# Patient Record
Sex: Female | Born: 1935
Health system: Southern US, Community
[De-identification: ages and names within clinical notes are randomized; demographics above are authoritative.]

## PROBLEM LIST (undated history)

## (undated) DIAGNOSIS — T8859XA Other complications of anesthesia, initial encounter: Secondary | ICD-10-CM

## (undated) DIAGNOSIS — M5136 Other intervertebral disc degeneration, lumbar region: Secondary | ICD-10-CM

## (undated) DIAGNOSIS — I4892 Unspecified atrial flutter: Secondary | ICD-10-CM

## (undated) DIAGNOSIS — K5790 Diverticulosis of intestine, part unspecified, without perforation or abscess without bleeding: Secondary | ICD-10-CM

## (undated) DIAGNOSIS — I10 Essential (primary) hypertension: Secondary | ICD-10-CM

## (undated) DIAGNOSIS — M545 Low back pain, unspecified: Secondary | ICD-10-CM

## (undated) DIAGNOSIS — M51369 Other intervertebral disc degeneration, lumbar region without mention of lumbar back pain or lower extremity pain: Secondary | ICD-10-CM

## (undated) DIAGNOSIS — M1710 Unilateral primary osteoarthritis, unspecified knee: Secondary | ICD-10-CM

## (undated) DIAGNOSIS — I350 Nonrheumatic aortic (valve) stenosis: Secondary | ICD-10-CM

## (undated) DIAGNOSIS — J42 Unspecified chronic bronchitis: Secondary | ICD-10-CM

## (undated) DIAGNOSIS — R609 Edema, unspecified: Secondary | ICD-10-CM

## (undated) DIAGNOSIS — I5032 Chronic diastolic (congestive) heart failure: Secondary | ICD-10-CM

## (undated) DIAGNOSIS — M1990 Unspecified osteoarthritis, unspecified site: Secondary | ICD-10-CM

## (undated) DIAGNOSIS — G4700 Insomnia, unspecified: Secondary | ICD-10-CM

## (undated) DIAGNOSIS — M179 Osteoarthritis of knee, unspecified: Secondary | ICD-10-CM

## (undated) DIAGNOSIS — R410 Disorientation, unspecified: Secondary | ICD-10-CM

## (undated) DIAGNOSIS — I499 Cardiac arrhythmia, unspecified: Secondary | ICD-10-CM

## (undated) DIAGNOSIS — R600 Localized edema: Secondary | ICD-10-CM

## (undated) DIAGNOSIS — R42 Dizziness and giddiness: Secondary | ICD-10-CM

## (undated) DIAGNOSIS — S3210XA Unspecified fracture of sacrum, initial encounter for closed fracture: Secondary | ICD-10-CM

## (undated) DIAGNOSIS — I493 Ventricular premature depolarization: Secondary | ICD-10-CM

## (undated) DIAGNOSIS — E7800 Pure hypercholesterolemia, unspecified: Secondary | ICD-10-CM

## (undated) DIAGNOSIS — K219 Gastro-esophageal reflux disease without esophagitis: Secondary | ICD-10-CM

## (undated) DIAGNOSIS — S7291XA Unspecified fracture of right femur, initial encounter for closed fracture: Secondary | ICD-10-CM

## (undated) DIAGNOSIS — K449 Diaphragmatic hernia without obstruction or gangrene: Secondary | ICD-10-CM

## (undated) DIAGNOSIS — T4145XA Adverse effect of unspecified anesthetic, initial encounter: Secondary | ICD-10-CM

## (undated) DIAGNOSIS — G8929 Other chronic pain: Secondary | ICD-10-CM

## (undated) HISTORY — PX: BREAST BIOPSY: SHX20

## (undated) HISTORY — DX: Other intervertebral disc degeneration, lumbar region: M51.36

## (undated) HISTORY — DX: Chronic diastolic (congestive) heart failure: I50.32

## (undated) HISTORY — PX: COLONOSCOPY: SHX174

## (undated) HISTORY — DX: Diverticulosis of intestine, part unspecified, without perforation or abscess without bleeding: K57.90

## (undated) HISTORY — DX: Localized edema: R60.0

## (undated) HISTORY — DX: Disorientation, unspecified: R41.0

## (undated) HISTORY — DX: Edema, unspecified: R60.9

## (undated) HISTORY — PX: CATARACT EXTRACTION W/ INTRAOCULAR LENS  IMPLANT, BILATERAL: SHX1307

## (undated) HISTORY — DX: Essential (primary) hypertension: I10

## (undated) HISTORY — PX: TOTAL KNEE ARTHROPLASTY: SHX125

## (undated) HISTORY — PX: UMBILICAL HERNIA REPAIR: SHX196

## (undated) HISTORY — DX: Dizziness and giddiness: R42

## (undated) HISTORY — DX: Unspecified atrial flutter: I48.92

## (undated) HISTORY — DX: Unilateral primary osteoarthritis, unspecified knee: M17.10

## (undated) HISTORY — DX: Other intervertebral disc degeneration, lumbar region without mention of lumbar back pain or lower extremity pain: M51.369

## (undated) HISTORY — DX: Nonrheumatic aortic (valve) stenosis: I35.0

## (undated) HISTORY — PX: EXCISIONAL HEMORRHOIDECTOMY: SHX1541

## (undated) HISTORY — DX: Ventricular premature depolarization: I49.3

## (undated) HISTORY — PX: COLECTOMY: SHX59

## (undated) HISTORY — DX: Diaphragmatic hernia without obstruction or gangrene: K44.9

## (undated) HISTORY — DX: Osteoarthritis of knee, unspecified: M17.9

## (undated) HISTORY — DX: Pure hypercholesterolemia, unspecified: E78.00

## (undated) HISTORY — PX: INGUINAL HERNIA REPAIR: SUR1180

## (undated) HISTORY — DX: Insomnia, unspecified: G47.00

---
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
## 1997-10-23 ENCOUNTER — Other Ambulatory Visit: Admission: RE | Admit: 1997-10-23 | Discharge: 1997-10-23 | Payer: Self-pay | Admitting: *Deleted

## 1997-11-12 ENCOUNTER — Ambulatory Visit (HOSPITAL_COMMUNITY): Admission: RE | Admit: 1997-11-12 | Discharge: 1997-11-12 | Payer: Self-pay | Admitting: *Deleted

## 1998-05-05 ENCOUNTER — Other Ambulatory Visit: Admission: RE | Admit: 1998-05-05 | Discharge: 1998-05-05 | Payer: Self-pay | Admitting: *Deleted

## 1998-12-03 ENCOUNTER — Other Ambulatory Visit: Admission: RE | Admit: 1998-12-03 | Discharge: 1998-12-03 | Payer: Self-pay | Admitting: *Deleted

## 1998-12-03 ENCOUNTER — Encounter (INDEPENDENT_AMBULATORY_CARE_PROVIDER_SITE_OTHER): Payer: Self-pay

## 1999-01-31 ENCOUNTER — Encounter: Payer: Self-pay | Admitting: Emergency Medicine

## 1999-01-31 ENCOUNTER — Inpatient Hospital Stay (HOSPITAL_COMMUNITY): Admission: EM | Admit: 1999-01-31 | Discharge: 1999-02-26 | Payer: Self-pay | Admitting: Emergency Medicine

## 1999-02-01 ENCOUNTER — Encounter: Payer: Self-pay | Admitting: Emergency Medicine

## 1999-02-02 ENCOUNTER — Encounter: Payer: Self-pay | Admitting: General Surgery

## 1999-02-06 ENCOUNTER — Encounter: Payer: Self-pay | Admitting: General Surgery

## 1999-02-11 ENCOUNTER — Encounter: Payer: Self-pay | Admitting: General Surgery

## 1999-02-14 ENCOUNTER — Encounter: Payer: Self-pay | Admitting: General Surgery

## 1999-02-16 ENCOUNTER — Encounter: Payer: Self-pay | Admitting: Urology

## 1999-05-12 ENCOUNTER — Other Ambulatory Visit: Admission: RE | Admit: 1999-05-12 | Discharge: 1999-05-12 | Payer: Self-pay | Admitting: *Deleted

## 1999-06-15 ENCOUNTER — Ambulatory Visit (HOSPITAL_COMMUNITY): Admission: RE | Admit: 1999-06-15 | Discharge: 1999-06-15 | Payer: Self-pay | Admitting: Gastroenterology

## 1999-09-24 ENCOUNTER — Encounter: Admission: RE | Admit: 1999-09-24 | Discharge: 1999-09-24 | Payer: Self-pay | Admitting: *Deleted

## 1999-09-24 ENCOUNTER — Encounter: Payer: Self-pay | Admitting: *Deleted

## 1999-09-29 ENCOUNTER — Encounter: Admission: RE | Admit: 1999-09-29 | Discharge: 1999-09-29 | Payer: Self-pay | Admitting: *Deleted

## 1999-09-29 ENCOUNTER — Encounter: Payer: Self-pay | Admitting: *Deleted

## 1999-11-02 ENCOUNTER — Ambulatory Visit (HOSPITAL_COMMUNITY): Admission: RE | Admit: 1999-11-02 | Discharge: 1999-11-02 | Payer: Self-pay | Admitting: General Surgery

## 1999-11-02 ENCOUNTER — Encounter (INDEPENDENT_AMBULATORY_CARE_PROVIDER_SITE_OTHER): Payer: Self-pay | Admitting: Specialist

## 2000-05-16 ENCOUNTER — Other Ambulatory Visit: Admission: RE | Admit: 2000-05-16 | Discharge: 2000-05-16 | Payer: Self-pay | Admitting: *Deleted

## 2000-10-18 ENCOUNTER — Encounter: Admission: RE | Admit: 2000-10-18 | Discharge: 2000-10-18 | Payer: Self-pay | Admitting: *Deleted

## 2000-10-18 ENCOUNTER — Encounter: Payer: Self-pay | Admitting: *Deleted

## 2000-10-20 ENCOUNTER — Encounter: Payer: Self-pay | Admitting: *Deleted

## 2000-10-20 ENCOUNTER — Encounter: Admission: RE | Admit: 2000-10-20 | Discharge: 2000-10-20 | Payer: Self-pay | Admitting: *Deleted

## 2001-02-17 ENCOUNTER — Ambulatory Visit (HOSPITAL_COMMUNITY): Admission: RE | Admit: 2001-02-17 | Discharge: 2001-02-17 | Payer: Self-pay | Admitting: Cardiology

## 2001-02-17 HISTORY — PX: CARDIAC CATHETERIZATION: SHX172

## 2001-05-25 ENCOUNTER — Other Ambulatory Visit: Admission: RE | Admit: 2001-05-25 | Discharge: 2001-05-25 | Payer: Self-pay | Admitting: *Deleted

## 2001-11-21 ENCOUNTER — Encounter: Admission: RE | Admit: 2001-11-21 | Discharge: 2001-11-21 | Payer: Self-pay | Admitting: *Deleted

## 2001-11-21 ENCOUNTER — Encounter: Payer: Self-pay | Admitting: *Deleted

## 2002-05-31 ENCOUNTER — Other Ambulatory Visit: Admission: RE | Admit: 2002-05-31 | Discharge: 2002-05-31 | Payer: Self-pay | Admitting: Obstetrics and Gynecology

## 2002-12-25 ENCOUNTER — Encounter: Admission: RE | Admit: 2002-12-25 | Discharge: 2002-12-25 | Payer: Self-pay | Admitting: *Deleted

## 2002-12-25 ENCOUNTER — Encounter: Payer: Self-pay | Admitting: *Deleted

## 2003-05-31 ENCOUNTER — Inpatient Hospital Stay (HOSPITAL_COMMUNITY): Admission: EM | Admit: 2003-05-31 | Discharge: 2003-06-04 | Payer: Self-pay | Admitting: Emergency Medicine

## 2003-06-03 ENCOUNTER — Encounter: Payer: Self-pay | Admitting: Cardiology

## 2003-06-13 ENCOUNTER — Other Ambulatory Visit: Admission: RE | Admit: 2003-06-13 | Discharge: 2003-06-13 | Payer: Self-pay | Admitting: *Deleted

## 2004-02-10 ENCOUNTER — Encounter: Admission: RE | Admit: 2004-02-10 | Discharge: 2004-02-10 | Payer: Self-pay | Admitting: Cardiology

## 2005-03-24 ENCOUNTER — Encounter: Admission: RE | Admit: 2005-03-24 | Discharge: 2005-03-24 | Payer: Self-pay | Admitting: Cardiology

## 2005-04-14 ENCOUNTER — Ambulatory Visit: Payer: Self-pay | Admitting: Physical Medicine & Rehabilitation

## 2005-04-14 ENCOUNTER — Inpatient Hospital Stay (HOSPITAL_COMMUNITY): Admission: RE | Admit: 2005-04-14 | Discharge: 2005-04-20 | Payer: Self-pay | Admitting: Orthopedic Surgery

## 2005-04-26 ENCOUNTER — Encounter: Admission: RE | Admit: 2005-04-26 | Discharge: 2005-04-26 | Payer: Self-pay | Admitting: Orthopedic Surgery

## 2005-06-23 ENCOUNTER — Other Ambulatory Visit: Admission: RE | Admit: 2005-06-23 | Discharge: 2005-06-23 | Payer: Self-pay | Admitting: Obstetrics and Gynecology

## 2005-07-21 ENCOUNTER — Encounter: Payer: Self-pay | Admitting: *Deleted

## 2005-07-21 ENCOUNTER — Encounter: Payer: Self-pay | Admitting: General Surgery

## 2005-08-02 ENCOUNTER — Encounter: Admission: RE | Admit: 2005-08-02 | Discharge: 2005-08-02 | Payer: Self-pay | Admitting: Obstetrics and Gynecology

## 2006-04-12 ENCOUNTER — Encounter: Admission: RE | Admit: 2006-04-12 | Discharge: 2006-04-12 | Payer: Self-pay | Admitting: Cardiology

## 2006-09-01 LAB — HM COLONOSCOPY

## 2007-05-01 ENCOUNTER — Encounter: Admission: RE | Admit: 2007-05-01 | Discharge: 2007-05-01 | Payer: Self-pay | Admitting: Cardiology

## 2007-07-21 ENCOUNTER — Other Ambulatory Visit: Admission: RE | Admit: 2007-07-21 | Discharge: 2007-07-21 | Payer: Self-pay | Admitting: Obstetrics and Gynecology

## 2008-02-01 ENCOUNTER — Encounter: Admission: RE | Admit: 2008-02-01 | Discharge: 2008-02-01 | Payer: Self-pay | Admitting: Orthopaedic Surgery

## 2008-05-16 ENCOUNTER — Encounter: Admission: RE | Admit: 2008-05-16 | Discharge: 2008-05-16 | Payer: Self-pay | Admitting: Obstetrics and Gynecology

## 2008-10-01 ENCOUNTER — Encounter: Admission: RE | Admit: 2008-10-01 | Discharge: 2008-10-01 | Payer: Self-pay | Admitting: Otolaryngology

## 2008-12-31 ENCOUNTER — Encounter: Admission: RE | Admit: 2008-12-31 | Discharge: 2008-12-31 | Payer: Self-pay | Admitting: Cardiology

## 2009-04-08 ENCOUNTER — Encounter: Admission: RE | Admit: 2009-04-08 | Discharge: 2009-04-08 | Payer: Self-pay | Admitting: Cardiology

## 2009-06-11 ENCOUNTER — Encounter: Admission: RE | Admit: 2009-06-11 | Discharge: 2009-06-11 | Payer: Self-pay | Admitting: Cardiology

## 2009-06-17 ENCOUNTER — Encounter: Admission: RE | Admit: 2009-06-17 | Discharge: 2009-06-17 | Payer: Self-pay | Admitting: Cardiology

## 2009-10-22 ENCOUNTER — Ambulatory Visit: Payer: Self-pay | Admitting: Cardiology

## 2009-10-27 ENCOUNTER — Ambulatory Visit: Payer: Self-pay | Admitting: Cardiology

## 2009-11-27 ENCOUNTER — Ambulatory Visit: Payer: Self-pay | Admitting: Cardiology

## 2010-02-02 ENCOUNTER — Ambulatory Visit: Payer: Self-pay | Admitting: Cardiology

## 2010-04-11 ENCOUNTER — Encounter: Payer: Self-pay | Admitting: Orthopedic Surgery

## 2010-05-05 ENCOUNTER — Other Ambulatory Visit: Payer: Self-pay | Admitting: Obstetrics and Gynecology

## 2010-05-05 DIAGNOSIS — Z780 Asymptomatic menopausal state: Secondary | ICD-10-CM

## 2010-05-05 DIAGNOSIS — M898X9 Other specified disorders of bone, unspecified site: Secondary | ICD-10-CM

## 2010-05-06 ENCOUNTER — Ambulatory Visit (INDEPENDENT_AMBULATORY_CARE_PROVIDER_SITE_OTHER): Payer: Medicare Other | Admitting: Cardiology

## 2010-05-06 DIAGNOSIS — E7800 Pure hypercholesterolemia, unspecified: Secondary | ICD-10-CM

## 2010-05-06 DIAGNOSIS — I2510 Atherosclerotic heart disease of native coronary artery without angina pectoris: Secondary | ICD-10-CM

## 2010-05-06 DIAGNOSIS — I119 Hypertensive heart disease without heart failure: Secondary | ICD-10-CM

## 2010-05-06 DIAGNOSIS — Z79899 Other long term (current) drug therapy: Secondary | ICD-10-CM

## 2010-05-12 ENCOUNTER — Ambulatory Visit
Admission: RE | Admit: 2010-05-12 | Discharge: 2010-05-12 | Disposition: A | Discharge status: Home / Self Care | Payer: PRIVATE HEALTH INSURANCE | Source: Ambulatory Visit | Attending: Obstetrics and Gynecology | Admitting: Obstetrics and Gynecology

## 2010-05-12 DIAGNOSIS — Z780 Asymptomatic menopausal state: Secondary | ICD-10-CM

## 2010-05-12 DIAGNOSIS — M898X9 Other specified disorders of bone, unspecified site: Secondary | ICD-10-CM

## 2010-06-11 ENCOUNTER — Other Ambulatory Visit: Payer: Self-pay | Admitting: *Deleted

## 2010-06-11 DIAGNOSIS — F419 Anxiety disorder, unspecified: Secondary | ICD-10-CM

## 2010-06-11 MED ORDER — ALPRAZOLAM 0.25 MG PO TABS: 0.2500 mg | ORAL_TABLET | Freq: Every evening | ORAL | Status: AC | PRN

## 2010-06-11 NOTE — Telephone Encounter (Signed)
Refilled meds per fax request.  

## 2010-07-07 ENCOUNTER — Other Ambulatory Visit: Payer: Self-pay | Admitting: Cardiology

## 2010-07-07 DIAGNOSIS — Z1231 Encounter for screening mammogram for malignant neoplasm of breast: Secondary | ICD-10-CM

## 2010-07-20 ENCOUNTER — Telehealth: Payer: Self-pay | Admitting: Cardiology

## 2010-07-20 ENCOUNTER — Ambulatory Visit
Admission: RE | Admit: 2010-07-20 | Discharge: 2010-07-20 | Disposition: A | Discharge status: Home / Self Care | Payer: Medicare Other | Source: Ambulatory Visit | Attending: Cardiology | Admitting: Cardiology

## 2010-07-20 DIAGNOSIS — Z1231 Encounter for screening mammogram for malignant neoplasm of breast: Secondary | ICD-10-CM

## 2010-07-20 DIAGNOSIS — E7800 Pure hypercholesterolemia, unspecified: Secondary | ICD-10-CM

## 2010-07-20 LAB — HM MAMMOGRAPHY: HM Mammogram: NORMAL

## 2010-07-20 MED ORDER — ATORVASTATIN CALCIUM 20 MG PO TABS: 20.0000 mg | ORAL_TABLET | Freq: Every day | ORAL | Status: DC

## 2010-07-20 NOTE — Telephone Encounter (Signed)
Printed per patient request. 

## 2010-07-20 NOTE — Telephone Encounter (Signed)
Patient would like to come by and pick up a written Rx for her Lipitor 20 mg.  She is thinking about changing pharmacies

## 2010-08-07 NOTE — Discharge Summary (Signed)
Glenda [name], Glenda [name] NO.:  [id number]   MEDICAL RECORD NO.:  [id number]          PATIENT TYPE:  INP   LOCATION:  1615                         FACILITY:  [facility]   PHYSICIAN:  Ollen Gross, M.D.    DATE OF BIRTH:  05/28/1924   DATE OF ADMISSION:  04/14/2005  DATE OF DISCHARGE:  04/20/2005                                 DISCHARGE SUMMARY   ADMISSION DIAGNOSES:  1.  Osteoarthritis right knee.  2.  Hypertension.  3.  Transient ischemic attack.  4.  Hiatal hernia.  5.  Diverticulosis.  6.  History of premature ventricular contractions.  7.  History of bronchitis.   DISCHARGE DIAGNOSES:  1.  Osteoarthritis right knee status post right total knee arthroplasty.  2.  Postoperative blood loss anemia.  3.  Postop hyponatremia, improved.  4.  Postop hypokalemia, improved.  5.  Hypertension.  6.  Transient ischemic attack.  7.  Hiatal hernia.  8.  Diverticulosis.  9.  History of premature ventricular contractions.  10. History of bronchitis.   PROCEDURE:  April 14, 2005 right total knee, surgeon Dr. Lequita Halt,  assistant Avel Peace, PA-C. Anesthesia general with postop Marcaine pain  pump.   CONSULTATIONS:  Rehab, cardiology social visit per Dr. Patty Sermons.   BRIEF HISTORY:  Glenda [name] is a 77 year old female whose gone on to develop  significant medical compartment arthritis. Dr. Charlann Boxer has performed 2  arthroscopies in the past but persistent pain. She has also undergone  multiple injections including viscosupplementation. Unfortunately she  developed increasing pain and now presents for total knee.   LABORATORY DATA:  Preop CBC, hemoglobin 13.5, hematocrit of 39.0, white cell  count 4.8. Postop hemoglobin 9.7 and drifted down to 8.9, came back up to  9.2, drifted back down to 8.7 and was last noted at 8.8 and 25.3  respectively, hemoglobin and hematocrit. Preop PT and PTT on admission 13.6  and 30 respectively with an INR of 1.0. Serial pro times followed.  Last  noted PT/INR 26.0 and 2.4. Chem panel on admission showed somewhat of a low  sodium of 130, remaining chem panel within normal limits. Serial BMETs are  followed. Sodium dropped further dropped down to 129 and then down to 120,  came back up to 129, stabilized at 128. Potassium dropped from 5.0 down to  3.2, back up to 4.5. Glucose went up from 88 to 165, back down to 100.  Potassium dropped from 9.1 to 7.6 back up to 8.3. Urinalysis preop negative,  blood group type O negative. Two view chest on April 08, 2005, no active  disease, mild prominence of heart size. This may be somewhat accentuated by  underlying  pectus deformity of the lower chest wall.   HOSPITAL COURSE:  The patient was admitted to [facility], taken to  the OR,  underwent the above stated procedure, tolerated it well, later  transferred to the recovery room and then to the orthopedic floor for  continued postoperative care. She was seen socially by Dr. Patty Sermons  postoperatively. The patient was doing well but was having some pain. Had a  little bit of sore throat from her endotracheal tube. She had slight  decrease in her sodium down to 129, changed the fluids and decreased the  rate, thought it would be some dilutional. She was on Maxzide, given IV  Lasix due to positive volume overload. Hemoglobin was down to 9.7. Started  getting up with physical therapy. By day 2, she was actually doing better  sodium although had dropped further down to 121, hemoglobin down to 8.9,  dressing was changed. Fluids were discontinued. She was on K-Dur for the low  potassium which was down to 3.2. Started getting up with physical therapy,  just pivot and transferring. By day 3, she was starting to turn the corner  and improving. She had been seen by rehab services and felt that she would  possibly need inpatient SACU stay versus home health. It would depend upon  how well she progressed. Labs are followed, the hemoglobin  was rechecked and  she had stabilized at 8.7. Sodium had come back up however to 129 and also  her potassium improved with potassium supplements. She started doing a  little better with physical therapy. She was up ambulating approximately 50  feet. Dressing changed daily. Incision was healing well. She continued to  receive daily therapy. By April 19, 2005 which is postop day 5, she was  actually ambulating approximately 100 feet with a rolling walker. It was  felt that she did not require inpatient rehab [facility]U  at this point since she  had progressed well with her physical therapy. Discharge planning started  making arrangements for home care. She was seen on the following rounds on  April 20, 2005 doing well. Arrangements were made for home. She was  discharged at that time.   PLAN:  1.  The patient was discharged home on April 20, 2005.  2.  Discharge diagnoses please see above.  3.  Discharge meds:  Trinsicon, Coumadin, Vicodin, Robaxin.  4.  Diet - diverticular diet.   FOLLOW UP:  Two weeks from date of surgery, call the office for an  appointment at [phone].   DISPOSITION:  Home.   ACTIVITY:  She is weightbearing as tolerated to the right lower extremity.  Home health PT, home health nursing. Total knee protocol.   CONDITION ON DISCHARGE:  Improved.      Alexzandrew L. Julien Girt, P.A.      Ollen Gross, M.D.  Electronically Signed    ALP/MEDQ  D:  05/24/2005  T:  05/25/2005  Job:  [id number]   cc:   Rehab Services   Cassell Clement, M.D.  Fax: [phone]

## 2010-08-07 NOTE — Op Note (Signed)
NAME:  Glenda [name], Glenda [name]                        ACCOUNT NO.:  [id number]   MEDICAL RECORD NO.:  [id number]                   PATIENT TYPE:  INP   LOCATION:  3027                                 FACILITY:  [facility]   PHYSICIAN:  Gustavus Messing. Orlin Hilding, M.D.          DATE OF BIRTH:  Mar 19, 1925   DATE OF PROCEDURE:  05/31/2003  DATE OF DISCHARGE:                                 OPERATIVE REPORT   INDICATION:  Rule out subarachnoid hemorrhage.   The patient is a 77 year old who came in with the worst headache of her life  and some diplopia.  CT was negative.  Lumbar puncture is being done to rule  out subarachnoid hemorrhage.   After consent obtained following description of the event including risks of  headache and benefits of potential diagnosis, the patient was sterilely  prepped and draped, positioned in the usual fashion in the left lateral  decubitus position, locally anesthetized with 1% Xylocaine.  The L3-L4 area  was entered with minimal difficulty with clear colorless fluid under an  opening pressure of 17.  Removed was approximately 6 cc and were sent to the  laboratory for cell count and differential in tube 2 and 4, protein,  glucose, and VDRL in tube 1, Gram stain, cryptococcal antigen, bacterial  culture in tube 3.  The patient tolerated the procedure well.  She is to lie  flat for half an hour and then may be sat up again.                                               Catherine A. Orlin Hilding, M.D.    CAW/MEDQ  D:  05/31/2003  T:  06/02/2003  Job:  [id number]

## 2010-08-07 NOTE — Discharge Summary (Signed)
NAME:  Glenda [name], Glenda [name]                        ACCOUNT NO.:  [id number]   MEDICAL RECORD NO.:  [id number]                   PATIENT TYPE:  INP   LOCATION:  3027                                 FACILITY:  [facility]   PHYSICIAN:  Naveen Lorusso doctor                      DATE OF BIRTH:  [birth year]   DATE OF ADMISSION:  05/31/2003  DATE OF DISCHARGE:  06/04/2003                                 DISCHARGE SUMMARY   DISCHARGE DIAGNOSES:  1. Transient ischemic attack.  2. Hypertension.  3. Diastolic dysfunction.  4. Premature ventricular contractions.  5. Diverticulitis.  6. Status post colon resection, in 2001, for partial blockage secondary to     diverticulitis.  7. Status post cesarean section.   DISCHARGE MEDICATIONS:  1. Aspirin 325 mg daily.  2. Toprol 50 mg at bedtime.  3. Lisinopril 10 mg daily.   STUDIES PERFORMED:  1. CT on admission shows mild diffuse parenchymal atrophy without bleed or     other acute intracranial process.  2. MRI of the brain is negative for acute stroke but some ischemic changes     of the pons, particularly of the left pons.  3. MRA of the brain unremarkable.  4. A 2-D echocardiogram shows ejection fraction of 55-65%, with no wall     abnormalities, and no left ventricular wall abnormalities, no embolic     source, per Dr. Patty Sermons.  There was some evidence of some diastolic     dysfunction secondary to prior hypertension.  5. Carotid Doppler shows no ICA stenosis bilaterally, vertebral artery flow     antegrade.  6. Transcranial Doppler performed, results pending.  7. EKG:  Not performed during this hospitalization, but telemetry showing     SVT and ventricular trigemini at some points.   LABORATORY STUDIES:  VDRL nonreactive.  CSF with no growth.  ANA negative.  Sodium 135, potassium 4.3, chloride 102, carbon dioxide 27, glucose 102, BUN  28, creatinine 0.8, calcium 8.7.  Coagulation studies normal.  CBC normal  with hemoglobin slightly up at 15.2.   Liver function tests are normal.  Homocystine normal.  Lipid profile:  Cholesterol 180, triglycerides 58, HDL  59, LDL 109.  Urine drug screen was positive for opiates, otherwise  negative.  CSF:  Protein 33, glucose 53, fluid was clear and colorless, no  growth, white blood cells present predominantly, mononuclear.  Cryptococcal  antigen was negative.   HISTORY OF PRESENT ILLNESS:  Glenda [name] is a 77 year old right handed white  female, has a history of blurred vision and headache x3 days.  Headache was  worst in her life, and she had periorbital pain.  She had diplopia on the  left and was seen by Dr. Dagoberto Ligas who felt she had an acute __________  and sent her to the emergency room for evaluation.  The patient was admitted  for stroke workup by neurology.  She was not a TPA candidate secondary to  time and symptoms.   HOSPITAL COURSE:  It was felt the patient probably had a subacute left  pontine infarction, but DWI remained negative and symptoms cleared, leaving  her with a diagnosis of transient ischemic attack.  A full stroke workup was  done for prevention with the risk of stroke high within the first two weeks  after a TIA.  Dr. Patty Sermons was asked to consult for episodes of irregular  heart beat and PVCs.  She does have a history of nonsustained ventricular  tachycardia.  Dr. Patty Sermons looked at echo, and felt she may be had some  diastolic dysfunction, and recommended Lisinopril added to her normal dose  of Toprol.  He will follow her up as an outpatient as will Dr. Pearlean Brownie.  No  other stroke risk factors identified.  The patient will be placed on aspirin  for secondary prevention.   CONDITION ON DISCHARGE:  The patient is alert and oriented x3.  Speech  clear.  No aphasia.  She is neurologically without deficits.   DISCHARGE PLAN:  1. Discharge home with husband.  2. Aspirin 325 mg for secondary stroke prevention.  3. Lisinopril added to Toprol per Dr. Patty Sermons.  4.  Followup with Dr. Patty Sermons in one month.  Dr. Patty Sermons to followup B-     MET and mag drawn day of discharge.  5. Followup with Dr. Pearlean Brownie in two months, call for an appointment.      Annie Main, N.P.                         Danicia Terhaar doctor    SB/MEDQ  D:  06/04/2003  T:  06/05/2003  Job:  [id number]   cc:   Pramod P. Pearlean Brownie, MD  Fax: [phone]   Cassell Clement, M.D.  [address]  [place]  [place] [address]  Fax: [phone]   Richarda Overlie, M.D.  [address] [place]  [place] [address]  Fax: [phone]   Harlin Heys, Dr.  Ginette Otto, [place]

## 2010-08-07 NOTE — Op Note (Signed)
NAMEMEGHNA, HAGMANN NO.:  [id number]   MEDICAL RECORD NO.:  [id number]          PATIENT TYPE:  INP   LOCATION:  0001                         FACILITY:  [facility]   PHYSICIAN:  Ollen Gross, M.D.    DATE OF BIRTH:  [birth year]   DATE OF PROCEDURE:  DATE OF DISCHARGE:                                 OPERATIVE REPORT   Audio too short to transcribe (less than 5 seconds)      Ollen Gross, M.D.     FA/MEDQ  D:  04/14/2005  T:  04/14/2005  Job:  [id number]

## 2010-08-07 NOTE — H&P (Signed)
NAMEJANICE, [name] NO.:  [id number]   MEDICAL RECORD NO.:  [id number]           PATIENT TYPE:   LOCATION:                                 FACILITY:   PHYSICIAN:  Ollen Gross, M.D.         DATE OF BIRTH:   DATE OF ADMISSION:  04/14/2005  DATE OF DISCHARGE:                                HISTORY & PHYSICAL   DATE OF OFFICE VISIT/HISTORY AND PHYSICAL:  April 01, 2005.   CHIEF COMPLAINT:  Right knee pain.   HISTORY OF PRESENT ILLNESS:  The patient is a 77 year old female.  She has  been seen by Dr. Lequita Halt for ongoing right knee pain.  She has known  significant arthritis.  She has been treated conservatively in the past,  most recent with Hyalgan  injections.  She has had some surgery in the past  in the form of arthroscopy with chondroplasty and synovectomy.  But has been  treated conservatively recently.  Unfortunately despite measures, the  patient has not improved with treatment.  It is felt she has reached a point  where she could benefit from surgical intervention.  Risks and benefits have  been discussed.  The patient is subsequent admitted to the hospital.   ALLERGIES:  1.  SULFA causes itching and burning.  2.  TALWIN causes hallucinations.  3.  TYLOX causes hallucinations.  Please note the patient is able to take Vicodin.   CURRENT MEDICATIONS:  1.  MiraLax daily.  2.  Calcium daily.  3.  Fosamax weekly.  4.  Ecotrin 325 mg daily.  5.  Triamterene/hydrochlorothiazide 37.5/25, half tablet daily.  6.  Lisinopril 20 mg half tablet daily.  7.  Toprol XL 50 mg daily.   PAST MEDICAL HISTORY:  1.  History of TIA.  2.  Hypertension.  3.  Hiatal hernia.  4.  Diverticulosis.  5.  History of PVCs.  6.  History of bronchitis.   PAST SURGICAL HISTORY:  1.  Colectomy secondary to blockage.  2.  Hernia repair.  3.  Two knee surgeries.   SOCIAL HISTORY:  Married, housewife, nonsmoker, no alcohol, two children.  Husband will be assisting with  care after surgery.   FAMILY HISTORY:  Significant for heart disease and hypertension.   REVIEW OF SYSTEMS:  GENERAL:  No fever, chills, night sweats.  NEURO:  History of TIA.  No seizures, syncope.  RESPIRATORY:  No shortness of  breath, productive cough, or hemoptysis.  CARDIOVASCULAR:  No chest pain,  angina, orthopnea.  She does have a history of PVCs.  GI:  Some  constipation.  No diarrhea.  No nausea, vomiting.  No blood or mucus in the  stool.  GU:  No dysuria, hematuria, discharge.  MUSCULOSKELETAL:  Right  knee.   PHYSICAL EXAMINATION:  VITAL SIGNS:  Pulse 56, respirations 12, .  GENERAL:  A 77 year old, white female, well nourished, well developed, no  acute distress.  She is, at the time of exam, very pleasant.  She is alert,  oriented, cooperative.  HEENT:  Normocephalic, atraumatic.  Pupils round and reactive.  Oropharynx  clear.  EOMs intact.  NECK:  Supple.  No bruits.  CHEST:  Clear anterior posterior chest walls.  No rhonchi, rales, or  wheezing.  HEART:  Regular rhythm.  A 2/6 ejection murmur, best heard at the pulmonic  and Erb point.  ABDOMEN:  Soft, nontender.  Bowel sounds present.  RECTAL/BREASTS/GENITALIA:  Not done.  Not pertinent to present illness.  EXTREMITIES:  Right knee range of motion at 5 to 120 degrees, varus  deformity, moderate crepitus noted.   IMPRESSION:  1.  Osteoarthritis, right knee.  2.  Hypertension.  3.  Transient ischemic attack.  4.  Hiatal hernia.  5.  Diverticulosis.  6.  History of premature ventricular contractions.  7.  History of bronchitis.   PLAN:  The patient will be admitted to [facility] to undergo a  right total knee arthroplasty.  Surgery will be performed by Dr. Ollen Gross.  Her cardiologist is Dr. Patty Sermons who will be notified of the room  number on admission and be consulted if need for medical assistance with the  patient throughout the hospital course.      Alexzandrew L. Julien Girt,  P.A.      Ollen Gross, M.D.  Electronically Signed    ALP/MEDQ  D:  04/13/2005  T:  04/14/2005  Job:  [id number]   cc:   Cassell Clement, M.D.  Fax: [phone]   Ollen Gross, M.D.  Fax: [phone]

## 2010-08-07 NOTE — Consult Note (Signed)
NAME:  Glenda [name], Glenda [name]                        ACCOUNT NO.:  [id number]   MEDICAL RECORD NO.:  [id number]                   PATIENT TYPE:  INP   LOCATION:  3027                                 FACILITY:  [facility]   PHYSICIAN:  Vesta Mixer, M.D.              DATE OF BIRTH:  [birth year]   DATE OF CONSULTATION:  06/02/2003  DATE OF DISCHARGE:                                   CONSULTATION   REFERRING PHYSICIAN:  Dr. Gustavus Messing. [place].   REASON FOR CONSULTATION:  Ms. Fitzhenry is a 77 year old female with a history  of palpitations and premature ventricular contractions.  She was admitted to  the hospital with a small pontine stroke.  We are asked to evaluate her for  some episodes of nonsustained ventricular tachycardia.   The patient has a long history of premature ventricular contractions.  She  has been treated with Toprol for many years with fairly good success.  She  has been on a higher dose of Toprol in the past, but this caused her to be  bradycardic and have lots of fatigue.  She has been on the current dose for  several years.  She has frequent episodes of PVCs and occasionally has very  bad episodes which leave her to be fatigued and worn out in the morning.  Last night she had one of these episodes and on the monitor was noted to  have a brief episode of nonsustained ventricular tachycardia.   CURRENT MEDICATIONS:  1. Aspirin 325 mg a day.  2. Toprol-XL 50 mg a day.  3. Restoril once a day.   ALLERGIES:  None.   PAST MEDICAL HISTORY:  1. Premature ventricular contractions.  2. Normal coronary arteries by heart catheterization several years ago.  3. Normal left ventricular systolic function.   SOCIAL HISTORY:  The patient is a nonsmoker.   FAMILY HISTORY:  Her family history is noncontributory.   REVIEW OF SYSTEMS:  Her review of systems was reviewed and is essentially  negative.   PHYSICAL EXAMINATION:  GENERAL:  On exam, she is an elderly female in no  acute  distress.  She has no chest pain and no dyspnea.  VITAL SIGNS:  Her temperature is 97.7, heart rate is 56, blood pressure is  165/75.  HEENT/NECK:  Her HEENT exam reveals 2+ carotids.  She has no bruits.  There  is no JVD and no thyromegaly.  LUNGS:  Lungs are clear to auscultation.  HEART:  Regular rate, S1 and S2.  She has a 2/6 systolic ejection murmur at  the left sternal border.  ABDOMEN:  Her abdominal exam reveals the bowel sounds to be nontender.  EXTREMITIES:  She has no clubbing, cyanosis or edema.  NEUROLOGIC:  Exam was nonfocal.   LABORATORY AND ACCESSORY CLINICAL DATA:  His potassium is 3.6.   Her telemetry strips revealed frequent premature ventricular contractions  last night.  She had a  brief run of nonsustained ventricular tachycardia.   ASSESSMENT AND PLAN:  Nonsustained ventricular tachycardia.  The patient was  basically asymptomatic.  She did feel the palpitations but she denies any  chest pain, shortness of breath, syncope or presyncope.  She has normal  coronary arteries and has a normal left ventricle by heart catheterization.  She does have a systolic murmur and probably has some degree of mitral valve  prolapse and mitral regurgitation.  We will continue with our current dose  of Toprol, since she is already at an optimal level of beta blockade.  I  think that if we try to increase her dose that she will have more  bradycardia.  We will also replace her potassium, which should help.  Dr.  Cassell Clement will follow after today.                                               Vesta Mixer, M.D.    PJN/MEDQ  D:  06/02/2003  T:  06/04/2003  Job:  [id number]   cc:   Cassell Clement, M.D.  [address], [address]  [place]  [place] [address]  Fax: [phone]   Gustavus Messing. Orlin Hilding, M.D.  [address]  [address]  [place]  [place] [address]  Fax: [phone]

## 2010-08-07 NOTE — Op Note (Signed)
[name], [name] NO.:  [id number]   MEDICAL RECORD NO.:  [id number]          PATIENT TYPE:  INP   LOCATION:  0001                         FACILITY:  [facility]   PHYSICIAN:  Ollen Gross, M.D.    DATE OF BIRTH:  1925-01-23   DATE OF PROCEDURE:  04/14/2005  DATE OF DISCHARGE:                                 OPERATIVE REPORT   PREOPERATIVE DIAGNOSIS:  Osteoarthritis right knee.   POSTOPERATIVE DIAGNOSIS:  Osteoarthritis right knee.   PROCEDURE:  Right total knee arthroplasty.   SURGEON:  Ollen Gross, M.D.   ASSISTANT:  Avel Peace, PA-C.   ANESTHESIA:  General with postop Marcaine pain pump.   ESTIMATED BLOOD LOSS:  Minimal.   DRAINS:  Hemovac x1.   TOURNIQUET TIME:  39 minutes at 300 mmHg.   COMPLICATIONS:  None.   CONDITION:  Stable to recovery.   CLINICAL NOTE:  Glenda [name] is a 77 year old female who has gone on to  develop significant medial compartment arthritis. Dr. Charlann Boxer had performed two  arthroscopies and she had developed persistent pain. She also has had  multiple injections including Viscosupplementation. Unfortunately she has  gone on to develop progressively worsening pain and presents now for total  knee arthroplasty.   PROCEDURE IN DETAIL:  After successful administration of general anesthetic,  a tourniquet was placed high on the right thigh and right lower extremity is  prepped and draped in the usual sterile fashion. The extremity is wrapped in  Esmarch, knee flexed and tourniquet inflated to 300 mmHg. A midline incision  is made with a 10 blade through the subcutaneous tissue to the level of the  extensor mechanism. A fresh blade is used to make a medial parapatellar  arthrotomy and the soft tissue over the proximal medial tibia is  subperiosteally elevated to the joint line with a knife and into the  semimembranosus bursa with a Cobb elevator. The soft tissue over the  proximal lateral tibia is also elevated with attention  being paid to  avoiding the patellar tendon on the tibial tubercle. The patella is everted,  knee flexed 90 degrees and ACL and PCL removed. A drill was used to create a  starting hole in the distal femur and the canal is irrigated. A 5 degree  right valgus alignment guide is placed and referencing off the posterior  condyles rotation is marked and the block pinned to remove 10 mm off the  distal femur. Distal femoral resection is made with an oscillating saw. The  sizing block is placed and the size 2.5 is the most appropriate. Rotation is  marked off the epicondylar axis and then the size 2.5 cutting block is  placed. The anterior, posterior and chamfer cuts are made.   The tibia is subluxed forward and the menisci are removed. The  extramedullary tibial alignment guide is placed referencing proximally at  the medial aspect of the tibial tubercle and distally along the second  metatarsal axis and tibial crest. The block is pinned to remove 10 mm off  the non deficient lateral side. Tibial resection is made with an oscillating  saw and then a size 2.5 is the most appropriate tibial component. The  proximal tibia is then prepared with the modular drill and keel punch for  the size 2.5. Femoral preparation is completed with the intercondylar cut.   A size 2.5 mobile bearing tibial trial with a 2.5 posterior stabilized  femoral trial  and a10 mm posterior stabilized rotating platform insert  trial are placed. With the 10, full extension is achieved with excellent  varus and valgus balance throughout full range of motion. The patella is  then everted and thickness measured to the 21 mm. Freehand resection is  taken to 12 mm, a 35 template is placed, lug holes are drilled, trial  patella is placed and it tracks normally. Osteophytes are removed off the  posterior femur with the trial in place. All trials are removed and the cut  bone surfaces prepared with pulsatile lavage. The cement is  mixed and once  ready for implantation the size 2.5 mobile bearing tibial tray, size 2.5  posterior stabilized femur and 35 patella are cemented into place and the  patella is held with the clamp. A trial 10 mm insert is placed, knee held in  full extension and all extruded cement removed. Once the cement is fully  hardened then the permanent 10 mm posterior stabilized rotating platform  insert is placed into the tibial tray. The wound is copiously irrigated with  saline solution and the extensor mechanism closed over a Hemovac drain with  interrupted #1 PDS. Flexion against gravity is 135 degrees. The tourniquets  is released with a total time of 39 minutes. Subcu is closed with  interrupted 2-0 Vicryl, subcuticular with running 4-0 Monocryl. The hemovac  is hooked to suction. The catheter for the Marcaine pain pump is placed and  the pump is initiated. A bulky sterile dressing is applied and she is placed  into a knee immobilizer, awakened and transported to recovery in stable  condition.      Ollen Gross, M.D.  Electronically Signed     FA/MEDQ  D:  04/14/2005  T:  04/14/2005  Job:  [id number]

## 2010-08-07 NOTE — Op Note (Signed)
[facility]  Patient:    Glenda [name], Glenda [name]                     MRN: [id number] Proc. Date: 11/02/99 Adm. Date:  [id number] Attending:  Carson Myrtle                           Operative Report  PREOPERATIVE DIAGNOSIS:  Left inguinal hernia.  POSTOPERATIVE DIAGNOSIS:  Left direct and indirect inguinal hernia.  OPERATION PERFORMED:  Repair of hernia with mesh.  SURGEON:  Kendrick Ranch, M.D.  ANESTHESIA:  General.  HISTORY OF PRESENT ILLNESS:  Ms. Kozakiewicz is an otherwise healthy 77 year old Caucasian female who I have seen and treated in the past who presents with an increasingly enlarging uncomfortable left inguinal hernia and she wishes now to have a repair done.  DESCRIPTION OF PROCEDURE:  The patient was brought to the operating room, placed supine, general endotracheal anesthesia administered. The groin was shaved, prepped and draped in the usual fashion. The left groin was approached through a standard horizontal incision over the palpable defect after injection of 0.25% Marcaine with epinephrine through all levels of the dissection. The subcutaneous tissue dissected, bleeding points carefully cauterized, external oblique identified, dissected free, opened in line with the fibers to the external ring. A moderate indirect hernia sac accompanied the round ligament down the canal. The ilioinguinal nerve accompanied that. This was bluntly dissected off the hernia and kept lateral. The hernia sac was dissected from the inguinal contents up to the neck at the internal ring. It was ligated with the 2-0 Prolene, excess sac cut away and submitted for pathology. The round ligament was divided at the internal ring as well and suture ligated with a 2-0 Prolene. This allowed me to close the internal ring completely with a figure-of-eight suture of 2-0 Prolene. The floor of the canal was quite relaxed and redundant. A piece of mesh was cut in fashion to fit  that and sutured in placed between the ilioinguinal ligament laterally and the transversalis fascia medially.  This completed the repair. The external oblique was closed with a running 2-0 Vicryl, deep subcu 2-0 Vicryl and skin with 3-0 monocryl. The counts were correct. Steri-Strips and a dry sterile dressing applied. The patient tolerated the procedure well and was extubated and taken to the recovery room in good condition.  Written and verbal instructions were given to her and her family including Darvocet-N 100 mg #24 and she will be seen and followed as an outpatient. DD:  11/02/99 TD:  11/02/99 Job: [id number]

## 2010-08-07 NOTE — H&P (Signed)
NAME:  Glenda [name], Glenda [name]                        ACCOUNT NO.:  [id number]   MEDICAL RECORD NO.:  [id number]                   PATIENT TYPE:  EMS   LOCATION:  MAJO                                 FACILITY:  [facility]   PHYSICIAN:  Gustavus Messing. Orlin Hilding, M.D.          DATE OF BIRTH:  May 25, 1924   DATE OF ADMISSION:  05/31/2003  DATE OF DISCHARGE:                                HISTORY & PHYSICAL   CHIEF COMPLAINT:  Severe headache, left sixth nerve palsy.   HISTORY OF PRESENT ILLNESS:  Glenda [name] is a 77 year old fairly healthy,  white woman generally not having any difficulties.  She has had some recent  UTIs and URIs, recently been on antibiotics off and on.  The day before  yesterday, she began having severe headache which she would consider the  worst in her life, nonspecific blurring of vision, left periorbital pain.  This morning she developed diplopia to the left.  She saw Dr. Mckinley Jewel this morning who felt that she had a left sixth nerve palsy and  sent her to the emergency room.   REVIEW OF SYSTEMS:  Negative for weakness or numbness.  She did have some  nausea without vomiting.  No stiff neck, no speech, language, or swallowing  problems.   PAST MEDICAL HISTORY:  1. PVCs, irregular heart beat for which she takes Toprol.  2. She has had diverticulitis.  3. Required a colon resection for some kind of obstruction related to the     diverticulitis.  4. She has had hernia repair.  5. C-section.   MEDICATIONS:  1. Toprol 50 mg one a day.  2. Fosamax 70 mg weekly.  3. MiraLax p.r.n.  4. Aspirin 81 mg a day.  5. She also takes calcium.  6. She was on a Z-Pak but has completed that.   ALLERGIES:  1. SULFA.  2. TALWIN.  3. TYLOX.   SOCIAL HISTORY:  She is married.  No significant alcohol use.  She is a  housewife.   FAMILY HISTORY:  Negative for stroke.   PHYSICAL EXAMINATION:  VITAL SIGNS:  Temperature is 98.6, pulse 75,  respirations 15.  Initial blood pressure  is 195/95, has not been repeated  yet.  HEENT:  Head is normocephalic, atraumatic.  NECK:  Supple without rigidity.  HEART:  Regular rate and rhythm.  LUNGS:  Clear to auscultation.'  ABDOMEN:  Soft.  EXTREMITIES:  Without edema.  NEUROLOGIC:  Mental status:  She is awake, alert, normal language, and  orientation.  Cranial nerves:  Pupils are equal and reactive.  Visual fields  are full.  Extraocular movements are intact objectively to me.  She does  develop double vision when she looks to her left.  Facial sensation is  normal.  Facial motor activity is intact.  Hearing is intact.  Palate is  symmetric and tongue is midline.  Motor:  She has no drift or satellite.  Normal bulk,  tone, and strength, 5/5 strength throughout.  Reflexes are 2+  and symmetric.  She has an upgoing toe on the right, downgoing toe on the  left.  Coordination:  Finger-to-nose and heel-to-shin are normal.  I did not  ambulate her, since her exam is intact.   CT scan of the brain was unremarkable.   Lumbar puncture was normal.  Opening pressure of 17.  Fluid is clear and  colorless.  The lab work has not returned on that.   MRI scan of the brain showed faintly positive diffusion weighted image in  the left mid pons which also showed up on the flare and the T2.  She also  had minimal small vessel changes.  MR angiogram looked essentially normal  without evidence of aneurism.   Labs are fairly unremarkable.  Potassium 3.6, sodium 135, chloride 98, CO2  28, BUN 11, creatinine 0.8, glucose 104.   IMPRESSION:  Subacute left pons infarction, weakly positive on diffusion  weighted image T2 and flare.  __________  would for left sixth nerve palsy,  right upgoing toe, though otherwise she is intact.   PLAN:  Discussed it with Dr. Pearlean Brownie.  We will admit her for completion of the  stroke workup, increase her aspirin to 325 mg daily.                                                Catherine A. Orlin Hilding, M.D.     CAW/MEDQ  D:  05/31/2003  T:  06/02/2003  Job:  [id number]   cc:   Richarda Overlie, M.D.  [address] [place]  [place] [address]  Fax: [phone]   Cassell Clement, M.D.  [address], [address]  [place]  [place] [address]  Fax: [phone]

## 2010-08-07 NOTE — H&P (Signed)
[place]  Patient:    Glenda [name], Glenda [name] Visit Number: [id number] MRN: [id number]          Service Type: Attending:  Darden Palmer., M.D. Dictated by:   Darden Palmer., M.D. Adm. Date:  02/17/01   CC:         Maisie Fus A. Patty Sermons, M.D.                         History and Physical  REASON FOR ADMISSION:  For catheterization.  HISTORY:  The patient is a 77 year old female with a long history of PVCs that have been present for multiple years.  She has been treated with beta blockers as well as Lanoxin and has been demonstrated to have PACs also in the past. She has tended to be anxious and has had a positive family history of heart disease with chest pain.  She had a stress Cardiolite on October 04, 2000 showing normal wall motion, normal ejection fraction of 68%, and no ischemia.  She has noted an increasing frequency of palpitations and skipping to the point that she has been extremely bothered by them and cannot sleep at night, and has had to use Ambien.  In association with this she has had a constant-type chest pain described as a heaviness or tightness that is better in the morning but will get worse as the day goes on and is described as being constant.  It is not definitely worse with exertion.  It is does not sound anginal in nature, but she has been extremely bothered with the chest pressure and tightness, and her symptoms have escalated to the point where she is extremely incapacitated by them.  Dr. Patty Sermons had asked [name] to do a catheterization to exclude significant coronary disease in view of her incapacitation with her symptoms and determine if coronary disease is responsible for her chest pain syndrome and PVCs.  Her symptoms also are described as being associated with a "ping" sensation in the chest and history of premature heart beats.  Her last echo was in 1999 showing mild mitral regurgitation with a good LV function.   She was recently diagnosed with hypertension.  PAST MEDICAL HISTORY:  Remarkable for mild hypertension.  There is no history of diabetes, no ulcer disease.  PREVIOUS SURGERY:  Cesarean section, hemorrhoidectomy, umbilical hernia, left breast biopsy, colon surgery for diverticulitis by Dr. Earlene Plater a year or so ago, right and left inguinal hernia repairs.  ALLERGIES:  SULFA, TALWIN, HALCION, and CORGARD.  CURRENT MEDICATIONS: 1. Diovan 80 mg daily. 2. Inderal LA 80 mg daily. 3. Aspirin 81 mg daily. 4. Lanoxin 0.25 daily. 5. Fosamax 70 mg weekly. 6. Ambien q.h.s. p.r.n.  FAMILY HISTORY:  Father and mother died accidentally at age 70 and 15.  She has two sisters - one died of cancer of the stomach, one sister has had coronary disease with a stent placed.  She has a son and a daughter by previous marriage who are healthy.  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  She is a nonsmoker and does not use alcohol to excess. Attends [employer].  She and her husband have been married 39 years.  REVIEW OF SYSTEMS:  She has had difficulty with seborrhea and occasional moles removed from her skin.  She has had no abnormal weight loss.  She sleeps poorly at night and tends to be  quite anxious.  She has difficulty with constipation.  She sees Dr. Carman Ching.  She has a history of severe diverticulitis in the past and was recently diagnosed with hypertension.  She has no claudication and has no urinary symptoms.  She does have some shoulder symptoms and sees Dr. Darrelyn Hillock for this.  Remainder of the review of systems is unremarkable except as noted above.  PHYSICAL EXAMINATION:  GENERAL:  She is a pleasant woman appearing stated age.  She appears anxious.  VITAL SIGNS:  Her weight is 124.5.  Her blood pressure is 150/80 sitting, 11914 standing.  Pulse 68 and irregular.  SKIN:  Warm and dry.  HEENT:  EOMI, PERRLA.  C&S clear.   Fundi unremarkable.  Pharynx negative.  NECK:  Supple without masses.  There is no JVD, thyromegaly, or bruits.  LUNGS:  Clear to A&P.  CARDIAC:  Showed normal S1 and S2.  There is a faint 1/6 systolic murmur. Rhythm is slightly irregular.  ABDOMEN:  Soft, nontender.  There is no mass, no organomegaly.  PULSES:  Femoral pulses are 2+.  Posterior tibials are 2+.  Dorsalis pedis is absent on the right and 2+ on the left.  LABORATORY DATA:  A 12-lead electrocardiogram shows nonspecific ST and T wave abnormality.  Chest x-ray earlier this year showed cardiomegaly.  IMPRESSION: 1. Chest discomfort with atypical features.  Normal Cardiolite scan this    summer.  Pain is ongoing.  Rule out coronary disease. 2. History of premature ventricular contractions and premature atrial    contractions. 3. History of diverticulitis. 4. Anxiety.  RECOMMENDATIONS:  Brought in at this time for same-day cardiac catheterization, possible angioplasty and stent.  The procedure discussed with the patient and her husband including risks of MI, death, or CVA and she is agreeable and willing to proceed. Dictated by:   Darden Palmer., M.D. Attending:  Darden Palmer., M.D. DD:  02/15/01 TD:  02/15/01 Job: [id number]

## 2010-08-07 NOTE — Cardiovascular Report (Signed)
[place]  Patient:    Glenda [name], DITTMAN Visit Number: [id number] MRN: [id number]          Service Type: Attending:  Darden Palmer., M.D. Dictated by:   Darden Palmer., M.D. Proc. Date: 02/17/01   CC:         Maisie Fus A. Patty Sermons, M.D.   Cardiac Catheterization  HISTORY:  A 77 year old female with a history of chest discomfort and increasing PVCs.  COMMENTS ABOUT PROCEDURE:  The patient tolerated the procedure well without complications.  Following the procedure she had good hemostasis and pedal pulses were present.  Please see the attached catheterization log for the remainder of the details.  PROCEDURE:  Left Heart Catheterization/Coronary Arteriography.             Left Ventriculogram.             Distal Aortogram.             Right Heart Catheterization.  HEMODYNAMIC REPORT:             AO:  Post-contrast 169/60.             LV:  Post-contrast 169/0-15.  ANGIOGRAPHIC RESULTS:  Coronary arteries arise and distribute normally.  Calcification is seen at the ostium of the right coronary artery, as well as the proximal left anterior descending.  The left main coronary artery appears normal.  The left anterior descending coronary artery has significant calcification present proximally, and has luminal irregularity at this site.  Following the diagonal branches and septal perforator is an area where there appears to be some kinking in the vessel.  This produces an area of moderate systolic bridging.  There was no significant atherosclerotic disease at this site.  The left circumflex coronary artery is a codominant vessel, and contains no significant disease.  The right coronary artery is calcified at its ostium and has mild 30% proximal stenosis.  The distal vessel is only codominant.  LEFT VENTRICULOGRAPHY:  Performed in the 30-degree RAO projection.  The aortic valve is normal.  The mitral valve appears normal.   There was mild mitral regurgitation noted.  The left ventricle was normal in size.  The estimated ejection fraction was 60%.  Wall motion was normal.  IMPRESSION: 1. Mild to moderate coronary atherosclerotic heart disease, predominately    involving the LAD with calcification proximally and luminal irregularities,    and mild ostial right coronary artery disease. 2. Significant systolic bridging seen in the mid portion of the left anterior    descending coronary. 3. Normal left ventricular function.  RECOMMENDATIONS:  Intensify beta blocker treatment for PVCs and systolic bridging.  Since the patient did not have ischemia on Cardiolite testing, continued medically therapy is recommended. Dictated by:   Darden Palmer., M.D. Attending:  Darden Palmer., M.D. DD:  02/17/01 TD:  02/17/01 Job: [id number]

## 2010-09-07 ENCOUNTER — Encounter: Payer: Self-pay | Admitting: Cardiology

## 2010-09-14 ENCOUNTER — Other Ambulatory Visit (INDEPENDENT_AMBULATORY_CARE_PROVIDER_SITE_OTHER): Payer: Medicare Other | Admitting: *Deleted

## 2010-09-14 DIAGNOSIS — E785 Hyperlipidemia, unspecified: Secondary | ICD-10-CM

## 2010-09-14 LAB — BASIC METABOLIC PANEL
BUN: 31 mg/dL — ABNORMAL HIGH (ref 6–23)
CO2: 27 mEq/L (ref 19–32)
Calcium: 9.1 mg/dL (ref 8.4–10.5)
Chloride: 100 mEq/L (ref 96–112)
Creatinine, Ser: 1 mg/dL (ref 0.4–1.2)
GFR: 59.6 mL/min — ABNORMAL LOW (ref >60.00)
Glucose, Bld: 83 mg/dL (ref 70–99)
Potassium: 4.2 mEq/L (ref 3.5–5.1)
Sodium: 134 mEq/L — ABNORMAL LOW (ref 135–145)

## 2010-09-14 LAB — HEPATIC FUNCTION PANEL
ALT: 14 U/L (ref 0–35)
AST: 22 U/L (ref 0–37)
Albumin: 4.1 g/dL (ref 3.5–5.2)
Alkaline Phosphatase: 77 U/L (ref 39–117)
Bilirubin, Direct: 0.2 mg/dL (ref 0.0–0.3)
Total Bilirubin: 0.7 mg/dL (ref 0.3–1.2)
Total Protein: 6.8 g/dL (ref 6.0–8.3)

## 2010-09-14 LAB — LIPID PANEL
Cholesterol: 146 mg/dL (ref 0–200)
HDL: 71.8 mg/dL (ref >39.00)
LDL Cholesterol: 65 mg/dL (ref 0–99)
Total CHOL/HDL Ratio: 2
Triglycerides: 46 mg/dL (ref 0.0–149.0)
VLDL: 9.2 mg/dL (ref 0.0–40.0)

## 2010-09-15 ENCOUNTER — Ambulatory Visit (INDEPENDENT_AMBULATORY_CARE_PROVIDER_SITE_OTHER): Payer: Medicare Other | Admitting: Cardiology

## 2010-09-15 ENCOUNTER — Encounter: Payer: Self-pay | Admitting: Cardiology

## 2010-09-15 VITALS — BP 130/60 | HR 64 | Ht 62.0 in | Wt 127.0 lb

## 2010-09-15 DIAGNOSIS — R002 Palpitations: Secondary | ICD-10-CM

## 2010-09-15 DIAGNOSIS — G4700 Insomnia, unspecified: Secondary | ICD-10-CM

## 2010-09-15 DIAGNOSIS — I119 Hypertensive heart disease without heart failure: Secondary | ICD-10-CM

## 2010-09-15 DIAGNOSIS — E7800 Pure hypercholesterolemia, unspecified: Secondary | ICD-10-CM

## 2010-09-15 MED ORDER — TEMAZEPAM 15 MG PO CAPS: 15.0000 mg | ORAL_CAPSULE | Freq: Every evening | ORAL | Status: DC | PRN

## 2010-09-15 NOTE — Assessment & Plan Note (Signed)
The patient has a history of hypercholesterolemia.  She is on Lipitor 20 mg daily with good effect.  She's not having any myalgias from Lipitor.  Her blood work from yesterday was reviewed and is excellent.

## 2010-09-15 NOTE — Assessment & Plan Note (Signed)
The patient is a widow.  She's had a previous problem with insomnia.  She previously had been using Xanax which no longer seems to help keep her sleep at bedtime.  She has also tried Ambien in the past but that seems to have lost its effectiveness and she is somewhat hesitant to continue to use it because of reading about adverse side effects.  She is requesting something else we will try temazepam 15 mg h.s. P.r.n.

## 2010-09-15 NOTE — Progress Notes (Signed)
Glenda [name] Date of Birth:  04-02-1924 [facility] / [facility] [address]   [address] [place], [place]  [address] [phone]           Fax   [phone]  History of Present Illness: This pleasant 77 year old woman is seen for a scheduled followup office visit.  He has a past history of essential hypertension and a history of hypercholesterolemia.  She has a past history of palpitations.  She does not have any significance underlying structural heart disease.  She had a normal stress test using thallium on 11/09/07 which showed no evidence of ischemia and showed good LV systolic function with an ejection fraction of 60% and an echocardiogram on 08/28/08 which demonstrated normal left trigger systolic and diastolic function, I atrial enlargement but normal sinus rhythm, mild to moderate aortic stenosis with mild to moderate aortic insufficiency and moderate mitral regurgitation and mild pulmonary hypertension.  The patient does try to walk for exercise.  Recently she lifted a suitcase wrong and injured her right shoulder.  This occurred in April and she still having pain with it and she may have had a rotator cuff problem.  She has difficulty reaching around to her back with her right arm.  Current Outpatient Prescriptions  Medication Sig Dispense Refill  . ALPRAZolam (XANAX) 0.25 MG tablet Take 0.25 mg by mouth at bedtime as needed.        Marland Kitchen amLODipine (NORVASC) 2.5 MG tablet Take 2.5 mg by mouth every other day.        . Ascorbic Acid (VITAMIN C PO) Take by mouth daily.        Marland Kitchen aspirin 81 MG tablet Take 81 mg by mouth daily.        Marland Kitchen atorvastatin (LIPITOR) 20 MG tablet Take 1 tablet (20 mg total) by mouth daily.  90 tablet  3  . Calcium Carbonate (CALCIUM 500 PO) Take 1,000 mg by mouth daily.        . Cholecalciferol (VITAMIN D) 1000 UNITS capsule Take 1,000 Units by mouth daily.        Marland Kitchen losartan (COZAAR) 100 MG tablet Take 100 mg by mouth daily.        Marland Kitchen MAGNESIUM PO  Take by mouth daily.        . metoprolol succinate (TOPROL-XL) 25 MG 24 hr tablet Take 25 mg by mouth daily.        . polyethylene glycol (MIRALAX / GLYCOLAX) packet Take 17 g by mouth as needed. Every 3 days      . triamterene-hydrochlorothiazide (DYAZIDE) 37.5-25 MG per capsule Take 1 capsule by mouth every morning. 1/2 DAILY       . temazepam (RESTORIL) 15 MG capsule Take 1 capsule (15 mg total) by mouth at bedtime as needed for sleep.  30 capsule  0    Allergies  Allergen Reactions  . Halcion (Triazolam)   . Sulfa Drugs Cross Reactors   . Talwin     Patient Active Problem List  Diagnoses  . Insomnia  . Benign hypertensive heart disease without heart failure  . Hypercholesterolemia  . Palpitations    History  Smoking status  . Never Smoker   Smokeless tobacco  . Not on file    History  Alcohol Use No    Family History  Problem Relation Age of Onset  . Heart disease Mother   . Hypertension Father   . Heart disease Sister     Review of Systems: Constitutional:  no fever chills diaphoresis or fatigue or change in weight.  Head and neck: no hearing loss, no epistaxis, no photophobia or visual disturbance. Respiratory: No cough, shortness of breath or wheezing. Cardiovascular: No chest pain peripheral edema, palpitations. Gastrointestinal: No abdominal distention, no abdominal pain, no change in bowel habits hematochezia or melena. Genitourinary: No dysuria, no frequency, no urgency, no nocturia. Musculoskeletal:No arthralgias, no back pain, no gait disturbance or myalgias.Right shoulder pain for the past 2 months after lifting a heavy Suitcase. Neurological: No dizziness, no headaches, no numbness, no seizures, no syncope, no weakness, no tremors. Hematologic: No lymphadenopathy, no easy bruising. Psychiatric: No confusion, no hallucinations, no sleep disturbance.    Physical Exam: Filed Vitals:   09/15/10 1358  BP: 130/60  Pulse: 64  The general appearance  reveals a well-developed well-nourished elderly woman in no acute distress.The head and neck exam reveals pupils equal and reactive.  Extraocular movements are full.  There is no scleral icterus.  The mouth and pharynx are normal.  The neck is supple.  The carotids reveal no bruits.  The jugular venous pressure is normal.  The  thyroid is not enlarged.  There is no lymphadenopathy.  The chest is clear to percussion and auscultation.  There are no rales or rhonchi.  Expansion of the chest is symmetrical.  The precordium is quiet.  The first heart sound is normal.  The second heart sound is physiologically split.  There is no murmur gallop rub or click.  There is no abnormal lift or heave.  The abdomen is soft and nontender.  The bowel sounds are normal.  The liver and spleen are not enlarged.  There are no abdominal masses.  There are no abdominal bruits.  Extremities reveal good pedal pulses.  There is no phlebitis or edema.  There is no cyanosis or clubbing.  Strength is normal and symmetrical in all extremities.  There is no lateralizing weakness.  There are no sensory deficits.  The skin is warm and dry.  There is no rash.   Assessment/plan The patient is to continue same medication.  4 insomnia we will try temazepam.  If her shoulder fails to improve she will call her orthopedist.  She rechecked here in 4 months for followup office visit and fasting lab work

## 2010-09-15 NOTE — Assessment & Plan Note (Signed)
The patient has a past history of essential hypertension.  She has not been experiencing any dizzy spells or syncope per his not having any chest pain or increased shortness of breath.  She has occasional palpitations.

## 2010-10-06 ENCOUNTER — Other Ambulatory Visit: Payer: Self-pay | Admitting: *Deleted

## 2010-10-06 MED ORDER — LOSARTAN POTASSIUM 100 MG PO TABS: 100.0000 mg | ORAL_TABLET | Freq: Every day | ORAL | Status: DC

## 2010-10-09 ENCOUNTER — Other Ambulatory Visit: Payer: Self-pay | Admitting: Orthopaedic Surgery

## 2010-10-09 DIAGNOSIS — M25511 Pain in right shoulder: Secondary | ICD-10-CM

## 2010-10-13 ENCOUNTER — Other Ambulatory Visit: Payer: Self-pay | Admitting: Cardiology

## 2010-10-13 DIAGNOSIS — G4700 Insomnia, unspecified: Secondary | ICD-10-CM

## 2010-10-14 ENCOUNTER — Other Ambulatory Visit: Payer: Self-pay | Admitting: Cardiology

## 2010-10-14 NOTE — Telephone Encounter (Signed)
Called needing a refill of Temazepam (Restoril), filled at [facility] on [address] [phone]. She would like you to please call back as soon as possible. I could not find her chart.

## 2010-10-14 NOTE — Telephone Encounter (Signed)
Called and left on machine at [facility] again and advised patient

## 2010-10-14 NOTE — Telephone Encounter (Signed)
escribe request  

## 2010-10-17 ENCOUNTER — Other Ambulatory Visit: Payer: Self-pay | Admitting: Cardiology

## 2010-10-19 ENCOUNTER — Ambulatory Visit
Admission: RE | Admit: 2010-10-19 | Discharge: 2010-10-19 | Disposition: A | Discharge status: Home / Self Care | Payer: Medicare Other | Source: Ambulatory Visit | Attending: Orthopaedic Surgery | Admitting: Orthopaedic Surgery

## 2010-10-19 DIAGNOSIS — M25511 Pain in right shoulder: Secondary | ICD-10-CM

## 2010-10-20 ENCOUNTER — Other Ambulatory Visit: Payer: Self-pay | Admitting: *Deleted

## 2010-10-20 MED ORDER — TRIAMTERENE-HCTZ 37.5-25 MG PO CAPS: 1.0000 | ORAL_CAPSULE | ORAL | Status: DC

## 2010-10-20 NOTE — Telephone Encounter (Signed)
Med refill

## 2010-11-02 ENCOUNTER — Ambulatory Visit: Payer: Medicare Other | Attending: Orthopaedic Surgery | Admitting: Physical Therapy

## 2010-11-02 DIAGNOSIS — M25519 Pain in unspecified shoulder: Secondary | ICD-10-CM | POA: Insufficient documentation

## 2010-11-02 DIAGNOSIS — IMO0001 Reserved for inherently not codable concepts without codable children: Secondary | ICD-10-CM | POA: Insufficient documentation

## 2010-11-02 DIAGNOSIS — Z96659 Presence of unspecified artificial knee joint: Secondary | ICD-10-CM | POA: Insufficient documentation

## 2010-11-02 DIAGNOSIS — M25619 Stiffness of unspecified shoulder, not elsewhere classified: Secondary | ICD-10-CM | POA: Insufficient documentation

## 2010-11-04 ENCOUNTER — Ambulatory Visit: Payer: Medicare Other

## 2010-11-09 ENCOUNTER — Ambulatory Visit: Payer: Medicare Other

## 2010-11-11 ENCOUNTER — Ambulatory Visit: Payer: Medicare Other | Admitting: Physical Therapy

## 2010-11-16 ENCOUNTER — Ambulatory Visit: Payer: Medicare Other | Admitting: Physical Therapy

## 2010-11-19 ENCOUNTER — Ambulatory Visit: Payer: Medicare Other | Admitting: Physical Therapy

## 2010-11-24 ENCOUNTER — Telehealth: Payer: Self-pay | Admitting: Cardiology

## 2010-11-24 ENCOUNTER — Other Ambulatory Visit: Payer: Self-pay | Admitting: *Deleted

## 2010-11-24 MED ORDER — AMLODIPINE BESYLATE 2.5 MG PO TABS: ORAL_TABLET | ORAL | Status: DC

## 2010-11-24 NOTE — Telephone Encounter (Signed)
Actually went to [address] urgent care today, started on antibiotic.  Also MD increased her amlodipine to 1/2 tablet daily (from 1/2 tablet every other day).  Will monitor blood pressure daily and call back if up or down.

## 2010-11-24 NOTE — Telephone Encounter (Signed)
Called because she is feeling ill. She has had a constant cough, been feeling nauseated for the past four days and has no appitie. Please call back.

## 2010-11-24 NOTE — Telephone Encounter (Signed)
Patient stated started with sore throat about 1 &1/2 weeks ago.  No appetite for 4 days and now frequency with urinating.  Advised  Dr. Patty Sermons out of the office this week and to go to urgent care.  Verbalized understanding and agreed to go.

## 2010-11-30 ENCOUNTER — Telehealth: Payer: Self-pay | Admitting: Cardiology

## 2010-11-30 NOTE — Telephone Encounter (Signed)
Pt states went to Urgent Care last Tuesday for coughing, nausea, and possible UTI.  Pt states would like to speak to nurse for medical advice and possibly getting in with Dr. Patty Sermons sooner.  Her current appt is for 01/26/11.  Please call pt back.

## 2010-11-30 NOTE — Telephone Encounter (Signed)
Scheduled appointment with Lori NP 

## 2010-12-01 ENCOUNTER — Other Ambulatory Visit: Payer: Self-pay | Admitting: Nurse Practitioner

## 2010-12-01 ENCOUNTER — Encounter: Payer: Self-pay | Admitting: Nurse Practitioner

## 2010-12-01 ENCOUNTER — Other Ambulatory Visit: Payer: Medicare Other

## 2010-12-01 ENCOUNTER — Ambulatory Visit (INDEPENDENT_AMBULATORY_CARE_PROVIDER_SITE_OTHER): Payer: Medicare Other | Admitting: Nurse Practitioner

## 2010-12-01 VITALS — BP 110/60 | HR 56 | Ht 62.0 in | Wt 125.2 lb

## 2010-12-01 DIAGNOSIS — N390 Urinary tract infection, site not specified: Secondary | ICD-10-CM

## 2010-12-01 DIAGNOSIS — I119 Hypertensive heart disease without heart failure: Secondary | ICD-10-CM

## 2010-12-01 DIAGNOSIS — J40 Bronchitis, not specified as acute or chronic: Secondary | ICD-10-CM

## 2010-12-01 DIAGNOSIS — R42 Dizziness and giddiness: Secondary | ICD-10-CM | POA: Insufficient documentation

## 2010-12-01 MED ORDER — MECLIZINE HCL 25 MG PO TABS: 25.0000 mg | ORAL_TABLET | Freq: Three times a day (TID) | ORAL | Status: AC | PRN

## 2010-12-01 MED ORDER — AZITHROMYCIN 250 MG PO TABS: ORAL_TABLET | ORAL | Status: AC

## 2010-12-01 NOTE — Assessment & Plan Note (Signed)
Will let her try some Meclizine prn.

## 2010-12-01 NOTE — Assessment & Plan Note (Signed)
She does not seem to be tolerating the Avelox. I have switched her over to Zpak as directed. She may try the cough med that was already prescribed.

## 2010-12-01 NOTE — Assessment & Plan Note (Signed)
Will repeat the urine. Hopefully the 5 days of Avelox therapy took care of this.

## 2010-12-01 NOTE — Patient Instructions (Addendum)
We need to get you a different antibiotic I am going to put you on a Zpak to take instead of the Avelox You may try the cough syrup for your cough You may try Antivert for your dizziness. Take it up to 3 times as needed. We are going to recheck your urine to see if there is still any infection

## 2010-12-01 NOTE — Progress Notes (Signed)
Glenda [name] Date of Birth: 28-Dec-1924   History of Present Illness: Glenda [name] is seen today for a work in visit. She is seen for Glenda [name]. She has come down with a cold, sore throat, cough and urinary frequency about 2 weeks ago. She has felt nauseous and dizzy. She says everything is spinning when she tries to lay down. She went to the Urgent Care earlier this month. She had a negative CXR and negative CBC. She was treated with Avelox and Zofran. She thought this made her feel worse. She was given some cough med but she was scared to take. She did have a UTI. She has taken 5 days total of the Avelox, none since Saturday.   Current Outpatient Prescriptions on File Prior to Visit  Medication Sig Dispense Refill  . Ascorbic Acid (VITAMIN C PO) Take by mouth daily.        Marland Kitchen aspirin 81 MG tablet Take 81 mg by mouth daily.        Marland Kitchen atorvastatin (LIPITOR) 20 MG tablet Take 1 tablet (20 mg total) by mouth daily.  90 tablet  3  . Calcium Carbonate (CALCIUM 500 PO) Take 1,000 mg by mouth daily.        Marland Kitchen CALCIUM-MAGNESIUM-ZINC PO Take 1,000 mg by mouth daily.        . Cholecalciferol (VITAMIN D) 1000 UNITS capsule Take 1,000 Units by mouth daily.        Marland Kitchen losartan (COZAAR) 100 MG tablet Take 1 tablet (100 mg total) by mouth daily.  90 tablet  3  . metoprolol succinate (TOPROL-XL) 25 MG 24 hr tablet Take 25 mg by mouth daily.        . polyethylene glycol (MIRALAX / GLYCOLAX) packet Take 17 g by mouth as needed. Every 3 days      . temazepam (RESTORIL) 15 MG capsule TAKE ONE CAPSULE BY MOUTH AT BEDTIME AS NEEDED FOR SLEEP  30 capsule  3  . DISCONTD: amLODipine (NORVASC) 2.5 MG tablet 1/2 every day or as directed  30 tablet  11  . DISCONTD: MAXZIDE-25 37.5-25 MG per tablet TAKE 1 TABLET ONCE DAILY.  30 each  11  . DISCONTD: triamterene-hydrochlorothiazide (DYAZIDE) 37.5-25 MG per capsule Take 1 capsule by mouth every morning. 1/2 DAILY  30 capsule  11    Allergies  Allergen Reactions  .  Halcion (Triazolam)   . Sulfa Drugs Cross Reactors   . Talwin     Past Medical History  Diagnosis Date  . Hypertension   . Heart palpitations   . Peripheral edema   . SOB (shortness of breath) on exertion   . PVCs (premature ventricular contractions)   . TIA (transient ischemic attack)   . Hiatal hernia   . Diverticulosis   . OA (osteoarthritis) of knee     Past Surgical History  Procedure Date  . Cardiac catheterization 02/17/2001    MILD REGURGITATION. EF 60%  . Colonoscopy   . Cesarean section   . Hemorroidectomy   . Breast biopsy     LEFT BREAST    History  Smoking status  . Never Smoker   Smokeless tobacco  . Not on file    History  Alcohol Use No    Family History  Problem Relation Age of Onset  . Heart disease Mother   . Hypertension Father   . Heart disease Sister     Review of Systems: The review of systems is positive for coughing. She  still has some urinary frequency. No burning or stinging. No sputum production. No fever.  All other systems were reviewed and are negative.  Physical Exam: BP 110/60  Pulse 56  Ht 5\' 2"  (1.575 m)  Wt 125 lb 3.2 oz (56.79 kg)  BMI 22.90 kg/m2 Patient is very pleasant and in no acute distress. She does have coughing fits.  Skin is warm and dry. Color is normal.  HEENT is unremarkable. Normocephalic/atraumatic. PERRL. Sclera are nonicteric. Neck is supple. No masses. No JVD. Lungs are clear. Cardiac exam shows a regular rate and rhythm. Abdomen is soft. Extremities are without edema. Gait and ROM are intact. No gross neurologic deficits noted.   LABORATORY DATA:   Assessment / Plan:

## 2010-12-01 NOTE — Assessment & Plan Note (Signed)
Blood pressure looks ok.

## 2010-12-02 ENCOUNTER — Ambulatory Visit: Payer: Medicare Other | Attending: Orthopaedic Surgery | Admitting: Physical Therapy

## 2010-12-02 ENCOUNTER — Telehealth: Payer: Self-pay | Admitting: *Deleted

## 2010-12-02 ENCOUNTER — Other Ambulatory Visit (INDEPENDENT_AMBULATORY_CARE_PROVIDER_SITE_OTHER): Payer: Medicare Other

## 2010-12-02 DIAGNOSIS — M25619 Stiffness of unspecified shoulder, not elsewhere classified: Secondary | ICD-10-CM | POA: Insufficient documentation

## 2010-12-02 DIAGNOSIS — Z96659 Presence of unspecified artificial knee joint: Secondary | ICD-10-CM | POA: Insufficient documentation

## 2010-12-02 DIAGNOSIS — IMO0001 Reserved for inherently not codable concepts without codable children: Secondary | ICD-10-CM | POA: Insufficient documentation

## 2010-12-02 DIAGNOSIS — M25519 Pain in unspecified shoulder: Secondary | ICD-10-CM | POA: Insufficient documentation

## 2010-12-02 DIAGNOSIS — N390 Urinary tract infection, site not specified: Secondary | ICD-10-CM

## 2010-12-02 LAB — URINALYSIS
Bilirubin Urine: NEGATIVE
Bilirubin Urine: NEGATIVE
Hgb urine dipstick: NEGATIVE
Hgb urine dipstick: NEGATIVE
Ketones, ur: NEGATIVE
Ketones, ur: NEGATIVE
Leukocytes, UA: NEGATIVE
Leukocytes, UA: NEGATIVE
Nitrite: NEGATIVE
Nitrite: NEGATIVE
Specific Gravity, Urine: 1.015 (ref 1.000–1.030)
Specific Gravity, Urine: 1.015 (ref 1.000–1.030)
Total Protein, Urine: NEGATIVE
Total Protein, Urine: NEGATIVE
Urine Glucose: NEGATIVE
Urine Glucose: NEGATIVE
Urobilinogen, UA: 0.2 (ref 0.0–1.0)
Urobilinogen, UA: 0.2 (ref 0.0–1.0)
pH: 6.5 (ref 5.0–8.0)
pH: 7 (ref 5.0–8.0)

## 2010-12-02 NOTE — Telephone Encounter (Signed)
Advised of labs 

## 2010-12-02 NOTE — Telephone Encounter (Signed)
Message copied by Burnell Blanks on Wed Dec 02, 2010 12:28 PM ------      Message from: Rosalio Macadamia      Created: Wed Dec 02, 2010 10:16 AM       Ok to report. This looks ok. Does not need more antibiotics for her UTI.

## 2010-12-02 NOTE — Progress Notes (Signed)
Advised of u/a

## 2010-12-03 LAB — URINE CULTURE
Colony Count: NO GROWTH
Organism ID, Bacteria: NO GROWTH

## 2010-12-03 NOTE — Progress Notes (Signed)
Already reported

## 2010-12-04 ENCOUNTER — Telehealth: Payer: Self-pay | Admitting: *Deleted

## 2010-12-04 NOTE — Telephone Encounter (Signed)
Message copied by Eugenia Pancoast on Fri Dec 04, 2010  9:49 AM ------      Message from: Rosalio Macadamia      Created: Fri Dec 04, 2010  7:54 AM       Ok to report. Labs are satisfactory.

## 2010-12-04 NOTE — Telephone Encounter (Signed)
Advised urinalysis ok

## 2010-12-14 ENCOUNTER — Ambulatory Visit: Payer: Medicare Other | Admitting: Physical Therapy

## 2010-12-16 ENCOUNTER — Ambulatory Visit: Payer: Medicare Other | Admitting: Physical Therapy

## 2011-01-21 ENCOUNTER — Emergency Department (HOSPITAL_COMMUNITY)
Admission: EM | Admit: 2011-01-21 | Discharge: 2011-01-21 | Disposition: A | Discharge status: Home / Self Care | Payer: Medicare Other | Attending: Emergency Medicine | Admitting: Emergency Medicine

## 2011-01-21 ENCOUNTER — Telehealth: Payer: Self-pay | Admitting: Cardiology

## 2011-01-21 DIAGNOSIS — I10 Essential (primary) hypertension: Secondary | ICD-10-CM | POA: Insufficient documentation

## 2011-01-21 DIAGNOSIS — R072 Precordial pain: Secondary | ICD-10-CM | POA: Insufficient documentation

## 2011-01-21 DIAGNOSIS — I498 Other specified cardiac arrhythmias: Secondary | ICD-10-CM | POA: Insufficient documentation

## 2011-01-21 DIAGNOSIS — I4949 Other premature depolarization: Secondary | ICD-10-CM

## 2011-01-21 LAB — POCT I-STAT, CHEM 8
BUN: 30 mg/dL — ABNORMAL HIGH (ref 6–23)
Calcium, Ion: 1.18 mmol/L (ref 1.12–1.32)
Chloride: 102 mEq/L (ref 96–112)
Creatinine, Ser: 1 mg/dL (ref 0.50–1.10)
Glucose, Bld: 87 mg/dL (ref 70–99)
HCT: 39 % (ref 36.0–46.0)
Hemoglobin: 13.3 g/dL (ref 12.0–15.0)
Potassium: 4.2 mEq/L (ref 3.5–5.1)
Sodium: 135 mEq/L (ref 135–145)
TCO2: 26 mmol/L (ref 0–100)

## 2011-01-21 LAB — POCT I-STAT TROPONIN I: Troponin i, poc: 0.01 ng/mL (ref 0.00–0.08)

## 2011-01-21 LAB — HM DIABETES EYE EXAM

## 2011-01-21 LAB — TSH: TSH: 1.127 u[IU]/mL (ref 0.350–4.500)

## 2011-01-21 LAB — MAGNESIUM: Magnesium: 2 mg/dL (ref 1.5–2.5)

## 2011-01-21 LAB — HM DIABETES FOOT EXAM

## 2011-01-21 NOTE — Telephone Encounter (Signed)
Pt calls having substernal non radiating chest discomfort which started yesterday. She states she talked with Dr. Gala Romney last pm (after hours call).  She was able to sleep 2-3 hours last night and awoke this morning. "it feels like a throbbing deep inside". Her neighbor is aware and bp was 176/69 52.  She does have some lightheadedness, no dizziness or diaphoresis.  Pt states she has had "twinges" in the past but not like this.I spoke with Lawson Fiscal NP about pt.  Have recommended pt to call 911. Pt says her neighbor may drive her instead as she does not feel this chest pressure is intensifying.  I have also notified Trish. Mylo Red RN

## 2011-01-21 NOTE — Telephone Encounter (Signed)
Agree with advice given

## 2011-01-21 NOTE — Telephone Encounter (Signed)
New message:  Chest discomfort stated yesterday.  Called Dr. Gala Romney last night and was told to call back today if no better.  Call sent to triage.

## 2011-01-25 ENCOUNTER — Ambulatory Visit (INDEPENDENT_AMBULATORY_CARE_PROVIDER_SITE_OTHER): Payer: Medicare Other | Admitting: *Deleted

## 2011-01-25 DIAGNOSIS — E7800 Pure hypercholesterolemia, unspecified: Secondary | ICD-10-CM

## 2011-01-25 DIAGNOSIS — I119 Hypertensive heart disease without heart failure: Secondary | ICD-10-CM

## 2011-01-25 LAB — LIPID PANEL
Cholesterol: 162 mg/dL (ref 0–200)
HDL: 81.4 mg/dL (ref >39.00)
LDL Cholesterol: 68 mg/dL (ref 0–99)
Total CHOL/HDL Ratio: 2
Triglycerides: 64 mg/dL (ref 0.0–149.0)
VLDL: 12.8 mg/dL (ref 0.0–40.0)

## 2011-01-25 LAB — BASIC METABOLIC PANEL
BUN: 28 mg/dL — ABNORMAL HIGH (ref 6–23)
CO2: 26 mEq/L (ref 19–32)
Calcium: 9 mg/dL (ref 8.4–10.5)
Chloride: 101 mEq/L (ref 96–112)
Creatinine, Ser: 0.9 mg/dL (ref 0.4–1.2)
GFR: 62.58 mL/min (ref >60.00)
Glucose, Bld: 93 mg/dL (ref 70–99)
Potassium: 3.8 mEq/L (ref 3.5–5.1)
Sodium: 137 mEq/L (ref 135–145)

## 2011-01-25 LAB — HEPATIC FUNCTION PANEL
ALT: 16 U/L (ref 0–35)
AST: 27 U/L (ref 0–37)
Albumin: 4 g/dL (ref 3.5–5.2)
Alkaline Phosphatase: 74 U/L (ref 39–117)
Bilirubin, Direct: 0.1 mg/dL (ref 0.0–0.3)
Total Bilirubin: 0.8 mg/dL (ref 0.3–1.2)
Total Protein: 7.2 g/dL (ref 6.0–8.3)

## 2011-01-26 ENCOUNTER — Ambulatory Visit: Payer: Medicare Other | Admitting: Cardiology

## 2011-01-26 ENCOUNTER — Encounter: Payer: Medicare Other | Admitting: Physician Assistant

## 2011-01-26 ENCOUNTER — Ambulatory Visit (INDEPENDENT_AMBULATORY_CARE_PROVIDER_SITE_OTHER): Payer: Medicare Other | Admitting: Cardiology

## 2011-01-26 ENCOUNTER — Telehealth: Payer: Self-pay | Admitting: Cardiology

## 2011-01-26 DIAGNOSIS — G4700 Insomnia, unspecified: Secondary | ICD-10-CM

## 2011-01-26 DIAGNOSIS — R399 Unspecified symptoms and signs involving the genitourinary system: Secondary | ICD-10-CM

## 2011-01-26 DIAGNOSIS — N390 Urinary tract infection, site not specified: Secondary | ICD-10-CM

## 2011-01-26 DIAGNOSIS — R002 Palpitations: Secondary | ICD-10-CM

## 2011-01-26 MED ORDER — AMOXICILLIN 500 MG PO CAPS: 500.0000 mg | ORAL_CAPSULE | Freq: Three times a day (TID) | ORAL | Status: DC

## 2011-01-26 MED ORDER — TEMAZEPAM 15 MG PO CAPS: 15.0000 mg | ORAL_CAPSULE | Freq: Every evening | ORAL | Status: DC | PRN

## 2011-01-26 NOTE — Progress Notes (Signed)
Exercise Treadmill Test  Pre-Exercise Testing Evaluation Rhythm: Bradycardia Rate:56   PR:  19 QRS:  .08  QT:  .42 QTc: .43     Test  Exercise Tolerance Test Ordering MD: Cassell Clement M.D  Interpreting MD:  Cassell Clement M.D  Unique Test No: 1  Treadmill:  1  Indication for ETT: Palps  Contraindication to ETT: No   Stress Modality: exercise - treadmill  Cardiac Imaging Performed: non   Protocol: Naughton - maximal  Max BP: 147/77  Max MPHR (bpm):  136 85% MPR (bpm):  115  MPHR obtained (bpm):  98 % MPHR obtained:    Reached 85% MPHR (min:sec):   Total Exercise Time (min-sec):  4:12  Workload in METS:  2.5 Borg Scale: 15  Reason ETT Terminated:  fatigue    ST Segment Analysis At Rest: normal ST segments - no evidence of significant ST depression With Exercise: no evidence of significant ST depression  Other Information Arrhythmia:  Yes Angina during ETT:  absent (0) Quality of ETT:  non-diagnostic  ETT Interpretation:  normal - no evidence of ischemia by ST analysis  Comments: Patient did not develop any EKG evidence of ischemia.  Occasional unifocal PVCs during exercise and recovery.  No VT  Recommendations: Continue same meds. Toprol 50 mg  I/2 daily Amlodipine 2.5 1/2 daily Discontinue maxzide  [employer]

## 2011-01-26 NOTE — Telephone Encounter (Signed)
Advised patient could call something in but when culture and sensitivity back may have to change Rx.

## 2011-01-26 NOTE — Telephone Encounter (Signed)
Pt is calling about uti meds to be called to [facility] at [address]

## 2011-01-26 NOTE — Telephone Encounter (Signed)
Agree with plan 

## 2011-01-27 LAB — URINALYSIS W MICROSCOPIC + REFLEX CULTURE
Bilirubin Urine: NEGATIVE
Casts: NONE SEEN
Crystals: NONE SEEN
Glucose, UA: NEGATIVE mg/dL
Hgb urine dipstick: NEGATIVE
Ketones, ur: NEGATIVE mg/dL
Nitrite: NEGATIVE
Protein, ur: NEGATIVE mg/dL
Specific Gravity, Urine: 1.013 (ref 1.005–1.030)
Squamous Epithelial / LPF: NONE SEEN
Urobilinogen, UA: 0.2 mg/dL (ref 0.0–1.0)
pH: 6 (ref 5.0–8.0)

## 2011-01-28 LAB — URINE CULTURE: Colony Count: >=100000

## 2011-02-01 ENCOUNTER — Telehealth: Payer: Self-pay | Admitting: Cardiology

## 2011-02-01 DIAGNOSIS — N390 Urinary tract infection, site not specified: Secondary | ICD-10-CM

## 2011-02-01 MED ORDER — CIPROFLOXACIN HCL 500 MG PO TABS: 500.0000 mg | ORAL_TABLET | Freq: Two times a day (BID) | ORAL | Status: DC

## 2011-02-01 NOTE — Telephone Encounter (Signed)
We need to find out what the sensitivities of the urine culture were.

## 2011-02-01 NOTE — Telephone Encounter (Signed)
Sensitivity resistant to the Amoxil, will Rx Cipro 500 mg twice daily for 5 days per  Dr. Patty Sermons

## 2011-02-01 NOTE — Telephone Encounter (Signed)
Culture not back, please advise

## 2011-02-01 NOTE — Telephone Encounter (Signed)
Pt calling stating that is finished taking amoxicillin and is still c/o urinary tract infection. Please return pt call to discus further.

## 2011-02-02 ENCOUNTER — Telehealth: Payer: Self-pay | Admitting: *Deleted

## 2011-02-02 NOTE — Telephone Encounter (Signed)
Per faxed results of sensitivity, resistant to Amoxil and Rx'd Cipro.  Sent in yesterday and patient advised

## 2011-02-02 NOTE — Telephone Encounter (Signed)
Message copied by Burnell Blanks on Tue Feb 02, 2011 10:11 AM ------      Message from: Cassell Clement      Created: Sat Jan 30, 2011 10:34 AM       Awaiting sensitivities to the E. Coli

## 2011-02-02 NOTE — Telephone Encounter (Signed)
Agree 

## 2011-02-10 ENCOUNTER — Telehealth: Payer: Self-pay | Admitting: *Deleted

## 2011-02-10 DIAGNOSIS — N390 Urinary tract infection, site not specified: Secondary | ICD-10-CM

## 2011-02-10 MED ORDER — CIPROFLOXACIN HCL 500 MG PO TABS: 500.0000 mg | ORAL_TABLET | Freq: Two times a day (BID) | ORAL | Status: DC

## 2011-02-10 NOTE — Telephone Encounter (Signed)
Advised patient

## 2011-02-10 NOTE — Telephone Encounter (Signed)
Call her in another 5 days of Cipro 500 mg twice a day starting now.  Take the last dose of Cipro on Sunday night and then when we see her on Tuesday we will get another urinalysis and culture.

## 2011-02-10 NOTE — Telephone Encounter (Signed)
Message copied by Burnell Blanks on Wed Feb 10, 2011  1:05 PM ------      Message from: Cassell Clement      Created: Mon Feb 08, 2011 10:54 AM       Her organism is sensitive to Cipro.  Hopefully she is feeling better.

## 2011-02-10 NOTE — Telephone Encounter (Signed)
Advised of results.  Still having frequency, check urine at ov next Tuesday or check before then?

## 2011-02-16 ENCOUNTER — Other Ambulatory Visit: Payer: Medicare Other | Admitting: *Deleted

## 2011-02-16 ENCOUNTER — Ambulatory Visit (INDEPENDENT_AMBULATORY_CARE_PROVIDER_SITE_OTHER): Payer: Medicare Other | Admitting: Cardiology

## 2011-02-16 DIAGNOSIS — N390 Urinary tract infection, site not specified: Secondary | ICD-10-CM

## 2011-02-16 DIAGNOSIS — I350 Nonrheumatic aortic (valve) stenosis: Secondary | ICD-10-CM

## 2011-02-16 DIAGNOSIS — I493 Ventricular premature depolarization: Secondary | ICD-10-CM | POA: Insufficient documentation

## 2011-02-16 DIAGNOSIS — E7800 Pure hypercholesterolemia, unspecified: Secondary | ICD-10-CM

## 2011-02-16 DIAGNOSIS — I119 Hypertensive heart disease without heart failure: Secondary | ICD-10-CM

## 2011-02-16 DIAGNOSIS — I359 Nonrheumatic aortic valve disorder, unspecified: Secondary | ICD-10-CM

## 2011-02-16 DIAGNOSIS — I4949 Other premature depolarization: Secondary | ICD-10-CM

## 2011-02-16 NOTE — Patient Instructions (Signed)
Will get urinalysis today and call you with the results Your physician has requested that you have an echocardiogram. Echocardiography is a painless test that uses sound waves to create images of your heart. It provides your doctor with information about the size and shape of your heart and how well your heart's chambers and valves are working. This procedure takes approximately one hour. There are no restrictions for this procedure. Your physician recommends that you continue on your current medications as directed. Please refer to the Current Medication list given to you today. Your physician recommends that you schedule a follow-up appointment in: 4 months with fasting labs (lp/bmet/hfp)

## 2011-02-16 NOTE — Assessment & Plan Note (Signed)
The patient has a history of essential hypertension and is presently on losartan, metoprolol, and amlodipine.  She is not on a diuretic.  She has had problem with electrolyte disturbance in the past

## 2011-02-16 NOTE — Progress Notes (Signed)
Glenda [name] Date of Birth:  Jun 03, 1924 [facility] / [facility] [address]   [address] [place], [place]  [address] [phone]           Fax   [phone]  History of Present Illness: This pleasant 77 year old woman is seen for a scheduled followup office visit.  She has a past history of high blood pressure.  She's also had a history of recent urinary tract infection.  She's had problems in the past with frequent PVCs and palpitations.  She also has a history of aortic valve disease with an echocardiogram several years ago showing normal systolic function with biatrial enlargement and mild to moderate aortic stenosis and mild to moderate aortic insufficiency and moderate mitral regurgitation and the pulmonary artery pressure of 40.  The patient has not been experiencing any chest pain and had a recent treadmill stress test which did not show any ischemia.  Her PVCs at rest did not worsen with exercise and were felt to be benign.  Intertrigo she reports that her PVCs have decreased in frequency.  Current Outpatient Prescriptions  Medication Sig Dispense Refill  . amLODipine (NORVASC) 2.5 MG tablet 1.25 mg every other day.        . Ascorbic Acid (VITAMIN C PO) Take by mouth daily.        Marland Kitchen aspirin 81 MG tablet Take 81 mg by mouth daily.        Marland Kitchen atorvastatin (LIPITOR) 20 MG tablet Take 1 tablet (20 mg total) by mouth daily.  90 tablet  3  . Cholecalciferol (VITAMIN D) 1000 UNITS capsule Take 1,000 Units by mouth daily.        Marland Kitchen losartan (COZAAR) 100 MG tablet Take 1 tablet (100 mg total) by mouth daily.  90 tablet  3  . metoprolol succinate (TOPROL-XL) 25 MG 24 hr tablet Take 25 mg by mouth daily.        . Multiple Vitamins-Minerals (CENTRUM SILVER PO) Take by mouth.        . polyethylene glycol (MIRALAX / GLYCOLAX) packet Take 17 g by mouth as needed. Every 3 days      . temazepam (RESTORIL) 15 MG capsule Take 1 capsule (15 mg total) by mouth at bedtime as needed for  sleep.  30 capsule  5    Allergies  Allergen Reactions  . Avelox (Moxifloxacin Hcl In Nacl) Other (See Comments)    dizziness  . Halcion (Triazolam)   . Sulfa Drugs Cross Reactors   . Talwin     Patient Active Problem List  Diagnoses  . Insomnia  . Benign hypertensive heart disease without heart failure  . Hypercholesterolemia  . Palpitations  . Bronchitis  . UTI (lower urinary tract infection)  . Dizziness  . PVC (premature ventricular contraction)    History  Smoking status  . Never Smoker   Smokeless tobacco  . Not on file    History  Alcohol Use No    Family History  Problem Relation Age of Onset  . Heart disease Mother   . Hypertension Father   . Heart disease Sister     Review of Systems: Constitutional: no fever chills diaphoresis or fatigue or change in weight.  Head and neck: no hearing loss, no epistaxis, no photophobia or visual disturbance. Respiratory: No cough, shortness of breath or wheezing. Cardiovascular: No chest pain peripheral edema, palpitations. Gastrointestinal: No abdominal distention, no abdominal pain, no change in bowel habits hematochezia or melena. Genitourinary: No dysuria,  no frequency, no urgency, no nocturia. Musculoskeletal:No arthralgias, no back pain, no gait disturbance or myalgias. Neurological: No dizziness, no headaches, no numbness, no seizures, no syncope, no weakness, no tremors. Hematologic: No lymphadenopathy, no easy bruising. Psychiatric: No confusion, no hallucinations, no sleep disturbance.    Physical Exam: Filed Vitals:   02/16/11 1051  BP: 144/69  Pulse: 52   general appearance reveals a well-developed well-nourished woman in no distress.Pupils equal and reactive.   Extraocular Movements are full.  There is no scleral icterus.  The mouth and pharynx are normal.  The neck is supple.  The carotids reveal no bruits.  The jugular venous pressure is normal.  The thyroid is not enlarged.  There is no  lymphadenopathy.  The chest is clear to percussion and auscultation. There are no rales or rhonchi. Expansion of the chest is symmetrical.  Heart reveals a soft systolic ejection murmur at the base.  No definite aortic insufficiency murmur heard no gallop or rub.The abdomen is soft and nontender. Bowel sounds are normal. The liver and spleen are not enlarged. There Are no abdominal masses. There are no bruits.  The pedal pulses are good.  There is no phlebitis or edema.  There is no cyanosis or clubbing. Strength is normal and symmetrical in all extremities.  There is no lateralizing weakness.  There are no sensory deficits.  The skin is warm and dry.  There is no rash.    Assessment / Plan: Continue present medication.  Return sooner for a update of her 2-D echo to look at her aortic valve stenosis and insufficiency.  We are also getting a followup urinalysis and culture today recheck in 4 months for a followup office visit and fasting lab work.  We also gave her a list of primary care providers.

## 2011-02-16 NOTE — Assessment & Plan Note (Signed)
The patient's symptoms of dysuria have improved following antibiotic therapy for recent urinary tract infection but she is still having residual symptoms.  We are rechecking a urinalysis with reflex culture and sensitivity today.  She is trying to drink plenty of water and fluids to help her kidneys and bladder.

## 2011-02-16 NOTE — Assessment & Plan Note (Signed)
The patient has a history of hypercholesterolemia and is on low-dose Lipitor 20 mg daily.  She's not having any myalgias from Lipitor

## 2011-02-17 ENCOUNTER — Ambulatory Visit (HOSPITAL_COMMUNITY): Payer: Medicare Other | Attending: Cardiology | Admitting: Radiology

## 2011-02-17 DIAGNOSIS — I10 Essential (primary) hypertension: Secondary | ICD-10-CM | POA: Insufficient documentation

## 2011-02-17 DIAGNOSIS — I350 Nonrheumatic aortic (valve) stenosis: Secondary | ICD-10-CM

## 2011-02-17 DIAGNOSIS — R002 Palpitations: Secondary | ICD-10-CM | POA: Insufficient documentation

## 2011-02-17 DIAGNOSIS — I319 Disease of pericardium, unspecified: Secondary | ICD-10-CM | POA: Insufficient documentation

## 2011-02-17 DIAGNOSIS — I079 Rheumatic tricuspid valve disease, unspecified: Secondary | ICD-10-CM | POA: Insufficient documentation

## 2011-02-17 DIAGNOSIS — E785 Hyperlipidemia, unspecified: Secondary | ICD-10-CM | POA: Insufficient documentation

## 2011-02-17 DIAGNOSIS — I359 Nonrheumatic aortic valve disorder, unspecified: Secondary | ICD-10-CM

## 2011-02-17 DIAGNOSIS — I379 Nonrheumatic pulmonary valve disorder, unspecified: Secondary | ICD-10-CM | POA: Insufficient documentation

## 2011-02-17 DIAGNOSIS — I080 Rheumatic disorders of both mitral and aortic valves: Secondary | ICD-10-CM | POA: Insufficient documentation

## 2011-02-17 LAB — URINALYSIS W MICROSCOPIC + REFLEX CULTURE
Bacteria, UA: NONE SEEN
Bilirubin Urine: NEGATIVE
Casts: NONE SEEN
Crystals: NONE SEEN
Glucose, UA: NEGATIVE mg/dL
Hgb urine dipstick: NEGATIVE
Ketones, ur: NEGATIVE mg/dL
Leukocytes, UA: NEGATIVE
Nitrite: NEGATIVE
Protein, ur: NEGATIVE mg/dL
Specific Gravity, Urine: 1.009 (ref 1.005–1.030)
Squamous Epithelial / LPF: NONE SEEN
Urobilinogen, UA: 0.2 mg/dL (ref 0.0–1.0)
pH: 7 (ref 5.0–8.0)

## 2011-03-25 ENCOUNTER — Ambulatory Visit (INDEPENDENT_AMBULATORY_CARE_PROVIDER_SITE_OTHER): Payer: Medicare Other | Admitting: Internal Medicine

## 2011-03-25 ENCOUNTER — Encounter: Payer: Self-pay | Admitting: Internal Medicine

## 2011-03-25 VITALS — BP 122/72 | HR 49 | Temp 97.9°F | Ht 62.0 in | Wt 127.2 lb

## 2011-03-25 DIAGNOSIS — E7800 Pure hypercholesterolemia, unspecified: Secondary | ICD-10-CM

## 2011-03-25 DIAGNOSIS — I10 Essential (primary) hypertension: Secondary | ICD-10-CM

## 2011-03-25 DIAGNOSIS — Z23 Encounter for immunization: Secondary | ICD-10-CM

## 2011-03-25 DIAGNOSIS — Z0000 Encounter for general adult medical examination without abnormal findings: Secondary | ICD-10-CM

## 2011-03-25 DIAGNOSIS — G4700 Insomnia, unspecified: Secondary | ICD-10-CM

## 2011-03-25 MED ORDER — TRAZODONE HCL 50 MG PO TABS: 25.0000 mg | ORAL_TABLET | Freq: Every evening | ORAL | Status: DC | PRN

## 2011-03-25 NOTE — Assessment & Plan Note (Signed)
On statin - follows labs with cards for same The current medical regimen is effective;  continue present plan and medications.

## 2011-03-25 NOTE — Patient Instructions (Signed)
It was good to see you today. We have reviewed your prior records including labs and tests today We will send for release of records regarding your colonoscopy from Dr. Randa Evens Pneumonia shot done today try trazodone in place of Restoril for sleep as needed - Your prescription(s) have been submitted to your pharmacy. Please take as directed and contact our office if you believe you are having problem(s) with the medication(s). Other medications reviewed, no additional changes today -call when refills are needed Continue to work with Dr. Patty Sermons on your blood pressure, cholesterol and PVC symptoms Please schedule followup every 6-12 months for review, call sooner if problems.

## 2011-03-25 NOTE — Progress Notes (Signed)
Subjective:    Patient ID: Glenda [name], female    DOB: 03-Jun-1924, 78 y.o.   MRN: [id number]  HPI New pt to me and our practice, here to establish care - Previously followed with Dr Jenness Corner (cards) for all medical needs  Here for medicare wellness visit  Diet: heart healthy  Physical activity: active, independent  Depression/mood screen: negative Hearing: intact to whispered voice Visual acuity: grossly normal, performs annual eye exam - yaetts ADLs: capable Fall risk: none Home safety: good Cognitive evaluation: intact to orientation, naming, recall and repetition EOL planning: adv directives, full code/ I agree  I have personally reviewed and have noted 1. The patient's medical and social history 2. Their use of alcohol, tobacco or illicit drugs 3. Their current medications and supplements 4. The patient's functional ability including ADL's, fall risks, home safety risks and hearing or visual impairment. 5. Diet and physical activities 6. Evidence for depression or mood disorders   Also reviewed chronic medical issues today: hypertension - the patient reports compliance with medication(s) as prescribed. Denies adverse side effects.  Dyslipidemia - on statin, follows with cards for same - the patient reports compliance with medication(s) as prescribed. Denies adverse side effects.  PVCs - occassional palpitation symptoms related to same - the patient reports compliance with medication(s) as prescribed. Denies adverse side effects.  Chronic insomnia - prior trials varios BZs ineffective - trouble staying asleep as well as falling asleep - prefers 6h/night - usual between 4.5-5/night - denies depression but notes spouse of 47y passed 2011 and sleeping more challenging since that time - ?other med options  Chronic LBP - related to advanced DDD - hx ESI x 3 in 2009 with [facility] ([place]) - little relief with 2nd or 3rd injection - no radiation of pain - no flares  of pain in last 18 months - uses tylenol prn "rough AMs"  Past Medical History  Diagnosis Date  . Hypertension   . Peripheral edema   . PVCs (premature ventricular contractions)   . TIA (transient ischemic attack)   . Hiatal hernia   . Diverticulosis   . OA (osteoarthritis) of knee   . Hypercholesterolemia   . Insomnia   . DDD (degenerative disc disease), lumbar     severe facet dz and adv DDD MRI L spine 2009   Family History  Problem Relation Age of Onset  . Heart disease Mother   . Hypertension Father   . Heart disease Sister    History  Substance Use Topics  . Smoking status: Never Smoker   . Smokeless tobacco: Not on file  . Alcohol Use: No    Review of Systems Constitutional: Negative for fever or weight change.  Respiratory: Negative for cough and shortness of breath.   Cardiovascular: Negative for chest pain or palpitations.  Gastrointestinal: Negative for abdominal pain, no bowel changes.  Musculoskeletal: Negative for gait problem or joint swelling.  Skin: Negative for rash.  Neurological: Negative for dizziness or headache.  No other specific complaints in a complete review of systems (except as listed in HPI above).     Objective:   Physical Exam BP 122/72  Pulse 49  Temp(Src) 97.9 F (36.6 C) (Oral)  Ht 5\' 2"  (1.575 m)  Wt 127 lb 3.2 oz (57.698 kg)  BMI 23.27 kg/m2  SpO2 98% Wt Readings from Last 3 Encounters:  03/25/11 127 lb 3.2 oz (57.698 kg)  02/16/11 124 lb 6.4 oz (56.427 kg)  12/01/10 125 lb 3.2 oz (  56.79 kg)   Gen: petite older woman - spry and pleasant - NAD Lungs: CTA B - no W/C CV: RRR, no murmur appreciated - no BLE edema Neuro: AAOx4, CN2-12 symmetrically intact - normal cognition, recall, speech, balance and coordination Psyc: normal mood and affect - good insight and normal behavior  Lab Results  Component Value Date   HGB 13.3 01/21/2011   HCT 39.0 01/21/2011   GLUCOSE 93 01/25/2011   CHOL 162 01/25/2011   TRIG 64.0 01/25/2011     HDL 81.40 01/25/2011   LDLCALC 68 01/25/2011   ALT 16 01/25/2011   AST 27 01/25/2011   NA 137 01/25/2011   K 3.8 01/25/2011   CL 101 01/25/2011   CREATININE 0.9 01/25/2011   BUN 28* 01/25/2011   CO2 26 01/25/2011   TSH 1.127 01/21/2011   EKG done 01/21/11 by cards - reviewed in EMR - nsr    Assessment & Plan:   AWV - v70.0 -Today patient counseled on age appropriate routine health concerns for screening and prevention, each reviewed and up to date or declined. Immunizations reviewed and up to date or declined. Labs/ECG reviewed. Risk factors for depression reviewed and negative. Hearing function and visual acuity are intact. ADLs screened and addressed as needed. Functional ability and level of safety reviewed and appropriate. Education, counseling and referrals performed based on assessed risks today. Patient provided with a copy of personalized plan for preventive services.  Also See problem list. Medications and labs reviewed today.

## 2011-03-25 NOTE — Assessment & Plan Note (Signed)
BP Readings from Last 3 Encounters:  03/25/11 122/72  02/16/11 144/69  12/01/10 110/60   The current medical regimen is effective;  continue present plan and medications.

## 2011-03-25 NOTE — Assessment & Plan Note (Signed)
Chronic issue - previous trials of Xanax, Ambien and now Restoril ineffective Will change to low-dose trazodone - new prescription done

## 2011-03-26 ENCOUNTER — Encounter: Payer: Self-pay | Admitting: Internal Medicine

## 2011-04-13 ENCOUNTER — Other Ambulatory Visit: Payer: Self-pay | Admitting: *Deleted

## 2011-04-13 NOTE — Telephone Encounter (Signed)
Pt call and states she can't take trazodone that md rx. Med give her headache afterwards, and at night she is hyper & not able to fall asleep. Requesting md to change med to something else. Did inform pt md is out of office today will be tomorrow with md response....04/13/11@4 :39pm/LMB

## 2011-04-14 MED ORDER — AMITRIPTYLINE HCL 10 MG PO TABS: 10.0000 mg | ORAL_TABLET | Freq: Every day | ORAL | Status: DC

## 2011-04-14 NOTE — Telephone Encounter (Signed)
Pt advised of MD's recommendation, RX/pharmacy.

## 2011-04-14 NOTE — Telephone Encounter (Signed)
Stop traz - start amitriptyline - erx done

## 2011-05-05 ENCOUNTER — Encounter: Payer: Self-pay | Admitting: Nurse Practitioner

## 2011-05-05 ENCOUNTER — Ambulatory Visit (INDEPENDENT_AMBULATORY_CARE_PROVIDER_SITE_OTHER): Payer: Medicare Other | Admitting: Nurse Practitioner

## 2011-05-05 VITALS — BP 158/78 | HR 50 | Ht 62.0 in | Wt 128.0 lb

## 2011-05-05 DIAGNOSIS — I4949 Other premature depolarization: Secondary | ICD-10-CM

## 2011-05-05 DIAGNOSIS — I493 Ventricular premature depolarization: Secondary | ICD-10-CM

## 2011-05-05 DIAGNOSIS — I10 Essential (primary) hypertension: Secondary | ICD-10-CM

## 2011-05-05 MED ORDER — AMLODIPINE BESYLATE 2.5 MG PO TABS: 2.5000 mg | ORAL_TABLET | Freq: Two times a day (BID) | ORAL | Status: DC

## 2011-05-05 NOTE — Patient Instructions (Signed)
Let's increase the Norvasc to 2.5 mg two times a day  Check your blood pressure at home.  Minimize your salt  I will see you next Tuesday.  Call the [facility] office at [phone] if you have any questions, problems or concerns.

## 2011-05-05 NOTE — Assessment & Plan Note (Signed)
Blood pressure is up some. She is on very low dose Norvasc. I have increased her to 2.5 mg BID for the next week. I will see her back on Tuesday to recheck her. She will continue to minimize her salt. She is also going to bring her cuff in to check. Patient is agreeable to this plan and will call if any problems develop in the interim.

## 2011-05-05 NOTE — Progress Notes (Signed)
[name] Date of Birth: [birth year] Medical Record #[id number]  History of Present Illness: [name] is seen today for a work in visit. She is seen for Dr. Patty Sermons. There are concerns about her blood pressure with plans for upcoming surgery. She has a blocked tear duct. She has known HTN, AS, PVC's and hyperlipidemia. She has had trouble with diuretics in the past. Her last echo in December of 2012 showed normal LV systolic function with mild AS.   She comes in today. She feels ok. Has had some headaches. Not really checking her blood pressure at home. She is planning on having her eye surgery next Wednesday. When she went for her preop, her blood pressure was in the 190's systolic. She was asked to follow up here. She tries to watch her salt. Does have some palpitations. Has chronic issues with sleep. She is now seeing Dr. Toney Sang for her primary care. They have tried her on some other type of sleep med which gave her a headache. She has been given some Elavil which she does not feel is helping. Still using some Restoril that Dr. Patty Sermons gave her. This does cause some nightmares but she is willing to tolerate. She will be calling her PCP to discuss this issue further.   Current Outpatient Prescriptions on File Prior to Visit  Medication Sig Dispense Refill  . Ascorbic Acid (VITAMIN C PO) Take by mouth daily.        Marland Kitchen aspirin 81 MG tablet Take 81 mg by mouth daily.        Marland Kitchen atorvastatin (LIPITOR) 20 MG tablet Take 1 tablet (20 mg total) by mouth daily.  90 tablet  3  . Cholecalciferol (VITAMIN D) 1000 UNITS capsule Take 1,000 Units by mouth daily.        Marland Kitchen losartan (COZAAR) 100 MG tablet Take 1 tablet (100 mg total) by mouth daily.  90 tablet  3  . Multiple Vitamins-Minerals (CENTRUM SILVER PO) Take by mouth.        . DISCONTD: amLODipine (NORVASC) 2.5 MG tablet Take 1.25 mg by mouth daily.         Allergies  Allergen Reactions  . Avelox (Moxifloxacin Hcl In Nacl) Other (See  Comments)    dizziness  . Halcion (Triazolam)   . Sulfa Drugs Cross Reactors   . Talwin     Past Medical History  Diagnosis Date  . Hypertension   . Peripheral edema   . PVCs (premature ventricular contractions)   . TIA (transient ischemic attack)   . Hiatal hernia   . Diverticulosis   . OA (osteoarthritis) of knee   . Hypercholesterolemia   . Insomnia   . DDD (degenerative disc disease), lumbar     severe facet dz and adv DDD MRI L spine 2009  . Aortic stenosis     Echo 02/2011 showing mild AS with normal LV systolic function    Past Surgical History  Procedure Date  . Cardiac catheterization 02/17/2001    MILD REGURGITATION. EF 60%  . Colonoscopy   . Cesarean section   . Hemorroidectomy   . Breast biopsy     LEFT BREAST    History  Smoking status  . Never Smoker   Smokeless tobacco  . Not on file    History  Alcohol Use No    Family History  Problem Relation Age of Onset  . Heart disease Mother   . Hypertension Father   . Heart disease Sister  Review of Systems: The review of systems is per the HPI.  No chest pain. Tries to stay active. Has some arthritis in her back. All other systems were reviewed and are negative.  Physical Exam: BP 158/78  Pulse 50  Ht 5\' 2"  (1.575 m)  Wt 128 lb (58.06 kg)  BMI 23.41 kg/m2 Patient is very pleasant and in no acute distress. Skin is warm and dry. Color is normal.  HEENT is unremarkable. Normocephalic/atraumatic. PERRL. Sclera are nonicteric. Neck is supple. No masses. No JVD. Lungs are clear. Cardiac exam shows a regular rate and rhythm. Abdomen is soft. Extremities are without edema. Gait and ROM are intact. No gross neurologic deficits noted.  LABORATORY DATA:   Assessment / Plan:

## 2011-05-05 NOTE — Assessment & Plan Note (Signed)
This is a chronic issue. She remains on beta blocker. She is bradycardic on EKG today. Rate is 50. May need to readjust her metoprolol after we get her blood pressure controlled. She is not having dizziness or syncope.

## 2011-05-10 ENCOUNTER — Telehealth: Payer: Self-pay | Admitting: Cardiology

## 2011-05-10 NOTE — Telephone Encounter (Signed)
Pt has appt tomorrow and wants to talk to nurse re her BP, PLS CALL

## 2011-05-10 NOTE — Telephone Encounter (Signed)
Surgery has been postponed, advised to go back on ASA until 7 days prior to scheduled date.  Keep appointment with Glenda Fiscal NP tomorrow and bring blood pressure machine with her.  States blood pressures are improving

## 2011-05-11 ENCOUNTER — Ambulatory Visit (INDEPENDENT_AMBULATORY_CARE_PROVIDER_SITE_OTHER): Payer: Medicare Other | Admitting: Nurse Practitioner

## 2011-05-11 ENCOUNTER — Encounter: Payer: Self-pay | Admitting: Nurse Practitioner

## 2011-05-11 VITALS — BP 140/70 | HR 58 | Ht 62.0 in | Wt 128.0 lb

## 2011-05-11 DIAGNOSIS — I10 Essential (primary) hypertension: Secondary | ICD-10-CM

## 2011-05-11 NOTE — Patient Instructions (Addendum)
Stay on your current medicines.  Continue to monitor your blood pressure at home.   We will see you back in March.   Call the [facility] office at [phone] if you have any questions, problems or concerns.

## 2011-05-11 NOTE — Assessment & Plan Note (Signed)
Blood pressure has improved. We will leave her on her current regimen. She will see Dr. Patty Sermons at her regular time in March.

## 2011-05-11 NOTE — Progress Notes (Signed)
Glenda [name] Date of Birth: 1925-02-03 Medical Record #[id number]  History of Present Illness: Glenda [name] is seen back today for a follow up visit. She was here last week with elevated blood pressure. She was to have some eye surgery tomorrow and there was concern that her blood pressure was too high. We increased her Norvasc.  She comes back today. She is doing well. Has no complaint. Her eye surgery was postponed for 2 weeks due to the doctor's emergency in his family. Her blood pressure has trended down nicely at home. She feels ok on her medicines.   Current Outpatient Prescriptions on File Prior to Visit  Medication Sig Dispense Refill  . amLODipine (NORVASC) 2.5 MG tablet Take 1 tablet (2.5 mg total) by mouth 2 (two) times daily.  60 tablet  3  . Ascorbic Acid (VITAMIN C PO) Take by mouth daily.        Marland Kitchen aspirin 81 MG tablet Take 81 mg by mouth daily.        Marland Kitchen atorvastatin (LIPITOR) 20 MG tablet Take 1 tablet (20 mg total) by mouth daily.  90 tablet  3  . Cholecalciferol (VITAMIN D) 1000 UNITS capsule Take 1,000 Units by mouth daily.        Marland Kitchen losartan (COZAAR) 100 MG tablet Take 1 tablet (100 mg total) by mouth daily.  90 tablet  3  . metoprolol succinate (TOPROL-XL) 50 MG 24 hr tablet Take 25 mg by mouth daily. Take with or immediately following a meal.      . Multiple Vitamins-Minerals (CENTRUM SILVER PO) Take by mouth.          Allergies  Allergen Reactions  . Avelox (Moxifloxacin Hcl In Nacl) Other (See Comments)    dizziness  . Halcion (Triazolam)   . Sulfa Drugs Cross Reactors   . Talwin     Past Medical History  Diagnosis Date  . Hypertension   . Peripheral edema   . PVCs (premature ventricular contractions)   . TIA (transient ischemic attack)   . Hiatal hernia   . Diverticulosis   . OA (osteoarthritis) of knee   . Hypercholesterolemia   . Insomnia   . DDD (degenerative disc disease), lumbar     severe facet dz and adv DDD MRI L spine 2009  . Aortic stenosis      Echo 02/2011 showing mild AS with normal LV systolic function    Past Surgical History  Procedure Date  . Cardiac catheterization 02/17/2001    MILD REGURGITATION. EF 60%  . Colonoscopy   . Cesarean section   . Hemorroidectomy   . Breast biopsy     LEFT BREAST    History  Smoking status  . Never Smoker   Smokeless tobacco  . Not on file    History  Alcohol Use No    Family History  Problem Relation Age of Onset  . Heart disease Mother   . Hypertension Father   . Heart disease Sister     Review of Systems: The review of systems is per the HPI. No cardiac complaints.  All other systems were reviewed and are negative.  Physical Exam: BP 140/70  Pulse 58  Ht 5\' 2"  (1.575 m)  Wt 128 lb (58.06 kg)  BMI 23.41 kg/m2 Blood pressure by me in the left arm is 150/70, right arm 140/60 and her machine reads 135/70. Patient is very pleasant and in no acute distress. She looks younger than her stated age. Skin is warm  and dry. Color is normal.  HEENT is unremarkable. Normocephalic/atraumatic. PERRL. Sclera are nonicteric. Neck is supple. No masses. No JVD. Lungs are clear. Cardiac exam shows a regular rate and rhythm. She has an outflow murmur. Abdomen is soft. Extremities are without edema. Gait and ROM are intact. No gross neurologic deficits noted.  LABORATORY DATA:   Assessment / Plan:

## 2011-06-02 ENCOUNTER — Emergency Department (HOSPITAL_COMMUNITY)
Admission: EM | Admit: 2011-06-02 | Discharge: 2011-06-02 | Disposition: A | Discharge status: Home / Self Care | Payer: Medicare Other | Attending: Otolaryngology | Admitting: Otolaryngology

## 2011-06-02 ENCOUNTER — Encounter (HOSPITAL_COMMUNITY): Payer: Self-pay | Admitting: Emergency Medicine

## 2011-06-02 DIAGNOSIS — M1710 Unilateral primary osteoarthritis, unspecified knee: Secondary | ICD-10-CM | POA: Insufficient documentation

## 2011-06-02 DIAGNOSIS — R040 Epistaxis: Secondary | ICD-10-CM | POA: Insufficient documentation

## 2011-06-02 DIAGNOSIS — Z7982 Long term (current) use of aspirin: Secondary | ICD-10-CM | POA: Insufficient documentation

## 2011-06-02 DIAGNOSIS — Z8673 Personal history of transient ischemic attack (TIA), and cerebral infarction without residual deficits: Secondary | ICD-10-CM | POA: Insufficient documentation

## 2011-06-02 DIAGNOSIS — E7800 Pure hypercholesterolemia, unspecified: Secondary | ICD-10-CM | POA: Insufficient documentation

## 2011-06-02 DIAGNOSIS — I10 Essential (primary) hypertension: Secondary | ICD-10-CM | POA: Insufficient documentation

## 2011-06-02 LAB — COMPREHENSIVE METABOLIC PANEL
ALT: 13 U/L (ref 0–35)
AST: 27 U/L (ref 0–37)
Albumin: 3.9 g/dL (ref 3.5–5.2)
Alkaline Phosphatase: 82 U/L (ref 39–117)
BUN: 30 mg/dL — ABNORMAL HIGH (ref 6–23)
CO2: 27 mEq/L (ref 19–32)
Calcium: 9.2 mg/dL (ref 8.4–10.5)
Chloride: 98 mEq/L (ref 96–112)
Creatinine, Ser: 0.75 mg/dL (ref 0.50–1.10)
GFR calc Af Amer: 88 mL/min — ABNORMAL LOW (ref >90)
GFR calc non Af Amer: 76 mL/min — ABNORMAL LOW (ref >90)
Glucose, Bld: 116 mg/dL — ABNORMAL HIGH (ref 70–99)
Potassium: 3.9 mEq/L (ref 3.5–5.1)
Sodium: 134 mEq/L — ABNORMAL LOW (ref 135–145)
Total Bilirubin: 0.4 mg/dL (ref 0.3–1.2)
Total Protein: 7.2 g/dL (ref 6.0–8.3)

## 2011-06-02 LAB — PROTIME-INR
INR: 1.02 (ref 0.00–1.49)
Prothrombin Time: 13.6 seconds (ref 11.6–15.2)

## 2011-06-02 LAB — DIFFERENTIAL
Basophils Absolute: 0 10*3/uL (ref 0.0–0.1)
Basophils Relative: 1 % (ref 0–1)
Eosinophils Absolute: 0.2 10*3/uL (ref 0.0–0.7)
Eosinophils Relative: 4 % (ref 0–5)
Lymphocytes Relative: 23 % (ref 12–46)
Lymphs Abs: 1.2 10*3/uL (ref 0.7–4.0)
Monocytes Absolute: 0.4 10*3/uL (ref 0.1–1.0)
Monocytes Relative: 9 % (ref 3–12)
Neutro Abs: 3.3 10*3/uL (ref 1.7–7.7)
Neutrophils Relative %: 64 % (ref 43–77)

## 2011-06-02 LAB — CBC
HCT: 35.9 % — ABNORMAL LOW (ref 36.0–46.0)
Hemoglobin: 12.2 g/dL (ref 12.0–15.0)
MCH: 29.6 pg (ref 26.0–34.0)
MCHC: 34 g/dL (ref 30.0–36.0)
MCV: 87.1 fL (ref 78.0–100.0)
Platelets: 177 10*3/uL (ref 150–400)
RBC: 4.12 MIL/uL (ref 3.87–5.11)
RDW: 13.4 % (ref 11.5–15.5)
WBC: 5.2 10*3/uL (ref 4.0–10.5)

## 2011-06-02 MED ORDER — ONDANSETRON HCL 4 MG/2ML IJ SOLN
INTRAMUSCULAR | Status: AC
  Administered 2011-06-02: 4 mg via INTRAVENOUS
  Filled 2011-06-02: qty 2

## 2011-06-02 MED ORDER — CEPHALEXIN 500 MG PO CAPS: 500.0000 mg | ORAL_CAPSULE | Freq: Four times a day (QID) | ORAL | Status: DC

## 2011-06-02 MED ORDER — TETANUS-DIPHTH-ACELL PERTUSSIS 5-2.5-18.5 LF-MCG/0.5 IM SUSP
0.5000 mL | Freq: Once | INTRAMUSCULAR | Status: AC
  Administered 2011-06-02: 0.5 mL via INTRAMUSCULAR
  Filled 2011-06-02: qty 0.5

## 2011-06-02 MED ORDER — SILVER NITRATE-POT NITRATE 75-25 % EX MISC
CUTANEOUS | Status: AC
  Administered 2011-06-02: 10:00:00
  Filled 2011-06-02: qty 1

## 2011-06-02 MED ORDER — OXYMETAZOLINE HCL 0.05 % NA SOLN
NASAL | Status: AC
  Administered 2011-06-02: 10:00:00
  Filled 2011-06-02: qty 15

## 2011-06-02 MED ORDER — SODIUM CHLORIDE 0.9 % IV SOLN
3.0000 g | Freq: Once | INTRAVENOUS | Status: AC
  Administered 2011-06-02: 3 g via INTRAVENOUS
  Filled 2011-06-02: qty 3

## 2011-06-02 MED ORDER — ONDANSETRON HCL 4 MG/2ML IJ SOLN
4.0000 mg | Freq: Once | INTRAMUSCULAR | Status: AC
  Administered 2011-06-02: 4 mg via INTRAVENOUS

## 2011-06-02 MED ORDER — CEPHALEXIN 250 MG PO CAPS: 250.0000 mg | ORAL_CAPSULE | Freq: Four times a day (QID) | ORAL | Status: AC

## 2011-06-02 NOTE — Discharge Instructions (Signed)

## 2011-06-02 NOTE — Consult Note (Signed)
Glenda [name], Warf [id number] 04-Mar-1925  Reason for Consult:  Epistaxis Requesting Physician:  [name], MD   HPI:  78 year old white female awakened with profuse left-sided epistaxis earlier this morning. It apparently manifested both anterior and posterior. She takes one baby aspirin daily. No other blood thinners. No history of bleeding disorders. No prior nose bleeds ever. She had some sort of dacryocystorhinostomy exactly one week ago with Dr. Carilyn Goodpasture at [facility]. She has some sort of Silastic catheter through the upper and lower lacrimal puncta. To her knowledge this is down in the nose. She feels like somebody remove something from her nose which might have been part of packing, although she is unclear on whether any packing was actually used. She saw Dr. Carilyn Goodpasture yesterday with a good status check. Emergency room physicians placed a balloon pack with unsuccessful control. ENT was called for assistance.  she has a history of hypertension but is reportedly well-controlled.   ROS:  Negative except as in HPI.  Past Medical History  Diagnosis Date  . Hypertension   . Peripheral edema   . PVCs (premature ventricular contractions)   . TIA (transient ischemic attack)   . Hiatal hernia   . Diverticulosis   . OA (osteoarthritis) of knee   . Hypercholesterolemia   . Insomnia   . DDD (degenerative disc disease), lumbar     severe facet dz and adv DDD MRI L spine 2009  . Aortic stenosis     Echo 02/2011 showing mild AS with normal LV systolic function    Allergies  Allergen Reactions  . Avelox (Moxifloxacin Hcl In Nacl) Other (See Comments)    dizziness  . Halcion (Triazolam)   . Sulfa Drugs Cross Reactors   . Talwin     Scheduled Medications:    . ampicillin-sulbactam (UNASYN) IV  3 g Intravenous Once  . ondansetron  4 mg Intravenous Once  . oxymetazoline      . silver nitrate applicators      . silver nitrate applicators      . TDaP  0.5 mL Intramuscular Once     PRN Meds:   Infusions:     Physical Exam:  Filed Vitals:   06/02/11 0538  BP: 136/70  Pulse: 65  Temp: 97.5 F (36.4 C)  Resp: 20    She is thin and pale. She is anxious. Mental status is appropriate. She has a pack in her left nose and some blood soiling about her midface.  She is spitting out some blood from her pharynx. She is breathing through the mouth.  She has some evolving ecchymosis of the left malar eminence consistent with surgery last week. There is a delicate Silastic catheter between the upper and lower lacrimal puncta on the left. I did not examine her ears. He intranasally, she has a mild-moderate rightward septal deviation with a maxillary crest spur. Oral cavity and pharynx show teeth in fair repair. There is some old blood in the pharynx but no current active bleeding. Neck unremarkable.  With informed consent, using Afrin +1% Xylocaine mixture on cotton pledgets in both sides of the nose for topical anesthesia, the nose was cleared of moderate clots on the left and a few small clots on the right. After clearing the clots, additional numbing was attempted more posteriorly. The Silastic catheter was visible in the left nose. There were some clots up in the anterior left middle meatus, probably at the surgical site. I did use a 0 nasal endoscope to inspect  and further clear this area. There was continued bloody oozing. I was able to pack between the middle turbinate and the septum and up into the middle meatus with fibrillar Surgicel. Hemostasis was achieved. She was observed 30 minutes with no additional bleeding.  IMPRESSION:  Postsurgical left epistaxis, probably from a dacryocystorhinostomy.  Aspirin anticoagulation.  PLAN:   The bleeding was controlled as above. I would have her keep her head elevated for several days to reduce the throbbing. Keflex 250 mg 4 times a day to prevent infection with a foreign body in the nose. I encouraged her to take her Vicodin for  the pain from the packing but also from the postsurgical pain. She may add or substitute ibuprofen as needed. I talked with her about nasal hygiene measures. Recheck my office 2 weeks, sooner as needed. I will contact Dr. Carilyn Goodpasture with a progress report. She and her son understand and agree.    [name] 06/02/2011, 8:55 AM

## 2011-06-02 NOTE — ED Notes (Signed)
Bleeding controlled in the lt nare with a rocket insertion small amt noted in rt nare

## 2011-06-02 NOTE — ED Notes (Signed)
Dr Lazarus Salines  At the bedside for ENT procedure

## 2011-06-02 NOTE — ED Notes (Signed)
Bed:WA07<BR> Expected date:<BR> Expected time:<BR> Means of arrival:<BR> Comments:<BR>

## 2011-06-02 NOTE — ED Provider Notes (Addendum)
History     CSN: [id number]  Arrival date & time 06/02/11  4098   First MD Initiated Contact with Patient 06/02/11 [phone]      Chief Complaint  Patient presents with  . Epistaxis    (Consider location/radiation/quality/duration/timing/severity/associated sxs/prior treatment) Patient is a 78 y.o. female presenting with nosebleeds. The history is provided by the patient. No language interpreter was used.  Epistaxis  This is a new problem. The current episode started 3 to 5 hours ago. The problem occurs constantly. The problem has not changed since onset.The problem is associated with aspirin. The bleeding has been from the left nare. She has tried nothing for the symptoms. The treatment provided no relief. Her past medical history does not include frequent nosebleeds.    Past Medical History  Diagnosis Date  . Hypertension   . Peripheral edema   . PVCs (premature ventricular contractions)   . TIA (transient ischemic attack)   . Hiatal hernia   . Diverticulosis   . OA (osteoarthritis) of knee   . Hypercholesterolemia   . Insomnia   . DDD (degenerative disc disease), lumbar     severe facet dz and adv DDD MRI L spine 2009  . Aortic stenosis     Echo 02/2011 showing mild AS with normal LV systolic function    Past Surgical History  Procedure Date  . Cardiac catheterization 02/17/2001    MILD REGURGITATION. EF 60%  . Colonoscopy   . Cesarean section   . Hemorroidectomy   . Breast biopsy     LEFT BREAST    Family History  Problem Relation Age of Onset  . Heart disease Mother   . Hypertension Father   . Heart disease Sister     History  Substance Use Topics  . Smoking status: Never Smoker   . Smokeless tobacco: Not on file  . Alcohol Use: No    OB History    Grav Para Term Preterm Abortions TAB SAB Ect Mult Living                  Review of Systems  Constitutional: Negative.   HENT: Positive for nosebleeds.   Eyes: Negative.   Respiratory: Negative.     Cardiovascular: Negative.   Gastrointestinal: Negative.   Genitourinary: Negative.   Musculoskeletal: Negative.   Skin: Negative.   Neurological: Negative.   Hematological: Negative.   Psychiatric/Behavioral: Negative.     Allergies  Avelox; Halcion; Sulfa drugs cross reactors; and Talwin  Home Medications   Current Outpatient Rx  Name Route Sig Dispense Refill  . AMLODIPINE BESYLATE 2.5 MG PO TABS Oral Take 1 tablet (2.5 mg total) by mouth 2 (two) times daily. 60 tablet 3  . ASPIRIN 81 MG PO TABS Oral Take 81 mg by mouth daily.      . ATORVASTATIN CALCIUM 20 MG PO TABS Oral Take 1 tablet (20 mg total) by mouth daily. 90 tablet 3  . VITAMIN D 1000 UNITS PO CAPS Oral Take 1,000 Units by mouth daily.      Marland Kitchen LOSARTAN POTASSIUM 100 MG PO TABS Oral Take 1 tablet (100 mg total) by mouth daily. 90 tablet 3  . METOPROLOL SUCCINATE ER 50 MG PO TB24 Oral Take 25 mg by mouth daily. Take with or immediately following a meal.    . ADULT MULTIVITAMIN W/MINERALS CH Oral Take 1 tablet by mouth daily.    Marland Kitchen TEMAZEPAM 15 MG PO CAPS Oral Take 15 mg by mouth at bedtime as needed.  Insomnia    . VITAMIN C 500 MG PO TABS Oral Take 500 mg by mouth daily.      BP 136/70  Pulse 65  Temp(Src) 97.5 F (36.4 C) (Oral)  Resp 20  SpO2 100%  Physical Exam  Constitutional: She is oriented to person, place, and time. She appears well-developed and well-nourished.  HENT:  Head: Normocephalic.  Nose: Epistaxis is observed.  Mouth/Throat: No oropharyngeal exudate.       Bleeding left nare with blood down the back of the throat  Eyes: Conjunctivae are normal. Pupils are equal, round, and reactive to light.  Neck: Normal range of motion. Neck supple.  Cardiovascular: Normal rate and regular rhythm.   Pulmonary/Chest: Effort normal and breath sounds normal.  Abdominal: Soft. Bowel sounds are normal. There is no tenderness. There is no rebound and no guarding.  Musculoskeletal: Normal range of motion.   Neurological: She is alert and oriented to person, place, and time.  Skin: Skin is warm and dry.  Psychiatric: She has a normal mood and affect.    ED Course  Procedures (including critical care time)  Labs Reviewed  CBC - Abnormal; Notable for the following:    HCT 35.9 (*)    All other components within normal limits  COMPREHENSIVE METABOLIC PANEL - Abnormal; Notable for the following:    Sodium 134 (*)    Glucose, Bld 116 (*)    BUN 30 (*)    GFR calc non Af Amer 76 (*)    GFR calc Af Amer 88 (*)    All other components within normal limits  DIFFERENTIAL  PROTIME-INR   No results found.   No diagnosis found.    MDM  705 am case d/w Dr. Lazarus Salines who will see the patient, saw patient and stopped bleeding follow up in 2 weeks.  Keflex, see Dr. Raye Sorrow note        Decklan Mau K Keanthony Poole-Rasch, MD 06/02/11 4098  Jasmine Awe, MD 06/02/11 [phone]

## 2011-06-02 NOTE — ED Notes (Signed)
Per EMS: Pt comes from home with nose bleed. Pt states this is the 3rd episode in past hour. Pt recently had "duct surgery" last Wednesday. Pt states she has had nose bleeds since sx but normally they stop. Pt was given Afrin in rout and ice was applied to forehead. Neither were effective.

## 2011-06-07 ENCOUNTER — Telehealth: Payer: Self-pay | Admitting: Cardiology

## 2011-06-07 ENCOUNTER — Ambulatory Visit (INDEPENDENT_AMBULATORY_CARE_PROVIDER_SITE_OTHER): Payer: Medicare Other | Admitting: Nurse Practitioner

## 2011-06-07 DIAGNOSIS — I4949 Other premature depolarization: Secondary | ICD-10-CM

## 2011-06-07 DIAGNOSIS — I493 Ventricular premature depolarization: Secondary | ICD-10-CM

## 2011-06-07 NOTE — Progress Notes (Signed)
06/07/11--pt here for nurse visit for EKG--recording reviewed with lori gerhardt np--pt advised to increase fluids, stay away from codeine for awhile, and if you have palpitations again, she may take an extra 1/2 metoprolol tab--pt and her daughter in law  Agree--nt

## 2011-06-07 NOTE — Telephone Encounter (Signed)
Patient requesting to be seen today, she can be reached at [phone].  She is experiencing weakness, palpitations, PVC's, Chest heaviness, SOB .  She would like to be seen today

## 2011-06-07 NOTE — Telephone Encounter (Signed)
06/07/11--Pt calling stating having weakness and PVC's since surgery on tear duct 2 adys ago--pt bleed after surgery and went to ENT to have nose packed --PVC's have continued and she states she lost a lot of blood and this is why she is weak--aqdvised pt to come in for EKG, and we'll go from there--pt agrees--nt

## 2011-06-08 ENCOUNTER — Telehealth: Payer: Self-pay | Admitting: *Deleted

## 2011-06-09 ENCOUNTER — Ambulatory Visit (INDEPENDENT_AMBULATORY_CARE_PROVIDER_SITE_OTHER): Payer: Medicare Other | Admitting: Emergency Medicine

## 2011-06-09 VITALS — BP 127/67 | HR 62 | Temp 97.6°F | Resp 16 | Ht 62.0 in | Wt 126.0 lb

## 2011-06-09 DIAGNOSIS — I4949 Other premature depolarization: Secondary | ICD-10-CM

## 2011-06-09 DIAGNOSIS — R5383 Other fatigue: Secondary | ICD-10-CM

## 2011-06-09 DIAGNOSIS — I493 Ventricular premature depolarization: Secondary | ICD-10-CM

## 2011-06-09 DIAGNOSIS — R5381 Other malaise: Secondary | ICD-10-CM

## 2011-06-09 DIAGNOSIS — R55 Syncope and collapse: Secondary | ICD-10-CM

## 2011-06-09 DIAGNOSIS — D649 Anemia, unspecified: Secondary | ICD-10-CM

## 2011-06-09 LAB — POCT CBC
Granulocyte percent: 63.2 %G (ref 37–80)
HCT, POC: 31 % — AB (ref 37.7–47.9)
Hemoglobin: 10.1 g/dL — AB (ref 12.2–16.2)
Lymph, poc: 1.7 (ref 0.6–3.4)
MCH, POC: 28.9 pg (ref 27–31.2)
MCHC: 32.6 g/dL (ref 31.8–35.4)
MCV: 88.7 fL (ref 80–97)
MID (cbc): 0.6 (ref 0–0.9)
MPV: 7.3 fL (ref 0–99.8)
POC Granulocyte: 3.9 (ref 2–6.9)
POC LYMPH PERCENT: 27 %L (ref 10–50)
POC MID %: 9.8 %M (ref 0–12)
Platelet Count, POC: 275 10*3/uL (ref 142–424)
RBC: 3.49 M/uL — AB (ref 4.04–5.48)
RDW, POC: 15.2 %
WBC: 6.2 10*3/uL (ref 4.6–10.2)

## 2011-06-09 LAB — IFOBT (OCCULT BLOOD): IFOBT: POSITIVE

## 2011-06-09 MED ORDER — FERROUS SULFATE 325 (65 FE) MG PO TABS: 325.0000 mg | ORAL_TABLET | Freq: Every day | ORAL | Status: DC

## 2011-06-09 NOTE — Telephone Encounter (Signed)
Spoke with patient and she states her stools are black and she has been feeling weak since last week. States he weakness is just getting worse.  Advised she needed to go to urgent care or emergency room and not wait until office visit tomorrow.  Verbalized understanding

## 2011-06-09 NOTE — Progress Notes (Signed)
  Subjective:    Patient ID: Glenda [name], female    DOB: 30-Sep-1924, 78 y.o.   MRN: [id number]  HPI patient enters with rectal bleeding and fatigue. She had a procedure done about 10 days ago she had insertion of a tube in the nasolacrimal duct. This was done by Dr. Ophelia Charter the physician at [facility]. Following this she had significant problems with nasal hemorrhage. She presented to the emergency room it was so long and required emergency ENT assistance with placement of a posterior pack. This was performed by Dr. Trixie Rude. She had an EKG done 48 hours ago and was told at that time and it was clear with no PVCs. Today because she is concerned about black bowel movements. She is unable to sit up without feeling faint. She is extremely weak and fatigued. She denies any associated chest pain or shortness of breath but is unable to walk without assistance    Review of Systems review of systems is as relates to this illness.     Objective:   Physical Exam  Constitutional:       Physical exam reveals a pale female with a bruise beneath the left eye  Eyes: EOM are normal. Pupils are equal, round, and reactive to light.  Neck: No JVD present. No tracheal deviation present. No thyromegaly present.  Cardiovascular: Normal rate, regular rhythm and normal heart sounds.   Pulmonary/Chest: No respiratory distress. She has no wheezes. She has no rales. She exhibits no tenderness.  Abdominal: She exhibits no distension. There is no tenderness. There is no rebound and no guarding.  Genitourinary:       Rectal exam reveals blackish brown stool     EKG shows T-wave inversion in aVL and V2 but no ST changes. There were no PVCs seen.       Assessment & Plan:  Hemoglobin has fallen from 12.2-10.1 since her visit to the ER a week ago. Her Hemoccult is positive. I suspect this is more likely secondary to her nosebleed then to a GI bleed. Her abdominal exam is completely unremarkable. She is to return to  clinic on Monday for repeat hemoglobin. We'll start iron tablets one a day until she is seen. I did not give a higher dose because she is bothered with chronic constipation.

## 2011-06-09 NOTE — Patient Instructions (Signed)

## 2011-06-09 NOTE — Telephone Encounter (Signed)
New Msg: Pt calling wanting to speak with nurse/MD regarding pt having black stool. Please return pt call to discuss further.

## 2011-06-10 ENCOUNTER — Ambulatory Visit: Payer: Medicare Other | Admitting: Cardiology

## 2011-06-10 ENCOUNTER — Ambulatory Visit (INDEPENDENT_AMBULATORY_CARE_PROVIDER_SITE_OTHER): Payer: Medicare Other | Admitting: Cardiology

## 2011-06-10 ENCOUNTER — Encounter: Payer: Self-pay | Admitting: Cardiology

## 2011-06-10 ENCOUNTER — Other Ambulatory Visit (INDEPENDENT_AMBULATORY_CARE_PROVIDER_SITE_OTHER): Payer: Medicare Other

## 2011-06-10 VITALS — BP 116/68 | HR 66 | Ht 62.0 in | Wt 123.0 lb

## 2011-06-10 DIAGNOSIS — D649 Anemia, unspecified: Secondary | ICD-10-CM

## 2011-06-10 DIAGNOSIS — I119 Hypertensive heart disease without heart failure: Secondary | ICD-10-CM

## 2011-06-10 DIAGNOSIS — G4700 Insomnia, unspecified: Secondary | ICD-10-CM

## 2011-06-10 DIAGNOSIS — R040 Epistaxis: Secondary | ICD-10-CM

## 2011-06-10 LAB — HEPATIC FUNCTION PANEL
ALT: 18 U/L (ref 0–35)
AST: 24 U/L (ref 0–37)
Albumin: 4.1 g/dL (ref 3.5–5.2)
Alkaline Phosphatase: 72 U/L (ref 39–117)
Bilirubin, Direct: 0 mg/dL (ref 0.0–0.3)
Total Bilirubin: 0.5 mg/dL (ref 0.3–1.2)
Total Protein: 7 g/dL (ref 6.0–8.3)

## 2011-06-10 LAB — COMPREHENSIVE METABOLIC PANEL
ALT: 15 U/L (ref 0–35)
AST: 22 U/L (ref 0–37)
Albumin: 4 g/dL (ref 3.5–5.2)
Alkaline Phosphatase: 84 U/L (ref 39–117)
BUN: 32 mg/dL — ABNORMAL HIGH (ref 6–23)
CO2: 26 mEq/L (ref 19–32)
Calcium: 9 mg/dL (ref 8.4–10.5)
Chloride: 102 mEq/L (ref 96–112)
Creat: 0.91 mg/dL (ref 0.50–1.10)
Glucose, Bld: 130 mg/dL — ABNORMAL HIGH (ref 70–99)
Potassium: 4.9 mEq/L (ref 3.5–5.3)
Sodium: 137 mEq/L (ref 135–145)
Total Bilirubin: 0.4 mg/dL (ref 0.3–1.2)
Total Protein: 6.3 g/dL (ref 6.0–8.3)

## 2011-06-10 LAB — BASIC METABOLIC PANEL
BUN: 23 mg/dL (ref 6–23)
CO2: 27 mEq/L (ref 19–32)
Calcium: 9.1 mg/dL (ref 8.4–10.5)
Chloride: 96 mEq/L (ref 96–112)
Creatinine, Ser: 0.9 mg/dL (ref 0.4–1.2)
GFR: 66.73 mL/min (ref >60.00)
Glucose, Bld: 97 mg/dL (ref 70–99)
Potassium: 4.3 mEq/L (ref 3.5–5.1)
Sodium: 133 mEq/L — ABNORMAL LOW (ref 135–145)

## 2011-06-10 LAB — LIPID PANEL
Cholesterol: 144 mg/dL (ref 0–200)
HDL: 79.1 mg/dL (ref >39.00)
LDL Cholesterol: 48 mg/dL (ref 0–99)
Total CHOL/HDL Ratio: 2
Triglycerides: 83 mg/dL (ref 0.0–149.0)
VLDL: 16.6 mg/dL (ref 0.0–40.0)

## 2011-06-10 MED ORDER — METOPROLOL SUCCINATE ER 25 MG PO TB24: 25.0000 mg | ORAL_TABLET | Freq: Every day | ORAL | Status: DC

## 2011-06-10 MED ORDER — TEMAZEPAM 15 MG PO CAPS: 15.0000 mg | ORAL_CAPSULE | Freq: Every evening | ORAL | Status: DC | PRN

## 2011-06-10 MED ORDER — OMEPRAZOLE 40 MG PO CPDR: 40.0000 mg | DELAYED_RELEASE_CAPSULE | Freq: Every day | ORAL | Status: DC

## 2011-06-10 NOTE — Assessment & Plan Note (Signed)
She has had no further epistaxis.  She still has the nasal packing in place.  I recommended that she speak with her physicians at [facility] about the appropriate timing of removing the packing and also removing the thin plastic drain which has been present since surgery.

## 2011-06-10 NOTE — Assessment & Plan Note (Signed)
Her hemoglobin was 12.2 in the emergency room and yesterday at Dr. Ellis Parents office it was 10.1.  Her stools are dark.  She is concerned that she might have an ulcer.  It is also quite likely that she is still seeing dark stools as a result of the large amount of blood that she may have swallowed during her epistaxis episode last week.  We will check another CBC today to assure stability.  She started on ferrous sulfate last evening.

## 2011-06-10 NOTE — Progress Notes (Signed)
Glenda [name] Date of Birth:  02-06-1925 [facility] [address] [place], [place]  [address] [phone]         Fax   [phone]  History of Present Illness: This pleasant 78 year old woman is seen for a scheduled followup office visit.  She has not been doing well recently.  She recently had surgery on her left eye tear duct done at [facility].  A week ago she had sudden severe epistaxis and had to call 911 and be taken to [facility].  She lost a considerable amount of blood.  An ENT physician had to be called into the hospital emergency room to pack her nose.  She still has the packing in place as well as still having a plastic drain tube in her left tear duct draining into her nose.  She has felt weak and tired because of her anemia.  Her stools have been dark.  Current Outpatient Prescriptions  Medication Sig Dispense Refill  . amLODipine (NORVASC) 2.5 MG tablet Take 1 tablet (2.5 mg total) by mouth 2 (two) times daily.  60 tablet  3  . atorvastatin (LIPITOR) 20 MG tablet Take 1 tablet (20 mg total) by mouth daily.  90 tablet  3  . cephALEXin (KEFLEX) 250 MG capsule Take 1 capsule (250 mg total) by mouth 4 (four) times daily.  28 capsule  0  . Cholecalciferol (VITAMIN D) 1000 UNITS capsule Take 1,000 Units by mouth daily.        . ferrous sulfate 325 (65 FE) MG tablet Take 1 tablet (325 mg total) by mouth daily with breakfast.  30 tablet  11  . losartan (COZAAR) 100 MG tablet Take 1 tablet (100 mg total) by mouth daily.  90 tablet  3  . metoprolol succinate (TOPROL-XL) 50 MG 24 hr tablet Take 25 mg by mouth daily. Take with or immediately following a meal.      . Multiple Vitamin (MULITIVITAMIN WITH MINERALS) TABS Take 1 tablet by mouth daily.      . temazepam (RESTORIL) 15 MG capsule Take 15 mg by mouth at bedtime as needed. Insomnia      . vitamin C (ASCORBIC ACID) 500 MG tablet Take 500 mg by mouth daily.      Marland Kitchen omeprazole (PRILOSEC) 40 MG  capsule Take 1 capsule (40 mg total) by mouth daily.  30 capsule  11    Allergies  Allergen Reactions  . Avelox (Moxifloxacin Hcl In Nacl) Other (See Comments)    dizziness  . Halcion (Triazolam)   . Sulfa Drugs Cross Reactors   . Talwin     Patient Active Problem List  Diagnoses  . Insomnia  . Benign hypertensive heart disease without heart failure  . Hypercholesterolemia  . Dizziness  . PVC (premature ventricular contraction)  . Hypertension  . Anemia  . Epistaxis    History  Smoking status  . Never Smoker   Smokeless tobacco  . Not on file    History  Alcohol Use No    Family History  Problem Relation Age of Onset  . Heart disease Mother   . Hypertension Father   . Heart disease Sister     Review of Systems: Constitutional: no fever chills diaphoresis or fatigue or change in weight.  Head and neck: no hearing loss, no epistaxis, no photophobia or visual disturbance. Respiratory: No cough, shortness of breath or wheezing. Cardiovascular: No chest pain peripheral edema, palpitations. Gastrointestinal: No abdominal distention, no abdominal  pain, no change in bowel habits hematochezia or melena. Genitourinary: No dysuria, no frequency, no urgency, no nocturia. Musculoskeletal:No arthralgias, no back pain, no gait disturbance or myalgias. Neurological: No dizziness, no headaches, no numbness, no seizures, no syncope, no weakness, no tremors. Hematologic: No lymphadenopathy, no easy bruising. Psychiatric: No confusion, no hallucinations, no sleep disturbance.    Physical Exam: Filed Vitals:   06/10/11 1021  BP: 116/68  Pulse: 66   the general appearance reveals an elderly frail somewhat pale woman in no acute distress.Pupils equal and reactive.   Extraocular Movements are full.  There is no scleral icterus.  The mouth and pharynx are normal.  The neck is supple.  The carotids reveal no bruits.  The jugular venous pressure is normal.  The thyroid is not  enlarged.  There is no lymphadenopathy.  The chest is clear to percussion and auscultation. There are no rales or rhonchi. Expansion of the chest is symmetrical.  The precordium is quiet.  The first heart sound is normal.  The second heart sound is physiologically split.  There is no murmur gallop rub or click.  There is no abnormal lift or heave.  The abdomen is soft and nontender. Bowel sounds are normal. The liver and spleen are not enlarged. There Are no abdominal masses. There are no bruits.  The pedal pulses are good.  There is no phlebitis or edema.  There is no cyanosis or clubbing. Strength is normal and symmetrical in all extremities.  There is no lateralizing weakness.  There are no sensory deficits.  The skin is warm and dry.  There is no rash.    Assessment / Plan: We will check a CBC today.  We will place her empirically on omeprazole 40 mg daily.  If her hemoglobin continues to drop with no further epistaxis to explain it then she will need referral to GI.  She will have an appointment with Dr. Cleta Alberts next week and get a followup CBC then.  We will plan to see her in 2 months for followup cardiology visit with EKG and CBC

## 2011-06-10 NOTE — Assessment & Plan Note (Signed)
Blood pressure has been remaining stable since her last visit.

## 2011-06-10 NOTE — Patient Instructions (Addendum)
Will obtain labs today (CBC)   Start Prilosec (omeprazole) and restart your iron  Your physician recommends that you schedule a follow-up appointment in: 2 month ov/ekg/cbc

## 2011-06-11 ENCOUNTER — Telehealth: Payer: Self-pay | Admitting: *Deleted

## 2011-06-11 NOTE — Telephone Encounter (Signed)
Advised patient of labs 

## 2011-06-11 NOTE — Progress Notes (Signed)
Quick Note:  Please report to patient. The recent labs are stable. Continue same medication and careful diet. Kidney and liver function okay. Serum sodium low so increase dietary salt slightly. CBC not done so she should have that checked again when she sees Dr. Cleta Alberts Monday. ______

## 2011-06-11 NOTE — Telephone Encounter (Signed)
Message copied by Burnell Blanks on Fri Jun 11, 2011  5:35 PM ------      Message from: Cassell Clement      Created: Fri Jun 11, 2011  5:24 AM       Please report to patient.  The recent labs are stable. Continue same medication and careful diet. Kidney and liver function okay.  Serum sodium low so increase dietary salt slightly. CBC not done so she should have that checked again when she sees Dr. Cleta Alberts Monday.

## 2011-06-11 NOTE — Telephone Encounter (Signed)
Call Monday and check on patient

## 2011-06-11 NOTE — Telephone Encounter (Signed)
Agree 

## 2011-06-14 ENCOUNTER — Ambulatory Visit: Payer: Medicare Other | Admitting: Cardiology

## 2011-06-14 ENCOUNTER — Telehealth: Payer: Self-pay

## 2011-06-14 ENCOUNTER — Ambulatory Visit (INDEPENDENT_AMBULATORY_CARE_PROVIDER_SITE_OTHER): Payer: Medicare Other | Admitting: Emergency Medicine

## 2011-06-14 VITALS — BP 153/67 | HR 52 | Temp 97.8°F | Resp 16 | Ht 62.0 in | Wt 124.0 lb

## 2011-06-14 DIAGNOSIS — R040 Epistaxis: Secondary | ICD-10-CM

## 2011-06-14 DIAGNOSIS — D649 Anemia, unspecified: Secondary | ICD-10-CM

## 2011-06-14 DIAGNOSIS — I493 Ventricular premature depolarization: Secondary | ICD-10-CM

## 2011-06-14 LAB — POCT CBC
Granulocyte percent: 64.8 %G (ref 37–80)
HCT, POC: 28.4 % — AB (ref 37.7–47.9)
Hemoglobin: 9.5 g/dL — AB (ref 12.2–16.2)
Lymph, poc: 1.4 (ref 0.6–3.4)
MCH, POC: 29.5 pg (ref 27–31.2)
MCHC: 33.5 g/dL (ref 31.8–35.4)
MCV: 88.2 fL (ref 80–97)
MID (cbc): 0.6 (ref 0–0.9)
MPV: 6.7 fL (ref 0–99.8)
POC Granulocyte: 3.8 (ref 2–6.9)
POC LYMPH PERCENT: 24.5 %L (ref 10–50)
POC MID %: 10.7 %M (ref 0–12)
Platelet Count, POC: 235 10*3/uL (ref 142–424)
RBC: 3.22 M/uL — AB (ref 4.04–5.48)
RDW, POC: 15 %
WBC: 5.8 10*3/uL (ref 4.6–10.2)

## 2011-06-14 LAB — IFOBT (OCCULT BLOOD): IFOBT: NEGATIVE

## 2011-06-14 NOTE — Progress Notes (Signed)
  Subjective:    Patient ID: Glenda [name], female    DOB: Sep 20, 1924, 78 y.o.   MRN: [id number]  HPI since seen last week after having a nose bleed which resulted in a drop in her hemoglobin to 10. She was seen primarily for weakness fatigue and no energy. She enters today feeling much better.  she was started on iron supplementation. She did have a positive stool when checked in the office last week however was just recovering from recurrent nosebleeds.    Review of Systems no chest pain no belly pain overall is feeling much improved stronger     Objective:   Physical Exam  Constitutional: She appears well-developed and well-nourished.  HENT:  Head: Normocephalic.  Right Ear: External ear normal.  Left Ear: External ear normal.  Neck: No JVD present. No tracheal deviation present. No thyromegaly present.  Cardiovascular: Normal rate.        Does have in his occasional skipped beat on exam.  Pulmonary/Chest: No respiratory distress. She has no wheezes. She has no rales. She exhibits no tenderness.  Abdominal: Soft. There is no tenderness. There is no rebound and no guarding.  Lymphadenopathy:    She has no cervical adenopathy.          Assessment & Plan:    here for recheck anemia. She had a severe nosebleed which required packing in the emergency room. She had a significant blood loss with this bleed. When seen in the emergency room her hemoglobin was 12 when I saw her last week her hemoglobin was down to 10 with positive stool. We'll check a CBC today to be sure she is stabilized.

## 2011-06-17 NOTE — Telephone Encounter (Signed)
Patient seen by Dr Cleta Alberts on Monday

## 2011-06-18 ENCOUNTER — Ambulatory Visit (INDEPENDENT_AMBULATORY_CARE_PROVIDER_SITE_OTHER): Payer: Medicare Other | Admitting: Emergency Medicine

## 2011-06-18 VITALS — BP 113/56 | HR 59 | Temp 98.4°F | Resp 16 | Ht 61.0 in | Wt 124.6 lb

## 2011-06-18 DIAGNOSIS — D649 Anemia, unspecified: Secondary | ICD-10-CM

## 2011-06-18 LAB — POCT CBC
Granulocyte percent: 63.4 %G (ref 37–80)
HCT, POC: 31.2 % — AB (ref 37.7–47.9)
Hemoglobin: 10 g/dL — AB (ref 12.2–16.2)
Lymph, poc: 1.3 (ref 0.6–3.4)
MCH, POC: 28.6 pg (ref 27–31.2)
MCHC: 32.1 g/dL (ref 31.8–35.4)
MCV: 89 fL (ref 80–97)
MID (cbc): 0.5 (ref 0–0.9)
MPV: 7 fL (ref 0–99.8)
POC Granulocyte: 3 (ref 2–6.9)
POC LYMPH PERCENT: 27 %L (ref 10–50)
POC MID %: 9.6 %M (ref 0–12)
Platelet Count, POC: 219 10*3/uL (ref 142–424)
RBC: 3.5 M/uL — AB (ref 4.04–5.48)
RDW, POC: 14.7 %
WBC: 4.7 10*3/uL (ref 4.6–10.2)

## 2011-06-18 NOTE — Patient Instructions (Signed)
Will return to clinic 6 weeks  hemoglobin recheck

## 2011-06-18 NOTE — Progress Notes (Signed)
  Subjective:    Patient ID: Glenda [name], female    DOB: 10/02/1924, 78 y.o.   MRN: [id number]  HPI patient enters for recheck of her hemoglobin. She had a bad posterior nosebleed which required packing in the emergency room. She dropped her hemoglobin from about 12-9.5. She is in today for recheck. Since her last visit here she has been to the    Review of Systems     Objective:   Physical Exam examination the nose reveals no active bleeding. Hemoglobin is up to 9.5        Assessment & Plan:  Acute anemia secondary to nasal bleeding. This has resolved with no recurrence since packing was removed. Her hemoglobin is up to 9.5

## 2011-06-30 ENCOUNTER — Telehealth: Payer: Self-pay | Admitting: Cardiology

## 2011-06-30 NOTE — Telephone Encounter (Signed)
Patient states she has had swelling in her ankles and feet over the last couple of weeks.  Is worse when she is on them all day.  Has been having shortness of breath however her HGB 1 week ago was 10.0.  Is being followed by Dr Cleta Alberts for her anemia which is a recent problem.  Her sodium level at last check on 3/21 was 133.  The chart had her taking Amlodipine 2.5 mg 2 a day, patient has only been taking 1 a day.  Doesn't check her blood pressure very often but check a couple days ago and it was around 136/70-80.  Will forward to [facility] NP for review

## 2011-06-30 NOTE — Telephone Encounter (Signed)
Does she have an anemia evaluation underway? This is probably aggravating the issue.  Would stay on just the 2.5 of Norvasc a day. Support stockings. Salt restriction. ? On any diuretic.

## 2011-06-30 NOTE — Telephone Encounter (Signed)
blood pressure has been lower since talking earlier, 110's/60's.  Not taking any diuretics.  Is to follow up again with Dr Cleta Alberts around May 10 for anemia (he thought secondary to sever nose bleed she had and had to go to ED)

## 2011-06-30 NOTE — Telephone Encounter (Signed)
Any reason she shouldn't on flight to [place] next week?

## 2011-06-30 NOTE — Telephone Encounter (Signed)
Pt having feet swelling and she wants to talk you about it because it may be some of the medication she is on

## 2011-07-01 NOTE — Telephone Encounter (Signed)
Left message

## 2011-07-01 NOTE — Telephone Encounter (Signed)
Would have her use support stockings. Needs to get up and walk around.

## 2011-07-04 NOTE — Telephone Encounter (Signed)
Okay to fly but needs to wear support stockings.

## 2011-07-05 ENCOUNTER — Ambulatory Visit (INDEPENDENT_AMBULATORY_CARE_PROVIDER_SITE_OTHER): Payer: Medicare Other | Admitting: Internal Medicine

## 2011-07-05 ENCOUNTER — Telehealth: Payer: Self-pay | Admitting: *Deleted

## 2011-07-05 ENCOUNTER — Encounter: Payer: Self-pay | Admitting: Internal Medicine

## 2011-07-05 ENCOUNTER — Other Ambulatory Visit (INDEPENDENT_AMBULATORY_CARE_PROVIDER_SITE_OTHER): Payer: Medicare Other

## 2011-07-05 VITALS — BP 162/70 | HR 50 | Temp 97.0°F | Ht 62.0 in | Wt 126.8 lb

## 2011-07-05 DIAGNOSIS — R609 Edema, unspecified: Secondary | ICD-10-CM

## 2011-07-05 DIAGNOSIS — D649 Anemia, unspecified: Secondary | ICD-10-CM

## 2011-07-05 DIAGNOSIS — R300 Dysuria: Secondary | ICD-10-CM

## 2011-07-05 DIAGNOSIS — I10 Essential (primary) hypertension: Secondary | ICD-10-CM

## 2011-07-05 LAB — CBC WITH DIFFERENTIAL/PLATELET
Basophils Absolute: 0 10*3/uL (ref 0.0–0.1)
Basophils Relative: 0.9 % (ref 0.0–3.0)
Eosinophils Absolute: 0.2 10*3/uL (ref 0.0–0.7)
Eosinophils Relative: 4.7 % (ref 0.0–5.0)
HCT: 34.5 % — ABNORMAL LOW (ref 36.0–46.0)
Hemoglobin: 11.6 g/dL — ABNORMAL LOW (ref 12.0–15.0)
Lymphocytes Relative: 19.2 % (ref 12.0–46.0)
Lymphs Abs: 0.9 10*3/uL (ref 0.7–4.0)
MCHC: 33.5 g/dL (ref 30.0–36.0)
MCV: 89.4 fl (ref 78.0–100.0)
Monocytes Absolute: 0.6 10*3/uL (ref 0.1–1.0)
Monocytes Relative: 12.3 % — ABNORMAL HIGH (ref 3.0–12.0)
Neutro Abs: 3.1 10*3/uL (ref 1.4–7.7)
Neutrophils Relative %: 62.9 % (ref 43.0–77.0)
Platelets: 216 10*3/uL (ref 150.0–400.0)
RBC: 3.86 Mil/uL — ABNORMAL LOW (ref 3.87–5.11)
RDW: 14.6 % (ref 11.5–14.6)
WBC: 4.9 10*3/uL (ref 4.5–10.5)

## 2011-07-05 LAB — BASIC METABOLIC PANEL
BUN: 26 mg/dL — ABNORMAL HIGH (ref 6–23)
CO2: 28 mEq/L (ref 19–32)
Calcium: 9.2 mg/dL (ref 8.4–10.5)
Chloride: 102 mEq/L (ref 96–112)
Creatinine, Ser: 0.9 mg/dL (ref 0.4–1.2)
GFR: 64.13 mL/min (ref >60.00)
Glucose, Bld: 84 mg/dL (ref 70–99)
Potassium: 4.4 mEq/L (ref 3.5–5.1)
Sodium: 138 mEq/L (ref 135–145)

## 2011-07-05 LAB — URINALYSIS
Bilirubin Urine: NEGATIVE
Hgb urine dipstick: NEGATIVE
Ketones, ur: NEGATIVE
Leukocytes, UA: NEGATIVE
Nitrite: NEGATIVE
Specific Gravity, Urine: 1.01 (ref 1.000–1.030)
Total Protein, Urine: NEGATIVE
Urine Glucose: NEGATIVE
Urobilinogen, UA: 0.2 (ref 0.0–1.0)
pH: 6 (ref 5.0–8.0)

## 2011-07-05 MED ORDER — AMOXICILLIN 500 MG PO CAPS: 500.0000 mg | ORAL_CAPSULE | Freq: Three times a day (TID) | ORAL | Status: AC

## 2011-07-05 MED ORDER — AMLODIPINE BESYLATE 2.5 MG PO TABS: 2.5000 mg | ORAL_TABLET | Freq: Two times a day (BID) | ORAL | Status: DC

## 2011-07-05 MED ORDER — HYDROCHLOROTHIAZIDE 25 MG PO TABS: 12.5000 mg | ORAL_TABLET | Freq: Every day | ORAL | Status: DC

## 2011-07-05 NOTE — Assessment & Plan Note (Signed)
ABL related to severe epistaxis episode of March 2013 On iron supplements now No recurrence reported Recheck CBC

## 2011-07-05 NOTE — Patient Instructions (Signed)
It was good to see you today. We have reviewed your interval records including labs and tests today Test(s) ordered today. Your results will be called to you after review (48-72hours after test completion). If any changes need to be made, you will be notified at that time. Use hydrochlorothiazide tablet as needed for swelling and continue amlodipine twice daily for blood pressure  Amoxicillin antibiotics for UTI symptoms as discussed  Your prescription(s) have been submitted to your pharmacy. Please take as directed and contact our office if you believe you are having problem(s) with the medication(s). Other medications reviewed, no additional changes today - Continue to work with Dr. Patty Sermons on your blood pressure and PVC symptoms Please keep scheduled followup for review, call sooner if problems.

## 2011-07-05 NOTE — Telephone Encounter (Signed)
Left msg on vm wanting results back from lab test this am. Will be leaving going out of town this week... 07/05/11@3 :52pm/LMB

## 2011-07-05 NOTE — Assessment & Plan Note (Signed)
BP Readings from Last 3 Encounters:  07/05/11 162/70  06/18/11 113/56  06/14/11 153/67   Elevated readings reviewed. In setting of 3/13 epistaxis, amlodipine was increased.  Recommend patient continue same at this time though edema maybe related side effect to dosing

## 2011-07-05 NOTE — Telephone Encounter (Signed)
All labs ok - anemia (related to prior nose bleed) has improved, urine appears clear ok to stop iron and omeprazole at this time - no other changes recommended

## 2011-07-05 NOTE — Progress Notes (Signed)
Subjective:    Patient ID: Glenda [name], female    DOB: [birth year], 78 y.o.   MRN: [id number]  HPI  Complains of edema in feet and ankles Onset 1-2 weeks ago Symptoms gradually progressive Denies association with diet change, shortness of breath or chest pain - no new medications Intermittent history of same in past  Also requests recheck of blood count for anemia Acute blood loss anemia last month March 2013 related to severe epistaxis. Was seen in the emergency room and by ENT specialist as well as urgent care 3 times since the initial event  Also reviewed chronic medical issues today: hypertension - the patient reports compliance with medication(s) as prescribed. Denies adverse side effects.  Dyslipidemia - on statin, follows with cards for same - the patient reports compliance with medication(s) as prescribed. Denies adverse side effects.  PVCs - occassional palpitation symptoms related to same - the patient reports compliance with medication(s) as prescribed. Denies adverse side effects.  Chronic insomnia - prior trials varios BZs ineffective - trouble staying asleep as well as falling asleep - prefers 6h/night - usual between 4.5-5/night - denies depression but notes spouse of 47y passed 2011 and sleeping more challenging since that time - ?other med options  Chronic LBP - related to advanced DDD - hx ESI x 3 in 2009 with [facility] ([place]) - little relief with 2nd or 3rd injection - no radiation of pain - no flares of pain in last 18 months - uses tylenol prn "rough AMs"  Past Medical History  Diagnosis Date  . Hypertension   . Peripheral edema   . PVCs (premature ventricular contractions)   . TIA (transient ischemic attack)   . Hiatal hernia   . Diverticulosis   . OA (osteoarthritis) of knee   . Hypercholesterolemia   . Insomnia   . DDD (degenerative disc disease), lumbar     severe facet dz and adv DDD MRI L spine 2009  . Aortic stenosis     Echo 02/2011  showing mild AS with normal LV systolic function    Review of Systems  Genitourinary: Positive for dysuria.   Constitutional: Negative for fever.  Respiratory: Negative for cough and shortness of breath.   Cardiovascular: Negative for chest pain or palpitations.  Gastrointestinal: Negative for abdominal pain, no bowel changes.      Objective:   Physical Exam  BP 162/70  Pulse 50  Temp(Src) 97 F (36.1 C) (Oral)  Ht 5\' 2"  (1.575 m)  Wt 126 lb 12.8 oz (57.516 kg)  BMI 23.19 kg/m2  SpO2 94% Wt Readings from Last 3 Encounters:  07/05/11 126 lb 12.8 oz (57.516 kg)  06/18/11 124 lb 9.6 oz (56.518 kg)  06/14/11 124 lb (56.246 kg)   Gen: petite older woman - spry and pleasant - NAD Lungs: CTA B - no W/C CV: RRR, no murmur appreciated - trace BLE edema, L>R Neuro: AAOx4, CN2-12 symmetrically intact - normal cognition, recall, speech, balance and coordination Psyc: normal mood and affect - good insight and normal behavior  Lab Results  Component Value Date   WBC 4.7 06/18/2011   HGB 10.0* 06/18/2011   HCT 31.2* 06/18/2011   PLT 177 06/02/2011   GLUCOSE 97 06/10/2011   CHOL 144 06/10/2011   TRIG 83.0 06/10/2011   HDL 79.10 06/10/2011   LDLCALC 48 06/10/2011   ALT 18 06/10/2011   AST 24 06/10/2011   NA 133* 06/10/2011   K 4.3 06/10/2011   CL 96 06/10/2011  CREATININE 0.9 06/10/2011   BUN 23 06/10/2011   CO2 27 06/10/2011   TSH 1.127 01/21/2011   INR 1.02 06/02/2011       Assessment & Plan:   Edema - very mild, may be related to increase amlodipine dose -  Check labs and urine now  Also See problem list. Medications and labs reviewed today.

## 2011-07-06 NOTE — Telephone Encounter (Signed)
Notified pt with md response & recommendations... 07/06/1327:23am/LMB

## 2011-07-06 NOTE — Telephone Encounter (Signed)
Advised patient

## 2011-07-19 ENCOUNTER — Telehealth: Payer: Self-pay | Admitting: *Deleted

## 2011-07-19 NOTE — Telephone Encounter (Signed)
Error--nt 

## 2011-08-19 ENCOUNTER — Encounter: Payer: Self-pay | Admitting: Cardiology

## 2011-08-19 ENCOUNTER — Other Ambulatory Visit: Payer: Medicare Other

## 2011-08-19 ENCOUNTER — Ambulatory Visit (INDEPENDENT_AMBULATORY_CARE_PROVIDER_SITE_OTHER): Payer: Medicare Other | Admitting: Cardiology

## 2011-08-19 VITALS — BP 126/66 | HR 72 | Resp 18 | Ht 62.0 in | Wt 127.0 lb

## 2011-08-19 DIAGNOSIS — E7800 Pure hypercholesterolemia, unspecified: Secondary | ICD-10-CM

## 2011-08-19 DIAGNOSIS — I4949 Other premature depolarization: Secondary | ICD-10-CM

## 2011-08-19 DIAGNOSIS — I119 Hypertensive heart disease without heart failure: Secondary | ICD-10-CM

## 2011-08-19 DIAGNOSIS — I493 Ventricular premature depolarization: Secondary | ICD-10-CM

## 2011-08-19 NOTE — Assessment & Plan Note (Signed)
We did not notice any PVCs on her EKG today.  She states that for the last 3 days she has had no awareness of her PVCs although rare to that they were severe and bothersome.

## 2011-08-19 NOTE — Assessment & Plan Note (Signed)
Blood pressure was remaining stable on current therapy.  She is not having any symptoms of CHF

## 2011-08-19 NOTE — Patient Instructions (Signed)
Your physician recommends that you continue on your current medications as directed. Please refer to the Current Medication list given to you today.  Your physician recommends that you schedule a follow-up appointment in: 4 months with fasting labs (lp/bmet/hfp/cbc)  

## 2011-08-19 NOTE — Progress Notes (Signed)
Glenda [name] Date of Birth:  01-13-1925 [facility] [address] [place], [place]  [address] [phone]         Fax   [phone]  History of Present Illness: This pleasant 78 year old woman is seen for a scheduled followup office visit.  She has a history of atypical chest pain and a history of palpitations.  She had a exercise treadmill test on 01/26/11 which showed no evidence of ischemia.  She did have occasional unifocal PVCs during exercise and recovery but no ventricular tachycardia.  He exercised for 4 minutes the test was stopped because of fatigue.  Recently she has been feeling better.  Her palpitations have improved.  He has a history of valvular heart disease with an echocardiogram on 02/19/11 any evidence of mild aortic insufficiency and mild aortic stenosis and normal LV systolic function.  She is not having any symptoms of CHF.  Current Outpatient Prescriptions  Medication Sig Dispense Refill  . alum & mag hydroxide-simeth (MAALOX/MYLANTA) 200-200-20 MG/5ML suspension Take 5 mLs by mouth as needed.      Marland Kitchen amLODipine (NORVASC) 2.5 MG tablet Take 1 tablet (2.5 mg total) by mouth 2 (two) times daily.  60 tablet  3  . Cholecalciferol (VITAMIN D) 1000 UNITS capsule Take 1,000 Units by mouth daily.        . hydrochlorothiazide (HYDRODIURIL) 25 MG tablet Take 0.5 tablets (12.5 mg total) by mouth daily. As needed for swelling  30 tablet  0  . losartan (COZAAR) 100 MG tablet Take 1 tablet (100 mg total) by mouth daily.  90 tablet  3  . metoprolol succinate (TOPROL-XL) 25 MG 24 hr tablet Take 1 tablet (25 mg total) by mouth daily. Take with or immediately following a meal.  30 tablet  11  . Multiple Vitamin (MULITIVITAMIN WITH MINERALS) TABS Take 1 tablet by mouth daily.      . temazepam (RESTORIL) 15 MG capsule Take 1 capsule (15 mg total) by mouth at bedtime as needed. Insomnia  30 capsule  5  . vitamin C (ASCORBIC ACID) 500 MG tablet Take 500 mg by mouth  daily.      Marland Kitchen atorvastatin (LIPITOR) 20 MG tablet Take 1 tablet (20 mg total) by mouth daily.  90 tablet  3    Allergies  Allergen Reactions  . Avelox (Moxifloxacin Hcl In Nacl) Other (See Comments)    dizziness  . Halcion (Triazolam)   . Pentazocine Lactate   . Sulfa Drugs Cross Reactors     Patient Active Problem List  Diagnoses  . Insomnia  . Benign hypertensive heart disease without heart failure  . Hypercholesterolemia  . Dizziness  . PVC (premature ventricular contraction)  . Hypertension  . Anemia  . Epistaxis    History  Smoking status  . Never Smoker   Smokeless tobacco  . Not on file    History  Alcohol Use No    Family History  Problem Relation Age of Onset  . Heart disease Mother   . Hypertension Father   . Heart disease Sister     Review of Systems: Constitutional: no fever chills diaphoresis or fatigue or change in weight.  Head and neck: no hearing loss, no epistaxis, no photophobia or visual disturbance. Respiratory: No cough, shortness of breath or wheezing. Cardiovascular: No chest pain peripheral edema, palpitations. Gastrointestinal: No abdominal distention, no abdominal pain, no change in bowel habits hematochezia or melena. Genitourinary: No dysuria, no frequency, no urgency, no nocturia. Musculoskeletal:No arthralgias,  no back pain, no gait disturbance or myalgias. Neurological: No dizziness, no headaches, no numbness, no seizures, no syncope, no weakness, no tremors. Hematologic: No lymphadenopathy, no easy bruising. Psychiatric: No confusion, no hallucinations, no sleep disturbance.    Physical Exam: Filed Vitals:   08/19/11 1002  BP: 126/66  Pulse: 72  Resp: 18   the general appearance reveals an elderly frail woman in no acute distress.Pupils equal and reactive.   Extraocular Movements are full.  There is no scleral icterus.  The mouth and pharynx are normal.  The neck is supple.  The carotids reveal no bruits.  The jugular  venous pressure is normal.  The thyroid is not enlarged.  There is no lymphadenopathy.  The chest is clear to percussion and auscultation. There are no rales or rhonchi. Expansion of the chest is symmetrical.  The heart reveals a grade 2/6 systolic ejection murmur of aortic stenosis and a grade 2/6 decrescendo diastolic murmur of aortic insufficiency along the left sternal edge. The abdomen is soft and nontender. Bowel sounds are normal. The liver and spleen are not enlarged. There Are no abdominal masses. There are no bruits.  The pedal pulses are good.  There is no phlebitis or edema.  There is no cyanosis or clubbing. Strength is normal and symmetrical in all extremities.  There is no lateralizing weakness.  There are no sensory deficits.  EKG today shows sinus bradycardia and is within normal limits   Assessment / Plan: Continue same medication and be rechecked in 4 months.  Get fasting lipid panel hepatic function panel basal metabolic panel and CBC

## 2011-08-19 NOTE — Assessment & Plan Note (Signed)
The patient has a history of hypercholesterolemia.  She previously had been on atorvastatin at the present time is controlling her nostril with dietary means.

## 2011-08-20 ENCOUNTER — Other Ambulatory Visit: Payer: Self-pay | Admitting: Cardiology

## 2011-08-20 ENCOUNTER — Other Ambulatory Visit: Payer: Self-pay | Admitting: *Deleted

## 2011-08-20 DIAGNOSIS — Z1231 Encounter for screening mammogram for malignant neoplasm of breast: Secondary | ICD-10-CM

## 2011-08-20 MED ORDER — ATORVASTATIN CALCIUM 20 MG PO TABS: 20.0000 mg | ORAL_TABLET | Freq: Every day | ORAL | Status: DC

## 2011-08-20 NOTE — Telephone Encounter (Signed)
Fax Received. Refill Completed. Lajuana Patchell Chowoe (R.M.A)   

## 2011-08-24 NOTE — Progress Notes (Signed)
Addended by: Reine Just on: 08/24/2011 08:05 PM   Modules accepted: Orders

## 2011-09-07 ENCOUNTER — Ambulatory Visit
Admission: RE | Admit: 2011-09-07 | Discharge: 2011-09-07 | Disposition: A | Discharge status: Home / Self Care | Payer: Medicare Other | Source: Ambulatory Visit | Attending: Cardiology | Admitting: Cardiology

## 2011-09-07 DIAGNOSIS — Z1231 Encounter for screening mammogram for malignant neoplasm of breast: Secondary | ICD-10-CM

## 2011-09-09 ENCOUNTER — Other Ambulatory Visit: Payer: Self-pay | Admitting: Cardiology

## 2011-09-09 DIAGNOSIS — R928 Other abnormal and inconclusive findings on diagnostic imaging of breast: Secondary | ICD-10-CM

## 2011-09-15 ENCOUNTER — Encounter: Payer: Self-pay | Admitting: Emergency Medicine

## 2011-09-16 ENCOUNTER — Ambulatory Visit
Admission: RE | Admit: 2011-09-16 | Discharge: 2011-09-16 | Disposition: A | Discharge status: Home / Self Care | Payer: Medicare Other | Source: Ambulatory Visit | Attending: Cardiology | Admitting: Cardiology

## 2011-09-16 DIAGNOSIS — R928 Other abnormal and inconclusive findings on diagnostic imaging of breast: Secondary | ICD-10-CM

## 2011-09-20 ENCOUNTER — Other Ambulatory Visit: Payer: Medicare Other

## 2011-09-27 ENCOUNTER — Other Ambulatory Visit: Payer: Self-pay | Admitting: *Deleted

## 2011-09-27 MED ORDER — LOSARTAN POTASSIUM 100 MG PO TABS: 100.0000 mg | ORAL_TABLET | Freq: Every day | ORAL | Status: DC

## 2011-09-27 NOTE — Telephone Encounter (Signed)
Refilled losartan 

## 2011-10-07 ENCOUNTER — Other Ambulatory Visit: Payer: Self-pay | Admitting: *Deleted

## 2011-10-07 MED ORDER — LOSARTAN POTASSIUM 100 MG PO TABS: 100.0000 mg | ORAL_TABLET | Freq: Every day | ORAL | Status: DC

## 2011-10-07 NOTE — Telephone Encounter (Signed)
Refilled losartan 

## 2011-12-07 ENCOUNTER — Other Ambulatory Visit: Payer: Self-pay | Admitting: Physical Medicine and Rehabilitation

## 2011-12-07 DIAGNOSIS — M48061 Spinal stenosis, lumbar region without neurogenic claudication: Secondary | ICD-10-CM

## 2011-12-07 DIAGNOSIS — M79605 Pain in left leg: Secondary | ICD-10-CM

## 2011-12-10 ENCOUNTER — Other Ambulatory Visit (INDEPENDENT_AMBULATORY_CARE_PROVIDER_SITE_OTHER): Payer: Medicare Other

## 2011-12-10 DIAGNOSIS — I493 Ventricular premature depolarization: Secondary | ICD-10-CM

## 2011-12-10 DIAGNOSIS — I119 Hypertensive heart disease without heart failure: Secondary | ICD-10-CM

## 2011-12-10 DIAGNOSIS — I4949 Other premature depolarization: Secondary | ICD-10-CM

## 2011-12-10 LAB — CBC WITH DIFFERENTIAL/PLATELET
Basophils Absolute: 0 10*3/uL (ref 0.0–0.1)
Basophils Relative: 0.9 % (ref 0.0–3.0)
Eosinophils Absolute: 0.2 10*3/uL (ref 0.0–0.7)
Eosinophils Relative: 6.1 % — ABNORMAL HIGH (ref 0.0–5.0)
HCT: 38.4 % (ref 36.0–46.0)
Hemoglobin: 12.6 g/dL (ref 12.0–15.0)
Lymphocytes Relative: 24.2 % (ref 12.0–46.0)
Lymphs Abs: 0.9 10*3/uL (ref 0.7–4.0)
MCHC: 32.9 g/dL (ref 30.0–36.0)
MCV: 90.2 fl (ref 78.0–100.0)
Monocytes Absolute: 0.5 10*3/uL (ref 0.1–1.0)
Monocytes Relative: 11.9 % (ref 3.0–12.0)
Neutro Abs: 2.2 10*3/uL (ref 1.4–7.7)
Neutrophils Relative %: 56.9 % (ref 43.0–77.0)
Platelets: 166 10*3/uL (ref 150.0–400.0)
RBC: 4.25 Mil/uL (ref 3.87–5.11)
RDW: 16 % — ABNORMAL HIGH (ref 11.5–14.6)
WBC: 3.9 10*3/uL — ABNORMAL LOW (ref 4.5–10.5)

## 2011-12-10 LAB — LIPID PANEL
Cholesterol: 167 mg/dL (ref 0–200)
HDL: 80.5 mg/dL (ref >39.00)
LDL Cholesterol: 77 mg/dL (ref 0–99)
Total CHOL/HDL Ratio: 2
Triglycerides: 50 mg/dL (ref 0.0–149.0)
VLDL: 10 mg/dL (ref 0.0–40.0)

## 2011-12-10 LAB — HEPATIC FUNCTION PANEL
ALT: 13 U/L (ref 0–35)
AST: 22 U/L (ref 0–37)
Albumin: 3.9 g/dL (ref 3.5–5.2)
Alkaline Phosphatase: 77 U/L (ref 39–117)
Bilirubin, Direct: 0.2 mg/dL (ref 0.0–0.3)
Total Bilirubin: 0.7 mg/dL (ref 0.3–1.2)
Total Protein: 6.9 g/dL (ref 6.0–8.3)

## 2011-12-10 LAB — BASIC METABOLIC PANEL
BUN: 28 mg/dL — ABNORMAL HIGH (ref 6–23)
CO2: 27 mEq/L (ref 19–32)
Calcium: 9.4 mg/dL (ref 8.4–10.5)
Chloride: 102 mEq/L (ref 96–112)
Creatinine, Ser: 0.8 mg/dL (ref 0.4–1.2)
GFR: 71.42 mL/min (ref >60.00)
Glucose, Bld: 89 mg/dL (ref 70–99)
Potassium: 3.9 mEq/L (ref 3.5–5.1)
Sodium: 136 mEq/L (ref 135–145)

## 2011-12-12 NOTE — Progress Notes (Signed)
Quick Note:  Please make copy of labs for patient visit. ______ 

## 2011-12-13 ENCOUNTER — Other Ambulatory Visit: Payer: Medicare Other

## 2011-12-15 ENCOUNTER — Ambulatory Visit (INDEPENDENT_AMBULATORY_CARE_PROVIDER_SITE_OTHER): Payer: Medicare Other | Admitting: Cardiology

## 2011-12-15 ENCOUNTER — Encounter: Payer: Self-pay | Admitting: Cardiology

## 2011-12-15 VITALS — BP 163/54 | HR 55 | Ht 62.0 in | Wt 126.4 lb

## 2011-12-15 DIAGNOSIS — M549 Dorsalgia, unspecified: Secondary | ICD-10-CM

## 2011-12-15 DIAGNOSIS — G4700 Insomnia, unspecified: Secondary | ICD-10-CM

## 2011-12-15 DIAGNOSIS — E7800 Pure hypercholesterolemia, unspecified: Secondary | ICD-10-CM

## 2011-12-15 DIAGNOSIS — I119 Hypertensive heart disease without heart failure: Secondary | ICD-10-CM

## 2011-12-15 DIAGNOSIS — M48061 Spinal stenosis, lumbar region without neurogenic claudication: Secondary | ICD-10-CM

## 2011-12-15 MED ORDER — HYDROCODONE-ACETAMINOPHEN 5-500 MG PO TABS: 1.0000 | ORAL_TABLET | Freq: Every evening | ORAL | Status: DC | PRN

## 2011-12-15 MED ORDER — TEMAZEPAM 15 MG PO CAPS: 15.0000 mg | ORAL_CAPSULE | Freq: Every evening | ORAL | Status: DC | PRN

## 2011-12-15 NOTE — Assessment & Plan Note (Signed)
The patient has been having a lot of back discomfort and it bothers her at night and prevents her from getting a good night sleep.  He has been using Tylenol for pain relief.  We will give her a trial of generic Vicodin 5/500 to take one at bedtime.

## 2011-12-15 NOTE — Assessment & Plan Note (Signed)
The patient has a history of hypercholesterolemia and is on Lipitor.  We reviewed her recent labs which are satisfactory

## 2011-12-15 NOTE — Patient Instructions (Addendum)
Rx for hydrocodone at bedtime as needed for back pain.  Do not take the Temazepam when you first start the hydrocodone, it may make you drowsy and you may not need Temazepam  Your physician wants you to follow-up in: 4 months with fasting labs (lp/bmet/hfp)  You will receive a reminder letter in the mail two months in advance. If you don't receive a letter, please call our office to schedule the follow-up appointment.

## 2011-12-15 NOTE — Progress Notes (Signed)
Glenda [name] Date of Birth:  [birth year] [facility] [address] [place], [place]  [address] [phone]         Fax   [phone]  History of Present Illness: This pleasant 78 year old woman is seen for a scheduled followup office visit. She has a history of atypical chest pain and a history of palpitations. She had a exercise treadmill test on 01/26/11 which showed no evidence of ischemia. She did have occasional unifocal PVCs during exercise and recovery but no ventricular tachycardia. He exercised for 4 minutes the test was stopped because of fatigue. Recently she has been feeling better. Her palpitations have improved. He has a history of valvular heart disease with an echocardiogram on 02/19/11 any evidence of mild aortic insufficiency and mild aortic stenosis and normal LV systolic function. She is not having any symptoms of CHF.  Recently she has been having more problems from an orthopedic standpoint.  She has had chronic low back pain.  Dr. Norlene Campbell is her orthopedist.  She has also been seeing a physiatrist Dr. Alvester Morin.  She has had several injections for spinal stenosis without much improvement.  Recently she has also developed a painful right heel spur which has interfered with her walking.   Current Outpatient Prescriptions  Medication Sig Dispense Refill  . alum & mag hydroxide-simeth (MAALOX/MYLANTA) 200-200-20 MG/5ML suspension Take 5 mLs by mouth as needed.      Marland Kitchen amLODipine (NORVASC) 2.5 MG tablet Take 1 tablet (2.5 mg total) by mouth 2 (two) times daily.  60 tablet  3  . atorvastatin (LIPITOR) 20 MG tablet Take 1 tablet (20 mg total) by mouth daily.  90 tablet  3  . Cholecalciferol (VITAMIN D) 1000 UNITS capsule Take 1,000 Units by mouth daily.        . hydrochlorothiazide (HYDRODIURIL) 25 MG tablet Take 0.5 tablets (12.5 mg total) by mouth daily. As needed for swelling  30 tablet  0  . losartan (COZAAR) 100 MG tablet Take 1 tablet (100 mg  total) by mouth daily.  90 tablet  3  . metoprolol succinate (TOPROL-XL) 25 MG 24 hr tablet Take 1 tablet (25 mg total) by mouth daily. Take with or immediately following a meal.  30 tablet  11  . Multiple Vitamin (MULITIVITAMIN WITH MINERALS) TABS Take 1 tablet by mouth daily.      . temazepam (RESTORIL) 15 MG capsule Take 1 capsule (15 mg total) by mouth at bedtime as needed. Insomnia  30 capsule  5  . vitamin C (ASCORBIC ACID) 500 MG tablet Take 500 mg by mouth daily.      Marland Kitchen DISCONTD: atorvastatin (LIPITOR) 20 MG tablet Take 1 tablet (20 mg total) by mouth daily.  90 tablet  3    Allergies  Allergen Reactions  . Amitriptyline     hyper  . Avelox (Moxifloxacin Hcl In Nacl) Other (See Comments)    dizziness  . Halcion (Triazolam)   . Pentazocine Lactate   . Sulfa Drugs Cross Reactors   . Trazodone And Nefazodone     Not effective    Patient Active Problem List  Diagnosis  . Insomnia  . Benign hypertensive heart disease without heart failure  . Hypercholesterolemia  . Dizziness  . PVC (premature ventricular contraction)  . Hypertension  . Anemia  . Epistaxis    History  Smoking status  . Never Smoker   Smokeless tobacco  . Not on file    History  Alcohol Use No  Family History  Problem Relation Age of Onset  . Heart disease Mother   . Hypertension Father   . Heart disease Sister     Review of Systems: Constitutional: no fever chills diaphoresis or fatigue or change in weight.  Head and neck: no hearing loss, no epistaxis, no photophobia or visual disturbance. Respiratory: No cough, shortness of breath or wheezing. Cardiovascular: No chest pain peripheral edema, palpitations. Gastrointestinal: No abdominal distention, no abdominal pain, no change in bowel habits hematochezia or melena. Genitourinary: No dysuria, no frequency, no urgency, no nocturia. Musculoskeletal:No arthralgias, no back pain, no gait disturbance or myalgias. Neurological: No dizziness,  no headaches, no numbness, no seizures, no syncope, no weakness, no tremors. Hematologic: No lymphadenopathy, no easy bruising. Psychiatric: No confusion, no hallucinations, no sleep disturbance.    Physical Exam: Filed Vitals:   12/15/11 1606  BP: 163/54  Pulse: 55   the general appearance reveals a well-groomed elderly woman in no distress.The head and neck exam reveals pupils equal and reactive.  Extraocular movements are full.  There is no scleral icterus.  The mouth and pharynx are normal.  The neck is supple.  The carotids reveal no bruits.  The jugular venous pressure is normal.  The  thyroid is not enlarged.  There is no lymphadenopathy.  The chest is clear to percussion and auscultation.  There are no rales or rhonchi.  Expansion of the chest is symmetrical.  The precordium is quiet.  The first heart sound is normal.  The second heart sound is physiologically split.  There is no murmur gallop rub or click.  There is no abnormal lift or heave.  The abdomen is soft and nontender.  The bowel sounds are normal.  The liver and spleen are not enlarged.  There are no abdominal masses.  There are no abdominal bruits.  Extremities reveal good pedal pulses.  There is no phlebitis or edema.  She does have a tender bump on the lower area of the right Achilles tendon insertion.  This may be tendinitis. There is no cyanosis or clubbing.  Strength is normal and symmetrical in all extremities.  There is no lateralizing weakness.  There are no sensory deficits.  The skin is warm and dry.  There is no rash.     Assessment / Plan: The patient is to consult with her orthopedist about her right heel pain predicament.  She may need to see a podiatrist or a orthopedic foot specialist.  For her insomnia and low back pain worse at night we will give her a trial of Vicodin 5/500 one at bedtime.  She has MiraLax on hand which she can use if she develops constipation.  She continue other medicines the same and be  rechecked in 4 months for followup office visit and lab work

## 2011-12-15 NOTE — Assessment & Plan Note (Signed)
Her blood pressure initially today on arrival was elevated.  I rechecked it later during the exam it was down to 140/68.  She has not been sleeping well and is in significant pain from her back and this is affecting her blood pressure

## 2011-12-16 ENCOUNTER — Ambulatory Visit
Admission: RE | Admit: 2011-12-16 | Discharge: 2011-12-16 | Disposition: A | Discharge status: Home / Self Care | Payer: Medicare Other | Source: Ambulatory Visit | Attending: Physical Medicine and Rehabilitation | Admitting: Physical Medicine and Rehabilitation

## 2011-12-16 DIAGNOSIS — M48061 Spinal stenosis, lumbar region without neurogenic claudication: Secondary | ICD-10-CM

## 2011-12-16 DIAGNOSIS — M79605 Pain in left leg: Secondary | ICD-10-CM

## 2011-12-27 ENCOUNTER — Other Ambulatory Visit: Payer: Self-pay | Admitting: Cardiology

## 2011-12-27 MED ORDER — AMLODIPINE BESYLATE 2.5 MG PO TABS: 2.5000 mg | ORAL_TABLET | Freq: Two times a day (BID) | ORAL | Status: DC

## 2011-12-27 NOTE — Telephone Encounter (Signed)
New problem:  Need clarify  of dosage / direction of medication

## 2011-12-28 ENCOUNTER — Telehealth: Payer: Self-pay | Admitting: Cardiology

## 2011-12-28 NOTE — Telephone Encounter (Signed)
Patient stated she has only been taking Amlodipine 2.5 mg daily and was at last ov, will continue current regimen. Changed on med list and called pharmacy with change ([facility])

## 2011-12-28 NOTE — Telephone Encounter (Signed)
Pt said she called yesterday re question in dosage, was told she would get a call back and hasn't heard anything, I done see message at all, we called in and med list shows amlodipine is to be taken twice a day and she was only talking one a day, pls call back

## 2011-12-28 NOTE — Telephone Encounter (Signed)
Left message to call back  

## 2012-01-04 ENCOUNTER — Ambulatory Visit (INDEPENDENT_AMBULATORY_CARE_PROVIDER_SITE_OTHER): Payer: Medicare Other | Admitting: Family Medicine

## 2012-01-04 VITALS — BP 138/64 | HR 63 | Temp 97.5°F | Resp 18 | Ht 61.0 in | Wt 123.4 lb

## 2012-01-04 DIAGNOSIS — L0100 Impetigo, unspecified: Secondary | ICD-10-CM

## 2012-01-04 MED ORDER — CEPHALEXIN 500 MG PO CAPS: 500.0000 mg | ORAL_CAPSULE | Freq: Three times a day (TID) | ORAL | Status: DC

## 2012-01-04 NOTE — Progress Notes (Signed)
78 yo woman with 4 days of left cheek vesicles just lateral to mouth.  The area is irritated and she has been using camphophenique.  O:  Cluster of 4 vesicles overlying erythema  A:  Impetigo  P: 1. Impetigo    Keflex 500 tid

## 2012-01-04 NOTE — Patient Instructions (Addendum)
Impetigo  Impetigo is an infection of the skin, most common in babies and children.   CAUSES   It is caused by staphylococcal or streptococcal germs (bacteria). Impetigo can start after any damage to the skin. The damage to the skin may be from things like:    Chickenpox.   Scrapes.   Scratches.   Insect bites (common when children scratch the bite).   Cuts.   Nail biting or chewing.  Impetigo is contagious. It can be spread from one person to another. Avoid close skin contact, or sharing towels or clothing.  SYMPTOMS   Impetigo usually starts out as small blisters or pustules. Then they turn into tiny yellow-crusted sores (lesions).   There may also be:   Large blisters.   Itching or pain.   Pus.   Swollen lymph glands.  With scratching, irritation, or non-treatment, these small areas may get larger. Scratching can cause the germs to get under the fingernails; then scratching another part of the skin can cause the infection to be spread there.  DIAGNOSIS   Diagnosis of impetigo is usually made by a physical exam. A skin culture (test to grow bacteria) may be done to prove the diagnosis or to help decide the best treatment.   TREATMENT   Mild impetigo can be treated with prescription antibiotic cream. Oral antibiotic medicine may be used in more severe cases. Medicines for itching may be used.  HOME CARE INSTRUCTIONS    To avoid spreading impetigo to other body areas:   Keep fingernails short and clean.   Avoid scratching.   Cover infected areas if necessary to keep from scratching.   Gently wash the infected areas with antibiotic soap and water.   Soak crusted areas in warm soapy water using antibiotic soap.   Gently rub the areas to remove crusts. Do not scrub.   Wash hands often to avoid spread this infection.   Keep children with impetigo home from school or daycare until they have used an antibiotic cream for 48 hours (2 days) or oral antibiotic medicine for 24 hours (1 day), and their skin  shows significant improvement.   Children may attend school or daycare if they only have a few sores and if the sores can be covered by a bandage or clothing.  SEEK MEDICAL CARE IF:    More blisters or sores show up despite treatment.   Other family members get sores.   Rash is not improving after 48 hours (2 days) of treatment.  SEEK IMMEDIATE MEDICAL CARE IF:    You see spreading redness or swelling of the skin around the sores.   You see red streaks coming from the sores.   Your child develops a fever of 100.4 F (37.2 C) or higher.   Your child develops a sore throat.   Your child is acting ill (lethargic, sick to their stomach).  Document Released: 03/05/2000 Document Revised: 05/31/2011 Document Reviewed: 01/03/2008  [employer] Patient Information 2013 [employer].

## 2012-01-04 NOTE — Progress Notes (Signed)
   Signed, Elvina Sidle, MD 01/04/2012 12:04 PM

## 2012-02-29 ENCOUNTER — Telehealth: Payer: Self-pay | Admitting: Cardiology

## 2012-02-29 ENCOUNTER — Ambulatory Visit (INDEPENDENT_AMBULATORY_CARE_PROVIDER_SITE_OTHER): Payer: Medicare Other | Admitting: Cardiology

## 2012-02-29 VITALS — BP 120/64 | HR 60 | Ht 62.0 in | Wt 126.1 lb

## 2012-02-29 DIAGNOSIS — I358 Other nonrheumatic aortic valve disorders: Secondary | ICD-10-CM

## 2012-02-29 DIAGNOSIS — R42 Dizziness and giddiness: Secondary | ICD-10-CM

## 2012-02-29 DIAGNOSIS — G4700 Insomnia, unspecified: Secondary | ICD-10-CM

## 2012-02-29 DIAGNOSIS — R0602 Shortness of breath: Secondary | ICD-10-CM

## 2012-02-29 DIAGNOSIS — I4949 Other premature depolarization: Secondary | ICD-10-CM

## 2012-02-29 DIAGNOSIS — I493 Ventricular premature depolarization: Secondary | ICD-10-CM

## 2012-02-29 DIAGNOSIS — I119 Hypertensive heart disease without heart failure: Secondary | ICD-10-CM

## 2012-02-29 DIAGNOSIS — I359 Nonrheumatic aortic valve disorder, unspecified: Secondary | ICD-10-CM

## 2012-02-29 MED ORDER — HYDROCHLOROTHIAZIDE 25 MG PO TABS: 25.0000 mg | ORAL_TABLET | ORAL | Status: DC

## 2012-02-29 NOTE — Assessment & Plan Note (Signed)
Patient reports that she has been sleeping better on her new sleeping pill.

## 2012-02-29 NOTE — Assessment & Plan Note (Signed)
Her blood pressure has been stable on current therapy.  However she has been having increasing pedal edema for the past 3-4 weeks.  She was on amlodipine 2.5 mg twice a day

## 2012-02-29 NOTE — Assessment & Plan Note (Signed)
No recent palpitations or PVCs.

## 2012-02-29 NOTE — Assessment & Plan Note (Signed)
The patient has not been expressing any recent dizziness.

## 2012-02-29 NOTE — Telephone Encounter (Signed)
New Problem:    Patient called in wanting to speak with you because she is having issues with swelling in her feet and ankles for the past three weeks.  Was wondering if she needed to stop by to be seen by Dr. Patty Sermons today.  Please call back.

## 2012-02-29 NOTE — Patient Instructions (Addendum)
04/03/12 at 10:00 appointment with  Dr. Patty Sermons  Start Hydrochlorothiazide 25 mg 1/2 tablet every other day  Will have you go to [facility] from [facility] for chest Xray  Call back if symptoms no better or worse

## 2012-02-29 NOTE — Telephone Encounter (Signed)
Scheduled appointment for today, advised patient

## 2012-02-29 NOTE — Progress Notes (Signed)
Glenda [name] Date of Birth:  December 17, 1924 [facility] [address] [place], [place]  [address] [phone]         Fax   [phone]  History of Present Illness: This pleasant 78 year old woman is seen for a work in office visit.  She has had worsening peripheral edema for the past 3 or 4 weeks. She has a history of atypical chest pain and a history of palpitations. She had a exercise treadmill test on 01/26/11 which showed no evidence of ischemia. She did have occasional unifocal PVCs during exercise and recovery but no ventricular tachycardia. He exercised for 4 minutes the test was stopped because of fatigue. Recently she has been feeling better. Her palpitations have improved. He has a history of valvular heart disease with an echocardiogram on 02/19/11 any evidence of mild aortic insufficiency and mild aortic stenosis and normal LV systolic function. She is not having any symptoms of CHF. Recently she has been having more problems from an orthopedic standpoint. She has had chronic low back pain. Dr. Norlene Campbell is her orthopedist. She has also been seeing a physiatrist Dr. Alvester Morin. She has had several injections for spinal stenosis without much improvement. Recently she has also developed a painful right heel spur which has interfered with her walking.  Her back is still giving her a lot of trouble and she has an appointment to see Dr. Danielle Dess in February.  She states that she hopes that she does not need to have any surgery.   Current Outpatient Prescriptions  Medication Sig Dispense Refill  . amLODipine (NORVASC) 2.5 MG tablet Take 2.5 mg by mouth 2 (two) times daily.       Marland Kitchen aspirin 81 MG tablet Take 81 mg by mouth daily.      Marland Kitchen atorvastatin (LIPITOR) 20 MG tablet Take 1 tablet (20 mg total) by mouth daily.  90 tablet  3  . Cholecalciferol (VITAMIN D) 1000 UNITS capsule Take 1,000 Units by mouth daily.        Marland Kitchen HYDROcodone-acetaminophen (VICODIN) 5-500 MG per  tablet Take 1 tablet by mouth at bedtime as needed for pain.  30 tablet  3  . losartan (COZAAR) 100 MG tablet Take 1 tablet (100 mg total) by mouth daily.  90 tablet  3  . metoprolol succinate (TOPROL-XL) 25 MG 24 hr tablet Take 1 tablet (25 mg total) by mouth daily. Take with or immediately following a meal.  30 tablet  11  . Multiple Vitamin (MULITIVITAMIN WITH MINERALS) TABS Take 1 tablet by mouth daily.      . polyethylene glycol (MIRALAX / GLYCOLAX) packet Take 17 g by mouth as needed.      . temazepam (RESTORIL) 15 MG capsule Take 1 capsule (15 mg total) by mouth at bedtime as needed. Insomnia  30 capsule  5  . vitamin C (ASCORBIC ACID) 500 MG tablet Take 500 mg by mouth daily.      Marland Kitchen alum & mag hydroxide-simeth (MAALOX/MYLANTA) 200-200-20 MG/5ML suspension Take 5 mLs by mouth as needed.      . hydrochlorothiazide (HYDRODIURIL) 25 MG tablet Take 1 tablet (25 mg total) by mouth as directed. 1/2 tablet every other day  15 tablet  3  . [DISCONTINUED] hydrochlorothiazide (HYDRODIURIL) 25 MG tablet Take 0.5 tablets (12.5 mg total) by mouth daily. As needed for swelling  30 tablet  0  . [DISCONTINUED] hydrochlorothiazide (HYDRODIURIL) 25 MG tablet Take 25 mg by mouth as directed. 1/2 tablet every other day  Allergies  Allergen Reactions  . Amitriptyline     hyper  . Avelox (Moxifloxacin Hcl In Nacl) Other (See Comments)    dizziness  . Halcion (Triazolam)   . Pentazocine Lactate   . Sulfa Drugs Cross Reactors   . Trazodone And Nefazodone     Not effective    Patient Active Problem List  Diagnosis  . Insomnia  . Benign hypertensive heart disease without heart failure  . Hypercholesterolemia  . Dizziness  . PVC (premature ventricular contraction)  . Hypertension  . Anemia  . Epistaxis    History  Smoking status  . Never Smoker   Smokeless tobacco  . Not on file    History  Alcohol Use No    Family History  Problem Relation Age of Onset  . Heart disease Mother     . Hypertension Father   . Heart disease Sister     Review of Systems: Constitutional: no fever chills diaphoresis or fatigue or change in weight.  Head and neck: no hearing loss, no epistaxis, no photophobia or visual disturbance. Respiratory: No cough, shortness of breath or wheezing. Cardiovascular: No chest pain peripheral edema, palpitations. Gastrointestinal: No abdominal distention, no abdominal pain, no change in bowel habits hematochezia or melena. Genitourinary: No dysuria, no frequency, no urgency, no nocturia. Musculoskeletal:No arthralgias, no back pain, no gait disturbance or myalgias. Neurological: No dizziness, no headaches, no numbness, no seizures, no syncope, no weakness, no tremors. Hematologic: No lymphadenopathy, no easy bruising. Psychiatric: No confusion, no hallucinations, no sleep disturbance.    Physical Exam: Filed Vitals:   02/29/12 1419  BP: 120/64  Pulse: 60   the general appearance reveals a well-developed well-nourished elderly woman in no distress.Pupils equal and reactive.   Extraocular Movements are full.  There is no scleral icterus.  The mouth and pharynx are normal.  The neck is supple.  The carotids reveal no bruits.  The jugular venous pressure is normal.  The thyroid is not enlarged.  There is no lymphadenopathy.The chest is clear to percussion and auscultation. There are no rales or rhonchi. Expansion of the chest is symmetrical.  The heart reveals a grade 2/6 systolic ejection murmur and a grade 1/6 decrescendo murmur of aortic insufficiency at the left sternal edge.The abdomen is soft and nontender. Bowel sounds are normal. The liver and spleen are not enlarged. There Are no abdominal masses. There are no bruits.  Extremities show 2+ bilateral ankle edema. Strength is normal and symmetrical in all extremities.  There is no lateralizing weakness.  There are no sensory deficits. The skin is warm and dry.  There is no rash.     Assessment /  Plan: Are going to get a chest x-ray to look for any increase in heart size.  If there is increased heart size compared to 2011 we will consider updating her echocardiogram.  She will continue amlodipine 2.5 mg twice a day and start hydrochlorothiazide 25 mg one half tablet every other day.  Recheck in January at her regular visit at which time we will get lab work

## 2012-03-01 ENCOUNTER — Ambulatory Visit (INDEPENDENT_AMBULATORY_CARE_PROVIDER_SITE_OTHER)
Admission: RE | Admit: 2012-03-01 | Discharge: 2012-03-01 | Disposition: A | Discharge status: Home / Self Care | Payer: Medicare Other | Source: Ambulatory Visit | Attending: Cardiology | Admitting: Cardiology

## 2012-03-01 DIAGNOSIS — R0602 Shortness of breath: Secondary | ICD-10-CM

## 2012-03-06 ENCOUNTER — Telehealth: Payer: Self-pay | Admitting: *Deleted

## 2012-03-06 NOTE — Telephone Encounter (Signed)
Message copied by Burnell Blanks on Mon Mar 06, 2012  5:40 PM ------      Message from: Cassell Clement      Created: Wed Mar 01, 2012  7:17 PM       The chest x-ray shows mild cardiomegaly not significantly changed from 2011.  Her lungs do not show any congestive heart failure or pneumonia or other abnormality within for some hyperinflation compatible with COPD.  Continue present meds.

## 2012-03-06 NOTE — Telephone Encounter (Signed)
Patient increase her HCTZ to 1/2 everyday since swelling not much better, couldn't wear her shoes. Will forward to  Dr. Patty Sermons for review

## 2012-03-06 NOTE — Telephone Encounter (Signed)
Okay to take the HCTZ one half tablet every day and see if edema improves

## 2012-03-07 NOTE — Telephone Encounter (Signed)
Advised patient

## 2012-03-09 ENCOUNTER — Telehealth: Payer: Self-pay | Admitting: *Deleted

## 2012-03-09 NOTE — Telephone Encounter (Signed)
Advised patient

## 2012-03-09 NOTE — Telephone Encounter (Signed)
Increase hydrochlorothiazide to a full tablet daily and observe response

## 2012-03-09 NOTE — Telephone Encounter (Signed)
Taking 1/2 HCTZ and swelling no better. Swelling does go down at night but as soon as up walking around swelling back.  Has not noticed much of an increase in urinary output.  Will forward to  Dr. Patty Sermons for review

## 2012-03-30 ENCOUNTER — Other Ambulatory Visit (INDEPENDENT_AMBULATORY_CARE_PROVIDER_SITE_OTHER): Payer: Medicare Other

## 2012-03-30 DIAGNOSIS — I119 Hypertensive heart disease without heart failure: Secondary | ICD-10-CM

## 2012-03-30 LAB — BASIC METABOLIC PANEL
BUN: 32 mg/dL — ABNORMAL HIGH (ref 6–23)
CO2: 29 mEq/L (ref 19–32)
Calcium: 9.1 mg/dL (ref 8.4–10.5)
Chloride: 100 mEq/L (ref 96–112)
Creatinine, Ser: 0.9 mg/dL (ref 0.4–1.2)
GFR: 62.4 mL/min (ref >60.00)
Glucose, Bld: 86 mg/dL (ref 70–99)
Potassium: 4 mEq/L (ref 3.5–5.1)
Sodium: 136 mEq/L (ref 135–145)

## 2012-03-30 LAB — HEPATIC FUNCTION PANEL
ALT: 14 U/L (ref 0–35)
AST: 24 U/L (ref 0–37)
Albumin: 3.8 g/dL (ref 3.5–5.2)
Alkaline Phosphatase: 69 U/L (ref 39–117)
Bilirubin, Direct: 0.1 mg/dL (ref 0.0–0.3)
Total Bilirubin: 1 mg/dL (ref 0.3–1.2)
Total Protein: 7.1 g/dL (ref 6.0–8.3)

## 2012-03-30 LAB — LIPID PANEL
Cholesterol: 148 mg/dL (ref 0–200)
HDL: 71.8 mg/dL (ref >39.00)
LDL Cholesterol: 65 mg/dL (ref 0–99)
Total CHOL/HDL Ratio: 2
Triglycerides: 57 mg/dL (ref 0.0–149.0)
VLDL: 11.4 mg/dL (ref 0.0–40.0)

## 2012-03-30 NOTE — Progress Notes (Signed)
Quick Note:  Please make copy of labs for patient visit. ______ 

## 2012-04-03 ENCOUNTER — Ambulatory Visit (INDEPENDENT_AMBULATORY_CARE_PROVIDER_SITE_OTHER): Payer: Medicare Other | Admitting: Cardiology

## 2012-04-03 ENCOUNTER — Encounter: Payer: Self-pay | Admitting: Cardiology

## 2012-04-03 VITALS — BP 128/62 | HR 50 | Resp 18 | Ht 61.0 in | Wt 125.0 lb

## 2012-04-03 DIAGNOSIS — I493 Ventricular premature depolarization: Secondary | ICD-10-CM

## 2012-04-03 DIAGNOSIS — Z79899 Other long term (current) drug therapy: Secondary | ICD-10-CM

## 2012-04-03 DIAGNOSIS — M1990 Unspecified osteoarthritis, unspecified site: Secondary | ICD-10-CM | POA: Insufficient documentation

## 2012-04-03 DIAGNOSIS — I4949 Other premature depolarization: Secondary | ICD-10-CM

## 2012-04-03 DIAGNOSIS — I119 Hypertensive heart disease without heart failure: Secondary | ICD-10-CM

## 2012-04-03 MED ORDER — FUROSEMIDE 20 MG PO TABS: 20.0000 mg | ORAL_TABLET | Freq: Every day | ORAL | Status: DC

## 2012-04-03 NOTE — Progress Notes (Signed)
Glenda [name] Date of Birth:  04/22/1924 [facility] [address] [place], [place]  [address] [phone]         Fax   [phone]  History of Present Illness: This pleasant 79 year old woman is seen for a followup office visit.  She was last seen in December 2013 at which time  she had had worsening peripheral edema for the past 3 or 4 weeks. She has a history of atypical chest pain and a history of palpitations. She had a exercise treadmill test on 01/26/11 which showed no evidence of ischemia. She did have occasional unifocal PVCs during exercise and recovery but no ventricular tachycardia. He exercised for 4 minutes the test was stopped because of fatigue. Recently she has been feeling better. Her palpitations have improved. He has a history of valvular heart disease with an echocardiogram on 02/19/11 any evidence of mild aortic insufficiency and mild aortic stenosis and normal LV systolic function. She is not having any symptoms of CHF. Recently she has been having more problems from an orthopedic standpoint. She has had chronic low back pain. Dr. Norlene Campbell is her orthopedist. She has also been seeing a physiatrist Dr. Alvester Morin. She has had several injections for spinal stenosis without much improvement. Recently she has also developed a painful right heel spur which has interfered with her walking. Her back is still giving her a lot of trouble and she has an appointment to see Dr. Danielle Dess in February. She states that she hopes that she does not need to have any surgery.   Current Outpatient Prescriptions  Medication Sig Dispense Refill  . alum & mag hydroxide-simeth (MAALOX/MYLANTA) 200-200-20 MG/5ML suspension Take 5 mLs by mouth as needed.      Marland Kitchen amLODipine (NORVASC) 2.5 MG tablet Take 2.5 mg by mouth 2 (two) times daily.       Marland Kitchen aspirin 81 MG tablet Take 81 mg by mouth daily.      Marland Kitchen atorvastatin (LIPITOR) 20 MG tablet Take 1 tablet (20 mg total) by mouth  daily.  90 tablet  3  . Cholecalciferol (VITAMIN D) 1000 UNITS capsule Take 1,000 Units by mouth daily.        . hydrochlorothiazide (HYDRODIURIL) 25 MG tablet Take 1 tablet (25 mg total) by mouth as directed. 1/2 tablet every other day  15 tablet  3  . losartan (COZAAR) 100 MG tablet Take 1 tablet (100 mg total) by mouth daily.  90 tablet  3  . metoprolol succinate (TOPROL-XL) 25 MG 24 hr tablet Take 1 tablet (25 mg total) by mouth daily. Take with or immediately following a meal.  30 tablet  11  . Multiple Vitamin (MULITIVITAMIN WITH MINERALS) TABS Take 1 tablet by mouth daily.      . polyethylene glycol (MIRALAX / GLYCOLAX) packet Take 17 g by mouth as needed.      . temazepam (RESTORIL) 15 MG capsule Take 1 capsule (15 mg total) by mouth at bedtime as needed. Insomnia  30 capsule  5  . vitamin C (ASCORBIC ACID) 500 MG tablet Take 500 mg by mouth daily.      . furosemide (LASIX) 20 MG tablet Take 1 tablet (20 mg total) by mouth daily.  30 tablet  5    Allergies  Allergen Reactions  . Amitriptyline     hyper  . Avelox (Moxifloxacin Hcl In Nacl) Other (See Comments)    dizziness  . Halcion (Triazolam)   . Pentazocine Lactate   .  Sulfa Drugs Cross Reactors   . Trazodone And Nefazodone     Not effective    Patient Active Problem List  Diagnosis  . Insomnia  . Benign hypertensive heart disease without heart failure  . Hypercholesterolemia  . Dizziness  . PVC (premature ventricular contraction)  . Hypertension  . Anemia  . Epistaxis  . Heart murmur, aortic    History  Smoking status  . Never Smoker   Smokeless tobacco  . Not on file    History  Alcohol Use No    Family History  Problem Relation Age of Onset  . Heart disease Mother   . Hypertension Father   . Heart disease Sister     Review of Systems: Constitutional: no fever chills diaphoresis or fatigue or change in weight.  Head and neck: no hearing loss, no epistaxis, no photophobia or visual  disturbance. Respiratory: No cough, shortness of breath or wheezing. Cardiovascular: No chest pain peripheral edema, palpitations. Gastrointestinal: No abdominal distention, no abdominal pain, no change in bowel habits hematochezia or melena. Genitourinary: No dysuria, no frequency, no urgency, no nocturia. Musculoskeletal:No arthralgias, no back pain, no gait disturbance or myalgias. Neurological: No dizziness, no headaches, no numbness, no seizures, no syncope, no weakness, no tremors. Hematologic: No lymphadenopathy, no easy bruising. Psychiatric: No confusion, no hallucinations, no sleep disturbance.    Physical Exam: Filed Vitals:   04/03/12 1014  BP: 128/62  Pulse: 50  Resp: 18   the general appearance reveals a thin elderly woman in no acute distress.The head and neck exam reveals pupils equal and reactive.  Extraocular movements are full.  There is no scleral icterus.  The mouth and pharynx are normal.  The neck is supple.  The carotids reveal no bruits.  The jugular venous pressure is normal.  The  thyroid is not enlarged.  There is no lymphadenopathy.  The chest is clear to percussion and auscultation.  There are no rales or rhonchi.  Expansion of the chest is symmetrical.  The precordium is quiet.  The first heart sound is normal.  The second heart sound is physiologically split.  There is no murmur gallop rub or click.  There is no abnormal lift or heave.  The abdomen is soft and nontender.  The bowel sounds are normal.  The liver and spleen are not enlarged.  There are no abdominal masses.  There are no abdominal bruits.  Extremities reveal good pedal pulses.  There is no phlebitis or edema.  There is no cyanosis or clubbing.  Strength is normal and symmetrical in all extremities.  There is no lateralizing weakness.  There are no sensory deficits.  The skin is warm and dry.  There is no rash.     Assessment / Plan: Continue same medication except stop hydrochlorothiazide and  start furosemide 20 mg daily.  She will return for blood work in 2 weeks.  Recheck in 3 months for followup office visit and basal metabolic

## 2012-04-03 NOTE — Assessment & Plan Note (Signed)
The patient states that her PVCs have been a little worse over the past week.  She is not having any dizziness or syncope

## 2012-04-03 NOTE — Assessment & Plan Note (Signed)
The patient continues to have a lot of back pain.  She has an appointment to see Dr. Danielle Dess early February.

## 2012-04-03 NOTE — Patient Instructions (Signed)
STOP HYDROCHLORATHIAZIDE AND START LASIX (FUROSEMIDE) 20 MG DAILY, RX SENT TO [facility]  FOLLOW UP LABS IN 2 WEEKS (BMET)  Your physician recommends that you schedule a follow-up appointment in: 3 MONTHS OV/BMET

## 2012-04-03 NOTE — Assessment & Plan Note (Signed)
She is now taking hydrochlorothiazide 25 mg every day.  Her edema has improved but it is still moderate by the end of the day.  She would like to try something a little stronger and we will stop hydrochlorothiazide and start furosemide 20 mg one daily.  Since it is stronger we will have her return in 2 weeks for a followup basal metabolic panel.  The patient has not been having any orthopnea or paroxysmal nocturnal dyspnea.

## 2012-04-17 ENCOUNTER — Other Ambulatory Visit (INDEPENDENT_AMBULATORY_CARE_PROVIDER_SITE_OTHER): Payer: Medicare Other

## 2012-04-17 DIAGNOSIS — Z79899 Other long term (current) drug therapy: Secondary | ICD-10-CM

## 2012-04-17 LAB — BASIC METABOLIC PANEL
BUN: 29 mg/dL — ABNORMAL HIGH (ref 6–23)
CO2: 30 mEq/L (ref 19–32)
Calcium: 9.3 mg/dL (ref 8.4–10.5)
Chloride: 100 mEq/L (ref 96–112)
Creatinine, Ser: 0.8 mg/dL (ref 0.4–1.2)
GFR: 71.36 mL/min (ref >60.00)
Glucose, Bld: 86 mg/dL (ref 70–99)
Potassium: 3.8 mEq/L (ref 3.5–5.1)
Sodium: 137 mEq/L (ref 135–145)

## 2012-04-26 ENCOUNTER — Telehealth: Payer: Self-pay | Admitting: *Deleted

## 2012-04-26 NOTE — Telephone Encounter (Signed)
Patient states swelling in legs is much better with the Lasix, still has a little. Patient wanting to know if she should continue Lasix daily, just use as needed, or what? Will forward to  Dr. Patty Sermons for review

## 2012-04-26 NOTE — Telephone Encounter (Signed)
Message copied by Burnell Blanks on Wed Apr 26, 2012  5:22 PM ------      Message from: Cassell Clement      Created: Mon Apr 17, 2012  8:38 PM       Please report. Kidney function remains stable after switching from HCTZ to lasix. CSD. K+ is 3.8 so eat plenty of high k+  foods

## 2012-04-26 NOTE — Telephone Encounter (Signed)
Advised patient

## 2012-04-26 NOTE — Telephone Encounter (Signed)
Would keep taking it daily as she has been doing.

## 2012-06-01 ENCOUNTER — Telehealth: Payer: Self-pay | Admitting: Cardiology

## 2012-06-01 DIAGNOSIS — N390 Urinary tract infection, site not specified: Secondary | ICD-10-CM

## 2012-06-01 MED ORDER — CIPROFLOXACIN HCL 500 MG PO TABS: 500.0000 mg | ORAL_TABLET | Freq: Two times a day (BID) | ORAL | Status: DC

## 2012-06-01 NOTE — Telephone Encounter (Signed)
Patient states she feels like getting another UTI. Denies fever just frequency.  Allergic to sulfa and Avelox. Reviewed chart and Cipro given in 2012 for UTI. Will forward to  Dr. Patty Sermons for review

## 2012-06-01 NOTE — Telephone Encounter (Signed)
New problem    C/O UTI . No PCP was contacted.    Patient is asking for same Rx that was previous called in .

## 2012-06-01 NOTE — Telephone Encounter (Signed)
Advised pateint

## 2012-06-01 NOTE — Telephone Encounter (Signed)
We can call in Cipro 500 mg twice a day #10

## 2012-07-06 ENCOUNTER — Other Ambulatory Visit (INDEPENDENT_AMBULATORY_CARE_PROVIDER_SITE_OTHER): Payer: Medicare Other

## 2012-07-06 ENCOUNTER — Other Ambulatory Visit: Payer: Self-pay | Admitting: Cardiology

## 2012-07-06 DIAGNOSIS — Z79899 Other long term (current) drug therapy: Secondary | ICD-10-CM

## 2012-07-06 DIAGNOSIS — G4700 Insomnia, unspecified: Secondary | ICD-10-CM

## 2012-07-06 DIAGNOSIS — I119 Hypertensive heart disease without heart failure: Secondary | ICD-10-CM

## 2012-07-06 LAB — BASIC METABOLIC PANEL
BUN: 27 mg/dL — ABNORMAL HIGH (ref 6–23)
CO2: 27 mEq/L (ref 19–32)
Calcium: 9.2 mg/dL (ref 8.4–10.5)
Chloride: 100 mEq/L (ref 96–112)
Creatinine, Ser: 0.9 mg/dL (ref 0.4–1.2)
GFR: 60.07 mL/min (ref >60.00)
Glucose, Bld: 87 mg/dL (ref 70–99)
Potassium: 4.3 mEq/L (ref 3.5–5.1)
Sodium: 137 mEq/L (ref 135–145)

## 2012-07-06 MED ORDER — TEMAZEPAM 15 MG PO CAPS: 15.0000 mg | ORAL_CAPSULE | Freq: Every evening | ORAL | Status: DC | PRN

## 2012-07-06 NOTE — Telephone Encounter (Signed)
Follow Up    Pt calling in following up on authorization of RESTORIL medication. Would like to speak to nurse.

## 2012-07-06 NOTE — Progress Notes (Signed)
Quick Note:  Please make copy of labs for patient visit. ______ 

## 2012-07-10 ENCOUNTER — Encounter: Payer: Self-pay | Admitting: *Deleted

## 2012-07-10 ENCOUNTER — Other Ambulatory Visit (INDEPENDENT_AMBULATORY_CARE_PROVIDER_SITE_OTHER): Payer: Medicare Other

## 2012-07-10 DIAGNOSIS — I119 Hypertensive heart disease without heart failure: Secondary | ICD-10-CM

## 2012-07-10 LAB — LIPID PANEL
Cholesterol: 169 mg/dL (ref 0–200)
HDL: 83.1 mg/dL (ref >39.00)
LDL Cholesterol: 72 mg/dL (ref 0–99)
Total CHOL/HDL Ratio: 2
Triglycerides: 68 mg/dL (ref 0.0–149.0)
VLDL: 13.6 mg/dL (ref 0.0–40.0)

## 2012-07-10 NOTE — Progress Notes (Signed)
Quick Note:  Please make copy of labs for patient visit. ______ 

## 2012-07-11 ENCOUNTER — Ambulatory Visit (INDEPENDENT_AMBULATORY_CARE_PROVIDER_SITE_OTHER): Payer: Medicare Other | Admitting: Cardiology

## 2012-07-11 ENCOUNTER — Encounter: Payer: Self-pay | Admitting: Cardiology

## 2012-07-11 VITALS — BP 134/68 | HR 51 | Ht 62.0 in | Wt 126.4 lb

## 2012-07-11 DIAGNOSIS — I359 Nonrheumatic aortic valve disorder, unspecified: Secondary | ICD-10-CM

## 2012-07-11 DIAGNOSIS — I5032 Chronic diastolic (congestive) heart failure: Secondary | ICD-10-CM | POA: Insufficient documentation

## 2012-07-11 DIAGNOSIS — I10 Essential (primary) hypertension: Secondary | ICD-10-CM

## 2012-07-11 DIAGNOSIS — R609 Edema, unspecified: Secondary | ICD-10-CM

## 2012-07-11 DIAGNOSIS — I119 Hypertensive heart disease without heart failure: Secondary | ICD-10-CM

## 2012-07-11 DIAGNOSIS — I493 Ventricular premature depolarization: Secondary | ICD-10-CM

## 2012-07-11 DIAGNOSIS — E7800 Pure hypercholesterolemia, unspecified: Secondary | ICD-10-CM

## 2012-07-11 DIAGNOSIS — I4949 Other premature depolarization: Secondary | ICD-10-CM

## 2012-07-11 DIAGNOSIS — I350 Nonrheumatic aortic (valve) stenosis: Secondary | ICD-10-CM

## 2012-07-11 MED ORDER — METOPROLOL SUCCINATE ER 25 MG PO TB24: 25.0000 mg | ORAL_TABLET | Freq: Every day | ORAL | Status: DC

## 2012-07-11 NOTE — Patient Instructions (Addendum)
Your physician recommends that you continue on your current medications as directed. Please refer to the Current Medication list given to you today.  Your physician recommends that you schedule a follow-up appointment in: 3 month ov/ekg/bmet 

## 2012-07-11 NOTE — Assessment & Plan Note (Signed)
Her PVCs have improved and she is less aware of palpitations recently

## 2012-07-11 NOTE — Assessment & Plan Note (Signed)
Her blood pressure has improved on small dose of amlodipine 2.5 mg twice a day.  She is having a small amount of pedal edema but not significant.  Her echocardiogram has shown grade 2 diastolic dysfunction but normal systolic function with an ejection fraction of 55-60%.  She has only occasional dyspnea and takes an occasional Lasix but not on a regular basis.

## 2012-07-11 NOTE — Progress Notes (Signed)
Glenda [name] Date of Birth:  June 26, 1924 [facility] [address] [place], [place]  [address] [phone]         Fax   [phone]  History of Present Illness: This pleasant 79 year old woman is seen for a followup office visit. She was last seen in January 2014 at which time she had had worsening peripheral edema for the past 3 or 4 weeks. She has a history of atypical chest pain and a history of palpitations. She had a exercise treadmill test on 01/26/11 which showed no evidence of ischemia. She did have occasional unifocal PVCs during exercise and recovery but no ventricular tachycardia. He exercised for 4 minutes the test was stopped because of fatigue. Recently she has been feeling better. Her palpitations have improved. He has a history of valvular heart disease with an echocardiogram on 02/19/11 any evidence of mild aortic insufficiency and mild aortic stenosis and normal LV systolic function. She is not having any symptoms of CHF. Recently she has been having more problems from an orthopedic standpoint. She has had chronic low back pain. Dr. Norlene Campbell is her orthopedist. She has also been seeing a physiatrist Dr. Alvester Morin. She has had several injections for spinal stenosis without much improvement. Recently she has also developed a painful right heel spur which has interfered with her walking. Her back is still giving her a lot of trouble she recently saw Dr. Danielle Dess who also gave her a shot which helped for a short time. She states that she hopes that she does not need to have any surgery.   Current Outpatient Prescriptions  Medication Sig Dispense Refill  . alum & mag hydroxide-simeth (MAALOX/MYLANTA) 200-200-20 MG/5ML suspension Take 5 mLs by mouth as needed.      Marland Kitchen amLODipine (NORVASC) 2.5 MG tablet Take 2.5 mg by mouth 2 (two) times daily.       Marland Kitchen aspirin 81 MG tablet Take 81 mg by mouth daily.      Marland Kitchen atorvastatin (LIPITOR) 20 MG tablet Take 1 tablet (20  mg total) by mouth daily.  90 tablet  3  . Cholecalciferol (VITAMIN D) 1000 UNITS capsule Take 1,000 Units by mouth daily.        Marland Kitchen losartan (COZAAR) 100 MG tablet Take 1 tablet (100 mg total) by mouth daily.  90 tablet  3  . metoprolol succinate (TOPROL-XL) 25 MG 24 hr tablet Take 1 tablet (25 mg total) by mouth daily. Take with or immediately following a meal.  90 tablet  3  . Multiple Vitamin (MULITIVITAMIN WITH MINERALS) TABS Take 1 tablet by mouth daily.      . polyethylene glycol (MIRALAX / GLYCOLAX) packet Take 17 g by mouth as needed.      . temazepam (RESTORIL) 15 MG capsule Take 1 capsule (15 mg total) by mouth at bedtime as needed. Insomnia  30 capsule  5  . vitamin C (ASCORBIC ACID) 500 MG tablet Take 500 mg by mouth daily.       No current facility-administered medications for this visit.    Allergies  Allergen Reactions  . Amitriptyline     hyper  . Avelox (Moxifloxacin Hcl In Nacl) Other (See Comments)    dizziness  . Halcion (Triazolam)   . Pentazocine Lactate   . Sulfa Drugs Cross Reactors   . Trazodone And Nefazodone     Not effective    Patient Active Problem List  Diagnosis  . Insomnia  . Benign hypertensive heart disease without  heart failure  . Hypercholesterolemia  . Dizziness  . PVC (premature ventricular contraction)  . Hypertension  . Anemia  . Epistaxis  . Heart murmur, aortic  . Osteoarthritis  . Peripheral edema  . Aortic stenosis, mild  . Chronic diastolic HF (heart failure)    History  Smoking status  . Never Smoker   Smokeless tobacco  . Not on file    History  Alcohol Use No    Family History  Problem Relation Age of Onset  . Heart disease Mother   . Hypertension Father   . Heart disease Sister     Review of Systems: Constitutional: no fever chills diaphoresis or fatigue or change in weight.  Head and neck: no hearing loss, no epistaxis, no photophobia or visual disturbance. Respiratory: No cough, shortness of breath or  wheezing. Cardiovascular: No chest pain peripheral edema, palpitations. Gastrointestinal: No abdominal distention, no abdominal pain, no change in bowel habits hematochezia or melena. Genitourinary: No dysuria, no frequency, no urgency, no nocturia. Musculoskeletal:No arthralgias, no back pain, no gait disturbance or myalgias. Neurological: No dizziness, no headaches, no numbness, no seizures, no syncope, no weakness, no tremors. Hematologic: No lymphadenopathy, no easy bruising. Psychiatric: No confusion, no hallucinations, no sleep disturbance.    Physical Exam: Filed Vitals:   07/11/12 1018  BP: 134/68  Pulse: 51   the general appearance reveals a thin petite elderly woman in no distress.The head and neck exam reveals pupils equal and reactive.  Extraocular movements are full.  There is no scleral icterus.  The mouth and pharynx are normal.  The neck is supple.  The carotids reveal no bruits.  The jugular venous pressure is normal.  The  thyroid is not enlarged.  There is no lymphadenopathy.  The chest is clear to percussion and auscultation.  There are no rales or rhonchi.  Expansion of the chest is symmetrical.  The precordium is quiet.  The first heart sound is normal.  The second heart sound is physiologically split.  There is no murmur gallop rub or click.  There is no abnormal lift or heave.  The abdomen is soft and nontender.  The bowel sounds are normal.  The liver and spleen are not enlarged.  There are no abdominal masses.  There are no abdominal bruits.  Extremities reveal good pedal pulses.  There is no phlebitis or edema.  There is no cyanosis or clubbing.  Strength is normal and symmetrical in all extremities.  There is no lateralizing weakness.  There are no sensory deficits.  The skin is warm and dry.  There is no rash.     Assessment / Plan:  Continue same medication.  Recheck in 3 months for office visit EKG and basal metabolic panel.  She will followup with Dr. Danielle Dess  regarding her ongoing low back pain.

## 2012-07-11 NOTE — Assessment & Plan Note (Signed)
Patient has hypercholesterolemia.  She is on low-dose Lipitor 20 mg daily.  Recent labs are acceptable and she has a very high HDL which is reassuring

## 2012-08-02 ENCOUNTER — Telehealth: Payer: Self-pay | Admitting: Cardiology

## 2012-08-02 MED ORDER — FUROSEMIDE 20 MG PO TABS: 20.0000 mg | ORAL_TABLET | Freq: Every day | ORAL | Status: DC | PRN

## 2012-08-02 NOTE — Telephone Encounter (Signed)
The amlodipine is contributing to her swelling.  Stop the amlodipine. Also the meloxicam may be causing fluid retention and she should use as little of that as she can get by with.

## 2012-08-02 NOTE — Telephone Encounter (Signed)
Discussed swelling further with  Dr. Patty Sermons and will Rx Lasix 20 mg daily as needed for swelling. Advised patient.

## 2012-08-02 NOTE — Telephone Encounter (Signed)
Swelling in feet and ankles worse since last visit, getting to the point can hardly put on shoes. No change in shortness of breath. Also taking Meloxicam for sciatica. Will forward to  Dr. Patty Sermons for review

## 2012-08-02 NOTE — Telephone Encounter (Signed)
New Prob     Pt has some questions regarding AMLODIPINE. Experiencing a lot swelling in ankles and feet. Please call.

## 2012-08-21 ENCOUNTER — Other Ambulatory Visit: Payer: Self-pay | Admitting: *Deleted

## 2012-08-21 MED ORDER — ATORVASTATIN CALCIUM 20 MG PO TABS: 20.0000 mg | ORAL_TABLET | Freq: Every day | ORAL | Status: DC

## 2012-08-25 ENCOUNTER — Other Ambulatory Visit: Payer: Self-pay | Admitting: Neurological Surgery

## 2012-08-25 DIAGNOSIS — M5416 Radiculopathy, lumbar region: Secondary | ICD-10-CM

## 2012-08-31 ENCOUNTER — Emergency Department (HOSPITAL_COMMUNITY): Payer: Medicare Other

## 2012-08-31 ENCOUNTER — Emergency Department (HOSPITAL_COMMUNITY)
Admission: EM | Admit: 2012-08-31 | Discharge: 2012-08-31 | Disposition: A | Discharge status: Home / Self Care | Payer: Medicare Other | Attending: Emergency Medicine | Admitting: Emergency Medicine

## 2012-08-31 ENCOUNTER — Encounter (HOSPITAL_COMMUNITY): Payer: Self-pay | Admitting: *Deleted

## 2012-08-31 DIAGNOSIS — R269 Unspecified abnormalities of gait and mobility: Secondary | ICD-10-CM | POA: Insufficient documentation

## 2012-08-31 DIAGNOSIS — Y998 Other external cause status: Secondary | ICD-10-CM | POA: Insufficient documentation

## 2012-08-31 DIAGNOSIS — Z79899 Other long term (current) drug therapy: Secondary | ICD-10-CM | POA: Insufficient documentation

## 2012-08-31 DIAGNOSIS — Z7982 Long term (current) use of aspirin: Secondary | ICD-10-CM | POA: Insufficient documentation

## 2012-08-31 DIAGNOSIS — I10 Essential (primary) hypertension: Secondary | ICD-10-CM | POA: Insufficient documentation

## 2012-08-31 DIAGNOSIS — X58XXXA Exposure to other specified factors, initial encounter: Secondary | ICD-10-CM | POA: Insufficient documentation

## 2012-08-31 DIAGNOSIS — Z96659 Presence of unspecified artificial knee joint: Secondary | ICD-10-CM | POA: Insufficient documentation

## 2012-08-31 DIAGNOSIS — M8448XA Pathological fracture, other site, initial encounter for fracture: Secondary | ICD-10-CM

## 2012-08-31 DIAGNOSIS — Z8719 Personal history of other diseases of the digestive system: Secondary | ICD-10-CM | POA: Insufficient documentation

## 2012-08-31 DIAGNOSIS — Y929 Unspecified place or not applicable: Secondary | ICD-10-CM | POA: Insufficient documentation

## 2012-08-31 DIAGNOSIS — M545 Low back pain, unspecified: Secondary | ICD-10-CM | POA: Insufficient documentation

## 2012-08-31 DIAGNOSIS — IMO0002 Reserved for concepts with insufficient information to code with codable children: Secondary | ICD-10-CM | POA: Insufficient documentation

## 2012-08-31 DIAGNOSIS — M51379 Other intervertebral disc degeneration, lumbosacral region without mention of lumbar back pain or lower extremity pain: Secondary | ICD-10-CM | POA: Insufficient documentation

## 2012-08-31 DIAGNOSIS — Y93F2 Activity, caregiving, lifting: Secondary | ICD-10-CM | POA: Insufficient documentation

## 2012-08-31 DIAGNOSIS — M1710 Unilateral primary osteoarthritis, unspecified knee: Secondary | ICD-10-CM | POA: Insufficient documentation

## 2012-08-31 DIAGNOSIS — E785 Hyperlipidemia, unspecified: Secondary | ICD-10-CM | POA: Insufficient documentation

## 2012-08-31 DIAGNOSIS — M5137 Other intervertebral disc degeneration, lumbosacral region: Secondary | ICD-10-CM | POA: Insufficient documentation

## 2012-08-31 DIAGNOSIS — Z8673 Personal history of transient ischemic attack (TIA), and cerebral infarction without residual deficits: Secondary | ICD-10-CM | POA: Insufficient documentation

## 2012-08-31 DIAGNOSIS — G8929 Other chronic pain: Secondary | ICD-10-CM | POA: Insufficient documentation

## 2012-08-31 DIAGNOSIS — Z8679 Personal history of other diseases of the circulatory system: Secondary | ICD-10-CM | POA: Insufficient documentation

## 2012-08-31 DIAGNOSIS — S3210XA Unspecified fracture of sacrum, initial encounter for closed fracture: Secondary | ICD-10-CM | POA: Insufficient documentation

## 2012-08-31 MED ORDER — OXYCODONE-ACETAMINOPHEN 5-325 MG PO TABS
1.0000 | ORAL_TABLET | Freq: Once | ORAL | Status: AC
  Administered 2012-08-31: 1 via ORAL
  Filled 2012-08-31: qty 1

## 2012-08-31 MED ORDER — OXYCODONE-ACETAMINOPHEN 5-325 MG PO TABS: 1.0000 | ORAL_TABLET | Freq: Three times a day (TID) | ORAL | Status: DC | PRN

## 2012-08-31 NOTE — ED Notes (Signed)
Pt returned from mri blankets given

## 2012-08-31 NOTE — ED Provider Notes (Signed)
History    This chart was scribed for non-physician practitioner Dahlia Client Chistopher Mangino working with Osvaldo Human, MD by Quintella Reichert, ED Scribe. This patient was seen in room TR11C/TR11C and the patient's care was started at 5:40 PM .   CSN: [id number]  Arrival date & time 08/31/12  1701      Chief Complaint  Patient presents with  . Back Pain     The history is provided by the patient. No language interpreter was used.    HPI Comments: Glenda [name] is a 79 y.o. female who presents to the Emergency Department complaining of recurrent lower back pain that began 2 years ago but has became much more severe and spread into the right hip in the past 2 weeks.  Pt notes that back pain initially presented 2 years ago when she was caring for her husband and she lifted him and fell.  She subsequently began to experience intermittent episodes of moderate lower back pain.  6 months ago she reports she tripped on a phone cord and fell again.  She notes that since her initial fall she has been diagnosed with a bulging disc and lumbar stenosis, and has been receiving cortisone shots.  Pt states that her present pain is similar to prior episodes of back pain, but in past episodes it was less severe and was not also in her right hip.  She notes that pain is greatly exacerbated by standing and walking and is usually relieved by lying flat on her back.  She attempted to treat pain with Tramadol, with no relief.  She states that for the past 2 days she has been unable to ambulate without assistance due to pain, which is not typical for pt.  She denies loss of motor control.  She has also been shuffling for 2-3 weeks, but more so in the past week.  Pt's daughter notes that pt has h/o 2 right knee replacements and has had some limp since that surgery, but she denies h/o shuffling as much as presently.  Pt denies recent falls that may have brought on sudden worsening of symptoms.  Her last x-ray was several  months ago.  She is scheduled for an MRI 3 days from now.  She denies urinary symptoms, bowel symptoms, or numbness or tingling to groin or legs.  Pt has h/o degenerative disc disease, HTN, PVCs and peripheral edema.  She denies h/o DM.    Past Medical History  Diagnosis Date  . Hypertension   . Peripheral edema   . PVCs (premature ventricular contractions)   . TIA (transient ischemic attack)   . Hiatal hernia   . Diverticulosis   . OA (osteoarthritis) of knee   . Hypercholesterolemia   . Insomnia   . DDD (degenerative disc disease), lumbar     severe facet dz and adv DDD MRI L spine 2009  . Aortic stenosis     Echo 02/2011 showing mild AS with normal LV systolic function    Past Surgical History  Procedure Laterality Date  . Cardiac catheterization  02/17/2001    MILD REGURGITATION. EF 60%  . Colonoscopy    . Cesarean section    . Hemorroidectomy    . Breast biopsy      LEFT BREAST    Family History  Problem Relation Age of Onset  . Heart disease Mother   . Hypertension Father   . Heart disease Sister     History  Substance Use Topics  . Smoking  status: Never Smoker   . Smokeless tobacco: Not on file  . Alcohol Use: No    OB History   Grav Para Term Preterm Abortions TAB SAB Ect Mult Living                  Review of Systems  Constitutional: Negative for diaphoresis, appetite change, fatigue and unexpected weight change.  HENT: Negative for mouth sores and neck stiffness.   Eyes: Negative for visual disturbance.  Respiratory: Negative for cough, chest tightness, shortness of breath and wheezing.   Gastrointestinal: Negative for nausea, vomiting, diarrhea and constipation.  Endocrine: Negative for polydipsia, polyphagia and polyuria.  Genitourinary: Negative for dysuria, urgency, frequency and hematuria.  Musculoskeletal: Positive for back pain.  Skin: Negative for rash.  Allergic/Immunologic: Negative for immunocompromised state.  Neurological: Negative  for syncope, weakness, light-headedness and numbness.  Hematological: Does not bruise/bleed easily.  Psychiatric/Behavioral: Negative for sleep disturbance. The patient is not nervous/anxious.     Allergies  Amitriptyline; Avelox; Halcion; Pentazocine lactate; Sulfa drugs cross reactors; and Trazodone and nefazodone  Home Medications   Current Outpatient Rx  Name  Route  Sig  Dispense  Refill  . alum & mag hydroxide-simeth (MAALOX/MYLANTA) 200-200-20 MG/5ML suspension   Oral   Take 5 mLs by mouth as needed. For indigestion         . aspirin 81 MG tablet   Oral   Take 81 mg by mouth daily.         Marland Kitchen atorvastatin (LIPITOR) 20 MG tablet   Oral   Take 1 tablet (20 mg total) by mouth daily.   90 tablet   3   . Cholecalciferol (VITAMIN D) 1000 UNITS capsule   Oral   Take 1,000 Units by mouth daily.           . furosemide (LASIX) 20 MG tablet   Oral   Take 1 tablet (20 mg total) by mouth daily as needed.   30 tablet   1   . losartan (COZAAR) 100 MG tablet   Oral   Take 1 tablet (100 mg total) by mouth daily.   90 tablet   3   . meloxicam (MOBIC) 7.5 MG tablet   Oral   Take 7.5 mg by mouth daily.         . metoprolol succinate (TOPROL-XL) 25 MG 24 hr tablet   Oral   Take 1 tablet (25 mg total) by mouth daily. Take with or immediately following a meal.   90 tablet   3   . Multiple Vitamin (MULITIVITAMIN WITH MINERALS) TABS   Oral   Take 1 tablet by mouth daily.         . polyethylene glycol (MIRALAX / GLYCOLAX) packet   Oral   Take 17 g by mouth as needed. For constipation         . temazepam (RESTORIL) 15 MG capsule   Oral   Take 1 capsule (15 mg total) by mouth at bedtime as needed. Insomnia   30 capsule   5   . traMADol (ULTRAM) 50 MG tablet   Oral   Take 50 mg by mouth every 6 (six) hours as needed for pain.         . vitamin B-12 (CYANOCOBALAMIN) 500 MCG tablet   Oral   Take 500 mcg by mouth daily.         . vitamin C (ASCORBIC  ACID) 500 MG tablet   Oral   Take 500  mg by mouth daily.         Marland Kitchen oxyCODONE-acetaminophen (PERCOCET/ROXICET) 5-325 MG per tablet   Oral   Take 1 tablet by mouth every 8 (eight) hours as needed for pain.   6 tablet   0     BP 171/93  Pulse 57  Resp 20  Ht 5\' 2"  (1.575 m)  Wt 124 lb (56.246 kg)  BMI 22.67 kg/m2  SpO2 100%  Physical Exam  Nursing note and vitals reviewed. Constitutional: She is oriented to person, place, and time. She appears well-developed and well-nourished. No distress.  HENT:  Head: Normocephalic and atraumatic.  Mouth/Throat: Oropharynx is clear and moist. No oropharyngeal exudate.  Eyes: Conjunctivae are normal.  Neck: Normal range of motion. Neck supple.  Full ROM without pain  Cardiovascular: Normal rate, regular rhythm and intact distal pulses.   Murmur heard. Cap refill <3 Intact distal pulses  Pulmonary/Chest: Effort normal and breath sounds normal. No respiratory distress. She has no wheezes.  Abdominal: Soft. She exhibits no distension. There is no tenderness.  Musculoskeletal:  Full range of motion of the T-spine and L-spine No tenderness to palpation of the spinous processes of the T-spine or L-spine Mild tenderness to palpation of the paraspinous muscles of the L-spine. No midline tenderness. Pain to palpation of right SI joint.  Lymphadenopathy:    She has no cervical adenopathy.  Neurological: She is alert and oriented to person, place, and time. She has normal strength and normal reflexes. GCS eye subscore is 4. GCS verbal subscore is 5. GCS motor subscore is 6.  Reflex Scores:      Patellar reflexes are 2+ on the right side and 2+ on the left side.      Achilles reflexes are 2+ on the right side and 2+ on the left side. 5/5 strength in left lower extremity Positive straight leg raise on the right Speech is clear and goal oriented, follows commands Normal strength in upper and lower extremities bilaterally including dorsiflexion  and plantar flexion, strong and equal grip strength Sensation normal to light and sharp touch Moves extremities without ataxia, coordination intact Shuffling gait Normal balance  Skin: Skin is warm and dry. No rash noted. She is not diaphoretic. No erythema.    ED Course  Procedures (including critical care time)  DIAGNOSTIC STUDIES: Oxygen Saturation is 100% on room air, normal by my interpretation.    COORDINATION OF CARE: 5:58 PM-Discussed treatment plan which includes spinal x-ray to rule out compression fracture with pt at bedside and pt agreed to plan. Pt declines pain medication when offered.     Labs Reviewed - No data to display Mr Lumbar Spine Wo Contrast  08/31/2012   *RADIOLOGY REPORT*  Clinical Data: Worsening back pain with right hip pain  MRI LUMBAR SPINE WITHOUT CONTRAST  Technique:  Multiplanar and multiecho pulse sequences of the lumbar spine were obtained without intravenous contrast.  Comparison: Lumbar MRI 12/16/2011  Findings: Negative for lumbar spine fracture.  There is edema in the right sacrum medial to the SI joint.  This is most likely an insufficiency fracture on the right.  No fracture of the left sacrum.  Conus medullaris is normal and terminates at L1.  L1-2:  Negative  L2-3:  Disc degeneration and spondylosis.  Facet hypertrophy and mild spinal stenosis  L3-4:  Disc degeneration with disc bulging and spondylosis. Bilateral facet hypertrophy.  Mild spinal stenosis  L4-5:  Grade 1 anterior slip.  There is advanced disc and facet  degeneration.  Mild spinal stenosis and mild foraminal narrowing bilaterally.  L5-S1:  Disc degeneration and spondylosis.  Bilateral facet hypertrophy.  Marked foraminal encroachment on the right with impingement of the right L5 nerve root.  There is also moderate left foraminal encroachment.  IMPRESSION: Asymmetric edema on the right sacrum most consistent with unilateral sacral insufficiency fracture.  Lumbar degenerative changes as  described above.  There is impingement of the L5 nerve root in the foramen bilaterally, right greater than left.   Original Report Authenticated By: Janeece Riggers, M.D.     1. Back pain, lumbosacral   2. Sacral insufficiency fracture, initial encounter       MDM  [name] presents with worsening back pain and gait disturbance. Pt sees Dr Danielle Dess with neurosurgery and has an MRI scheduled for Sunday afternoon.  Will order pain control and MRI of the L spine.  Dr. Ignacia Palma was consulted, evaluated this patient with me and agrees with the plan.    Pain controlled with percocet; no mental status changes.    MRI with asymmetric edema on the right sacrum most consistent with unilateral sacral insufficiency fracture and Lumbar degenerative changes with an impingement of the L5 nerve root in the foramen bilaterally, right greater than left.  I personally reviewed the imaging tests through PACS system.  I reviewed available ER/hospitalization records through the EMR.  We'll discharge home with pain control. Discussed at length my concern that Percocet may cause her to be unsteady on her feet and will increase her fall risk. Patient's daughter-in-law states she will go home with them they will be with her through the night. Patient is to call Dr. Danielle Dess tomorrow to discuss the results of the MRI and further treatment.  I have also discussed reasons to return immediately to the ER.  Patient expresses understanding and agrees with plan.  I personally performed the services described in this documentation, which was scribed in my presence. The recorded information has been reviewed and is accurate.       Dahlia Client Oliver Heitzenrater, PA-C 08/31/12 2137

## 2012-08-31 NOTE — ED Notes (Signed)
Pt in c/o chronic back pain, states she is being seen by her PCP for this back pain and is being managed with pain medication and steroid shots, states over the last few days her pain has increased and is causing her to have difficulty walking due to the pain, her PCP is out of town and when she called the MD office she was told the only option she had was to come here to be seen. Pt has scheduled MRI on Sunday afternoon in reference to this pain.

## 2012-08-31 NOTE — ED Notes (Signed)
Pt taken to mri

## 2012-08-31 NOTE — ED Notes (Signed)
Patient transported to X-ray 

## 2012-09-01 NOTE — ED Provider Notes (Signed)
Medical screening examination/treatment/procedure(s) were conducted as a shared visit with non-physician practitioner(s) and myself.  I personally evaluated the patient during the encounter Elderly lady with severe low back pain, has had epidural injections but pain persists.  Was sheduled to have MRI of lumbar spine next week, but worsening pain brought her in today.  Advised obtaining MRI today.  Carleene Cooper III, MD 09/01/12 [phone]

## 2012-09-03 ENCOUNTER — Other Ambulatory Visit: Payer: Medicare Other

## 2012-09-06 ENCOUNTER — Other Ambulatory Visit: Payer: Self-pay | Admitting: Neurological Surgery

## 2012-09-08 NOTE — Progress Notes (Addendum)
Message left on pt voicemail [phone] which included pre-op instructions and to Stop taking Aspirin and herbal medications. Do not take any NSAIDs ie: Ibuprofen, Advil, Naproxen or any medication containing Aspirin (Mobic).

## 2012-09-10 MED ORDER — CEFAZOLIN SODIUM-DEXTROSE 2-3 GM-% IV SOLR
2.0000 g | INTRAVENOUS | Status: DC
  Filled 2012-09-10: qty 50

## 2012-09-11 ENCOUNTER — Encounter (HOSPITAL_COMMUNITY): Payer: Self-pay | Admitting: Certified Registered"

## 2012-09-11 ENCOUNTER — Encounter (HOSPITAL_COMMUNITY)
Admission: RE | Disposition: A | Discharge status: Skilled Nursing Facility | Payer: Self-pay | Source: Ambulatory Visit | Attending: Neurological Surgery

## 2012-09-11 ENCOUNTER — Ambulatory Visit (HOSPITAL_COMMUNITY): Payer: Medicare Other | Admitting: Certified Registered"

## 2012-09-11 ENCOUNTER — Inpatient Hospital Stay (HOSPITAL_COMMUNITY)
Admission: RE | Admit: 2012-09-11 | Discharge: 2012-09-15 | DRG: 516 | Disposition: A | Discharge status: Skilled Nursing Facility | Payer: Medicare Other | Source: Ambulatory Visit | Attending: Neurological Surgery | Admitting: Neurological Surgery

## 2012-09-11 ENCOUNTER — Ambulatory Visit (HOSPITAL_COMMUNITY): Payer: Medicare Other

## 2012-09-11 ENCOUNTER — Encounter (HOSPITAL_COMMUNITY): Payer: Self-pay | Admitting: *Deleted

## 2012-09-11 DIAGNOSIS — Z7982 Long term (current) use of aspirin: Secondary | ICD-10-CM

## 2012-09-11 DIAGNOSIS — M5137 Other intervertebral disc degeneration, lumbosacral region: Secondary | ICD-10-CM | POA: Diagnosis present

## 2012-09-11 DIAGNOSIS — R5381 Other malaise: Secondary | ICD-10-CM | POA: Diagnosis present

## 2012-09-11 DIAGNOSIS — M1710 Unilateral primary osteoarthritis, unspecified knee: Secondary | ICD-10-CM | POA: Diagnosis present

## 2012-09-11 DIAGNOSIS — E7800 Pure hypercholesterolemia, unspecified: Secondary | ICD-10-CM | POA: Diagnosis present

## 2012-09-11 DIAGNOSIS — I350 Nonrheumatic aortic (valve) stenosis: Secondary | ICD-10-CM

## 2012-09-11 DIAGNOSIS — I10 Essential (primary) hypertension: Secondary | ICD-10-CM | POA: Diagnosis present

## 2012-09-11 DIAGNOSIS — Z79899 Other long term (current) drug therapy: Secondary | ICD-10-CM

## 2012-09-11 DIAGNOSIS — Z8673 Personal history of transient ischemic attack (TIA), and cerebral infarction without residual deficits: Secondary | ICD-10-CM

## 2012-09-11 DIAGNOSIS — M8448XA Pathological fracture, other site, initial encounter for fracture: Principal | ICD-10-CM | POA: Diagnosis present

## 2012-09-11 DIAGNOSIS — M51379 Other intervertebral disc degeneration, lumbosacral region without mention of lumbar back pain or lower extremity pain: Secondary | ICD-10-CM | POA: Diagnosis present

## 2012-09-11 DIAGNOSIS — M810 Age-related osteoporosis without current pathological fracture: Secondary | ICD-10-CM | POA: Diagnosis present

## 2012-09-11 DIAGNOSIS — I359 Nonrheumatic aortic valve disorder, unspecified: Secondary | ICD-10-CM | POA: Diagnosis present

## 2012-09-11 DIAGNOSIS — I5032 Chronic diastolic (congestive) heart failure: Secondary | ICD-10-CM | POA: Diagnosis present

## 2012-09-11 DIAGNOSIS — G4700 Insomnia, unspecified: Secondary | ICD-10-CM | POA: Diagnosis present

## 2012-09-11 HISTORY — PX: KYPHOPLASTY: SHX5884

## 2012-09-11 LAB — CBC
HCT: 39.8 % (ref 36.0–46.0)
HCT: 38.4 % (ref 36.0–46.0)
Hemoglobin: 13.8 g/dL (ref 12.0–15.0)
Hemoglobin: 13.6 g/dL (ref 12.0–15.0)
MCH: 30.1 pg (ref 26.0–34.0)
MCH: 30.4 pg (ref 26.0–34.0)
MCHC: 34.7 g/dL (ref 30.0–36.0)
MCHC: 35.4 g/dL (ref 30.0–36.0)
MCV: 86.9 fL (ref 78.0–100.0)
MCV: 85.9 fL (ref 78.0–100.0)
Platelets: 231 10*3/uL (ref 150–400)
Platelets: 211 10*3/uL (ref 150–400)
RBC: 4.58 MIL/uL (ref 3.87–5.11)
RBC: 4.47 MIL/uL (ref 3.87–5.11)
RDW: 12.7 % (ref 11.5–15.5)
RDW: 12.7 % (ref 11.5–15.5)
WBC: 5.8 10*3/uL (ref 4.0–10.5)
WBC: 9.3 10*3/uL (ref 4.0–10.5)

## 2012-09-11 LAB — BASIC METABOLIC PANEL
BUN: 24 mg/dL — ABNORMAL HIGH (ref 6–23)
CO2: 27 mEq/L (ref 19–32)
Calcium: 9 mg/dL (ref 8.4–10.5)
Chloride: 98 mEq/L (ref 96–112)
Creatinine, Ser: 1.02 mg/dL (ref 0.50–1.10)
GFR calc Af Amer: 56 mL/min — ABNORMAL LOW (ref >90)
GFR calc non Af Amer: 49 mL/min — ABNORMAL LOW (ref >90)
Glucose, Bld: 104 mg/dL — ABNORMAL HIGH (ref 70–99)
Potassium: 4.8 mEq/L (ref 3.5–5.1)
Sodium: 134 mEq/L — ABNORMAL LOW (ref 135–145)

## 2012-09-11 LAB — SURGICAL PCR SCREEN
MRSA, PCR: NEGATIVE
Staphylococcus aureus: NEGATIVE

## 2012-09-11 LAB — CREATININE, SERUM
Creatinine, Ser: 0.87 mg/dL (ref 0.50–1.10)
GFR calc Af Amer: 68 mL/min — ABNORMAL LOW (ref >90)
GFR calc non Af Amer: 59 mL/min — ABNORMAL LOW (ref >90)

## 2012-09-11 SURGERY — KYPHOPLASTY
Anesthesia: General | Site: Back | Laterality: Right | Wound class: Clean

## 2012-09-11 MED ORDER — ASPIRIN 81 MG PO CHEW
81.0000 mg | CHEWABLE_TABLET | Freq: Every day | ORAL | Status: DC
  Administered 2012-09-12 – 2012-09-15 (×4): 81 mg via ORAL
  Filled 2012-09-11 (×4): qty 1

## 2012-09-11 MED ORDER — FUROSEMIDE 20 MG PO TABS
20.0000 mg | ORAL_TABLET | Freq: Every day | ORAL | Status: DC | PRN
  Filled 2012-09-11: qty 1

## 2012-09-11 MED ORDER — PROPOFOL 10 MG/ML IV BOLUS
INTRAVENOUS | Status: DC | PRN
  Administered 2012-09-11: 30 mg via INTRAVENOUS
  Administered 2012-09-11: 110 mg via INTRAVENOUS

## 2012-09-11 MED ORDER — MENTHOL 3 MG MT LOZG: 1.0000 | LOZENGE | OROMUCOSAL | Status: DC | PRN

## 2012-09-11 MED ORDER — FENTANYL CITRATE 0.05 MG/ML IJ SOLN
25.0000 ug | INTRAMUSCULAR | Status: DC | PRN
  Administered 2012-09-11: 50 ug via INTRAVENOUS
  Administered 2012-09-11 (×2): 25 ug via INTRAVENOUS

## 2012-09-11 MED ORDER — 0.9 % SODIUM CHLORIDE (POUR BTL) OPTIME
TOPICAL | Status: DC | PRN
  Administered 2012-09-11: 1000 mL

## 2012-09-11 MED ORDER — SUCCINYLCHOLINE CHLORIDE 20 MG/ML IJ SOLN
INTRAMUSCULAR | Status: DC | PRN
  Administered 2012-09-11: 80 mg via INTRAVENOUS

## 2012-09-11 MED ORDER — SODIUM CHLORIDE 0.9 % IJ SOLN
3.0000 mL | Freq: Two times a day (BID) | INTRAMUSCULAR | Status: DC
  Administered 2012-09-11 – 2012-09-15 (×6): 3 mL via INTRAVENOUS

## 2012-09-11 MED ORDER — GLYCOPYRROLATE 0.2 MG/ML IJ SOLN
INTRAMUSCULAR | Status: DC | PRN
  Administered 2012-09-11: 0.2 mg via INTRAVENOUS

## 2012-09-11 MED ORDER — SODIUM CHLORIDE 0.9 % IV SOLN
250.0000 mL | INTRAVENOUS | Status: DC
  Administered 2012-09-11 – 2012-09-12 (×2): 250 mL via INTRAVENOUS

## 2012-09-11 MED ORDER — PHENOL 1.4 % MT LIQD
1.0000 | OROMUCOSAL | Status: DC | PRN
  Administered 2012-09-12: 1 via OROMUCOSAL
  Filled 2012-09-11 (×2): qty 177

## 2012-09-11 MED ORDER — VANCOMYCIN HCL IN DEXTROSE 1-5 GM/200ML-% IV SOLN
INTRAVENOUS | Status: AC
  Filled 2012-09-11: qty 200

## 2012-09-11 MED ORDER — MELOXICAM 7.5 MG PO TABS
7.5000 mg | ORAL_TABLET | Freq: Every day | ORAL | Status: DC
  Administered 2012-09-11 – 2012-09-15 (×5): 7.5 mg via ORAL
  Filled 2012-09-11 (×5): qty 1

## 2012-09-11 MED ORDER — SUFENTANIL CITRATE 50 MCG/ML IV SOLN
INTRAVENOUS | Status: DC | PRN
  Administered 2012-09-11: 10 ug via INTRAVENOUS

## 2012-09-11 MED ORDER — TRAMADOL HCL 50 MG PO TABS
50.0000 mg | ORAL_TABLET | Freq: Four times a day (QID) | ORAL | Status: DC | PRN
  Administered 2012-09-12 – 2012-09-15 (×9): 50 mg via ORAL
  Filled 2012-09-11 (×9): qty 1

## 2012-09-11 MED ORDER — MUPIROCIN 2 % EX OINT
TOPICAL_OINTMENT | CUTANEOUS | Status: AC
  Administered 2012-09-11: 1 via NASAL
  Filled 2012-09-11: qty 22

## 2012-09-11 MED ORDER — ENOXAPARIN SODIUM 30 MG/0.3ML ~~LOC~~ SOLN
30.0000 mg | SUBCUTANEOUS | Status: DC
  Administered 2012-09-12 – 2012-09-15 (×4): 30 mg via SUBCUTANEOUS
  Filled 2012-09-11 (×5): qty 0.3

## 2012-09-11 MED ORDER — ONDANSETRON HCL 4 MG/2ML IJ SOLN
INTRAMUSCULAR | Status: AC
  Administered 2012-09-11: 4 mg
  Filled 2012-09-11: qty 2

## 2012-09-11 MED ORDER — POLYETHYLENE GLYCOL 3350 17 G PO PACK
17.0000 g | PACK | ORAL | Status: DC | PRN
  Administered 2012-09-12 – 2012-09-14 (×2): 17 g via ORAL
  Filled 2012-09-11 (×4): qty 1

## 2012-09-11 MED ORDER — BUPIVACAINE HCL (PF) 0.25 % IJ SOLN
INTRAMUSCULAR | Status: DC | PRN
  Administered 2012-09-11: 2 mL

## 2012-09-11 MED ORDER — LACTATED RINGERS IV SOLN
INTRAVENOUS | Status: DC | PRN
  Administered 2012-09-11: 16:00:00 via INTRAVENOUS

## 2012-09-11 MED ORDER — OXYCODONE-ACETAMINOPHEN 5-325 MG PO TABS
1.0000 | ORAL_TABLET | Freq: Three times a day (TID) | ORAL | Status: DC | PRN
  Administered 2012-09-11 – 2012-09-15 (×11): 1 via ORAL
  Filled 2012-09-11 (×12): qty 1

## 2012-09-11 MED ORDER — ONDANSETRON HCL 4 MG/2ML IJ SOLN
4.0000 mg | Freq: Once | INTRAMUSCULAR | Status: AC
  Administered 2012-09-11: 4 mg via INTRAVENOUS

## 2012-09-11 MED ORDER — FENTANYL CITRATE 0.05 MG/ML IJ SOLN
INTRAMUSCULAR | Status: AC
  Filled 2012-09-11: qty 2

## 2012-09-11 MED ORDER — LIDOCAINE-EPINEPHRINE 1 %-1:100000 IJ SOLN
INTRAMUSCULAR | Status: DC | PRN
  Administered 2012-09-11: 2 mL

## 2012-09-11 MED ORDER — VANCOMYCIN HCL IN DEXTROSE 1-5 GM/200ML-% IV SOLN
1000.0000 mg | Freq: Once | INTRAVENOUS | Status: AC
  Administered 2012-09-11: 1000 mg via INTRAVENOUS

## 2012-09-11 MED ORDER — MUPIROCIN 2 % EX OINT
TOPICAL_OINTMENT | Freq: Two times a day (BID) | CUTANEOUS | Status: DC
  Administered 2012-09-11 – 2012-09-12 (×2): via NASAL
  Filled 2012-09-11 (×2): qty 22

## 2012-09-11 MED ORDER — ACETAMINOPHEN 650 MG RE SUPP: 650.0000 mg | RECTAL | Status: DC | PRN

## 2012-09-11 MED ORDER — ACETAMINOPHEN 325 MG PO TABS: 650.0000 mg | ORAL_TABLET | ORAL | Status: DC | PRN

## 2012-09-11 MED ORDER — SODIUM CHLORIDE 0.9 % IJ SOLN: 3.0000 mL | INTRAMUSCULAR | Status: DC | PRN

## 2012-09-11 MED ORDER — METOPROLOL SUCCINATE ER 25 MG PO TB24
25.0000 mg | ORAL_TABLET | Freq: Every day | ORAL | Status: DC
  Administered 2012-09-11 – 2012-09-15 (×4): 25 mg via ORAL
  Filled 2012-09-11 (×5): qty 1

## 2012-09-11 MED ORDER — LOSARTAN POTASSIUM 50 MG PO TABS
100.0000 mg | ORAL_TABLET | Freq: Every day | ORAL | Status: DC
  Administered 2012-09-12 – 2012-09-15 (×4): 100 mg via ORAL
  Filled 2012-09-11 (×4): qty 2

## 2012-09-11 MED ORDER — ONDANSETRON HCL 4 MG/2ML IJ SOLN
4.0000 mg | INTRAMUSCULAR | Status: DC | PRN
  Administered 2012-09-11 – 2012-09-15 (×3): 4 mg via INTRAVENOUS
  Filled 2012-09-11 (×3): qty 2

## 2012-09-11 MED ORDER — LOSARTAN POTASSIUM 50 MG PO TABS: 100.0000 mg | ORAL_TABLET | Freq: Every day | ORAL | Status: DC

## 2012-09-11 MED ORDER — ATORVASTATIN CALCIUM 20 MG PO TABS
20.0000 mg | ORAL_TABLET | Freq: Every day | ORAL | Status: DC
  Administered 2012-09-11 – 2012-09-15 (×5): 20 mg via ORAL
  Filled 2012-09-11 (×5): qty 1

## 2012-09-11 MED ORDER — LIDOCAINE HCL (CARDIAC) 20 MG/ML IV SOLN
INTRAVENOUS | Status: DC | PRN
  Administered 2012-09-11: 50 mg via INTRAVENOUS

## 2012-09-11 MED ORDER — CEFAZOLIN SODIUM 1-5 GM-% IV SOLN: 1.0000 g | Freq: Three times a day (TID) | INTRAVENOUS | Status: DC

## 2012-09-11 MED ORDER — ASPIRIN 81 MG PO TABS: 81.0000 mg | ORAL_TABLET | Freq: Every day | ORAL | Status: DC

## 2012-09-11 MED ORDER — TEMAZEPAM 7.5 MG PO CAPS: 15.0000 mg | ORAL_CAPSULE | Freq: Every evening | ORAL | Status: DC | PRN

## 2012-09-11 SURGICAL SUPPLY — 48 items
ADH SKN CLS APL DERMABOND .7 (GAUZE/BANDAGES/DRESSINGS) ×1
BANDAGE ADHESIVE 1X3 (GAUZE/BANDAGES/DRESSINGS) ×8 IMPLANT
BLADE SURG 11 STRL SS (BLADE) ×2 IMPLANT
BLADE SURG ROTATE 9660 (MISCELLANEOUS) IMPLANT
CEMENT BONE KYPHX HV R (Orthopedic Implant) ×1 IMPLANT
CEMENT KYPHON C01A KIT/MIXER (Cement) ×1 IMPLANT
CLOTH BEACON ORANGE TIMEOUT ST (SAFETY) ×2 IMPLANT
CONT SPEC 4OZ CLIKSEAL STRL BL (MISCELLANEOUS) ×4 IMPLANT
DECANTER SPIKE VIAL GLASS SM (MISCELLANEOUS) ×2 IMPLANT
DERMABOND ADVANCED (GAUZE/BANDAGES/DRESSINGS) ×1
DERMABOND ADVANCED .7 DNX12 (GAUZE/BANDAGES/DRESSINGS) IMPLANT
DRAPE C-ARM 42X72 X-RAY (DRAPES) ×2 IMPLANT
DRAPE INCISE IOBAN 66X45 STRL (DRAPES) ×2 IMPLANT
DRAPE LAPAROTOMY 100X72X124 (DRAPES) ×2 IMPLANT
DRAPE PROXIMA HALF (DRAPES) ×2 IMPLANT
DURAPREP 26ML APPLICATOR (WOUND CARE) ×2 IMPLANT
GAUZE SPONGE 4X4 16PLY XRAY LF (GAUZE/BANDAGES/DRESSINGS) ×2 IMPLANT
GLOVE BIO SURGEON STRL SZ7.5 (GLOVE) IMPLANT
GLOVE BIOGEL PI IND STRL 6.5 (GLOVE) IMPLANT
GLOVE BIOGEL PI IND STRL 7.5 (GLOVE) IMPLANT
GLOVE BIOGEL PI IND STRL 8.5 (GLOVE) ×1 IMPLANT
GLOVE BIOGEL PI INDICATOR 6.5 (GLOVE) ×1
GLOVE BIOGEL PI INDICATOR 7.5 (GLOVE)
GLOVE BIOGEL PI INDICATOR 8.5 (GLOVE) ×1
GLOVE ECLIPSE 8.5 STRL (GLOVE) ×2 IMPLANT
GLOVE EXAM NITRILE LRG STRL (GLOVE) IMPLANT
GLOVE EXAM NITRILE MD LF STRL (GLOVE) IMPLANT
GLOVE EXAM NITRILE XL STR (GLOVE) IMPLANT
GLOVE EXAM NITRILE XS STR PU (GLOVE) IMPLANT
GLOVE SURG SS PI 6.5 STRL IVOR (GLOVE) ×1 IMPLANT
GOWN BRE IMP SLV AUR LG STRL (GOWN DISPOSABLE) IMPLANT
GOWN BRE IMP SLV AUR XL STRL (GOWN DISPOSABLE) ×2 IMPLANT
GOWN STRL REIN 2XL LVL4 (GOWN DISPOSABLE) ×2 IMPLANT
KIT BASIN OR (CUSTOM PROCEDURE TRAY) ×2 IMPLANT
KIT ROOM TURNOVER OR (KITS) ×2 IMPLANT
NDL HYPO 25X1 1.5 SAFETY (NEEDLE) ×1 IMPLANT
NEEDLE HYPO 25X1 1.5 SAFETY (NEEDLE) ×2 IMPLANT
NS IRRIG 1000ML POUR BTL (IV SOLUTION) ×2 IMPLANT
PACK SURGICAL SETUP 50X90 (CUSTOM PROCEDURE TRAY) ×2 IMPLANT
PAD ARMBOARD 7.5X6 YLW CONV (MISCELLANEOUS) ×6 IMPLANT
SPECIMEN JAR SMALL (MISCELLANEOUS) IMPLANT
SUT VIC AB 3-0 SH 8-18 (SUTURE) ×2 IMPLANT
SUT VIC AB 4-0 P-3 18X BRD (SUTURE) ×1 IMPLANT
SUT VIC AB 4-0 P3 18 (SUTURE) ×2
SYR CONTROL 10ML LL (SYRINGE) ×4 IMPLANT
TOWEL OR 17X24 6PK STRL BLUE (TOWEL DISPOSABLE) ×2 IMPLANT
TOWEL OR 17X26 10 PK STRL BLUE (TOWEL DISPOSABLE) ×2 IMPLANT
TRAY KYPHOPAK 15/3 ONESTEP 1ST (MISCELLANEOUS) ×1 IMPLANT

## 2012-09-11 NOTE — Progress Notes (Signed)
Dr Gelene Mink aware pt is nauseated.  Order received to give zofran 4 mg and may repeat x 1.

## 2012-09-11 NOTE — Anesthesia Preprocedure Evaluation (Signed)
Anesthesia Evaluation  Patient identified by MRN, date of birth, ID band Patient awake    Reviewed: Allergy & Precautions, H&P , NPO status , Patient's Chart, lab work & pertinent test results, reviewed documented beta blocker date and time   Airway Mallampati: II TM Distance: >3 FB Neck ROM: Full    Dental no notable dental hx. (+) Teeth Intact and Dental Advisory Given   Pulmonary neg pulmonary ROS,  breath sounds clear to auscultation  Pulmonary exam normal       Cardiovascular hypertension, On Medications and On Home Beta Blockers + dysrhythmias + Valvular Problems/Murmurs AS and AI Rhythm:Regular Rate:Normal     Neuro/Psych TIA Neuromuscular disease negative psych ROS   GI/Hepatic negative GI ROS, Neg liver ROS,   Endo/Other  negative endocrine ROS  Renal/GU negative Renal ROS  negative genitourinary   Musculoskeletal   Abdominal   Peds  Hematology negative hematology ROS (+) anemia ,   Anesthesia Other Findings   Reproductive/Obstetrics negative OB ROS                           Anesthesia Physical Anesthesia Plan  ASA: III  Anesthesia Plan: General   Post-op Pain Management:    Induction: Intravenous  Airway Management Planned: Oral ETT  Additional Equipment:   Intra-op Plan:   Post-operative Plan: Extubation in OR  Informed Consent: I have reviewed the patients History and Physical, chart, labs and discussed the procedure including the risks, benefits and alternatives for the proposed anesthesia with the patient or authorized representative who has indicated his/her understanding and acceptance.   Dental advisory given  Plan Discussed with: CRNA  Anesthesia Plan Comments:         Anesthesia Quick Evaluation

## 2012-09-11 NOTE — Transfer of Care (Signed)
Immediate Anesthesia Transfer of Care Note  Patient: Glenda [name]  Procedure(s) Performed: Procedure(s) with comments: Right Acrylic Sacroplasty (Right) - Right  Acrylic Sacroplasty  Patient Location: PACU  Anesthesia Type:General  Level of Consciousness: alert   Airway & Oxygen Therapy: Patient Spontanous Breathing and Patient connected to nasal cannula oxygen  Post-op Assessment: Report given to PACU RN, Post -op Vital signs reviewed and stable and Patient moving all extremities  Post vital signs: Reviewed and stable  Complications: No apparent anesthesia complications

## 2012-09-11 NOTE — Anesthesia Postprocedure Evaluation (Signed)
Anesthesia Post Note  Patient: Glenda [name]  Procedure(s) Performed: Procedure(s) (LRB): Right Acrylic Sacroplasty (Right)  Anesthesia type: General  Patient location: PACU  Post pain: Pain level controlled  Post assessment: Patient's Cardiovascular Status Stable  Last Vitals:  Filed Vitals:   09/11/12 2000  BP:   Pulse:   Temp: 36.9 C  Resp:     Post vital signs: Reviewed and stable  Level of consciousness: alert  Complications: No apparent anesthesia complications

## 2012-09-11 NOTE — H&P (Signed)
Glenda [name] is an 79 y.o. female.   Chief Complaint: Severe back pain, buttock pain HPI:   Glenda [name] was seen in the office lastly. She has been having a time with her back, with severe, unrelenting back pain for a little over a weeks' time. Last Thursday, she was seen in the emergency department and an MRI of the lumbar spine demonstrated the presence of a right sacral insufficiency fracture. Glenda [name] have some spondylitic disease in the lower lumbar spine, for which I have treated her with some intermittent steroid injections; however, now the pain has been severe and unremitting. She was given some Percocet in the emergency room for a total of six tablets, and she has gone through these. Hydrocodone [name] not seem to be helping much. Her son notes that she has been taking a combination of the hydrocodone and tramadol, but her pain has been persistent and unrelenting. Glenda [name] is truly uncomfortable, fidgeting about and very tremulous with pain, in the chair. She is capable of moving her legs fairly well, but she notes that she cannot walk, for bearing weight tends to exacerbate the pain substantially. IMPRESSION/PLAN: I reviewed the MRI with the patient and her son and explained the nature of the sacral insufficiency fracture. I believe that Glenda [name] would be a good candidate for a sacroplasty in an effort to try to control the pain. We discussed the use of pain medication, and I will give Glenda [name] a prescription for some Percocet 5/325. [name], as she tends to get nauseated on the Percocet. I did caution about the need to use stool softeners and laxatives to make sure that her bowels continue to move. She is scheduled for acrylic sacroplasty today.  Past Medical History  Diagnosis Date  . Hypertension   . Peripheral edema   . PVCs (premature ventricular contractions)   . TIA (transient ischemic attack)   . Hiatal hernia   . Diverticulosis   . OA (osteoarthritis)  of knee   . Hypercholesterolemia   . Insomnia   . DDD (degenerative disc disease), lumbar     severe facet dz and adv DDD MRI L spine 2009  . Aortic stenosis     Echo 02/2011 showing mild AS with normal LV systolic function    Past Surgical History  Procedure Laterality Date  . Cardiac catheterization  02/17/2001    MILD REGURGITATION. EF 60%  . Colonoscopy    . Cesarean section    . Hemorroidectomy    . Breast biopsy      LEFT BREAST    Family History  Problem Relation Age of Onset  . Heart disease Mother   . Hypertension Father   . Heart disease Sister    Social History:  reports that she has never smoked. She [name] not have any smokeless tobacco history on file. She reports that she [name] not drink alcohol or use illicit drugs.  Allergies:  Allergies  Allergen Reactions  . Amitriptyline     hyper  . Avelox (Moxifloxacin Hcl In Nacl) Other (See Comments)    dizziness  . Halcion (Triazolam)   . Pentazocine Lactate   . Sulfa Drugs Cross Reactors   . Trazodone And Nefazodone     Not effective    No prescriptions prior to admission    No results found for this or any previous visit (from the past 48 hour(s)). No results found.  Review of Systems  HENT: Negative.   Eyes:  Negative.   Respiratory: Negative.   Cardiovascular: Negative.   Gastrointestinal: Negative.   Genitourinary: Negative.   Musculoskeletal: Positive for back pain.  Skin: Negative.   Neurological: Positive for weakness.       Diffuse weakness in lower extremities secondary to extensive pain at lumbosacral junction  Psychiatric/Behavioral: Negative.     There were no vitals taken for this visit. Physical Exam  Constitutional: She is oriented to person, place, and time. She appears well-developed.  Frail appearing  HENT:  Head: Normocephalic and atraumatic.  Eyes: Conjunctivae are normal. Pupils are equal, round, and reactive to light.  Neck: Normal range of motion. Neck supple.   Cardiovascular: Normal rate and regular rhythm.   Respiratory: Breath sounds normal.  GI: Soft. Bowel sounds are normal.  Musculoskeletal:  Pain across the lumbosacral junction  Neurological: She is alert and oriented to person, place, and time.  Skin: Skin is warm and dry.  Psychiatric: She has a normal mood and affect. Her behavior is normal. Judgment and thought content normal.     Assessment/Plan Right sacral insufficiency fracture with severe and unrelenting pain. Patient is admitted for acrylic sacral plasty  Glenda [name] J 09/11/2012, 10:41 AM

## 2012-09-11 NOTE — Op Note (Signed)
Date of surgery: 09/11/2012 Preoperative diagnosis: Right sacral insufficiency fracture with severe back pain Postoperative diagnosis: Right sacral insufficiency fracture with severe back pain Procedure: Acrylic sacral plasty with fluoroscopic guidance Surgeon: Barnett Abu Anesthesia: Gen. endotracheal Indications: The patient is an 79 year old individual who has had previous problems with osteoporosis. She had developed a sacral insufficiency fracture and has had severe back and gluteal pain and low back. She was advised regarding a sacral plasty.  Procedure: The patient was brought to the operating room supine on a stretcher. After the smooth induction of general endotracheal anesthesia: She was turned prone the back was prepped with alcohol and DuraPrep and draped in a sterile fashion. By using fluoroscopic guidance we isolated the right sacral ala. The skin was then incised with a #11 blade and a Jamshidi needle was placed to the sacral ala just below the facet of the sacrum which is part of the L5-S1 complex. The needle was easily inserted into the ala. Cement was then mixed and allowed to harden to appropriate consistency. Then under lateral fluoroscopic guidance I injected a total of 4 cc of cement stopping to check intermittent AP views area most of the cement went to the medial aspect of the sacral vertebrae. No extravasation was noted. Once good filling was obtained at the level of 4 cc we stopped the procedure. A Jamshidi trocar was removed. A singular 30 inverted Vicryl suture was used to close the skin. A dry sterile dressing was applied.

## 2012-09-11 NOTE — Preoperative (Signed)
Beta Blockers   Reason not to administer Beta Blockers:Metoprolol taken by patient at 2000 hrs, 09/10/12

## 2012-09-11 NOTE — Progress Notes (Signed)
Dr. Gelene Mink notified about SBP still in the 200's, no new order given. Patient claims pain is much better. Was very anxious and shaking earlier,warmed up with warm  blankets .

## 2012-09-11 NOTE — Progress Notes (Signed)
Patient ID: Glenda [name], female   DOB: [birth year], 79 y.o.   MRN: [id number] Alert, BP up. Feels fair. Family concerned about her inability to stay by herself, may need placement. PT OT Social Services to see in am.

## 2012-09-12 MED ORDER — BOOST / RESOURCE BREEZE PO LIQD
1.0000 | Freq: Two times a day (BID) | ORAL | Status: DC
  Administered 2012-09-12 – 2012-09-15 (×6): 1 via ORAL

## 2012-09-12 NOTE — Evaluation (Signed)
Physical Therapy Evaluation Patient Details Name: Glenda [name] MRN: [id number] DOB: Jan 12, 1925 Today's Date: 09/12/2012 Time: 1415-1450 PT Time Calculation (min): 35 min  PT Assessment / Plan / Recommendation Clinical Impression  79 y/o female s/p sacroplasty (due to sacral insufficiency fracture) with hopes of pain relief. Presents to PT today with   pain (relieved only by lying flat) as well as the below impairments impacting her functional independence and mobililty. Will require physical therapy in the acute setting to maximize strength and mobility so as to facilitate safe d/c plan. Patient currently lives alone and at this time will be unable to return to this independent level. Will require rehab stay in prep for transition to assisted living situation (son is working on this).    PT Assessment  Patient needs continued PT services    Follow Up Recommendations  SNF    Does the patient have the potential to tolerate intense rehabilitation      Barriers to Discharge Decreased caregiver support      Equipment Recommendations  None recommended by PT    Recommendations for Other Services     Frequency Min 5X/week    Precautions / Restrictions Precautions Precautions: Fall Restrictions Weight Bearing Restrictions: No   Pertinent Vitals/Pain Reports 8/10 back pain, RN provided pain meds      Mobility  Bed Mobility Bed Mobility: Sit to Supine Sit to Supine: 3: Mod assist Details for Bed Mobility Assistance: Assistance to lift both LE's into bed.  Transfers Sit to Stand: 4: Min assist;With upper extremity assist;From bed Stand to Sit: 4: Min assist;With upper extremity assist;To chair/3-in-1;To bed Details for Transfer Assistance: Cues for hand placement.  Ambulation/Gait Ambulation/Gait Assistance: 4: Min assist Ambulation Distance (Feet): 6 Feet Assistive device: Rolling walker Ambulation/Gait Assistance Details: facilitation for tall posture and stability as well  as assist to maneuver RW Gait Pattern: Decreased weight shift to right;Narrow base of support;Trunk flexed;Decreased stance time - right;Decreased step length - left;Decreased step length - right Gait velocity: decreased Stairs: No         PT Diagnosis: Difficulty walking;Abnormality of gait;Generalized weakness;Acute pain  PT Problem List: Decreased strength;Decreased activity tolerance;Decreased mobility;Pain PT Treatment Interventions: DME instruction;Gait training;Functional mobility training;Therapeutic activities;Therapeutic exercise;Balance training;Patient/family education   PT Goals Acute Rehab PT Goals PT Goal Formulation: With patient Time For Goal Achievement: 09/19/12 Potential to Achieve Goals: Good Pt will go Supine/Side to Sit: with supervision PT Goal: Supine/Side to Sit - Progress: Goal set today Pt will go Sit to Supine/Side: with supervision PT Goal: Sit to Supine/Side - Progress: Goal set today Pt will go Sit to Stand: with supervision PT Goal: Sit to Stand - Progress: Goal set today Pt will go Stand to Sit: with supervision PT Goal: Stand to Sit - Progress: Goal set today Pt will Ambulate: 16 - 50 feet;with supervision;with least restrictive assistive device PT Goal: Ambulate - Progress: Goal set today Pt will Perform Home Exercise Program: Independently PT Goal: Perform Home Exercise Program - Progress: Goal set today  Visit Information  Last PT Received On: 09/12/12 Assistance Needed: +1 (+2 for chair follow)    Subjective Data  Subjective: It starts to hurt really bad when I am in one place for too long.  Patient Stated Goal: stronger, no pain   Prior Functioning  Home Living Lives With: Alone Available Help at Discharge: Family Type of Home: House Home Access: Stairs to enter Home Layout: Two level;Bed/bath upstairs Bathroom Shower/Tub: Engineer, manufacturing systems: Standard Prior Function  Level of Independence: Independent (prior to a week  or so due to back pain) Communication Communication: No difficulties    Cognition  Cognition Arousal/Alertness: Awake/alert Behavior During Therapy: WFL for tasks assessed/performed Overall Cognitive Status: Within Functional Limits for tasks assessed    Extremity/Trunk Assessment Right Upper Extremity Assessment RUE ROM/Strength/Tone: [facility] for tasks assessed Left Upper Extremity Assessment LUE ROM/Strength/Tone: WFL for tasks assessed Right Lower Extremity Assessment RLE ROM/Strength/Tone: Deficits RLE ROM/Strength/Tone Deficits: grossly 3+/5; lower extremities shake with fatigue RLE Sensation: WFL - Light Touch Left Lower Extremity Assessment LLE ROM/Strength/Tone: Deficits LLE ROM/Strength/Tone Deficits: grossly 3+/5; lower extremities shake with fatigue  LLE Sensation: WFL - Light Touch   Balance    End of Session PT - End of Session Equipment Utilized During Treatment: Gait belt Activity Tolerance: Patient limited by fatigue;Patient limited by pain Patient left: in bed;with bed alarm set;with family/visitor present Nurse Communication: Mobility status  GP Functional Limitation: Mobility: Walking and moving around Mobility: Walking and Moving Around Current Status (W0981): At least 20 percent but less than 40 percent impaired, limited or restricted Mobility: Walking and Moving Around Goal Status [phone]): At least 1 percent but less than 20 percent impaired, limited or restricted   [facility] [name] 09/12/2012, 3:16 PM

## 2012-09-12 NOTE — Progress Notes (Signed)
UR COMPLETED  

## 2012-09-12 NOTE — Progress Notes (Signed)
INITIAL NUTRITION ASSESSMENT  DOCUMENTATION CODES Per approved criteria  -Not Applicable   INTERVENTION: 1. Resource Breeze po BID, each supplement provides 250 kcal and 9 grams of protein.   NUTRITION DIAGNOSIS: Inadequate oral intake related to increased pain as evidenced by weight loss.   Goal: PO intake to meet >/=90% estimated nutrition needs  Monitor:  PO intake, weight trends, labs, I/O's  Reason for Assessment: Malnutrition Screening Tool  79 y.o. female  Admitting Dx: Severe back pain  ASSESSMENT: Pt admitted with right sacral insuffiencey fracture and pain, now s/p acrylic sacroplasty.  Pt reports she has not been eating well with increase pain and medications. Reweigh pt in bed, 123 lbs. This is a 2-3 lb weight loss in the last 2 weeks per her report.  Son has been trying to get pt to drink Ensure or Boost, but she does not like them. Is willing to try [employer].   Height: Ht Readings from Last 1 Encounters:  09/11/12 5\' 2"  (1.575 m)    Weight: Wt Readings from Last 1 Encounters:  09/11/12 134 lb 14.7 oz (61.2 kg)  123 lbs in bed with 2 pillows and several blankets   Ideal Body Weight: 110 lbs   % Ideal Body Weight: 122%  Wt Readings from Last 10 Encounters:  09/11/12 134 lb 14.7 oz (61.2 kg)  09/11/12 134 lb 14.7 oz (61.2 kg)  08/31/12 124 lb (56.246 kg)  07/11/12 126 lb 6.4 oz (57.335 kg)  04/03/12 125 lb (56.7 kg)  02/29/12 126 lb 1.9 oz (57.208 kg)  01/04/12 123 lb 6.4 oz (55.974 kg)  12/15/11 126 lb 6.4 oz (57.335 kg)  08/19/11 127 lb (57.607 kg)  07/05/11 126 lb 12.8 oz (57.516 kg)    Usual Body Weight: 125 lbs   % Usual Body Weight: 98%  BMI:  22.5 kg/(m^2)   WNL   Estimated Nutritional Needs: Kcal: 1300-1500 Protein: 70-80 gm  Fluid: >/= 1.5 L   Skin: back incision   Diet Order: General  EDUCATION NEEDS: -No education needs identified at this time   Intake/Output Summary (Last 24 hours) at 09/12/12 1127 Last data  filed at 09/11/12 1747  Gross per 24 hour  Intake    500 ml  Output      0 ml  Net    500 ml    Last BM: PTA   Labs:   Recent Labs Lab 09/11/12 1340 09/11/12 2114  NA 134*  --   K 4.8  --   CL 98  --   CO2 27  --   BUN 24*  --   CREATININE 1.02 0.87  CALCIUM 9.0  --   GLUCOSE 104*  --     CBG (last 3)  No results found for this basename: GLUCAP,  in the last 72 hours  Scheduled Meds: . aspirin  81 mg Oral Daily  . atorvastatin  20 mg Oral Daily  . enoxaparin (LOVENOX) injection  30 mg Subcutaneous Q24H  . losartan  100 mg Oral Daily  . meloxicam  7.5 mg Oral Daily  . metoprolol succinate  25 mg Oral Daily  . mupirocin ointment   Nasal BID  . sodium chloride  3 mL Intravenous Q12H    Continuous Infusions: . sodium chloride 250 mL (09/11/12 2057)    Past Medical History  Diagnosis Date  . Hypertension   . Peripheral edema   . PVCs (premature ventricular contractions)   . TIA (transient ischemic attack)   .  Hiatal hernia   . Diverticulosis   . OA (osteoarthritis) of knee   . Hypercholesterolemia   . Insomnia   . DDD (degenerative disc disease), lumbar     severe facet dz and adv DDD MRI L spine 2009  . Aortic stenosis     Echo 02/2011 showing mild AS with normal LV systolic function    Past Surgical History  Procedure Laterality Date  . Cardiac catheterization  02/17/2001    MILD REGURGITATION. EF 60%  . Colonoscopy    . Cesarean section    . Hemorroidectomy    . Breast biopsy      LEFT BREAST    Clarene Duke RD, LDN Pager [phone] After Hours pager [phone]

## 2012-09-12 NOTE — Evaluation (Signed)
Occupational Therapy Evaluation Patient Details Name: Glenda [name] MRN: [id number] DOB: May 09, 1924 Today's Date: 09/12/2012 Time: 0865-7846 OT Time Calculation (min): 23 min  OT Assessment / Plan / Recommendation Clinical Impression    79 y/o female s/p sacroplasty (due to sacral insufficiency fracture) with hopes of pain relief. Presents to OT today with pain (relieved only by lying flat) as well as the below problem list. Will benefit from OT in the acute setting to increase independence and to facilitate safe d/c plan. Patient currently lives alone and at this time will be unable to return to this independent level. Will require rehab stay in prep for transition to assisted living situation (son is working on this).     OT Assessment  Patient needs continued OT Services    Follow Up Recommendations  SNF    Barriers to Discharge      Equipment Recommendations  Other (comment) (tbd)    Recommendations for Other Services    Frequency  Min 2X/week    Precautions / Restrictions Precautions Precautions: Fall Restrictions Weight Bearing Restrictions: No   Pertinent Vitals/Pain Reports 8/10 back pain, RN provided pain meds     ADL  Eating/Feeding: Independent Where Assessed - Eating/Feeding: Chair Grooming: Performed;Brushing hair;Set up;Supervision/safety Where Assessed - Grooming: Unsupported sitting Upper Body Bathing: Set up;Supervision/safety Where Assessed - Upper Body Bathing: Supported sitting Lower Body Bathing: Moderate assistance Where Assessed - Lower Body Bathing: Supported sit to stand Upper Body Dressing: Set up;Supervision/safety Where Assessed - Upper Body Dressing: Supported sitting Lower Body Dressing: Moderate assistance Where Assessed - Lower Body Dressing: Supported sit to Pharmacist, hospital: Mining engineer Method: Sit to Barista: Other (comment) (from bed) Tub/Shower Transfer Method: Not  assessed Equipment Used: Gait belt;Rolling walker Transfers/Ambulation Related to ADLs: Min A for ambulation and transfers. ADL Comments: Pt only able to ambulate short distance due to pain. Pt at overall Mod A level for LB ADLs without AE. OT spoke with pt about using AE next session for LB ADLs.     OT Diagnosis: Acute pain  OT Problem List: Decreased strength;Decreased activity tolerance;Decreased knowledge of use of DME or AE;Pain OT Treatment Interventions: Self-care/ADL training;DME and/or AE instruction;Therapeutic activities;Patient/family education   OT Goals Acute Rehab OT Goals OT Goal Formulation: With patient Time For Goal Achievement: 09/19/12 Potential to Achieve Goals: Good ADL Goals Pt Will Perform Grooming: with modified independence;Standing at sink ADL Goal: Grooming - Progress: Goal set today Pt Will Perform Lower Body Bathing: with modified independence;Sit to stand from chair;with adaptive equipment ADL Goal: Lower Body Bathing - Progress: Goal set today Pt Will Perform Lower Body Dressing: with modified independence;Sit to stand from bed;Sit to stand from chair;with adaptive equipment ADL Goal: Lower Body Dressing - Progress: Goal set today Pt Will Transfer to Toilet: with modified independence;Ambulation ADL Goal: Toilet Transfer - Progress: Goal set today Pt Will Perform Toileting - Clothing Manipulation: with modified independence;Standing ADL Goal: Toileting - Clothing Manipulation - Progress: Goal set today Pt Will Perform Toileting - Hygiene: with modified independence;Sit to stand from 3-in-1/toilet;Sitting on 3-in-1 or toilet ADL Goal: Toileting - Hygiene - Progress: Goal set today Pt Will Perform Tub/Shower Transfer: Tub transfer;with supervision;Ambulation;with DME ADL Goal: Tub/Shower Transfer - Progress: Goal set today  Visit Information  Last OT Received On: 09/12/12 Assistance Needed: +1 (+2 for chair to follow) PT/OT Co-Evaluation/Treatment:  Yes    Subjective Data      Prior Functioning     Home Living  Lives With: Alone Available Help at Discharge: Family Type of Home: House Home Access: Stairs to enter Home Layout: Two level;Bed/bath upstairs Bathroom Shower/Tub: Engineer, manufacturing systems: Standard Prior Function Level of Independence: Independent (prior to a week or so due to back pain) Communication Communication: No difficulties         Vision/Perception     Cognition  Cognition Arousal/Alertness: Awake/alert Behavior During Therapy: WFL for tasks assessed/performed Overall Cognitive Status: Within Functional Limits for tasks assessed    Extremity/Trunk Assessment Right Upper Extremity Assessment RUE ROM/Strength/Tone: [facility] for tasks assessed Left Upper Extremity Assessment LUE ROM/Strength/Tone: WFL for tasks assessed     Mobility Bed Mobility Bed Mobility: Sit to Supine Sit to Supine: 3: Mod assist Details for Bed Mobility Assistance: Assistance to lift both LE's into bed.  Transfers Transfers: Sit to Stand;Stand to Sit Sit to Stand: 4: Min assist;With upper extremity assist;From bed Stand to Sit: 4: Min assist;With upper extremity assist;To chair/3-in-1;To bed Details for Transfer Assistance: Cues for hand placement.      Exercise     Balance     End of Session OT - End of Session Equipment Utilized During Treatment: Gait belt Activity Tolerance: Patient limited by fatigue;Patient limited by pain Patient left: in bed;with call bell/phone within reach;with family/visitor present Nurse Communication: Mobility status  GO Functional Assessment Tool Used: clinical judgment Functional Limitation: Self care Self Care Current Status (Z6109): At least 20 percent but less than 40 percent impaired, limited or restricted Self Care Goal Status (U0454): 0 percent impaired, limited or restricted   Earlie Raveling OTR/L [phone] 09/12/2012, 3:57 PM

## 2012-09-13 ENCOUNTER — Encounter (HOSPITAL_COMMUNITY): Payer: Self-pay | Admitting: Neurological Surgery

## 2012-09-13 NOTE — Progress Notes (Signed)
Occupational Therapy Treatment Patient Details Name: Glenda [name] MRN: [id number] DOB: 21-Nov-1924 Today's Date: 09/13/2012 Time: 4098-1191 OT Time Calculation (min): 34 min  OT Assessment / Plan / Recommendation  OT comments  Transferred from bed to chair. Practiced log rolling technique and also practiced with AE for LB ADLs.   Follow Up Recommendations  SNF    Barriers to Discharge       Equipment Recommendations  Other (comment) (tbd)    Recommendations for Other Services    Frequency Min 2X/week   Progress towards OT Goals Progress towards OT goals: Progressing toward goals  Plan Discharge plan remains appropriate    Precautions / Restrictions Precautions Precautions: Fall Restrictions Weight Bearing Restrictions: No   Pertinent Vitals/Pain Pain 7/10 in back. Repositioned.     ADL  Lower Body Dressing: Performed;Moderate assistance Where Assessed - Lower Body Dressing: Unsupported sitting;Supported sitting Toilet Transfer: Simulated;Minimal assistance Toilet Transfer Method: Sit to stand Toilet Transfer Equipment: Other (comment) (sit to stand from bed/chair) Equipment Used: Gait belt;Rolling walker ADL Comments: Practiced with AE. Pt requiring Mod A for donning/doffing socks with reacher and sockaid. Educated on dressing technique for underwear and pants.    OT Diagnosis:    OT Problem List:   OT Treatment Interventions:     OT Goals(current goals can now be found in the care plan section) ADL Goals Pt Will Perform Grooming: with modified independence;standing Pt Will Perform Lower Body Bathing: with modified independence;sit to/from stand;with adaptive equipment Pt Will Perform Lower Body Dressing: with modified independence;sit to/from stand;with adaptive equipment Pt Will Transfer to Toilet: with modified independence;ambulating  Visit Information  Last OT Received On: 09/13/12 Assistance Needed: +1    Subjective Data      Prior Functioning       Cognition  Cognition Arousal/Alertness: Awake/alert Behavior During Therapy: WFL for tasks assessed/performed Overall Cognitive Status: Within Functional Limits for tasks assessed    Mobility  Bed Mobility Bed Mobility: Rolling Right;Right Sidelying to Sit;Sit to Sidelying Right;Sitting - Scoot to Edge of Bed;Scooting to [facility] Rolling Right: 4: Min assist Right Sidelying to Sit: 4: Min assist Sitting - Scoot to Edge of Bed: 4: Min guard Sit to Sidelying Right: 3: Mod assist Scooting to [facility] 3: Mod assist Details for Bed Mobility Assistance: Mod A to lift legs onto bed from sitting to sidelying position. A to roll and cues for log rolling technique. Transfers Transfers: Sit to Stand;Stand to Sit Sit to Stand: 4: Min assist;From bed;From chair/3-in-1;With upper extremity assist Stand to Sit: 4: Min assist;To chair/3-in-1;To bed;With upper extremity assist Details for Transfer Assistance: several cues for hand placement.        Balance     End of Session OT - End of Session Equipment Utilized During Treatment: Gait belt;Rolling walker Activity Tolerance: Patient limited by pain Patient left: in bed;with call bell/phone within reach;with family/visitor present;with bed alarm set  GO     Earlie Raveling OTR/L [phone] 09/13/2012, 4:25 PM

## 2012-09-13 NOTE — Care Management Note (Unsigned)
    Page 1 of 1   09/13/2012     5:30:19 PM   CARE MANAGEMENT NOTE 09/13/2012  Patient:  Glenda [name], Glenda [name]   Account Number:  [id number]  Date Initiated:  09/12/2012  Documentation initiated by:  Elmer Bales  Subjective/Objective Assessment:   Pt admitted for sacroplasty. Lives at home alone.     Action/Plan:   Will follow for discharge needs.   Anticipated DC Date:  09/12/2012   Anticipated DC Plan:  HOME W HOME HEALTH SERVICES  In-house referral  Clinical Social Worker      DC Planning Services  CM consult      Choice offered to / List presented to:             Status of service:  In process, will continue to follow Medicare Important Message given?   (If response is "NO", the following Medicare IM given date fields will be blank) Date Medicare IM given:   Date Additional Medicare IM given:    Discharge Disposition:    Per UR Regulation:  Reviewed for med. necessity/level of care/duration of stay  If discussed at Long Length of Stay Meetings, dates discussed:    Comments:  09/13/12 1630  Elmer Bales RN, MSN CM-  Met with patient and family to discuss discharge planning.  CM explained that patient does not currently meet for inpatient status, which effects the ability to transfer patient to a SNF with insurance coverage.  Family is aware of the probability of SNF being private pay and are willing to accept responsibility.  CM will ask CSW to speak with family in the morning to further explain and assist with the process.  Family is agreeable to this.   09/13/12 1410  Elmer Bales RN, MSN, CM-  Message left with Dr Verlee Rossetti office regarding patient needing to be changed back to observation status.  Await return call.

## 2012-09-13 NOTE — Progress Notes (Signed)
Physical Therapy Treatment Patient Details Name: Glenda [name] MRN: [id number] DOB: [birth year] Today's Date: 09/13/2012 Time: 1308-6578 PT Time Calculation (min): 19 min  PT Assessment / Plan / Recommendation  PT Comments   Reports minimal improvement today with pain. Able to ambulate further. Performed gentle lower extremity exercises in supine.   Follow Up Recommendations  SNF     Does the patient have the potential to tolerate intense rehabilitation     Barriers to Discharge        Equipment Recommendations  None recommended by PT    Recommendations for Other Services    Frequency Min 5X/week   Progress towards PT Goals Progress towards PT goals: Progressing toward goals  Plan Current plan remains appropriate    Precautions / Restrictions Precautions Precautions: Fall Restrictions Weight Bearing Restrictions: No   Pertinent Vitals/Pain Reports pain in back slightly improved from yesterday, RN aware patient up in the chair and may need to get back to bed quickly if her back starts to bother her    Mobility  Bed Mobility Bed Mobility: Rolling Right;Right Sidelying to Sit Rolling Right: 4: Min assist Right Sidelying to Sit: 3: Mod assist Details for Bed Mobility Assistance: sequencing cues and facilitation for follow through Transfers Transfers: Sit to Stand;Stand to Sit Sit to Stand: From bed;1: +2 Total assist Sit to Stand: Patient Percentage: 60% Stand to Sit: To chair/3-in-1;1: +2 Total assist Stand to Sit: Patient Percentage: 60% Details for Transfer Assistance: Cues for hand placement, facilitation for anterior translation and lift off Ambulation/Gait Ambulation/Gait Assistance: 4: Min assist Ambulation Distance (Feet): 12 Feet Assistive device: Rolling walker Ambulation/Gait Assistance Details: cues for tall posture Gait Pattern: Step-through pattern;Trunk flexed;Shuffle;Decreased stride length Gait velocity: decreased    Exercises General Exercises -  Lower Extremity Heel Slides: AAROM;Both;10 reps;Supine Hip ABduction/ADduction: AAROM;Both;10 reps;Supine   PT Goals Acute Rehab PT Goals PT Goal Formulation: With patient Time For Goal Achievement: 09/19/12 Potential to Achieve Goals: Good  Visit Information  Last PT Received On: 09/13/12 Assistance Needed: +2 (chair follow)    Subjective Data      Cognition  Cognition Overall Cognitive Status: Within Functional Limits for tasks assessed    Balance     End of Session PT - End of Session Equipment Utilized During Treatment: Gait belt Activity Tolerance: Patient tolerated treatment well Patient left: in chair;with call bell/phone within reach Nurse Communication: Mobility status   GP     [facility] [name] 09/13/2012, 1:17 PM

## 2012-09-13 NOTE — Progress Notes (Signed)
Subjective: Patient reports Still with substantial back pain. Mobilization is limited. PT and OT recommend snf placement  Objective: Vital signs in last 24 hours: Temp:  [97.7 F (36.5 C)-98.3 F (36.8 C)] 97.7 F (36.5 C) (06/25 0631) Pulse Rate:  [50-56] 50 (06/25 0631) Resp:  [18-20] 18 (06/25 0631) BP: (141-190)/(52-86) 169/86 mmHg (06/25 0631) SpO2:  [96 %-99 %] 96 % (06/25 0631)  Intake/Output from previous day: 06/24 0701 - 06/25 0700 In: 3 [I.V.:3] Out: -  Intake/Output this shift:    incision clean dry motor function is good when not weightbearing.  Lab Results:  Recent Labs  09/11/12 1340 09/11/12 2114  WBC 5.8 9.3  HGB 13.8 13.6  HCT 39.8 38.4  PLT 231 211   BMET  Recent Labs  09/11/12 1340 09/11/12 2114  NA 134*  --   K 4.8  --   CL 98  --   CO2 27  --   GLUCOSE 104*  --   BUN 24*  --   CREATININE 1.02 0.87  CALCIUM 9.0  --     Studies/Results: Dg Sacrum/coccyx  09/11/2012   *RADIOLOGY REPORT*  Clinical Data: Acrylic sacral plasty for insufficiency fracture and severe back pain  SACRUM AND COCCYX - 2+ VIEW  Comparison: MR lumbar spine of 08/31/2012  Findings: Two C-arm spot films show injection of contrast into the right sacral ala.  IMPRESSION: C-arm fluoroscopy images showing right sacral plasty   Original Report Authenticated By: Dwyane Dee, M.D.   Dg C-arm 1-60 Min  09/11/2012   *RADIOLOGY REPORT*  Clinical Data: Right acrylic sacral plasty  DG C-ARM 1-60 MIN  Comparison:  MR lumbar spine of 08/31/2012  Findings: C-arm fluoroscopy was provided during right acrylic sacral plasty.  Fluoroscopy time was 0.53 seconds.  IMPRESSION: C-arm fluoroscopy provided.   Original Report Authenticated By: Dwyane Dee, M.D.    Assessment/Plan: Stable await sniff placement  LOS: 2 days  Skilled nursing facility placement   Landrum Carbonell J 09/13/2012, 8:52 AM

## 2012-09-14 ENCOUNTER — Inpatient Hospital Stay (HOSPITAL_COMMUNITY): Payer: Medicare Other

## 2012-09-14 DIAGNOSIS — I10 Essential (primary) hypertension: Secondary | ICD-10-CM

## 2012-09-14 MED ORDER — AMLODIPINE BESYLATE 5 MG PO TABS
5.0000 mg | ORAL_TABLET | Freq: Every day | ORAL | Status: DC
  Administered 2012-09-14 – 2012-09-15 (×2): 5 mg via ORAL
  Filled 2012-09-14 (×3): qty 1

## 2012-09-14 MED ORDER — HYDRALAZINE HCL 20 MG/ML IJ SOLN
10.0000 mg | Freq: Four times a day (QID) | INTRAMUSCULAR | Status: DC | PRN
  Administered 2012-09-14: 10 mg via INTRAVENOUS
  Filled 2012-09-14: qty 1

## 2012-09-14 MED ORDER — CLONIDINE HCL 0.1 MG PO TABS
0.1000 mg | ORAL_TABLET | Freq: Once | ORAL | Status: AC
  Administered 2012-09-14: 0.1 mg via ORAL
  Filled 2012-09-14: qty 1

## 2012-09-14 NOTE — Progress Notes (Signed)
PT Cancellation Note  Patient Details Name: NEMA OATLEY MRN: [id number] DOB: Dec 14, 1924   Cancelled Treatment:    Reason Eval/Treat Not Completed: Pain limiting ability to participate.  Attempted to see pt this PM however pt just finished working with OT and c/o 10/10 back pain.  Will attempt to see tomorrow.   Jacquette Canales 09/14/2012, 4:30 PM Jake Shark, PT DPT [phone]

## 2012-09-14 NOTE — Progress Notes (Signed)
Occupational Therapy Treatment Patient Details Name: LORRINE KILLILEA MRN: [id number] DOB: [birth year] Today's Date: 09/14/2012 Time: 4098-1191 OT Time Calculation (min): 24 min  OT Assessment / Plan / Recommendation  OT comments  Pt limited in session due to pain. Pt performed toileting tasks and grooming at sink. Educated on toilet aid to assist with hygiene to prevent bending.   Follow Up Recommendations  SNF    Barriers to Discharge       Equipment Recommendations  Other (comment) (tbd)    Recommendations for Other Services    Frequency Min 2X/week   Progress towards OT Goals Progress towards OT goals: Progressing toward goals  Plan Discharge plan remains appropriate    Precautions / Restrictions Precautions Precautions: Fall Restrictions Weight Bearing Restrictions: No   Pertinent Vitals/Pain Pain 10/10. Repositioned and pt was premedicated.     ADL  Grooming: Performed;Wash/dry hands;Min guard Where Assessed - Grooming: Supported Copywriter, advertising: Performed;Minimal Dentist Method: Sit to Barista: Raised toilet seat with arms (or 3-in-1 over toilet) Toileting - Clothing Manipulation and Hygiene: Performed;Minimal assistance Where Assessed - Engineer, mining and Hygiene: Sit to stand from 3-in-1 or toilet Equipment Used: Gait belt;Rolling walker Transfers/Ambulation Related to ADLs: Min A level ADL Comments: Pt performed toileting tasks. OT educated on toilet aid to prevent bending when perfoming hygiene. Pt limited in session due to pain.    OT Diagnosis:    OT Problem List:   OT Treatment Interventions:     OT Goals(current goals can now be found in the care plan section) ADL Goals Pt Will Perform Grooming: with modified independence;standing Pt Will Perform Lower Body Bathing: with modified independence;sit to/from stand;with adaptive equipment Pt Will Perform Lower Body Dressing: with modified  independence;sit to/from stand;with adaptive equipment Pt Will Transfer to Toilet: with modified independence;ambulating Pt Will Perform Toileting - Clothing Manipulation and hygiene: with modified independence;sit to/from stand Pt Will Perform Tub/Shower Transfer: Tub transfer;ambulating;shower seat  Visit Information  Last OT Received On: 09/14/12 Assistance Needed: +1    Subjective Data      Prior Functioning       Cognition  Cognition Arousal/Alertness: Awake/alert Behavior During Therapy: WFL for tasks assessed/performed Overall Cognitive Status: Within Functional Limits for tasks assessed    Mobility  Bed Mobility Bed Mobility: Rolling Right;Right Sidelying to Sit;Sit to Sidelying Right;Scooting to [facility];Sitting - Scoot to [place] of Bed Rolling Right: 4: Min assist Right Sidelying to Sit: 4: Min assist Sitting - Scoot to Edge of Bed: 4: Min guard Sit to Sidelying Right: 3: Mod assist Scooting to [facility]: 2: Max assist (trendlenburg position ) Details for Bed Mobility Assistance: Assistance to lift LE's onto bed. Cues for technique. Transfers Transfers: Sit to Stand;Stand to Sit Sit to Stand: 4: Min assist;With upper extremity assist;From bed;From chair/3-in-1 Stand to Sit: 4: Min assist;To chair/3-in-1;To bed;With upper extremity assist Details for Transfer Assistance: Several cues for hand placement.     Exercises      Balance     End of Session OT - End of Session Equipment Utilized During Treatment: Gait belt;Rolling walker Activity Tolerance: Patient limited by pain Patient left: in bed;with call bell/phone within reach  GO     Earlie Raveling OTR/L [phone] 09/14/2012, 4:42 PM

## 2012-09-14 NOTE — Clinical Social Work Placement (Addendum)
Clinical Social Work Department CLINICAL SOCIAL WORK PLACEMENT NOTE 09/14/2012  Patient:  Glenda [name], Glenda [name]  Account Number:  [id number] Admit date:  09/11/2012  Clinical Social Worker:  Vonita Moss, Theresia Majors  Date/time:     Clinical Social Work is seeking post-discharge placement for this patient at the following level of care:   SKILLED NURSING   (*CSW will update this form in Epic as items are completed)   09/14/2012  Patient/family provided with [facility] Department of Clinical Social Work's list of facilities offering this level of care within the geographic area requested by the patient (or if unable, by the patient's family).  09/14/2012  Patient/family informed of their freedom to choose among providers that offer the needed level of care, that participate in Medicare, Medicaid or managed care program needed by the patient, have an available bed and are willing to accept the patient.  09/14/2012  Patient/family informed of [facility]' ownership interest in [facility], as well as of the fact that they are under no obligation to receive care at this facility.  PASARR submitted to EDS on 09/14/2012 PASARR number received from EDS on 09/14/2012  FL2 transmitted to all facilities in geographic area requested by pt/family on  09/14/2012 FL2 transmitted to all facilities within larger geographic area on   Patient informed that his/her managed care company has contracts with or will negotiate with  certain facilities, including the following:     Patient/family informed of bed offers received:  09/14/2012 Patient chooses bed at [facility] Physician recommends and patient chooses bed at    Patient to be transferred to [facility] on  09/15/2012 Patient to be transferred to facility by [facility]  The following physician request were entered in Epic:   Additional Comments: 06/26 - Patient family prepared to pay privately if inpatient status is not obtained

## 2012-09-14 NOTE — Progress Notes (Addendum)
Subjective: Patient reports Back pain and mobility is still a major issue. Patient has required assistance of at least one or more commonly to individuals to ambulate. Sacral pain only tolerable with strong narcotic analgesics. hypertension noted last night.  Objective: Vital signs in last 24 hours: Temp:  [97.8 F (36.6 C)-98.1 F (36.7 C)] 98.1 F (36.7 C) (06/26 0501) Pulse Rate:  [47-67] 67 (06/26 0700) Resp:  [18-19] 19 (06/26 0501) BP: (150-204)/(68-98) 161/83 mmHg (06/26 0700) SpO2:  [97 %-99 %] 97 % (06/26 0501)  Intake/Output from previous day:   Intake/Output this shift:    Incision is clean and dry motor function appears intact with patient nonweightbearing.  Lab Results:  Recent Labs  09/11/12 1340 09/11/12 2114  WBC 5.8 9.3  HGB 13.8 13.6  HCT 39.8 38.4  PLT 231 211   BMET  Recent Labs  09/11/12 1340 09/11/12 2114  NA 134*  --   K 4.8  --   CL 98  --   CO2 27  --   GLUCOSE 104*  --   BUN 24*  --   CREATININE 1.02 0.87  CALCIUM 9.0  --     Studies/Results: No results found.  Assessment/Plan: Perform CT of the sacral region two-view area of sacral plasty  LOS: 3 days  Continue PT OT. obtain hospitalist consult for evaluation and control of hypertension   Janie Capp J 09/14/2012, 8:53 AM

## 2012-09-14 NOTE — Clinical Social Work Note (Signed)
Clinical Social Worker continuing to follow patient and family for support and discharge planning needs.  CSW spoke with patient son over the phone to confirm patient discharge plans.  Patient son states that he visited facilities and has chosen [facility].  Facility is aware of bed acceptance and plans to make arrangements for paperwork completion with the family.  CSW spoke with MD over the phone who is awaiting Hospitalist consult completion for Hypertension prior to patient discharge.  MD states that patient will be ready for discharge tomorrow - facility and family aware.  CSW remains available for support and to facilitate patient discharge needs once medically stable.  Macario Golds, [place] [phone]

## 2012-09-14 NOTE — Progress Notes (Signed)
Delayed entry: Dr. Franky Macho paged around [phone] regarding pt's high BP. Patient already given pain medicine earlier in the night, repositioned to help with pain and help decrease BP. Clonidine 0.1mg  ordered one time dose to be given. Med given at [phone]. BP decreased from 198/70 to 161/83. Will pass on in report. Salvadore Oxford, RN 09/14/12

## 2012-09-14 NOTE — Clinical Social Work Psychosocial (Signed)
Clinical Social Work Department BRIEF PSYCHOSOCIAL ASSESSMENT 09/14/2012  Patient:  Glenda [name], Glenda [name]     Account Number:  [id number]     Admit date:  09/11/2012  Clinical Social Worker:  Read Drivers  Date/Time:  09/14/2012 10:07 AM  Referred by:  Physician  Date Referred:  09/14/2012 Referred for  SNF Placement   Other Referral:   none   Interview type:  Patient Other interview type:   CSW spoke to Wanda.  CSW also spoke to son and pt at bedside.    PSYCHOSOCIAL DATA Living Status:  ALONE Admitted from facility:   Level of care:   Primary support name:  Lawson Fiscal and Jomarie Longs Primary support relationship to patient:  CHILD, ADULT Degree of support available:   strong    CURRENT CONCERNS Current Concerns  Post-Acute Placement   Other Concerns:   none    SOCIAL WORK ASSESSMENT / PLAN CSW assessed pt at bedside along with son, Jomarie Longs.  CSW also spoke with Lawson Fiscal re: disposition and d/c plans.  Pt and family requesting pt be admitted to SNF.  PT/OT also recommends SNF upon d/c.  Family is willing to private pay for facility.  Family first choice is [facility].  CSW contacted [facility] and they report having no availability at this time.  Family has also requested [facility] as their 2nd and 3rd choices.  CSW contacted all three facilites.  [facility] and [facility] is reviewing the pt information.  Both remaining choices are aware the pt is ready for d/c.   Assessment/plan status:  Psychosocial Support/Ongoing Assessment of Needs Other assessment/ plan:   none   Information/referral to community resources:   SNF    PATIENT'S/FAMILY'S RESPONSE TO PLAN OF CARE: Pt and family extremely appreciative of CSW support and assistance.       Vickii Penna, LCSWA [phone]  Clinical Social Work

## 2012-09-14 NOTE — Clinical Social Work Note (Signed)
CSW spoke with Lawson Fiscal re: disposition/plans.  Lawson Fiscal reports family requests SNF then potential move to ALF.  CSW and Lawson Fiscal reviewed the list of offers.  Family first choice: [facility], [facility] and [facility].  CSW received permission to fax pt information out to all of [facility].  CSW completed this task.  CSW requested that family also f/u with [facility] in hopes to expedite this process.    FL2 is on chart for MD to sign.  Vickii Penna, LCSWA [phone]  Clinical Social Work

## 2012-09-14 NOTE — Consult Note (Signed)
[facility] Medical Consultation  Glenda [name] ZOX:[id number] DOB: 1925-03-13 DOA: 09/11/2012 PCP: Cassell Clement, MD   Requesting physician: Danielle Dess Date of consultation: 6/26 Reason for consultation: HTN  Impression/Recommendations Active Problems:   HTN (hypertension)    1. HTN- ? Pain component- on toprol, cozaar already, add norvasc and adjust accordingly, PRN hydralazine (allergy to talwin is "hyperactivity" per patient)-- patient was on norvasc in past with swelling issues but was alos taking meloxicam which can also cause swelling- monitor-- may need oral hydralazine  I will followup again tomorrow. Please contact me if I can be of assistance in the meanwhile. Thank you for this consultation.    Chief Complaint: BP high last night    HPI:  79 yo female who comes in with back pain and was found to have right sacral insufficiency fracture.  Last PM she developed some HTN.  BP was from 180s-200s systolically.  Hospitalist were consulted to help control.  Patient is asymptomatic.  No head ache, no chest pain.  She does endorse some back pain.  No fever, no chills, urinating well.   Review of Systems:  All systems reviewed, negative unless stated above   Past Medical History  Diagnosis Date  . Hypertension   . Peripheral edema   . PVCs (premature ventricular contractions)   . TIA (transient ischemic attack)   . Hiatal hernia   . Diverticulosis   . OA (osteoarthritis) of knee   . Hypercholesterolemia   . Insomnia   . DDD (degenerative disc disease), lumbar     severe facet dz and adv DDD MRI L spine 2009  . Aortic stenosis     Echo 02/2011 showing mild AS with normal LV systolic function   Past Surgical History  Procedure Laterality Date  . Cardiac catheterization  02/17/2001    MILD REGURGITATION. EF 60%  . Colonoscopy    . Cesarean section    . Hemorroidectomy    . Breast biopsy      LEFT BREAST  . Kyphoplasty Right 09/11/2012    Procedure:  Right Acrylic Sacroplasty;  Surgeon: Barnett Abu, MD;  Location: [facility];  Service: Neurosurgery;  Laterality: Right;  Right  Acrylic Sacroplasty   Social History:  reports that she has never smoked. She does not have any smokeless tobacco history on file. She reports that she does not drink alcohol or use illicit drugs.  Allergies  Allergen Reactions  . Amitriptyline     hyper  . Avelox (Moxifloxacin Hcl In Nacl) Other (See Comments)    dizziness  . Halcion (Triazolam)   . Pentazocine Lactate   . Sulfa Drugs Cross Reactors   . Trazodone And Nefazodone     Not effective  . Penicillins Rash   Family History  Problem Relation Age of Onset  . Heart disease Mother   . Hypertension Father   . Heart disease Sister     Prior to Admission medications   Medication Sig Start Date End Date Taking? Authorizing Provider  alum & mag hydroxide-simeth (MAALOX/MYLANTA) 200-200-20 MG/5ML suspension Take 5 mLs by mouth as needed. For indigestion   Yes Historical Provider, MD  aspirin 81 MG tablet Take 81 mg by mouth daily.   Yes Historical Provider, MD  atorvastatin (LIPITOR) 20 MG tablet Take 1 tablet (20 mg total) by mouth daily. 08/21/12  Yes Cassell Clement, MD  Cholecalciferol (VITAMIN D) 1000 UNITS capsule Take 1,000 Units by mouth daily.     Yes Historical Provider, MD  furosemide (  LASIX) 20 MG tablet Take 1 tablet (20 mg total) by mouth daily as needed. 08/02/12  Yes Cassell Clement, MD  losartan (COZAAR) 100 MG tablet Take 1 tablet (100 mg total) by mouth daily. 10/07/11  Yes Cassell Clement, MD  meloxicam (MOBIC) 7.5 MG tablet Take 7.5 mg by mouth daily.   Yes Historical Provider, MD  metoprolol succinate (TOPROL-XL) 25 MG 24 hr tablet Take 1 tablet (25 mg total) by mouth daily. Take with or immediately following a meal. 07/11/12  Yes Cassell Clement, MD  Multiple Vitamin (MULITIVITAMIN WITH MINERALS) TABS Take 1 tablet by mouth daily.   Yes Historical Provider, MD   oxyCODONE-acetaminophen (PERCOCET/ROXICET) 5-325 MG per tablet Take 1 tablet by mouth every 8 (eight) hours as needed for pain. 08/31/12  Yes Hannah Muthersbaugh, PA-C  polyethylene glycol (MIRALAX / GLYCOLAX) packet Take 17 g by mouth as needed. For constipation   Yes Historical Provider, MD  temazepam (RESTORIL) 15 MG capsule Take 1 capsule (15 mg total) by mouth at bedtime as needed. Insomnia 07/06/12  Yes Cassell Clement, MD  traMADol (ULTRAM) 50 MG tablet Take 50 mg by mouth every 6 (six) hours as needed for pain.   Yes Historical Provider, MD  vitamin B-12 (CYANOCOBALAMIN) 500 MCG tablet Take 500 mcg by mouth daily.   Yes Historical Provider, MD  vitamin C (ASCORBIC ACID) 500 MG tablet Take 500 mg by mouth daily.   Yes Historical Provider, MD   Physical Exam: Blood pressure 179/73, pulse 49, temperature 98 F (36.7 C), temperature source Oral, resp. rate 18, height 5\' 2"  (1.575 m), weight 61.2 kg (134 lb 14.7 oz), SpO2 97.00%. Filed Vitals:   09/14/12 0253 09/14/12 0501 09/14/12 0700 09/14/12 1000  BP: 192/69 198/70 161/83 179/73  Pulse: 48 51 67 49  Temp: 98 F (36.7 C) 98.1 F (36.7 C)  98 F (36.7 C)  TempSrc: Oral Oral  Oral  Resp: 18 19  18   Height:      Weight:      SpO2: 97% 97%  97%     General:  A+Ox3, NAD  Eyes: wnl  ENT: wnl  Neck: supple  Cardiovascular: rrr  Respiratory: clear anterior  Abdomen: +BS, soft, NT  Skin: no rashes or lesions  Musculoskeletal: +BS, soft  Psychiatric: normal mood/affect  Neurologic: CN 2-12  Labs on Admission:  Basic Metabolic Panel:  Recent Labs Lab 09/11/12 1340 09/11/12 2114  NA 134*  --   K 4.8  --   CL 98  --   CO2 27  --   GLUCOSE 104*  --   BUN 24*  --   CREATININE 1.02 0.87  CALCIUM 9.0  --    Liver Function Tests: No results found for this basename: AST, ALT, ALKPHOS, BILITOT, PROT, ALBUMIN,  in the last 168 hours No results found for this basename: LIPASE, AMYLASE,  in the last 168 hours No  results found for this basename: AMMONIA,  in the last 168 hours CBC:  Recent Labs Lab 09/11/12 1340 09/11/12 2114  WBC 5.8 9.3  HGB 13.8 13.6  HCT 39.8 38.4  MCV 86.9 85.9  PLT 231 211   Cardiac Enzymes: No results found for this basename: CKTOTAL, CKMB, CKMBINDEX, TROPONINI,  in the last 168 hours BNP: No components found with this basename: POCBNP,  CBG: No results found for this basename: GLUCAP,  in the last 168 hours  Radiological Exams on Admission: No results found.    Time spent: 70 min  Sammy Cassar,  Muath Hallam [facility] Pager [phone]  If 7PM-7AM, please contact night-coverage [email or web address] Password [facility] 09/14/2012, 12:17 PM

## 2012-09-14 NOTE — Progress Notes (Signed)
PT Cancellation Note  Patient Details Name: Glenda [name] MRN: [id number] DOB: [birth year]   Cancelled Treatment:    Reason Eval/Treat Not Completed: Patient at procedure or test/unavailable. Will f/u tomorrow.   [facility] [name] 09/14/2012, 3:45 PM Pager: [phone]

## 2012-09-15 ENCOUNTER — Other Ambulatory Visit: Payer: Self-pay | Admitting: *Deleted

## 2012-09-15 DIAGNOSIS — I5032 Chronic diastolic (congestive) heart failure: Secondary | ICD-10-CM

## 2012-09-15 DIAGNOSIS — G4700 Insomnia, unspecified: Secondary | ICD-10-CM

## 2012-09-15 DIAGNOSIS — I359 Nonrheumatic aortic valve disorder, unspecified: Secondary | ICD-10-CM

## 2012-09-15 DIAGNOSIS — I10 Essential (primary) hypertension: Secondary | ICD-10-CM

## 2012-09-15 MED ORDER — TEMAZEPAM 15 MG PO CAPS: ORAL_CAPSULE | ORAL | Status: DC

## 2012-09-15 MED ORDER — OXYCODONE-ACETAMINOPHEN 5-325 MG PO TABS: 1.0000 | ORAL_TABLET | Freq: Three times a day (TID) | ORAL | Status: DC | PRN

## 2012-09-15 MED ORDER — TEMAZEPAM 15 MG PO CAPS: 15.0000 mg | ORAL_CAPSULE | Freq: Every evening | ORAL | Status: DC | PRN

## 2012-09-15 MED ORDER — BISACODYL 10 MG RE SUPP: 10.0000 mg | Freq: Every day | RECTAL | Status: DC | PRN

## 2012-09-15 MED ORDER — OXYCODONE-ACETAMINOPHEN 5-325 MG PO TABS: ORAL_TABLET | ORAL | Status: DC

## 2012-09-15 NOTE — Progress Notes (Addendum)
Current Chart and outpatient chart reviewed.  Discussed with Dr. Danielle Dess.  Subjective: Denies dyspnea or chest pain. No headache. No visual changes. Does feel an occasional palpitation which is typical for her, and she reportedly has a history of PVCs. No edema. Sacral pain continues  Objective: Vital signs in last 24 hours: Temp:  [97.7 F (36.5 C)-98.4 F (36.9 C)] 98.2 F (36.8 C) (06/27 0521) Pulse Rate:  [49-67] 57 (06/27 0521) Resp:  [16-19] 19 (06/27 0521) BP: (111-189)/(48-78) 151/74 mmHg (06/27 0521) SpO2:  [97 %-98 %] 98 % (06/27 0521) Weight change:  Last BM Date: 09/11/12  Intake/Output from previous day: 06/26 0701 - 06/27 0700 In: 960 [P.O.:960] Out: -  Intake/Output this shift:   General: Comfortable lying flat. Alert and oriented. Breathing nonlabored. Lungs clear to auscultation anteriorly without wheezes rhonchi or rales Cardiovascular regular rate rhythm without murmurs gallops rubs Abdomen soft nontender nondistended Extremities sequential compression devices in place. No clubbing cyanosis or edema  Lab Results: No results found for this basename: WBC, HGB, HCT, PLT,  in the last 72 hours BMET No results found for this basename: NA, K, CL, CO2, GLUCOSE, BUN, CREATININE, CALCIUM,  in the last 72 hours  Studies/Results: Ct Pelvis Wo Contrast  09/14/2012   *RADIOLOGY REPORT*  Clinical Data: Severe pain status post sacroplasty. Unable to ambulate independently.  CT PELVIS WITHOUT CONTRAST  Technique:  Multidetector CT imaging of the pelvis was performed following the standard protocol without intravenous contrast.  Comparison: Preoperative MRI 08/31/2012.  Intraoperative C-arm films 09/11/2012.  Findings: There is an oblique fracture across the right L5 pedicle and pars interarticularis which exits inferiorly into the foramen ( see for instance image 11 series 2). This was not present on the pre procedure MRI as best can be determined from comparing today's CT with  the prior MR.  The L5- S1 disc space is quite narrow.  There is 2 mm retrolisthesis, related to advanced facet arthropathy.  There is calcified protrusion in the canal which extends into both neural foramina, greater on the right.  There is severe narrowing of both neural foramina, right greater than left, with likely  severe compression of the right L5 nerve root.  Radiopaque cement is located in the right sacral ala.  Some cement extends into the right S1  foramen (images 15 - 17 series 2, and image 47 series 300).  Correlate clinically for right S1 radiculopathy.  IMPRESSION:There is an oblique acute fracture across the right L5 pedicle and pars interarticularis which exits inferiorly into the foramen.  Calcified disc protrusion at L5-S1 extends into both neural foramina, right greater than left which in combination with facet arthropathy, and slip severely compresses the right greater than left L5 nerve root.  Radiopaque  cement is located in the right sacral ala and extends into the right S1 neural foramen.   Original Report Authenticated By: Davonna Belling, M.D.     Assessment/Plan:    Accelerated/malignant hypertension:  BP better.  (Had been  Ranging 180-205/70-100). Recommend continued close monitoring and medication adjustment at [facility]    Aortic stenosis, mild    Chronic diastolic HF (heart failure), no evidence of acute heart failure    LOS: 4 days   Fallan Mccarey L 09/15/2012, 9:08 AM

## 2012-09-15 NOTE — Progress Notes (Signed)
PT Cancellation Note  Patient Details Name: Glenda [name] MRN: [id number] DOB: Feb 25, 1925   Cancelled Treatment:    Reason Eval/Treat Not Completed: Plans to d/c SNF today. Spoke with son who is agreeable deferring therapy to [facility]. PT signing off.   [facility] [name] 09/15/2012, 12:46 PM Ivonne Andrew PT, DPT Pager: [phone]

## 2012-09-15 NOTE — Discharge Summary (Signed)
Physician Discharge Summary  Patient ID: Glenda [name] MRN: [id number] DOB/AGE: [birth year] 79 y.o.  Admit date: 09/11/2012 Discharge date: 09/15/2012  Admission Diagnoses: Sacral insufficiency   Discharge Diagnoses: Sacral insufficiency fracture, hypertension, L5 pedicle fracture, back pain Active Problems:   HTN (hypertension)   Discharged Condition: fair  Hospital Course: Patient was admitted to undergo acrylic sacral plasty. She tolerated procedure well however she had persistent pain in the region of her right sacrum  Consults: Hospitalist  Significant Diagnostic Studies: CT pelvis  Treatments: Acrylic sacral plasty  Discharge Exam: Blood pressure 151/74, pulse 57, temperature 98.2 F (36.8 C), temperature source Oral, resp. rate 19, height 5\' 2"  (1.575 m), weight 61.2 kg (134 lb 14.7 oz), SpO2 98.00%. Elderly debilitated individual with significant back pain at palpation in the lumbosacral region. Straight leg raising positive on the right side at 45. Motor strength good and iliopsoas quad tibialis anterior and gastroc.  Disposition: Skilled nursing facility, [facility] place  Discharge Orders   Future Appointments Provider Department Dept Phone   10/09/2012 9:45 AM [facility] [employer] [facility] Main Office [place]) [phone]   10/12/2012 10:30 AM Cassell Clement, MD [facility] Main Office [place]) [phone]   Future Orders Complete By Expires     Diet - low sodium heart healthy  As directed     Increase activity slowly  As directed         Medication List    TAKE these medications       alum & mag hydroxide-simeth 200-200-20 MG/5ML suspension  Commonly known as:  MAALOX/MYLANTA  Take 5 mLs by mouth as needed. For indigestion     aspirin 81 MG tablet  Take 81 mg by mouth daily.     atorvastatin 20 MG tablet  Commonly known as:  LIPITOR  Take 1 tablet (20 mg total) by mouth daily.     furosemide 20 MG tablet  Commonly known as:  LASIX  Take 1  tablet (20 mg total) by mouth daily as needed.     losartan 100 MG tablet  Commonly known as:  COZAAR  Take 1 tablet (100 mg total) by mouth daily.     meloxicam 7.5 MG tablet  Commonly known as:  MOBIC  Take 7.5 mg by mouth daily.     metoprolol succinate 25 MG 24 hr tablet  Commonly known as:  TOPROL-XL  Take 1 tablet (25 mg total) by mouth daily. Take with or immediately following a meal.     multivitamin with minerals Tabs  Take 1 tablet by mouth daily.     oxyCODONE-acetaminophen 5-325 MG per tablet  Commonly known as:  PERCOCET/ROXICET  Take 1 tablet by mouth every 8 (eight) hours as needed for pain.     oxyCODONE-acetaminophen 5-325 MG per tablet  Commonly known as:  PERCOCET/ROXICET  Take 1 tablet by mouth every 8 (eight) hours as needed for pain.     polyethylene glycol packet  Commonly known as:  MIRALAX / GLYCOLAX  Take 17 g by mouth as needed. For constipation     temazepam 15 MG capsule  Commonly known as:  RESTORIL  Take 1 capsule (15 mg total) by mouth at bedtime as needed. Insomnia     traMADol 50 MG tablet  Commonly known as:  ULTRAM  Take 50 mg by mouth every 6 (six) hours as needed for pain.     vitamin B-12 500 MCG tablet  Commonly known as:  CYANOCOBALAMIN  Take 500 mcg by mouth daily.  vitamin C 500 MG tablet  Commonly known as:  ASCORBIC ACID  Take 500 mg by mouth daily.     Vitamin D 1000 UNITS capsule  Take 1,000 Units by mouth daily.         SignedStefani Dama 09/15/2012, 8:08 AM

## 2012-09-15 NOTE — Progress Notes (Signed)
Patient left facility in wheelchair accompanied by staff and  Son, Pt alert and orientedx4 in no acute distress.Reports given to [facility] in [place] place.

## 2012-09-15 NOTE — Clinical Social Work Note (Signed)
Clinical Social Worker facilitated patient discharge including contacting patient family and facility to confirm patient discharge plans.  Clinical information faxed to facility and family agreeable with plan.  CSW arranged transport via family to [facility].  RN to call report prior to discharge.  Clinical Social Worker will sign off for now as social work intervention is no longer needed. Please consult us again if new need arises.  Jesse Dorene Bruni, LCSW [phone] 

## 2012-09-21 ENCOUNTER — Non-Acute Institutional Stay (SKILLED_NURSING_FACILITY): Payer: Medicare Other | Admitting: Adult Health

## 2012-09-21 DIAGNOSIS — R110 Nausea: Secondary | ICD-10-CM

## 2012-09-21 DIAGNOSIS — E7800 Pure hypercholesterolemia, unspecified: Secondary | ICD-10-CM

## 2012-09-21 DIAGNOSIS — S3210XD Unspecified fracture of sacrum, subsequent encounter for fracture with routine healing: Secondary | ICD-10-CM

## 2012-09-21 DIAGNOSIS — IMO0001 Reserved for inherently not codable concepts without codable children: Secondary | ICD-10-CM

## 2012-09-21 DIAGNOSIS — K5900 Constipation, unspecified: Secondary | ICD-10-CM

## 2012-09-21 DIAGNOSIS — M549 Dorsalgia, unspecified: Secondary | ICD-10-CM

## 2012-09-21 DIAGNOSIS — I10 Essential (primary) hypertension: Secondary | ICD-10-CM

## 2012-10-03 ENCOUNTER — Non-Acute Institutional Stay (SKILLED_NURSING_FACILITY): Payer: Medicare Other | Admitting: Adult Health

## 2012-10-03 DIAGNOSIS — I10 Essential (primary) hypertension: Secondary | ICD-10-CM

## 2012-10-05 ENCOUNTER — Non-Acute Institutional Stay (SKILLED_NURSING_FACILITY): Payer: Medicare Other | Admitting: Internal Medicine

## 2012-10-05 DIAGNOSIS — K5900 Constipation, unspecified: Secondary | ICD-10-CM

## 2012-10-05 DIAGNOSIS — E7800 Pure hypercholesterolemia, unspecified: Secondary | ICD-10-CM

## 2012-10-05 DIAGNOSIS — I10 Essential (primary) hypertension: Secondary | ICD-10-CM

## 2012-10-05 DIAGNOSIS — IMO0002 Reserved for concepts with insufficient information to code with codable children: Secondary | ICD-10-CM

## 2012-10-05 DIAGNOSIS — S3210XS Unspecified fracture of sacrum, sequela: Secondary | ICD-10-CM

## 2012-10-09 ENCOUNTER — Other Ambulatory Visit: Payer: Medicare Other

## 2012-10-12 ENCOUNTER — Ambulatory Visit: Payer: Medicare Other | Admitting: Cardiology

## 2012-10-16 ENCOUNTER — Telehealth: Payer: Self-pay | Admitting: Cardiology

## 2012-10-16 NOTE — Telephone Encounter (Signed)
New prob  Pt son is rechecking on some paperwork that he dropped off for her to go into a home.

## 2012-10-16 NOTE — Telephone Encounter (Signed)
I left message for patient's son that Glenda [name] is not in office today, that message will be forwarded to her for her to f/u when she returns and that they can call me back if this matter needs attention today.

## 2012-10-17 ENCOUNTER — Non-Acute Institutional Stay (SKILLED_NURSING_FACILITY): Payer: Medicare Other | Admitting: Adult Health

## 2012-10-17 DIAGNOSIS — R42 Dizziness and giddiness: Secondary | ICD-10-CM

## 2012-10-17 NOTE — Telephone Encounter (Signed)
Advised son forms will be ready tomorrow

## 2012-10-17 NOTE — Telephone Encounter (Signed)
Left message to call back  

## 2012-10-20 ENCOUNTER — Telehealth: Payer: Self-pay | Admitting: Cardiology

## 2012-10-20 ENCOUNTER — Non-Acute Institutional Stay (SKILLED_NURSING_FACILITY): Payer: Medicare Other | Admitting: Adult Health

## 2012-10-20 DIAGNOSIS — R001 Bradycardia, unspecified: Secondary | ICD-10-CM

## 2012-10-20 DIAGNOSIS — IMO0001 Reserved for inherently not codable concepts without codable children: Secondary | ICD-10-CM

## 2012-10-20 DIAGNOSIS — R42 Dizziness and giddiness: Secondary | ICD-10-CM

## 2012-10-20 DIAGNOSIS — S3210XD Unspecified fracture of sacrum, subsequent encounter for fracture with routine healing: Secondary | ICD-10-CM

## 2012-10-20 DIAGNOSIS — I10 Essential (primary) hypertension: Secondary | ICD-10-CM

## 2012-10-20 DIAGNOSIS — I498 Other specified cardiac arrhythmias: Secondary | ICD-10-CM

## 2012-10-20 DIAGNOSIS — E7800 Pure hypercholesterolemia, unspecified: Secondary | ICD-10-CM

## 2012-10-20 DIAGNOSIS — K5900 Constipation, unspecified: Secondary | ICD-10-CM

## 2012-10-20 DIAGNOSIS — M549 Dorsalgia, unspecified: Secondary | ICD-10-CM

## 2012-10-20 NOTE — Telephone Encounter (Signed)
I spoke with Ortencia Kick and he said Dr Lanier Clam completed paperwork earlier this week for the pt to go into a facility.  Per Ortencia Kick he received a copy of DNR order but this is not sufficient.  Ortencia Kick needs the original DNR order for the facility. I will contact medical records and if they cannot get the original then Dr Patty Sermons will need to complete a new form.

## 2012-10-20 NOTE — Telephone Encounter (Signed)
New Prob     Requesting pts original copy of her DNR form. Please call.

## 2012-10-23 NOTE — Telephone Encounter (Signed)
Left message on machine for Ortencia Kick to contact the office. DNR form signed by Dr Patty Sermons.

## 2012-10-24 NOTE — Telephone Encounter (Signed)
I spoke with Glenda [name] and made him aware that I will place DNR order at the front desk for pick-up.

## 2012-10-26 ENCOUNTER — Ambulatory Visit (INDEPENDENT_AMBULATORY_CARE_PROVIDER_SITE_OTHER): Payer: Medicare Other | Admitting: Cardiology

## 2012-10-26 DIAGNOSIS — I4949 Other premature depolarization: Secondary | ICD-10-CM

## 2012-10-26 DIAGNOSIS — I493 Ventricular premature depolarization: Secondary | ICD-10-CM

## 2012-10-26 DIAGNOSIS — I119 Hypertensive heart disease without heart failure: Secondary | ICD-10-CM

## 2012-10-26 DIAGNOSIS — E7800 Pure hypercholesterolemia, unspecified: Secondary | ICD-10-CM

## 2012-10-26 LAB — BASIC METABOLIC PANEL
BUN: 29 mg/dL — ABNORMAL HIGH (ref 6–23)
CO2: 27 mEq/L (ref 19–32)
Calcium: 9.6 mg/dL (ref 8.4–10.5)
Chloride: 98 mEq/L (ref 96–112)
Creatinine, Ser: 1 mg/dL (ref 0.4–1.2)
GFR: 55.25 mL/min — ABNORMAL LOW (ref >60.00)
Glucose, Bld: 78 mg/dL (ref 70–99)
Potassium: 4.1 mEq/L (ref 3.5–5.1)
Sodium: 134 mEq/L — ABNORMAL LOW (ref 135–145)

## 2012-10-26 NOTE — Progress Notes (Signed)
Quick Note:  Please report to patient. The recent labs are stable. Continue same medication and careful diet. ______ 

## 2012-10-27 ENCOUNTER — Telehealth: Payer: Self-pay | Admitting: Cardiology

## 2012-10-27 NOTE — Telephone Encounter (Signed)
Spoke with Elnita Maxwell with [facility] home [facility] who would like to make certain that Dr. Patty Sermons is going to approve orders for therapy and nursing. She states that family would like patient evaluated as soon as possible for increased agitation.   I advised Elnita Maxwell that I will send message to Dr. Patty Sermons for his approval.  Elnita Maxwell verbalized understanding and agreement with plan of care and states that messages may be left on her number: [phone] as it is a secure line.

## 2012-10-27 NOTE — Telephone Encounter (Signed)
No changes indicated.

## 2012-10-27 NOTE — Telephone Encounter (Signed)
I informed Elnita Maxwell of Dr. Yevonne Pax advice that no changes indicated - he will continue to sign orders for patient.  Elnita Maxwell verbalized gratitude.

## 2012-10-27 NOTE — Telephone Encounter (Signed)
New Prob     States pt is doing well, pt currently had spinal surgery. Going forward with PT 2 times a week for 6 weeks. Please call.

## 2012-10-27 NOTE — Telephone Encounter (Signed)
Spoke with Cornelius Moras, Physical Therapist at [facility] who states he has patient's orders for PT and will be working with patient for next 6 weeks.  States patient was discharged from rehab yesterday and moved into [facility]. Cornelius Moras states he expects patient to make a good recovery.  I reported patient's labs to [facility] per his request and advised that I would send message to Dr. Patty Sermons and we would call him with any changes.

## 2012-10-27 NOTE — Telephone Encounter (Signed)
New problem   Glenda [name] With [employer] returning a call.

## 2012-10-30 NOTE — Telephone Encounter (Signed)
Patient has appointment 11/01/12

## 2012-11-01 ENCOUNTER — Telehealth: Payer: Self-pay | Admitting: Cardiology

## 2012-11-01 ENCOUNTER — Ambulatory Visit (INDEPENDENT_AMBULATORY_CARE_PROVIDER_SITE_OTHER): Payer: Medicare Other | Admitting: Cardiology

## 2012-11-01 ENCOUNTER — Encounter: Payer: Self-pay | Admitting: Cardiology

## 2012-11-01 VITALS — BP 136/72 | HR 50 | Ht 62.0 in | Wt 117.1 lb

## 2012-11-01 DIAGNOSIS — M1990 Unspecified osteoarthritis, unspecified site: Secondary | ICD-10-CM

## 2012-11-01 DIAGNOSIS — I10 Essential (primary) hypertension: Secondary | ICD-10-CM

## 2012-11-01 DIAGNOSIS — I498 Other specified cardiac arrhythmias: Secondary | ICD-10-CM

## 2012-11-01 DIAGNOSIS — I5032 Chronic diastolic (congestive) heart failure: Secondary | ICD-10-CM

## 2012-11-01 DIAGNOSIS — M545 Low back pain, unspecified: Secondary | ICD-10-CM

## 2012-11-01 DIAGNOSIS — R001 Bradycardia, unspecified: Secondary | ICD-10-CM

## 2012-11-01 MED ORDER — METOPROLOL SUCCINATE ER 25 MG PO TB24: ORAL_TABLET | ORAL | Status: DC

## 2012-11-01 MED ORDER — FUROSEMIDE 20 MG PO TABS: 20.0000 mg | ORAL_TABLET | Freq: Every day | ORAL | Status: DC | PRN

## 2012-11-01 MED ORDER — METOPROLOL SUCCINATE ER 25 MG PO TB24: 25.0000 mg | ORAL_TABLET | ORAL | Status: DC

## 2012-11-01 MED ORDER — TRAMADOL HCL 50 MG PO TABS: 50.0000 mg | ORAL_TABLET | ORAL | Status: DC

## 2012-11-01 MED ORDER — TRAMADOL HCL 50 MG PO TABS: ORAL_TABLET | ORAL | Status: DC

## 2012-11-01 NOTE — Assessment & Plan Note (Signed)
Blood pressure has been stable on current therapy.  However her pulse is too slow.  We are going to decrease her metoprolol succinate 212.5 mg every other day

## 2012-11-01 NOTE — Telephone Encounter (Signed)
Will forward to  Dr. Brackbill for review 

## 2012-11-01 NOTE — Telephone Encounter (Signed)
Faxed as received, called to follow up and per Terdarryl was received

## 2012-11-01 NOTE — Telephone Encounter (Signed)
New Prob  Glenda [name] states that the orders that they got for the tramadol, metoprolol succinate and lasix needs doctors signatures on them for the pharmacy. He said you can fax them to [phone] or [phone]

## 2012-11-01 NOTE — Patient Instructions (Addendum)
DECREASE YOUR TOPROL (METOPROLOL) TO 12.5 MG EVERY OTHER DAY  INCREASE YOUR TRAMADOL TO 75 MG 4 TIMES A DAY AS NEEDED FOR PAIN   USE THE LASIX (FUROSEMIDE) 20 MG JUST AS NEEDED FOR SEVERE SWELLING  Your physician recommends that you schedule a follow-up appointment in: 3 month ov

## 2012-11-01 NOTE — Assessment & Plan Note (Signed)
The patient has a history of chronic diastolic heart failure.  She has very mild ankle edema which is probably related to her amlodipine.  We discussed how this type of edema does not have a adverse prognosis.  She has not been taking her Lasix on a regular basis.  We will continue to have it available for when necessary use if edema becomes worse.

## 2012-11-01 NOTE — Progress Notes (Signed)
Glenda [name] Date of Birth:  02-Aug-1924 [facility] [address] [place], [place]  [address] [phone]         Fax   [phone]  History of Present Illness: This pleasant 79 year old woman is seen for a followup office visit. . She has a history of atypical chest pain and a history of palpitations. She had a exercise treadmill test on 01/26/11 which showed no evidence of ischemia. She did have occasional unifocal PVCs during exercise and recovery but no ventricular tachycardia. He exercised for 4 minutes the test was stopped because of fatigue. Recently she has been feeling better. Her palpitations have improved. He has a history of valvular heart disease with an echocardiogram on 02/19/11 any evidence of mild aortic insufficiency and mild aortic stenosis and normal LV systolic function. She is not having any symptoms of CHF.  Since we last saw her she has had surgery for her back pain by Dr. Danielle Dess.  After staying in a rehabilitation facility she has now moved to an assisted living facility at [facility].  She is adjusting well to living at [facility] and the food is not bad and her appetite is improving.  She gets physical therapy twice a week.  She is a no CODE BLUE patient and her family brought in DO NOT RESUSCITATE forms for [name] to sign today.  Current Outpatient Prescriptions  Medication Sig Dispense Refill  . alum & mag hydroxide-simeth (MAALOX/MYLANTA) 200-200-20 MG/5ML suspension Take 5 mLs by mouth as needed. For indigestion      . amLODipine (NORVASC) 5 MG tablet       . aspirin 81 MG tablet Take 81 mg by mouth daily.      Marland Kitchen atorvastatin (LIPITOR) 20 MG tablet Take 1 tablet (20 mg total) by mouth daily.  90 tablet  3  . Cholecalciferol (VITAMIN D) 1000 UNITS capsule Take 1,000 Units by mouth daily.        Marland Kitchen losartan (COZAAR) 100 MG tablet Take 1 tablet (100 mg total) by mouth daily.  90 tablet  3  . meloxicam (MOBIC) 7.5 MG tablet Take 7.5 mg by mouth  daily.      . metoprolol succinate (TOPROL-XL) 25 MG 24 hr tablet 1/2 tablet every other day  10 tablet  11  . Multiple Vitamin (MULITIVITAMIN WITH MINERALS) TABS Take 1 tablet by mouth daily.      Marland Kitchen oxyCODONE-acetaminophen (PERCOCET/ROXICET) 5-325 MG per tablet Take 1 tablet by mouth every 8 (eight) hours as needed for pain.  6 tablet  0  . polyethylene glycol (MIRALAX / GLYCOLAX) packet Take 17 g by mouth as needed. For constipation      . temazepam (RESTORIL) 15 MG capsule Take 1 capsule (15 mg total) by mouth at bedtime as needed. Insomnia  30 capsule  5  . traMADol (ULTRAM) 50 MG tablet 1 and 1/2 tablets 4 times a day as needed for pain  180 tablet  1  . vitamin B-12 (CYANOCOBALAMIN) 500 MCG tablet Take 500 mcg by mouth daily.      . vitamin C (ASCORBIC ACID) 500 MG tablet Take 500 mg by mouth daily.      . furosemide (LASIX) 20 MG tablet Take 1 tablet (20 mg total) by mouth daily as needed. For severe swelling  30 tablet  11   No current facility-administered medications for this visit.    Allergies  Allergen Reactions  . Amitriptyline     hyper  . Avelox [  Moxifloxacin Hcl In Nacl] Other (See Comments)    dizziness  . Halcion [Triazolam]   . Pentazocine Lactate   . Sulfa Drugs Cross Reactors   . Trazodone And Nefazodone     Not effective  . Penicillins Rash    Patient Active Problem List   Diagnosis Date Noted  . Insomnia 09/15/2010    Priority: High  . Benign hypertensive heart disease without heart failure 09/15/2010    Priority: High  . Accelerated/malignant hypertension 09/14/2012  . Peripheral edema 07/11/2012  . Aortic stenosis, mild 07/11/2012  . Chronic diastolic HF (heart failure) 07/11/2012  . Osteoarthritis 04/03/2012  . Heart murmur, aortic 02/29/2012  . Anemia 06/10/2011  . Epistaxis 06/10/2011  . Hypertension 03/25/2011  . PVC (premature ventricular contraction) 02/16/2011  . Dizziness 12/01/2010  . Hypercholesterolemia 09/15/2010    History    Smoking status  . Never Smoker   Smokeless tobacco  . Not on file    History  Alcohol Use No    Family History  Problem Relation Age of Onset  . Heart disease Mother   . Hypertension Father   . Heart disease Sister     Review of Systems: Constitutional: no fever chills diaphoresis or fatigue or change in weight.  Head and neck: no hearing loss, no epistaxis, no photophobia or visual disturbance. Respiratory: No cough, shortness of breath or wheezing. Cardiovascular: No chest pain peripheral edema, palpitations. Gastrointestinal: No abdominal distention, no abdominal pain, no change in bowel habits hematochezia or melena. Genitourinary: No dysuria, no frequency, no urgency, no nocturia. Musculoskeletal:No arthralgias, no back pain, no gait disturbance or myalgias. Neurological: No dizziness, no headaches, no numbness, no seizures, no syncope, no weakness, no tremors. Hematologic: No lymphadenopathy, no easy bruising. Psychiatric: No confusion, no hallucinations, no sleep disturbance.    Physical Exam: Filed Vitals:   11/01/12 1434  BP: 136/72  Pulse: 50   the general appearance reveals a well-developed well-nourished elderly woman in no distress.  Her son is with her today.The head and neck exam reveals pupils equal and reactive.  Extraocular movements are full.  There is no scleral icterus.  The mouth and pharynx are normal.  The neck is supple.  The carotids reveal no bruits.  The jugular venous pressure is normal.  The  thyroid is not enlarged.  There is no lymphadenopathy.  The chest is clear to percussion and auscultation.  There are no rales or rhonchi.  Expansion of the chest is symmetrical.  The precordium is quiet.  The first heart sound is normal.  The second heart sound is physiologically split.  There is no murmur gallop rub or click.  There is no abnormal lift or heave.  The abdomen is soft and nontender.  The bowel sounds are normal.  The liver and spleen are not  enlarged.  There are no abdominal masses.  There are no abdominal bruits.  Extremities reveal good pedal pulses.  There is very mild ankle edema.  There is no cyanosis or clubbing.  Strength is normal and symmetrical in all extremities.  There is no lateralizing weakness.  There are no sensory deficits.  The skin is warm and dry.  There is no rash.  EKG today shows sinus bradycardia at 50 per minute.  Since last tracing of 09/11/12, the rate is slower   Assessment / Plan: Continue same medication except decrease Toprol to 12.5 mg every other day and increased pain medication to tramadol 75 mg 4 times a day. Recheck in  3 months for office visit.

## 2012-11-01 NOTE — Telephone Encounter (Signed)
New Prob  Joleen the occupational therapist at [facility] wanted to let the doctor know that she has declined services. She states that pt gets around very well.

## 2012-11-01 NOTE — Assessment & Plan Note (Signed)
Despite her successful back surgery, the patient still has severe back pain whenever she is not sitting or lying down.  The tramadol helps but does not carry her long enough after taking it.  She has been on 50 mg every 6 hours and we will increase this to 75 mg 4 times a day.

## 2012-11-06 DIAGNOSIS — S3210XA Unspecified fracture of sacrum, initial encounter for closed fracture: Secondary | ICD-10-CM

## 2012-11-06 DIAGNOSIS — K5900 Constipation, unspecified: Secondary | ICD-10-CM | POA: Insufficient documentation

## 2012-11-06 HISTORY — DX: Unspecified fracture of sacrum, initial encounter for closed fracture: S32.10XA

## 2012-11-06 NOTE — Progress Notes (Signed)
Patient ID: Glenda [name], female   DOB: May 05, 1924, 79 y.o.   MRN: [id number]        HISTORY & PHYSICAL  DATE: 10/05/2012   FACILITY: [facility]  LEVEL OF CARE: SNF (31)  ALLERGIES:  Allergies  Allergen Reactions  . Amitriptyline     hyper  . Avelox [Moxifloxacin Hcl In Nacl] Other (See Comments)    dizziness  . Halcion [Triazolam]   . Pentazocine Lactate   . Sulfa Drugs Cross Reactors   . Trazodone And Nefazodone     Not effective  . Penicillins Rash    CHIEF COMPLAINT:  Manage sacral fracture, hypertension, and hyperlipidemia.    HISTORY OF PRESENT ILLNESS:  The patient is an 79 year-old, Caucasian female.    SACRAL FRACTURE:  The patient has sustained a sacral insufficiency fracture.  Therefore, she underwent a sacroplasty and tolerated the procedure well.  She is admitted to this facility for short-term rehabilitation.   She denies sacral pain currently.    HTN: Pt 's HTN remains stable.  Denies CP, sob, DOE, pedal edema, headaches, dizziness or visual disturbances.  No complications from the medications currently being used.  Last BP :  134/74, 123/59, 140/67.    HYPERLIPIDEMIA: No complications from the medications presently being used. Last fasting lipid panel showed :  Not available.    PAST MEDICAL HISTORY :  Past Medical History  Diagnosis Date  . Hypertension   . Peripheral edema   . PVCs (premature ventricular contractions)   . TIA (transient ischemic attack)   . Hiatal hernia   . Diverticulosis   . OA (osteoarthritis) of knee   . Hypercholesterolemia   . Insomnia   . DDD (degenerative disc disease), lumbar     severe facet dz and adv DDD MRI L spine 2009  . Aortic stenosis     Echo 02/2011 showing mild AS with normal LV systolic function    PAST SURGICAL HISTORY: Past Surgical History  Procedure Laterality Date  . Cardiac catheterization  02/17/2001    MILD REGURGITATION. EF 60%  . Colonoscopy    . Cesarean section    .  Hemorroidectomy    . Breast biopsy      LEFT BREAST  . Kyphoplasty Right 09/11/2012    Procedure: Right Acrylic Sacroplasty;  Surgeon: Barnett Abu, MD;  Location: [facility];  Service: Neurosurgery;  Laterality: Right;  Right  Acrylic Sacroplasty    SOCIAL HISTORY:  reports that she has never smoked. She does not have any smokeless tobacco history on file. She reports that she does not drink alcohol or use illicit drugs.  FAMILY HISTORY:  Family History  Problem Relation Age of Onset  . Heart disease Mother   . Hypertension Father   . Heart disease Sister     CURRENT MEDICATIONS: Reviewed per [facility]  REVIEW OF SYSTEMS:  See HPI otherwise 14 point ROS is negative.  PHYSICAL EXAMINATION  VS:  T 97.8       P 60      RR 16      BP 134/74      POX 97% room air        WT (Lb)  GENERAL: no acute distress, normal body habitus SKIN: warm & dry, no suspicious lesions or rashes, no excessive dryness EYES: conjunctivae normal, sclerae normal, normal eye lids MOUTH/THROAT: lips without lesions,no lesions in the mouth,tongue is without lesions,uvula elevates in midline NECK: supple, trachea midline, no neck  masses, no thyroid tenderness, no thyromegaly LYMPHATICS: no LAN in the neck, no supraclavicular LAN RESPIRATORY: breathing is even & unlabored, BS CTAB CARDIAC: RRR, no murmur,no extra heart sounds, no edema GI:  ABDOMEN: abdomen soft, normal BS, no masses, no tenderness  LIVER/SPLEEN: no hepatomegaly, no splenomegaly MUSCULOSKELETAL: HEAD: normal to inspection & palpation BACK: no kyphosis, scoliosis or spinal processes tenderness EXTREMITIES: LEFT UPPER EXTREMITY: full range of motion, normal strength & tone RIGHT UPPER EXTREMITY:  full range of motion, normal strength & tone LEFT LOWER EXTREMITY: strength intact, range of motion moderate  RIGHT LOWER EXTREMITY: strength intact, range of motion moderate  PSYCHIATRIC: the patient is alert & oriented to person, affect & behavior  appropriate  LABS/RADIOLOGY: CBC normal.    BMP normal.    ASSESSMENT/PLAN:  Sacral fracture.  Status post sacroplasty.  Continue rehabilitation.    Hypertension.  Blood pressures have been variable.  Norvasc was started.    Hyperlipidemia.  Continue Lipitor.    Constipation.  Continue MiraLAX.    Insomnia.  Continue Restoril.    Check CBC and BMP.    I have reviewed patient's medical records received at admission/from hospitalization.  CPT CODE: 16109

## 2012-11-19 ENCOUNTER — Encounter: Payer: Self-pay | Admitting: Adult Health

## 2012-11-19 DIAGNOSIS — R42 Dizziness and giddiness: Secondary | ICD-10-CM

## 2012-11-19 DIAGNOSIS — R110 Nausea: Secondary | ICD-10-CM | POA: Insufficient documentation

## 2012-11-19 HISTORY — DX: Dizziness and giddiness: R42

## 2012-11-19 NOTE — Progress Notes (Signed)
Patient ID: Glenda [name], female   DOB: 04/28/1924, 79 y.o.   MRN: [id number]       PROGRESS NOTE  DATE: 10/20/2012   FACILITY: [facility]  LEVEL OF CARE: SNF (31)   CHIEF COMPLAINT:  Discharge Visit  HISTORY OF PRESENT ILLNESS:  This is an 79 year old female who is for discharge to an Assisted Living Facility with Home health PT and OT. DME: Rolling walker due to unsteady gait. Noted HR 58, 59, 55, 48. She complains of occasional vertigo. No shortness of breath nor fainting episode. She was admitted to [facility] on 09/15/12 from [facility] with sacral fracture status post acrylic sacral plasty. Patient was admitted to this facility for short-term rehabilitation after the patient's recent hospitalization.  Patient has completed SNF rehabilitation and therapy has cleared the patient for discharge.  Reassessment of ongoing problem(s):  HTN: Pt 's HTN remains stable.  Denies CP, sob, DOE, pedal edema, headaches, dizziness or visual disturbances.  No complications from the medications currently being used.  Last BP :136/74  CONSTIPATION: The constipation remains stable. No complications from the medications presently being used. Patient denies ongoing constipation, abdominal pain, nausea or vomiting.  HYPERLIPIDEMIA: No complications from the medications presently being used.   PAST MEDICAL HISTORY : Reviewed.  No changes.  CURRENT MEDICATIONS: Reviewed per [facility]  REVIEW OF SYSTEMS:  GENERAL: no change in appetite, no fatigue, no weight changes, no fever, chills or weakness RESPIRATORY: no cough, SOB, DOE, wheezing, hemoptysis CARDIAC: no chest pain, edema or palpitations GI: no abdominal pain, diarrhea, constipation, heart burn, nausea or vomiting  PHYSICAL EXAMINATION  VS:  T 96.8     P 58      RR 14      BP 136/74      POX 98 %       W 118.6  (Lb)  GENERAL: no acute distress, normal body habitus EYES: conjunctivae normal, sclerae normal, normal eye  lids NECK: supple, trachea midline, no neck masses, no thyroid tenderness, no thyromegaly LYMPHATICS: no LAN in the neck, no supraclavicular LAN RESPIRATORY: breathing is even & unlabored, BS CTAB CARDIAC: RRR, no murmur,no extra heart sounds, no edema GI: abdomen soft, normal BS, no masses, no tenderness, no hepatomegaly, no splenomegaly PSYCHIATRIC: the patient is alert & oriented to person, affect & behavior appropriate  LABS/RADIOLOGY: 09/11/12 WBC 9.3 hemoglobin 13.6 hematocrit 38.4 sodium 134 potassium 4.8 glucose 104 BUN 24 creatinine 1.02 calcium 9.0   ASSESSMENT/PLAN:  Sacral fracture status post acrylic sacral plasty - for home health PT and OT  Hypertension - Toprol XL dose age decreased due to bradycardia; increase Norvasc to 10 mg by mouth daily  Bradycardia - decrease Toprol to 12.5 mg XL by mouth every morning  Hyperlipidemia - continue Lipitor  Back pain - well controlled  Constipation - no complaints  Vertigo - stable  I have filled out patient's discharge paperwork and written prescriptions.  Patient will receive home health PT and OT.    DME provided: Rolling walker  Total discharge time: Greater than 30 minutes Discharge time involved coordination of the discharge process with Child psychotherapist, nursing staff and therapy department. Medical justification for home health services/DME verified.  CPT CODE: 40981

## 2012-11-19 NOTE — Progress Notes (Signed)
Patient ID: Glenda [name], female   DOB: [birth year], 79 y.o.   MRN: [id number]       PROGRESS NOTE  DATE: 09/21/2012  FACILITY:  [facility]  LEVEL OF CARE: SNF (31)  Acute Visit  CHIEF COMPLAINT:  Follow-up hospitalization  HISTORY OF PRESENT ILLNESS: This is an 79 year old female who has been admitted to [facility] on 09/15/12 from [facility] with sacral fracture status post acrylic sacral plasty. She has been admitted for a short-term rehabilitation. Patient complains of occasional nausea and is requesting for routine pain medication for her back.  PAST MEDICAL HISTORY : Reviewed.  No changes.  CURRENT MEDICATIONS: Reviewed per [facility]  REVIEW OF SYSTEMS:  GENERAL: no change in appetite, no fatigue, no weight changes, no fever, chills or weakness RESPIRATORY: no cough, SOB, DOE,, wheezing, hemoptysis CARDIAC: no chest pain, edema or palpitations GI: no abdominal pain, diarrhea, constipation, heart burn, nausea or vomiting  PHYSICAL EXAMINATION  VS:  T 97.3        P 53       RR 20       BP 124/68      POX 95 %       WT 118 (Lb)  GENERAL: no acute distress, normal body habitus EYES: conjunctivae normal, sclerae normal, normal eye lids NECK: supple, trachea midline, no neck masses, no thyroid tenderness, no thyromegaly LYMPHATICS: no LAN in the neck, no supraclavicular LAN RESPIRATORY: breathing is even & unlabored, BS CTAB CARDIAC: RRR, no murmur,no extra heart sounds, no edema GI: abdomen soft, normal BS, no masses, no tenderness, no hepatomegaly, no splenomegaly PSYCHIATRIC: the patient is alert & oriented to person, affect & behavior appropriate  LABS/RADIOLOGY: 09/11/12 WBC 9.3 hemoglobin 13.6 hematocrit 38.4 sodium 134 potassium 4.8 glucose 104 BUN 24 creatinine 1.02 calcium 9.0  ASSESSMENT/PLAN:  Sacral fracture status post acrylic sacral plasty - for PT and OT  Hypertension - well-controlled; BP/heart rate every shift x1  week  Hyperlipidemia - continue Lipitor   Back pain - start tramadol 50 mg one tablet by mouth q. 8 hours  Nausea - start Zofran 4 mg one tablet by mouth every 8 hours when necessary  Constipation - start MiraLax 17 g last 6-8 ounces liquid by mouth daily and Colace 100 mg by mouth twice a day   CPT CODE: 78295

## 2012-11-19 NOTE — Progress Notes (Signed)
Patient ID: Glenda [name], female   DOB: 10-Dec-1924, 79 y.o.   MRN: [id number]       PROGRESS NOTE  DATE: 10/17/2012  FACILITY:  [facility]  LEVEL OF CARE: SNF (31)  Acute Visit  CHIEF COMPLAINT:  Manage Vertigo  HISTORY OF PRESENT ILLNESS: This is an 79 year old female who complains of dizziness. She was noted lying in bed and verbalized that she is scared to get up. She said that she had dizziness before and used to take medicine for it. No complaints of headache nor weakness.  PAST MEDICAL HISTORY : Reviewed.  No changes.  CURRENT MEDICATIONS: Reviewed per [facility]  REVIEW OF SYSTEMS:  GENERAL: no change in appetite, no fatigue, no weight changes, no fever, chills or weakness RESPIRATORY: no cough, SOB, DOE,, wheezing, hemoptysis CARDIAC: no chest pain, edema or palpitations GI: no abdominal pain, diarrhea, constipation, heart burn, nausea or vomiting  PHYSICAL EXAMINATION  VS:  T97.9        P54       RR14     BP130/73      POX %       WT (Lb)  GENERAL: no acute distress, normal body habitus EYES: conjunctivae normal, sclerae normal, normal eye lids NECK: supple, trachea midline, no neck masses, no thyroid tenderness, no thyromegaly RESPIRATORY: breathing is even & unlabored, BS CTAB CARDIAC: RRR, no murmur,no extra heart sounds, no edema GI: abdomen soft, normal BS, no masses, no tenderness, no hepatomegaly, no splenomegaly PSYCHIATRIC: the patient is alert & oriented to person, affect & behavior appropriate  LABS/RADIOLOGY: 09/11/12 WBC 9.3 hemoglobin 13.6 hematocrit 38.4 sodium 134 potassium 4.8 glucose 104 BUN 24 creatinine 1.02 calcium 9.0   ASSESSMENT/PLAN:  Vertigo - start Antivert 25 mg 1 tab PO Q 8 hours PRN  CPT CODE: 40981

## 2012-11-19 NOTE — Progress Notes (Signed)
Patient ID: Glenda [name], female   DOB: [birth year], 80 y.o.   MRN: [id number]       PROGRESS NOTE  DATE: 10/03/2012  FACILITY:  [facility]  LEVEL OF CARE: SNF (31)  Acute Visit  CHIEF COMPLAINT:  Manage of Hypertension  HISTORY OF PRESENT ILLNESS: This is an 79 year old female who was noted to have elevated blood pressure = 184/75, 171/84, 176/71. No complaints of dizziness nor headache. No chest pain or shortness of breath.   PAST MEDICAL HISTORY : Reviewed.  No changes.  CURRENT MEDICATIONS: Reviewed per [facility]  REVIEW OF SYSTEMS:  GENERAL: no change in appetite, no fatigue, no weight changes, no fever, chills or weakness RESPIRATORY: no cough, SOB, DOE,, wheezing, hemoptysis CARDIAC: no chest pain, edema or palpitations GI: no abdominal pain, diarrhea, constipation, heart burn, nausea or vomiting  PHYSICAL EXAMINATION  VS:  T 97.1        P 50       RR 18       B 184/75 P      POX 99 %       WT 117.2 (Lb)  GENERAL: no acute distress, normal body habitus NECK: supple, trachea midline, no neck masses, no thyroid tenderness, no thyromegaly LYMPHATICS: no LAN in the neck, no supraclavicular LAN RESPIRATORY: breathing is even & unlabored, BS CTAB CARDIAC: RRR, no murmur,no extra heart sounds, no edema GI: abdomen soft, normal BS, no masses, no tenderness, no hepatomegaly, no splenomegaly PSYCHIATRIC: the patient is alert & oriented to person, affect & behavior appropriate  LABS/RADIOLOGY: 09/11/12 WBC 9.3 hemoglobin 13.6 hematocrit 38.4 sodium 134 potassium 4.8 glucose 104 BUN 24 creatinine 1.02 calcium 9.0  ASSESSMENT/PLAN:  Hypertension - start Norvasc 5 mg one tablet by mouth daily; BP and heart rate every shift x1 week   CPT CODE: 40981

## 2012-12-01 ENCOUNTER — Other Ambulatory Visit: Payer: Self-pay | Admitting: *Deleted

## 2012-12-01 MED ORDER — TEMAZEPAM 15 MG PO CAPS: ORAL_CAPSULE | ORAL | Status: DC

## 2012-12-04 ENCOUNTER — Other Ambulatory Visit: Payer: Self-pay | Admitting: *Deleted

## 2012-12-05 ENCOUNTER — Other Ambulatory Visit: Payer: Self-pay | Admitting: *Deleted

## 2012-12-06 ENCOUNTER — Other Ambulatory Visit: Payer: Self-pay

## 2012-12-06 ENCOUNTER — Telehealth: Payer: Self-pay | Admitting: Cardiology

## 2012-12-06 MED ORDER — VITAMIN B-12 500 MCG PO TABS: 500.0000 ug | ORAL_TABLET | Freq: Every day | ORAL | Status: DC

## 2012-12-06 MED ORDER — LOSARTAN POTASSIUM 100 MG PO TABS: 100.0000 mg | ORAL_TABLET | Freq: Every day | ORAL | Status: DC

## 2012-12-06 NOTE — Telephone Encounter (Signed)
New problem  Kutrice/[facility] is calling about face to face that was faxed on 11/01/12, 11/29/12. Please call Kutrice

## 2012-12-07 ENCOUNTER — Other Ambulatory Visit: Payer: Self-pay | Admitting: *Deleted

## 2012-12-07 MED ORDER — VITAMIN D 1000 UNITS PO CAPS: 1000.0000 [IU] | ORAL_CAPSULE | Freq: Every day | ORAL | Status: DC

## 2012-12-07 NOTE — Telephone Encounter (Signed)
Advised received pending completion by  Dr. Patty Sermons

## 2012-12-12 ENCOUNTER — Telehealth: Payer: Self-pay | Admitting: Cardiology

## 2012-12-12 NOTE — Telephone Encounter (Signed)
New problem   FYI  Patient is out rehab . Had spinal surgery working with her at home.

## 2012-12-13 ENCOUNTER — Other Ambulatory Visit: Payer: Self-pay | Admitting: *Deleted

## 2012-12-13 MED ORDER — ATORVASTATIN CALCIUM 20 MG PO TABS: 20.0000 mg | ORAL_TABLET | Freq: Every day | ORAL | Status: DC

## 2012-12-13 NOTE — Telephone Encounter (Signed)
Will forward to  Dr. Brackbill so he will be aware 

## 2012-12-14 ENCOUNTER — Telehealth: Payer: Self-pay | Admitting: Cardiology

## 2012-12-14 NOTE — Telephone Encounter (Signed)
Checking on face-to-face, will call us back if not received  Dr. Patty Sermons has completed

## 2012-12-14 NOTE — Telephone Encounter (Signed)
Called [facility] and confirmed receiving

## 2012-12-14 NOTE — Telephone Encounter (Signed)
New problem   Need to speak to nurse concerning pt's condition and need additional orders for pt to continue PT. Please call.

## 2012-12-19 ENCOUNTER — Telehealth: Payer: Self-pay | Admitting: Cardiology

## 2012-12-19 NOTE — Telephone Encounter (Signed)
New Problem:  Home Health wants to know the status of a face to face encounter form. Please call Kutrice from [facility] back.

## 2012-12-19 NOTE — Telephone Encounter (Signed)
Walk In pt Form " [facility]" paperwork Dropped Off will Hold for Glenda [name] to come Back in Office 12/19/12/KM

## 2012-12-19 NOTE — Telephone Encounter (Signed)
Kutrice from [facility] regarding the face to face encounter. She is Aware that Juliette Alcide will be in tomorrow and will send this message to her desktop.

## 2012-12-20 NOTE — Telephone Encounter (Signed)
Spoke with Kutrice and form received

## 2012-12-21 ENCOUNTER — Telehealth: Payer: Self-pay | Admitting: Cardiology

## 2012-12-21 NOTE — Telephone Encounter (Signed)
Noted  

## 2012-12-21 NOTE — Telephone Encounter (Signed)
New problem     FYI  Patient was re certify  will be sending order over

## 2012-12-22 ENCOUNTER — Telehealth: Payer: Self-pay | Admitting: Cardiology

## 2012-12-22 NOTE — Telephone Encounter (Signed)
Reviewed by Dawayne Patricia NP and will Rx Cipro 500 mg bid x 5 days  Rx faxed to [facility]

## 2012-12-22 NOTE — Telephone Encounter (Signed)
New problem    UA was fax over. Patient is complaining of pain. Please advise

## 2013-01-31 ENCOUNTER — Ambulatory Visit: Payer: Medicare Other | Admitting: Cardiology

## 2013-02-16 ENCOUNTER — Ambulatory Visit: Payer: Medicare Other | Admitting: Cardiology

## 2013-02-20 ENCOUNTER — Telehealth: Payer: Self-pay | Admitting: Cardiology

## 2013-02-20 NOTE — Telephone Encounter (Signed)
New message    Mom has an appt fri--want to talk to the nurse about the appt

## 2013-02-20 NOTE — Telephone Encounter (Signed)
Spoke with son. He is concerned with his mothers driving and would like  Dr. Patty Sermons to discuss with her at visit Friday.

## 2013-02-20 NOTE — Telephone Encounter (Signed)
Son's request noted

## 2013-02-23 ENCOUNTER — Encounter: Payer: Self-pay | Admitting: Cardiology

## 2013-02-23 ENCOUNTER — Ambulatory Visit (INDEPENDENT_AMBULATORY_CARE_PROVIDER_SITE_OTHER): Payer: Medicare Other | Admitting: Cardiology

## 2013-02-23 VITALS — BP 118/76 | HR 51 | Ht 62.0 in | Wt 123.0 lb

## 2013-02-23 DIAGNOSIS — I4949 Other premature depolarization: Secondary | ICD-10-CM

## 2013-02-23 DIAGNOSIS — I359 Nonrheumatic aortic valve disorder, unspecified: Secondary | ICD-10-CM

## 2013-02-23 DIAGNOSIS — I493 Ventricular premature depolarization: Secondary | ICD-10-CM

## 2013-02-23 DIAGNOSIS — I10 Essential (primary) hypertension: Secondary | ICD-10-CM

## 2013-02-23 DIAGNOSIS — I119 Hypertensive heart disease without heart failure: Secondary | ICD-10-CM

## 2013-02-23 DIAGNOSIS — I350 Nonrheumatic aortic (valve) stenosis: Secondary | ICD-10-CM

## 2013-02-23 DIAGNOSIS — I5032 Chronic diastolic (congestive) heart failure: Secondary | ICD-10-CM

## 2013-02-23 LAB — BASIC METABOLIC PANEL
BUN: 22 mg/dL (ref 6–23)
CO2: 29 mEq/L (ref 19–32)
Calcium: 9.3 mg/dL (ref 8.4–10.5)
Chloride: 98 mEq/L (ref 96–112)
Creatinine, Ser: 1 mg/dL (ref 0.4–1.2)
GFR: 56.5 mL/min — ABNORMAL LOW (ref >60.00)
Glucose, Bld: 105 mg/dL — ABNORMAL HIGH (ref 70–99)
Potassium: 4.2 mEq/L (ref 3.5–5.1)
Sodium: 133 mEq/L — ABNORMAL LOW (ref 135–145)

## 2013-02-23 NOTE — Assessment & Plan Note (Signed)
Her chronic PVCs have not been a problem recently.  She attributes the improvement to the fact that she is sleeping better and getting more rest and is less stressed out.  She does well on temazepam 15 mg at bedtime

## 2013-02-23 NOTE — Progress Notes (Signed)
Glenda [name] Date of Birth:  12-04-1924 [address] [place], [place]  [address] [phone]         Fax   [phone]  History of Present Illness: This pleasant 79 year old woman is seen for a followup office visit. . She has a history of atypical chest pain and a history of palpitations. She had a exercise treadmill test on 01/26/11 which showed no evidence of ischemia. She did have occasional unifocal PVCs during exercise and recovery but no ventricular tachycardia. He exercised for 4 minutes the test was stopped because of fatigue. Recently she has been feeling better. Her palpitations have improved. He has a history of valvular heart disease with an echocardiogram on 02/19/11 any evidence of mild aortic insufficiency and mild aortic stenosis and normal LV systolic function. She is not having any symptoms of CHF.  She has had successful back surgery by Dr. Danielle Dess.  She has finished the physical therapy rehabilitation and she now resides at [facility] assisted living facility.  She is doing well there.  The food is satisfactory.  She walks with a cane.  She is a no CODE BLUE patient and her family brought in DO NOT RESUSCITATE forms for [name] to sign at her last visit.  Current Outpatient Prescriptions  Medication Sig Dispense Refill  . amLODipine (NORVASC) 2.5 MG tablet Take 2.5 mg by mouth daily.      Marland Kitchen aspirin 81 MG tablet Take 81 mg by mouth daily.      Marland Kitchen atorvastatin (LIPITOR) 20 MG tablet Take 1 tablet (20 mg total) by mouth daily.  90 tablet  3  . Cholecalciferol (VITAMIN D) 1000 UNITS capsule Take 1,000 Units by mouth daily. VITAMIN D#3      . docusate sodium (COLACE) 100 MG capsule Take 100 mg by mouth 2 (two) times daily.      . furosemide (LASIX) 20 MG tablet Take 1 tablet (20 mg total) by mouth daily as needed. For severe swelling  30 tablet  11  . guaiFENesin (MUCINEX) 600 MG 12 hr tablet Take by mouth. As needed      . losartan (COZAAR) 100 MG tablet Take 1  tablet (100 mg total) by mouth daily.  90 tablet  3  . meclizine (ANTIVERT) 25 MG tablet Take 25 mg by mouth 3 (three) times daily as needed for dizziness.      . metoprolol succinate (TOPROL-XL) 25 MG 24 hr tablet 1/2 tablet every other day  10 tablet  11  . Multiple Vitamins-Minerals (CEROVITE PO) Take by mouth. 1  tab daily      . ondansetron (ZOFRAN) 4 MG tablet Take 4 mg by mouth every 8 (eight) hours as needed for nausea or vomiting.      . polyethylene glycol (MIRALAX / GLYCOLAX) packet Take 17 g by mouth as needed. For constipation      . temazepam (RESTORIL) 15 MG capsule Take 1 capsule (15 mg total) by mouth at bedtime as needed. Insomnia  30 capsule  5  . temazepam (RESTORIL) 15 MG capsule Take one tablet by mouth at bedtime as needed for insomnia  30 capsule  5  . traMADol (ULTRAM) 50 MG tablet 1 and 1/2 tablets 4 times a day as needed for pain  180 tablet  1  . oxyCODONE-acetaminophen (PERCOCET/ROXICET) 5-325 MG per tablet Take 1 tablet by mouth every 8 (eight) hours as needed for pain.  6 tablet  0   No current facility-administered medications  for this visit.    Allergies  Allergen Reactions  . Amitriptyline     hyper  . Avelox [Moxifloxacin Hcl In Nacl] Other (See Comments)    dizziness  . Halcion [Triazolam]   . Pentazocine Lactate   . Sulfa Drugs Cross Reactors   . Trazodone And Nefazodone     Not effective  . Penicillins Rash    Patient Active Problem List   Diagnosis Date Noted  . Insomnia 09/15/2010    Priority: High  . Benign hypertensive heart disease without heart failure 09/15/2010    Priority: High  . Nausea 11/19/2012  . Vertigo 11/19/2012  . Unspecified constipation 11/06/2012  . Sacral fracture 11/06/2012  . Accelerated/malignant hypertension 09/14/2012  . Peripheral edema 07/11/2012  . Aortic stenosis, mild 07/11/2012  . Chronic diastolic HF (heart failure) 07/11/2012  . Osteoarthritis 04/03/2012  . Heart murmur, aortic 02/29/2012  . Anemia  06/10/2011  . Epistaxis 06/10/2011  . Hypertension 03/25/2011  . PVC (premature ventricular contraction) 02/16/2011  . Dizziness 12/01/2010  . Hypercholesterolemia 09/15/2010    History  Smoking status  . Never Smoker   Smokeless tobacco  . Not on file    History  Alcohol Use No    Family History  Problem Relation Age of Onset  . Heart disease Mother   . Hypertension Father   . Heart disease Sister     Review of Systems: Constitutional: no fever chills diaphoresis or fatigue or change in weight.  Head and neck: no hearing loss, no epistaxis, no photophobia or visual disturbance. Respiratory: No cough, shortness of breath or wheezing. Cardiovascular: No chest pain peripheral edema, palpitations. Gastrointestinal: No abdominal distention, no abdominal pain, no change in bowel habits hematochezia or melena. Genitourinary: No dysuria, no frequency, no urgency, no nocturia. Musculoskeletal:No arthralgias, no back pain, no gait disturbance or myalgias. Neurological: No dizziness, no headaches, no numbness, no seizures, no syncope, no weakness, no tremors. Hematologic: No lymphadenopathy, no easy bruising. Psychiatric: No confusion, no hallucinations, no sleep disturbance.    Physical Exam: Filed Vitals:   02/23/13 1454  BP: 118/76  Pulse:    the general appearance reveals a well-developed well-nourished elderly woman in no distress.  Her son is with her today.The head and neck exam reveals pupils equal and reactive.  Extraocular movements are full.  There is no scleral icterus.  The mouth and pharynx are normal.  The neck is supple.  The carotids reveal no bruits.  The jugular venous pressure is normal.  The  thyroid is not enlarged.  There is no lymphadenopathy.  The chest is clear to percussion and auscultation.  There are no rales or rhonchi.  Expansion of the chest is symmetrical.  The precordium is quiet.  The first heart sound is normal.  The second heart sound is  physiologically split.  There is no gallop rub or click.  There is a grade 2/6 systolic ejection murmur at the aortic area.  There is no abnormal lift or heave.  The abdomen is soft and nontender.  The bowel sounds are normal.  The liver and spleen are not enlarged.  There are no abdominal masses.  There are no abdominal bruits.  Extremities reveal good pedal pulses.  There is very mild ankle edema.  There is no cyanosis or clubbing.  Strength is normal and symmetrical in all extremities.  There is no lateralizing weakness.  There are no sensory deficits.  The skin is warm and dry.  There is no rash.  Assessment / Plan: Continue same medication.  We are stopping vitamin B12 and vitamin C and she will continue her multivitamin care checking a basal metabolic panel today to check on her potassium.  Recheck in 4 months for followup office visit CBC lipid panel hepatic function panel and basal metabolic panel fasting. She asked me of the foot she should be driving a car and I advised her against driving.  I am concerned that her reflexes would be to slow.  Her son concurs with this recommendation. The labs today pending

## 2013-02-23 NOTE — Patient Instructions (Signed)
Will obtain labs today and call you with the results (bmet)  STOP VITAMIN B 12 AND VITAMIN C, continue same dose of other medications  Your physician wants you to follow-up in: 4 months with fasting labs (lp/bmet/hfp/cbc)  You will receive a reminder letter in the mail two months in advance. If you don't receive a letter, please call our office to schedule the follow-up appointment.

## 2013-02-23 NOTE — Assessment & Plan Note (Signed)
Shows a known heart murmur.  She has mild aortic valve stenosis.  She is not having any symptoms referable to her aortic valve.

## 2013-02-23 NOTE — Assessment & Plan Note (Signed)
Initial blood pressure was high today but on recheck was normal at 118/76.  She has been taking Lasix 20 mg daily and has not been experiencing any sensation of fluid buildup.  She has not been having a dizzy spells or syncope

## 2013-02-25 NOTE — Progress Notes (Signed)
Quick Note:  Please report to patient. The recent labs are stable. Continue same medication and careful diet. ______ 

## 2013-03-01 ENCOUNTER — Telehealth: Payer: Self-pay | Admitting: Cardiology

## 2013-03-01 NOTE — Telephone Encounter (Signed)
Message copied by Lendon Ka on Thu Mar 01, 2013  3:48 PM ------      Message from: Cassell Clement      Created: Sun Feb 25, 2013  4:32 PM       Please report to patient.  The recent labs are stable. Continue same medication and careful diet. ------

## 2013-03-01 NOTE — Telephone Encounter (Signed)
New Message  Returned call// Results??//SR

## 2013-03-01 NOTE — Telephone Encounter (Signed)
Pt made aware of lab results and Dr. Yevonne Pax recommendations.

## 2013-07-31 ENCOUNTER — Ambulatory Visit (INDEPENDENT_AMBULATORY_CARE_PROVIDER_SITE_OTHER): Payer: Medicare Other | Admitting: Cardiology

## 2013-07-31 ENCOUNTER — Encounter: Payer: Self-pay | Admitting: Cardiology

## 2013-07-31 ENCOUNTER — Other Ambulatory Visit: Payer: Medicare Other

## 2013-07-31 VITALS — BP 148/74 | HR 59 | Ht 62.0 in | Wt 125.0 lb

## 2013-07-31 DIAGNOSIS — M545 Low back pain, unspecified: Secondary | ICD-10-CM

## 2013-07-31 DIAGNOSIS — M1990 Unspecified osteoarthritis, unspecified site: Secondary | ICD-10-CM

## 2013-07-31 DIAGNOSIS — I493 Ventricular premature depolarization: Secondary | ICD-10-CM

## 2013-07-31 DIAGNOSIS — I119 Hypertensive heart disease without heart failure: Secondary | ICD-10-CM

## 2013-07-31 DIAGNOSIS — E7800 Pure hypercholesterolemia, unspecified: Secondary | ICD-10-CM

## 2013-07-31 DIAGNOSIS — I350 Nonrheumatic aortic (valve) stenosis: Secondary | ICD-10-CM

## 2013-07-31 DIAGNOSIS — I4949 Other premature depolarization: Secondary | ICD-10-CM

## 2013-07-31 DIAGNOSIS — I359 Nonrheumatic aortic valve disorder, unspecified: Secondary | ICD-10-CM

## 2013-07-31 LAB — CBC WITH DIFFERENTIAL/PLATELET
Basophils Absolute: 0.1 10*3/uL (ref 0.0–0.1)
Basophils Relative: 0.8 % (ref 0.0–3.0)
Eosinophils Absolute: 0.3 10*3/uL (ref 0.0–0.7)
Eosinophils Relative: 4.1 % (ref 0.0–5.0)
HCT: 39.6 % (ref 36.0–46.0)
Hemoglobin: 13.3 g/dL (ref 12.0–15.0)
Lymphocytes Relative: 19.7 % (ref 12.0–46.0)
Lymphs Abs: 1.2 10*3/uL (ref 0.7–4.0)
MCHC: 33.5 g/dL (ref 30.0–36.0)
MCV: 88.7 fl (ref 78.0–100.0)
Monocytes Absolute: 0.6 10*3/uL (ref 0.1–1.0)
Monocytes Relative: 10 % (ref 3.0–12.0)
Neutro Abs: 4.1 10*3/uL (ref 1.4–7.7)
Neutrophils Relative %: 65.4 % (ref 43.0–77.0)
Platelets: 211 10*3/uL (ref 150.0–400.0)
RBC: 4.47 Mil/uL (ref 3.87–5.11)
RDW: 14.6 % (ref 11.5–15.5)
WBC: 6.3 10*3/uL (ref 4.0–10.5)

## 2013-07-31 LAB — HEPATIC FUNCTION PANEL
ALT: 19 U/L (ref 0–35)
AST: 24 U/L (ref 0–37)
Albumin: 3.8 g/dL (ref 3.5–5.2)
Alkaline Phosphatase: 84 U/L (ref 39–117)
Bilirubin, Direct: 0 mg/dL (ref 0.0–0.3)
Total Bilirubin: 0.8 mg/dL (ref 0.2–1.2)
Total Protein: 6.9 g/dL (ref 6.0–8.3)

## 2013-07-31 LAB — LIPID PANEL
Cholesterol: 154 mg/dL (ref 0–200)
HDL: 77.9 mg/dL (ref >39.00)
LDL Cholesterol: 69 mg/dL (ref 0–99)
Total CHOL/HDL Ratio: 2
Triglycerides: 38 mg/dL (ref 0.0–149.0)
VLDL: 7.6 mg/dL (ref 0.0–40.0)

## 2013-07-31 LAB — BASIC METABOLIC PANEL
BUN: 23 mg/dL (ref 6–23)
CO2: 27 mEq/L (ref 19–32)
Calcium: 9.1 mg/dL (ref 8.4–10.5)
Chloride: 99 mEq/L (ref 96–112)
Creatinine, Ser: 0.8 mg/dL (ref 0.4–1.2)
GFR: 70.15 mL/min (ref >60.00)
Glucose, Bld: 84 mg/dL (ref 70–99)
Potassium: 4.1 mEq/L (ref 3.5–5.1)
Sodium: 133 mEq/L — ABNORMAL LOW (ref 135–145)

## 2013-07-31 MED ORDER — HYDRALAZINE HCL 10 MG PO TABS: 10.0000 mg | ORAL_TABLET | Freq: Three times a day (TID) | ORAL | Status: DC

## 2013-07-31 NOTE — Addendum Note (Signed)
Addended by: Eulis Foster on: 07/31/2013 09:06 AM   Modules accepted: Orders

## 2013-07-31 NOTE — Assessment & Plan Note (Signed)
The patient has mild aortic stenosis.  She is not having any symptoms of angina pectoris, exertional dizziness, or CHF.

## 2013-07-31 NOTE — Progress Notes (Signed)
Quick Note:  Please report to patient. The recent labs are stable. Continue same medication and careful diet. ______ 

## 2013-07-31 NOTE — Patient Instructions (Signed)
Will obtain labs today and call you with the results (lp/bmet/hfp/cbc)  ADD HYDRALAZINE 10 MG THREE TIMES A DAY  Your physician recommends that you schedule a follow-up appointment in: 4 months with fasting labs (lp/bmet/hfp) and ekg

## 2013-07-31 NOTE — Assessment & Plan Note (Signed)
Blood pressure is high.  I rechecked it and got 160/80.  She does not do well with diuretics because of hyponatremia.  We cannot go up on her amlodipine because of previous pedal edema.  We will add hydralazine 10 mg 3 times a day.  Her recent headaches may be secondary to her blood pressure

## 2013-07-31 NOTE — Assessment & Plan Note (Signed)
Patient continues to have low back pain from her osteoarthritis.  She has not been back to see Dr. Ellene Route.  She is doing reasonably well by taking tramadol twice a day.

## 2013-07-31 NOTE — Progress Notes (Signed)
Glenda [name] Date of Birth:  10/23/1924 [facility] [address] [place], [place]  [address] [phone]        Fax   [phone]   History of Present Illness:  This pleasant 80 year old woman is seen for a followup office visit. . She has a history of atypical chest pain and a history of palpitations. She had a exercise treadmill test on 01/26/11 which showed no evidence of ischemia. She did have occasional unifocal PVCs during exercise and recovery but no ventricular tachycardia. She exercised for 4 minutes and the test was stopped because of fatigue. Recently she has been feeling better. Her palpitations have improved. He has a history of valvular heart disease with an echocardiogram on 02/19/11 any evidence of mild aortic insufficiency and mild aortic stenosis and normal LV systolic function. She is not having any symptoms of CHF. She has had successful back surgery by Dr. Ellene Route. She has finished the physical therapy rehabilitation and she now resides at [facility].  She is still having back pain and takes tramadol twice a day. She is doing well there. The food is satisfactory. She walks with a cane. She is a NO CODE BLUE patient and her family brought in DO NOT RESUSCITATE forms for [name] to sign at a previous visit.  Since last visit she has been experiencing some headaches and her blood pressure has been running a little high Current Outpatient Prescriptions  Medication Sig Dispense Refill  . amLODipine (NORVASC) 2.5 MG tablet Take 2.5 mg by mouth daily.      Marland Kitchen aspirin 81 MG tablet Take 81 mg by mouth daily.      Marland Kitchen atorvastatin (LIPITOR) 20 MG tablet Take 1 tablet (20 mg total) by mouth daily.  90 tablet  3  . Cholecalciferol (VITAMIN D) 1000 UNITS capsule Take 1,000 Units by mouth daily. VITAMIN D#3      . docusate sodium (COLACE) 100 MG capsule Take 100 mg by mouth 2 (two) times daily.      Marland Kitchen guaiFENesin (MUCINEX) 600 MG 12 hr tablet  Take by mouth. As needed      . losartan (COZAAR) 100 MG tablet Take 1 tablet (100 mg total) by mouth daily.  90 tablet  3  . meclizine (ANTIVERT) 25 MG tablet Take 25 mg by mouth 3 (three) times daily as needed for dizziness.      . metoprolol succinate (TOPROL-XL) 25 MG 24 hr tablet 1/2 tablet every other day  10 tablet  11  . Multiple Vitamins-Minerals (CEROVITE PO) Take by mouth. 1  tab daily      . ondansetron (ZOFRAN) 4 MG tablet Take 4 mg by mouth every 8 (eight) hours as needed for nausea or vomiting.      . polyethylene glycol (MIRALAX / GLYCOLAX) packet Take 17 g by mouth as needed. For constipation      . temazepam (RESTORIL) 15 MG capsule Take 1 capsule (15 mg total) by mouth at bedtime as needed. Insomnia  30 capsule  5  . temazepam (RESTORIL) 15 MG capsule Take one tablet by mouth at bedtime as needed for insomnia  30 capsule  5  . traMADol (ULTRAM) 50 MG tablet 1 and 1/2 tablets 4 times a day as needed for pain  180 tablet  1  . hydrALAZINE (APRESOLINE) 10 MG tablet Take 1 tablet (10 mg total) by mouth 3 (three) times daily.  90 tablet  5  No current facility-administered medications for this visit.    Allergies  Allergen Reactions  . Amitriptyline     hyper  . Avelox [Moxifloxacin Hcl In Nacl] Other (See Comments)    dizziness  . Halcion [Triazolam]   . Pentazocine Lactate   . Sulfa Drugs Cross Reactors   . Trazodone And Nefazodone     Not effective  . Penicillins Rash    Patient Active Problem List   Diagnosis Date Noted  . Insomnia 09/15/2010    Priority: High  . Benign hypertensive heart disease without heart failure 09/15/2010    Priority: High  . Nausea 11/19/2012  . Vertigo 11/19/2012  . Unspecified constipation 11/06/2012  . Sacral fracture 11/06/2012  . Accelerated/malignant hypertension 09/14/2012  . Peripheral edema 07/11/2012  . Aortic stenosis, mild 07/11/2012  . Chronic diastolic HF (heart failure) 07/11/2012  . Osteoarthritis 04/03/2012  .  Heart murmur, aortic 02/29/2012  . Anemia 06/10/2011  . Epistaxis 06/10/2011  . Hypertension 03/25/2011  . PVC (premature ventricular contraction) 02/16/2011  . Dizziness 12/01/2010  . Hypercholesterolemia 09/15/2010    History  Smoking status  . Never Smoker   Smokeless tobacco  . Not on file    History  Alcohol Use No    Family History  Problem Relation Age of Onset  . Heart disease Mother   . Hypertension Father   . Heart disease Sister     Review of Systems: Constitutional: no fever chills diaphoresis or fatigue or change in weight.  Head and neck: no hearing loss, no epistaxis, no photophobia or visual disturbance. Respiratory: No cough, shortness of breath or wheezing. Cardiovascular: No chest pain peripheral edema, palpitations. Gastrointestinal: No abdominal distention, no abdominal pain, no change in bowel habits hematochezia or melena. Genitourinary: No dysuria, no frequency, no urgency, no nocturia. Musculoskeletal:No arthralgias, no back pain, no gait disturbance or myalgias. Neurological: No dizziness, no headaches, no numbness, no seizures, no syncope, no weakness, no tremors. Hematologic: No lymphadenopathy, no easy bruising. Psychiatric: No confusion, no hallucinations, no sleep disturbance.    Physical Exam: Filed Vitals:   07/31/13 0815  BP: 148/74  Pulse: 59   the general appearance reveals a thin elderly alert woman in no distress.The head and neck exam reveals pupils equal and reactive.  Extraocular movements are full.  There is no scleral icterus.  The mouth and pharynx are normal.  The neck is supple.  The carotids reveal no bruits.  The jugular venous pressure is normal.  The  thyroid is not enlarged.  There is no lymphadenopathy.  The chest is clear to percussion and auscultation.  There are no rales or rhonchi.  Expansion of the chest is symmetrical.  The precordium is quiet.  The first heart sound is normal.  The second heart sound is  physiologically split.  There is no  gallop rub or click.  There is a grade 2/6 systolic ejection murmur at the base.  There is no abnormal lift or heave.  The abdomen is soft and nontender.  The bowel sounds are normal.  The liver and spleen are not enlarged.  There are no abdominal masses.  There are no abdominal bruits.  Extremities reveal good pedal pulses.  There is no phlebitis or edema.  There is no cyanosis or clubbing.  Strength is normal and symmetrical in all extremities.  There is no lateralizing weakness.  There are no sensory deficits.  The skin is warm and dry.  There is no rash.    Assessment /  Plan: 1. hypertensive cardiovascular disease without heart failure 2. mild aortic stenosis 3. Hypercholesterolemia 4. PVCs 5. osteoarthritis and low back pain  Plan: We will add hydralazine 10 mg 3 times a day.  Continue other medicines at present doses.. recheck in 4 months for followup office visit EKG lipid panel hepatic function panel and basal metabolic panel.  Blood work today is pending.

## 2013-08-01 ENCOUNTER — Telehealth: Payer: Self-pay | Admitting: *Deleted

## 2013-08-01 NOTE — Telephone Encounter (Signed)
No answer. Mailed copy, highlighted  Dr. Sherryl Barters comments, and wrote if any questions to call.

## 2013-08-01 NOTE — Telephone Encounter (Signed)
Message copied by Earvin Hansen on Wed Aug 01, 2013 11:19 AM ------      Message from: Darlin Coco      Created: Tue Jul 31, 2013  8:31 PM       Please report to patient.  The recent labs are stable. Continue same medication and careful diet. ------

## 2013-12-03 ENCOUNTER — Encounter: Payer: Self-pay | Admitting: Cardiology

## 2013-12-03 ENCOUNTER — Other Ambulatory Visit: Payer: Medicare Other

## 2013-12-03 ENCOUNTER — Ambulatory Visit (INDEPENDENT_AMBULATORY_CARE_PROVIDER_SITE_OTHER): Payer: Medicare Other | Admitting: Cardiology

## 2013-12-03 VITALS — BP 168/59 | HR 63 | Ht 62.0 in | Wt 128.0 lb

## 2013-12-03 DIAGNOSIS — I5032 Chronic diastolic (congestive) heart failure: Secondary | ICD-10-CM

## 2013-12-03 DIAGNOSIS — M545 Low back pain, unspecified: Secondary | ICD-10-CM

## 2013-12-03 DIAGNOSIS — I359 Nonrheumatic aortic valve disorder, unspecified: Secondary | ICD-10-CM

## 2013-12-03 DIAGNOSIS — E7800 Pure hypercholesterolemia, unspecified: Secondary | ICD-10-CM

## 2013-12-03 DIAGNOSIS — I119 Hypertensive heart disease without heart failure: Secondary | ICD-10-CM

## 2013-12-03 DIAGNOSIS — I350 Nonrheumatic aortic (valve) stenosis: Secondary | ICD-10-CM

## 2013-12-03 LAB — HEPATIC FUNCTION PANEL
ALT: 11 U/L (ref 0–35)
AST: 23 U/L (ref 0–37)
Albumin: 3.9 g/dL (ref 3.5–5.2)
Alkaline Phosphatase: 74 U/L (ref 39–117)
Bilirubin, Direct: 0 mg/dL (ref 0.0–0.3)
Total Bilirubin: 0.5 mg/dL (ref 0.2–1.2)
Total Protein: 7.6 g/dL (ref 6.0–8.3)

## 2013-12-03 LAB — LIPID PANEL
Cholesterol: 240 mg/dL — ABNORMAL HIGH (ref 0–200)
HDL: 77.3 mg/dL (ref >39.00)
LDL Cholesterol: 146 mg/dL — ABNORMAL HIGH (ref 0–99)
NonHDL: 162.7
Total CHOL/HDL Ratio: 3
Triglycerides: 85 mg/dL (ref 0.0–149.0)
VLDL: 17 mg/dL (ref 0.0–40.0)

## 2013-12-03 LAB — BASIC METABOLIC PANEL
BUN: 27 mg/dL — ABNORMAL HIGH (ref 6–23)
CO2: 23 mEq/L (ref 19–32)
Calcium: 9.2 mg/dL (ref 8.4–10.5)
Chloride: 99 mEq/L (ref 96–112)
Creatinine, Ser: 0.9 mg/dL (ref 0.4–1.2)
GFR: 66.34 mL/min (ref >60.00)
Glucose, Bld: 89 mg/dL (ref 70–99)
Potassium: 4.5 mEq/L (ref 3.5–5.1)
Sodium: 134 mEq/L — ABNORMAL LOW (ref 135–145)

## 2013-12-03 NOTE — Assessment & Plan Note (Signed)
She is not having a symptoms referable to her mild aortic stenosis or chronic diastolic heart failure

## 2013-12-03 NOTE — Assessment & Plan Note (Signed)
The patient is tolerating atorvastatin 20 mg daily.  No myalgias.  We're checking fasting lab work today

## 2013-12-03 NOTE — Progress Notes (Signed)
Glenda [name] Date of Birth:  10-01-1924 [facility] [address] [place], [place]  [address] [phone]        Fax   [phone]   History of Present Illness:  This pleasant 80 year old woman is seen for a followup office visit. . She has a history of atypical chest pain and a history of palpitations and a history of hypertension. She had a exercise treadmill test on 01/26/11 which showed no evidence of ischemia. She did have occasional unifocal PVCs during exercise and recovery but no ventricular tachycardia. She exercised for 4 minutes and the test was stopped because of fatigue. Recently she has been feeling better. Her palpitations have improved. He has a history of valvular heart disease with an echocardiogram on 02/19/11 any evidence of mild aortic insufficiency and mild aortic stenosis and normal LV systolic function. She is not having any symptoms of CHF. She has had successful back surgery by Dr. Ellene Route. She has finished the physical therapy rehabilitation and she now resides at [facility] facility.  She is still having back pain and takes tramadol twice a day. She is doing well there. The food is satisfactory. She walks with a cane. She is a NO CODE BLUE patient and her family brought in DO NOT RESUSCITATE forms for [name] to sign at a previous visit.  Her blood pressure at the assisted living facility has been up-and-down. Current Outpatient Prescriptions  Medication Sig Dispense Refill  . amLODipine (NORVASC) 2.5 MG tablet Take 2.5 mg by mouth daily.      Marland Kitchen aspirin 81 MG tablet Take 81 mg by mouth daily.      Marland Kitchen atorvastatin (LIPITOR) 20 MG tablet Take 1 tablet (20 mg total) by mouth daily.  90 tablet  3  . Cholecalciferol (VITAMIN D) 1000 UNITS capsule Take 1,000 Units by mouth daily. VITAMIN D#3      . docusate sodium (COLACE) 100 MG capsule Take 100 mg by mouth 2 (two) times daily.      . furosemide (LASIX) 20 MG tablet Take 20 mg by mouth  as needed. ONCE A WEEK AND AS NEEDED FOR SWELLING      . guaiFENesin (MUCINEX) 600 MG 12 hr tablet Take by mouth. As needed      . hydrALAZINE (APRESOLINE) 10 MG tablet Take 1 tablet (10 mg total) by mouth 3 (three) times daily.  90 tablet  5  . HYDROcodone-acetaminophen (NORCO/VICODIN) 5-325 MG per tablet Take 1 tablet by mouth every 6 (six) hours as needed for moderate pain. TAKE 1/2 TAB AS NEEDED FOR PAIN      . losartan (COZAAR) 100 MG tablet Take 1 tablet (100 mg total) by mouth daily.  90 tablet  3  . meclizine (ANTIVERT) 25 MG tablet Take 25 mg by mouth 3 (three) times daily as needed for dizziness.      . metoprolol succinate (TOPROL-XL) 25 MG 24 hr tablet 1/2 tablet every other day  10 tablet  11  . Multiple Vitamins-Minerals (CEROVITE PO) Take by mouth. 1  tab daily      . ondansetron (ZOFRAN) 4 MG tablet Take 4 mg by mouth every 8 (eight) hours as needed for nausea or vomiting.      . polyethylene glycol (MIRALAX / GLYCOLAX) packet Take 17 g by mouth as needed. For constipation      . temazepam (RESTORIL) 15 MG capsule Take 1 capsule (15 mg total) by mouth at bedtime as  needed. Insomnia  30 capsule  5  . temazepam (RESTORIL) 15 MG capsule Take one tablet by mouth at bedtime as needed for insomnia  30 capsule  5  . traMADol (ULTRAM) 50 MG tablet 1 and 1/2 tablets 4 times a day as needed for pain  180 tablet  1   No current facility-administered medications for this visit.    Allergies  Allergen Reactions  . Amitriptyline     hyper  . Avelox [Moxifloxacin Hcl In Nacl] Other (See Comments)    dizziness  . Halcion [Triazolam]   . Pentazocine Lactate   . Sulfa Drugs Cross Reactors   . Trazodone And Nefazodone     Not effective  . Penicillins Rash    Patient Active Problem List   Diagnosis Date Noted  . Insomnia 09/15/2010    Priority: High  . Benign hypertensive heart disease without heart failure 09/15/2010    Priority: High  . Nausea 11/19/2012  . Vertigo 11/19/2012  .  Unspecified constipation 11/06/2012  . Sacral fracture 11/06/2012  . Accelerated/malignant hypertension 09/14/2012  . Peripheral edema 07/11/2012  . Aortic stenosis, mild 07/11/2012  . Chronic diastolic HF (heart failure) 07/11/2012  . Osteoarthritis 04/03/2012  . Heart murmur, aortic 02/29/2012  . Anemia 06/10/2011  . Epistaxis 06/10/2011  . Hypertension 03/25/2011  . PVC (premature ventricular contraction) 02/16/2011  . Dizziness 12/01/2010  . Hypercholesterolemia 09/15/2010    History  Smoking status  . Never Smoker   Smokeless tobacco  . Not on file    History  Alcohol Use No    Family History  Problem Relation Age of Onset  . Heart disease Mother   . Hypertension Father   . Heart disease Sister     Review of Systems: Constitutional: no fever chills diaphoresis or fatigue or change in weight.  Head and neck: no hearing loss, no epistaxis, no photophobia or visual disturbance. Respiratory: No cough, shortness of breath or wheezing. Cardiovascular: No chest pain peripheral edema, palpitations. Gastrointestinal: No abdominal distention, no abdominal pain, no change in bowel habits hematochezia or melena. Genitourinary: No dysuria, no frequency, no urgency, no nocturia. Musculoskeletal:No arthralgias, no back pain, no gait disturbance or myalgias. Neurological: No dizziness, no headaches, no numbness, no seizures, no syncope, no weakness, no tremors. Hematologic: No lymphadenopathy, no easy bruising. Psychiatric: No confusion, no hallucinations, no sleep disturbance.    Physical Exam: Filed Vitals:   12/03/13 0850  BP: 168/59  Pulse: 63   the general appearance reveals a thin elderly alert woman in no distress.The head and neck exam reveals pupils equal and reactive.  Extraocular movements are full.  There is no scleral icterus.  The mouth and pharynx are normal.  The neck is supple.  The carotids reveal no bruits.  The jugular venous pressure is normal.  The   thyroid is not enlarged.  There is no lymphadenopathy.  The chest is clear to percussion and auscultation.  There are no rales or rhonchi.  Expansion of the chest is symmetrical.  The precordium is quiet.  The first heart sound is normal.  The second heart sound is physiologically split.  There is no  gallop rub or click.  There is a grade 2/6 systolic ejection murmur at the base.  There is no abnormal lift or heave.  The abdomen is soft and nontender.  The bowel sounds are normal.  The liver and spleen are not enlarged.  There are no abdominal masses.  There are no abdominal bruits.  Extremities reveal good pedal pulses.  There is no phlebitis or edema.  There is no cyanosis or clubbing.  Strength is normal and symmetrical in all extremities.  There is no lateralizing weakness.  There are no sensory deficits.  The skin is warm and dry.  There is no rash.  EKG shows sinus rhythm with first-degree AV block and no ischemic changes  Assessment / Plan: 1. hypertensive cardiovascular disease without heart failure.  Systolic pressure is still higher than desired. 2. mild aortic stenosis 3. Hypercholesterolemia 4. PVCs 5. osteoarthritis and low back pain  Plan: Continue hydralazine 10 mg 3 times a day.  Continue other medicines at present doses.  Start taking furosemide 20 mg once a week at least.   recheck in 4 months for followup office visit  lipid panel hepatic function panel and basal metabolic panel.  Blood work today is pending.

## 2013-12-03 NOTE — Progress Notes (Signed)
Quick Note:  Please report to patient. The recent labs are stable. Continue same medication and careful diet. Cholesterol and LDL much higher. Is she still taking her atorvastatin as directed? ______

## 2013-12-03 NOTE — Assessment & Plan Note (Signed)
Blood pressure today is high systolic.  I repeated it and it was unchanged.  The patient has not been taking her diuretic on any type of regular basis.  We will have her start to take the Lasix 20 mg once a week as well as when necessary for swelling.  We will continue current dose of low-dose hydralazine 10 mg 3 times a day

## 2013-12-03 NOTE — Patient Instructions (Addendum)
Will obtain labs today and call you with the results (lp/bmet/hfp)  TAKE YOUR LASIX (FUROSEMIDE) ONCE A WEEK AND AS NEEDED FOR SWELLING  Your physician wants you to follow-up in: 4 months with fasting labs (lp/bmet/hfp)  You will receive a reminder letter in the mail two months in advance. If you don't receive a letter, please call our office to schedule the follow-up appointment.

## 2013-12-06 ENCOUNTER — Telehealth: Payer: Self-pay | Admitting: Cardiology

## 2013-12-06 MED ORDER — ATORVASTATIN CALCIUM 20 MG PO TABS: 20.0000 mg | ORAL_TABLET | Freq: Every day | ORAL | Status: DC

## 2013-12-06 MED ORDER — FUROSEMIDE 20 MG PO TABS: 20.0000 mg | ORAL_TABLET | ORAL | Status: DC | PRN

## 2013-12-06 NOTE — Telephone Encounter (Signed)
Advised patient of lab results. Called pharmacy and she has not been getting Atorvastatin, v/o given

## 2013-12-06 NOTE — Telephone Encounter (Signed)
Message copied by Earvin Hansen on Thu Dec 06, 2013  2:27 PM ------      Message from: Darlin Coco      Created: Mon Dec 03, 2013  9:26 PM       Please report to patient.  The recent labs are stable. Continue same medication and careful diet. Cholesterol and LDL much higher.  Is she still taking her atorvastatin as directed? ------

## 2013-12-06 NOTE — Telephone Encounter (Signed)
New message     Wanted Glenda [name] to have her new phone number.

## 2014-02-20 ENCOUNTER — Telehealth: Payer: Self-pay | Admitting: *Deleted

## 2014-02-20 NOTE — Telephone Encounter (Signed)
New Message  Pt stated that she has received the vacc at [facility] where she lives sometime in November

## 2014-03-01 ENCOUNTER — Encounter: Payer: Self-pay | Admitting: Cardiology

## 2014-03-28 ENCOUNTER — Ambulatory Visit (INDEPENDENT_AMBULATORY_CARE_PROVIDER_SITE_OTHER): Payer: Medicare Other | Admitting: Cardiology

## 2014-03-28 ENCOUNTER — Encounter: Payer: Self-pay | Admitting: Cardiology

## 2014-03-28 ENCOUNTER — Other Ambulatory Visit (INDEPENDENT_AMBULATORY_CARE_PROVIDER_SITE_OTHER): Payer: Medicare Other | Admitting: *Deleted

## 2014-03-28 VITALS — BP 150/72 | HR 57 | Ht 62.0 in | Wt 127.8 lb

## 2014-03-28 DIAGNOSIS — I119 Hypertensive heart disease without heart failure: Secondary | ICD-10-CM

## 2014-03-28 DIAGNOSIS — M545 Low back pain: Secondary | ICD-10-CM

## 2014-03-28 DIAGNOSIS — E7800 Pure hypercholesterolemia, unspecified: Secondary | ICD-10-CM

## 2014-03-28 DIAGNOSIS — I350 Nonrheumatic aortic (valve) stenosis: Secondary | ICD-10-CM

## 2014-03-28 LAB — LIPID PANEL
Cholesterol: 168 mg/dL (ref 0–200)
HDL: 76 mg/dL (ref >39.00)
LDL Cholesterol: 80 mg/dL (ref 0–99)
NonHDL: 92
Total CHOL/HDL Ratio: 2
Triglycerides: 59 mg/dL (ref 0.0–149.0)
VLDL: 11.8 mg/dL (ref 0.0–40.0)

## 2014-03-28 LAB — BASIC METABOLIC PANEL
BUN: 27 mg/dL — ABNORMAL HIGH (ref 6–23)
CO2: 23 mEq/L (ref 19–32)
Calcium: 9.4 mg/dL (ref 8.4–10.5)
Chloride: 105 mEq/L (ref 96–112)
Creatinine, Ser: 1 mg/dL (ref 0.4–1.2)
GFR: 54.44 mL/min — ABNORMAL LOW (ref >60.00)
Glucose, Bld: 93 mg/dL (ref 70–99)
Potassium: 4.4 mEq/L (ref 3.5–5.1)
Sodium: 139 mEq/L (ref 135–145)

## 2014-03-28 LAB — HEPATIC FUNCTION PANEL
ALT: 13 U/L (ref 0–35)
AST: 26 U/L (ref 0–37)
Albumin: 4.3 g/dL (ref 3.5–5.2)
Alkaline Phosphatase: 78 U/L (ref 39–117)
Bilirubin, Direct: 0.1 mg/dL (ref 0.0–0.3)
Total Bilirubin: 0.7 mg/dL (ref 0.2–1.2)
Total Protein: 7.7 g/dL (ref 6.0–8.3)

## 2014-03-28 NOTE — Patient Instructions (Signed)
Will obtain labs today and call you with the results (lp/bmet/hfp)  Your physician recommends that you continue on your current medications as directed. Please refer to the Current Medication list given to you today.  Your physician wants you to follow-up in: 4 months with fasting labs (lp/bmet/hfp) You will receive a reminder letter in the mail two months in advance. If you don't receive a letter, please call our office to schedule the follow-up appointment.   HAVE THE NURSE CHECK YOUR BLOOD PRESSURE WEEKLY

## 2014-03-28 NOTE — Progress Notes (Signed)
Quick Note:  Please report to patient. The recent labs are stable. Continue same medication and careful diet. Cholesterol is better. LDL is better. ______

## 2014-03-28 NOTE — Progress Notes (Signed)
Glenda [name] Date of Birth:  [birth year] [facility] [address] [place], [place]  [address] [phone]        Fax   [phone]   History of Present Illness:  This pleasant 81 year old woman is seen for a followup office visit. . She has a history of atypical chest pain and a history of palpitations and a history of hypertension. She had a exercise treadmill test on 01/26/11 which showed no evidence of ischemia. She did have occasional unifocal PVCs during exercise and recovery but no ventricular tachycardia. She exercised for 4 minutes and the test was stopped because of fatigue. Recently she has been feeling better. Her palpitations have improved. He has a history of valvular heart disease with an echocardiogram on 02/19/11 any evidence of mild aortic insufficiency and mild aortic stenosis and normal LV systolic function. She is not having any symptoms of CHF.  She has had successful back surgery by Dr. Ellene Route. She has finished the physical therapy rehabilitation and she now resides at [facility].  She is still having back pain and takes tramadol twice a day. She is doing well there. The food is satisfactory. She walks with a cane. She is a NO CODE BLUE patient and her family brought in DO NOT RESUSCITATE forms for [name] to sign at a previous visit.  Her blood pressure at the assisted living facility has been up-and-down.  We have requested that the facility check her blood pressure once a week.  She states however that this is not being done on a regular consistent basis. She has had some intermittent sensation of a possible incisional hernia in her lower abdomen.  It is not causing her any pain however. She has a history of hypercholesterolemia.  She is on atorvastatin.  She is not having any myalgias. Current Outpatient Prescriptions  Medication Sig Dispense Refill  . amLODipine (NORVASC) 2.5 MG tablet Take 2.5 mg by mouth daily.    Marland Kitchen  aspirin 81 MG tablet Take 81 mg by mouth daily.    Marland Kitchen atorvastatin (LIPITOR) 20 MG tablet Take 1 tablet (20 mg total) by mouth daily. 90 tablet 3  . Cholecalciferol (VITAMIN D) 1000 UNITS capsule Take 1,000 Units by mouth daily. VITAMIN D#3    . docusate sodium (COLACE) 100 MG capsule Take 100 mg by mouth 2 (two) times daily.    . furosemide (LASIX) 20 MG tablet Take 1 tablet (20 mg total) by mouth as needed. ONCE A WEEK AND AS NEEDED FOR SWELLING 30 tablet prn  . hydrALAZINE (APRESOLINE) 10 MG tablet Take 1 tablet (10 mg total) by mouth 3 (three) times daily. 90 tablet 5  . HYDROcodone-acetaminophen (NORCO/VICODIN) 5-325 MG per tablet Take 1 tablet by mouth every 6 (six) hours as needed for moderate pain. TAKE 1/2 TAB AS NEEDED FOR PAIN    . losartan (COZAAR) 100 MG tablet Take 1 tablet (100 mg total) by mouth daily. 90 tablet 3  . meclizine (ANTIVERT) 25 MG tablet Take 25 mg by mouth 3 (three) times daily as needed for dizziness.    . metoprolol succinate (TOPROL-XL) 25 MG 24 hr tablet 1/2 tablet every other day 10 tablet 11  . Multiple Vitamins-Minerals (CEROVITE PO) Take by mouth. 1  tab daily    . ondansetron (ZOFRAN) 4 MG tablet Take 4 mg by mouth every 8 (eight) hours as needed for nausea or vomiting.    . polyethylene glycol (MIRALAX / GLYCOLAX)  packet Take 17 g by mouth as needed. For constipation    . sulfamethoxazole-trimethoprim (BACTRIM DS,SEPTRA DS) 800-160 MG per tablet Take 800 tablets by mouth 2 (two) times daily.    . temazepam (RESTORIL) 15 MG capsule Take 1 capsule (15 mg total) by mouth at bedtime as needed. Insomnia 30 capsule 5  . traMADol (ULTRAM) 50 MG tablet 1 and 1/2 tablets 4 times a day as needed for pain 180 tablet 1   No current facility-administered medications for this visit.    Allergies  Allergen Reactions  . Ace Inhibitors     Cough  . Amitriptyline     hyper  . Avelox [Moxifloxacin Hcl In Nacl] Other (See Comments)    dizziness  . Halcion [Triazolam]     . Pentazocine Lactate   . Sulfa Drugs Cross Reactors   . Trazodone And Nefazodone     Not effective  . Penicillins Rash    Patient Active Problem List   Diagnosis Date Noted  . Insomnia 09/15/2010    Priority: High  . Benign hypertensive heart disease without heart failure 09/15/2010    Priority: High  . Nausea 11/19/2012  . Vertigo 11/19/2012  . Unspecified constipation 11/06/2012  . Sacral fracture 11/06/2012  . Accelerated/malignant hypertension 09/14/2012  . Peripheral edema 07/11/2012  . Aortic stenosis, mild 07/11/2012  . Chronic diastolic HF (heart failure) 07/11/2012  . Osteoarthritis 04/03/2012  . Heart murmur, aortic 02/29/2012  . Anemia 06/10/2011  . Epistaxis 06/10/2011  . Hypertension 03/25/2011  . PVC (premature ventricular contraction) 02/16/2011  . Dizziness 12/01/2010  . Hypercholesterolemia 09/15/2010    History  Smoking status  . Never Smoker   Smokeless tobacco  . Not on file    History  Alcohol Use No    Family History  Problem Relation Age of Onset  . Heart disease Mother   . Hypertension Father   . Heart disease Sister     Review of Systems: Constitutional: no fever chills diaphoresis or fatigue or change in weight.  Head and neck: no hearing loss, no epistaxis, no photophobia or visual disturbance. Respiratory: No cough, shortness of breath or wheezing. Cardiovascular: No chest pain peripheral edema, palpitations. Gastrointestinal: No abdominal distention, no abdominal pain, no change in bowel habits hematochezia or melena. Genitourinary: No dysuria, no frequency, no urgency, no nocturia. Musculoskeletal:No arthralgias, no back pain, no gait disturbance or myalgias. Neurological: No dizziness, no headaches, no numbness, no seizures, no syncope, no weakness, no tremors. Hematologic: No lymphadenopathy, no easy bruising. Psychiatric: No confusion, no hallucinations, no sleep disturbance.    Physical Exam: Filed Vitals:    03/28/14 1040  BP: 150/72  Pulse: 57   the general appearance reveals a thin elderly alert woman in no distress.The head and neck exam reveals pupils equal and reactive.  Extraocular movements are full.  There is no scleral icterus.  The mouth and pharynx are normal.  The neck is supple.  The carotids reveal no bruits.  The jugular venous pressure is normal.  The  thyroid is not enlarged.  There is no lymphadenopathy.  The chest is clear to percussion and auscultation.  There are no rales or rhonchi.  Expansion of the chest is symmetrical.  The precordium is quiet.  The first heart sound is normal.  The second heart sound is physiologically split.  There is no  gallop rub or click.  There is a grade 2/6 systolic ejection murmur at the base.  There is no abnormal lift or heave.  The abdomen is soft and nontender.  The bowel sounds are normal.  The liver and spleen are not enlarged.  There are no abdominal masses.  There are no abdominal bruits.  Extremities reveal good pedal pulses.  There is no phlebitis or edema.  There is no cyanosis or clubbing.  Strength is normal and symmetrical in all extremities.  There is no lateralizing weakness.  There are no sensory deficits.  The skin is warm and dry.  There is no rash.    Assessment / Plan: 1. hypertensive cardiovascular disease without heart failure.  2. mild aortic stenosis.  No symptoms of heart failure or angina pectoris or dizziness/syncope with exertion 3. Hypercholesterolemia 4. PVCs 5. osteoarthritis and low back pain  Plan: Continue hydralazine 10 mg 3 times a day.  Continue other medicines at present doses.  Start taking furosemide 20 mg once a week at least.   recheck in 4 months for followup office visit  lipid panel hepatic function panel and basal metabolic panel.  Blood work today is pending.

## 2014-04-01 ENCOUNTER — Telehealth: Payer: Self-pay | Admitting: Cardiology

## 2014-04-01 NOTE — Telephone Encounter (Signed)
Advised patient of lab results  

## 2014-04-01 NOTE — Telephone Encounter (Signed)
-----   Message from Darlin Coco, MD sent at 03/28/2014  8:22 PM EST ----- Please report to patient.  The recent labs are stable. Continue same medication and careful diet. Cholesterol is better. LDL is better.

## 2014-04-01 NOTE — Telephone Encounter (Signed)
New message ° ° ° ° °Want lab results °

## 2014-08-12 ENCOUNTER — Ambulatory Visit (INDEPENDENT_AMBULATORY_CARE_PROVIDER_SITE_OTHER): Payer: Medicare Other | Admitting: Cardiology

## 2014-08-12 ENCOUNTER — Other Ambulatory Visit (INDEPENDENT_AMBULATORY_CARE_PROVIDER_SITE_OTHER): Payer: Medicare Other | Admitting: *Deleted

## 2014-08-12 ENCOUNTER — Encounter: Payer: Self-pay | Admitting: Cardiology

## 2014-08-12 VITALS — BP 160/70 | HR 67 | Ht 62.0 in | Wt 131.8 lb

## 2014-08-12 DIAGNOSIS — I350 Nonrheumatic aortic (valve) stenosis: Secondary | ICD-10-CM

## 2014-08-12 DIAGNOSIS — M545 Low back pain: Secondary | ICD-10-CM | POA: Diagnosis not present

## 2014-08-12 DIAGNOSIS — I119 Hypertensive heart disease without heart failure: Secondary | ICD-10-CM

## 2014-08-12 DIAGNOSIS — E7800 Pure hypercholesterolemia, unspecified: Secondary | ICD-10-CM

## 2014-08-12 LAB — HEPATIC FUNCTION PANEL
ALT: 14 U/L (ref 0–35)
AST: 27 U/L (ref 0–37)
Albumin: 4.1 g/dL (ref 3.5–5.2)
Alkaline Phosphatase: 78 U/L (ref 39–117)
Bilirubin, Direct: 0.2 mg/dL (ref 0.0–0.3)
Total Bilirubin: 0.7 mg/dL (ref 0.2–1.2)
Total Protein: 7.2 g/dL (ref 6.0–8.3)

## 2014-08-12 LAB — BASIC METABOLIC PANEL
BUN: 26 mg/dL — ABNORMAL HIGH (ref 6–23)
CO2: 25 mEq/L (ref 19–32)
Calcium: 9.4 mg/dL (ref 8.4–10.5)
Chloride: 102 mEq/L (ref 96–112)
Creatinine, Ser: 0.79 mg/dL (ref 0.40–1.20)
GFR: 73.05 mL/min (ref >60.00)
Glucose, Bld: 96 mg/dL (ref 70–99)
Potassium: 4.2 mEq/L (ref 3.5–5.1)
Sodium: 134 mEq/L — ABNORMAL LOW (ref 135–145)

## 2014-08-12 LAB — LIPID PANEL
Cholesterol: 147 mg/dL (ref 0–200)
HDL: 71.6 mg/dL (ref >39.00)
LDL Cholesterol: 65 mg/dL (ref 0–99)
NonHDL: 75.4
Total CHOL/HDL Ratio: 2
Triglycerides: 53 mg/dL (ref 0.0–149.0)
VLDL: 10.6 mg/dL (ref 0.0–40.0)

## 2014-08-12 NOTE — Patient Instructions (Signed)
Medication Instructions:  Your physician recommends that you continue on your current medications as directed. Please refer to the Current Medication list given to you today.  Labwork: none  Testing/Procedures: none  Follow-Up: Your physician wants you to follow-up in: 4 months with fasting labs (lp/bmet/hfp/cbc)  You will receive a reminder letter in the mail two months in advance. If you don't receive a letter, please call our office to schedule the follow-up appointment.   Any Other Special Instructions Will Be Listed Below (If Applicable).

## 2014-08-12 NOTE — Progress Notes (Signed)
Quick Note:  Please report to patient. The recent labs are stable. Continue same medication and careful diet. ______ 

## 2014-08-12 NOTE — Progress Notes (Signed)
Cardiology Office Note   Date:  08/12/2014   ID:  Glenda [name], DOB [birth year], MRN [id number]  PCP:  Warren Danes, MD  Cardiologist: Darlin Coco MD  No chief complaint on file.     History of Present Illness: Glenda [name] is a 81 y.o. female who presents for scheduled four-month follow-up office visit.  This pleasant 81 year old woman is seen for a followup office visit. . She has a history of atypical chest pain and a history of palpitations and a history of hypertension. She had a exercise treadmill test on 01/26/11 which showed no evidence of ischemia. She did have occasional unifocal PVCs during exercise and recovery but no ventricular tachycardia. She exercised for 4 minutes and the test was stopped because of fatigue. Recently she has been feeling better. Her palpitations have improved. He has a history of valvular heart disease with an echocardiogram on 02/19/11 any evidence of mild aortic insufficiency and mild aortic stenosis and normal LV systolic function. She is not having any symptoms of CHF. She has had successful back surgery by Dr. Ellene Route. She has finished the physical therapy rehabilitation and she now resides at [facility] assisted [facility] facility. She is still having back pain .  The tramadol is no longer helping her.  She has been taking generic Vicodin.. She is doing well there. The food is satisfactory. She walks with a cane. She is a NO CODE BLUE patient and her family brought in DO NOT RESUSCITATE forms for [name] to sign at a previous visit. Her blood pressure at the assisted living facility has been up-and-down. We have requested that the facility check her blood pressure once a week. She states however that this is not being done on a regular consistent basis. She has a history of hypercholesterolemia. She is on atorvastatin. She is not having any myalgias. She has not been having any symptoms from her aortic valve disease.  Her main problem is  her back pain related to her osteoarthritis.  She is requesting a rolling walker with a seat and we gave her a prescription for that today. Past Medical History  Diagnosis Date  . Hypertension   . Peripheral edema   . PVCs (premature ventricular contractions)   . TIA (transient ischemic attack)   . Hiatal hernia   . Diverticulosis   . OA (osteoarthritis) of knee   . Hypercholesterolemia   . Insomnia   . DDD (degenerative disc disease), lumbar     severe facet dz and adv DDD MRI L spine 2009  . Aortic stenosis     Echo 02/2011 showing mild AS with normal LV systolic function    Past Surgical History  Procedure Laterality Date  . Cardiac catheterization  02/17/2001    MILD REGURGITATION. EF 60%  . Colonoscopy    . Cesarean section    . Hemorroidectomy    . Breast biopsy      LEFT BREAST  . Kyphoplasty Right 09/11/2012    Procedure: Right Acrylic Sacroplasty;  Surgeon: Kristeen Miss, MD;  Location: [facility];  Service: Neurosurgery;  Laterality: Right;  Right  Acrylic Sacroplasty     Current Outpatient Prescriptions  Medication Sig Dispense Refill  . amLODipine (NORVASC) 2.5 MG tablet Take 2.5 mg by mouth daily.    Marland Kitchen aspirin 81 MG tablet Take 81 mg by mouth daily.    Marland Kitchen atorvastatin (LIPITOR) 20 MG tablet Take 1 tablet (20 mg total) by mouth daily. 90 tablet 3  .  Cholecalciferol (VITAMIN D) 1000 UNITS capsule Take 1,000 Units by mouth daily. VITAMIN D#3    . docusate sodium (COLACE) 100 MG capsule Take 100 mg by mouth 2 (two) times daily.    . furosemide (LASIX) 20 MG tablet Take 1 tablet (20 mg total) by mouth as needed. ONCE A WEEK AND AS NEEDED FOR SWELLING (Patient taking differently: Take 20 mg by mouth. ONCE A WEEK (THRUSDAY) AND AS NEEDED FOR SWELLING) 30 tablet prn  . hydrALAZINE (APRESOLINE) 10 MG tablet Take 1 tablet (10 mg total) by mouth 3 (three) times daily. 90 tablet 5  . HYDROcodone-acetaminophen (NORCO/VICODIN) 5-325 MG per tablet Take 1 tablet by mouth every 6  (six) hours as needed for moderate pain. TAKE 1/2 TAB AS NEEDED FOR PAIN    . losartan (COZAAR) 100 MG tablet Take 1 tablet (100 mg total) by mouth daily. 90 tablet 3  . meclizine (ANTIVERT) 25 MG tablet Take 25 mg by mouth 3 (three) times daily as needed for dizziness.    . metoprolol succinate (TOPROL-XL) 25 MG 24 hr tablet 1/2 tablet every other day 10 tablet 11  . Multiple Vitamins-Minerals (CEROVITE PO) Take by mouth. 1  tab daily    . ondansetron (ZOFRAN) 4 MG tablet Take 4 mg by mouth every 8 (eight) hours as needed for nausea or vomiting.    . polyethylene glycol (MIRALAX / GLYCOLAX) packet Take 17 g by mouth as needed. For constipation    . temazepam (RESTORIL) 15 MG capsule Take 1 capsule (15 mg total) by mouth at bedtime as needed. Insomnia 30 capsule 5   No current facility-administered medications for this visit.    Allergies:   Ace inhibitors; Amitriptyline; Avelox; Halcion; Pentazocine lactate; Sulfa drugs cross reactors; Trazodone and nefazodone; and Penicillins    Social History:  The patient  reports that she has never smoked. She does not have any smokeless tobacco history on file. She reports that she does not drink alcohol or use illicit drugs.   Family History:  The patient's family history includes Heart disease in her mother and sister; Hypertension in her father.    ROS:  Please see the history of present illness.   Otherwise, review of systems are positive for none.   All other systems are reviewed and negative.    PHYSICAL EXAM: VS:  BP 160/70 mmHg  Pulse 67  Ht 5\' 2"  (1.575 m)  Wt 131 lb 12.8 oz (59.784 kg)  BMI 24.10 kg/m2  SpO2 97% , BMI Body mass index is 24.1 kg/(m^2). GEN: Well nourished, well developed, in no acute distress HEENT: normal Neck: no JVD, carotid bruits, or masses Cardiac: RRR; there is a grade 2/6 systolic ejection murmur at the base which radiates to the carotids. Respiratory:  clear to auscultation bilaterally, normal work of  breathing GI: soft, nontender, nondistended, + BS MS: no deformity or atrophy Skin: warm and dry, no rash Neuro:  Strength and sensation are intact Psych: euthymic mood, full affect   EKG:  EKG is not ordered today.    Recent Labs: 03/28/2014: ALT 13; BUN 27*; Creatinine 1.0; Potassium 4.4; Sodium 139    Lipid Panel    Component Value Date/Time   CHOL 168 03/28/2014 1126   TRIG 59.0 03/28/2014 1126   HDL 76.00 03/28/2014 1126   CHOLHDL 2 03/28/2014 1126   VLDL 11.8 03/28/2014 1126   LDLCALC 80 03/28/2014 1126      Wt Readings from Last 3 Encounters:  08/12/14 131 lb 12.8  oz (59.784 kg)  03/28/14 127 lb 12.8 oz (57.97 kg)  12/03/13 128 lb (58.06 kg)         ASSESSMENT AND PLAN:  1. hypertensive cardiovascular disease without heart failure.  2. mild aortic stenosis. No symptoms of heart failure or angina pectoris or dizziness/syncope with exertion 3. Hypercholesterolemia 4. PVCs 5. osteoarthritis and low back pain  Plan: Continue hydralazine 10 mg 3 times a day. Continue other medicines at present doses. Continue taking furosemide 20 mg once a week at least. recheck in 4 months for followup office visit lipid panel hepatic function panel and basal metabolic panel. Blood work today is pending.  In regard to her back pain, I suggested that she make an appointment with her neurosurgeon to update her condition.  She sees Dr. Ellene Route.   Current medicines are reviewed at length with the patient today.  The patient does not have concerns regarding medicines.  The following changes have been made:  no change  Labs/ tests ordered today include:   Orders Placed This Encounter  Procedures  . Lipid panel  . Hepatic function panel  . Basic metabolic panel  . CBC with Differential/Platelet   Disposition: Recheck here in 4 months for office visit CBC lipid panel hepatic function panel and basal metabolic panel.  Blood pressure here at the office today was elevated.   She will continue to have the assisted living nurse check it once a week.  Berna Spare MD 08/12/2014 1:07 PM    [address] [place], [place], [place]  [address] Phone: [phone]; Fax: [phone]

## 2014-09-11 ENCOUNTER — Telehealth: Payer: Self-pay | Admitting: Cardiology

## 2014-09-11 NOTE — Telephone Encounter (Signed)
New Prob    Requesting documentation stating why pt needs a transport chair. Pt is unable to use cane, crutch, or walker. Please fax over to [phone] attn: [name].

## 2014-09-11 NOTE — Telephone Encounter (Signed)
Spoke with [name] with [facility] and patient needs office visit with documentation for a transport chair  Patient had requested transport chair instead of walker with seat  Spoke with patient and offered her an appointment, she declined for now and will call back if she changes her mind

## 2014-11-21 ENCOUNTER — Other Ambulatory Visit (INDEPENDENT_AMBULATORY_CARE_PROVIDER_SITE_OTHER): Payer: Medicare Other | Admitting: *Deleted

## 2014-11-21 ENCOUNTER — Encounter: Payer: Self-pay | Admitting: Cardiology

## 2014-11-21 ENCOUNTER — Ambulatory Visit (INDEPENDENT_AMBULATORY_CARE_PROVIDER_SITE_OTHER): Payer: Medicare Other | Admitting: Cardiology

## 2014-11-21 VITALS — BP 152/70 | HR 57 | Ht 62.0 in | Wt 130.0 lb

## 2014-11-21 DIAGNOSIS — I119 Hypertensive heart disease without heart failure: Secondary | ICD-10-CM | POA: Diagnosis not present

## 2014-11-21 DIAGNOSIS — E7800 Pure hypercholesterolemia, unspecified: Secondary | ICD-10-CM

## 2014-11-21 DIAGNOSIS — I350 Nonrheumatic aortic (valve) stenosis: Secondary | ICD-10-CM | POA: Diagnosis not present

## 2014-11-21 LAB — HEPATIC FUNCTION PANEL
ALT: 12 U/L (ref 0–35)
AST: 22 U/L (ref 0–37)
Albumin: 4.2 g/dL (ref 3.5–5.2)
Alkaline Phosphatase: 82 U/L (ref 39–117)
Bilirubin, Direct: 0.2 mg/dL (ref 0.0–0.3)
Total Bilirubin: 0.7 mg/dL (ref 0.2–1.2)
Total Protein: 7.5 g/dL (ref 6.0–8.3)

## 2014-11-21 LAB — CBC WITH DIFFERENTIAL/PLATELET
Basophils Absolute: 0 10*3/uL (ref 0.0–0.1)
Basophils Relative: 0.4 % (ref 0.0–3.0)
Eosinophils Absolute: 0.2 10*3/uL (ref 0.0–0.7)
Eosinophils Relative: 2.9 % (ref 0.0–5.0)
HCT: 40 % (ref 36.0–46.0)
Hemoglobin: 13.4 g/dL (ref 12.0–15.0)
Lymphocytes Relative: 23.6 % (ref 12.0–46.0)
Lymphs Abs: 1.6 10*3/uL (ref 0.7–4.0)
MCHC: 33.4 g/dL (ref 30.0–36.0)
MCV: 89.3 fl (ref 78.0–100.0)
Monocytes Absolute: 0.6 10*3/uL (ref 0.1–1.0)
Monocytes Relative: 9 % (ref 3.0–12.0)
Neutro Abs: 4.4 10*3/uL (ref 1.4–7.7)
Neutrophils Relative %: 64.1 % (ref 43.0–77.0)
Platelets: 210 10*3/uL (ref 150.0–400.0)
RBC: 4.49 Mil/uL (ref 3.87–5.11)
RDW: 13.9 % (ref 11.5–15.5)
WBC: 6.8 10*3/uL (ref 4.0–10.5)

## 2014-11-21 LAB — BASIC METABOLIC PANEL
BUN: 22 mg/dL (ref 6–23)
CO2: 31 mEq/L (ref 19–32)
Calcium: 9.6 mg/dL (ref 8.4–10.5)
Chloride: 99 mEq/L (ref 96–112)
Creatinine, Ser: 0.79 mg/dL (ref 0.40–1.20)
GFR: 73.01 mL/min (ref >60.00)
Glucose, Bld: 98 mg/dL (ref 70–99)
Potassium: 4 mEq/L (ref 3.5–5.1)
Sodium: 136 mEq/L (ref 135–145)

## 2014-11-21 LAB — LIPID PANEL
Cholesterol: 156 mg/dL (ref 0–200)
HDL: 70.2 mg/dL (ref >39.00)
LDL Cholesterol: 76 mg/dL (ref 0–99)
NonHDL: 85.94
Total CHOL/HDL Ratio: 2
Triglycerides: 48 mg/dL (ref 0.0–149.0)
VLDL: 9.6 mg/dL (ref 0.0–40.0)

## 2014-11-21 NOTE — Progress Notes (Signed)
Cardiology Office Note   Date:  11/21/2014   ID:  Glenda [name], DOB 1924/05/21, MRN [id number]  PCP:  Warren Danes, MD  Cardiologist: Darlin Coco MD  No chief complaint on file.     History of Present Illness: Glenda [name] is a 81 y.o. female who presents for a four-month scheduled visit. This pleasant 81 year old woman is seen for a followup office visit. . She has a history of atypical chest pain and a history of palpitations and a history of hypertension. She had a exercise treadmill test on 01/26/11 which showed no evidence of ischemia. She did have occasional unifocal PVCs during exercise and recovery but no ventricular tachycardia. She exercised for 4 minutes and the test was stopped because of fatigue. Recently she has been feeling better. Her palpitations have improved. He has a history of valvular heart disease with an echocardiogram on 02/19/11 any evidence of mild aortic insufficiency and mild aortic stenosis and normal LV systolic function. She is not having any symptoms of CHF. She has had successful back surgery by Dr. Ellene Route. She has finished the physical therapy rehabilitation and she now resides at [facility] facility. She is still having back pain .Marland Kitchen She is doing well there. The food is satisfactory. She walks with a cane. She is a NO CODE BLUE patient and her family brought in DO NOT RESUSCITATE forms for [name] to sign at a previous visit. Her blood pressure at the assisted living facility has been up-and-down. We have requested that the facility check her blood pressure once a week. She states however that this is not being done on a regular consistent basis. She has a history of hypercholesterolemia. She is on atorvastatin. She is not having any myalgias. She has not been having any symptoms from her aortic valve disease. Her main problem is her back pain related to her osteoarthritis. She has been on a fentanyl patch.  She does not  think that the patches helping her and she thinks that it is causing her to be dizzy.  She requests that we stopped the patch which I did.   Past Medical History  Diagnosis Date  . Hypertension   . Peripheral edema   . PVCs (premature ventricular contractions)   . TIA (transient ischemic attack)   . Hiatal hernia   . Diverticulosis   . OA (osteoarthritis) of knee   . Hypercholesterolemia   . Insomnia   . DDD (degenerative disc disease), lumbar     severe facet dz and adv DDD MRI L spine 2009  . Aortic stenosis     Echo 02/2011 showing mild AS with normal LV systolic function    Past Surgical History  Procedure Laterality Date  . Cardiac catheterization  02/17/2001    MILD REGURGITATION. EF 60%  . Colonoscopy    . Cesarean section    . Hemorroidectomy    . Breast biopsy      LEFT BREAST  . Kyphoplasty Right 09/11/2012    Procedure: Right Acrylic Sacroplasty;  Surgeon: Kristeen Miss, MD;  Location: [facility];  Service: Neurosurgery;  Laterality: Right;  Right  Acrylic Sacroplasty     Current Outpatient Prescriptions  Medication Sig Dispense Refill  . amLODipine (NORVASC) 2.5 MG tablet Take 2.5 mg by mouth daily.    Marland Kitchen aspirin 81 MG tablet Take 81 mg by mouth daily.    Marland Kitchen atorvastatin (LIPITOR) 20 MG tablet Take 1 tablet (20 mg total) by mouth daily.  90 tablet 3  . Cholecalciferol (VITAMIN D) 1000 UNITS capsule Take 1,000 Units by mouth daily. VITAMIN D#3    . docusate sodium (COLACE) 100 MG capsule Take 100 mg by mouth 2 (two) times daily.    . furosemide (LASIX) 20 MG tablet Take 20 mg by mouth as needed for fluid (take one tablet by mouth once a week as needed for swelling).    . hydrALAZINE (APRESOLINE) 10 MG tablet Take 1 tablet (10 mg total) by mouth 3 (three) times daily. 90 tablet 5  . HYDROcodone-acetaminophen (NORCO/VICODIN) 5-325 MG per tablet Take 1 tablet by mouth every 6 (six) hours as needed for moderate pain. TAKE 1/2 TAB AS NEEDED FOR PAIN    . losartan  (COZAAR) 100 MG tablet Take 1 tablet (100 mg total) by mouth daily. 90 tablet 3  . metoprolol succinate (TOPROL-XL) 25 MG 24 hr tablet 1/2 tablet every other day 10 tablet 11  . Multiple Vitamins-Minerals (CEROVITE PO) Take by mouth. 1  tab daily    . omeprazole (PRILOSEC) 20 MG capsule Take 1 capsule by mouth daily.    . ondansetron (ZOFRAN) 4 MG tablet Take 4 mg by mouth every 8 (eight) hours as needed for nausea or vomiting.    . polyethylene glycol (MIRALAX / GLYCOLAX) packet Take 17 g by mouth as needed. For constipation    . temazepam (RESTORIL) 15 MG capsule Take 1 capsule (15 mg total) by mouth at bedtime as needed. Insomnia 30 capsule 5   No current facility-administered medications for this visit.    Allergies:   Ace inhibitors; Amitriptyline; Avelox; Halcion; Pentazocine lactate; Sulfa drugs cross reactors; Trazodone and nefazodone; and Penicillins    Social History:  The patient  reports that she has never smoked. She does not have any smokeless tobacco history on file. She reports that she does not drink alcohol or use illicit drugs.   Family History:  The patient's family history includes Heart disease in her mother and sister; Hypertension in her father.    ROS:  Please see the history of present illness.   Otherwise, review of systems are positive for none.   All other systems are reviewed and negative.    PHYSICAL EXAM: VS:  BP 152/70 mmHg  Pulse 57  Ht 5\' 2"  (1.575 m)  Wt 130 lb (58.968 kg)  BMI 23.77 kg/m2  SpO2 97% , BMI Body mass index is 23.77 kg/(m^2). GEN: Well nourished, well developed, in no acute distress.  She walks with a walker HEENT: normal Neck: no JVD, carotid bruits, or masses Cardiac: RRR; no murmurs, rubs, or gallops,no edema  Respiratory:  clear to auscultation bilaterally, normal work of breathing GI: soft, nontender, nondistended, + BS MS: no deformity or atrophy Skin: warm and dry, no rash Neuro:  Strength and sensation are intact Psych:  euthymic mood, full affect   EKG:  EKG is not ordered today.    Recent Labs: 11/21/2014: ALT 12; BUN 22; Creatinine, Ser 0.79; Hemoglobin 13.4; Platelets 210.0; Potassium 4.0; Sodium 136    Lipid Panel    Component Value Date/Time   CHOL 156 11/21/2014 1501   TRIG 48.0 11/21/2014 1501   HDL 70.20 11/21/2014 1501   CHOLHDL 2 11/21/2014 1501   VLDL 9.6 11/21/2014 1501   LDLCALC 76 11/21/2014 1501      Wt Readings from Last 3 Encounters:  11/21/14 130 lb (58.968 kg)  08/12/14 131 lb 12.8 oz (59.784 kg)  03/28/14 127 lb 12.8 oz (57.97 kg)  ASSESSMENT AND PLAN:  1. hypertensive cardiovascular disease without heart failure.  2. mild aortic stenosis. No symptoms of heart failure or angina pectoris or dizziness/syncope with exertion 3. Hypercholesterolemia 4. PVCs 5. osteoarthritis and low back pain  Plan: Continue hydralazine 10 mg 3 times a day. Continue other medicines at present doses. Continue taking furosemide 20 mg once a week at least. recheck in 4 months for followup office visit lipid panel hepatic function panel and basal metabolic panel. Blood work today is pending.At her request we are stopping the fentanyl patch.  She states it is making her dizzy and feel fuzzy headed   Current medicines are reviewed at length with the patient today.  The patient has concerns regarding medicines.  The following changes have been made:  Stop fentanyl patch  Labs/ tests ordered today include:   Orders Placed This Encounter  Procedures  . Lipid panel  . Hepatic function panel  . Basic metabolic panel  . CBC with Differential/Platelet     Disposition.  Recheck in 4 months for office visit EKG CBC lipid panel hepatic function panel and basal metabolic panel.  Berna Spare MD 11/21/2014 5:39 PM    [facility] [name], [place], [place]  [address] Phone: [phone]; Fax: [phone]

## 2014-11-21 NOTE — Patient Instructions (Signed)
Medication Instructions:  Discontinue Fentanyl patch  Labwork: none  Testing/Procedures: none  Follow-Up: Your physician wants you to follow-up in: 4 months with fasting labs (lp/bmet/hfp/cbc) ekg You will receive a reminder letter in the mail two months in advance. If you don't receive a letter, please call our office to schedule the follow-up appointment.  Any Other Special Instructions Will Be Listed Below (If Applicable). Check blood pressure once a week

## 2014-11-22 NOTE — Progress Notes (Signed)
Quick Note:  Please report to patient. The recent labs are stable. Continue same medication and careful diet. ______ 

## 2014-12-13 ENCOUNTER — Ambulatory Visit (INDEPENDENT_AMBULATORY_CARE_PROVIDER_SITE_OTHER): Payer: Medicare Other | Admitting: Cardiology

## 2014-12-13 ENCOUNTER — Encounter: Payer: Self-pay | Admitting: Cardiology

## 2014-12-13 ENCOUNTER — Telehealth: Payer: Self-pay | Admitting: Cardiology

## 2014-12-13 VITALS — BP 160/80 | HR 77 | Ht 62.0 in | Wt 128.0 lb

## 2014-12-13 DIAGNOSIS — I10 Essential (primary) hypertension: Secondary | ICD-10-CM

## 2014-12-13 DIAGNOSIS — R002 Palpitations: Secondary | ICD-10-CM | POA: Diagnosis not present

## 2014-12-13 DIAGNOSIS — R0602 Shortness of breath: Secondary | ICD-10-CM

## 2014-12-13 MED ORDER — DILTIAZEM HCL ER COATED BEADS 120 MG PO CP24: 120.0000 mg | ORAL_CAPSULE | Freq: Every day | ORAL | Status: DC

## 2014-12-13 NOTE — Progress Notes (Signed)
Cardiology Office Note   Date:  12/13/2014   ID:  Glenda [name], DOB 04-Dec-1924, MRN [id number]  PCP:  Warren Danes, MD  Cardiologist:  Dola Argyle, MD   Chief Complaint  Patient presents with  . Appointment    Add-on appointment to see the patient for palpitations      History of Present Illness: Glenda [name] is a 81 y.o. female who presents today as an add-on to help with the assessment of palpitations. She is known well to Dr. Mare Ferrari. He saw her last in the office on November 21, 2014. She was stable at that time. There is a history of hypertensive cardiovascular disease with no definite heart failure. There is a history of mild aortic stenosis. She has not had symptoms from this. She has had PVCs over time. Exercise test was done in 2012 showing no ischemia. She did have unifocal PVCs during exercise and recovery but no ventricular tachycardia.  The patient called today asking to be seen. She says that she has noted some palpitations along with some shortness of breath for approximately 2 weeks. She is a difficult historian. She is here with her son who is a Marine scientist. She's had a very mild fine tremor in both hands that she thinks is worse also in the past 2 weeks.  Past Medical History  Diagnosis Date  . Hypertension   . Peripheral edema   . PVCs (premature ventricular contractions)   . TIA (transient ischemic attack)   . Hiatal hernia   . Diverticulosis   . OA (osteoarthritis) of knee   . Hypercholesterolemia   . Insomnia   . DDD (degenerative disc disease), lumbar     severe facet dz and adv DDD MRI L spine 2009  . Aortic stenosis     Echo 02/2011 showing mild AS with normal LV systolic function    Past Surgical History  Procedure Laterality Date  . Cardiac catheterization  02/17/2001    MILD REGURGITATION. EF 60%  . Colonoscopy    . Cesarean section    . Hemorroidectomy    . Breast biopsy      LEFT BREAST  . Kyphoplasty Right 09/11/2012   Procedure: Right Acrylic Sacroplasty;  Surgeon: Kristeen Miss, MD;  Location: [facility];  Service: Neurosurgery;  Laterality: Right;  Right  Acrylic Sacroplasty    Patient Active Problem List   Diagnosis Date Noted  . Nausea 11/19/2012  . Vertigo 11/19/2012  . Unspecified constipation 11/06/2012  . Sacral fracture 11/06/2012  . Accelerated/malignant hypertension 09/14/2012  . Peripheral edema 07/11/2012  . Aortic stenosis, mild 07/11/2012  . Chronic diastolic HF (heart failure) 07/11/2012  . Osteoarthritis 04/03/2012  . Heart murmur, aortic 02/29/2012  . Anemia 06/10/2011  . Epistaxis 06/10/2011  . Hypertension 03/25/2011  . PVC (premature ventricular contraction) 02/16/2011  . Dizziness 12/01/2010  . Insomnia 09/15/2010  . Benign hypertensive heart disease without heart failure 09/15/2010  . Hypercholesterolemia 09/15/2010      Current Outpatient Prescriptions  Medication Sig Dispense Refill  . amLODipine (NORVASC) 2.5 MG tablet Take 2.5 mg by mouth daily.    Marland Kitchen aspirin 81 MG tablet Take 81 mg by mouth daily.    Marland Kitchen atorvastatin (LIPITOR) 20 MG tablet Take 1 tablet (20 mg total) by mouth daily. 90 tablet 3  . Cholecalciferol (VITAMIN D) 1000 UNITS capsule Take 1,000 Units by mouth daily. VITAMIN D#3    . docusate sodium (COLACE) 100 MG capsule Take 100 mg by mouth 2 (  two) times daily.    . furosemide (LASIX) 20 MG tablet Take 20 mg by mouth as needed for fluid (take one tablet by mouth once a week as needed for swelling).    . hydrALAZINE (APRESOLINE) 10 MG tablet Take 1 tablet (10 mg total) by mouth 3 (three) times daily. 90 tablet 5  . HYDROcodone-acetaminophen (NORCO/VICODIN) 5-325 MG per tablet Take 1 tablet by mouth every 6 (six) hours as needed for moderate pain. TAKE 1/2 TAB AS NEEDED FOR PAIN    . losartan (COZAAR) 100 MG tablet Take 1 tablet (100 mg total) by mouth daily. 90 tablet 3  . metoprolol succinate (TOPROL-XL) 25 MG 24 hr tablet 1/2 tablet every other day 10  tablet 11  . Multiple Vitamins-Minerals (CEROVITE PO) Take by mouth. 1  tab daily    . omeprazole (PRILOSEC) 20 MG capsule Take 1 capsule by mouth daily.    . ondansetron (ZOFRAN) 4 MG tablet Take 4 mg by mouth every 8 (eight) hours as needed for nausea or vomiting.    . polyethylene glycol (MIRALAX / GLYCOLAX) packet Take 17 g by mouth as needed. For constipation    . temazepam (RESTORIL) 15 MG capsule Take 1 capsule (15 mg total) by mouth at bedtime as needed. Insomnia 30 capsule 5   No current facility-administered medications for this visit.    Allergies:   Ace inhibitors; Amitriptyline; Avelox; Halcion; Pentazocine lactate; Sulfa drugs cross reactors; Trazodone and nefazodone; and Penicillins    Social History:  The patient  reports that she has never smoked. She does not have any smokeless tobacco history on file. She reports that she does not drink alcohol or use illicit drugs.   Family History:  The patient's family history includes Heart disease in her mother and sister; Hypertension in her father.    ROS:  Please see the history of present illness.    Patient denies fever, chills, headache, sweats, rash, change in vision, change in hearing, chest pain, cough, nausea or vomiting, urinary symptoms. All other systems are reviewed and are negative.    PHYSICAL EXAM: VS:  Blood pressure was 160/80. Pulse was 77. The patient weighed 128 pounds. The patient was oriented to person time and place. Affect was normal. She was here with her son. When trying to obtain an EKG she had a very fine tremor. Head was atraumatic. Sclera and conjunctiva are normal. There is no jugulovenous distention. Lungs are clear. Respiratory effort is not labored. Cardiac exam revealed an S4 with an S2. The abdomen was soft. There was no peripheral edema. There are no musculoskeletal deformities. There are no skin rashes.   EKG:   EKG was done today and reviewed by me. There is marked interference from the tremor  in her hands. I do not see any obvious QRS change. I cannot determine the underlying rhythm from this 12-lead. The computer analysis suggests normal sinus rhythm. The patient was hoped back to the EKG machine to obtain a 3-lead rhythm analysis. I watched the rhythm on the EKG machine. She was definitely in sinus rhythm. However at one point I did see 5 beats of a faster rhythm with a narrow complex that appeared to be regular. This may have been a short bursts of supraventricular tachycardia.   Recent Labs: 11/21/2014: ALT 12; BUN 22; Creatinine, Ser 0.79; Hemoglobin 13.4; Platelets 210.0; Potassium 4.0; Sodium 136    Lipid Panel    Component Value Date/Time   CHOL 156 11/21/2014 1501  TRIG 48.0 11/21/2014 1501   HDL 70.20 11/21/2014 1501   CHOLHDL 2 11/21/2014 1501   VLDL 9.6 11/21/2014 1501   LDLCALC 76 11/21/2014 1501      Wt Readings from Last 3 Encounters:  11/21/14 130 lb (58.968 kg)  08/12/14 131 lb 12.8 oz (59.784 kg)  03/28/14 127 lb 12.8 oz (57.97 kg)      Current medicines are reviewed       ASSESSMENT AND PLAN:

## 2014-12-13 NOTE — Assessment & Plan Note (Signed)
With the information available today, it is very difficult to assess her palpitations. Her tremor makes EKG analysis impossible. However I mentioned that I was able to watch her rhythm on the EKG machine. She had sinus rhythm. However I saw one 5 beat episode of a narrow complex tachycardia that was regular. It is possible that she is having runs of supraventricular tachycardia. By history she has had resting bradycardia and therefore only a small dose of beta blocker is used. I have therefore chosen to empirically start 120 mg of Cardizem CD. I've tried to reassure her. I've made arrangements for her to be seen back next week by Dr. Mare Ferrari. At that time he can see if the Cardizem is helping her. I did make it clear to the patient and her son that I felt that the fine tremor in her hands is not a cardiac issue.

## 2014-12-13 NOTE — Telephone Encounter (Signed)
New message      Pt c/o of Chest Pain: STAT if CP now or developed within 24 hours  1. Are you having CP right now?  Chest discomfort, throbbing now 2. Are you experiencing any other symptoms (ex. SOB, nausea, vomiting, sweating)? Weakness 3. How long have you been experiencing CP? About 2 weeks 4. Is your CP continuous or coming and going?  Almost continuous  5. Have you taken Nitroglycerin? no?

## 2014-12-13 NOTE — Assessment & Plan Note (Signed)
Today the patient describes some shortness of breath. Her volume status appears to be stable. I think she is feeling some shortness of breath when she feels palpitations.

## 2014-12-13 NOTE — Telephone Encounter (Signed)
Spoke with patient and she has been having irregular heartbeat constantly for about 2 weeks She is having shortness of breath and fast HR with little exertion  She is c/o of weakness Last week she did have some sharp chest pain Discussed with Dr Ron Parker and will have patient come in today for OV Patient aware of appointment

## 2014-12-13 NOTE — Patient Instructions (Addendum)
Medication Instructions:  Start taking Cardizem CD 120 mg daily (Please take 1 tablet today)-all other medications remain the same  Labwork: None  Testing/Procedures: None  Follow-Up: Your physician recommends that you schedule a follow-up appointment in: Next week with Dr Mare Ferrari.

## 2014-12-20 ENCOUNTER — Encounter: Payer: Self-pay | Admitting: Cardiology

## 2014-12-20 ENCOUNTER — Ambulatory Visit (INDEPENDENT_AMBULATORY_CARE_PROVIDER_SITE_OTHER): Payer: Medicare Other | Admitting: Cardiology

## 2014-12-20 VITALS — BP 158/70 | HR 59 | Ht 62.0 in | Wt 131.0 lb

## 2014-12-20 DIAGNOSIS — I350 Nonrheumatic aortic (valve) stenosis: Secondary | ICD-10-CM | POA: Diagnosis not present

## 2014-12-20 DIAGNOSIS — I119 Hypertensive heart disease without heart failure: Secondary | ICD-10-CM

## 2014-12-20 DIAGNOSIS — R5383 Other fatigue: Secondary | ICD-10-CM

## 2014-12-20 DIAGNOSIS — R5381 Other malaise: Secondary | ICD-10-CM | POA: Diagnosis not present

## 2014-12-20 DIAGNOSIS — E7800 Pure hypercholesterolemia, unspecified: Secondary | ICD-10-CM

## 2014-12-20 DIAGNOSIS — R002 Palpitations: Secondary | ICD-10-CM

## 2014-12-20 MED ORDER — DILTIAZEM HCL ER COATED BEADS 120 MG PO CP24: 120.0000 mg | ORAL_CAPSULE | Freq: Every day | ORAL | Status: DC

## 2014-12-20 NOTE — Patient Instructions (Signed)
Medication Instructions:  Your physician recommends that you continue on your current medications as directed. Please refer to the Current Medication list given to you today.  Labwork: none  Testing/Procedures: none  Follow-Up: Your physician wants you to follow-up in: 4 months with fasting labs (lp/bmet/hfp/ths/cbc)  You will receive a reminder letter in the mail two months in advance. If you don't receive a letter, please call our office to schedule the follow-up appointment.

## 2014-12-20 NOTE — Progress Notes (Signed)
Cardiology Office Note   Date:  12/20/2014   ID:  Glenda [name], DOB 1925/02/06, MRN [id number]  PCP:  Warren Danes, MD  Cardiologist: Darlin Coco MD  No chief complaint on file.     History of Present Illness: Glenda [name] is a 81 y.o. female who presents for scheduled four-month office visit. This pleasant 81 year-old woman is seen for a followup office visit. . She has a history of atypical chest pain and a history of palpitations and a history of hypertension. She had a exercise treadmill test on 01/26/11 which showed no evidence of ischemia. She did have occasional unifocal PVCs during exercise and recovery but no ventricular tachycardia. She exercised for 4 minutes and the test was stopped because of fatigue. Recently she has been feeling better. Her palpitations have improved. He has a history of valvular heart disease with an echocardiogram on 02/19/11 any evidence of mild aortic insufficiency and mild aortic stenosis and normal LV systolic function. She is not having any symptoms of CHF. She has had successful back surgery by Dr. Ellene Route. She has finished the physical therapy rehabilitation and she now resides at [facility] facility. She is still having back pain .Marland Kitchen She is doing well there. The food is satisfactory. She walks with a cane. She is a NO CODE BLUE patient and her family brought in DO NOT RESUSCITATE forms for [name] to sign at a previous visit. Her blood pressure at the assisted living facility has been up-and-down. We have requested that the facility check her blood pressure once a week. She states however that this is not being done on a regular consistent basis. She has a history of hypercholesterolemia. She is on atorvastatin. She is not having any myalgias. She has not been having any symptoms from her aortic valve disease. Her main problem is her back pain related to her osteoarthritis.  She has an appointment soon with Dr.  Durward Fortes to discuss her back pain further. From the cardiac standpoint she has not been having any chest pain.  She has rare palpitations which have not bothered her too much.  A week ago she was seen as a work in by Dr. Ron Parker because of symptomatic palpitations.  EKG showed that she was not in atrial fibrillation.  She was started on diltiazem CD 120 mg daily and this has helped her.  We will continue this.  Her pulse is down to 59 so we will not increase the dose.  Past Medical History  Diagnosis Date  . Hypertension   . Peripheral edema   . PVCs (premature ventricular contractions)   . TIA (transient ischemic attack)   . Hiatal hernia   . Diverticulosis   . OA (osteoarthritis) of knee   . Hypercholesterolemia   . Insomnia   . DDD (degenerative disc disease), lumbar     severe facet dz and adv DDD MRI L spine 2009  . Aortic stenosis     Echo 02/2011 showing mild AS with normal LV systolic function    Past Surgical History  Procedure Laterality Date  . Cardiac catheterization  02/17/2001    MILD REGURGITATION. EF 60%  . Colonoscopy    . Cesarean section    . Hemorroidectomy    . Breast biopsy      LEFT BREAST  . Kyphoplasty Right 09/11/2012    Procedure: Right Acrylic Sacroplasty;  Surgeon: Kristeen Miss, MD;  Location: [facility];  Service: Neurosurgery;  Laterality: Right;  Right  Acrylic Sacroplasty     Current Outpatient Prescriptions  Medication Sig Dispense Refill  . amLODipine (NORVASC) 2.5 MG tablet Take 2.5 mg by mouth daily.    Marland Kitchen aspirin 81 MG tablet Take 81 mg by mouth daily.    Marland Kitchen atorvastatin (LIPITOR) 20 MG tablet Take 1 tablet (20 mg total) by mouth daily. 90 tablet 3  . Cholecalciferol (VITAMIN D) 1000 UNITS capsule Take 1,000 Units by mouth daily. VITAMIN D#3    . diltiazem (CARDIZEM CD) 120 MG 24 hr capsule Take 1 capsule (120 mg total) by mouth daily. 90 capsule 3  . docusate sodium (COLACE) 100 MG capsule Take 100 mg by mouth 2 (two) times daily.    .  furosemide (LASIX) 20 MG tablet Take 20 mg by mouth as needed for fluid (take one tablet by mouth once a week as needed for swelling).    . hydrALAZINE (APRESOLINE) 10 MG tablet Take 1 tablet (10 mg total) by mouth 3 (three) times daily. 90 tablet 5  . HYDROcodone-acetaminophen (NORCO/VICODIN) 5-325 MG per tablet Take 1 tablet by mouth every 6 (six) hours as needed for moderate pain. TAKE 1/2 TAB AS NEEDED FOR PAIN    . losartan (COZAAR) 100 MG tablet Take 1 tablet (100 mg total) by mouth daily. 90 tablet 3  . Melatonin 10 MG CAPS Take 10 mg by mouth at bedtime as needed (insomnia).    . metoprolol succinate (TOPROL-XL) 25 MG 24 hr tablet 1/2 tablet every other day 10 tablet 11  . Multiple Vitamins-Minerals (CEROVITE PO) Take by mouth. 1  tab daily    . omeprazole (PRILOSEC) 20 MG capsule Take 1 capsule by mouth daily.    . ondansetron (ZOFRAN) 4 MG tablet Take 4 mg by mouth every 8 (eight) hours as needed for nausea or vomiting.    . polyethylene glycol (MIRALAX / GLYCOLAX) packet Take 17 g by mouth as needed. For constipation    . temazepam (RESTORIL) 15 MG capsule Take 1 capsule (15 mg total) by mouth at bedtime as needed. Insomnia 30 capsule 5   No current facility-administered medications for this visit.    Allergies:   Ace inhibitors; Amitriptyline; Avelox; Halcion; Pentazocine lactate; Sulfa drugs cross reactors; Trazodone and nefazodone; and Penicillins    Social History:  The patient  reports that she has never smoked. She does not have any smokeless tobacco history on file. She reports that she does not drink alcohol or use illicit drugs.   Family History:  The patient's family history includes Heart disease in her mother and sister; Hypertension in her father and sister. There is no history of Heart attack or Stroke.    ROS:  Please see the history of present illness.   Otherwise, review of systems are positive for none.   All other systems are reviewed and negative.    PHYSICAL  EXAM: VS:  BP 158/70 mmHg  Pulse 59  Ht 5\' 2"  (1.575 m)  Wt 131 lb (59.421 kg)  BMI 23.95 kg/m2 , BMI Body mass index is 23.95 kg/(m^2). GEN: Well nourished, well developed, in no acute distress HEENT: normal Neck: no JVD, carotid bruits, or masses Cardiac: RRR; grade 2/6 systolic ejection murmur at the aortic area.  No diastolic.  No, rubs, or gallops,no edema  Respiratory:  clear to auscultation bilaterally, normal work of breathing GI: soft, nontender, nondistended, + BS MS: no deformity or atrophy Skin: warm and dry, no rash Neuro:  Strength and sensation are intact  Psych: euthymic mood, full affect   EKG:  EKG is ordered today. The ekg ordered today demonstrates normal sinus rhythm with heart rate 59 and first-degree AV block.   Recent Labs: 11/21/2014: ALT 12; BUN 22; Creatinine, Ser 0.79; Hemoglobin 13.4; Platelets 210.0; Potassium 4.0; Sodium 136    Lipid Panel    Component Value Date/Time   CHOL 156 11/21/2014 1501   TRIG 48.0 11/21/2014 1501   HDL 70.20 11/21/2014 1501   CHOLHDL 2 11/21/2014 1501   VLDL 9.6 11/21/2014 1501   LDLCALC 76 11/21/2014 1501      Wt Readings from Last 3 Encounters:  12/20/14 131 lb (59.421 kg)  12/13/14 128 lb (58.06 kg)  11/21/14 130 lb (58.968 kg)        ASSESSMENT AND PLAN:  .1. hypertensive cardiovascular disease without heart failure.  2. mild aortic stenosis. No symptoms of heart failure or angina pectoris or dizziness/syncope with exertion 3. Hypercholesterolemia 4. PVCs 5. osteoarthritis and low back pain 6.  Malaise and fatigue  Plan: She has had a good symptomatic response to low-dose diltiazem CD 120 mg daily.   Current medicines are reviewed at length with the patient today.  The patient has no concerns regarding medicines.  The following changes have been made:  no change  Labs/ tests ordered today include:   Orders Placed This Encounter  Procedures  . EKG 12-Lead    Disposition: Continue current  medication.  Recheck in 4 months for office visit lipid panel hepatic function panel basal metabolic panel CBC and TSH.  She has complained of malaise and fatigue  Signed, Darlin Coco MD 12/20/2014 5:52 PM    [address], [address], [place]  [address] Phone: [phone]; Fax: [phone]

## 2015-01-02 ENCOUNTER — Other Ambulatory Visit: Payer: Self-pay | Admitting: Orthopaedic Surgery

## 2015-01-02 DIAGNOSIS — M545 Low back pain: Secondary | ICD-10-CM

## 2015-01-06 ENCOUNTER — Ambulatory Visit
Admission: RE | Admit: 2015-01-06 | Discharge: 2015-01-06 | Disposition: A | Discharge status: Home / Self Care | Payer: Medicare Other | Source: Ambulatory Visit | Attending: Orthopaedic Surgery | Admitting: Orthopaedic Surgery

## 2015-01-06 DIAGNOSIS — M545 Low back pain: Secondary | ICD-10-CM

## 2015-05-01 ENCOUNTER — Other Ambulatory Visit (INDEPENDENT_AMBULATORY_CARE_PROVIDER_SITE_OTHER): Payer: Medicare Other | Admitting: *Deleted

## 2015-05-01 ENCOUNTER — Ambulatory Visit (INDEPENDENT_AMBULATORY_CARE_PROVIDER_SITE_OTHER): Payer: Medicare Other | Admitting: Cardiology

## 2015-05-01 ENCOUNTER — Encounter: Payer: Self-pay | Admitting: Cardiology

## 2015-05-01 VITALS — BP 160/68 | HR 60 | Ht 62.0 in | Wt 130.8 lb

## 2015-05-01 DIAGNOSIS — I350 Nonrheumatic aortic (valve) stenosis: Secondary | ICD-10-CM

## 2015-05-01 DIAGNOSIS — E7800 Pure hypercholesterolemia, unspecified: Secondary | ICD-10-CM

## 2015-05-01 DIAGNOSIS — R002 Palpitations: Secondary | ICD-10-CM

## 2015-05-01 DIAGNOSIS — I119 Hypertensive heart disease without heart failure: Secondary | ICD-10-CM

## 2015-05-01 LAB — CBC WITH DIFFERENTIAL/PLATELET
Basophils Absolute: 0.1 10*3/uL (ref 0.0–0.1)
Basophils Relative: 1 % (ref 0–1)
Eosinophils Absolute: 0.2 10*3/uL (ref 0.0–0.7)
Eosinophils Relative: 3 % (ref 0–5)
HCT: 38.4 % (ref 36.0–46.0)
Hemoglobin: 13.1 g/dL (ref 12.0–15.0)
Lymphocytes Relative: 33 % (ref 12–46)
Lymphs Abs: 1.8 10*3/uL (ref 0.7–4.0)
MCH: 30 pg (ref 26.0–34.0)
MCHC: 34.1 g/dL (ref 30.0–36.0)
MCV: 87.9 fL (ref 78.0–100.0)
MPV: 8.8 fL (ref 8.6–12.4)
Monocytes Absolute: 0.4 10*3/uL (ref 0.1–1.0)
Monocytes Relative: 8 % (ref 3–12)
Neutro Abs: 3 10*3/uL (ref 1.7–7.7)
Neutrophils Relative %: 55 % (ref 43–77)
Platelets: 185 10*3/uL (ref 150–400)
RBC: 4.37 MIL/uL (ref 3.87–5.11)
RDW: 13.5 % (ref 11.5–15.5)
WBC: 5.5 10*3/uL (ref 4.0–10.5)

## 2015-05-01 LAB — HEPATIC FUNCTION PANEL
ALT: 10 U/L (ref 6–29)
AST: 22 U/L (ref 10–35)
Albumin: 4.1 g/dL (ref 3.6–5.1)
Alkaline Phosphatase: 78 U/L (ref 33–130)
Bilirubin, Direct: 0.2 mg/dL (ref <=0.2)
Indirect Bilirubin: 0.4 mg/dL (ref 0.2–1.2)
Total Bilirubin: 0.6 mg/dL (ref 0.2–1.2)
Total Protein: 7.4 g/dL (ref 6.1–8.1)

## 2015-05-01 LAB — BASIC METABOLIC PANEL
BUN: 25 mg/dL (ref 7–25)
CO2: 21 mmol/L (ref 20–31)
Calcium: 9.3 mg/dL (ref 8.6–10.4)
Chloride: 101 mmol/L (ref 98–110)
Creat: 0.81 mg/dL (ref 0.60–0.88)
Glucose, Bld: 93 mg/dL (ref 65–99)
Potassium: 4.3 mmol/L (ref 3.5–5.3)
Sodium: 135 mmol/L (ref 135–146)

## 2015-05-01 LAB — LIPID PANEL
Cholesterol: 165 mg/dL (ref 125–200)
HDL: 85 mg/dL (ref >=46)
LDL Cholesterol: 68 mg/dL (ref <130)
Total CHOL/HDL Ratio: 1.9 Ratio (ref <=5.0)
Triglycerides: 59 mg/dL (ref <150)
VLDL: 12 mg/dL (ref <30)

## 2015-05-01 MED ORDER — HYDRALAZINE HCL 25 MG PO TABS: 25.0000 mg | ORAL_TABLET | Freq: Three times a day (TID) | ORAL | Status: DC

## 2015-05-01 NOTE — Patient Instructions (Signed)
Medication Instructions:  INCREASE YOUR HYDRALAZINE TO 25 MG THREE TIMES A DAY   Labwork: NONE  Testing/Procedures: NONE  Follow-Up: Your physician recommends that you schedule a follow-up appointment in: IN 4 MONTHS WITH DR CRENSHAW   Any Other Special Instructions Will Be Listed Below (If Applicable). TRY [facility] FOR PRIMARY CARE   If you need a refill on your cardiac medications before your next appointment, please call your pharmacy.

## 2015-05-01 NOTE — Progress Notes (Signed)
Quick Note:  Please report to patient. The recent labs are stable. Continue same medication and careful diet. ______ 

## 2015-05-01 NOTE — Progress Notes (Signed)
Cardiology Office Note   Date:  05/01/2015   ID:  Glenda [name], DOB 11-29-1924, MRN [id number]  PCP:  Glenda Danes, MD  Cardiologist: Glenda Coco MD  No chief complaint on file.     History of Present Illness: Glenda [name] is a 82 y.o. female who presents for Four-month follow-up visit  This pleasant 82 year-old woman is seen for a followup office visit. . She has a history of atypical chest pain and a history of palpitations and a history of hypertension. She had a exercise treadmill test on 01/26/11 which showed no evidence of ischemia. She did have occasional unifocal PVCs during exercise and recovery but no ventricular tachycardia. She exercised for 4 minutes and the test was stopped because of fatigue. Recently she has been feeling better. Her palpitations have improved. He has a history of valvular heart disease with an echocardiogram on 02/19/11 any evidence of mild aortic insufficiency and mild aortic stenosis and normal LV systolic function. She is not having any symptoms of CHF. She has had successful back surgery by Glenda [name]. She has finished the physical therapy rehabilitation and she now resides at [facility] assisted [facility] facility. She is still having back pain .Marland Kitchen She is doing well there. The food is satisfactory. She walks with a cane. She is a NO CODE BLUE patient and her family brought in DO NOT RESUSCITATE forms for [name] to sign at a previous visit. She has a history of hypercholesterolemia. She is on atorvastatin. She is not having any myalgias. She has not been having any symptoms from her aortic valve disease. Her main problem is her back pain related to her osteoarthritis.   From the cardiac standpoint she has not been having any chest pain. She has rare palpitations which have not bothered her too much.Prior to her last visit she was seen as a work in by Glenda [name] because of symptomatic palpitations. EKG showed that she was not in atrial  fibrillation. She was started on diltiazem CD 120 mg daily and this has helped her. We will continue this. Her pulse is down to 59 so we will not increase the dose. The patient has been experiencing hair loss.  She is concerned about thyroid problems.  We are checking a TSH today. Her blood pressure is running high.  I repeated it later during the exam and it was still greater than 0000000 systolic.  We will increase her hydralazine up to 25 mg 3 times a day. Gave the patient a handicapped placard for her car today.  Past Medical History  Diagnosis Date  . Hypertension   . Peripheral edema   . PVCs (premature ventricular contractions)   . TIA (transient ischemic attack)   . Hiatal hernia   . Diverticulosis   . OA (osteoarthritis) of knee   . Hypercholesterolemia   . Insomnia   . DDD (degenerative disc disease), lumbar     severe facet dz and adv DDD MRI L spine 2009  . Aortic stenosis     Echo 02/2011 showing mild AS with normal LV systolic function    Past Surgical History  Procedure Laterality Date  . Cardiac catheterization  02/17/2001    MILD REGURGITATION. EF 60%  . Colonoscopy    . Cesarean section    . Hemorroidectomy    . Breast biopsy      LEFT BREAST  . Kyphoplasty Right 09/11/2012    Procedure: Right Acrylic Sacroplasty;  Surgeon: Glenda Miss, MD;  Location: [facility];  Service: Neurosurgery;  Laterality: Right;  Right  Acrylic Sacroplasty     Current Outpatient Prescriptions  Medication Sig Dispense Refill  . amLODipine (NORVASC) 2.5 MG tablet Take 2.5 mg by mouth daily.    Marland Kitchen aspirin 81 MG tablet Take 81 mg by mouth daily.    Marland Kitchen atorvastatin (LIPITOR) 20 MG tablet Take 1 tablet (20 mg total) by mouth daily. 90 tablet 3  . Cholecalciferol (VITAMIN D) 1000 UNITS capsule Take 1,000 Units by mouth daily. VITAMIN D#3    . Cranberry 425 MG CAPS Take 1 capsule by mouth 2 (two) times daily.    Marland Kitchen diltiazem (CARDIZEM CD) 120 MG 24 hr capsule Take 1 capsule (120 mg total)  by mouth daily. 90 capsule 3  . docusate sodium (COLACE) 100 MG capsule Take 100 mg by mouth 2 (two) times daily.    Marland Kitchen guaiFENesin (MUCINEX) 600 MG 12 hr tablet Take 600 mg by mouth 2 (two) times daily as needed for cough.    . hydrALAZINE (APRESOLINE) 25 MG tablet Take 1 tablet (25 mg total) by mouth 3 (three) times daily. 270 tablet 1  . HYDROcodone-acetaminophen (NORCO/VICODIN) 5-325 MG per tablet Take 0.5 tablets by mouth every 6 (six) hours as needed for moderate pain. TAKE 1/2 TAB AS NEEDED FOR PAIN    . losartan (COZAAR) 100 MG tablet Take 1 tablet (100 mg total) by mouth daily. 90 tablet 3  . Melatonin 10 MG CAPS Take 10 mg by mouth at bedtime as needed (insomnia).    . metoprolol succinate (TOPROL-XL) 25 MG 24 hr tablet Take 12.5 mg by mouth daily.    . Multiple Vitamins-Minerals (CEROVITE PO) Take by mouth. 1  tab daily    . NON FORMULARY Take 1 tablet by mouth daily. Cerovite advanced Formula    . omeprazole (PRILOSEC) 20 MG capsule Take 1 capsule by mouth daily.    . ondansetron (ZOFRAN) 4 MG tablet Take 4 mg by mouth every 8 (eight) hours as needed for nausea or vomiting.    . polyethylene glycol (MIRALAX / GLYCOLAX) packet Take 17 g by mouth as needed. For constipation    . temazepam (RESTORIL) 15 MG capsule Take 1 capsule (15 mg total) by mouth at bedtime as needed. Insomnia 30 capsule 5   No current facility-administered medications for this visit.    Allergies:   Ace inhibitors; Amitriptyline; Avelox; Halcion; Pentazocine lactate; Sulfa drugs cross reactors; Trazodone and nefazodone; and Penicillins    Social History:  The patient  reports that she has never smoked. She does not have any smokeless tobacco history on file. She reports that she does not drink alcohol or use illicit drugs.   Family History:  The patient's family history includes Heart disease in her mother and sister; Hypertension in her father and sister. There is no history of Heart attack or Stroke.    ROS:   Please see the history of present illness.   Otherwise, review of systems are positive for none.   All other systems are reviewed and negative.    PHYSICAL EXAM: VS:  BP 160/68 mmHg  Pulse 60  Ht 5\' 2"  (1.575 m)  Wt 130 lb 12.8 oz (59.33 kg)  BMI 23.92 kg/m2 , BMI Body mass index is 23.92 kg/(m^2). GEN: Well nourished, well developed, in no acute distress HEENT: normal Neck: no JVD, carotid bruits, or masses Cardiac: RRR; no murmurs, rubs, or gallops,no edema  Respiratory:  clear to auscultation bilaterally, normal  work of breathing GI: soft, nontender, nondistended, + BS MS: no deformity or atrophy Skin: warm and dry, no rash Neuro:  Strength and sensation are intact Psych: euthymic mood, full affect   EKG:  EKG is not ordered today.    Recent Labs: 11/21/2014: ALT 12; BUN 22; Creatinine, Ser 0.79; Hemoglobin 13.4; Platelets 210.0; Potassium 4.0; Sodium 136    Lipid Panel    Component Value Date/Time   CHOL 156 11/21/2014 1501   TRIG 48.0 11/21/2014 1501   HDL 70.20 11/21/2014 1501   CHOLHDL 2 11/21/2014 1501   VLDL 9.6 11/21/2014 1501   LDLCALC 76 11/21/2014 1501      Wt Readings from Last 3 Encounters:  05/01/15 130 lb 12.8 oz (59.33 kg)  12/20/14 131 lb (59.421 kg)  12/13/14 128 lb (58.06 kg)        ASSESSMENT AND PLAN:   .1. hypertensive cardiovascular disease without heart failure.Blood pressure not adequately controlled  2. mild aortic stenosis. No symptoms of heart failure or angina pectoris or dizziness/syncope with exertion 3. Hypercholesterolemia 4. PVCs 5. osteoarthritis and low back pain 6. Malaise and fatigue  Plan: She has had a good symptomatic response to low-dose diltiazem CD 120 mg daily in terms of decreasing her palpitations.For better blood pressure control we will increase her hydralazine up to 25 mg 3 times a day   Current medicines are reviewed at length with the patient today.  The patient does not have concerns regarding  medicines.  The following changes have been made:  no change  Labs/ tests ordered today include:  No orders of the defined types were placed in this encounter.     Disposition:   She will need to get a primary care physician.  Her son goes to [employer] at [employer].She will check with them. For cardiology follow-up she will see Dr. Stanford Breed for office visit and EKG in 4 months  Signed, Glenda Coco MD 05/01/2015 1:41 PM    [address], [place], [place]  [address] Phone: [phone]; Fax: [phone]

## 2015-05-01 NOTE — Addendum Note (Signed)
Addended by: Eulis Foster on: 05/01/2015 10:59 AM   Modules accepted: Orders

## 2015-05-01 NOTE — Addendum Note (Signed)
Addended by: Eulis Foster on: 05/01/2015 10:58 AM   Modules accepted: Orders

## 2015-09-08 NOTE — Progress Notes (Signed)
HPI: FU hypertension and aortic stenosis. Previously followed by Dr Mare Ferrari. History of atypical chest pain and palpitations. She had a exercise treadmill test on 01/26/11 which showed no evidence of ischemia. 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 Echocardiogram November 2012 showed normal LV function, grade 2 diastolic dysfunction, mild aortic stenosis/insufficiency, mild mitral regurgitation and mild biatrial enlargement. Since last seen, She has some dyspnea on exertion but no orthopnea, PND, pedal edema. She does have palpitations described as a skip but no syncope. She has an occasional ache in her chest not related to exertion.  Current Outpatient Prescriptions  Medication Sig Dispense Refill  . amLODipine (NORVASC) 2.5 MG tablet Take 2.5 mg by mouth daily.    Marland Kitchen aspirin 81 MG tablet Take 81 mg by mouth daily.    Marland Kitchen atorvastatin (LIPITOR) 20 MG tablet Take 1 tablet (20 mg total) by mouth daily. 90 tablet 3  . Cholecalciferol (VITAMIN D) 1000 UNITS capsule Take 1,000 Units by mouth daily. VITAMIN D#3    . Cranberry 425 MG CAPS Take 1 capsule by mouth 2 (two) times daily.    Marland Kitchen diltiazem (CARDIZEM CD) 180 MG 24 hr capsule Take 180 mg by mouth daily.    Marland Kitchen docusate sodium (COLACE) 100 MG capsule Take 100 mg by mouth 2 (two) times daily.    Marland Kitchen guaiFENesin (MUCINEX) 600 MG 12 hr tablet Take 600 mg by mouth daily.     . hydrALAZINE (APRESOLINE) 25 MG tablet Take 1 tablet (25 mg total) by mouth 3 (three) times daily. 270 tablet 1  . HYDROcodone-acetaminophen (NORCO/VICODIN) 5-325 MG per tablet Take 0.5 tablets by mouth every 6 (six) hours as needed for moderate pain. TAKE 1/2 TAB AS NEEDED FOR PAIN    . losartan (COZAAR) 100 MG tablet Take 1 tablet (100 mg total) by mouth daily. 90 tablet 3  . Melatonin 10 MG CAPS Take 10 mg by mouth at bedtime as needed (insomnia).    . metoprolol succinate (TOPROL-XL) 25 MG 24 hr tablet Take 12.5 mg by mouth daily.    . Multiple Vitamins-Minerals (CEROVITE PO) Take by  mouth. 1  tab daily    . NON FORMULARY Take 1 tablet by mouth daily. Cerovite advanced Formula    . omeprazole (PRILOSEC) 20 MG capsule Take 1 capsule by mouth daily.    . ondansetron (ZOFRAN) 4 MG tablet Take 4 mg by mouth every 8 (eight) hours as needed for nausea or vomiting.    . polyethylene glycol (MIRALAX / GLYCOLAX) packet Take 17 g by mouth as needed. For constipation    . temazepam (RESTORIL) 15 MG capsule Take 1 capsule (15 mg total) by mouth at bedtime as needed. Insomnia 30 capsule 5   No current facility-administered medications for this visit.     Past Medical History  Diagnosis Date  . Hypertension   . Peripheral edema   . PVCs (premature ventricular contractions)   . TIA (transient ischemic attack)   . Hiatal hernia   . Diverticulosis   . OA (osteoarthritis) of knee   . Hypercholesterolemia   . Insomnia   . DDD (degenerative disc disease), lumbar     severe facet dz and adv DDD MRI L spine 2009  . Aortic stenosis     Echo 02/2011 showing mild AS with normal LV systolic function    Past Surgical History  Procedure Laterality Date  . Cardiac catheterization  02/17/2001    MILD REGURGITATION. EF 60%  . Colonoscopy    .  Cesarean section    . Hemorroidectomy    . Breast biopsy      LEFT BREAST  . Kyphoplasty Right 09/11/2012    Procedure: Right Acrylic Sacroplasty;  Surgeon: Kristeen Miss, MD;  Location: [facility];  Service: Neurosurgery;  Laterality: Right;  Right  Acrylic Sacroplasty    Social History   Social History  . Marital Status: Married    Spouse Name: N/A  . Number of Children: N/A  . Years of Education: N/A   Occupational History  . Not on file.   Social History Main Topics  . Smoking status: Never Smoker   . Smokeless tobacco: Not on file  . Alcohol Use: No  . Drug Use: No  . Sexual Activity: No   Other Topics Concern  . Not on file   Social History Narrative    Family History  Problem Relation Age of Onset  . Heart disease  Mother   . Hypertension Father   . Heart disease Sister   . Heart attack Neg Hx   . Stroke Neg Hx   . Hypertension Sister     ROS: Back pain but no fevers or chills, productive cough, hemoptysis, dysphasia, odynophagia, melena, hematochezia, dysuria, hematuria, rash, seizure activity, orthopnea, PND, pedal edema, claudication. Remaining systems are negative.  Physical Exam: Well-developed well-nourished in no acute distress.  Skin is warm and dry.  HEENT is normal.  Neck is supple.  Chest is clear to auscultation with normal expansion.  Cardiovascular exam is regular rate and rhythm. 2/6 systolic murmur left sternal border. Abdominal exam nontender or distended. No masses palpated. Extremities show no edema. neuro grossly intact  ECG Sinus rhythm at a rate of 68. First-degree AV block. Occasional PAC. No ST changes.

## 2015-09-16 ENCOUNTER — Encounter: Payer: Self-pay | Admitting: Cardiology

## 2015-09-16 ENCOUNTER — Ambulatory Visit (INDEPENDENT_AMBULATORY_CARE_PROVIDER_SITE_OTHER): Payer: Medicare Other | Admitting: Cardiology

## 2015-09-16 VITALS — BP 168/82 | HR 68 | Ht 61.0 in | Wt 127.0 lb

## 2015-09-16 DIAGNOSIS — I350 Nonrheumatic aortic (valve) stenosis: Secondary | ICD-10-CM | POA: Diagnosis not present

## 2015-09-16 DIAGNOSIS — I119 Hypertensive heart disease without heart failure: Secondary | ICD-10-CM | POA: Diagnosis not present

## 2015-09-16 MED ORDER — HYDRALAZINE HCL 25 MG PO TABS: 25.0000 mg | ORAL_TABLET | Freq: Three times a day (TID) | ORAL | Status: DC

## 2015-09-16 NOTE — Assessment & Plan Note (Signed)
Felt secondary to PACs and PVCs. Continue Cardizem and metoprolol. I'm hesitant to advance the dose of these medications given first-degree AV block and risk of bradycardia.

## 2015-09-16 NOTE — Assessment & Plan Note (Signed)
Patient states her statin was recently discontinued. We will check lipids and liver. We will treat as needed. Given palpitations I'll also check her potassium and magnesium.

## 2015-09-16 NOTE — Assessment & Plan Note (Signed)
Blood pressure is elevated. She is also on 2 calcium blockers. Discontinue amlodipine. Increase hydralazine to 25 mg by mouth 3 times a day. Follow blood pressure and adjust regimen as needed.

## 2015-09-16 NOTE — Patient Instructions (Signed)
Medication Instructions:   STOP AMLODIPINE  INCREASE HYDRALAZINE TO 25 MG THREE TIMES DAILY  Labwork:  Your physician recommends that you return for lab work WITH ECHO AT [facility]  Testing/Procedures:  Your physician has requested that you have an echocardiogram. Echocardiography is a painless test that uses sound waves to create images of your heart. It provides your doctor with information about the size and shape of your heart and how well your heart's chambers and valves are working. This procedure takes approximately one hour. There are no restrictions for this procedure.    Follow-Up:  Your physician wants you to follow-up in: [place] will receive a reminder letter in the mail two months in advance. If you don't receive a letter, please call our office to schedule the follow-up appointment.

## 2015-09-16 NOTE — Assessment & Plan Note (Signed)
Plan repeat echocardiogram. 

## 2015-09-24 ENCOUNTER — Telehealth: Payer: Self-pay | Admitting: Cardiology

## 2015-09-24 NOTE — Telephone Encounter (Signed)
New message      Pt c/o medication issue:  1. Name of Glenda [name] 2. How are you currently taking this medication (dosage and times per day)? 25mg  tid 3. Are you having a reaction (difficulty breathing--STAT)? no  4. What is your medication issue? Pt c/o dizziness.  Can she cut back on the medication?  If yes, please fax order to [phone]

## 2015-09-24 NOTE — Telephone Encounter (Signed)
Unable to speak to Glenda [name], she has left for today, will fax this note to the number provided.

## 2015-09-24 NOTE — Telephone Encounter (Signed)
Returned call to [place] at [facility].She stated patient has been dizzy today B/P 119/72.Stated she wanted to decrease Hydralazine 25 mg back to twice a day.B/P has been ranging 150/64,138/70,140/78 pulse 62 to 64.Advised ok to hold pm dose of Hydralazine today.Advised I will send message to Dr.Crensahw for advice.

## 2015-09-24 NOTE — Telephone Encounter (Signed)
ok to change hydralazine to BID Kirk Ruths

## 2015-09-30 NOTE — Addendum Note (Signed)
Addended by: Cristopher Estimable on: 09/30/2015 08:00 AM   Modules accepted: Orders

## 2015-10-06 ENCOUNTER — Emergency Department (HOSPITAL_COMMUNITY): Payer: Medicare Other

## 2015-10-06 ENCOUNTER — Other Ambulatory Visit (HOSPITAL_COMMUNITY): Payer: Medicare Other

## 2015-10-06 ENCOUNTER — Inpatient Hospital Stay (HOSPITAL_COMMUNITY)
Admission: EM | Admit: 2015-10-06 | Discharge: 2015-10-11 | DRG: 308 | Disposition: A | Discharge status: Nursing Home (Includes ICF) | Payer: Medicare Other | Attending: Internal Medicine | Admitting: Internal Medicine

## 2015-10-06 ENCOUNTER — Encounter (HOSPITAL_COMMUNITY): Payer: Self-pay | Admitting: Emergency Medicine

## 2015-10-06 DIAGNOSIS — I10 Essential (primary) hypertension: Secondary | ICD-10-CM | POA: Diagnosis present

## 2015-10-06 DIAGNOSIS — I119 Hypertensive heart disease without heart failure: Secondary | ICD-10-CM | POA: Diagnosis present

## 2015-10-06 DIAGNOSIS — Z79899 Other long term (current) drug therapy: Secondary | ICD-10-CM

## 2015-10-06 DIAGNOSIS — R0609 Other forms of dyspnea: Secondary | ICD-10-CM | POA: Diagnosis present

## 2015-10-06 DIAGNOSIS — Z880 Allergy status to penicillin: Secondary | ICD-10-CM

## 2015-10-06 DIAGNOSIS — I471 Supraventricular tachycardia: Secondary | ICD-10-CM | POA: Diagnosis not present

## 2015-10-06 DIAGNOSIS — I4891 Unspecified atrial fibrillation: Secondary | ICD-10-CM | POA: Diagnosis present

## 2015-10-06 DIAGNOSIS — Z8249 Family history of ischemic heart disease and other diseases of the circulatory system: Secondary | ICD-10-CM

## 2015-10-06 DIAGNOSIS — E785 Hyperlipidemia, unspecified: Secondary | ICD-10-CM | POA: Diagnosis present

## 2015-10-06 DIAGNOSIS — Z8673 Personal history of transient ischemic attack (TIA), and cerebral infarction without residual deficits: Secondary | ICD-10-CM

## 2015-10-06 DIAGNOSIS — E7800 Pure hypercholesterolemia, unspecified: Secondary | ICD-10-CM | POA: Diagnosis not present

## 2015-10-06 DIAGNOSIS — G4700 Insomnia, unspecified: Secondary | ICD-10-CM | POA: Diagnosis present

## 2015-10-06 DIAGNOSIS — I110 Hypertensive heart disease with heart failure: Secondary | ICD-10-CM | POA: Diagnosis present

## 2015-10-06 DIAGNOSIS — I493 Ventricular premature depolarization: Secondary | ICD-10-CM | POA: Diagnosis present

## 2015-10-06 DIAGNOSIS — Z96651 Presence of right artificial knee joint: Secondary | ICD-10-CM

## 2015-10-06 DIAGNOSIS — I5033 Acute on chronic diastolic (congestive) heart failure: Secondary | ICD-10-CM | POA: Diagnosis present

## 2015-10-06 DIAGNOSIS — I4892 Unspecified atrial flutter: Secondary | ICD-10-CM | POA: Diagnosis not present

## 2015-10-06 DIAGNOSIS — Z888 Allergy status to other drugs, medicaments and biological substances status: Secondary | ICD-10-CM

## 2015-10-06 DIAGNOSIS — I5043 Acute on chronic combined systolic (congestive) and diastolic (congestive) heart failure: Secondary | ICD-10-CM | POA: Diagnosis present

## 2015-10-06 DIAGNOSIS — R0602 Shortness of breath: Secondary | ICD-10-CM | POA: Diagnosis present

## 2015-10-06 DIAGNOSIS — N390 Urinary tract infection, site not specified: Secondary | ICD-10-CM | POA: Diagnosis present

## 2015-10-06 DIAGNOSIS — I509 Heart failure, unspecified: Secondary | ICD-10-CM | POA: Diagnosis not present

## 2015-10-06 DIAGNOSIS — I272 Other secondary pulmonary hypertension: Secondary | ICD-10-CM | POA: Diagnosis present

## 2015-10-06 DIAGNOSIS — I080 Rheumatic disorders of both mitral and aortic valves: Secondary | ICD-10-CM | POA: Diagnosis present

## 2015-10-06 DIAGNOSIS — R0600 Dyspnea, unspecified: Secondary | ICD-10-CM | POA: Diagnosis present

## 2015-10-06 DIAGNOSIS — R000 Tachycardia, unspecified: Secondary | ICD-10-CM

## 2015-10-06 DIAGNOSIS — Z7982 Long term (current) use of aspirin: Secondary | ICD-10-CM

## 2015-10-06 DIAGNOSIS — I472 Ventricular tachycardia, unspecified: Secondary | ICD-10-CM | POA: Diagnosis present

## 2015-10-06 DIAGNOSIS — I429 Cardiomyopathy, unspecified: Secondary | ICD-10-CM | POA: Diagnosis present

## 2015-10-06 DIAGNOSIS — D649 Anemia, unspecified: Secondary | ICD-10-CM | POA: Diagnosis present

## 2015-10-06 DIAGNOSIS — I4729 Other ventricular tachycardia: Secondary | ICD-10-CM | POA: Diagnosis present

## 2015-10-06 DIAGNOSIS — Z882 Allergy status to sulfonamides status: Secondary | ICD-10-CM

## 2015-10-06 DIAGNOSIS — I700 Atherosclerosis of aorta: Secondary | ICD-10-CM | POA: Diagnosis present

## 2015-10-06 HISTORY — DX: Unspecified fracture of sacrum, initial encounter for closed fracture: S32.10XA

## 2015-10-06 HISTORY — DX: Low back pain, unspecified: M54.50

## 2015-10-06 HISTORY — DX: Other chronic pain: G89.29

## 2015-10-06 HISTORY — DX: Low back pain: M54.5

## 2015-10-06 HISTORY — DX: Unspecified osteoarthritis, unspecified site: M19.90

## 2015-10-06 HISTORY — DX: Gastro-esophageal reflux disease without esophagitis: K21.9

## 2015-10-06 HISTORY — DX: Adverse effect of unspecified anesthetic, initial encounter: T41.45XA

## 2015-10-06 HISTORY — DX: Other complications of anesthesia, initial encounter: T88.59XA

## 2015-10-06 HISTORY — DX: Unspecified chronic bronchitis: J42

## 2015-10-06 LAB — BASIC METABOLIC PANEL
Anion gap: 7 (ref 5–15)
BUN: 20 mg/dL (ref 6–20)
CO2: 25 mmol/L (ref 22–32)
Calcium: 8.7 mg/dL — ABNORMAL LOW (ref 8.9–10.3)
Chloride: 102 mmol/L (ref 101–111)
Creatinine, Ser: 0.93 mg/dL (ref 0.44–1.00)
GFR calc Af Amer: >60 mL/min (ref >60)
GFR calc non Af Amer: 53 mL/min — ABNORMAL LOW (ref >60)
Glucose, Bld: 110 mg/dL — ABNORMAL HIGH (ref 65–99)
Potassium: 4.3 mmol/L (ref 3.5–5.1)
Sodium: 134 mmol/L — ABNORMAL LOW (ref 135–145)

## 2015-10-06 LAB — URINALYSIS, ROUTINE W REFLEX MICROSCOPIC
Bilirubin Urine: NEGATIVE
Glucose, UA: NEGATIVE mg/dL
Hgb urine dipstick: NEGATIVE
Ketones, ur: NEGATIVE mg/dL
Leukocytes, UA: NEGATIVE
Nitrite: NEGATIVE
Protein, ur: NEGATIVE mg/dL
Specific Gravity, Urine: 1.023 (ref 1.005–1.030)
pH: 5.5 (ref 5.0–8.0)

## 2015-10-06 LAB — CBC WITH DIFFERENTIAL/PLATELET
Basophils Absolute: 0 10*3/uL (ref 0.0–0.1)
Basophils Relative: 0 %
Eosinophils Absolute: 0.1 10*3/uL (ref 0.0–0.7)
Eosinophils Relative: 1 %
HCT: 36.1 % (ref 36.0–46.0)
Hemoglobin: 11.8 g/dL — ABNORMAL LOW (ref 12.0–15.0)
Lymphocytes Relative: 17 %
Lymphs Abs: 1 10*3/uL (ref 0.7–4.0)
MCH: 29.5 pg (ref 26.0–34.0)
MCHC: 32.7 g/dL (ref 30.0–36.0)
MCV: 90.3 fL (ref 78.0–100.0)
Monocytes Absolute: 0.5 10*3/uL (ref 0.1–1.0)
Monocytes Relative: 8 %
Neutro Abs: 4.5 10*3/uL (ref 1.7–7.7)
Neutrophils Relative %: 74 %
Platelets: 183 10*3/uL (ref 150–400)
RBC: 4 MIL/uL (ref 3.87–5.11)
RDW: 14.3 % (ref 11.5–15.5)
WBC: 6.1 10*3/uL (ref 4.0–10.5)

## 2015-10-06 LAB — TROPONIN I: Troponin I: <0.03 ng/mL (ref <0.03)

## 2015-10-06 LAB — D-DIMER, QUANTITATIVE (NOT AT ARMC): D-Dimer, Quant: 0.86 ug/mL-FEU — ABNORMAL HIGH (ref 0.00–0.50)

## 2015-10-06 LAB — I-STAT TROPONIN, ED: Troponin i, poc: 0 ng/mL (ref 0.00–0.08)

## 2015-10-06 LAB — BRAIN NATRIURETIC PEPTIDE: B Natriuretic Peptide: 640.3 pg/mL — ABNORMAL HIGH (ref 0.0–100.0)

## 2015-10-06 MED ORDER — METOPROLOL TARTRATE 25 MG PO TABS
25.0000 mg | ORAL_TABLET | Freq: Four times a day (QID) | ORAL | Status: DC
  Administered 2015-10-06 – 2015-10-08 (×8): 25 mg via ORAL
  Filled 2015-10-06 (×8): qty 1

## 2015-10-06 MED ORDER — IOPAMIDOL (ISOVUE-370) INJECTION 76%
INTRAVENOUS | Status: AC
  Administered 2015-10-06: 100 mL
  Filled 2015-10-06: qty 100

## 2015-10-06 MED ORDER — ASPIRIN 81 MG PO CHEW
324.0000 mg | CHEWABLE_TABLET | Freq: Once | ORAL | Status: AC
  Administered 2015-10-06: 324 mg via ORAL
  Filled 2015-10-06: qty 4

## 2015-10-06 MED ORDER — METOPROLOL SUCCINATE ER 25 MG PO TB24: 12.5000 mg | ORAL_TABLET | Freq: Every day | ORAL | Status: DC

## 2015-10-06 MED ORDER — FUROSEMIDE 10 MG/ML IJ SOLN
60.0000 mg | Freq: Once | INTRAMUSCULAR | Status: AC
  Administered 2015-10-06: 60 mg via INTRAVENOUS
  Filled 2015-10-06: qty 6

## 2015-10-06 MED ORDER — MAGNESIUM CITRATE PO SOLN: 1.0000 | Freq: Once | ORAL | Status: DC | PRN

## 2015-10-06 MED ORDER — ENOXAPARIN SODIUM 40 MG/0.4ML ~~LOC~~ SOLN: 40.0000 mg | SUBCUTANEOUS | Status: DC

## 2015-10-06 MED ORDER — ATORVASTATIN CALCIUM 20 MG PO TABS
20.0000 mg | ORAL_TABLET | Freq: Every day | ORAL | Status: DC
  Administered 2015-10-07 – 2015-10-11 (×5): 20 mg via ORAL
  Filled 2015-10-06 (×5): qty 1

## 2015-10-06 MED ORDER — MORPHINE SULFATE (PF) 2 MG/ML IV SOLN: 1.0000 mg | INTRAVENOUS | Status: DC | PRN

## 2015-10-06 MED ORDER — PANTOPRAZOLE SODIUM 40 MG PO TBEC
40.0000 mg | DELAYED_RELEASE_TABLET | Freq: Every day | ORAL | Status: DC
  Administered 2015-10-07 – 2015-10-11 (×5): 40 mg via ORAL
  Filled 2015-10-06 (×5): qty 1

## 2015-10-06 MED ORDER — ACETAMINOPHEN 325 MG PO TABS
650.0000 mg | ORAL_TABLET | Freq: Four times a day (QID) | ORAL | Status: DC | PRN
  Administered 2015-10-08: 650 mg via ORAL
  Filled 2015-10-06: qty 2

## 2015-10-06 MED ORDER — ACETAMINOPHEN 650 MG RE SUPP: 650.0000 mg | Freq: Four times a day (QID) | RECTAL | Status: DC | PRN

## 2015-10-06 MED ORDER — SODIUM CHLORIDE 0.9% FLUSH
3.0000 mL | Freq: Two times a day (BID) | INTRAVENOUS | Status: DC
  Administered 2015-10-06 – 2015-10-09 (×6): 3 mL via INTRAVENOUS

## 2015-10-06 MED ORDER — HYDROCODONE-ACETAMINOPHEN 5-325 MG PO TABS
1.0000 | ORAL_TABLET | ORAL | Status: DC | PRN
  Administered 2015-10-06 – 2015-10-07 (×2): 1 via ORAL
  Filled 2015-10-06 (×2): qty 1

## 2015-10-06 MED ORDER — APIXABAN 5 MG PO TABS
5.0000 mg | ORAL_TABLET | Freq: Two times a day (BID) | ORAL | Status: DC
  Administered 2015-10-06 – 2015-10-07 (×2): 5 mg via ORAL
  Filled 2015-10-06 (×2): qty 1

## 2015-10-06 MED ORDER — HYDRALAZINE HCL 25 MG PO TABS: 25.0000 mg | ORAL_TABLET | Freq: Three times a day (TID) | ORAL | Status: DC

## 2015-10-06 MED ORDER — ATORVASTATIN CALCIUM 20 MG PO TABS: 20.0000 mg | ORAL_TABLET | Freq: Every day | ORAL | Status: DC

## 2015-10-06 MED ORDER — ONDANSETRON HCL 4 MG PO TABS
4.0000 mg | ORAL_TABLET | Freq: Three times a day (TID) | ORAL | Status: DC | PRN
  Administered 2015-10-07 – 2015-10-08 (×2): 4 mg via ORAL
  Filled 2015-10-06 (×2): qty 1

## 2015-10-06 MED ORDER — TEMAZEPAM 15 MG PO CAPS
15.0000 mg | ORAL_CAPSULE | Freq: Every evening | ORAL | Status: DC | PRN
  Administered 2015-10-06 – 2015-10-10 (×5): 15 mg via ORAL
  Filled 2015-10-06 (×5): qty 1

## 2015-10-06 MED ORDER — BISACODYL 10 MG RE SUPP
10.0000 mg | Freq: Every day | RECTAL | Status: DC | PRN
  Administered 2015-10-10: 10 mg via RECTAL
  Filled 2015-10-06: qty 1

## 2015-10-06 MED ORDER — DILTIAZEM HCL ER COATED BEADS 180 MG PO CP24: 180.0000 mg | ORAL_CAPSULE | Freq: Every day | ORAL | Status: DC

## 2015-10-06 MED ORDER — DILTIAZEM HCL ER COATED BEADS 120 MG PO CP24
120.0000 mg | ORAL_CAPSULE | Freq: Every day | ORAL | Status: DC
  Administered 2015-10-07: 120 mg via ORAL
  Filled 2015-10-06: qty 1

## 2015-10-06 MED ORDER — SENNOSIDES-DOCUSATE SODIUM 8.6-50 MG PO TABS: 1.0000 | ORAL_TABLET | Freq: Every evening | ORAL | Status: DC | PRN

## 2015-10-06 NOTE — ED Provider Notes (Addendum)
CSN: [id number]     Arrival date & time 10/06/15  1103 History   First MD Initiated Contact with Patient 10/06/15 1104     Chief Complaint  Patient presents with  . Shortness of Breath     (Consider location/radiation/quality/duration/timing/severity/associated sxs/prior Treatment) HPI Comments: Pt comes in with cc of dib. Pt has HTN, AS, PVCs, HL. Pt reports that she has been having dib the last 2 weeks. DIB is exertional and it is worsening, and now walking to the bathroom about 10 feet away gets her winded. No dizziness, or near fainting spells and no chest pain. + nausea. Pt has a mild cough, mostly dry, no hemoptysis. Pt denies lung dz, wheezing. Pt denies orthopnea or PND. Pt has no hx of PE, DVT and denies any exogenous estrogen use, long distance travels or surgery in the past 6 weeks, active cancer, recent immobilization.   ROS 10 Systems reviewed and are negative for acute change except as noted in the HPI.     Patient is a 82 y.o. female presenting with shortness of breath. The history is provided by the patient.  Shortness of Breath   Past Medical History  Diagnosis Date  . Hypertension   . Peripheral edema   . PVCs (premature ventricular contractions)   . TIA (transient ischemic attack)   . Hiatal hernia   . Diverticulosis   . OA (osteoarthritis) of knee   . Hypercholesterolemia   . Insomnia   . DDD (degenerative disc disease), lumbar     severe facet dz and adv DDD MRI L spine 2009  . Aortic stenosis     Echo 02/2011 showing mild AS with normal LV systolic function   Past Surgical History  Procedure Laterality Date  . Cardiac catheterization  02/17/2001    MILD REGURGITATION. EF 60%  . Colonoscopy    . Cesarean section    . Hemorroidectomy    . Breast biopsy      LEFT BREAST  . Kyphoplasty Right 09/11/2012    Procedure: Right Acrylic Sacroplasty;  Surgeon: Kristeen Miss, MD;  Location: [facility];  Service: Neurosurgery;  Laterality: Right;  Right   Acrylic Sacroplasty   Family History  Problem Relation Age of Onset  . Heart disease Mother   . Hypertension Father   . Heart disease Sister   . Heart attack Neg Hx   . Stroke Neg Hx   . Hypertension Sister    Social History  Substance Use Topics  . Smoking status: Never Smoker   . Smokeless tobacco: None  . Alcohol Use: No   OB History    No data available     Review of Systems  Respiratory: Positive for shortness of breath.       Allergies  Ace inhibitors; Amitriptyline; Avelox; Halcion; Pentazocine lactate; Sulfa drugs cross reactors; Trazodone and nefazodone; and Penicillins  Home Medications   Prior to Admission medications   Medication Sig Start Date End Date Taking? Authorizing Provider  aspirin 81 MG tablet Take 81 mg by mouth daily.    Historical Provider, MD  atorvastatin (LIPITOR) 20 MG tablet Take 1 tablet (20 mg total) by mouth daily. 12/06/13   Darlin Coco, MD  Cholecalciferol (VITAMIN D) 1000 UNITS capsule Take 1,000 Units by mouth daily. VITAMIN D#3 12/07/12   Darlin Coco, MD  Cranberry 425 MG CAPS Take 1 capsule by mouth 2 (two) times daily.    Historical Provider, MD  diltiazem (CARDIZEM CD) 180 MG 24 hr capsule Take  180 mg by mouth daily.    Historical Provider, MD  docusate sodium (COLACE) 100 MG capsule Take 100 mg by mouth 2 (two) times daily.    Historical Provider, MD  guaiFENesin (MUCINEX) 600 MG 12 hr tablet Take 600 mg by mouth daily.     Historical Provider, MD  hydrALAZINE (APRESOLINE) 25 MG tablet Take 1 tablet (25 mg total) by mouth 3 (three) times daily. 09/16/15   Lelon Perla, MD  HYDROcodone-acetaminophen (NORCO/VICODIN) 5-325 MG per tablet Take 0.5 tablets by mouth every 6 (six) hours as needed for moderate pain. TAKE 1/2 TAB AS NEEDED FOR PAIN    Historical Provider, MD  losartan (COZAAR) 100 MG tablet Take 1 tablet (100 mg total) by mouth daily. 12/06/12   Darlin Coco, MD  Melatonin 10 MG CAPS Take 10 mg by mouth at  bedtime as needed (insomnia).    Historical Provider, MD  metoprolol succinate (TOPROL-XL) 25 MG 24 hr tablet Take 12.5 mg by mouth daily.    Historical Provider, MD  Multiple Vitamins-Minerals (CEROVITE PO) Take by mouth. 1  tab daily    Historical Provider, MD  NON FORMULARY Take 1 tablet by mouth daily. Cerovite advanced Formula    Historical Provider, MD  omeprazole (PRILOSEC) 20 MG capsule Take 1 capsule by mouth daily. 11/08/14   Historical Provider, MD  ondansetron (ZOFRAN) 4 MG tablet Take 4 mg by mouth every 8 (eight) hours as needed for nausea or vomiting.    Historical Provider, MD  polyethylene glycol (MIRALAX / GLYCOLAX) packet Take 17 g by mouth as needed. For constipation    Historical Provider, MD  temazepam (RESTORIL) 15 MG capsule Take 1 capsule (15 mg total) by mouth at bedtime as needed. Insomnia 09/15/12   Judeth Cornfield, NP   BP 127/98 mmHg  Pulse 123  Temp(Src) 97.5 F (36.4 C) (Oral)  Resp 22  Wt 128 lb (58.06 kg)  SpO2 98% Physical Exam  Constitutional: She is oriented to person, place, and time. She appears well-developed.  HENT:  Head: Normocephalic and atraumatic.  Eyes: Conjunctivae and EOM are normal. Pupils are equal, round, and reactive to light.  Neck: Normal range of motion. Neck supple.  Cardiovascular: Normal rate, regular rhythm and normal heart sounds.   Pulmonary/Chest: Effort normal and breath sounds normal. No respiratory distress.  Abdominal: Soft. Bowel sounds are normal. She exhibits no distension. There is no tenderness. There is no rebound and no guarding.  Neurological: She is alert and oriented to person, place, and time.  Skin: Skin is warm and dry.    ED Course  Procedures (including critical care time) Labs Review Labs Reviewed  BASIC METABOLIC PANEL - Abnormal; Notable for the following:    Sodium 134 (*)    Glucose, Bld 110 (*)    Calcium 8.7 (*)    GFR calc non Af Amer 53 (*)    All other components within normal limits  CBC  WITH DIFFERENTIAL/PLATELET - Abnormal; Notable for the following:    Hemoglobin 11.8 (*)    All other components within normal limits  BRAIN NATRIURETIC PEPTIDE - Abnormal; Notable for the following:    B Natriuretic Peptide 640.3 (*)    All other components within normal limits  D-DIMER, QUANTITATIVE (NOT AT [facility]) - Abnormal; Notable for the following:    D-Dimer, Quant 0.86 (*)    All other components within normal limits  TROPONIN I  URINALYSIS, ROUTINE W REFLEX MICROSCOPIC (NOT AT [facility])  I-STAT CHEM 8,  ED  Randolm Idol, ED    Imaging Review Dg Chest 2 View  10/06/2015  CLINICAL DATA:  One week history of dyspnea. EXAM: CHEST  2 VIEW COMPARISON:  03/01/2012 FINDINGS: The heart is enlarged. There is tortuosity, ectasia and calcification of the thoracic aorta. The pulmonary hila appear normal. There are emphysematous and chronic bronchitic type lung changes but no definite acute overlying pulmonary process. No definite edema, infiltrates or effusions. The bony thorax is intact. IMPRESSION: Cardiac enlargement and chronic lung changes but no definite acute overlying pulmonary process. Electronically Signed   By: Marijo Sanes M.D.   On: 10/06/2015 12:38   Ct Angio Chest Pe W/cm &/or Wo Cm  10/06/2015  CLINICAL DATA:  82 year old female with progressive shortness of breath on exertion EXAM: CT ANGIOGRAPHY CHEST WITH CONTRAST TECHNIQUE: Multidetector CT imaging of the chest was performed using the standard protocol during bolus administration of intravenous contrast. Multiplanar CT image reconstructions and MIPs were obtained to evaluate the vascular anatomy. CONTRAST:  80 mL Isovue 370 COMPARISON:  Prior chest x-ray 10/06/2015 FINDINGS: Mediastinum: Unremarkable CT appearance of the thyroid gland. No suspicious mediastinal or hilar adenopathy. No soft tissue mediastinal mass. The thoracic esophagus is unremarkable. Heart/Vascular: Adequate opacification of pulmonary arteries to the proximal  subsegmental level. No evidence filling defect to suggest acute pulmonary embolus. The heart is enlarged, particularly the right atrium. No significant pericardial effusion. Atherosclerotic calcifications present in the left anterior descending and circumflex coronary arteries. Normal caliber thoracic aorta. Aortic atherosclerotic calcifications are present. Lungs/Pleura: Small right and trace left pleural effusions. Mild interlobular septal thickening. Diffuse mild bronchial wall thickening. 4 mm nodular opacity in the periphery of the right lower lobe (56 of series 407). There are a few other scattered patchy foci of ground-glass attenuation opacity bilaterally. Bones/Soft Tissues: No acute fracture or aggressive appearing lytic or blastic osseous lesion. Upper Abdomen: Reflux of contrast material into the dilated suprahepatic IVC and hepatic veins. Otherwise, the visualized upper abdomen is unremarkable. Review of the MIP images confirms the above findings. IMPRESSION: 1. Negative for acute pulmonary embolus. 2. Cardiomegaly with predominantly right atrial enlargement and reflux of contrast material into the dilated suprahepatic IVC and hepatic veins suggests right heart failure. 3. Small right and trace left pleural effusions. 4. Mild interlobular septal thickening and scattered foci of patchy ground-glass suggest early interstitial pulmonary edema. 5. 4 mm right lower lobe ground-glass attenuation pulmonary nodule. No follow-up recommended. This recommendation follows the consensus statement: Guidelines for Management of Incidental Pulmonary Nodules Detected on CT Images:From the Fleischner Society 2017; published online before print (10.1148/radiol.SG:5268862). 6. Coronary artery calcifications. 7.  Aortic Atherosclerosis (ICD10-170.0) 8. Diffuse mild bronchial wall thickening. Electronically Signed   By: Jacqulynn Cadet M.D.   On: 10/06/2015 15:11   I have personally reviewed and evaluated these images  and lab results as part of my medical decision-making.   EKG Interpretation   Date/Time:  Monday October 06 2015 11:14:01 EDT Ventricular Rate:  127 PR Interval:    QRS Duration: 80 QT Interval:  349 QTC Calculation: 508 R Axis:   61 Text Interpretation:  Ectopic atrial tachycardia, unifocal Low voltage,  extremity leads ST depression, probably rate related Minimal ST elevation,  lateral leads Prolonged QT interval new changes- tachycardia and non  specific ST changes Confirmed by Kathrynn Humble, MD, Angelo Caroll ([id number]) on 10/06/2015  11:23:34 AM      MDM   Final diagnoses:  Exertional dyspnea  Sinus tachycardia (HCC)  Congestive heart failure, unspecified  congestive heart failure chronicity, unspecified congestive heart failure type ([place])    Pt comes in with cc of DIB. Exertional dib. Clinical concerns are high for CHF (R sided), PE, and ACS -with exertional dyspnea being angina equivalent. Pt denies any blood loss, but high grade anemia also possible. Lungs exam is not overly impressive and pt has no lung dz. She is profoundly tachycardic, even at rest. She is tachycardic - 120s. HR is quite regular, and it doesn't appear that she is afib as it is regular rhythm. Anticipate admission.   Varney Biles, MD 10/06/15 1155  4:08 PM Pt's Dimer was elevated, CT PE done and is neg for acute process. BNP is slightly elevated - and AS or new onset R sided heart failure still on the ddx. Ambulatory pulsox - normal, just tachycardia. We will call Cards as consultation - they can further evaluate the rhythm that is sinus vs. Atrial flutter.   Varney Biles, MD 10/06/15 1609

## 2015-10-06 NOTE — ED Notes (Signed)
Walked patient around nursing station, sats 97 to 100%,with HR of 120 to 130. She C/O being SOB.

## 2015-10-06 NOTE — ED Notes (Signed)
DNR and [facility] paperwork sent upstairs with patient.

## 2015-10-06 NOTE — H&P (Signed)
History and Physical    Glenda [name] [id number] DOB: 1924-07-24 DOA: 10/06/2015   PCP: Warren Danes, MD   Patient coming from:  Assisted living  Chief Complaint: SHortness of breath   HPI: Glenda [name] is a 82 y.o. female with medical history significant for HTN, history of TIA, loss arthritis, hypercholesterolemia, degenerative disc disease,aortic stenosis,history of PVCs presenting with progressive exertional shortness of breath worse for the last 2 days , unable to walk 10 feet at the time of presentation. No dizziness, or presyncopal episodes prior to admission. Denies any chest pain. He does report nausea. The patient reports mild dry cough, without hemoptysis. She denies any wheezing. No orthopnea or paroxysmal nocturnal dyspnea. No history of PE DVT or chest wall pain. The patient denies any hormone supplements, long-distance trips, or recent surgery. She denies any history of cancer. She denies any immobilization over the last few weeks.the patient is compliant with her medication. She dus finish a course of antibiotics for the treatment of UTI   ED Course:  BP 127/98 mmHg  Pulse 123  Temp(Src) 97.5 F (36.4 C) (Oral)  Resp 22  Wt 58.06 kg (128 lb)  SpO2 98%   sodium 134, calcium 8.7, hemoglobin 11.8.  D-dimer 0.86. Troponin negative. Urinalysis is pending. Chest x-ray shows cardiac enlargement, with chronic lung changes, but without acute overlying pulmonary process. CT of the chest on 10/06/2015, with or without contrast,is negative for PE, but is cardiomegaly with predominantly right atrial enlargement, suggestive of a right heart failure, small right and trace left pleural effusion, a 4 mm right lower lobe groundglass attenuation pulmonary nodule EKG shows ectopic atrial tachycardia, unifocal low voltage extremity leads and ST depression, minimal ST elevation, prolonged QT interval new changes,QTC 508. Cardiac urology evaluation is ongoing. Hemoglobin 11.8 otherwise  unremarkable Sodium 134, otherwise CMP is unremarkable.  Review of Systems: As per HPI otherwise 10 point review of systems negative.   Past Medical History  Diagnosis Date  . Hypertension   . Peripheral edema   . PVCs (premature ventricular contractions)   . TIA (transient ischemic attack)   . Hiatal hernia   . Diverticulosis   . OA (osteoarthritis) of knee   . Hypercholesterolemia   . Insomnia   . DDD (degenerative disc disease), lumbar     severe facet dz and adv DDD MRI L spine 2009  . Aortic stenosis     Echo 02/2011 showing mild AS with normal LV systolic function    Past Surgical History  Procedure Laterality Date  . Cardiac catheterization  02/17/2001    MILD REGURGITATION. EF 60%  . Colonoscopy    . Cesarean section    . Hemorroidectomy    . Breast biopsy      LEFT BREAST  . Kyphoplasty Right 09/11/2012    Procedure: Right Acrylic Sacroplasty;  Surgeon: Kristeen Miss, MD;  Location: [facility];  Service: Neurosurgery;  Laterality: Right;  Right  Acrylic Sacroplasty    Social History Social History   Social History  . Marital Status: Married    Spouse Name: N/A  . Number of Children: N/A  . Years of Education: N/A   Occupational History  . Not on file.   Social History Main Topics  . Smoking status: Never Smoker   . Smokeless tobacco: Not on file  . Alcohol Use: No  . Drug Use: No  . Sexual Activity: No   Other Topics Concern  . Not on file   Social History  Narrative     Allergies  Allergen Reactions  . Ace Inhibitors     Cough  . Amitriptyline     hyper  . Avelox [Moxifloxacin Hcl In Nacl] Other (See Comments)    dizziness  . Halcion [Triazolam]     Doesn't recall  . Pentazocine Lactate     Doesn't recall  . Sulfa Drugs Cross Reactors     Doesn't recall  . Trazodone And Nefazodone     Not effective  . Penicillins Rash    Family History  Problem Relation Age of Onset  . Heart disease Mother   . Hypertension Father   . Heart  disease Sister   . Heart attack Neg Hx   . Stroke Neg Hx   . Hypertension Sister       Prior to Admission medications   Medication Sig Start Date End Date Taking? Authorizing Provider  aspirin 81 MG tablet Take 81 mg by mouth daily.    Historical Provider, MD  atorvastatin (LIPITOR) 20 MG tablet Take 1 tablet (20 mg total) by mouth daily. 12/06/13   Darlin Coco, MD  Cholecalciferol (VITAMIN D) 1000 UNITS capsule Take 1,000 Units by mouth daily. VITAMIN D#3 12/07/12   Darlin Coco, MD  Cranberry 425 MG CAPS Take 1 capsule by mouth 2 (two) times daily.    Historical Provider, MD  diltiazem (CARDIZEM CD) 180 MG 24 hr capsule Take 180 mg by mouth daily.    Historical Provider, MD  docusate sodium (COLACE) 100 MG capsule Take 100 mg by mouth 2 (two) times daily.    Historical Provider, MD  guaiFENesin (MUCINEX) 600 MG 12 hr tablet Take 600 mg by mouth daily.     Historical Provider, MD  hydrALAZINE (APRESOLINE) 25 MG tablet Take 1 tablet (25 mg total) by mouth 3 (three) times daily. 09/16/15   Lelon Perla, MD  HYDROcodone-acetaminophen (NORCO/VICODIN) 5-325 MG per tablet Take 0.5 tablets by mouth every 6 (six) hours as needed for moderate pain. TAKE 1/2 TAB AS NEEDED FOR PAIN    Historical Provider, MD  losartan (COZAAR) 100 MG tablet Take 1 tablet (100 mg total) by mouth daily. 12/06/12   Darlin Coco, MD  Melatonin 10 MG CAPS Take 10 mg by mouth at bedtime as needed (insomnia).    Historical Provider, MD  metoprolol succinate (TOPROL-XL) 25 MG 24 hr tablet Take 12.5 mg by mouth daily.    Historical Provider, MD  Multiple Vitamins-Minerals (CEROVITE PO) Take by mouth. 1  tab daily    Historical Provider, MD  NON FORMULARY Take 1 tablet by mouth daily. Cerovite advanced Formula    Historical Provider, MD  omeprazole (PRILOSEC) 20 MG capsule Take 1 capsule by mouth daily. 11/08/14   Historical Provider, MD  ondansetron (ZOFRAN) 4 MG tablet Take 4 mg by mouth every 8 (eight) hours as  needed for nausea or vomiting.    Historical Provider, MD  polyethylene glycol (MIRALAX / GLYCOLAX) packet Take 17 g by mouth as needed. For constipation    Historical Provider, MD  temazepam (RESTORIL) 15 MG capsule Take 1 capsule (15 mg total) by mouth at bedtime as needed. Insomnia 09/15/12   Judeth Cornfield, NP    Physical Exam:    Filed Vitals:   10/06/15 1200 10/06/15 1330 10/06/15 1450 10/06/15 1600  BP: 115/87 124/90 124/95 127/98  Pulse: 127 127 124 123  Temp:      TempSrc:      Resp: 16 16 18 22   Weight:  SpO2: 97% 97% 96% 98%       Constitutional: NAD, mildly anxious, Filed Vitals:   10/06/15 1200 10/06/15 1330 10/06/15 1450 10/06/15 1600  BP: 115/87 124/90 124/95 127/98  Pulse: 127 127 124 123  Temp:      TempSrc:      Resp: 16 16 18 22   Weight:      SpO2: 97% 97% 96% 98%   Eyes: PERRL, lids and conjunctivae normal ENMT: Mucous membranes are moist. Posterior pharynx clear of any exudate or lesions.Normal dentition.  Neck: normal, supple, no masses, no thyromegaly Respiratory: decreased breath sounds at he bases , no wheezing, no crackles. Normal respiratory effort. No accessory muscle use.  Cardiovascular: tachy Regular rate and rhythm, 2/6  murmurs / rubs / gallops. Pedal extremity edema bilaterally . 2+ pedal pulses. No carotid bruits.  Abdomen: no tenderness, no masses palpated. No hepatosplenomegaly. Bowel sounds positive.  Musculoskeletal: no clubbing / cyanosis. No joint deformity upper and lower extremities. Good ROM, no contractures. Normal muscle tone.  Skin: no rashes, lesions, ulcers.  Neurologic: CN 2-12 grossly intact. Sensation intact, DTR normal. Strength 5/5 in all 4.  Psychiatric: Normal judgment and insight. Alert and oriented x 3. Normal mood.     Labs on Admission: I have personally reviewed following labs and imaging studies  CBC:  Recent Labs Lab 10/06/15 1145  WBC 6.1  NEUTROABS 4.5  HGB 11.8*  HCT 36.1  MCV 90.3  PLT 183      Basic Metabolic Panel:  Recent Labs Lab 10/06/15 1145  NA 134*  K 4.3  CL 102  CO2 25  GLUCOSE 110*  BUN 20  CREATININE 0.93  CALCIUM 8.7*    GFR: Estimated Creatinine Clearance: 34.3 mL/min (by C-G formula based on Cr of 0.93).  Liver Function Tests: No results for input(s): AST, ALT, ALKPHOS, BILITOT, PROT, ALBUMIN in the last 168 hours. No results for input(s): LIPASE, AMYLASE in the last 168 hours. No results for input(s): AMMONIA in the last 168 hours.  Coagulation Profile: No results for input(s): INR, PROTIME in the last 168 hours.  Cardiac Enzymes:  Recent Labs Lab 10/06/15 1145  TROPONINI <0.03    BNP (last 3 results) No results for input(s): PROBNP in the last 8760 hours.  HbA1C: No results for input(s): HGBA1C in the last 72 hours.  CBG: No results for input(s): GLUCAP in the last 168 hours.  Lipid Profile: No results for input(s): CHOL, HDL, LDLCALC, TRIG, CHOLHDL, LDLDIRECT in the last 72 hours.  Thyroid Function Tests: No results for input(s): TSH, T4TOTAL, FREET4, T3FREE, THYROIDAB in the last 72 hours.  Anemia Panel: No results for input(s): VITAMINB12, FOLATE, FERRITIN, TIBC, IRON, RETICCTPCT in the last 72 hours.  Urine analysis:    Component Value Date/Time   COLORURINE YELLOW 10/06/2015 [place] 10/06/2015 1241   LABSPEC 1.023 10/06/2015 1241   PHURINE 5.5 10/06/2015 1241   GLUCOSEU NEGATIVE 10/06/2015 1241   GLUCOSEU NEGATIVE 07/05/2011 0949   HGBUR NEGATIVE 10/06/2015 1241   BILIRUBINUR NEGATIVE 10/06/2015 1241   KETONESUR NEGATIVE 10/06/2015 1241   PROTEINUR NEGATIVE 10/06/2015 1241   UROBILINOGEN 0.2 07/05/2011 0949   NITRITE NEGATIVE 10/06/2015 1241   LEUKOCYTESUR NEGATIVE 10/06/2015 1241    Sepsis Labs: @LABRCNTIP (procalcitonin:4,lacticidven:4) )No results found for this or any previous visit (from the past 240 hour(s)).   Radiological Exams on Admission: Dg Chest 2 View  10/06/2015  CLINICAL  DATA:  One week history of dyspnea. EXAM: CHEST  2  VIEW COMPARISON:  03/01/2012 FINDINGS: The heart is enlarged. There is tortuosity, ectasia and calcification of the thoracic aorta. The pulmonary hila appear normal. There are emphysematous and chronic bronchitic type lung changes but no definite acute overlying pulmonary process. No definite edema, infiltrates or effusions. The bony thorax is intact. IMPRESSION: Cardiac enlargement and chronic lung changes but no definite acute overlying pulmonary process. Electronically Signed   By: Marijo Sanes M.D.   On: 10/06/2015 12:38   Ct Angio Chest Pe W/cm &/or Wo Cm  10/06/2015  CLINICAL DATA:  82 year old female with progressive shortness of breath on exertion EXAM: CT ANGIOGRAPHY CHEST WITH CONTRAST TECHNIQUE: Multidetector CT imaging of the chest was performed using the standard protocol during bolus administration of intravenous contrast. Multiplanar CT image reconstructions and MIPs were obtained to evaluate the vascular anatomy. CONTRAST:  80 mL Isovue 370 COMPARISON:  Prior chest x-ray 10/06/2015 FINDINGS: Mediastinum: Unremarkable CT appearance of the thyroid gland. No suspicious mediastinal or hilar adenopathy. No soft tissue mediastinal mass. The thoracic esophagus is unremarkable. Heart/Vascular: Adequate opacification of pulmonary arteries to the proximal subsegmental level. No evidence filling defect to suggest acute pulmonary embolus. The heart is enlarged, particularly the right atrium. No significant pericardial effusion. Atherosclerotic calcifications present in the left anterior descending and circumflex coronary arteries. Normal caliber thoracic aorta. Aortic atherosclerotic calcifications are present. Lungs/Pleura: Small right and trace left pleural effusions. Mild interlobular septal thickening. Diffuse mild bronchial wall thickening. 4 mm nodular opacity in the periphery of the right lower lobe (56 of series 407). There are a few other scattered  patchy foci of ground-glass attenuation opacity bilaterally. Bones/Soft Tissues: No acute fracture or aggressive appearing lytic or blastic osseous lesion. Upper Abdomen: Reflux of contrast material into the dilated suprahepatic IVC and hepatic veins. Otherwise, the visualized upper abdomen is unremarkable. Review of the MIP images confirms the above findings. IMPRESSION: 1. Negative for acute pulmonary embolus. 2. Cardiomegaly with predominantly right atrial enlargement and reflux of contrast material into the dilated suprahepatic IVC and hepatic veins suggests right heart failure. 3. Small right and trace left pleural effusions. 4. Mild interlobular septal thickening and scattered foci of patchy ground-glass suggest early interstitial pulmonary edema. 5. 4 mm right lower lobe ground-glass attenuation pulmonary nodule. No follow-up recommended. This recommendation follows the consensus statement: Guidelines for Management of Incidental Pulmonary Nodules Detected on CT Images:From the Fleischner Society 2017; published online before print (10.1148/radiol.IJ:2314499). 6. Coronary artery calcifications. 7.  Aortic Atherosclerosis (ICD10-170.0) 8. Diffuse mild bronchial wall thickening. Electronically Signed   By: Jacqulynn Cadet M.D.   On: 10/06/2015 15:11    EKG: Independently reviewed.  Assessment/Plan Principal Problem:   Exertional dyspnea Active Problems:   Insomnia   Benign hypertensive heart disease without heart failure   Hypercholesterolemia   PVC (premature ventricular contraction)   Hypertension   Anemia   Chronic diastolic HF (heart failure) (HCC)    Acute DOE/SOB without hypoxia in the setting of tachycardia/ new diagnosis of atrial flutter . CTA negative for PE despite DDmer 0.86 , possible heart failure exacerbation. EKG otherwise unremarkable for ACS . Tn negative. Last 2 D echo in 2012.  No chest pain or pals.  - Telemetry - Cycle troponins - Daily weights and strict I/O -   2 D ECHO   Cardiology consult is pending, will defer to Cards on proper med regimen and other formal testing  - Continue aspirin  Lovenox for anticoagulation while inpatient    Hypertension BP 127/98 mmHg  Pulse 123.  Controlled Continue home anti-hypertensive medications. Cards to evaluate for any meds adjustment   Hyperlipidemia Continue home statins  DVT prophylaxis: Lovenox Code Status:  Full Code Family Communication:  Discussed with patient Disposition Plan: Expect patient to be discharged to assisted living after condition improves Consults called:   Cardiology  Admission status:Tele  Obs    [facility] E, PA-C [facility]   If 7PM-7AM, please contact night-coverage [email or web address] Password [email or web address]  10/06/2015, 5:07 PM

## 2015-10-06 NOTE — Consult Note (Signed)
Cardiology Consult    Patient ID: SARAHJO POQUETTE MRN: [id number], DOB/AGE: February 03, 1925   Admit date: 10/06/2015 Date of Consult: 10/06/2015  Primary Physician: Warren Danes, MD Primary Cardiologist: Dr. Stanford Breed Requesting Provider: Dr. Kathrynn Humble Reason for Consultation: Tachaycardia  Patient Profile    82 yo female with PMH of HTN/PVCs/TIA/HLD/AS/DDD and OA who presented to the [facility] ED with reports of increased DOE over the past couple of days.   Past Medical History   Past Medical History  Diagnosis Date  . Hypertension   . Peripheral edema   . PVCs (premature ventricular contractions)   . TIA (transient ischemic attack)   . Hiatal hernia   . Diverticulosis   . OA (osteoarthritis) of knee   . Hypercholesterolemia   . Insomnia   . DDD (degenerative disc disease), lumbar     severe facet dz and adv DDD MRI L spine 2009  . Aortic stenosis     Echo 02/2011 showing mild AS with normal LV systolic function    Past Surgical History  Procedure Laterality Date  . Cardiac catheterization  02/17/2001    MILD REGURGITATION. EF 60%  . Colonoscopy    . Cesarean section    . Hemorroidectomy    . Breast biopsy      LEFT BREAST  . Kyphoplasty Right 09/11/2012    Procedure: Right Acrylic Sacroplasty;  Surgeon: Kristeen Miss, MD;  Location: [facility];  Service: Neurosurgery;  Laterality: Right;  Right  Acrylic Sacroplasty     Allergies  Allergies  Allergen Reactions  . Ace Inhibitors     Cough  . Amitriptyline     hyper  . Avelox [Moxifloxacin Hcl In Nacl] Other (See Comments)    dizziness  . Halcion [Triazolam]     Doesn't recall  . Pentazocine Lactate     Doesn't recall  . Sulfa Drugs Cross Reactors     Doesn't recall  . Trazodone And Nefazodone     Not effective  . Penicillins Rash    History of Present Illness    Ms. Rubith Crotwell is a 82 yo female with PMH of HTN/PVCs/TIA/HLD/AS/DDD and OA. He was originally a patient of Dr.Brackbill and has  transferred care to Dr. Stanford Breed. She has a history of atypical chest pain and palpitations, which she was originally placed on Cardizem and metoprolol for when seen Dr. Mare Ferrari in the past. She had an exercise treadmill test on 01/26/11 which showed no evidence of ischemia. Last echocardiogram in 11/12 showed normal LV function, grade 2 diastolic dysfunction, mild aortic stenosis/insufficiency along with mild mitral regurgitation, and mild biatrial enlargement. She was last seen in the office by Dr. Stanford Breed on 09/16/2015 where she did report some palpitations, thought to be secondary to PACs and PVCs. Her Cardizem and metoprolol was continued at the current dose given she was noted to have first-degree AV block. Her blood pressure is noted to be elevated at that time and was currently taking to calcium channel blockers. Her amlodipine was discontinued and hydralazine was increased to 25 mg 3 times a day. Plan to repeat her 2-D echocardiogram to evaluate her aortic stenosis, but had not been completed yet.  She currently lives at [facility] assisted living facility, where she reports being mostly independent with minimal help with her activities of daily living. States over the past 2-3 days she has experienced progressive dyspnea on exertion with minimal activity. Stating she is unable to walk short distances within her room without stopping to  take a break. Denies any dizziness, palpitations, presyncope, syncope, chest pain or orthopnea.   Her admission labs showed stable electrolytes, BNP of 640, POC troponin negative, hemoglobin 11.8, and elevated d-dimer of 0.86. She underwent a CT of the chest which was negative for PE. Chest x-ray showed chronic lung changes but no acute edema. EKG appeared to show new atrial flutter/a-fib, but intervals appear very regular. Internal medicine was called for admission and further management.   Inpatient Medications    . aspirin  324 mg Oral Once    Family History     Family History  Problem Relation Age of Onset  . Heart disease Mother   . Hypertension Father   . Heart disease Sister   . Heart attack Neg Hx   . Stroke Neg Hx   . Hypertension Sister     Social History    Social History   Social History  . Marital Status: Married    Spouse Name: N/A  . Number of Children: N/A  . Years of Education: N/A   Occupational History  . Not on file.   Social History Main Topics  . Smoking status: Never Smoker   . Smokeless tobacco: Not on file  . Alcohol Use: No  . Drug Use: No  . Sexual Activity: No   Other Topics Concern  . Not on file   Social History Narrative     Review of Systems    General:  No chills, fever, night sweats or weight changes.  Cardiovascular: See HPI Dermatological: No rash, lesions/masses Respiratory: See HPI Urologic: No hematuria, dysuria Abdominal:   No nausea, vomiting, diarrhea, bright red blood per rectum, melena, or hematemesis Neurologic:  No visual changes, wkns, changes in mental status. All other systems reviewed and are otherwise negative except as noted above.  Physical Exam    Blood pressure 127/98, pulse 123, temperature 97.5 F (36.4 C), temperature source Oral, resp. rate 22, weight 128 lb (58.06 kg), SpO2 98 %.  General: Pleasant well nourished female, NAD Psych: Normal affect. Neuro: Alert and oriented X 3. Moves all extremities spontaneously. HEENT: Normal  Neck: Supple without bruits, + JVD. Lungs:  Resp regular and unlabored, CTA. Heart: tachy no s3, s4, 2/6 systolic murmur. Abdomen: Soft, non-tender, non-distended, BS + x 4.  Extremities: No clubbing, cyanosis 1+ bilateral LE edema. DP/PT/Radials 2+ and equal bilaterally.  Labs    Troponin [facility] of Care Test)  Recent Labs  10/06/15 1216  TROPIPOC 0.00    Recent Labs  10/06/15 1145  TROPONINI <0.03   Lab Results  Component Value Date   WBC 6.1 10/06/2015   HGB 11.8* 10/06/2015   HCT 36.1 10/06/2015   MCV 90.3  10/06/2015   PLT 183 10/06/2015    Recent Labs Lab 10/06/15 1145  NA 134*  K 4.3  CL 102  CO2 25  BUN 20  CREATININE 0.93  CALCIUM 8.7*  GLUCOSE 110*   Lab Results  Component Value Date   CHOL 165 05/01/2015   HDL 85 05/01/2015   LDLCALC 68 05/01/2015   TRIG 59 05/01/2015   Lab Results  Component Value Date   DDIMER 0.86* 10/06/2015     Radiology Studies    Dg Chest 2 View  10/06/2015  CLINICAL DATA:  One week history of dyspnea. EXAM: CHEST  2 VIEW COMPARISON:  03/01/2012 FINDINGS: The heart is enlarged. There is tortuosity, ectasia and calcification of the thoracic aorta. The pulmonary hila appear normal. There are emphysematous and  chronic bronchitic type lung changes but no definite acute overlying pulmonary process. No definite edema, infiltrates or effusions. The bony thorax is intact. IMPRESSION: Cardiac enlargement and chronic lung changes but no definite acute overlying pulmonary process. Electronically Signed   By: Marijo Sanes M.D.   On: 10/06/2015 12:38   Ct Angio Chest Pe W/cm &/or Wo Cm  10/06/2015  CLINICAL DATA:  82 year old female with progressive shortness of breath on exertion EXAM: CT ANGIOGRAPHY CHEST WITH CONTRAST TECHNIQUE: Multidetector CT imaging of the chest was performed using the standard protocol during bolus administration of intravenous contrast. Multiplanar CT image reconstructions and MIPs were obtained to evaluate the vascular anatomy. CONTRAST:  80 mL Isovue 370 COMPARISON:  Prior chest x-ray 10/06/2015 FINDINGS: Mediastinum: Unremarkable CT appearance of the thyroid gland. No suspicious mediastinal or hilar adenopathy. No soft tissue mediastinal mass. The thoracic esophagus is unremarkable. Heart/Vascular: Adequate opacification of pulmonary arteries to the proximal subsegmental level. No evidence filling defect to suggest acute pulmonary embolus. The heart is enlarged, particularly the right atrium. No significant pericardial effusion.  Atherosclerotic calcifications present in the left anterior descending and circumflex coronary arteries. Normal caliber thoracic aorta. Aortic atherosclerotic calcifications are present. Lungs/Pleura: Small right and trace left pleural effusions. Mild interlobular septal thickening. Diffuse mild bronchial wall thickening. 4 mm nodular opacity in the periphery of the right lower lobe (56 of series 407). There are a few other scattered patchy foci of ground-glass attenuation opacity bilaterally. Bones/Soft Tissues: No acute fracture or aggressive appearing lytic or blastic osseous lesion. Upper Abdomen: Reflux of contrast material into the dilated suprahepatic IVC and hepatic veins. Otherwise, the visualized upper abdomen is unremarkable. Review of the MIP images confirms the above findings. IMPRESSION: 1. Negative for acute pulmonary embolus. 2. Cardiomegaly with predominantly right atrial enlargement and reflux of contrast material into the dilated suprahepatic IVC and hepatic veins suggests right heart failure. 3. Small right and trace left pleural effusions. 4. Mild interlobular septal thickening and scattered foci of patchy ground-glass suggest early interstitial pulmonary edema. 5. 4 mm right lower lobe ground-glass attenuation pulmonary nodule. No follow-up recommended. This recommendation follows the consensus statement: Guidelines for Management of Incidental Pulmonary Nodules Detected on CT Images:From the Fleischner Society 2017; published online before print (10.1148/radiol.IJ:2314499). 6. Coronary artery calcifications. 7.  Aortic Atherosclerosis (ICD10-170.0) 8. Diffuse mild bronchial wall thickening. Electronically Signed   By: Jacqulynn Cadet M.D.   On: 10/06/2015 15:11    ECG & Cardiac Imaging    EKG: New A-flutter Rate-120s  Echo: 01/2011  Study Conclusions  - Left ventricle: The cavity size was normal. Wall thickness was increased in a pattern of mild LVH. Systolic function was  normal. The estimated ejection fraction was in the range of 55% to 60%. Wall motion was normal; there were no regional wall motion abnormalities. Features are consistent with a pseudonormal left ventricular filling pattern, with concomitant abnormal relaxation and increased filling pressure (grade 2 diastolic dysfunction). - Aortic valve: There was mild stenosis. Mild regurgitation. - Mitral valve: Mild regurgitation. - Left atrium: The atrium was mildly dilated. - Right atrium: The atrium was mildly dilated. - Atrial septum: No defect or patent foramen ovale was identified. - Pulmonary arteries: PA peak pressure: 40mm Hg (S). - Pericardium, extracardiac: A trivial pericardial effusion was identified.  Assessment & Plan    Ms. Alydia Zeldin is a 82 yo female with PMH of HTN/PVCs/TIA/HLD/AS/DDD and OA. He was originally a patient of Dr.Brackbill and has transferred care to Dr. Stanford Breed.  She has a history of atypical chest pain and palpitations, which she was originally placed on Cardizem and metoprolol for when seen Dr. Mare Ferrari in the past. She had an exercise treadmill test on 01/26/11 which showed no evidence of ischemia. Last echocardiogram in 11/12 showed normal LV function, grade 2 diastolic dysfunction, mild aortic stenosis/insufficiency along with mild mitral regurgitation, and mild biatrial enlargement. She was last seen in the office by Dr. Stanford Breed on 09/16/2015 where she did report some palpitations, thought to be secondary to PACs and PVCs. Her Cardizem and metoprolol was continued at the current dose given she was noted to have first-degree AV block. Her blood pressure is noted to be elevated at that time and was currently taking to calcium channel blockers. Her amlodipine was discontinued and hydralazine was increased to 25 mg 3 times a day. Plan to repeat her 2-D echocardiogram to evaluate her aortic stenosis, but had not been completed yet.   1. Dyspnea: Pt  presented to the [facility] ED with 2-3 day hx of DOE. States that she has been having to stop and take breaks at home while doing daily activity. Denies being on scheduled lasix at home, but thinks that she may be given them PRN. Reports increased edema to bilateral LE and full sensation in her abd over the past couple of days. Chest x-ray negative for edema. D-dimer positive but CT chest negative for PE -- 2D echo from 2012 showed normal LV function with mild aortic stenosis, and regurg -- Likely secondary to new onset a-flutter, reports her weight at home runs around 128lbs -- Repeat 2D echo.  -- Will 60mg  IV lasix x1 and monitor I&Os, with daily weights.  2. New Atrial Flutter: Pt reports only ever being told that she has a hx of PVCs/PACs. Was started on Cardizem and metoprolol in the past for management. Denies having any palpitations today, but reports attempting to take her own pulse the past couple of days, and feeling like it was irregular. EKG appears to be a-flutter with rate 120s, no acute ST/ T wave abnormalities.  -- Currently on Cardizem and metoprolol for hx of PACs/PVCs -- Will stop hydralazine, and change metoprolol to 25mg  q6 for better rate control an possible conversion.  -- Renal Function is stable, would be a good candidate for [place]. Will start Eliquis -- May need TEE/DCCV if she does not convert on her own, given new symptoms of mild HF. Will make NPO tonight and reassess in early am.  -- This patients CHA2DS2-VASc Score and unadjusted Ischemic Stroke Rate (% per year) is equal to 9.7 % stroke rate/year from a score of 6 Above score calculated as 1 point each if present [CHF, HTN, DM, Vascular=MI/PAD/Aortic Plaque, Age if 65-74, or Female] Above score calculated as 2 points each if present [Age > 75, or Stroke/TIA/TE]  3. HLD: Continue Statin  4. HTN: Medications as above  Signed, Maitland Rolen, NP-C Pager [phone] 10/06/2015, 4:20 PM  Pt seen and examined  Agree  with findings as noted above by L Platner Pt 82 yo who presents with SOB and palpitatiosn  Found to be in atrial flutter   On exam:  Lungs: decreased BS at bases; Cardiac RRR  Tachy  No S3  Abdomen:  No hepatomegatly  Ext  Tr to 1+edema. Labs signif for increased BNP  Recomm:  Would get rate control with increased metoprolol and dilt [place] with lasix  Dose once and follow response Anticoagulate with Eliquis Echo in AM  to eval function

## 2015-10-06 NOTE — ED Notes (Signed)
Admitting at bedside 

## 2015-10-06 NOTE — Progress Notes (Signed)
Pt came from [facility] with DNR form. Pt is listed as full code in [place]. Paged PA Sharene Butters to make changes. Will continue to monitor.

## 2015-10-06 NOTE — ED Notes (Signed)
PT arrives via EMS from a assisted living facility. PT reports exertional SOB that started one week ago, but it has worsened over the past two days. PT denies chest pain or palpitations over the past week. PT had a recent change in medications. PT's metoprolol was increased. PT has been on lasix for the past two days. PT finished a course of antibiotics 2 weeks ago for a UTI.

## 2015-10-06 NOTE — ED Notes (Signed)
Cardiology at bedside.

## 2015-10-07 ENCOUNTER — Observation Stay (HOSPITAL_BASED_OUTPATIENT_CLINIC_OR_DEPARTMENT_OTHER): Payer: Medicare Other

## 2015-10-07 ENCOUNTER — Other Ambulatory Visit: Payer: Medicare Other

## 2015-10-07 ENCOUNTER — Other Ambulatory Visit (HOSPITAL_COMMUNITY): Payer: Medicare Other

## 2015-10-07 ENCOUNTER — Encounter (HOSPITAL_COMMUNITY): Payer: Self-pay | Admitting: Internal Medicine

## 2015-10-07 DIAGNOSIS — I4891 Unspecified atrial fibrillation: Secondary | ICD-10-CM | POA: Diagnosis present

## 2015-10-07 DIAGNOSIS — Z96651 Presence of right artificial knee joint: Secondary | ICD-10-CM | POA: Diagnosis present

## 2015-10-07 DIAGNOSIS — R0600 Dyspnea, unspecified: Secondary | ICD-10-CM | POA: Diagnosis not present

## 2015-10-07 DIAGNOSIS — Z888 Allergy status to other drugs, medicaments and biological substances status: Secondary | ICD-10-CM | POA: Diagnosis not present

## 2015-10-07 DIAGNOSIS — I119 Hypertensive heart disease without heart failure: Secondary | ICD-10-CM

## 2015-10-07 DIAGNOSIS — N390 Urinary tract infection, site not specified: Secondary | ICD-10-CM | POA: Diagnosis present

## 2015-10-07 DIAGNOSIS — I493 Ventricular premature depolarization: Secondary | ICD-10-CM

## 2015-10-07 DIAGNOSIS — I4892 Unspecified atrial flutter: Secondary | ICD-10-CM | POA: Diagnosis present

## 2015-10-07 DIAGNOSIS — G4700 Insomnia, unspecified: Secondary | ICD-10-CM

## 2015-10-07 DIAGNOSIS — I340 Nonrheumatic mitral (valve) insufficiency: Secondary | ICD-10-CM | POA: Diagnosis not present

## 2015-10-07 DIAGNOSIS — I472 Ventricular tachycardia: Secondary | ICD-10-CM

## 2015-10-07 DIAGNOSIS — R0602 Shortness of breath: Secondary | ICD-10-CM

## 2015-10-07 DIAGNOSIS — Z8673 Personal history of transient ischemic attack (TIA), and cerebral infarction without residual deficits: Secondary | ICD-10-CM | POA: Diagnosis not present

## 2015-10-07 DIAGNOSIS — Z882 Allergy status to sulfonamides status: Secondary | ICD-10-CM | POA: Diagnosis not present

## 2015-10-07 DIAGNOSIS — I5043 Acute on chronic combined systolic (congestive) and diastolic (congestive) heart failure: Secondary | ICD-10-CM | POA: Diagnosis present

## 2015-10-07 DIAGNOSIS — I5033 Acute on chronic diastolic (congestive) heart failure: Secondary | ICD-10-CM | POA: Diagnosis not present

## 2015-10-07 DIAGNOSIS — I5023 Acute on chronic systolic (congestive) heart failure: Secondary | ICD-10-CM | POA: Diagnosis not present

## 2015-10-07 DIAGNOSIS — R000 Tachycardia, unspecified: Secondary | ICD-10-CM | POA: Diagnosis present

## 2015-10-07 DIAGNOSIS — E7800 Pure hypercholesterolemia, unspecified: Secondary | ICD-10-CM | POA: Diagnosis present

## 2015-10-07 DIAGNOSIS — I080 Rheumatic disorders of both mitral and aortic valves: Secondary | ICD-10-CM | POA: Diagnosis present

## 2015-10-07 DIAGNOSIS — Z7982 Long term (current) use of aspirin: Secondary | ICD-10-CM | POA: Diagnosis not present

## 2015-10-07 DIAGNOSIS — I110 Hypertensive heart disease with heart failure: Secondary | ICD-10-CM | POA: Diagnosis present

## 2015-10-07 DIAGNOSIS — I471 Supraventricular tachycardia: Secondary | ICD-10-CM | POA: Diagnosis not present

## 2015-10-07 DIAGNOSIS — Z880 Allergy status to penicillin: Secondary | ICD-10-CM | POA: Diagnosis not present

## 2015-10-07 DIAGNOSIS — D649 Anemia, unspecified: Secondary | ICD-10-CM | POA: Diagnosis present

## 2015-10-07 DIAGNOSIS — I10 Essential (primary) hypertension: Secondary | ICD-10-CM

## 2015-10-07 DIAGNOSIS — Z79899 Other long term (current) drug therapy: Secondary | ICD-10-CM | POA: Diagnosis not present

## 2015-10-07 DIAGNOSIS — R0609 Other forms of dyspnea: Secondary | ICD-10-CM | POA: Diagnosis not present

## 2015-10-07 DIAGNOSIS — I272 Other secondary pulmonary hypertension: Secondary | ICD-10-CM | POA: Diagnosis present

## 2015-10-07 DIAGNOSIS — Z8249 Family history of ischemic heart disease and other diseases of the circulatory system: Secondary | ICD-10-CM | POA: Diagnosis not present

## 2015-10-07 DIAGNOSIS — I429 Cardiomyopathy, unspecified: Secondary | ICD-10-CM | POA: Diagnosis present

## 2015-10-07 DIAGNOSIS — I700 Atherosclerosis of aorta: Secondary | ICD-10-CM | POA: Diagnosis present

## 2015-10-07 DIAGNOSIS — E785 Hyperlipidemia, unspecified: Secondary | ICD-10-CM | POA: Diagnosis present

## 2015-10-07 LAB — COMPREHENSIVE METABOLIC PANEL
ALT: 58 U/L — ABNORMAL HIGH (ref 14–54)
AST: 44 U/L — ABNORMAL HIGH (ref 15–41)
Albumin: 3.4 g/dL — ABNORMAL LOW (ref 3.5–5.0)
Alkaline Phosphatase: 61 U/L (ref 38–126)
Anion gap: 10 (ref 5–15)
BUN: 21 mg/dL — ABNORMAL HIGH (ref 6–20)
CO2: 26 mmol/L (ref 22–32)
Calcium: 8.8 mg/dL — ABNORMAL LOW (ref 8.9–10.3)
Chloride: 99 mmol/L — ABNORMAL LOW (ref 101–111)
Creatinine, Ser: 0.98 mg/dL (ref 0.44–1.00)
GFR calc Af Amer: 58 mL/min — ABNORMAL LOW (ref >60)
GFR calc non Af Amer: 50 mL/min — ABNORMAL LOW (ref >60)
Glucose, Bld: 91 mg/dL (ref 65–99)
Potassium: 3.7 mmol/L (ref 3.5–5.1)
Sodium: 135 mmol/L (ref 135–145)
Total Bilirubin: 0.7 mg/dL (ref 0.3–1.2)
Total Protein: 6.1 g/dL — ABNORMAL LOW (ref 6.5–8.1)

## 2015-10-07 LAB — CBC
HCT: 35.6 % — ABNORMAL LOW (ref 36.0–46.0)
Hemoglobin: 11.6 g/dL — ABNORMAL LOW (ref 12.0–15.0)
MCH: 29.4 pg (ref 26.0–34.0)
MCHC: 32.6 g/dL (ref 30.0–36.0)
MCV: 90.4 fL (ref 78.0–100.0)
Platelets: 182 10*3/uL (ref 150–400)
RBC: 3.94 MIL/uL (ref 3.87–5.11)
RDW: 14.3 % (ref 11.5–15.5)
WBC: 5.6 10*3/uL (ref 4.0–10.5)

## 2015-10-07 LAB — ECHOCARDIOGRAM COMPLETE
Height: 61 in
Weight: 2123.2 oz

## 2015-10-07 LAB — MRSA PCR SCREENING: MRSA by PCR: NEGATIVE

## 2015-10-07 MED ORDER — FUROSEMIDE 10 MG/ML IJ SOLN
40.0000 mg | Freq: Once | INTRAMUSCULAR | Status: AC
  Administered 2015-10-07: 40 mg via INTRAVENOUS
  Filled 2015-10-07: qty 4

## 2015-10-07 MED ORDER — SODIUM CHLORIDE 0.9 % IV SOLN: 250.0000 mL | INTRAVENOUS | Status: DC

## 2015-10-07 MED ORDER — SODIUM CHLORIDE 0.9 % IV SOLN
INTRAVENOUS | Status: DC
  Administered 2015-10-08: 14:00:00 via INTRAVENOUS

## 2015-10-07 MED ORDER — METOPROLOL TARTRATE 5 MG/5ML IV SOLN
2.5000 mg | Freq: Once | INTRAVENOUS | Status: AC
  Administered 2015-10-07: 2.5 mg via INTRAVENOUS
  Filled 2015-10-07: qty 5

## 2015-10-07 MED ORDER — APIXABAN 2.5 MG PO TABS
2.5000 mg | ORAL_TABLET | Freq: Two times a day (BID) | ORAL | Status: DC
  Administered 2015-10-07 – 2015-10-11 (×8): 2.5 mg via ORAL
  Filled 2015-10-07 (×8): qty 1

## 2015-10-07 MED ORDER — SODIUM CHLORIDE 0.9% FLUSH
3.0000 mL | Freq: Two times a day (BID) | INTRAVENOUS | Status: DC
  Administered 2015-10-07 – 2015-10-11 (×4): 3 mL via INTRAVENOUS

## 2015-10-07 MED ORDER — DILTIAZEM HCL ER COATED BEADS 180 MG PO CP24
180.0000 mg | ORAL_CAPSULE | Freq: Every day | ORAL | Status: DC
  Administered 2015-10-08: 180 mg via ORAL
  Filled 2015-10-07: qty 1

## 2015-10-07 MED ORDER — SODIUM CHLORIDE 0.9% FLUSH: 3.0000 mL | INTRAVENOUS | Status: DC | PRN

## 2015-10-07 NOTE — Progress Notes (Signed)
Echocardiogram 2D Echocardiogram has been performed.  Glenda [name] 10/07/2015, 10:26 AM

## 2015-10-07 NOTE — Clinical Social Work Note (Signed)
Clinical Social Work Assessment  Patient Details  Name: Glenda [name] MRN: [id number] Date of Birth: [birth year]  Date of referral:  10/07/15               Reason for consult:  Discharge Planning                Permission sought to share information with:  Facility Sport and exercise psychologist, Family Supports Permission granted to share information::  Yes, Verbal Permission Granted  Name::     Reola Mosher  Agency::  [facility] [place]  Relationship::  Son  Contact Information:  [phone]  Housing/Transportation Living arrangements for the past 2 months:  [place] of Information:  Patient, Engineer, materials, Medical Team Patient Interpreter Needed:  None Criminal Activity/Legal Involvement Pertinent to Current Situation/Hospitalization:  No - Comment as needed Significant Relationships:  Adult Children, Friend Lives with:  Facility Resident Do you feel safe going back to the place where you live?  Yes Need for family participation in patient care:  Yes (Comment)  Care giving concerns:  Patient is from [place] in [place].   Social Worker assessment / plan:  CSW met with patient. Two supports at bedside. CSW introduced role and explained that discharge planning would be discussed. Patient confirmed that she is from [facility] and plans to return. One of patient's supports will likely be able to transport her back to facility. If not, CSW will schedule PTAR. No further concerns. CSW encouraged patient to contact CSW as needed. CSW will continue to follow patient and facilitate discharge back to ALF once medically stable.  Employment status:  Retired Forensic scientist:  Medicare PT Recommendations:  Not assessed at this time [place] / Referral to community resources:  Other (Comment Required) (Plan is to return to ALF.)  Patient/Family's Response to care:  Patient agreeable to return to ALF. Patient's family and friends are  supportive and involved in patient's care. Patient appreciated social work intervention.  Patient/Family's Understanding of and Emotional Response to Diagnosis, Current Treatment, and Prognosis:  Patient understands need to return to ALF. She appears happy with hospital care so far.  Emotional Assessment Appearance:  Appears stated age Attitude/Demeanor/Rapport:  Other (Pleasant) Affect (typically observed):  Accepting, Appropriate, Calm, Pleasant Orientation:  Oriented to Self, Oriented to Place, Oriented to  Time, Oriented to Situation Alcohol / Substance use:  Never Used Psych involvement (Current and /or in the community):  No (Comment)  Discharge Needs  Concerns to be addressed:  Care Coordination Readmission within the last 30 days:  No Current discharge risk:  None Barriers to Discharge:  No Barriers Identified   Candie Chroman, LCSW 10/07/2015, 3:08 PM

## 2015-10-07 NOTE — NC FL2 (Addendum)
[place] LEVEL OF CARE SCREENING TOOL     IDENTIFICATION  Patient Name: Glenda [name] Birthdate: [birth year] Sex: female Admission Date (Current Location): 10/06/2015  [facility] and [place] Number:  Herbalist and Address:  [facility] [place], [place] [address] [place], [place] [address]      Provider Number: [id number]  Attending Physician Name and Address:  Venetia Maxon Rama, MD  Relative Name and Phone Number:       Current Level of Care: Hospital Recommended Level of Care: [place] Prior Approval Number:    Date Approved/Denied:   PASRR Number:    Discharge Plan: Other (Comment) (ALF)    Current Diagnoses: Patient Active Problem List   Diagnosis Date Noted  . Diastolic CHF, acute on chronic (HCC) 10/07/2015  . Exertional dyspnea 10/06/2015  . Paroxysmal ventricular tachycardia ([place])   . Atrial flutter ([place])   . Malaise and fatigue 12/20/2014  . Shortness of breath 12/13/2014  . Palpitations 12/13/2014  . Vertigo 11/19/2012  . Unspecified constipation 11/06/2012  . Accelerated/malignant hypertension 09/14/2012  . Peripheral edema 07/11/2012  . Aortic stenosis, mild 07/11/2012  . Chronic diastolic HF (heart failure) ([place]) 07/11/2012  . Osteoarthritis 04/03/2012  . Heart murmur, aortic 02/29/2012  . Anemia 06/10/2011  . Hypertension 03/25/2011  . PVC (premature ventricular contraction) 02/16/2011  . Dizziness 12/01/2010  . Insomnia 09/15/2010  . Hypercholesterolemia 09/15/2010    Orientation RESPIRATION BLADDER Height & Weight     Self, Time, Situation, Place  Normal Continent Weight: 132 lb 11.2 oz (60.192 kg) (scale a) Height:  5\' 1"  (154.9 cm)  BEHAVIORAL SYMPTOMS/MOOD NEUROLOGICAL BOWEL NUTRITION STATUS   (None)  (None) Continent Diet (Heart healthy)  AMBULATORY STATUS COMMUNICATION OF NEEDS Skin   Limited Assist Verbally Normal                       Personal Care Assistance Level of  Assistance  Bathing, Feeding, Dressing Bathing Assistance: Independent Feeding assistance: Independent Dressing Assistance: Independent     Functional Limitations Info  Sight, Hearing, Speech Sight Info: Adequate Hearing Info: Adequate Speech Info: Adequate    SPECIAL CARE FACTORS FREQUENCY  Blood pressure          PT 3 x week OT 3 x week          Contractures Contractures Info: Not present    Additional Factors Info  Code Status, Allergies Code Status Info: DNR Allergies Info: Ace Inhibitors, Amitriptyline,  Avelox, Halcion, Pentazocine Lactate, Sulfa Drugs Cross Reactors, Trazodone And Nefazodone, Penicillins           Current Medications (10/07/2015):  This is the current hospital active medication list Current Facility-Administered Medications  Medication Dose Route Frequency Provider Last Rate Last Dose  . acetaminophen (TYLENOL) tablet 650 mg  650 mg Oral Q6H PRN Rondel Jumbo, PA-C       Or  . acetaminophen (TYLENOL) suppository 650 mg  650 mg Rectal Q6H PRN Rondel Jumbo, PA-C      . apixaban (ELIQUIS) tablet 5 mg  5 mg Oral BID Cheryln Manly, NP   5 mg at 10/07/15 1320  . atorvastatin (LIPITOR) tablet 20 mg  20 mg Oral Daily Waldemar Dickens, MD   20 mg at 10/07/15 1319  . bisacodyl (DULCOLAX) suppository 10 mg  10 mg Rectal Daily PRN Rondel Jumbo, PA-C      . [START ON 10/08/2015] diltiazem (CARDIZEM CD)  24 hr capsule 180 mg  180 mg Oral Q breakfast Fay Records, MD      . HYDROcodone-acetaminophen (NORCO/VICODIN) 5-325 MG per tablet 1-2 tablet  1-2 tablet Oral Q4H PRN Rondel Jumbo, PA-C   1 tablet at 10/07/15 1331  . magnesium citrate solution 1 Bottle  1 Bottle Oral Once PRN Rondel Jumbo, PA-C      . metoprolol tartrate (LOPRESSOR) tablet 25 mg  25 mg Oral Q6H Cheryln Manly, NP   25 mg at 10/07/15 1200  . morphine 2 MG/ML injection 1 mg  1 mg Intravenous Q4H PRN Rondel Jumbo, PA-C      . ondansetron [facility]) tablet 4 mg  4 mg Oral Q8H PRN  Rondel Jumbo, PA-C   4 mg at 10/07/15 0734  . pantoprazole (PROTONIX) EC tablet 40 mg  40 mg Oral Daily Rondel Jumbo, PA-C   40 mg at 10/07/15 1319  . senna-docusate (Senokot-S) tablet 1 tablet  1 tablet Oral QHS PRN Rondel Jumbo, PA-C      . sodium chloride flush (NS) 0.9 % injection 3 mL  3 mL Intravenous Q12H Rondel Jumbo, PA-C   3 mL at 10/07/15 1000  . temazepam (RESTORIL) capsule 15 mg  15 mg Oral QHS PRN Jeryl Columbia, NP   15 mg at 10/06/15 2352     Discharge Medications: Please see discharge summary for a list of discharge medications.  Relevant Imaging Results:  Relevant Lab Results:   Additional Information SS#: [id number]  Candie Chroman, LCSW

## 2015-10-07 NOTE — Care Management Obs Status (Signed)
[place] NOTIFICATION   Patient Details  Name: GENI LEEMON MRN: [id number] Date of Birth: 11-Feb-1925   Medicare Observation Status Notification Given:  Yes    Erenest Rasher, RN 10/07/2015, 1:54 PM

## 2015-10-07 NOTE — Progress Notes (Signed)
Subjective: Still SOB   Objective: Filed Vitals:   10/06/15 1600 10/06/15 1744 10/06/15 2046 10/07/15 0614  BP: 127/98 122/99 105/71 126/83  Pulse: 123 126 125 128  Temp:  98.2 F (36.8 C) 98 F (36.7 C) 97.6 F (36.4 C)  TempSrc:  Oral Oral Oral  Resp: 22 22 20 20   Height:  5\' 1"  (1.549 m)    Weight:  135 lb 8 oz (61.462 kg)  132 lb 11.2 oz (60.192 kg)  SpO2: 98% 97% 98% 95%   Weight change:   Intake/Output Summary (Last 24 hours) at 10/07/15 0754 Last data filed at 10/07/15 [id number]  Gross per 24 hour  Intake    100 ml  Output   1850 ml  Net  -1750 ml    General: Alert, awake, oriented x3, in no acute distress Neck:  JVP is normal Heart: Regular rate and rhythm, without murmurs, rubs, gallops.  Lungs: Clear to auscultation.  No rales or wheezes. Exemities:  Tr edema.   Neuro: Grossly intact, nonfocal.  Tele:  Atrial flutter with 2:1 AVB  HR 120s   Lab Results: Results for orders placed or performed during the hospital encounter of 10/06/15 (from the past 24 hour(s))  Basic metabolic panel     Status: Abnormal   Collection Time: 10/06/15 11:45 AM  Result Value Ref Range   Sodium 134 (L) 135 - 145 mmol/L   Potassium 4.3 3.5 - 5.1 mmol/L   Chloride 102 101 - 111 mmol/L   CO2 25 22 - 32 mmol/L   Glucose, Bld 110 (H) 65 - 99 mg/dL   BUN 20 6 - 20 mg/dL   Creatinine, Ser 0.93 0.44 - 1.00 mg/dL   Calcium 8.7 (L) 8.9 - 10.3 mg/dL   GFR calc non Af Amer 53 (L) >60 mL/min   GFR calc Af Amer >60 >60 mL/min   Anion gap 7 5 - 15  CBC WITH DIFFERENTIAL     Status: Abnormal   Collection Time: 10/06/15 11:45 AM  Result Value Ref Range   WBC 6.1 4.0 - 10.5 K/uL   RBC 4.00 3.87 - 5.11 MIL/uL   Hemoglobin 11.8 (L) 12.0 - 15.0 g/dL   HCT 36.1 36.0 - 46.0 %   MCV 90.3 78.0 - 100.0 fL   MCH 29.5 26.0 - 34.0 pg   MCHC 32.7 30.0 - 36.0 g/dL   RDW 14.3 11.5 - 15.5 %   Platelets 183 150 - 400 K/uL   Neutrophils Relative % 74 %   Neutro Abs 4.5 1.7 - 7.7 K/uL   Lymphocytes  Relative 17 %   Lymphs Abs 1.0 0.7 - 4.0 K/uL   Monocytes Relative 8 %   Monocytes Absolute 0.5 0.1 - 1.0 K/uL   Eosinophils Relative 1 %   Eosinophils Absolute 0.1 0.0 - 0.7 K/uL   Basophils Relative 0 %   Basophils Absolute 0.0 0.0 - 0.1 K/uL  Troponin I     Status: None   Collection Time: 10/06/15 11:45 AM  Result Value Ref Range   Troponin I <0.03 <0.03 ng/mL  Brain natriuretic peptide     Status: Abnormal   Collection Time: 10/06/15 11:45 AM  Result Value Ref Range   B Natriuretic Peptide 640.3 (H) 0.0 - 100.0 pg/mL  D-dimer, quantitative (not at [facility])     Status: Abnormal   Collection Time: 10/06/15 11:45 AM  Result Value Ref Range   D-Dimer, Quant 0.86 (H) 0.00 - 0.50 ug/mL-FEU  I-stat troponin, ED  Status: None   Collection Time: 10/06/15 12:16 PM  Result Value Ref Range   Troponin i, poc 0.00 0.00 - 0.08 ng/mL   Comment 3          Urinalysis, Routine w reflex microscopic (not at [facility])     Status: None   Collection Time: 10/06/15 12:41 PM  Result Value Ref Range   Color, Urine YELLOW YELLOW   APPearance CLEAR CLEAR   Specific Gravity, Urine 1.023 1.005 - 1.030   pH 5.5 5.0 - 8.0   Glucose, UA NEGATIVE NEGATIVE mg/dL   Hgb urine dipstick NEGATIVE NEGATIVE   Bilirubin Urine NEGATIVE NEGATIVE   Ketones, ur NEGATIVE NEGATIVE mg/dL   Protein, ur NEGATIVE NEGATIVE mg/dL   Nitrite NEGATIVE NEGATIVE   Leukocytes, UA NEGATIVE NEGATIVE  Comprehensive metabolic panel     Status: Abnormal   Collection Time: 10/07/15  4:15 AM  Result Value Ref Range   Sodium 135 135 - 145 mmol/L   Potassium 3.7 3.5 - 5.1 mmol/L   Chloride 99 (L) 101 - 111 mmol/L   CO2 26 22 - 32 mmol/L   Glucose, Bld 91 65 - 99 mg/dL   BUN 21 (H) 6 - 20 mg/dL   Creatinine, Ser 0.98 0.44 - 1.00 mg/dL   Calcium 8.8 (L) 8.9 - 10.3 mg/dL   Total Protein 6.1 (L) 6.5 - 8.1 g/dL   Albumin 3.4 (L) 3.5 - 5.0 g/dL   AST 44 (H) 15 - 41 U/L   ALT 58 (H) 14 - 54 U/L   Alkaline Phosphatase 61 38 - 126 U/L    Total Bilirubin 0.7 0.3 - 1.2 mg/dL   GFR calc non Af Amer 50 (L) >60 mL/min   GFR calc Af Amer 58 (L) >60 mL/min   Anion gap 10 5 - 15  CBC     Status: Abnormal   Collection Time: 10/07/15  4:15 AM  Result Value Ref Range   WBC 5.6 4.0 - 10.5 K/uL   RBC 3.94 3.87 - 5.11 MIL/uL   Hemoglobin 11.6 (L) 12.0 - 15.0 g/dL   HCT 35.6 (L) 36.0 - 46.0 %   MCV 90.4 78.0 - 100.0 fL   MCH 29.4 26.0 - 34.0 pg   MCHC 32.6 30.0 - 36.0 g/dL   RDW 14.3 11.5 - 15.5 %   Platelets 182 150 - 400 K/uL    Studies/Results: Dg Chest 2 View  10/06/2015  CLINICAL DATA:  One week history of dyspnea. EXAM: CHEST  2 VIEW COMPARISON:  03/01/2012 FINDINGS: The heart is enlarged. There is tortuosity, ectasia and calcification of the thoracic aorta. The pulmonary hila appear normal. There are emphysematous and chronic bronchitic type lung changes but no definite acute overlying pulmonary process. No definite edema, infiltrates or effusions. The bony thorax is intact. IMPRESSION: Cardiac enlargement and chronic lung changes but no definite acute overlying pulmonary process. Electronically Signed   By: Marijo Sanes M.D.   On: 10/06/2015 12:38   Ct Angio Chest Pe W/cm &/or Wo Cm  10/06/2015  CLINICAL DATA:  82 year old female with progressive shortness of breath on exertion EXAM: CT ANGIOGRAPHY CHEST WITH CONTRAST TECHNIQUE: Multidetector CT imaging of the chest was performed using the standard protocol during bolus administration of intravenous contrast. Multiplanar CT image reconstructions and MIPs were obtained to evaluate the vascular anatomy. CONTRAST:  80 mL Isovue 370 COMPARISON:  Prior chest x-ray 10/06/2015 FINDINGS: Mediastinum: Unremarkable CT appearance of the thyroid gland. No suspicious mediastinal or hilar adenopathy. No  soft tissue mediastinal mass. The thoracic esophagus is unremarkable. Heart/Vascular: Adequate opacification of pulmonary arteries to the proximal subsegmental level. No evidence filling defect to  suggest acute pulmonary embolus. The heart is enlarged, particularly the right atrium. No significant pericardial effusion. Atherosclerotic calcifications present in the left anterior descending and circumflex coronary arteries. Normal caliber thoracic aorta. Aortic atherosclerotic calcifications are present. Lungs/Pleura: Small right and trace left pleural effusions. Mild interlobular septal thickening. Diffuse mild bronchial wall thickening. 4 mm nodular opacity in the periphery of the right lower lobe (56 of series 407). There are a few other scattered patchy foci of ground-glass attenuation opacity bilaterally. Bones/Soft Tissues: No acute fracture or aggressive appearing lytic or blastic osseous lesion. Upper Abdomen: Reflux of contrast material into the dilated suprahepatic IVC and hepatic veins. Otherwise, the visualized upper abdomen is unremarkable. Review of the MIP images confirms the above findings. IMPRESSION: 1. Negative for acute pulmonary embolus. 2. Cardiomegaly with predominantly right atrial enlargement and reflux of contrast material into the dilated suprahepatic IVC and hepatic veins suggests right heart failure. 3. Small right and trace left pleural effusions. 4. Mild interlobular septal thickening and scattered foci of patchy ground-glass suggest early interstitial pulmonary edema. 5. 4 mm right lower lobe ground-glass attenuation pulmonary nodule. No follow-up recommended. This recommendation follows the consensus statement: Guidelines for Management of Incidental Pulmonary Nodules Detected on CT Images:From the Fleischner Society 2017; published online before print (10.1148/radiol.SG:5268862). 6. Coronary artery calcifications. 7.  Aortic Atherosclerosis (ICD10-170.0) 8. Diffuse mild bronchial wall thickening. Electronically Signed   By: Jacqulynn Cadet M.D.   On: 10/06/2015 15:11    Medications: Reviewed     @PROBHOSP @  1  Dyspnea  Persists   Pt diuresed some by she is still in  atrial flutter   WIll push meds but also arrange for TEE cardioversion tomorrow at 2 PM    2.  Atrial flutter  Persists  Rates have not changed much  WIll advance meds but plan on TEE cardioversion tomorrow   Echo today to define chamber sizes      3  HL  Continue statin    4  HTN  Follow    Dorris Carnes 10/07/2015, 7:54 AM

## 2015-10-07 NOTE — Progress Notes (Signed)
Progress Note    Glenda [name]  [id number] DOB: December 12, 1924  DOA: 10/06/2015 PCP: Warren Danes, MD    Brief Narrative:   Glenda [name] is an 82 y.o. female with a PMH of hypertension, paresthesias, TIA, hyperlipidemia and aortic stenosis who was admitted 10/06/15 with atrial flutter.  Assessment/Plan:   Principal Problem:   Atrial flutter (HCC) with exertional dyspnea secondary to acute on chronic diastolic CHF in the setting of paroxysmal ventricular tachycardia with PVCs Exertional dyspnea likely from acute on chronic diastolic CHF in the setting of tachycardia. D-dimer mildly elevated, but CT negative for pulmonary embolism. Repeat echocardiogram pending. Given a dose of Lasix on admission. I/O balance -1.7 L. Weight down 3 pounds. Troponin not elevated. EKG appears to be a-flutter with rate 120s, no acute ST/ T wave abnormalities. Continue Cardizem and increased dosed metoprolol. CHA2DS2-VASc Score = 6. Ellquis started.TEE/DCCV Planned for 10/08/15. Given an additional dose of Lasix today.  Active Problems:   Insomnia Restoril ordered as needed.    Hypercholesterolemia Continue statin.    Hypertension Hydralazine discontinued. Continue Cardizem and increased dose metoprolol.   Family Communication/Anticipated D/C date and plan/Code Status   DVT prophylaxis: Lovenox ordered. Code Status: Full Code.  Family Communication: Friend at the bedside. No family present. Disposition Plan: From an assisted living facility. Likely will be stable to go after cardioversion on 10/09/15.   Medical Consultants:    Cardiology   Procedures:    Anti-Infectives:   None.  Subjective:    Glenda [name] is still short of breath. No frank chest pain. No nausea/vomiting.  Objective:    Filed Vitals:   10/06/15 1600 10/06/15 1744 10/06/15 2046 10/07/15 0614  BP: 127/98 122/99 105/71 126/83  Pulse: 123 126 125 128  Temp:  98.2 F (36.8 C) 98 F (36.7 C) 97.6 F  (36.4 C)  TempSrc:  Oral Oral Oral  Resp: 22 22 20 20   Height:  5\' 1"  (1.549 m)    Weight:  61.462 kg (135 lb 8 oz)  60.192 kg (132 lb 11.2 oz)  SpO2: 98% 97% 98% 95%    Intake/Output Summary (Last 24 hours) at 10/07/15 0758 Last data filed at 10/07/15 0619  Gross per 24 hour  Intake    100 ml  Output   1850 ml  Net  -1750 ml   Filed Weights   10/06/15 1114 10/06/15 1744 10/07/15 0614  Weight: 58.06 kg (128 lb) 61.462 kg (135 lb 8 oz) 60.192 kg (132 lb 11.2 oz)    Exam: General exam: Appears calm and comfortable.  Respiratory system: Clear to auscultation. Respiratory effort increased. Cardiovascular system: S1 & S2 heard, tachycardic rate. No JVD,  rubs, gallops or clicks. No murmurs. Gastrointestinal system: Abdomen is nondistended, soft and nontender. No organomegaly or masses felt. Normal bowel sounds heard. Central nervous system: Alert and oriented. No focal neurological deficits. Extremities: Trace edema. Skin: No rashes, lesions or ulcers Psychiatry: Judgement and insight appear normal. Mood & affect appropriate.   Data Reviewed:   I have personally reviewed following labs and imaging studies:  Labs: Basic Metabolic Panel:  Recent Labs Lab 10/06/15 1145 10/07/15 0415  NA 134* 135  K 4.3 3.7  CL 102 99*  CO2 25 26  GLUCOSE 110* 91  BUN 20 21*  CREATININE 0.93 0.98  CALCIUM 8.7* 8.8*   GFR Estimated Creatinine Clearance: 33.1 mL/min (by C-G formula based on Cr of 0.98). Liver Function Tests:  Recent Labs Lab 10/07/15  0415  AST 44*  ALT 58*  ALKPHOS 61  BILITOT 0.7  PROT 6.1*  ALBUMIN 3.4*   CBC:  Recent Labs Lab 10/06/15 1145 10/07/15 0415  WBC 6.1 5.6  NEUTROABS 4.5  --   HGB 11.8* 11.6*  HCT 36.1 35.6*  MCV 90.3 90.4  PLT 183 182   Cardiac Enzymes:  Recent Labs Lab 10/06/15 1145  TROPONINI <0.03   D-Dimer:  Recent Labs  10/06/15 1145  DDIMER 0.86*   Microbiology No results found for this or any previous visit (from  the past 240 hour(s)).  Radiology: Dg Chest 2 View  10/06/2015  CLINICAL DATA:  One week history of dyspnea. EXAM: CHEST  2 VIEW COMPARISON:  03/01/2012 FINDINGS: The heart is enlarged. There is tortuosity, ectasia and calcification of the thoracic aorta. The pulmonary hila appear normal. There are emphysematous and chronic bronchitic type lung changes but no definite acute overlying pulmonary process. No definite edema, infiltrates or effusions. The bony thorax is intact. IMPRESSION: Cardiac enlargement and chronic lung changes but no definite acute overlying pulmonary process. Electronically Signed   By: Marijo Sanes M.D.   On: 10/06/2015 12:38   Ct Angio Chest Pe W/cm &/or Wo Cm  10/06/2015  CLINICAL DATA:  82 year old female with progressive shortness of breath on exertion EXAM: CT ANGIOGRAPHY CHEST WITH CONTRAST TECHNIQUE: Multidetector CT imaging of the chest was performed using the standard protocol during bolus administration of intravenous contrast. Multiplanar CT image reconstructions and MIPs were obtained to evaluate the vascular anatomy. CONTRAST:  80 mL Isovue 370 COMPARISON:  Prior chest x-ray 10/06/2015 FINDINGS: Mediastinum: Unremarkable CT appearance of the thyroid gland. No suspicious mediastinal or hilar adenopathy. No soft tissue mediastinal mass. The thoracic esophagus is unremarkable. Heart/Vascular: Adequate opacification of pulmonary arteries to the proximal subsegmental level. No evidence filling defect to suggest acute pulmonary embolus. The heart is enlarged, particularly the right atrium. No significant pericardial effusion. Atherosclerotic calcifications present in the left anterior descending and circumflex coronary arteries. Normal caliber thoracic aorta. Aortic atherosclerotic calcifications are present. Lungs/Pleura: Small right and trace left pleural effusions. Mild interlobular septal thickening. Diffuse mild bronchial wall thickening. 4 mm nodular opacity in the periphery  of the right lower lobe (56 of series 407). There are a few other scattered patchy foci of ground-glass attenuation opacity bilaterally. Bones/Soft Tissues: No acute fracture or aggressive appearing lytic or blastic osseous lesion. Upper Abdomen: Reflux of contrast material into the dilated suprahepatic IVC and hepatic veins. Otherwise, the visualized upper abdomen is unremarkable. Review of the MIP images confirms the above findings. IMPRESSION: 1. Negative for acute pulmonary embolus. 2. Cardiomegaly with predominantly right atrial enlargement and reflux of contrast material into the dilated suprahepatic IVC and hepatic veins suggests right heart failure. 3. Small right and trace left pleural effusions. 4. Mild interlobular septal thickening and scattered foci of patchy ground-glass suggest early interstitial pulmonary edema. 5. 4 mm right lower lobe ground-glass attenuation pulmonary nodule. No follow-up recommended. This recommendation follows the consensus statement: Guidelines for Management of Incidental Pulmonary Nodules Detected on CT Images:From the Fleischner Society 2017; published online before print (10.1148/radiol.IJ:2314499). 6. Coronary artery calcifications. 7.  Aortic Atherosclerosis (ICD10-170.0) 8. Diffuse mild bronchial wall thickening. Electronically Signed   By: Jacqulynn Cadet M.D.   On: 10/06/2015 15:11    Medications:   . apixaban  5 mg Oral BID  . atorvastatin  20 mg Oral Daily  . diltiazem  120 mg Oral Q breakfast  . metoprolol tartrate  25 mg Oral Q6H  . pantoprazole  40 mg Oral Daily  . sodium chloride flush  3 mL Intravenous Q12H   Continuous Infusions:   Time spent: 35 minutes with > 50% of time discussing current diagnostic test results, clinical impression and plan of care.     [facility] Pager [phone]. If unable to reach me by pager, please call my cell phone at [phone].  *Please refer to [email or web address], password [email or web address] to get updated  schedule on who will round on this patient, as hospitalists switch teams weekly. If 7PM-7AM, please contact night-coverage at [email or web address], password [email or web address] for any overnight needs.  10/07/2015, 7:58 AM

## 2015-10-07 NOTE — Care Management Note (Addendum)
Case Management Note  Patient Details  Name: Glenda [name] MRN: [id number] Date of Birth: 1924/09/04  Subjective/Objective:   Atrial flutter, dyspnea                 Action/Plan: Discharge Planning: NCM spoke to pt at bedside. She lives at [place]. She has RW, Rollator and cane. States ALF provides PT if needed. CSW following for ALF. Will continue to follow for dc needs.   PCP-Thomas Brackbill MD  10/08/2015 NCM spoke to [facility] in regards to medication, Eliquis. They order her medications at the ALF.   S/W [facility] @ [facility] # [phone]   ELIQUIS 2.5 MG BID (30 ) 60 TAB  COVER- YES  CO-PAY - $ 35.00 TO $45.00  TIER- 3 DRUG  PRIOR APPROVAL- YES # [phone]  PHARMACY : [facility]   AND [facility]    Expected Discharge Date:  10/09/2015              Expected Discharge Plan:  Assisted Living / Rest Home  In-House Referral:  NA  Discharge planning Services  CM Consult  Post Acute Care Choice:  NA Choice offered to:  NA  DME Arranged:  N/A (RW, Rollator, transport chair, cane) DME Agency:  NA  HH Arranged:  NA HH Agency:  NA  Status of Service:  Completed, signed off  If discussed at [place] of Stay Meetings, dates discussed:    Additional Comments:  Erenest Rasher, RN 10/07/2015, 2:04 PM

## 2015-10-07 NOTE — Progress Notes (Signed)
Pt has received both PO 25 mg Metoprolol and IV 2.5 mg Metoprolol. Prior to administration of both doses HR 127-129. HR still sustaining at 128 after both doses. Paged Dr. Rockne Menghini to make her aware. Will continue to monitor patient.

## 2015-10-08 ENCOUNTER — Inpatient Hospital Stay (HOSPITAL_COMMUNITY): Payer: Medicare Other

## 2015-10-08 ENCOUNTER — Inpatient Hospital Stay (HOSPITAL_COMMUNITY): Payer: Medicare Other | Admitting: Certified Registered Nurse Anesthetist

## 2015-10-08 ENCOUNTER — Encounter (HOSPITAL_COMMUNITY)
Admission: EM | Disposition: A | Discharge status: Nursing Home (Includes ICF) | Payer: Self-pay | Source: Home / Self Care | Attending: Internal Medicine

## 2015-10-08 ENCOUNTER — Encounter (HOSPITAL_COMMUNITY): Payer: Self-pay | Admitting: Certified Registered Nurse Anesthetist

## 2015-10-08 DIAGNOSIS — I4892 Unspecified atrial flutter: Secondary | ICD-10-CM

## 2015-10-08 DIAGNOSIS — I5023 Acute on chronic systolic (congestive) heart failure: Secondary | ICD-10-CM

## 2015-10-08 DIAGNOSIS — I340 Nonrheumatic mitral (valve) insufficiency: Secondary | ICD-10-CM

## 2015-10-08 HISTORY — PX: CARDIOVERSION: SHX1299

## 2015-10-08 HISTORY — PX: TEE WITHOUT CARDIOVERSION: SHX5443

## 2015-10-08 SURGERY — ECHOCARDIOGRAM, TRANSESOPHAGEAL
Anesthesia: Monitor Anesthesia Care

## 2015-10-08 MED ORDER — LIDOCAINE HCL (CARDIAC) 20 MG/ML IV SOLN
INTRAVENOUS | Status: DC | PRN
  Administered 2015-10-08 (×3): 20 mg via INTRAVENOUS

## 2015-10-08 MED ORDER — PROPOFOL 10 MG/ML IV BOLUS
INTRAVENOUS | Status: DC | PRN
  Administered 2015-10-08 (×3): 10 mg via INTRAVENOUS

## 2015-10-08 MED ORDER — ONDANSETRON HCL 4 MG/2ML IJ SOLN
4.0000 mg | Freq: Four times a day (QID) | INTRAMUSCULAR | Status: DC | PRN
  Administered 2015-10-08: 4 mg via INTRAVENOUS
  Filled 2015-10-08: qty 2

## 2015-10-08 MED ORDER — BUTAMBEN-TETRACAINE-BENZOCAINE 2-2-14 % EX AERO
INHALATION_SPRAY | CUTANEOUS | Status: DC | PRN
  Administered 2015-10-08: 2 via TOPICAL

## 2015-10-08 MED ORDER — PROPOFOL 500 MG/50ML IV EMUL
INTRAVENOUS | Status: DC | PRN
  Administered 2015-10-08: 100 ug/kg/min via INTRAVENOUS

## 2015-10-08 NOTE — Transfer of Care (Signed)
Immediate Anesthesia Transfer of Care Note  Patient: Glenda [name]  Procedure(s) Performed: Procedure(s): TRANSESOPHAGEAL ECHOCARDIOGRAM (TEE) (N/A) CARDIOVERSION (N/A)  Patient Location: Endoscopy Unit  Anesthesia Type:MAC  Level of Consciousness: awake, alert , oriented and patient cooperative  Airway & Oxygen Therapy: Patient Spontanous Breathing and Patient connected to nasal cannula oxygen  Post-op Assessment: Report given to RN and Post -op Vital signs reviewed and stable  Post vital signs: Reviewed and stable  Last Vitals:  Filed Vitals:   10/08/15 1134 10/08/15 1343  BP: 116/78 127/99  Pulse: 127 126  Temp: 36.8 C   Resp: 18 22    Last Pain:  Filed Vitals:   10/08/15 1344  PainSc: 0-No pain         Complications: No apparent anesthesia complications

## 2015-10-08 NOTE — Progress Notes (Signed)
Patient Name: Glenda [name] Date of Encounter: 10/08/2015   SUBJECTIVE  Still short of breath. No chest pain.   CURRENT MEDS . apixaban  2.5 mg Oral BID  . atorvastatin  20 mg Oral Daily  . diltiazem  180 mg Oral Q breakfast  . metoprolol tartrate  25 mg Oral Q6H  . pantoprazole  40 mg Oral Daily  . sodium chloride flush  3 mL Intravenous Q12H  . sodium chloride flush  3 mL Intravenous Q12H    OBJECTIVE  Filed Vitals:   10/07/15 1718 10/07/15 2007 10/07/15 2315 10/08/15 0628  BP: 109/81 107/88 107/83 115/87  Pulse: 129 128 126 128  Temp:  98.4 F (36.9 C)  98.2 F (36.8 C)  TempSrc:  Oral  Oral  Resp:  20  20  Height:      Weight:    130 lb 8 oz (59.194 kg)  SpO2:  95%  94%    Intake/Output Summary (Last 24 hours) at 10/08/15 1054 Last data filed at 10/08/15 1019  Gross per 24 hour  Intake    306 ml  Output   2250 ml  Net  -1944 ml   Filed Weights   10/06/15 1744 10/07/15 0614 10/08/15 0628  Weight: 135 lb 8 oz (61.462 kg) 132 lb 11.2 oz (60.192 kg) 130 lb 8 oz (59.194 kg)    PHYSICAL EXAM  General: Pleasant, NAD. Neuro: Alert and oriented X 3. Moves all extremities spontaneously. Psych: Normal affect. HEENT:  Normal  Neck: Supple without bruits or JVD. Lungs:  Resp regular and unlabored, CTA. Heart: RR with tachycardia no s3, s4, or murmurs. Abdomen: Soft, non-tender, non-distended, BS + x 4.  Extremities: No clubbing, cyanosis. Trace BL LE edema. DP/PT/Radials 2+ and equal bilaterally.  Accessory Clinical Findings  CBC  Recent Labs  10/06/15 1145 10/07/15 0415  WBC 6.1 5.6  NEUTROABS 4.5  --   HGB 11.8* 11.6*  HCT 36.1 35.6*  MCV 90.3 90.4  PLT 183 [id number]   Basic Metabolic Panel  Recent Labs  10/06/15 1145 10/07/15 0415  NA 134* 135  K 4.3 3.7  CL 102 99*  CO2 25 26  GLUCOSE 110* 91  BUN 20 21*  CREATININE 0.93 0.98  CALCIUM 8.7* 8.8*   Liver Function Tests  Recent Labs  10/07/15 0415  AST 44*  ALT 58*  ALKPHOS 61    BILITOT 0.7  PROT 6.1*  ALBUMIN 3.4*   No results for input(s): LIPASE, AMYLASE in the last 72 hours. Cardiac Enzymes  Recent Labs  10/06/15 1145  TROPONINI <0.03   BNP Invalid input(s): POCBNP D-Dimer  Recent Labs  10/06/15 1145  DDIMER 0.86*   Hemoglobin A1C No results for input(s): HGBA1C in the last 72 hours. Fasting Lipid Panel No results for input(s): CHOL, HDL, LDLCALC, TRIG, CHOLHDL, LDLDIRECT in the last 72 hours. Thyroid Function Tests No results for input(s): TSH, T4TOTAL, T3FREE, THYROIDAB in the last 72 hours.  Invalid input(s): FREET3  TELE  Aflutter at rate of 120s  Echo 10/07/15 LV EF: 40% - 45%  ------------------------------------------------------------------- Indications: Atrial flutter 427.32.  ------------------------------------------------------------------- History: PMH: Transient ischemic attack. Risk factors: Hypertension. Hypercholesterolemia.  ------------------------------------------------------------------- Study Conclusions  - Left ventricle: The cavity size was normal. There was mild  concentric hypertrophy. Systolic function was mildly to  moderately reduced. The estimated ejection fraction was in the  range of 40% to 45%. Diffuse hypokinesis. Normal sinus rhythm was  absent. The study is not technically sufficient to  allow  evaluation of LV diastolic function. Doppler parameters are  consistent with high ventricular filling pressure. - Aortic valve: Moderately calcified annulus. Trileaflet. Moderate  diffuse thickening and calcification, consistent with sclerosis.  There was trivial regurgitation. - Mitral valve: Moderate diffuse thickening of the anterior  leaflet. There was mild to moderate regurgitation. - Right atrium: The atrium was mildly dilated. - Pulmonary arteries: PA peak pressure: 42 mm Hg (S). - Pericardium, extracardiac: A trivial pericardial effusion was  identified posterior  to the heart.  Impressions:  - The right ventricular systolic pressure was increased consistent  with moderate pulmonary hypertension.   Radiology/Studies  Dg Chest 2 View  10/06/2015  CLINICAL DATA:  One week history of dyspnea. EXAM: CHEST  2 VIEW COMPARISON:  03/01/2012 FINDINGS: The heart is enlarged. There is tortuosity, ectasia and calcification of the thoracic aorta. The pulmonary hila appear normal. There are emphysematous and chronic bronchitic type lung changes but no definite acute overlying pulmonary process. No definite edema, infiltrates or effusions. The bony thorax is intact. IMPRESSION: Cardiac enlargement and chronic lung changes but no definite acute overlying pulmonary process. Electronically Signed   By: Marijo Sanes M.D.   On: 10/06/2015 12:38   Ct Angio Chest Pe W/cm &/or Wo Cm  10/06/2015  CLINICAL DATA:  82 year old female with progressive shortness of breath on exertion EXAM: CT ANGIOGRAPHY CHEST WITH CONTRAST TECHNIQUE: Multidetector CT imaging of the chest was performed using the standard protocol during bolus administration of intravenous contrast. Multiplanar CT image reconstructions and MIPs were obtained to evaluate the vascular anatomy. CONTRAST:  80 mL Isovue 370 COMPARISON:  Prior chest x-ray 10/06/2015 FINDINGS: Mediastinum: Unremarkable CT appearance of the thyroid gland. No suspicious mediastinal or hilar adenopathy. No soft tissue mediastinal mass. The thoracic esophagus is unremarkable. Heart/Vascular: Adequate opacification of pulmonary arteries to the proximal subsegmental level. No evidence filling defect to suggest acute pulmonary embolus. The heart is enlarged, particularly the right atrium. No significant pericardial effusion. Atherosclerotic calcifications present in the left anterior descending and circumflex coronary arteries. Normal caliber thoracic aorta. Aortic atherosclerotic calcifications are present. Lungs/Pleura: Small right and trace left  pleural effusions. Mild interlobular septal thickening. Diffuse mild bronchial wall thickening. 4 mm nodular opacity in the periphery of the right lower lobe (56 of series 407). There are a few other scattered patchy foci of ground-glass attenuation opacity bilaterally. Bones/Soft Tissues: No acute fracture or aggressive appearing lytic or blastic osseous lesion. Upper Abdomen: Reflux of contrast material into the dilated suprahepatic IVC and hepatic veins. Otherwise, the visualized upper abdomen is unremarkable. Review of the MIP images confirms the above findings. IMPRESSION: 1. Negative for acute pulmonary embolus. 2. Cardiomegaly with predominantly right atrial enlargement and reflux of contrast material into the dilated suprahepatic IVC and hepatic veins suggests right heart failure. 3. Small right and trace left pleural effusions. 4. Mild interlobular septal thickening and scattered foci of patchy ground-glass suggest early interstitial pulmonary edema. 5. 4 mm right lower lobe ground-glass attenuation pulmonary nodule. No follow-up recommended. This recommendation follows the consensus statement: Guidelines for Management of Incidental Pulmonary Nodules Detected on CT Images:From the Fleischner Society 2017; published online before print (10.1148/radiol.IJ:2314499). 6. Coronary artery calcifications. 7.  Aortic Atherosclerosis (ICD10-170.0) 8. Diffuse mild bronchial wall thickening. Electronically Signed   By: Jacqulynn Cadet M.D.   On: 10/06/2015 15:11    ASSESSMENT AND PLAN  1. Now onset atrial flutter with RVR - For TEE/DCCV today. Low dose Eliquis for anticoagulation. On Cardizem CD 180mg   and lopressore 25mg  q 6 hours. Adjusted post DCCV. Echo showed LV Ef of 40-45%, mild concentric hypertrophy, diffuse hypokinesis, aortic sclerosis, mild to moderate MR, pulmonary hypertension. EF was 55-60% on echo 01/2011. Cardiomyopathy likely rate related however has diffuse hypokinesis. Will discuss further  management with MD post TEE.   2. Dyspnea - Likely rate related. Diuresed total negative 3.6 L. No diuretics schedule for today. Trace BL LE edema. Lungs clear. Echo as above.     Insomnia   Hypercholesterolemia   PVC (premature ventricular contraction)   Hypertension   Anemia   Shortness of breath   Exertional dyspnea   Paroxysmal ventricular tachycardia (HCC)   Diastolic CHF, acute on chronic ([place])      Signed, Bhagat,Bhavinkumar PA-C Pager [phone]  Patient seen and examined  Agree with findings as noted by B Bhagat above On examL  Lungs Rel clear  Cardiac RRR  Tachy Ext without edema Has diuresed.some  Still SOB   Plan for TEE/cardioversion today   Keep on same meds for now   Dorris Carnes

## 2015-10-08 NOTE — Progress Notes (Signed)
Echocardiogram Echocardiogram Transesophageal has been performed.  Glenda [name] 10/08/2015, 4:04 PM

## 2015-10-08 NOTE — Progress Notes (Signed)
Pt complaining of nausea after receiving PO zofran at 0641. Patient scheduled for TEE/DCCV at 1400 today. Paged attending. Will continue to monitor.

## 2015-10-08 NOTE — Progress Notes (Signed)
OT Cancellation Note  Patient Details Name: Glenda [name] MRN: [id number] DOB: Aug 15, 1924   Cancelled Treatment:    Reason Eval/Treat Not Completed: Medical issues which prohibited therapy - sustained RHR 127.  Will reattempt tomorrow.   Justice Milliron [place], OTR/L [id number]   Lucille Passy M 10/08/2015, 10:59 AM

## 2015-10-08 NOTE — Op Note (Signed)
INDICATIONS: atrial flutter  PROCEDURE:   Informed consent was obtained prior to the procedure. The risks, benefits and alternatives for the procedure were discussed and the patient comprehended these risks.  Risks include, but are not limited to, cough, sore throat, vomiting, nausea, somnolence, esophageal and stomach trauma or perforation, bleeding, low blood pressure, aspiration, pneumonia, infection, trauma to the teeth and death.    After a procedural time-out, the oropharynx was anesthetized with 20% benzocaine spray.   During this procedure the patient was administered IV propofol by Anesthesiology (Dr. Smith Robert) for sedation.  The patient's heart rate, blood pressure, and oxygen saturationweare monitored continuously during the procedure.   The transesophageal probe was inserted in the esophagus and stomach without difficulty and multiple views were obtained.  The patient was kept under observation until the patient left the procedure room.  The patient left the procedure room in stable condition.   Agitated microbubble saline contrast was not administered.  COMPLICATIONS:    There were no immediate complications.  FINDINGS:  The rhythm is atrial tachycardia with 1:1 AV conduction, judging by atrial appendage flow velocities. No thrombus is seen. LVEF 30-35% due to global hypokinesis. Moderate mitral insufficiency with a central jet. Mild aortic atherosclerosis.  RECOMMENDATIONS:    Proceed with DCCV.  Time Spent Directly with the Patient:  30 minutes   Glenda [name] 10/08/2015, 2:24 PM

## 2015-10-08 NOTE — Anesthesia Postprocedure Evaluation (Signed)
Anesthesia Post Note  Patient: Glenda [name]  Procedure(s) Performed: Procedure(s) (LRB): TRANSESOPHAGEAL ECHOCARDIOGRAM (TEE) (N/A) CARDIOVERSION (N/A)  Patient location during evaluation: PACU Anesthesia Type: MAC Level of consciousness: awake and alert Pain management: pain level controlled Vital Signs Assessment: post-procedure vital signs reviewed and stable Respiratory status: spontaneous breathing, nonlabored ventilation, respiratory function stable and patient connected to nasal cannula oxygen Cardiovascular status: stable and blood pressure returned to baseline Anesthetic complications: no    Last Vitals:  Filed Vitals:   10/08/15 1505 10/08/15 1519  BP: 97/56 96/57  Pulse: 46 51  Temp:  36.6 C  Resp: 15 16    Last Pain:  Filed Vitals:   10/08/15 1520  PainSc: 0-No pain                 Effie Berkshire

## 2015-10-08 NOTE — Clinical Documentation Improvement (Signed)
Hospitalist  Would you please further clarify diagnosis of paresthesias and affected body parts?  Other  Clinically Undetermined   Please exercise your independent, professional judgment when responding. A specific answer is not anticipated or expected.   Thank You, Judith [place] [phone]

## 2015-10-08 NOTE — Progress Notes (Signed)
Progress Note    Glenda [name]  [id number] DOB: [birth year]  DOA: 10/06/2015 PCP: Warren Danes, MD    Brief Narrative:   Glenda [name] is an 82 y.o. female with a PMH of hypertension, paresthesias, TIA, hyperlipidemia and aortic stenosis who was admitted 10/06/15 with atrial flutter.  Assessment/Plan:   Principal Problem: Atrial flutter (HCC) with exertional dyspnea  Exertional dyspnea likely from acute on chronic diastolic CHF in the setting of tachycardia. D-dimer mildly elevated, but CT negative for pulmonary embolism. Repeat echocardiogram pending. Given a dose of Lasix on admission. I/O balance -1.7 L. Weight down 3 pounds. Troponin not elevated. EKG appears to be a-flutter with rate 120s, no acute ST/ T wave abnormalities. Continue Cardizem and increased dosed metoprolol. CHA2DS2-VASc Score = 6. Ellquis started.S/P TEE/DCCV 7/19. - Appears to be new depression of systolic function, TEE showing EF 30-35%, unclear  this is related to tachycardia    Insomnia Restoril ordered as needed.    Hypercholesterolemia Continue statin.    Hypertension Hydralazine discontinued. Continue Cardizem and increased dose metoprolol.   Family Communication/Anticipated D/C date and plan/Code Status   DVT prophylaxis: on eliquis . Code Status: Full Code.  Family Communication:  None at bedside Disposition Plan: From an assisted living facility. Likely will be stable to go after cardioversion on 10/09/15.   Medical Consultants:    Cardiology   Procedures:    Anti-Infectives:   None.  Subjective:    Glenda [name] is still short of breath. No frank chest pain. No nausea/vomiting.  Objective:    Filed Vitals:   10/08/15 1445 10/08/15 1455 10/08/15 1505 10/08/15 1519  BP: 91/53 98/61 97/56  96/57  Pulse: 47 42 46 51  Temp:    97.9 F (36.6 C)  TempSrc:    Oral  Resp: 21 18 15 16   Height:      Weight:      SpO2: 95% 95% 97% 96%    Intake/Output Summary  (Last 24 hours) at 10/08/15 1703 Last data filed at 10/08/15 1430  Gross per 24 hour  Intake    276 ml  Output   1850 ml  Net  -1574 ml   Filed Weights   10/06/15 1744 10/07/15 0614 10/08/15 0628  Weight: 61.462 kg (135 lb 8 oz) 60.192 kg (132 lb 11.2 oz) 59.194 kg (130 lb 8 oz)    Exam: General exam: Appears calm and comfortable.  Respiratory system: Clear to auscultation. Respiratory effort increased. Cardiovascular system: S1 & S2 heard. No JVD,  rubs, gallops or clicks. No murmurs. Gastrointestinal system: Abdomen is nondistended, soft and nontender. No organomegaly or masses felt. Normal bowel sounds heard. Central nervous system: Alert and oriented. No focal neurological deficits. Extremities: Trace edema. Skin: No rashes, lesions or ulcers Psychiatry: Judgement and insight appear normal. Mood & affect appropriate.   Data Reviewed:   I have personally reviewed following labs and imaging studies:  Labs: Basic Metabolic Panel:  Recent Labs Lab 10/06/15 1145 10/07/15 0415  NA 134* 135  K 4.3 3.7  CL 102 99*  CO2 25 26  GLUCOSE 110* 91  BUN 20 21*  CREATININE 0.93 0.98  CALCIUM 8.7* 8.8*   GFR Estimated Creatinine Clearance: 32.8 mL/min (by C-G formula based on Cr of 0.98). Liver Function Tests:  Recent Labs Lab 10/07/15 0415  AST 44*  ALT 58*  ALKPHOS 61  BILITOT 0.7  PROT 6.1*  ALBUMIN 3.4*   CBC:  Recent Labs Lab 10/06/15 1145  10/07/15 0415  WBC 6.1 5.6  NEUTROABS 4.5  --   HGB 11.8* 11.6*  HCT 36.1 35.6*  MCV 90.3 90.4  PLT 183 182   Cardiac Enzymes:  Recent Labs Lab 10/06/15 1145  TROPONINI <0.03   D-Dimer:  Recent Labs  10/06/15 1145  DDIMER 0.86*   Microbiology Recent Results (from the past 240 hour(s))  MRSA PCR Screening     Status: None   Collection Time: 10/07/15  5:28 AM  Result Value Ref Range Status   MRSA by PCR NEGATIVE NEGATIVE Final    Comment:        The GeneXpert MRSA Assay (FDA approved for NASAL  specimens only), is one component of a comprehensive MRSA colonization surveillance program. It is not intended to diagnose MRSA infection nor to guide or monitor treatment for MRSA infections.     Radiology: No results found.  Medications:   . apixaban  2.5 mg Oral BID  . atorvastatin  20 mg Oral Daily  . diltiazem  180 mg Oral Q breakfast  . metoprolol tartrate  25 mg Oral Q6H  . pantoprazole  40 mg Oral Daily  . sodium chloride flush  3 mL Intravenous Q12H  . sodium chloride flush  3 mL Intravenous Q12H   Continuous Infusions: . sodium chloride       LOS: 1 day   Mayuri Staples MD  [facility] Pager [phone]  *Please refer to [email or web address], password [email or web address] to get updated schedule on who will round on this patient, as hospitalists switch teams weekly. If 7PM-7AM, please contact night-coverage at [email or web address], password [email or web address] for any overnight needs.  10/08/2015, 5:03 PM

## 2015-10-08 NOTE — Anesthesia Preprocedure Evaluation (Addendum)
Anesthesia Evaluation  Patient identified by MRN, date of birth, ID band Patient awake    Reviewed: Allergy & Precautions, NPO status , Patient's Chart, lab work & pertinent test results, reviewed documented beta blocker date and time   Airway Mallampati: III  TM Distance: >3 FB Neck ROM: Full    Dental  (+) Teeth Intact, Dental Advisory Given   Pulmonary neg pulmonary ROS,    breath sounds clear to auscultation       Cardiovascular hypertension, Pt. on medications and Pt. on home beta blockers +CHF  + Valvular Problems/Murmurs AS  Rhythm:Irregular Rate:Tachycardia     Neuro/Psych TIA Neuromuscular disease negative psych ROS   GI/Hepatic Neg liver ROS, hiatal hernia, GERD  Medicated,  Endo/Other  negative endocrine ROS  Renal/GU negative Renal ROS  negative genitourinary   Musculoskeletal  (+) Arthritis ,   Abdominal   Peds negative pediatric ROS (+)  Hematology negative hematology ROS (+)   Anesthesia Other Findings   Reproductive/Obstetrics negative OB ROS                            Lab Results  Component Value Date   WBC 5.6 10/07/2015   HGB 11.6* 10/07/2015   HCT 35.6* 10/07/2015   MCV 90.4 10/07/2015   PLT 182 10/07/2015   Lab Results  Component Value Date   CREATININE 0.98 10/07/2015   BUN 21* 10/07/2015   NA 135 10/07/2015   K 3.7 10/07/2015   CL 99* 10/07/2015   CO2 26 10/07/2015   Lab Results  Component Value Date   INR 1.02 06/02/2011   09/2015 Echo - Left ventricle: The cavity size was normal. There was mild concentric hypertrophy. Systolic function was mildly to moderately reduced. The estimated ejection fraction was in the range of 40% to 45%. Diffuse hypokinesis. Normal sinus rhythm was absent. The study is not technically sufficient to allow evaluation of LV diastolic function. Doppler parameters are consistent with high ventricular filling pressure. -  Aortic valve: Moderately calcified annulus. Trileaflet. Moderate diffuse thickening and calcification, consistent with sclerosis. There was trivial regurgitation. - Mitral valve: Moderate diffuse thickening of the anterior leaflet. There was mild to moderate regurgitation. - Right atrium: The atrium was mildly dilated. - Pulmonary arteries: PA peak pressure: 42 mm Hg (S). - Pericardium, extracardiac: A trivial pericardial effusion was identified posterior to the heart.    Anesthesia Physical Anesthesia Plan  ASA: III  Anesthesia Plan: MAC   Post-op Pain Management:    Induction: Intravenous  Airway Management Planned: Natural Airway  Additional Equipment:   Intra-op Plan:   Post-operative Plan:   Informed Consent: I have reviewed the patients History and Physical, chart, labs and discussed the procedure including the risks, benefits and alternatives for the proposed anesthesia with the patient or authorized representative who has indicated his/her understanding and acceptance.   Dental advisory given  Plan Discussed with: CRNA  Anesthesia Plan Comments:         Anesthesia Quick Evaluation

## 2015-10-08 NOTE — Anesthesia Procedure Notes (Signed)
Procedure Name: MAC Date/Time: 10/08/2015 2:06 PM Performed by: Salli Quarry Sigmond Patalano Pre-anesthesia Checklist: Patient identified, Emergency Drugs available, Suction available and Patient being monitored Patient Re-evaluated:Patient Re-evaluated prior to inductionOxygen Delivery Method: Nasal cannula

## 2015-10-08 NOTE — Op Note (Signed)
Procedure: Electrical Cardioversion Indications:  Atrial Tachycardia  Procedure Details:  Consent: Risks of procedure as well as the alternatives and risks of each were explained to the (patient/caregiver).  Consent for procedure obtained.  Time Out: Verified patient identification, verified procedure, site/side was marked, verified correct patient position, special equipment/implants available, medications/allergies/relevent history reviewed, required imaging and test results available.  Performed  Patient placed on cardiac monitor, pulse oximetry, supplemental oxygen as necessary.  Sedation given: IV propofol, Anesthesiology Pacer pads placed anterior and posterior chest.  Cardioverted 1 time(s).  Cardioversion with synchronized biphasic 100J shock.  Evaluation: Findings: Post procedure EKG shows: sinus bradycardia with PACs Complications: None Patient did tolerate procedure well.  Time Spent Directly with the Patient:  30 minutes   Sudais Banghart 10/08/2015, 2:27 PM

## 2015-10-08 NOTE — Care Management Important Message (Signed)
Important Message  Patient Details  Name: Glenda [name] MRN: [id number] Date of Birth: April 30, 1924   Medicare Important Message Given:  Yes    Loann Quill 10/08/2015, 10:03 AM

## 2015-10-09 ENCOUNTER — Encounter (HOSPITAL_COMMUNITY): Payer: Self-pay | Admitting: Cardiovascular Disease

## 2015-10-09 DIAGNOSIS — I5043 Acute on chronic combined systolic (congestive) and diastolic (congestive) heart failure: Secondary | ICD-10-CM

## 2015-10-09 DIAGNOSIS — R0600 Dyspnea, unspecified: Secondary | ICD-10-CM

## 2015-10-09 DIAGNOSIS — I471 Supraventricular tachycardia: Secondary | ICD-10-CM

## 2015-10-09 LAB — BASIC METABOLIC PANEL
Anion gap: 7 (ref 5–15)
BUN: 30 mg/dL — ABNORMAL HIGH (ref 6–20)
CO2: 28 mmol/L (ref 22–32)
Calcium: 8.9 mg/dL (ref 8.9–10.3)
Chloride: 101 mmol/L (ref 101–111)
Creatinine, Ser: 1.5 mg/dL — ABNORMAL HIGH (ref 0.44–1.00)
GFR calc Af Amer: 35 mL/min — ABNORMAL LOW (ref >60)
GFR calc non Af Amer: 30 mL/min — ABNORMAL LOW (ref >60)
Glucose, Bld: 99 mg/dL (ref 65–99)
Potassium: 4.1 mmol/L (ref 3.5–5.1)
Sodium: 136 mmol/L (ref 135–145)

## 2015-10-09 LAB — BRAIN NATRIURETIC PEPTIDE: B Natriuretic Peptide: 199 pg/mL — ABNORMAL HIGH (ref 0.0–100.0)

## 2015-10-09 MED ORDER — ALBUTEROL SULFATE (2.5 MG/3ML) 0.083% IN NEBU
2.5000 mg | INHALATION_SOLUTION | Freq: Four times a day (QID) | RESPIRATORY_TRACT | Status: DC | PRN
  Administered 2015-10-10 – 2015-10-11 (×2): 2.5 mg via RESPIRATORY_TRACT
  Filled 2015-10-09 (×2): qty 3

## 2015-10-09 MED ORDER — METOPROLOL TARTRATE 50 MG PO TABS: 50.0000 mg | ORAL_TABLET | Freq: Two times a day (BID) | ORAL | Status: DC

## 2015-10-09 MED ORDER — METOPROLOL TARTRATE 25 MG PO TABS
25.0000 mg | ORAL_TABLET | Freq: Two times a day (BID) | ORAL | Status: DC
  Administered 2015-10-09 – 2015-10-11 (×4): 25 mg via ORAL
  Filled 2015-10-09 (×4): qty 1

## 2015-10-09 MED ORDER — ALBUTEROL SULFATE (2.5 MG/3ML) 0.083% IN NEBU
2.5000 mg | INHALATION_SOLUTION | Freq: Four times a day (QID) | RESPIRATORY_TRACT | Status: DC
  Administered 2015-10-09: 2.5 mg via RESPIRATORY_TRACT
  Filled 2015-10-09: qty 3

## 2015-10-09 NOTE — Progress Notes (Signed)
Occupational Therapy Evaluation Patient Details Name: Glenda [name] MRN: [id number] DOB: [birth year] Today's Date: 10/09/2015    History of Present Illness 82 yo female with a-flutter was admitted, has exace CHF and TEE done 7/19, cardioversion 7/18.  EF 40-45% per reports, PMHx:  HTN, TIA, aortic stenosis, cardioversion.     Clinical Impression   PTA, pt lived in ALF but was mod I with mobility and ADL at rollator level. Completed eductiton regarding energy conservation and reducing risk of falls. Pt declined further OT services. OT signing off.     Follow Up Recommendations  No OT follow up;Supervision - Intermittent    Equipment Recommendations  None recommended by OT    Recommendations for Other Services       Precautions / Restrictions Precautions Precautions: Fall (telemetry) Restrictions Weight Bearing Restrictions: No      Mobility Bed Mobility    mod I with increased HOB              Transfers                 General transfer comment: pt declined further OOB activity    Balance                                            ADL                                         General ADL Comments: comleted educaton at bed level regarding energy conservation and reducing risk of falls at home. Pt asking multiple quesiotns about CHF.                      Pertinent Vitals/Pain Pain Assessment: No/denies pain     Hand Dominance     Extremity/Trunk Assessment Upper Extremity Assessment Upper Extremity Assessment: Generalized weakness   Lower Extremity Assessment Lower Extremity Assessment: Generalized weakness       Communication Communication Communication: HOH   Cognition Arousal/Alertness: Awake/alert Behavior During Therapy: WFL for tasks assessed/performed Overall Cognitive Status: Within Functional Limits for tasks assessed                     General Comments       Exercises  Exercises:  (4 to 4+ strength LE's)     Shoulder Instructions      Home Living Family/patient expects to be discharged to:: Assisted living                             Home Equipment: Walker - 4 wheels;Shower seat - built in;Grab bars - tub/shower;Grab bars - toilet          Prior Functioning/Environment Level of Independence: Independent with assistive device(s);Needs assistance  Gait / Transfers Assistance Needed: rollator in her home environment ADL's / Homemaking Assistance Needed: states she was independent with bathing and dressing        OT Diagnosis: Generalized weakness   OT Problem List: Decreased strength;Decreased activity tolerance;Decreased knowledge of use of DME or AE;Cardiopulmonary status limiting activity   OT Treatment/Interventions: Self-care/ADL training;Energy conservation    OT Goals(Current goals can be found in the care plan section) Acute Rehab OT Goals Patient Stated Goal: to  walk later and get some rest now OT Goal Formulation: All assessment and education complete, DC therapy  OT Frequency:     Barriers to D/C:            Co-evaluation              End of Session Nurse Communication: Mobility status  Activity Tolerance: Patient tolerated treatment well Patient left: in bed;with call bell/phone within reach;with bed alarm set   Time: 1550-1610 OT Time Calculation (min): 20 min Charges:  OT General Charges $OT Visit: 1 Procedure OT Evaluation $OT Eval Low Complexity: 1 Procedure G-Codes:    Tasean Mancha,HILLARY October 14, 2015, 5:43 PM   [facility], OTR/L  [phone] 10/14/15

## 2015-10-09 NOTE — Evaluation (Signed)
Physical Therapy Evaluation Patient Details Name: Glenda [name] MRN: [id number] DOB: Aug 23, 1924 Today's Date: 10/09/2015   History of Present Illness  82 yo female with a-flutter was admitted, has exace CHF and TEE done 7/19, cardioversion 7/18.  EF 40-45% per reports, PMHx:  HTN, TIA, aortic stenosis, cardioversion.    Clinical Impression  Pt is up to walk with PT and was down the hall with SOB but controlling gait.  Despite weakened feelings and SOB was able to maintain her O2 sats and pulses were controlled with effort.  MD in to talk with pt about symptoms and med adjustments, and will continue PT acutely to prepare for transition to her ALF when ready.    Follow Up Recommendations Home health PT;Supervision for mobility/OOB    Equipment Recommendations  Rolling walker with 5" wheels    Recommendations for Other Services Rehab consult     Precautions / Restrictions Precautions Precautions: Fall (telemetry) Restrictions Weight Bearing Restrictions: No      Mobility  Bed Mobility Overal bed mobility: Needs Assistance Bed Mobility: Supine to Sit     Supine to sit: Min assist     General bed mobility comments: minor help to scoot up to edge of bed  Transfers Overall transfer level: Needs assistance Equipment used: Rolling walker (2 wheeled);1 person hand held assist Transfers: Sit to/from [facility] Sit to Stand: Min assist Stand pivot transfers: Min guard;Min assist       General transfer comment: cues for hand placement  Ambulation/Gait Ambulation/Gait assistance: Min guard;Min assist Ambulation Distance (Feet): 150 Feet Assistive device: Rolling walker (2 wheeled);1 person hand held assist Gait Pattern/deviations: Step-through pattern;Decreased stride length;Trunk flexed;Narrow base of support Gait velocity: reduced Gait velocity interpretation: Below normal speed for age/gender General Gait Details: slow paced walk due to her SOB and  fatigue with effort (three standing rests)  Stairs            Wheelchair Mobility    Modified Rankin (Stroke Patients Only)       Balance Overall balance assessment: Needs assistance Sitting-balance support: Feet supported Sitting balance-Leahy Scale: Good       Standing balance-Leahy Scale: Fair Standing balance comment: less than fair dynamically                             Pertinent Vitals/Pain Pain Assessment: No/denies pain    Home Living Family/patient expects to be discharged to:: Unsure                      Prior Function Level of Independence: Independent with assistive device(s);Needs assistance   Gait / Transfers Assistance Needed: rollator in her home environment  ADL's / Homemaking Assistance Needed: minor assistance for self care in ALF        Hand Dominance        Extremity/Trunk Assessment   Upper Extremity Assessment: Generalized weakness           Lower Extremity Assessment: Generalized weakness      Cervical / Trunk Assessment: Kyphotic  Communication   Communication: HOH  Cognition Arousal/Alertness: Awake/alert Behavior During Therapy: WFL for tasks assessed/performed Overall Cognitive Status: Within Functional Limits for tasks assessed                      General Comments General comments (skin integrity, edema, etc.): Pt is moving with fairly good quality but low endurance that was not reflected  in her vitals pre gait or post (pulses 60's and O2 sat 97% both checks).      Exercises        Assessment/Plan    PT Assessment Patient needs continued PT services  PT Diagnosis Difficulty walking;Generalized weakness   PT Problem List Decreased strength;Decreased range of motion;Decreased activity tolerance;Decreased balance;Decreased mobility;Decreased coordination;Decreased knowledge of use of DME;Decreased safety awareness;Cardiopulmonary status limiting activity  PT Treatment  Interventions DME instruction;Gait training;Functional mobility training;Therapeutic activities;Therapeutic exercise;Balance training;Neuromuscular re-education;Patient/family education   PT Goals (Current goals can be found in the Care Plan section) Acute Rehab PT Goals Patient Stated Goal: to try to walk a little PT Goal Formulation: With patient Time For Goal Achievement: 10/23/15 Potential to Achieve Goals: Good    Frequency Min 3X/week   Barriers to discharge Other (comment) (will need to be assisted for longer walks at home initially)      Co-evaluation               End of Session Equipment Utilized During Treatment: Gait belt Activity Tolerance: Patient tolerated treatment well;Patient limited by fatigue;Patient limited by lethargy Patient left: in chair;with call bell/phone within reach;with chair alarm set Nurse Communication: Mobility status         Time: 0829-0902 PT Time Calculation (min) (ACUTE ONLY): 33 min   Charges:   PT Evaluation $PT Eval Moderate Complexity: 1 Procedure PT Treatments $Gait Training: 8-22 mins   PT G CodesRamond Dial 10-25-2015, 11:55 AM    Mee Hives, PT MS Acute Rehab Dept. Number: [place] [facility] [place]

## 2015-10-09 NOTE — Progress Notes (Signed)
Progress Note    Glenda [name]  [id number] DOB: 11/20/1924  DOA: 10/06/2015 PCP: Warren Danes, MD    Brief Narrative:   Glenda [name] is an 82 y.o. female with a PMH of hypertension, paresthesias, TIA, hyperlipidemia and aortic stenosis who was admitted 10/06/15 with atrial flutter, Status post TEE and cardioversion on 7/19  Assessment/Plan:   Atrial flutter ([place]) with exertional dyspnea  - CHA2DS2-VASc Score = 6. Ellquis started. - S/P TEE/DCCV 7/19, converted to NSR - Currently NSR, rate better controlled, off Cardizem, metoprolol dose was lowered given bradycardia. - Appears to be new depression of systolic function, TEE showing EF 30-35%, unclear  this is related to tachycardia  Acute on chronic combined systolic and diastolic CHF -  Patient with significant drop in her EF, most recent on TEE 7/19 is 30-35%, Unclear if this is related to her tachycardia. - We'll start on IV diuresis after attaining BMP. - Continue with beta blockers, will resume back on losartan if BMP is okay, also will consider Aldactone.  Dyspnea - Was likely in the setting of CHF and uncontrolled heart rate, no hypoxia,  Insomnia - Restoril ordered as needed.  Hypercholesterolemia - Continue statin.  Hypertension - Blood pressure acceptable, continue with metoprolol, will resume on losartan giving her significant low EF.    Family Communication/Anticipated D/C date and plan/Code Status   DVT prophylaxis: on eliquis . Code Status: Full Code.  Family Communication:  None at bedside Disposition Plan: From an assisted living facility.   Medical Consultants:    Cardiology   Procedures:   TEE with cardioversion 7/19  Anti-Infectives:   None.  Subjective:    Glenda [name] reporting dyspnea, but no chest pain, nausea or vomiting  Objective:    Filed Vitals:   10/08/15 2033 10/09/15 0100 10/09/15 0604 10/09/15 1217  BP: 94/58 116/53 112/59 141/57  Pulse: 54 58  42 55  Temp: 98.6 F (37 C) 98.5 F (36.9 C) 98.1 F (36.7 C) 98.1 F (36.7 C)  TempSrc: Oral Oral Oral Oral  Resp: 16 18 18 18   Height:      Weight:   59.467 kg (131 lb 1.6 oz)   SpO2: 95% 96% 99% 100%    Intake/Output Summary (Last 24 hours) at 10/09/15 1253 Last data filed at 10/09/15 0854  Gross per 24 hour  Intake    610 ml  Output    401 ml  Net    209 ml   Filed Weights   10/07/15 0614 10/08/15 0628 10/09/15 0604  Weight: 60.192 kg (132 lb 11.2 oz) 59.194 kg (130 lb 8 oz) 59.467 kg (131 lb 1.6 oz)    Exam: General exam: Appears calm and comfortable.  Respiratory system: Good air entry bilaterally, no use of accessory muscles, bibasilar crackles. Cardiovascular system: S1 & S2 heard. + JVD,  rubs, gallops or clicks. No murmurs. Gastrointestinal system: Abdomen is nondistended, soft and nontender. No organomegaly or masses felt. Normal bowel sounds heard. Central nervous system: Alert and oriented. No focal neurological deficits. Extremities: Trace edema. Skin: No rashes, lesions or ulcers Psychiatry: Judgement and insight appear normal. Mood & affect appropriate.   Data Reviewed:   I have personally reviewed following labs and imaging studies:  Labs: Basic Metabolic Panel:  Recent Labs Lab 10/06/15 1145 10/07/15 0415  NA 134* 135  K 4.3 3.7  CL 102 99*  CO2 25 26  GLUCOSE 110* 91  BUN 20 21*  CREATININE 0.93 0.98  CALCIUM 8.7* 8.8*   GFR Estimated Creatinine Clearance: 32.9 mL/min (by C-G formula based on Cr of 0.98). Liver Function Tests:  Recent Labs Lab 10/07/15 0415  AST 44*  ALT 58*  ALKPHOS 61  BILITOT 0.7  PROT 6.1*  ALBUMIN 3.4*   CBC:  Recent Labs Lab 10/06/15 1145 10/07/15 0415  WBC 6.1 5.6  NEUTROABS 4.5  --   HGB 11.8* 11.6*  HCT 36.1 35.6*  MCV 90.3 90.4  PLT 183 182   Cardiac Enzymes:  Recent Labs Lab 10/06/15 1145  TROPONINI <0.03   D-Dimer: No results for input(s): DDIMER in the last 72  hours. Microbiology Recent Results (from the past 240 hour(s))  MRSA PCR Screening     Status: None   Collection Time: 10/07/15  5:28 AM  Result Value Ref Range Status   MRSA by PCR NEGATIVE NEGATIVE Final    Comment:        The GeneXpert MRSA Assay (FDA approved for NASAL specimens only), is one component of a comprehensive MRSA colonization surveillance program. It is not intended to diagnose MRSA infection nor to guide or monitor treatment for MRSA infections.     Radiology: No results found.  Medications:   . albuterol  2.5 mg Nebulization Q6H  . apixaban  2.5 mg Oral BID  . atorvastatin  20 mg Oral Daily  . metoprolol tartrate  25 mg Oral BID  . pantoprazole  40 mg Oral Daily  . sodium chloride flush  3 mL Intravenous Q12H  . sodium chloride flush  3 mL Intravenous Q12H   Continuous Infusions: . sodium chloride       LOS: 2 days   [facility], Karryn Kosinski MD  [facility] Pager [phone]  *Please refer to [email or web address], password [email or web address] to get updated schedule on who will round on this patient, as hospitalists switch teams weekly. If 7PM-7AM, please contact night-coverage at [email or web address], password [email or web address] for any overnight needs.  10/09/2015, 12:53 PM

## 2015-10-09 NOTE — Progress Notes (Signed)
   Subjective: NO CP  Still SOB with activity Objective: Filed Vitals:   10/08/15 1519 10/08/15 2033 10/09/15 0100 10/09/15 0604  BP: 96/57 94/58 116/53 112/59  Pulse: 51 54 58 42  Temp: 97.9 F (36.6 C) 98.6 F (37 C) 98.5 F (36.9 C) 98.1 F (36.7 C)  TempSrc: Oral Oral Oral Oral  Resp: 16 16 18 18   Height:      Weight:    131 lb 1.6 oz (59.467 kg)  SpO2: 96% 95% 96% 99%   Weight change: 9.6 oz (0.272 kg)  Intake/Output Summary (Last 24 hours) at 10/09/15 0844 Last data filed at 10/08/15 2023  Gross per 24 hour  Intake    390 ml  Output    451 ml  Net    -61 ml   Net neg 3.5 L    General: Alert, awake, oriented x3, in no acute distress Neck:  JVP is normal Heart: Regular rate and rhythm, without murmurs, rubs, gallops.  Lungs: Decreased airflow with upper airway wheeze   Exemities:  No edema.   Neuro: Grossly intact, nonfocal.  Teel;  SB/SR with occasional PVCs Lab Results: No results found for this or any previous visit (from the past 24 hour(s)).  Studies/Results: No results found.  Medications: REviewed   @PROBHOSP @  1  Atrial tachycardia  Pt s/p cardioversion  Now SB, SR  I would cut back on b blocker  FOllow on telemetry  2.  Dyspnea   Still SOB  WIll give breathing Rx  May give some lasix Get labs today  Prior     LOS: 2 days   Dorris Carnes 10/09/2015, 8:44 AM

## 2015-10-10 LAB — BASIC METABOLIC PANEL
Anion gap: 7 (ref 5–15)
BUN: 31 mg/dL — ABNORMAL HIGH (ref 6–20)
CO2: 28 mmol/L (ref 22–32)
Calcium: 8.6 mg/dL — ABNORMAL LOW (ref 8.9–10.3)
Chloride: 100 mmol/L — ABNORMAL LOW (ref 101–111)
Creatinine, Ser: 1.27 mg/dL — ABNORMAL HIGH (ref 0.44–1.00)
GFR calc Af Amer: 42 mL/min — ABNORMAL LOW (ref >60)
GFR calc non Af Amer: 37 mL/min — ABNORMAL LOW (ref >60)
Glucose, Bld: 107 mg/dL — ABNORMAL HIGH (ref 65–99)
Potassium: 3.6 mmol/L (ref 3.5–5.1)
Sodium: 135 mmol/L (ref 135–145)

## 2015-10-10 LAB — TROPONIN I: Troponin I: 0.03 ng/mL (ref <0.03)

## 2015-10-10 LAB — CBC
HCT: 34.7 % — ABNORMAL LOW (ref 36.0–46.0)
Hemoglobin: 10.9 g/dL — ABNORMAL LOW (ref 12.0–15.0)
MCH: 28.9 pg (ref 26.0–34.0)
MCHC: 31.4 g/dL (ref 30.0–36.0)
MCV: 92 fL (ref 78.0–100.0)
Platelets: 166 10*3/uL (ref 150–400)
RBC: 3.77 MIL/uL — ABNORMAL LOW (ref 3.87–5.11)
RDW: 14.3 % (ref 11.5–15.5)
WBC: 7.6 10*3/uL (ref 4.0–10.5)

## 2015-10-10 LAB — MAGNESIUM: Magnesium: 2 mg/dL (ref 1.7–2.4)

## 2015-10-10 MED ORDER — BUDESONIDE 0.25 MG/2ML IN SUSP
0.2500 mg | Freq: Two times a day (BID) | RESPIRATORY_TRACT | Status: DC
  Administered 2015-10-10 – 2015-10-11 (×3): 0.25 mg via RESPIRATORY_TRACT
  Filled 2015-10-10 (×2): qty 2

## 2015-10-10 MED ORDER — FLUTICASONE PROPIONATE 50 MCG/ACT NA SUSP
2.0000 | Freq: Every day | NASAL | Status: DC
  Administered 2015-10-10: 2 via NASAL
  Filled 2015-10-10: qty 16

## 2015-10-10 MED ORDER — LOSARTAN POTASSIUM 25 MG PO TABS
25.0000 mg | ORAL_TABLET | Freq: Every day | ORAL | Status: DC
  Administered 2015-10-10 – 2015-10-11 (×2): 25 mg via ORAL
  Filled 2015-10-10 (×2): qty 1

## 2015-10-10 MED ORDER — APIXABAN 2.5 MG PO TABS: 2.5000 mg | ORAL_TABLET | Freq: Two times a day (BID) | ORAL | Status: DC

## 2015-10-10 MED ORDER — LORATADINE 10 MG PO TABS
10.0000 mg | ORAL_TABLET | Freq: Every day | ORAL | Status: DC
  Administered 2015-10-10 – 2015-10-11 (×2): 10 mg via ORAL
  Filled 2015-10-10 (×2): qty 1

## 2015-10-10 MED ORDER — POTASSIUM CHLORIDE CRYS ER 20 MEQ PO TBCR
40.0000 meq | EXTENDED_RELEASE_TABLET | Freq: Once | ORAL | Status: AC
  Administered 2015-10-10: 40 meq via ORAL
  Filled 2015-10-10: qty 2

## 2015-10-10 MED ORDER — FUROSEMIDE 10 MG/ML IJ SOLN
60.0000 mg | Freq: Once | INTRAMUSCULAR | Status: AC
  Administered 2015-10-10: 60 mg via INTRAVENOUS
  Filled 2015-10-10: qty 6

## 2015-10-10 MED ORDER — POLYETHYLENE GLYCOL 3350 17 G PO PACK
17.0000 g | PACK | Freq: Every day | ORAL | Status: DC
  Administered 2015-10-10: 17 g via ORAL
  Filled 2015-10-10: qty 1

## 2015-10-10 MED FILL — ELIQUIS 2.5 MG TABLET: 2.5 | 30 days supply | Qty: 60 | Fill #0

## 2015-10-10 NOTE — Progress Notes (Signed)
Progress Note    Glenda [name]  [id number] DOB: 03/07/1925  DOA: 10/06/2015 PCP: No PCP Per Patient    Brief Narrative:   Glenda [name] is an 82 y.o. female with a PMH of hypertension, paresthesias, TIA, hyperlipidemia and aortic stenosis who was admitted 10/06/15 with atrial flutter, Status post TEE and cardioversion on 7/19  Assessment/Plan:   Atrial flutter ([place]) with exertional dyspnea  - CHA2DS2-VASc Score = 6. Ellquis started. - S/P TEE/DCCV 7/19, converted to NSR - Currently NSR, rate better controlled, off Cardizem, metoprolol dose was lowered given bradycardia.  Acute on chronic combined systolic and diastolic CHF -  Patient with significant drop in her EF, most recent on TTE 7/19 is 40-45%, Unclear if this is related to her tachycardia. - Continue with beta blockers, Resumed on losartan 25 mg oral daily. - Diuresis per cardiology, received 60 mg of Lasix today  Dyspnea - Was likely in the setting of CHF and uncontrolled heart rate, no hypoxia, - Also doubly upper airway reactivity disease, discussed with pulmonary Dr Vaughan Browner, they will arrange for outpatient PFTs, recommendation to continue with inhaled Pulmicort, will start on ranitidine and Flonase.  Insomnia - Restoril ordered as needed.  Hypercholesterolemia - Continue statin.  Hypertension - Blood pressure acceptable, continue with metoprolol,  losartan .  NSVT - Read) beta blocker, discussed with cardiology, only a few beats    Family Communication/Anticipated D/C date and plan/Code Status   DVT prophylaxis: on eliquis . Code Status: Full Code.  Family Communication:  None at bedside Disposition Plan: From an assisted living facility, possible D/C in am.   Medical Consultants:    Cardiology   Procedures:   TEE with cardioversion 7/19  Anti-Infectives:   None.  Subjective:    Glenda [name] reporting dyspnea, but no chest pain, nausea or vomiting  Objective:     Filed Vitals:   10/10/15 0452 10/10/15 1039 10/10/15 1148 10/10/15 1150  BP: 153/71 135/54  141/77  Pulse: 65 61  55  Temp: 98.2 F (36.8 C)   98.1 F (36.7 C)  TempSrc: Oral   Oral  Resp: 18   16  Height:      Weight: 59.92 kg (132 lb 1.6 oz)     SpO2: 94%  97% 95%    Intake/Output Summary (Last 24 hours) at 10/10/15 1330 Last data filed at 10/10/15 1120  Gross per 24 hour  Intake      3 ml  Output    150 ml  Net   -147 ml   Filed Weights   10/08/15 0628 10/09/15 0604 10/10/15 0452  Weight: 59.194 kg (130 lb 8 oz) 59.467 kg (131 lb 1.6 oz) 59.92 kg (132 lb 1.6 oz)    Exam: General exam: Appears calm and comfortable.  Respiratory system: Good air entry bilaterally, no use of accessory muscles, bibasilar crackles. Cardiovascular system: S1 & S2 heard. + JVD,  rubs, gallops or clicks. No murmurs. Gastrointestinal system: Abdomen is nondistended, soft and nontender. No organomegaly or masses felt. Normal bowel sounds heard. Central nervous system: Alert and oriented. No focal neurological deficits. Extremities: Trace edema. Skin: No rashes, lesions or ulcers Psychiatry: Judgement and insight appear normal. Mood & affect appropriate.   Data Reviewed:   I have personally reviewed following labs and imaging studies:  Labs: Basic Metabolic Panel:  Recent Labs Lab 10/06/15 1145 10/07/15 0415 10/09/15 1107 10/10/15 0210 10/10/15 0627  NA 134* 135 136 135  --  K 4.3 3.7 4.1 3.6  --   CL 102 99* 101 100*  --   CO2 25 26 28 28   --   GLUCOSE 110* 91 99 107*  --   BUN 20 21* 30* 31*  --   CREATININE 0.93 0.98 1.50* 1.27*  --   CALCIUM 8.7* 8.8* 8.9 8.6*  --   MG  --   --   --   --  2.0   GFR Estimated Creatinine Clearance: 25.4 mL/min (by C-G formula based on Cr of 1.27). Liver Function Tests:  Recent Labs Lab 10/07/15 0415  AST 44*  ALT 58*  ALKPHOS 61  BILITOT 0.7  PROT 6.1*  ALBUMIN 3.4*   CBC:  Recent Labs Lab 10/06/15 1145 10/07/15 0415  10/10/15 0210  WBC 6.1 5.6 7.6  NEUTROABS 4.5  --   --   HGB 11.8* 11.6* 10.9*  HCT 36.1 35.6* 34.7*  MCV 90.3 90.4 92.0  PLT 183 182 166   Cardiac Enzymes:  Recent Labs Lab 10/06/15 1145 10/10/15 0627  TROPONINI <0.03 0.03*   D-Dimer: No results for input(s): DDIMER in the last 72 hours. Microbiology Recent Results (from the past 240 hour(s))  MRSA PCR Screening     Status: None   Collection Time: 10/07/15  5:28 AM  Result Value Ref Range Status   MRSA by PCR NEGATIVE NEGATIVE Final    Comment:        The GeneXpert MRSA Assay (FDA approved for NASAL specimens only), is one component of a comprehensive MRSA colonization surveillance program. It is not intended to diagnose MRSA infection nor to guide or monitor treatment for MRSA infections.     Radiology: No results found.  Medications:   . apixaban  2.5 mg Oral BID  . atorvastatin  20 mg Oral Daily  . budesonide (PULMICORT) nebulizer solution  0.25 mg Nebulization BID  . fluticasone  2 spray Each Nare Daily  . loratadine  10 mg Oral Daily  . losartan  25 mg Oral Daily  . metoprolol tartrate  25 mg Oral BID  . pantoprazole  40 mg Oral Daily  . polyethylene glycol  17 g Oral Daily  . sodium chloride flush  3 mL Intravenous Q12H  . sodium chloride flush  3 mL Intravenous Q12H   Continuous Infusions: . sodium chloride       LOS: 3 days   Jenesys Casseus MD  [facility] Pager [phone]  *Please refer to [email or web address], password [email or web address] to get updated schedule on who will round on this patient, as hospitalists switch teams weekly. If 7PM-7AM, please contact night-coverage at [email or web address], password [email or web address] for any overnight needs.  10/10/2015, 1:30 PM

## 2015-10-10 NOTE — Care Management Note (Signed)
Case Management Note  Patient Details  Name: Glenda [name] MRN: [id number] Date of Birth: 12-Nov-1924  Subjective/Objective:   NCM spoke with patient states that she is from [facility] , [place] spoke with rep there and they set up the hhp/hhot but they will need orders, NCM notified MD to put orders in for hhpt/hhot so they can be sent back with patient at dc.  Patient will also be on eliquis, will give a 30 day savings card to patient.  Dottie at [facility] hhpt/hhot needs to be on the [place], [place] notified.  Patient states she has transportation back to the facility at discharge.                   Action/Plan:   Expected Discharge Date:                  Expected Discharge Plan:  Assisted Living / Rest Home  In-House Referral:  NA  Discharge planning Services  CM Consult  Post Acute Care Choice:  NA Choice offered to:  NA  DME Arranged:  N/A (RW, Rollator, transport chair, cane) DME Agency:  NA  HH Arranged:  NA HH Agency:  NA  Status of Service:  Completed, signed off  If discussed at [place] of Stay Meetings, dates discussed:    Additional Comments:  Zenon Mayo, RN 10/10/2015, 11:05 AM

## 2015-10-10 NOTE — Progress Notes (Signed)
CRITICAL VALUE ALERT  Critical value received: troponin 0.03  Date of notification:  04/12/15  Time of notification:  0730  Critical value read back:yes  Nurse who received alert:  Roosvelt Harps, RN  MD notified (1st page):  [facility] [place]  Time of first page:  0730  MD notified (2nd page):  Time of second page:  Responding MD:    Time MD responded:

## 2015-10-10 NOTE — Progress Notes (Addendum)
Subjective: No CP  Breating is better but still not normal for her  Coughed a lot yesterday night Objective: Filed Vitals:   10/09/15 1217 10/09/15 2215 10/09/15 2313 10/10/15 0452  BP: 141/57  138/60 153/71  Pulse: 55 65 44 65  Temp: 98.1 F (36.7 C)  98.1 F (36.7 C) 98.2 F (36.8 C)  TempSrc: Oral  Oral Oral  Resp: 18  18 18   Height:      Weight:    132 lb 1.6 oz (59.92 kg)  SpO2: 100%  96% 94%   Weight change: 1 lb (0.454 kg)  Intake/Output Summary (Last 24 hours) at 10/10/15 0902 Last data filed at 10/10/15 0827  Gross per 24 hour  Intake      0 ml  Output    150 ml  Net   -150 ml   Net I/O  3.6 L  General: Alert, awake, oriented x3, in no acute distress Neck:  JVP is normal Heart: Regular rate and rhythm, without murmurs, rubs, gallops.  Lungs: Clear to auscultation.  Upper airway wheeze Exemities:  Tr edema.   Neuro: Grossly intact, nonfocal.  Tele  SR   Lab Results: Results for orders placed or performed during the hospital encounter of 10/06/15 (from the past 24 hour(s))  Brain natriuretic peptide     Status: Abnormal   Collection Time: 10/09/15 11:07 AM  Result Value Ref Range   B Natriuretic Peptide 199.0 (H) 0.0 - 100.0 pg/mL  Basic metabolic panel     Status: Abnormal   Collection Time: 10/09/15 11:07 AM  Result Value Ref Range   Sodium 136 135 - 145 mmol/L   Potassium 4.1 3.5 - 5.1 mmol/L   Chloride 101 101 - 111 mmol/L   CO2 28 22 - 32 mmol/L   Glucose, Bld 99 65 - 99 mg/dL   BUN 30 (H) 6 - 20 mg/dL   Creatinine, Ser 1.50 (H) 0.44 - 1.00 mg/dL   Calcium 8.9 8.9 - 10.3 mg/dL   GFR calc non Af Amer 30 (L) >60 mL/min   GFR calc Af Amer 35 (L) >60 mL/min   Anion gap 7 5 - 15  Basic metabolic panel     Status: Abnormal   Collection Time: 10/10/15  2:10 AM  Result Value Ref Range   Sodium 135 135 - 145 mmol/L   Potassium 3.6 3.5 - 5.1 mmol/L   Chloride 100 (L) 101 - 111 mmol/L   CO2 28 22 - 32 mmol/L   Glucose, Bld 107 (H) 65 - 99 mg/dL   BUN 31 (H) 6 - 20 mg/dL   Creatinine, Ser 1.27 (H) 0.44 - 1.00 mg/dL   Calcium 8.6 (L) 8.9 - 10.3 mg/dL   GFR calc non Af Amer 37 (L) >60 mL/min   GFR calc Af Amer 42 (L) >60 mL/min   Anion gap 7 5 - 15  CBC     Status: Abnormal   Collection Time: 10/10/15  2:10 AM  Result Value Ref Range   WBC 7.6 4.0 - 10.5 K/uL   RBC 3.77 (L) 3.87 - 5.11 MIL/uL   Hemoglobin 10.9 (L) 12.0 - 15.0 g/dL   HCT 34.7 (L) 36.0 - 46.0 %   MCV 92.0 78.0 - 100.0 fL   MCH 28.9 26.0 - 34.0 pg   MCHC 31.4 30.0 - 36.0 g/dL   RDW 14.3 11.5 - 15.5 %   Platelets 166 150 - 400 K/uL  Magnesium     Status: None  Collection Time: 10/10/15  6:27 AM  Result Value Ref Range   Magnesium 2.0 1.7 - 2.4 mg/dL  Troponin I     Status: Abnormal   Collection Time: 10/10/15  6:27 AM  Result Value Ref Range   Troponin I 0.03 (HH) <0.03 ng/mL    Studies/Results: No results found.  Medications: Reviewed   @PROBHOSP @  1 Rhythm  Pt remains in SR  Keep on current Regimen including anticoag   2.  SOB  Volume is improved from admitt Still may have somce increase  She also has some uppper airway reactivity  On acid inhibitor  Will continue albuterol  WIll add steroid.  Discuss with IM pulm eval  3  LV dysfunction  Echo showed  LVEF 40 to 45%  May be tachy induced   Will add back low dose losartan 25  Continue b blokcer    LOS: 3 days   Dorris Carnes 10/10/2015, 9:02 AM

## 2015-10-10 NOTE — Care Management Important Message (Signed)
Important Message  Patient Details  Name: Glenda [name] MRN: [id number] Date of Birth: [birth year]   Medicare Important Message Given:  Yes    Loann Quill 10/10/2015, 8:18 AM

## 2015-10-10 NOTE — Progress Notes (Signed)
Pt had 20 beats run of VTach, denies chest pain,had SOB an hour ago, breathing treatment was given and felt relief after. Dr. Tamala Julian was notified and ordered stat Troponin and Magnesium and stat 12L EKG. Will monitor pt

## 2015-10-10 NOTE — Clinical Social Work Note (Addendum)
Updated FL2 with PT/OT faxed to [facility]. MD signed.  Plan is for discharge tomorrow.  Dayton Scrape, CSW [phone]  3:13 pm ALF says they cannot take patient tomorrow if she has had any medication changes. Discussed with RNCM who said she only has one new medication and could have it filled here at the hospital and sent with her back to facility. CSW called a left voicemail with admissions to see if this is okay.  Dayton Scrape, [place]

## 2015-10-11 LAB — BASIC METABOLIC PANEL
Anion gap: 7 (ref 5–15)
BUN: 25 mg/dL — ABNORMAL HIGH (ref 6–20)
CO2: 29 mmol/L (ref 22–32)
Calcium: 8.6 mg/dL — ABNORMAL LOW (ref 8.9–10.3)
Chloride: 101 mmol/L (ref 101–111)
Creatinine, Ser: 0.89 mg/dL (ref 0.44–1.00)
GFR calc Af Amer: >60 mL/min (ref >60)
GFR calc non Af Amer: 56 mL/min — ABNORMAL LOW (ref >60)
Glucose, Bld: 102 mg/dL — ABNORMAL HIGH (ref 65–99)
Potassium: 3.9 mmol/L (ref 3.5–5.1)
Sodium: 137 mmol/L (ref 135–145)

## 2015-10-11 MED ORDER — HYDROCODONE-ACETAMINOPHEN 5-325 MG PO TABS: 1.0000 | ORAL_TABLET | Freq: Two times a day (BID) | ORAL | Status: DC

## 2015-10-11 MED ORDER — FLUTICASONE PROPIONATE 50 MCG/ACT NA SUSP: 2.0000 | Freq: Every day | NASAL | Status: DC

## 2015-10-11 MED ORDER — BUDESONIDE 0.25 MG/2ML IN SUSP: 0.2500 mg | Freq: Two times a day (BID) | RESPIRATORY_TRACT | Status: DC

## 2015-10-11 MED ORDER — METOPROLOL TARTRATE 25 MG PO TABS: 25.0000 mg | ORAL_TABLET | Freq: Two times a day (BID) | ORAL | Status: DC

## 2015-10-11 MED ORDER — LORATADINE 10 MG PO TABS
10.0000 mg | ORAL_TABLET | Freq: Every day | ORAL | Status: DC
Start: 2015-10-11 — End: 2015-11-10

## 2015-10-11 MED ORDER — LOSARTAN POTASSIUM 25 MG PO TABS: 25.0000 mg | ORAL_TABLET | Freq: Every day | ORAL | Status: DC

## 2015-10-11 MED ORDER — FUROSEMIDE 40 MG PO TABS
40.0000 mg | ORAL_TABLET | Freq: Every day | ORAL | Status: DC
  Administered 2015-10-11: 40 mg via ORAL
  Filled 2015-10-11: qty 1

## 2015-10-11 MED ORDER — OMEPRAZOLE 20 MG PO CPDR: 20.0000 mg | DELAYED_RELEASE_CAPSULE | Freq: Every day | ORAL | Status: DC

## 2015-10-11 MED ORDER — FUROSEMIDE 20 MG PO TABS: 40.0000 mg | ORAL_TABLET | Freq: Every day | ORAL | Status: DC

## 2015-10-11 MED ORDER — LOSARTAN POTASSIUM 25 MG PO TABS
25.0000 mg | ORAL_TABLET | Freq: Every day | ORAL | Status: DC
Start: 2015-10-11 — End: 2015-10-11

## 2015-10-11 NOTE — NC FL2 (Addendum)
[place] LEVEL OF CARE SCREENING TOOL     IDENTIFICATION  Patient Name: Glenda [name] Birthdate: Sep 22, 1924 Sex: female Admission Date (Current Location): 10/06/2015  [facility] and [place] Number:  Herbalist and Address:  [facility] [place]. [facility], [place] [address] [place], [place] [address]      Provider Number: [id number]  Attending Physician Name and Address:  Albertine Patricia, MD  Relative Name and Phone Number:       Current Level of Care: Hospital Recommended Level of Care: [place] Prior Approval Number:    Date Approved/Denied:   PASRR Number:    Discharge Plan: Other (Comment) (ALF)    Current Diagnoses: Patient Active Problem List   Diagnosis Date Noted  . Acute on chronic systolic CHF (congestive heart failure) ([place])   . Diastolic CHF, acute on chronic (HCC) 10/07/2015  . Exertional dyspnea 10/06/2015  . Paroxysmal ventricular tachycardia ([place])   . Atrial flutter ([place])   . Malaise and fatigue 12/20/2014  . Shortness of breath 12/13/2014  . Palpitations 12/13/2014  . Vertigo 11/19/2012  . Unspecified constipation 11/06/2012  . Accelerated/malignant hypertension 09/14/2012  . Peripheral edema 07/11/2012  . Aortic stenosis, mild 07/11/2012  . Chronic diastolic HF (heart failure) ([place]) 07/11/2012  . Osteoarthritis 04/03/2012  . Heart murmur, aortic 02/29/2012  . Anemia 06/10/2011  . Hypertension 03/25/2011  . PVC (premature ventricular contraction) 02/16/2011  . Dizziness 12/01/2010  . Insomnia 09/15/2010  . Hypercholesterolemia 09/15/2010    Orientation RESPIRATION BLADDER Height & Weight     Self, Time, Situation, Place  Normal Continent Weight: 58.559 kg (129 lb 1.6 oz) Height:  5\' 1"  (154.9 cm)  BEHAVIORAL SYMPTOMS/MOOD NEUROLOGICAL BOWEL NUTRITION STATUS   (None)  (None) Continent Diet (Heart healthy)  AMBULATORY STATUS COMMUNICATION OF NEEDS Skin   Limited Assist Verbally Normal                        Personal Care Assistance Level of Assistance  Bathing, Feeding, Dressing Bathing Assistance: Independent Feeding assistance: Independent Dressing Assistance: Independent     Functional Limitations Info  Sight, Hearing, Speech Sight Info: Adequate Hearing Info: Adequate Speech Info: Adequate    SPECIAL CARE FACTORS FREQUENCY  Blood pressure                    Contractures Contractures Info: Not present    Additional Factors Info  Code Status, Allergies Code Status Info: DNR Allergies Info: Ace Inhibitors, Amitriptyline,            Current Medications (10/11/2015):  This is the current hospital active medication list Current Facility-Administered Medications  Medication Dose Route Frequency Provider Last Rate Last Dose  . 0.9 %  sodium chloride infusion  250 mL Intravenous Continuous Fay Records, MD      . acetaminophen (TYLENOL) tablet 650 mg  650 mg Oral Q6H PRN Rondel Jumbo, PA-C   650 mg at 10/08/15 2218   Or  . acetaminophen (TYLENOL) suppository 650 mg  650 mg Rectal Q6H PRN Rondel Jumbo, PA-C      . albuterol (PROVENTIL) (2.5 MG/3ML) 0.083% nebulizer solution 2.5 mg  2.5 mg Nebulization Q6H PRN Albertine Patricia, MD   2.5 mg at 10/11/15 1045  . apixaban (ELIQUIS) tablet 2.5 mg  2.5 mg Oral BID Venetia Maxon Rama, MD   2.5 mg at 10/11/15 1022  . atorvastatin (LIPITOR) tablet 20 mg  20  mg Oral Daily Waldemar Dickens, MD   20 mg at 10/11/15 1022  . bisacodyl (DULCOLAX) suppository 10 mg  10 mg Rectal Daily PRN Rondel Jumbo, PA-C   10 mg at 10/10/15 1926  . budesonide (PULMICORT) nebulizer solution 0.25 mg  0.25 mg Nebulization BID Fay Records, MD   0.25 mg at 10/11/15 [id number]  . fluticasone (FLONASE) 50 MCG/ACT nasal spray 2 spray  2 spray Each Nare Daily Albertine Patricia, MD   2 spray at 10/10/15 1457  . HYDROcodone-acetaminophen (NORCO/VICODIN) 5-325 MG per tablet 1-2 tablet  1-2 tablet Oral Q4H PRN Rondel Jumbo, PA-C   1 tablet at  10/07/15 1331  . loratadine (CLARITIN) tablet 10 mg  10 mg Oral Daily Albertine Patricia, MD   10 mg at 10/11/15 1022  . losartan (COZAAR) tablet 25 mg  25 mg Oral Daily Fay Records, MD   25 mg at 10/11/15 1022  . magnesium citrate solution 1 Bottle  1 Bottle Oral Once PRN Rondel Jumbo, PA-C      . metoprolol tartrate (LOPRESSOR) tablet 25 mg  25 mg Oral BID Fay Records, MD   25 mg at 10/11/15 1022  . morphine 2 MG/ML injection 1 mg  1 mg Intravenous Q4H PRN Rondel Jumbo, PA-C      . ondansetron [facility]) injection 4 mg  4 mg Intravenous Q6H PRN Albertine Patricia, MD   4 mg at 10/08/15 0812  . ondansetron (ZOFRAN) tablet 4 mg  4 mg Oral Q8H PRN Rondel Jumbo, PA-C   4 mg at 10/08/15 0641  . pantoprazole (PROTONIX) EC tablet 40 mg  40 mg Oral Daily Rondel Jumbo, PA-C   40 mg at 10/11/15 1022  . polyethylene glycol (MIRALAX / GLYCOLAX) packet 17 g  17 g Oral Daily Fay Records, MD   17 g at 10/10/15 1118  . senna-docusate (Senokot-S) tablet 1 tablet  1 tablet Oral QHS PRN Rondel Jumbo, PA-C      . sodium chloride flush (NS) 0.9 % injection 3 mL  3 mL Intravenous Q12H Rondel Jumbo, PA-C   3 mL at 10/09/15 2345  . sodium chloride flush (NS) 0.9 % injection 3 mL  3 mL Intravenous Q12H Fay Records, MD   3 mL at 10/11/15 1028  . sodium chloride flush (NS) 0.9 % injection 3 mL  3 mL Intravenous PRN Fay Records, MD      . temazepam (RESTORIL) capsule 15 mg  15 mg Oral QHS PRN Jeryl Columbia, NP   15 mg at 10/10/15 2234     Discharge Medications: Please see discharge summary for a list of discharge medications.  Relevant Imaging Results:  Relevant Lab Results:   Additional Information SS#: [id number]  Rigoberto Noel, LCSW     Attested by Phillips Climes MD

## 2015-10-11 NOTE — Discharge Summary (Addendum)
Glenda [name], is a 82 y.o. female  DOB 18-Nov-1924  MRN [id number].  Admission date:  10/06/2015  Admitting Physician  Waldemar Dickens, MD  Discharge Date:  10/11/2015   Primary MD  No PCP Per Patient  Recommendations for primary care physician for things to follow:  - Please check CBC, BMP in 1 week. - Patient to follow with cardiology ion 1-2 weeks. - Patient to follow with pulmonary on scheduled appointment 7/26 for evaluation for reactive airway disease.  Admission Diagnosis  Sinus tachycardia (HCC) [R00.0] Exertional dyspnea [R06.09] Congestive heart failure, unspecified congestive heart failure chronicity, unspecified congestive heart failure type ([place]) [I50.9]   Discharge Diagnosis  Sinus tachycardia (HCC) [R00.0] Exertional dyspnea [R06.09] Congestive heart failure, unspecified congestive heart failure chronicity, unspecified congestive heart failure type (HCC) [I50.9]    Principal Problem:   Atrial flutter (HCC) Active Problems:   Insomnia   Hypercholesterolemia   PVC (premature ventricular contraction)   Hypertension   Anemia   Shortness of breath   Exertional dyspnea   Paroxysmal ventricular tachycardia (HCC)   Diastolic CHF, acute on chronic (HCC)   Acute on chronic systolic CHF (congestive heart failure) (HCC)      Past Medical History  Diagnosis Date  . Hypertension   . Peripheral edema   . PVCs (premature ventricular contractions)   . TIA (transient ischemic attack)     pt denies this hx on 10/09/2015 "it was the [place] I was taking; they had thought I was having a stroke"  . Diverticulosis   . Hypercholesterolemia   . Insomnia   . Aortic stenosis     Echo 02/2011 showing mild AS with normal LV systolic function  . Sacral fracture ([place]) 11/06/2012  . Complication of anesthesia     "they had trouble waking me up after colon resection"  . Atrial flutter ([place])   . CHF  (congestive heart failure) ([place])   . Chronic bronchitis ([place])     "off and on; several years" (10/09/2015)  . Hiatal hernia     hx  . GERD (gastroesophageal reflux disease)   . OA (osteoarthritis) of knee   . DDD (degenerative disc disease), lumbar     severe facet dz and adv DDD MRI L spine 2009  . Arthritis     "lower back" (10/09/2015)  . Chronic lower back pain     Past Surgical History  Procedure Laterality Date  . Colonoscopy    . Cesarean section  1954  . Excisional hemorrhoidectomy    . Breast biopsy Left   . Kyphoplasty Right 09/11/2012    Procedure: Right Acrylic Sacroplasty;  Surgeon: Kristeen Miss, MD;  Location: [facility];  Service: Neurosurgery;  Laterality: Right;  Right  Acrylic Sacroplasty  . Tee without cardioversion N/A 10/08/2015    Procedure: TRANSESOPHAGEAL ECHOCARDIOGRAM (TEE);  Surgeon: Sanda Klein, MD;  Location: [facility];  Service: Cardiovascular;  Laterality: N/A;  . Cardioversion N/A 10/08/2015    Procedure: CARDIOVERSION;  Surgeon: Sanda Klein, MD;  Location: [facility]  ENDOSCOPY;  Service: Cardiovascular;  Laterality: N/A;  . Cardiac catheterization  02/17/2001    MILD REGURGITATION. EF 60%  . Inguinal hernia repair Bilateral   . Umbilical hernia repair    . Total knee arthroplasty Right   . Cataract extraction w/ intraocular lens  implant, bilateral Bilateral   . Colectomy         History of present illness and  Hospital Course:     Kindly see H&P for history of present illness and admission details, please review complete Labs, Consult reports and Test reports for all details in brief  HPI  from the history and physical done on the day of admission 10/06/2015  HPI: Glenda [name] is a 82 y.o. female with medical history significant for HTN, history of TIA, loss arthritis, hypercholesterolemia, degenerative disc disease,aortic stenosis,history of PVCs presenting with progressive exertional shortness of breath worse for the last 2 days ,  unable to walk 10 feet at the time of presentation. No dizziness, or presyncopal episodes prior to admission. Denies any chest pain. He does report nausea. The patient reports mild dry cough, without hemoptysis. She denies any wheezing. No orthopnea or paroxysmal nocturnal dyspnea. No history of PE DVT or chest wall pain. The patient denies any hormone supplements, long-distance trips, or recent surgery. She denies any history of cancer. She denies any immobilization over the last few weeks.the patient is compliant with her medication. She dus finish a course of antibiotics for the treatment of UTI   ED Course: BP 127/98 mmHg  Pulse 123  Temp(Src) 97.5 F (36.4 C) (Oral)  Resp 22  Wt 58.06 kg (128 lb)  SpO2 98%  sodium 134, calcium 8.7, hemoglobin 11.8. D-dimer 0.86. Troponin negative. Urinalysis is pending. Chest x-ray shows cardiac enlargement, with chronic lung changes, but without acute overlying pulmonary process. CT of the chest on 10/06/2015, with or without contrast,is negative for PE, but is cardiomegaly with predominantly right atrial enlargement, suggestive of a right heart failure, small right and trace left pleural effusion, a 4 mm right lower lobe groundglass attenuation pulmonary nodule EKG shows ectopic atrial tachycardia, unifocal low voltage extremity leads and ST depression, minimal ST elevation, prolonged QT interval new changes,QTC 508. Cardiac urology evaluation is ongoing. Hemoglobin 11.8 otherwise unremarkable Sodium 134, otherwise CMP is unremarkable.   Hospital Course  Glenda [name] is an 82 y.o. female with a PMH of hypertension, paresthesias, TIA, hyperlipidemia and aortic stenosis who was admitted 10/06/15 with atrial flutter, Status post TEE and cardioversion on 7/19  Atrial flutter ([place]) with exertional dyspnea  - CHA2DS2-VASc Score = 6. Ellquis started. - S/P TEE/DCCV 7/19, converted to NSR - Currently NSR, rate better controlled, off Cardizem,  metoprolol dose was lowered given bradycardia. Be discharged on 25 mg by mouth twice a day.  Acute on chronic combined systolic and diastolic CHF - Patient with significant drop in her EF, most recent on TTE 7/19 is 40-45%, Unclear if this is related to her tachycardia. - Continue with metoprolol, resumed on losartan, dose was lowered 100 mg to 25 mg oral daily - Evidence of volume overload, received 60 mg of IV Lasix yesterday, her renal function remains stable, will be discharged on 40 mg oral Lasix daily.  Dyspnea - Was likely in the setting of CHF and uncontrolled heart rate, no hypoxia, - Also doubly upper airway reactivity disease, discussed with pulmonary Dr Vaughan Browner, they will arrange for outpatient PFTs, recommendation to continue with inhaled Pulmicort, and  ranitidine and Flonase.  Point scheduled with Dr. Shyrl Numbers on 7/26  Insomnia - Restoril ordered as needed.  Hypercholesterolemia - Continue statin.  Hypertension - Blood pressure acceptable, continue with metoprolol, losartan .  NSVT - Read) beta blocker, discussed with cardiology, only a few beats   Discharge Condition:   Stable   Follow UP  Follow-up Information    Follow up with Christinia Gully, MD On 10/15/2015.   Specialty:  Pulmonary Disease   Why:  Appt at 11:00 AM    Contact information:   [address] [place] [address] [phone]       Follow up with Kirk Ruths, MD. Schedule an appointment as soon as possible for a visit in 1 week.   Specialty:  Cardiology   Contact information:   [address] [place] [address] [phone]         Discharge Instructions  and  Discharge Medications         Discharge Instructions    Discharge instructions    Complete by:  As directed   Follow with Primary MD  in 7 days   Get CBC, CMP,  checked  by Primary MD next visit.    Activity: As tolerated with Full fall precautions use walker/cane & assistance as needed   Disposition  ALF   Diet: Heart Healthy ,low salt with fluid restriction 1500 mL , with feeding assistance and aspiration precautions.  For Heart failure patients - Check your Weight same time everyday, if you gain over 2 pounds, or you develop in leg swelling, experience more shortness of breath or chest pain, call your Primary MD immediately. Follow Cardiac Low Salt Diet and 1.5 lit/day fluid restriction.   On your next visit with your primary care physician please Get Medicines reviewed and adjusted.   Please request your Prim.MD to go over all Hospital Tests and Procedure/Radiological results at the follow up, please get all Hospital records sent to your Prim MD by signing hospital release before you go home.   If you experience worsening of your admission symptoms, develop shortness of breath, life threatening emergency, suicidal or homicidal thoughts you must seek medical attention immediately by calling 911 or calling your MD immediately  if symptoms less severe.  You Must read complete instructions/literature along with all the possible adverse reactions/side effects for all the Medicines you take and that have been prescribed to you. Take any new Medicines after you have completely understood and accpet all the possible adverse reactions/side effects.   Do not drive, operating heavy machinery, perform activities at heights, swimming or participation in water activities or provide baby sitting services if your were admitted for syncope or siezures until you have seen by Primary MD or a Neurologist and advised to do so again.  Do not drive when taking Pain medications.    Do not take more than prescribed Pain, Sleep and Anxiety Medications  Special Instructions: If you have smoked or chewed Tobacco  in the last 2 yrs please stop smoking, stop any regular Alcohol  and or any Recreational drug use.  Wear Seat belts while driving.   Please note  You were cared for by a hospitalist during your  hospital stay. If you have any questions about your discharge medications or the care you received while you were in the hospital after you are discharged, you can call the unit and asked to speak with the hospitalist on call if the hospitalist that took care of you is not available. Once  you are discharged, your primary care physician will handle any further medical issues. Please note that NO REFILLS for any discharge medications will be authorized once you are discharged, as it is imperative that you return to your primary care physician (or establish a relationship with a primary care physician if you do not have one) for your aftercare needs so that they can reassess your need for medications and monitor your lab values.     Increase activity slowly    Complete by:  As directed             Medication List    STOP taking these medications        CARDIZEM CD 120 MG 24 hr capsule  Generic drug:  diltiazem     hydrALAZINE 25 MG tablet  Commonly known as:  APRESOLINE      TAKE these medications        apixaban 2.5 MG Tabs tablet  Commonly known as:  ELIQUIS  Take 1 tablet (2.5 mg total) by mouth 2 (two) times daily.     atorvastatin 20 MG tablet  Commonly known as:  LIPITOR  Take 1 tablet (20 mg total) by mouth daily.     budesonide 0.25 MG/2ML nebulizer solution  Commonly known as:  PULMICORT  Take 2 mLs (0.25 mg total) by nebulization 2 (two) times daily. Please dispence 4 ampules     CEROVITE PO  Take 1 tablet by mouth daily.     Cranberry 425 MG Caps  Take 425 mg by mouth 2 (two) times daily.     docusate sodium 100 MG capsule  Commonly known as:  COLACE  Take 100 mg by mouth 2 (two) times daily.     fluticasone 50 MCG/ACT nasal spray  Commonly known as:  FLONASE  Place 2 sprays into both nostrils daily.     furosemide 20 MG tablet  Commonly known as:  LASIX  Take 2 tablets (40 mg total) by mouth daily.     HYDROcodone-acetaminophen 5-325 MG tablet  Commonly  known as:  NORCO/VICODIN  Take 1 tablet by mouth 2 (two) times daily. TAKE 1 TAB AS NEEDED FOR PAIN every 12 hours     loratadine 10 MG tablet  Commonly known as:  CLARITIN  Take 1 tablet (10 mg total) by mouth daily.     losartan 25 MG tablet  Commonly known as:  COZAAR  Take 1 tablet (25 mg total) by mouth daily. Please stop taking losartan 100 mg oral daily, and start taking 25 mg oral daily instead     Melatonin 10 MG Caps  Take 10 mg by mouth at bedtime.     metoprolol tartrate 25 MG tablet  Commonly known as:  LOPRESSOR  Take 1 tablet (25 mg total) by mouth 2 (two) times daily. Please stop taking 50 mg oral twice daily and start taking 25 mg oral twice daily     omeprazole 20 MG capsule  Commonly known as:  PRILOSEC  Take 1 capsule (20 mg total) by mouth daily. May substitute with Prilosec DR if available     ondansetron 4 MG tablet  Commonly known as:  ZOFRAN  Take 4 mg by mouth every 8 (eight) hours as needed for nausea or vomiting.     OVER THE COUNTER MEDICATION  Take 10 mLs by mouth daily as needed (heartburn). Antacid original liquid     OVER THE COUNTER MEDICATION  Take 20 mLs by mouth 3 (three) times daily  as needed (cough). QC Tussin CF PE     polyethylene glycol packet  Commonly known as:  MIRALAX / GLYCOLAX  Take 17 g by mouth daily as needed (constipation). Mix in 8 oz liquid and drink     PROAIR HFA 108 (90 Base) MCG/ACT inhaler  Generic drug:  albuterol  Inhale 2 puffs into the lungs every 6 (six) hours as needed for shortness of breath.     temazepam 15 MG capsule  Commonly known as:  RESTORIL  Take 1 capsule (15 mg total) by mouth at bedtime as needed. Insomnia     Vitamin D 1000 units capsule  Take 1,000 Units by mouth daily.          Diet and Activity recommendation: See Discharge Instructions above   Consults obtained -  Cardiology   Major procedures and Radiology Reports - PLEASE review detailed and final reports for all details, in  brief -   TEE with cardioversion 7/19   Dg Chest 2 View  10/06/2015  CLINICAL DATA:  One week history of dyspnea. EXAM: CHEST  2 VIEW COMPARISON:  03/01/2012 FINDINGS: The heart is enlarged. There is tortuosity, ectasia and calcification of the thoracic aorta. The pulmonary hila appear normal. There are emphysematous and chronic bronchitic type lung changes but no definite acute overlying pulmonary process. No definite edema, infiltrates or effusions. The bony thorax is intact. IMPRESSION: Cardiac enlargement and chronic lung changes but no definite acute overlying pulmonary process. Electronically Signed   By: Marijo Sanes M.D.   On: 10/06/2015 12:38   Ct Angio Chest Pe W/cm &/or Wo Cm  10/06/2015  CLINICAL DATA:  82 year old female with progressive shortness of breath on exertion EXAM: CT ANGIOGRAPHY CHEST WITH CONTRAST TECHNIQUE: Multidetector CT imaging of the chest was performed using the standard protocol during bolus administration of intravenous contrast. Multiplanar CT image reconstructions and MIPs were obtained to evaluate the vascular anatomy. CONTRAST:  80 mL Isovue 370 COMPARISON:  Prior chest x-ray 10/06/2015 FINDINGS: Mediastinum: Unremarkable CT appearance of the thyroid gland. No suspicious mediastinal or hilar adenopathy. No soft tissue mediastinal mass. The thoracic esophagus is unremarkable. Heart/Vascular: Adequate opacification of pulmonary arteries to the proximal subsegmental level. No evidence filling defect to suggest acute pulmonary embolus. The heart is enlarged, particularly the right atrium. No significant pericardial effusion. Atherosclerotic calcifications present in the left anterior descending and circumflex coronary arteries. Normal caliber thoracic aorta. Aortic atherosclerotic calcifications are present. Lungs/Pleura: Small right and trace left pleural effusions. Mild interlobular septal thickening. Diffuse mild bronchial wall thickening. 4 mm nodular opacity in the  periphery of the right lower lobe (56 of series 407). There are a few other scattered patchy foci of ground-glass attenuation opacity bilaterally. Bones/Soft Tissues: No acute fracture or aggressive appearing lytic or blastic osseous lesion. Upper Abdomen: Reflux of contrast material into the dilated suprahepatic IVC and hepatic veins. Otherwise, the visualized upper abdomen is unremarkable. Review of the MIP images confirms the above findings. IMPRESSION: 1. Negative for acute pulmonary embolus. 2. Cardiomegaly with predominantly right atrial enlargement and reflux of contrast material into the dilated suprahepatic IVC and hepatic veins suggests right heart failure. 3. Small right and trace left pleural effusions. 4. Mild interlobular septal thickening and scattered foci of patchy ground-glass suggest early interstitial pulmonary edema. 5. 4 mm right lower lobe ground-glass attenuation pulmonary nodule. No follow-up recommended. This recommendation follows the consensus statement: Guidelines for Management of Incidental Pulmonary Nodules Detected on CT Images:From the Fleischner Society 2017; published  online before print (10.1148/radiol.SG:5268862). 6. Coronary artery calcifications. 7.  Aortic Atherosclerosis (ICD10-170.0) 8. Diffuse mild bronchial wall thickening. Electronically Signed   By: Jacqulynn Cadet M.D.   On: 10/06/2015 15:11    Micro Results    Recent Results (from the past 240 hour(s))  MRSA PCR Screening     Status: None   Collection Time: 10/07/15  5:28 AM  Result Value Ref Range Status   MRSA by PCR NEGATIVE NEGATIVE Final    Comment:        The GeneXpert MRSA Assay (FDA approved for NASAL specimens only), is one component of a comprehensive MRSA colonization surveillance program. It is not intended to diagnose MRSA infection nor to guide or monitor treatment for MRSA infections.        Today   Subjective:   Jeselle Kosakowski today has no headache,no chest or abdominal  pain, feels much better wants to go home today.   Objective:   Blood pressure 149/62, pulse 76, temperature 98.2 F (36.8 C), temperature source Oral, resp. rate 20, height 5\' 1"  (1.549 m), weight 58.559 kg (129 lb 1.6 oz), SpO2 93 %.   Intake/Output Summary (Last 24 hours) at 10/11/15 1642 Last data filed at 10/11/15 1323  Gross per 24 hour  Intake    843 ml  Output    452 ml  Net    391 ml    Exam Awake Alert, Oriented x 3, No new F.N deficits, Normal affect [place].AT,PERRAL Supple Neck,No JVD,   Symmetrical Chest wall movement, Good air movement bilaterally, CTAB RRR,No Gallops,Rubs or new Murmurs, No Parasternal Heave +ve B.Sounds, Abd Soft, Non tender, No organomegaly appriciated, No rebound -guarding or rigidity. No Cyanosis, Clubbing , mild pitting edema, No new Rash or bruise  Data Review   CBC w Diff:  Lab Results  Component Value Date   WBC 7.6 10/10/2015   WBC 4.7 06/18/2011   HGB 10.9* 10/10/2015   HGB 10.0* 06/18/2011   HCT 34.7* 10/10/2015   HCT 31.2* 06/18/2011   PLT 166 10/10/2015   LYMPHOPCT 17 10/06/2015   MONOPCT 8 10/06/2015   EOSPCT 1 10/06/2015   BASOPCT 0 10/06/2015    CMP:  Lab Results  Component Value Date   NA 137 10/11/2015   K 3.9 10/11/2015   CL 101 10/11/2015   CO2 29 10/11/2015   BUN 25* 10/11/2015   CREATININE 0.89 10/11/2015   CREATININE 0.81 05/01/2015   PROT 6.1* 10/07/2015   ALBUMIN 3.4* 10/07/2015   BILITOT 0.7 10/07/2015   ALKPHOS 61 10/07/2015   AST 44* 10/07/2015   ALT 58* 10/07/2015  .   Total Time in preparing paper work, data evaluation and todays exam - 35 minutes  Holiday Mcmenamin M.D on 10/11/2015 at 4:42 PM  [facility]   Office  [phone]

## 2015-10-11 NOTE — Clinical Social Work Note (Signed)
Per MD patient ready to DC back to [facility]. RN, patient/family (Son Welch), and facility [facility] and [place] with facility) notified of patient's DC. RN given number for report. DC packet on patient's chart. Patient's friend Mortimer Fries transported the patient to the facility.  Patient coordinated with physician to get necessary prescriptions so that patient could get needed supply to last until Monday at noon from [facility] as the facility would not utilize their emergency pharmacy for needed medications. CSW provided the patient and Mortimer Fries with the needed prescriptions (metoprolol, losartan, and pulmecort) and provided patient with coupons supplied by in house pharmacy to assist with getting medications. [place] assisted with getting the patient a nebulizer which was also given to patient and Mortimer Fries to go to to the facility with the patient. CSW signing off at this time.   CSW signing off at this time.

## 2015-10-11 NOTE — Progress Notes (Signed)
Patient discharged to [facility] accompanied by family friends and NT via wheelchair/private car. . All personal belongings given. Prescriptions ,nebulizer machine and new medication (Eliquis)  given.  Telemetry box and IV removed prior to discharge. Patient stable no signs and symptoms of distress noted.

## 2015-10-15 ENCOUNTER — Encounter: Payer: Self-pay | Admitting: Internal Medicine

## 2015-10-15 ENCOUNTER — Ambulatory Visit (INDEPENDENT_AMBULATORY_CARE_PROVIDER_SITE_OTHER): Payer: Medicare Other | Admitting: Internal Medicine

## 2015-10-15 VITALS — BP 110/60 | HR 50 | Ht 61.0 in | Wt 127.4 lb

## 2015-10-15 DIAGNOSIS — I4892 Unspecified atrial flutter: Secondary | ICD-10-CM

## 2015-10-15 DIAGNOSIS — R0602 Shortness of breath: Secondary | ICD-10-CM

## 2015-10-15 LAB — NITRIC OXIDE: Nitric Oxide: 16

## 2015-10-15 NOTE — Patient Instructions (Signed)
Ok to stop the pulmocort nebulizer for now  Only use your albuterol as a rescue medication to be used if you can't catch your breath by resting or doing a relaxed purse lip breathing pattern.  - The less you use it, the better it will work when you need it. - Ok to use up to 2 puffs  every 4 hours if you must but call for immediate appointment if use goes up over your usual need - Don't leave home without it !!  (think of it like the spare tire for your car)   If you get more short of breath when you don't have increased leg swelling then we need to see you back to re-evaluate the issue of asthma but in restrospect all your problems appear to be related to too much lung water build up related to your heart and follow up with Dr Stanford Breed is most appropriate

## 2015-10-15 NOTE — Progress Notes (Signed)
Subjective:    Patient ID: Glenda [name], female    DOB: [birth year],     MRN: [id number]  HPI  43 yowm never smoker with new onset spring 2017 palpitations then sob referred to pulmonary clinic 10/15/2015 by [facility] p admit ? asthma  Admission date:  10/06/2015     Discharge Date:  10/11/2015   \Discharge Diagnosis  Sinus tachycardia (HCC) [R00.0] Exertional dyspnea [R06.09] Congestive heart failure, unspecified congestive heart failure chronicity, unspecified congestive heart failure type ([place]) [I50.9]     Atrial flutter (HCC)   Insomnia   Hypercholesterolemia   PVC (premature ventricular contraction)   Hypertension   Anemia   Shortness of breath   Exertional dyspnea   Paroxysmal ventricular tachycardia (HCC)   Diastolic CHF, acute on chronic (HCC)   Acute on chronic systolic CHF (congestive heart failure) ([place])     10/15/2015 1st [facility] office visit/ Camden Mazzaferro  On pulmocort 0.25 mg bid and prn saba Chief Complaint  Patient presents with  . Pulmonary Consult    Referred by [facility] for eval of dyspnea. Pt c/o DOE for the past several months. She gets winded walking from her appt to the dining hall at assisted living. She also c/o occ non prod cough.   pt sure that sense of increase in palpitations preceded worse sob but this was sev months prior to OV  and gradually worsened to point where couldn't walk 10 ft and on admit above had marked CE on cxr with bilateral R > L effusion on CTa with neg PE and improved p diuresis and cardioversion  Improved to where can walk the halls now before gives out, just started pulmocort neb x 2 days can't tell it's helping and not using saba   No obvious day to day or daytime variability or assoc excess/ purulent sputum or mucus plugs or hemoptysis or cp or chest tightness, subjective wheeze or overt sinus or hb symptoms. No unusual exp hx or h/o childhood pna/ asthma or knowledge of premature birth.  Sleeping ok    early am exacerbation  of respiratory  c/o's or need for noct saba. Also denies any obvious fluctuation of symptoms with weather or environmental changes or other aggravating or alleviating factors except as outlined above   Current Medications, Allergies, Complete Past Medical History, Past Surgical History, Family History, and Social History were reviewed in [employer] record.  ROS  The following are not active complaints unless bolded sore throat, dysphagia, dental problems, itching, sneezing,  nasal congestion or excess/ purulent secretions, ear ache,   fever, chills, sweats, unintended wt loss, classically pleuritic or exertional cp,  orthopneax 2 pillows now better pnd or leg swelling has improved but still present L >R  presyncope, palpitations, abdominal pain, anorexia, nausea, vomiting, diarrhea  or change in bowel or bladder habits, change in stools or urine, dysuria,hematuria,  rash, arthralgias, visual complaints, headache, numbness, weakness or ataxia or problems with walking or coordination,  change in mood/affect or memory.           Review of Systems  Constitutional: Negative for chills, fever and unexpected weight change.  HENT: Negative for congestion, dental problem, ear pain, nosebleeds, postnasal drip, rhinorrhea, sinus pressure, sneezing, sore throat, trouble swallowing and voice change.   Eyes: Negative for visual disturbance.  Respiratory: Positive for cough and shortness of breath. Negative for choking.   Cardiovascular: Positive for leg swelling. Negative for chest pain.  Gastrointestinal: Negative for abdominal pain,  diarrhea and vomiting.  Genitourinary: Negative for difficulty urinating.  Musculoskeletal: Negative for arthralgias.  Skin: Negative for rash.  Neurological: Negative for tremors, syncope and headaches.  Hematological: Does not bruise/bleed easily.       Objective:   Physical Exam  Pleasant amb wf nad  Wt Readings from  Last 3 Encounters:  10/15/15 127 lb 6.4 oz (57.8 kg)  10/11/15 129 lb 1.6 oz (58.6 kg)  09/16/15 127 lb (57.6 kg)    Vital signs reviewed - sats 98% RA    HEENT: nl dentition, turbinates, and oropharynx. Nl external ear canals without cough reflex   NECK :  without JVD/Nodes/TM/ nl carotid upstrokes bilaterally   LUNGS: no acc muscle use,  Nl contour chest which is clear to A and P bilaterally without cough on insp or exp maneuvers   CV:  IRRR  no s3- II-III/VI SEM  increase in P2, 1+ pitting on L/ trace R ankle edema   ABD:  soft and nontender with nl inspiratory excursion in the supine position. No bruits or organomegaly, bowel sounds nl  MS:  Nl gait/ ext warm without deformities, calf tenderness, cyanosis or clubbing No obvious joint restrictions   SKIN: warm and dry without lesions    NEURO:  alert, approp, nl sensorium with  no motor deficits       I personally reviewed images and agree with radiology impression as follows:  CXR:  10/06/15 Cardiac enlargement and chronic lung changes but no definite acute overlying pulmonary process.    EKG 10/15/2015  = SB with occ pac's    Labs ordered/ reviewed:      Chemistry      Component Value Date/Time   NA 137 10/11/2015 0426   K 3.9 10/11/2015 0426   CL 101 10/11/2015 0426   CO2 29 10/11/2015 0426   BUN 25 (H) 10/11/2015 0426   CREATININE 0.89 10/11/2015 0426   CREATININE 0.81 05/01/2015 1059      Component Value Date/Time   CALCIUM 8.6 (L) 10/11/2015 0426   ALKPHOS 61 10/07/2015 0415   AST 44 (H) 10/07/2015 0415   ALT 58 (H) 10/07/2015 0415   BILITOT 0.7 10/07/2015 0415        Lab Results  Component Value Date   WBC 7.6 10/10/2015   HGB 10.9 (L) 10/10/2015   HCT 34.7 (L) 10/10/2015   MCV 92.0 10/10/2015   PLT 166 10/10/2015     BNP down from 640 to 199 during admit above and diuresis         Assessment & Plan:

## 2015-10-15 NOTE — Assessment & Plan Note (Addendum)
She is in SB here today with PAC's > f/u with Cards planned   Note no TSH in our records since 2014 > should be checked next ov > will advise Dr Stanford Breed

## 2015-10-15 NOTE — Assessment & Plan Note (Deleted)
Echo 10/07/15  Left ventricle: The cavity size was normal. There was mild   concentric hypertrophy. Systolic function was mildly to   moderately reduced. The estimated ejection fraction was in the   range of 40% to 45%. Diffuse hypokinesis. Normal sinus rhythm was   absent. The study is not technically sufficient to allow   evaluation of LV diastolic function. Doppler parameters are   consistent with high ventricular filling pressure. - Aortic valve: Moderately calcified annulus. Trileaflet. Moderate   diffuse thickening and calcification, consistent with sclerosis.   There was trivial regurgitation. - Mitral valve: Moderate diffuse thickening of the anterior   leaflet. There was mild to moderate regurgitation. - Right atrium: The atrium was mildly dilated. - Pulmonary arteries: PA peak pressure: 42 mm Hg Spirometry 10/15/2015  No obstruction/ minimal curvature  -  FENO 10/15/2015  =   16    The overall pattern in her hx and clinical course both before during and after recent admit are much more c/w cardiac asthma than primary airways dz and she had improved before even starting the pulmocort nebs congruent with resolution of acute on chronic leg swelling with mild ongoing orthopnea rather than  noct or early am cough which would be more suggestive of asthma  rec keep her on dry side if tol and just use prn saba (over use is likely to contribute to more tachyarrthmias which may backfire in that might lead to more cardiac asthma.   Total time devoted to counseling  = 35/64m review case with pt/ discussion of options/alternatives/ personally creating written instructions  in presence of pt  then going over those specific  Instructions directly with the pt including how to use all of the meds but in particular covering each new medication in detail and the difference between the maintenance/automatic meds and the prns using an action plan format for the latter.

## 2015-10-15 NOTE — Assessment & Plan Note (Signed)
Echo 10/07/15  Left ventricle: The cavity size was normal. There was mild   concentric hypertrophy. Systolic function was mildly to   moderately reduced. The estimated ejection fraction was in the   range of 40% to 45%. Diffuse hypokinesis. Normal sinus rhythm was   absent. The study is not technically sufficient to allow   evaluation of LV diastolic function. Doppler parameters are   consistent with high ventricular filling pressure. - Aortic valve: Moderately calcified annulus. Trileaflet. Moderate   diffuse thickening and calcification, consistent with sclerosis.   There was trivial regurgitation. - Mitral valve: Moderate diffuse thickening of the anterior   leaflet. There was mild to moderate regurgitation. - Right atrium: The atrium was mildly dilated. - Pulmonary arteries: PA peak pressure: 42 mm Hg Spirometry 10/15/2015  No obstruction/ minimal curvature  -  FENO 10/15/2015  =   16    The overall pattern in her hx and clinical course both before during and after recent admit are much more c/w cardiac asthma than primary airways dz and she had improved before even starting the pulmocort nebs congruent with resolution of acute on chronic leg swelling with mild ongoing orthopnea rather than  noct or early am cough which would be more suggestive of asthma  rec keep her on dry side if tol and just use prn saba (over use is likely to contribute to more tachyarrthmias which may backfire in that might lead to more cardiac asthma.   Total time devoted to counseling  = 35/30m review case with pt/ discussion of options/alternatives/ personally creating written instructions  in presence of pt  then going over those specific  Instructions directly with the pt including how to use all of the meds but in particular covering each new medication in detail and the difference between the maintenance/automatic meds and the prns using an action plan format for the latter.

## 2015-10-16 ENCOUNTER — Encounter: Payer: Self-pay | Admitting: Cardiology

## 2015-10-16 ENCOUNTER — Encounter: Payer: Self-pay | Admitting: Internal Medicine

## 2015-11-09 NOTE — Progress Notes (Signed)
Cardiology Office Note    Date:  11/10/2015   ID:  Glenda [name], DOB [birth year], MRN [id number]  PCP:  No PCP Per Patient  Cardiologist:  Glenda [name]   Chief Complaint  Patient presents with  . Hospitalization Follow-up    seen for Gl[name], no chest pain, had shortness of breath episode a few weeks ago    History of Present Illness:  Glenda [name] is a 82 y.o. female with PMH of TIA, HTN, HLD, OA, h/o AS and chronic diastolic HF. She had a exercise treadmill test on 01/26/2011 which showed no evidence of ischemia. 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 Echocardiogram obtained in 01/2011 showed normal LV function, grade 2 diastolic dysfunction, mild AS/AI, mild MR and mild biatrial enlargement. Patient was last seen in the office on 09/16/2015, she does have some dyspnea on exertion but no orthopnea or pedal edema. She also complained of some palpitation which described as a skip but no syncope. She had occasional chest discomfort that is not related to exertion. Her palpitation was felt to be secondary to PACs and PVCs. She was instructed to continue Cardizem and metoprolol, the dosage could not be advanced due to underlying first-degree AV block and risk of bradycardia. Her amlodipine was discontinued, hydralazine increased to 25 mg 3 times a day. Outpatient echocardiogram was planned.  Patient was recently admitted to the hospital on 10/06/2015 with increased dyspnea on exertion. She had CTA of the chest which was negative for PE, however did show coronary artery calcification. Chest x-ray showed no acute edema. EKG however showed a new atrial flutter/fibrillation. She was felt to be fluid overloaded by physical exam, she was given IV Lasix for diuresis. A repeat echocardiogram was obtained on 10/07/2015 which showed EF mild to moderately reduced 40-45%, diffuse hypokinesis, moderate calcific aortic valve annulus, mild to moderate MR, PA peak pressure 42 mmHg, trivial pericardial effusion was  identified posterior to the heart. The drop in EF was felt to be related to tachycardia. Given new onset of atrial flutter, she was started on eliquis. Patient was scheduled for TEE DCCV on 10/08/2015 by Glenda [name], who felt the patient was in sinus tachycardia with 1:1 AV conduction judging by atrial appendage flow velocities. TEE did show LVEF 30-35% due to global hypokinesis, moderate MR with central jet, no thrombus was seen. She did receive a synchronized biphasic 100 J shock followed by sinus bradycardia and PACs. Patient was seen on 10/10/2015, she was remaining in sinus rhythm. It was recommended the patient continue anticoagulation therapy for now. She was discharged on 40mg  PO lasix. She was referred to Dr. Melvyn [name] Pulmonology for further evaluation of dyspnea. She underwent spirometry on 10/15/2015 which showed moderately severe restrictive disease. Per Dr. Melvyn [name], it was felt the patient's dyspnea is not due to primary airway disease but more related to cardiac asthma and buildup of pulmonary edema.   Therefore patient presents today for cardiology evaluation. Although discharge paperwork recommended one week CBC and BMET, I do not see those labs being done. She has been living in [name], her daughter is visiting from another state. She has been feeling well since discharge. She still feels occasional skipped beats, however nothing persistent that remind her of her tachycardia during her recent admission. She denies any chest pain. She says she is no more short of breath than usual. She denies any obvious bleeding issues on eliquis. Her medication was adjusted in [name]. Her Lasix has been decreased to  20 mg daily. Her Lipitor 20 mg daily was stopped. Her losartan 25 mg daily was also stopped. The recent CTA of the chest although did not show PE, it did show coronary calcifications. I think it would be beneficial for her to restart her on the low-dose Lipitor. I would also recommend to restart  losartan 25 mg daily at nighttime to avoid significant dizziness. Her metoprolol was decreased to 25mg  daily instead of BID, I have changed it to 12.5mg  BID to allow a more even distribution of blood pressure. As for her Lasix, she appears to be euvolemic on this lower dose of 20 mg daily Lasix, I am okay with continuing it for now.  I will discuss with Glenda [name] at the patient's primary cardiologist regarding whether or not to continue on the eliquis. It is unclear he she truly had atrial flutter, initial EKG in the hospital does look like 2:1 atrial flutter, during TEE cardioversion by Glenda [name], it was mentioned the patient appears to be having one-to-one conduction which make it likely atrial tachycardia instead. However the patient did go through with cardioversion with successful conversion to sinus bradycardia, the question arises again why would sinus tachycardia be responsive to cardioversion unless SVT? Since she does not have any obvious angina symptom, as for her LV dysfunction, I am more inclined to obtain repeat limited echocardiogram to reassess ejection fraction in 2 months before considering a Myoview as outpatient. I will also obtain CBC, basic metabolic panel and a TSH today. She says that she has been having trouble obtaining eliquis, we have given her samples while Glenda [name] obtain eliquis medication for her.    Past Medical History:  Diagnosis Date  . Aortic stenosis    Echo 02/2011 showing mild AS with normal LV systolic function  . Arthritis    "lower back" (10/09/2015)  . Atrial flutter ([place])   . CHF (congestive heart failure) ([place])   . Chronic bronchitis ([place])    "off and on; several years" (10/09/2015)  . Chronic lower back pain   . Complication of anesthesia    "they had trouble waking me up after colon resection"  . DDD (degenerative disc disease), lumbar    severe facet dz and adv DDD MRI L spine 2009  . Diverticulosis   . GERD (gastroesophageal reflux  disease)   . Hiatal hernia    hx  . Hypercholesterolemia   . Hypertension   . Insomnia   . OA (osteoarthritis) of knee   . Peripheral edema   . PVCs (premature ventricular contractions)   . Sacral fracture ([place]) 11/06/2012  . TIA (transient ischemic attack)    pt denies this hx on 10/09/2015 "it was the [place] I was taking; they had thought I was having a stroke"    Past Surgical History:  Procedure Laterality Date  . BREAST BIOPSY Left   . CARDIAC CATHETERIZATION  02/17/2001   MILD REGURGITATION. EF 60%  . CARDIOVERSION N/A 10/08/2015   Procedure: CARDIOVERSION;  Surgeon: Sanda Klein, MD;  Location: [facility] ENDOSCOPY;  Service: Cardiovascular;  Laterality: N/A;  . CATARACT EXTRACTION W/ INTRAOCULAR LENS  IMPLANT, BILATERAL Bilateral   . [place]  . COLECTOMY    . COLONOSCOPY    . EXCISIONAL HEMORRHOIDECTOMY    . INGUINAL HERNIA REPAIR Bilateral   . KYPHOPLASTY Right 09/11/2012   Procedure: Right Acrylic Sacroplasty;  Surgeon: Kristeen Miss, MD;  Location: [facility];  Service: Neurosurgery;  Laterality: Right;  Right  Acrylic  Sacroplasty  . TEE WITHOUT CARDIOVERSION N/A 10/08/2015   Procedure: TRANSESOPHAGEAL ECHOCARDIOGRAM (TEE);  Surgeon: Sanda Klein, MD;  Location: [facility] ENDOSCOPY;  Service: Cardiovascular;  Laterality: N/A;  . TOTAL KNEE ARTHROPLASTY Right   . UMBILICAL HERNIA REPAIR      Current Medications: Outpatient Medications Prior to Visit  Medication Sig Dispense Refill  . albuterol (PROAIR HFA) 108 (90 Base) MCG/ACT inhaler Inhale 2 puffs into the lungs every 6 (six) hours as needed for shortness of breath.    Marland Kitchen apixaban (ELIQUIS) 2.5 MG TABS tablet Take 1 tablet (2.5 mg total) by mouth 2 (two) times daily. 60 tablet 0  . Cholecalciferol (VITAMIN D) 1000 UNITS capsule Take 1,000 Units by mouth daily.     . Cranberry 425 MG CAPS Take 425 mg by mouth 2 (two) times daily.     Marland Kitchen docusate sodium (COLACE) 100 MG capsule Take 100 mg by mouth 2 (two) times  daily.    . fluticasone (FLONASE) 50 MCG/ACT nasal spray Place 2 sprays into both nostrils daily. 16 g 2  . furosemide (LASIX) 40 MG tablet Take 40 mg by mouth daily.    Marland Kitchen HYDROcodone-acetaminophen (NORCO/VICODIN) 5-325 MG tablet Take 1 tablet by mouth 2 (two) times daily. TAKE 1 TAB AS NEEDED FOR PAIN every 12 hours 30 tablet 0  . losartan (COZAAR) 25 MG tablet Take 1 tablet (25 mg total) by mouth daily. Please stop taking losartan 100 mg oral daily, and start taking 25 mg oral daily instead 2 tablet 0  . Melatonin 10 MG CAPS Take 10 mg by mouth at bedtime.     . Multiple Vitamins-Minerals (CEROVITE PO) Take 1 tablet by mouth daily.     Marland Kitchen omeprazole (PRILOSEC) 20 MG capsule Take 1 capsule (20 mg total) by mouth daily. May substitute with Prilosec DR if available    . ondansetron (ZOFRAN) 4 MG tablet Take 4 mg by mouth every 8 (eight) hours as needed for nausea or vomiting.    Marland Kitchen OVER THE COUNTER MEDICATION Take 10 mLs by mouth daily as needed (heartburn). Antacid original liquid    . OVER THE COUNTER MEDICATION Take 20 mLs by mouth 3 (three) times daily as needed (cough). QC Tussin CF PE    . polyethylene glycol (MIRALAX / GLYCOLAX) packet Take 17 g by mouth daily as needed (constipation). Mix in 8 oz liquid and drink    . metoprolol tartrate (LOPRESSOR) 25 MG tablet Take 1 tablet (25 mg total) by mouth 2 (two) times daily. Please stop taking 50 mg oral twice daily and start taking 25 mg oral twice daily 4 tablet 0  . atorvastatin (LIPITOR) 20 MG tablet Take 1 tablet (20 mg total) by mouth daily. (Patient not taking: Reported on 11/10/2015) 90 tablet 3  . loratadine (CLARITIN) 10 MG tablet Take 1 tablet (10 mg total) by mouth daily. (Patient not taking: Reported on 11/10/2015) 30 tablet 0  . temazepam (RESTORIL) 15 MG capsule Take 1 capsule (15 mg total) by mouth at bedtime as needed. Insomnia (Patient not taking: Reported on 11/10/2015) 30 capsule 5   No facility-administered medications prior to  visit.      Allergies:   Ace inhibitors; Amitriptyline; Avelox [moxifloxacin hcl in nacl]; Halcion [triazolam]; Pentazocine lactate; Sulfa drugs cross reactors; Trazodone and nefazodone; and Penicillins   Social History   Social History  . Marital status: Widowed    Spouse name: N/A  . Number of children: N/A  . Years of education: N/A  Social History Main Topics  . Smoking status: Never Smoker  . Smokeless tobacco: Never Used  . Alcohol use No  . Drug use: No  . Sexual activity: No   Other Topics Concern  . None   Social History Narrative  . None     Family History:  The patient's family history includes Heart disease in her mother and sister; Hypertension in her father and sister.   ROS:   Please see the history of present illness.    ROS All other systems reviewed and are negative.   PHYSICAL EXAM:   VS:  BP 126/84 (BP Location: Right Arm, Patient Position: Sitting, Cuff Size: Normal)   Pulse (!) 48   Ht 5\' 1"  (1.549 m)    GEN: Well nourished, well developed, in no acute distress  HEENT: normal  Neck: no JVD, carotid bruits, or masses Cardiac: regularly irregular with occasional skipped beats; no murmurs, rubs, or gallops,no edema  Respiratory:  clear to auscultation bilaterally, normal work of breathing GI: soft, nontender, nondistended, + BS MS: no deformity or atrophy  Skin: warm and dry, no rash Neuro:  Alert and Oriented x 3, Strength and sensation are intact Psych: euthymic mood, full affect  Wt Readings from Last 3 Encounters:  10/15/15 127 lb 6.4 oz (57.8 kg)  10/11/15 129 lb 1.6 oz (58.6 kg)  09/16/15 127 lb (57.6 kg)      Studies/Labs Reviewed:   EKG:  EKG is ordered today.  The ekg ordered today demonstrates Sinus bradycardia with heart rate 48, no significant ST-T wave changes. Frequent PACs.  Recent Labs: 10/07/2015: ALT 58 10/09/2015: B Natriuretic Peptide 199.0 10/10/2015: Hemoglobin 10.9; Magnesium 2.0; Platelets 166 10/11/2015: BUN 25;  Creatinine, Ser 0.89; Potassium 3.9; Sodium 137   Lipid Panel    Component Value Date/Time   CHOL 165 05/01/2015 1059   TRIG 59 05/01/2015 1059   HDL 85 05/01/2015 1059   CHOLHDL 1.9 05/01/2015 1059   VLDL 12 05/01/2015 1059   LDLCALC 68 05/01/2015 1059    Additional studies/ records that were reviewed today include:   Echo 02/17/2011 LV EF: 55% -  60%  ------------------------------------------------------------ Indications:   Aortic stenosis /insufficiency 424.1.  ------------------------------------------------------------ History:  PMH: Premature ventricular contractions. Dizziness. Acquired from the patient and from the patient's chart. Palpitations. Mild aortic stenosis. Risk factors: Hypertension. Dyslipidemia.  ------------------------------------------------------------ Study Conclusions  - Left ventricle: The cavity size was normal. Wall thickness was increased in a pattern of mild LVH. Systolic function was normal. The estimated ejection fraction was in the range of 55% to 60%. Wall motion was normal; there were no regional wall motion abnormalities. Features are consistent with a pseudonormal left ventricular filling pattern, with concomitant abnormal relaxation and increased filling pressure (grade 2 diastolic dysfunction). - Aortic valve: There was mild stenosis. Mild regurgitation. - Mitral valve: Mild regurgitation. - Left atrium: The atrium was mildly dilated. - Right atrium: The atrium was mildly dilated. - Atrial septum: No defect or patent foramen ovale was identified. - Pulmonary arteries: PA peak pressure: 31mm Hg (S). - Pericardium, extracardiac: A trivial pericardial effusion was identified.    Echo 10/07/2015 LV EF: 40% -   45%  ------------------------------------------------------------------- Indications:      Atrial flutter  427.32.  ------------------------------------------------------------------- History:   PMH:   Transient ischemic attack.  Risk factors: Hypertension.  Hypercholesterolemia.  ------------------------------------------------------------------- Study Conclusions  - Left ventricle: The cavity size was normal. There was mild   concentric hypertrophy. Systolic function was mildly to  moderately reduced. The estimated ejection fraction was in the   range of 40% to 45%. Diffuse hypokinesis. Normal sinus rhythm was   absent. The study is not technically sufficient to allow   evaluation of LV diastolic function. Doppler parameters are   consistent with high ventricular filling pressure. - Aortic valve: Moderately calcified annulus. Trileaflet. Moderate   diffuse thickening and calcification, consistent with sclerosis.   There was trivial regurgitation. - Mitral valve: Moderate diffuse thickening of the anterior   leaflet. There was mild to moderate regurgitation. - Right atrium: The atrium was mildly dilated. - Pulmonary arteries: PA peak pressure: 42 mm Hg (S). - Pericardium, extracardiac: A trivial pericardial effusion was   identified posterior to the heart.  Impressions:  - The right ventricular systolic pressure was increased consistent   with moderate pulmonary hypertension.     TEE DCCV 10/08/2015 LV EF: 30% -   35%  ------------------------------------------------------------------- Indications:      Atrial flutter 427.32.  ------------------------------------------------------------------- Study Conclusions  - Left ventricle: The cavity size was normal. Wall thickness was   normal. Systolic function was moderately to severely reduced. The   estimated ejection fraction was in the range of 30% to 35%.   Moderate diffuse hypokinesis with no identifiable regional   variations. - Aortic valve: No evidence of vegetation. There was mild   regurgitation. - Mitral  valve: There was moderate regurgitation directed   centrally. - Left atrium: The atrium was mildly dilated. No evidence of   thrombus in the atrial cavity or appendage. No evidence of   thrombus in the atrial cavity or appendage. No spontaneous echo   contrast was observed. The appendage was multilobulated and   dilated. Emptying velocity was normal. - Right atrium: No evidence of thrombus in the atrial cavity or   appendage. No evidence of thrombus in the atrial cavity or   appendage. No spontaneous echo contrast was observed. - Atrial septum: No defect or patent foramen ovale was identified. - Tricuspid valve: No evidence of vegetation. No evidence of   vegetation. - Pulmonic valve: No evidence of vegetation. - Pericardium, extracardiac: A trivial pericardial effusion was   identified.  Impressions:  - Review of atrial appendage Doppler flow demonstrates that the   rhythm is an atrial tachycardia with 1:1 conduction.Following   cardioversion, the rhythm was marked sinus bradycardia with   frequent PACs.   ASSESSMENT:    1. PVT (paroxysmal ventricular tachycardia) ([place])   2. Chronic combined systolic and diastolic heart failure ([place])   3. Essential hypertension   4. Hyperlipidemia      PLAN:  In order of problems listed above:  1. Sinus tach vs aflutter with RVR  - responsive to TEE DCCV, currently on eliquis, given initial thought of aflutter with elevated CHA2DS2-Vasc score of 7 (female, age, HTN, HF, TIA)  - currently in sinus brady with HR 48. No obvious bleeding issue on eliquis, we will obtain CBC. Also obtain TSH. I have her eliquis samples for now as she has completely run out of her medication and Glenda [name] is in the process of getting her more. I will discuss with Glenda [name] whether to continue her on eliquis for now.   2. chronic combined systolic and diastolic heart failure  - felt to be nonischemic in nature. Plan to proceed with medical therapy, repeat  limited echo in 3 month, if still low, plan for myoview.   - Her Lasix has been decreased to 20 mg daily by  [place]enda [name], she appears to be euvolemic on physical exam, I will continue on current dose. I will obtain BMET  2.  HTN: BP stable, his metoprolol recently decreased to 25mg  daily from BID, I will change her to 12.5 mg twice a day. I will also restart low-dose ARB given mild LV dysfunction.  3. HLD: For some reason her Lipitor 20 mg daily was discontinued, I recommend restart Lipitor given recent CTA of the chest revealing coronary calcification before she likely has some degree of coronary atherosclerosis.    Medication Adjustments/Labs and Tests Ordered: Current medicines are reviewed at length with the patient today.  Concerns regarding medicines are outlined above.  Medication changes, Labs and Tests ordered today are listed in the Patient Instructions below. Patient Instructions  Medication Instructions:  Your physician has recommended you make the following change in your medication:  1) RESUME Lipitor to 20mg  daily 2) REDUCE Metoprolol to 12.37mh twice daily 3) RESUME Losartan 25mg  daily   Labwork: Bmet, Cbc, Tsh today  Testing/Procedures: Your physician has requested that you have an echocardiogram. Echocardiography is a painless test that uses sound waves to create images of your heart. It provides your doctor with information about the size and shape of your heart and how well your heart's chambers and valves are working. This procedure takes approximately one hour. There are no restrictions for this procedure. ( To be scheduled in 2 months)    Follow-Up: Your physician recommends that you schedule a follow-up appointment in: 2 months with GlendaCrenshaw   Any Other Special Instructions Will Be Listed Below (If Applicable).     If you need a refill on your cardiac medications before your next appointment, please call your pharmacy.      Hilbert Corrigan, [place]   11/10/2015 8:16 PM    [facility] [address], [place], [place]  [address] Phone: [phone]; Fax: [phone]

## 2015-11-10 ENCOUNTER — Ambulatory Visit (INDEPENDENT_AMBULATORY_CARE_PROVIDER_SITE_OTHER): Payer: Medicare Other | Admitting: Physician Assistant

## 2015-11-10 ENCOUNTER — Encounter: Payer: Self-pay | Admitting: Physician Assistant

## 2015-11-10 ENCOUNTER — Telehealth: Payer: Self-pay | Admitting: *Deleted

## 2015-11-10 VITALS — BP 126/84 | HR 48 | Ht 61.0 in

## 2015-11-10 DIAGNOSIS — E785 Hyperlipidemia, unspecified: Secondary | ICD-10-CM

## 2015-11-10 DIAGNOSIS — I5042 Chronic combined systolic (congestive) and diastolic (congestive) heart failure: Secondary | ICD-10-CM | POA: Diagnosis not present

## 2015-11-10 DIAGNOSIS — I472 Ventricular tachycardia: Secondary | ICD-10-CM | POA: Diagnosis not present

## 2015-11-10 DIAGNOSIS — I4729 Other ventricular tachycardia: Secondary | ICD-10-CM

## 2015-11-10 DIAGNOSIS — I10 Essential (primary) hypertension: Secondary | ICD-10-CM

## 2015-11-10 MED ORDER — METOPROLOL TARTRATE 25 MG PO TABS: 12.5000 mg | ORAL_TABLET | Freq: Two times a day (BID) | ORAL | 0 refills | Status: DC

## 2015-11-10 MED ORDER — ATORVASTATIN CALCIUM 20 MG PO TABS: 20.0000 mg | ORAL_TABLET | Freq: Every day | ORAL | Status: DC

## 2015-11-10 NOTE — Telephone Encounter (Signed)
Patient saw Almyra Deforest at the [place] office today. Eliquis samples provided to patient per call from Atkins at the [place] office.

## 2015-11-10 NOTE — Patient Instructions (Addendum)
Medication Instructions:  Your physician has recommended you make the following change in your medication:  1) RESUME Lipitor to 20mg  daily 2) REDUCE Metoprolol to 12.73mh twice daily 3) RESUME Losartan 25mg  daily   Labwork: Bmet, Cbc, Tsh today  Testing/Procedures: Your physician has requested that you have an echocardiogram. Echocardiography is a painless test that uses sound waves to create images of your heart. It provides your doctor with information about the size and shape of your heart and how well your heart's chambers and valves are working. This procedure takes approximately one hour. There are no restrictions for this procedure. ( To be scheduled in 2 months)    Follow-Up: Your physician recommends that you schedule a follow-up appointment in: 2 months with Dr.Crenshaw   Any Other Special Instructions Will Be Listed Below (If Applicable).     If you need a refill on your cardiac medications before your next appointment, please call your pharmacy.

## 2015-11-11 LAB — CBC WITH DIFFERENTIAL/PLATELET
Basophils Absolute: 56 cells/uL (ref 0–200)
Basophils Relative: 1 %
Eosinophils Absolute: 168 cells/uL (ref 15–500)
Eosinophils Relative: 3 %
HCT: 41.3 % (ref 35.0–45.0)
Hemoglobin: 13.5 g/dL (ref 11.7–15.5)
Lymphocytes Relative: 33 %
Lymphs Abs: 1848 cells/uL (ref 850–3900)
MCH: 28.8 pg (ref 27.0–33.0)
MCHC: 32.7 g/dL (ref 32.0–36.0)
MCV: 88.2 fL (ref 80.0–100.0)
MPV: 9.1 fL (ref 7.5–12.5)
Monocytes Absolute: 448 cells/uL (ref 200–950)
Monocytes Relative: 8 %
Neutro Abs: 3080 cells/uL (ref 1500–7800)
Neutrophils Relative %: 55 %
Platelets: 188 10*3/uL (ref 140–400)
RBC: 4.68 MIL/uL (ref 3.80–5.10)
RDW: 14.3 % (ref 11.0–15.0)
WBC: 5.6 10*3/uL (ref 3.8–10.8)

## 2015-11-11 LAB — BASIC METABOLIC PANEL
BUN: 26 mg/dL — ABNORMAL HIGH (ref 7–25)
CO2: 27 mmol/L (ref 20–31)
Calcium: 9.1 mg/dL (ref 8.6–10.4)
Chloride: 100 mmol/L (ref 98–110)
Creat: 0.92 mg/dL — ABNORMAL HIGH (ref 0.60–0.88)
Glucose, Bld: 96 mg/dL (ref 65–99)
Potassium: 4.1 mmol/L (ref 3.5–5.3)
Sodium: 137 mmol/L (ref 135–146)

## 2015-11-11 LAB — TSH: TSH: 0.92 mIU/L

## 2015-11-12 ENCOUNTER — Telehealth: Payer: Self-pay | Admitting: Physician Assistant

## 2015-11-12 ENCOUNTER — Telehealth: Payer: Self-pay | Admitting: Cardiology

## 2015-11-12 NOTE — Telephone Encounter (Signed)
New Message  Helene Kelp from [facility]  Pt c/o medication issue:  1. Name of Medication: Eliquis   2. How are you currently taking this medication (dosage and times per day)? 2.5mg    3. Are you having a reaction (difficulty breathing--STAT)? no  4. What is your medication issue? Helene Kelp states pt was given sample of med, but there facility does not pass samples Helene Kelp would like to speak with RN for further instruction. Please call back to discuss

## 2015-11-12 NOTE — Telephone Encounter (Signed)
Spoke with teresa, there is a question if the patient is going to continue the Eliquis or not. She was seen by meng on Monday and he was to talk to dr Stanford Breed about this. The patient was given samples but they are not allowed to give samples to the patient. Will find out from meng if pt is too continue the eliquis and if she is it will need prior authorization.

## 2015-11-12 NOTE — Telephone Encounter (Signed)
Please call,concerning prescription that was written on Monday. Please ask for Hassan Rowan.

## 2015-11-12 NOTE — Telephone Encounter (Signed)
Spoke with brenda, questions regarding losartan and eliquis answered based on the office note from [place].

## 2015-11-12 NOTE — Telephone Encounter (Signed)
Let me know who I should call to obtain prior authorization?

## 2015-11-12 NOTE — Telephone Encounter (Signed)
Can you inform patient I have discussed with Dr. Stanford Breed who recommended to continue eliquis   Signed,  Almyra Deforest [place]  Pager: [phone]

## 2015-11-12 NOTE — Telephone Encounter (Signed)
Prior authorization # [id number] for eliquis. Spoke with teresa, aware pt to continue eliquis and PA was complete.

## 2015-11-13 ENCOUNTER — Telehealth: Payer: Self-pay | Admitting: Cardiology

## 2015-11-13 NOTE — Telephone Encounter (Signed)
New message ° ° ° °Pt verbalized that she is returning call for rn  °

## 2015-11-13 NOTE — Telephone Encounter (Signed)
Spoke with pt, she is aware she is to continue the eliquis and also aware PA has been completed. She is concerned about the cost. Pt assistance telephone number given and she is going to check with the pharmacy for the cost.

## 2015-11-27 ENCOUNTER — Ambulatory Visit (INDEPENDENT_AMBULATORY_CARE_PROVIDER_SITE_OTHER): Payer: Medicare Other | Admitting: Adult Health

## 2015-11-27 ENCOUNTER — Encounter: Payer: Self-pay | Admitting: Adult Health

## 2015-11-27 VITALS — BP 106/74 | Temp 98.2°F | Ht 61.0 in | Wt 124.0 lb

## 2015-11-27 DIAGNOSIS — I10 Essential (primary) hypertension: Secondary | ICD-10-CM | POA: Diagnosis not present

## 2015-11-27 DIAGNOSIS — Z7189 Other specified counseling: Secondary | ICD-10-CM | POA: Diagnosis not present

## 2015-11-27 DIAGNOSIS — E785 Hyperlipidemia, unspecified: Secondary | ICD-10-CM

## 2015-11-27 DIAGNOSIS — Z7689 Persons encountering health services in other specified circumstances: Secondary | ICD-10-CM

## 2015-11-27 NOTE — Progress Notes (Signed)
Patient presents to clinic today to establish care. She is a 82 year old female who  has a past medical history of Aortic stenosis; Arthritis; Atrial flutter ([place]); CHF (congestive heart failure) ([place]); Chronic bronchitis ([place]); Chronic lower back pain; Complication of anesthesia; DDD (degenerative disc disease), lumbar; Diverticulosis; GERD (gastroesophageal reflux disease); Hiatal hernia; Hypercholesterolemia; Hypertension; Insomnia; OA (osteoarthritis) of knee; Peripheral edema; PVCs (premature ventricular contractions); Sacral fracture ([place]) (11/06/2012); and TIA (transient ischemic attack).   Acute Concerns: Establish Care  Chronic Issues: Hypertension  - She feels as though her blood pressure is well controlled on current medications. She denies feeling lightheaded or dizzy.   Hyperlipidemia  - She is taking Lipitor on and off. She reports that her facility keeps telling her that she does not need to take it but then her doctors keep telling her that she needs to. She denies any complications with this.   Health Maintenance: Dental --Routine  Vision -- Routine  Immunizations -- UTD  Colonoscopy -- No longer needs Mammogram -- No longer needs  PAP -- No longer needs  Bone Density -- 2012  Diet: Does not follow a specific diet Exercise: Stays active   She is followed by   Cardiology - Dr. Stanford Breed Pulmonary - Dr. Melvyn Novas     Past Medical History:  Diagnosis Date  . Aortic stenosis    Echo 02/2011 showing mild AS with normal LV systolic function  . Arthritis    "lower back" (10/09/2015)  . Atrial flutter ([place])   . CHF (congestive heart failure) ([place])   . Chronic bronchitis ([place])    "off and on; several years" (10/09/2015)  . Chronic lower back pain   . Complication of anesthesia    "they had trouble waking me up after colon resection"  . DDD (degenerative disc disease), lumbar    severe facet dz and adv DDD MRI L spine 2009  . Diverticulosis   . GERD  (gastroesophageal reflux disease)   . Hiatal hernia    hx  . Hypercholesterolemia   . Hypertension   . Insomnia   . OA (osteoarthritis) of knee   . Peripheral edema   . PVCs (premature ventricular contractions)   . Sacral fracture ([place]) 11/06/2012  . TIA (transient ischemic attack)    pt denies this hx on 10/09/2015 "it was the [place] I was taking; they had thought I was having a stroke"    Past Surgical History:  Procedure Laterality Date  . BREAST BIOPSY Left   . CARDIAC CATHETERIZATION  02/17/2001   MILD REGURGITATION. EF 60%  . CARDIOVERSION N/A 10/08/2015   Procedure: CARDIOVERSION;  Surgeon: Sanda Klein, MD;  Location: [facility] ENDOSCOPY;  Service: Cardiovascular;  Laterality: N/A;  . CATARACT EXTRACTION W/ INTRAOCULAR LENS  IMPLANT, BILATERAL Bilateral   . Branch  . COLECTOMY    . COLONOSCOPY    . EXCISIONAL HEMORRHOIDECTOMY    . INGUINAL HERNIA REPAIR Bilateral   . KYPHOPLASTY Right 09/11/2012   Procedure: Right Acrylic Sacroplasty;  Surgeon: Kristeen Miss, MD;  Location: [facility]URO ORS;  Service: Neurosurgery;  Laterality: Right;  Right  Acrylic Sacroplasty  . TEE WITHOUT CARDIOVERSION N/A 10/08/2015   Procedure: TRANSESOPHAGEAL ECHOCARDIOGRAM (TEE);  Surgeon: Sanda Klein, MD;  Location: [facility] ENDOSCOPY;  Service: Cardiovascular;  Laterality: N/A;  . TOTAL KNEE ARTHROPLASTY Right   . UMBILICAL HERNIA REPAIR      Current Outpatient Prescriptions on File Prior to Visit  Medication Sig Dispense Refill  . albuterol (  PROAIR HFA) 108 (90 Base) MCG/ACT inhaler Inhale 2 puffs into the lungs every 6 (six) hours as needed for shortness of breath.    Marland Kitchen apixaban (ELIQUIS) 2.5 MG TABS tablet Take 1 tablet (2.5 mg total) by mouth 2 (two) times daily. 60 tablet 0  . atorvastatin (LIPITOR) 20 MG tablet Take 1 tablet (20 mg total) by mouth daily.    . Cholecalciferol (VITAMIN D) 1000 UNITS capsule Take 1,000 Units by mouth daily.     . Cranberry 425 MG CAPS Take 425 mg by mouth  2 (two) times daily.     Marland Kitchen docusate sodium (COLACE) 100 MG capsule Take 100 mg by mouth 2 (two) times daily.    . fluticasone (FLONASE) 50 MCG/ACT nasal spray Place 2 sprays into both nostrils daily. 16 g 2  . furosemide (LASIX) 40 MG tablet Take 40 mg by mouth daily.    Marland Kitchen HYDROcodone-acetaminophen (NORCO/VICODIN) 5-325 MG tablet Take 1 tablet by mouth 2 (two) times daily. TAKE 1 TAB AS NEEDED FOR PAIN every 12 hours 30 tablet 0  . losartan (COZAAR) 25 MG tablet Take 1 tablet (25 mg total) by mouth daily. Please stop taking losartan 100 mg oral daily, and start taking 25 mg oral daily instead 2 tablet 0  . Melatonin 10 MG CAPS Take 10 mg by mouth at bedtime.     . metoprolol tartrate (LOPRESSOR) 25 MG tablet Take 0.5 tablets (12.5 mg total) by mouth 2 (two) times daily.  0  . Multiple Vitamins-Minerals (CEROVITE PO) Take 1 tablet by mouth daily.     Marland Kitchen omeprazole (PRILOSEC) 20 MG capsule Take 1 capsule (20 mg total) by mouth daily. May substitute with Prilosec DR if available    . ondansetron (ZOFRAN) 4 MG tablet Take 4 mg by mouth every 8 (eight) hours as needed for nausea or vomiting.    Marland Kitchen OVER THE COUNTER MEDICATION Take 10 mLs by mouth daily as needed (heartburn). Antacid original liquid    . OVER THE COUNTER MEDICATION Take 20 mLs by mouth 3 (three) times daily as needed (cough). QC Tussin CF PE    . polyethylene glycol (MIRALAX / GLYCOLAX) packet Take 17 g by mouth daily as needed (constipation). Mix in 8 oz liquid and drink     No current facility-administered medications on file prior to visit.     Allergies  Allergen Reactions  . Ace Inhibitors     Cough  . Amitriptyline     hyper  . Avelox [Moxifloxacin Hcl In Nacl] Other (See Comments)    dizziness  . Halcion [Triazolam]     Doesn't recall  . Pentazocine Lactate     Doesn't recall  . Sulfa Drugs Cross Reactors     Doesn't recall  . Trazodone And Nefazodone     Not effective  . Penicillins Rash    No other information  available at this time 10/06/15    Family History  Problem Relation Age of Onset  . Heart disease Mother   . Hypertension Father   . Heart disease Sister   . Hypertension Sister   . Heart attack Neg Hx   . Stroke Neg Hx     Social History   Social History  . Marital status: Widowed    Spouse name: N/A  . Number of children: N/A  . Years of education: N/A   Occupational History  . Not on file.   Social History Main Topics  . Smoking status: Never Smoker  .  Smokeless tobacco: Never Used  . Alcohol use No  . Drug use: No  . Sexual activity: No   Other Topics Concern  . Not on file   Social History Narrative  . No narrative on file    Review of Systems  Constitutional: Negative.   HENT: Negative.   Eyes: Negative.   Respiratory: Positive for shortness of breath (chronic and occassional ).   Cardiovascular: Negative.   Gastrointestinal: Negative.   Genitourinary: Negative.   Musculoskeletal: Positive for back pain, joint pain and myalgias. Negative for neck pain.  Skin: Negative.   Neurological: Negative.   Psychiatric/Behavioral: Negative.   All other systems reviewed and are negative.   BP 106/74   Temp 98.2 F (36.8 C) (Oral)   Ht 5\' 1"  (1.549 m)   Wt 124 lb (56.2 kg)   BMI 23.43 kg/m   Physical Exam  Constitutional: She is oriented to person, place, and time and well-developed, well-nourished, and in no distress. No distress.  HENT:  Head: Normocephalic and atraumatic.  Right Ear: External ear normal.  Left Ear: External ear normal.  Nose: Nose normal.  Mouth/Throat: Oropharynx is clear and moist. No oropharyngeal exudate.  Wears bilateral hearing aids   Eyes: Conjunctivae and EOM are normal. Pupils are equal, round, and reactive to light. Right eye exhibits no discharge. Left eye exhibits no discharge. No scleral icterus.  Cardiovascular: Normal rate, regular rhythm and intact distal pulses.  Exam reveals no gallop and no friction rub.   Murmur  heard. Pulmonary/Chest: Effort normal and breath sounds normal. No respiratory distress. She has no wheezes. She has no rales. She exhibits no tenderness.  Musculoskeletal: Normal range of motion. She exhibits edema (trace bilateral edema). She exhibits no tenderness or deformity.  Neurological: She is alert and oriented to person, place, and time. She has normal reflexes. Gait normal. GCS score is 15.  Skin: Skin is warm and dry. No rash noted. She is not diaphoretic. No erythema. No pallor.  Psychiatric: Mood, memory, affect and judgment normal.  Nursing note and vitals reviewed.  Assessment/Plan:  1. Encounter to establish care - Follow up for CPE - Follow up with any acute issues - Continue to stay active   2. Hyperlipidemia - Well controlled - I am ok with her stopping Lipitor for the time being   3. Accelerated/malignant hypertension - Well controlled on current medication  - Will monitor   Dorothyann Peng, NP

## 2015-11-27 NOTE — Patient Instructions (Addendum)
It was a pleasure meeting you today!  Please make sure you get your pneumonia and flu shot at [facility] so that you can be up to date on your vaccinations.   I am here for whenever you need me, please do not hesitate to call

## 2015-12-08 ENCOUNTER — Other Ambulatory Visit: Payer: Self-pay | Admitting: Physician Assistant

## 2015-12-08 NOTE — Telephone Encounter (Signed)
Review for refill. 

## 2015-12-22 ENCOUNTER — Other Ambulatory Visit: Payer: Self-pay | Admitting: Cardiology

## 2015-12-31 ENCOUNTER — Telehealth: Payer: Self-pay | Admitting: Cardiology

## 2015-12-31 NOTE — Telephone Encounter (Signed)
LEFT DETAIL MESSAGE ON SECURE ANSWER MACHINE- YES KEEP APPOINTMENT FOR ECHO - RE-EVALUATE HEART AFTER 2 MONTHS. ANY QUESTION MAY CALL BACK.

## 2015-12-31 NOTE — Telephone Encounter (Signed)
New message       Pt thinks she had an echo this year.  She is scheduled to have another one on 01-08-16.  She is calling to see if she needs to have another one.  Please call.

## 2016-01-07 ENCOUNTER — Other Ambulatory Visit: Payer: Self-pay | Admitting: Physician Assistant

## 2016-01-07 NOTE — Telephone Encounter (Signed)
Review for refill. 

## 2016-01-08 ENCOUNTER — Other Ambulatory Visit (HOSPITAL_COMMUNITY): Payer: Medicare Other

## 2016-01-08 NOTE — Telephone Encounter (Signed)
REFILL 

## 2016-01-14 ENCOUNTER — Other Ambulatory Visit: Payer: Self-pay

## 2016-01-14 ENCOUNTER — Ambulatory Visit (HOSPITAL_COMMUNITY): Payer: Medicare Other | Attending: Cardiovascular Disease

## 2016-01-14 DIAGNOSIS — I4729 Other ventricular tachycardia: Secondary | ICD-10-CM

## 2016-01-14 DIAGNOSIS — E785 Hyperlipidemia, unspecified: Secondary | ICD-10-CM | POA: Diagnosis not present

## 2016-01-14 DIAGNOSIS — I509 Heart failure, unspecified: Secondary | ICD-10-CM | POA: Insufficient documentation

## 2016-01-14 DIAGNOSIS — I472 Ventricular tachycardia: Secondary | ICD-10-CM

## 2016-01-14 DIAGNOSIS — I083 Combined rheumatic disorders of mitral, aortic and tricuspid valves: Secondary | ICD-10-CM | POA: Insufficient documentation

## 2016-01-20 NOTE — Progress Notes (Signed)
HPI: FU hypertension and aortic stenosis. History of atypical chest pain and palpitations. She had a exercise treadmill test on 01/26/11 which showed no evidence of ischemia. 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 Admitted July 2017 with atrial flutter vs atrial tachycardia and underwent TEE guided cardioversion. Echo showed ejection fraction 40-45% at that time. Echocardiogram October 2017 showed normal LV function, mild aortic insufficiency and mitral regurgitation and mild left atrial enlargement. Since last seen, she has mild dyspnea on exertion but much improved. She has occasional palpitations but no symptoms similar to her atrial flutter. She occasionally has a vague pain in her chest that occurs both at rest and with exertion. Lasts seconds. No syncope or bleeding.  Current Outpatient Prescriptions  Medication Sig Dispense Refill  . albuterol (PROAIR HFA) 108 (90 Base) MCG/ACT inhaler Inhale 2 puffs into the lungs every 6 (six) hours as needed for shortness of breath.    Marland Kitchen apixaban (ELIQUIS) 2.5 MG TABS tablet Take 1 tablet (2.5 mg total) by mouth 2 (two) times daily. 60 tablet 4  . atorvastatin (LIPITOR) 20 MG tablet TAKE ONE TABLET BY MOUTH ONCE DAILY. 30 tablet 6  . Cholecalciferol (VITAMIN D) 1000 UNITS capsule Take 1,000 Units by mouth daily.     . Cranberry 425 MG CAPS Take 425 mg by mouth 2 (two) times daily.     Marland Kitchen docusate sodium (COLACE) 100 MG capsule Take 100 mg by mouth 2 (two) times daily.    . fluticasone (FLONASE) 50 MCG/ACT nasal spray Place 2 sprays into both nostrils daily. 16 g 2  . furosemide (LASIX) 20 MG tablet Take 20 mg by mouth every morning.    Marland Kitchen HYDROcodone-acetaminophen (NORCO/VICODIN) 5-325 MG tablet Take 1 tablet by mouth 2 (two) times daily. TAKE 1 TAB AS NEEDED FOR PAIN every 12 hours 30 tablet 0  . losartan (COZAAR) 25 MG tablet Take 1 tablet (25 mg total) by mouth at bedtime. KEEP OV. 30 tablet 0  . Melatonin 10 MG CAPS Take 10 mg by mouth at bedtime.     .  metoprolol tartrate (LOPRESSOR) 25 MG tablet TAKE (1/2) TABLET (12.5mg ) BY MOUTH TWICE DAILY. 30 tablet 6  . Multiple Vitamins-Minerals (CEROVITE PO) Take 1 tablet by mouth daily.     Marland Kitchen omeprazole (PRILOSEC) 20 MG capsule Take 1 capsule (20 mg total) by mouth daily. May substitute with Prilosec DR if available (Patient taking differently: Take 20 mg by mouth 2 (two) times daily before a meal. May substitute with Prilosec DR if available)    . ondansetron (ZOFRAN) 4 MG tablet Take 4 mg by mouth every 8 (eight) hours as needed for nausea or vomiting.    Marland Kitchen OVER THE COUNTER MEDICATION Take 10 mLs by mouth daily as needed (heartburn). Antacid original liquid    . OVER THE COUNTER MEDICATION Take 20 mLs by mouth 3 (three) times daily as needed (cough). QC Tussin CF PE    . polyethylene glycol (MIRALAX / GLYCOLAX) packet Take 17 g by mouth daily as needed (constipation). Mix in 8 oz liquid and drink     No current facility-administered medications for this visit.      Past Medical History:  Diagnosis Date  . Aortic stenosis    Echo 02/2011 showing mild AS with normal LV systolic function  . Arthritis    "lower back" (10/09/2015)  . Atrial flutter ([place])   . CHF (congestive heart failure) ([place])   . Chronic bronchitis ([place])    "  off and on; several years" (10/09/2015)  . Chronic lower back pain   . Complication of anesthesia    "they had trouble waking me up after colon resection"  . DDD (degenerative disc disease), lumbar    severe facet dz and adv DDD MRI L spine 2009  . Diverticulosis   . GERD (gastroesophageal reflux disease)   . Hiatal hernia    hx  . Hypercholesterolemia   . Hypertension   . Insomnia   . OA (osteoarthritis) of knee   . Peripheral edema   . PVCs (premature ventricular contractions)   . Sacral fracture ([place]) 11/06/2012  . TIA (transient ischemic attack)    pt denies this hx on 10/09/2015 "it was the [place] I was taking; they had thought I was having a stroke"    Past  Surgical History:  Procedure Laterality Date  . BREAST BIOPSY Left   . CARDIAC CATHETERIZATION  02/17/2001   MILD REGURGITATION. EF 60%  . CARDIOVERSION N/A 10/08/2015   Procedure: CARDIOVERSION;  Surgeon: Sanda Klein, MD;  Location: [facility] ENDOSCOPY;  Service: Cardiovascular;  Laterality: N/A;  . CATARACT EXTRACTION W/ INTRAOCULAR LENS  IMPLANT, BILATERAL Bilateral   . [place]  . COLECTOMY    . COLONOSCOPY    . EXCISIONAL HEMORRHOIDECTOMY    . INGUINAL HERNIA REPAIR Bilateral   . KYPHOPLASTY Right 09/11/2012   Procedure: Right Acrylic Sacroplasty;  Surgeon: Kristeen Miss, MD;  Location: [place] NEURO ORS;  Service: Neurosurgery;  Laterality: Right;  Right  Acrylic Sacroplasty  . TEE WITHOUT CARDIOVERSION N/A 10/08/2015   Procedure: TRANSESOPHAGEAL ECHOCARDIOGRAM (TEE);  Surgeon: Sanda Klein, MD;  Location: [facility] ENDOSCOPY;  Service: Cardiovascular;  Laterality: N/A;  . TOTAL KNEE ARTHROPLASTY Right   . UMBILICAL HERNIA REPAIR      Social History   Social History  . Marital status: Widowed    Spouse name: N/A  . Number of children: N/A  . Years of education: N/A   Occupational History  . Not on file.   Social History Main Topics  . Smoking status: Never Smoker  . Smokeless tobacco: Never Used  . Alcohol use No  . Drug use: No  . Sexual activity: No   Other Topics Concern  . Not on file   Social History Narrative   Widowed for 6 years    She has two children ( one local, and one in [place])     Family History  Problem Relation Age of Onset  . Heart disease Mother   . Hypertension Father   . Heart disease Sister   . Hypertension Sister   . Heart attack Neg Hx   . Stroke Neg Hx     ROS: no fevers or chills, productive cough, hemoptysis, dysphasia, odynophagia, melena, hematochezia, dysuria, hematuria, rash, seizure activity, orthopnea, PND, pedal edema, claudication. Remaining systems are negative.  Physical Exam: Well-developed well-nourished in no acute  distress.  Skin is warm and dry.  HEENT is normal.  Neck is supple.  Chest is clear to auscultation with normal expansion.  Cardiovascular exam is regular rate and rhythm.  Abdominal exam nontender or distended. No masses palpated. Extremities show no edema. neuro grossly intact  ECG -sinus rhythm with first-degree AV block, nonspecific ST changes.  A/P  1 Palpitations-previously felt secondary to PACs and PVCs. Continue metoprolol. I am hesitant to advance as she had difficulties with bradycardia previously.  2 hypertension-blood pressure is mildly elevated. I will increase Cozaar to 50 mg daily. Check potassium and  renal function in 1 week.  3 hyperlipidemia-continue statin.  4 history of mild aortic stenosis-not evident on most recent echo.  5 atrial flutter versus atrial tachycardia-patient remains in sinus rhythm. Continue metoprolol. Continue apixaban.   6 Chronic diastolic congestive heart failure-continue present dose of Lasix. Euvolemic on examination.   Kirk Ruths, MD

## 2016-01-23 ENCOUNTER — Encounter: Payer: Self-pay | Admitting: *Deleted

## 2016-01-23 ENCOUNTER — Other Ambulatory Visit: Payer: Self-pay

## 2016-01-23 ENCOUNTER — Telehealth: Payer: Self-pay | Admitting: Cardiology

## 2016-01-23 MED ORDER — APIXABAN 2.5 MG PO TABS: 2.5000 mg | ORAL_TABLET | Freq: Two times a day (BID) | ORAL | 4 refills | Status: DC

## 2016-01-23 NOTE — Telephone Encounter (Signed)
Patient is concerned with her high BP.  She said that it was 190/88 at lunch per the med tech at [facility].  Please call patient and advise her.

## 2016-01-23 NOTE — Telephone Encounter (Signed)
Spoke with pt, she has noticed her bp being elevated this week 160-190. She is quite anxious. She has a follow up appointment on Monday but wants to know what to do today. Okay given for patient to take an extra 1/2 of metoprolol now.will fax an order to [phone].

## 2016-01-26 ENCOUNTER — Encounter: Payer: Self-pay | Admitting: Cardiology

## 2016-01-26 ENCOUNTER — Ambulatory Visit (INDEPENDENT_AMBULATORY_CARE_PROVIDER_SITE_OTHER): Payer: Medicare Other | Admitting: Cardiology

## 2016-01-26 VITALS — BP 174/78 | HR 65 | Ht 60.0 in | Wt 125.0 lb

## 2016-01-26 DIAGNOSIS — I10 Essential (primary) hypertension: Secondary | ICD-10-CM

## 2016-01-26 DIAGNOSIS — I4892 Unspecified atrial flutter: Secondary | ICD-10-CM | POA: Diagnosis not present

## 2016-01-26 DIAGNOSIS — E7800 Pure hypercholesterolemia, unspecified: Secondary | ICD-10-CM | POA: Diagnosis not present

## 2016-01-26 MED ORDER — LOSARTAN POTASSIUM 50 MG PO TABS: 50.0000 mg | ORAL_TABLET | Freq: Every day | ORAL | 3 refills | Status: DC

## 2016-01-26 NOTE — Patient Instructions (Signed)
Medication Instructions:   INCREASE LOSARTAN TO 50 MG ONCE DAILY= 2 OF THE 25 MG TABLETS ONCE DAILY  Labwork:  Your physician recommends that you return for lab work in: [place]:  Your physician recommends that you schedule a follow-up appointment in: [place]

## 2016-01-31 ENCOUNTER — Encounter (HOSPITAL_COMMUNITY): Payer: Self-pay

## 2016-01-31 ENCOUNTER — Emergency Department (HOSPITAL_COMMUNITY)
Admission: EM | Admit: 2016-01-31 | Discharge: 2016-01-31 | Disposition: A | Discharge status: Home / Self Care | Payer: Medicare Other | Attending: Emergency Medicine | Admitting: Emergency Medicine

## 2016-01-31 DIAGNOSIS — Z96651 Presence of right artificial knee joint: Secondary | ICD-10-CM | POA: Diagnosis not present

## 2016-01-31 DIAGNOSIS — Z79899 Other long term (current) drug therapy: Secondary | ICD-10-CM | POA: Diagnosis not present

## 2016-01-31 DIAGNOSIS — Z7901 Long term (current) use of anticoagulants: Secondary | ICD-10-CM | POA: Insufficient documentation

## 2016-01-31 DIAGNOSIS — I160 Hypertensive urgency: Secondary | ICD-10-CM

## 2016-01-31 DIAGNOSIS — Z955 Presence of coronary angioplasty implant and graft: Secondary | ICD-10-CM | POA: Diagnosis not present

## 2016-01-31 DIAGNOSIS — I110 Hypertensive heart disease with heart failure: Secondary | ICD-10-CM | POA: Diagnosis present

## 2016-01-31 DIAGNOSIS — I5043 Acute on chronic combined systolic (congestive) and diastolic (congestive) heart failure: Secondary | ICD-10-CM | POA: Insufficient documentation

## 2016-01-31 DIAGNOSIS — Z8673 Personal history of transient ischemic attack (TIA), and cerebral infarction without residual deficits: Secondary | ICD-10-CM | POA: Insufficient documentation

## 2016-01-31 LAB — CBC WITH DIFFERENTIAL/PLATELET
Basophils Absolute: 0 10*3/uL (ref 0.0–0.1)
Basophils Relative: 1 %
Eosinophils Absolute: 0.3 10*3/uL (ref 0.0–0.7)
Eosinophils Relative: 6 %
HCT: 40.3 % (ref 36.0–46.0)
Hemoglobin: 13.2 g/dL (ref 12.0–15.0)
Lymphocytes Relative: 27 %
Lymphs Abs: 1.4 10*3/uL (ref 0.7–4.0)
MCH: 29.3 pg (ref 26.0–34.0)
MCHC: 32.8 g/dL (ref 30.0–36.0)
MCV: 89.6 fL (ref 78.0–100.0)
Monocytes Absolute: 0.5 10*3/uL (ref 0.1–1.0)
Monocytes Relative: 9 %
Neutro Abs: 2.9 10*3/uL (ref 1.7–7.7)
Neutrophils Relative %: 57 %
Platelets: 160 10*3/uL (ref 150–400)
RBC: 4.5 MIL/uL (ref 3.87–5.11)
RDW: 13.9 % (ref 11.5–15.5)
WBC: 5 10*3/uL (ref 4.0–10.5)

## 2016-01-31 LAB — BASIC METABOLIC PANEL
Anion gap: 11 (ref 5–15)
BUN: 26 mg/dL — ABNORMAL HIGH (ref 6–20)
CO2: 21 mmol/L — ABNORMAL LOW (ref 22–32)
Calcium: 9.2 mg/dL (ref 8.9–10.3)
Chloride: 104 mmol/L (ref 101–111)
Creatinine, Ser: 0.79 mg/dL (ref 0.44–1.00)
GFR calc Af Amer: >60 mL/min (ref >60)
GFR calc non Af Amer: >60 mL/min (ref >60)
Glucose, Bld: 116 mg/dL — ABNORMAL HIGH (ref 65–99)
Potassium: 4.4 mmol/L (ref 3.5–5.1)
Sodium: 136 mmol/L (ref 135–145)

## 2016-01-31 LAB — TROPONIN I: Troponin I: <0.03 ng/mL (ref <0.03)

## 2016-01-31 MED ORDER — LOSARTAN POTASSIUM 50 MG PO TABS
50.0000 mg | ORAL_TABLET | Freq: Once | ORAL | Status: AC
  Administered 2016-01-31: 50 mg via ORAL
  Filled 2016-01-31: qty 1

## 2016-01-31 MED ORDER — AMLODIPINE BESYLATE 5 MG PO TABS
5.0000 mg | ORAL_TABLET | Freq: Once | ORAL | Status: AC
  Administered 2016-01-31: 5 mg via ORAL
  Filled 2016-01-31: qty 1

## 2016-01-31 MED ORDER — AMLODIPINE BESYLATE 5 MG PO TABS: 5.0000 mg | ORAL_TABLET | Freq: Every day | ORAL | 0 refills | Status: DC

## 2016-01-31 MED ORDER — METOPROLOL TARTRATE 25 MG PO TABS
12.5000 mg | ORAL_TABLET | Freq: Once | ORAL | Status: AC
  Administered 2016-01-31: 12.5 mg via ORAL
  Filled 2016-01-31: qty 1

## 2016-01-31 NOTE — ED Triage Notes (Signed)
Patient comes from [facility] for hypertension for past week.  Losartan dose was doubled and given all at night.  Patient in no acute distress.  A&Ox4

## 2016-01-31 NOTE — Discharge Instructions (Signed)
You were given the first dose of Norvasc/amlodipine in the emergency department. You were also given your nightly blood pressure medicines only, this includes the losartan and metoprolol. Call your cardiologist on Monday for medication adjustment and blood pressure recheck.

## 2016-01-31 NOTE — ED Notes (Signed)
Patient Alert and oriented X4. Stable and ambulatory. Patient verbalized understanding of the discharge instructions.  Patient belongings were taken by the patient.  

## 2016-01-31 NOTE — ED Provider Notes (Signed)
[facility] DEPT Provider Note   CSN: [id number] Arrival date & time: 01/31/16  1904     History   Chief Complaint Chief Complaint  Patient presents with  . Hypertension    HPI Glenda [name] is a 82 y.o. female.  HPI  82 year old female sent in by her nursing home for elevated blood pressure. Patient tells me her blood pressure has been elevated for a couple weeks, typically in the [id number] range systolically. Tonight her blood pressure was [id number] systolic. Patient denies any current symptoms. She states that when she stands she gets briefly dizzy, this is been going on for several months. She has also had on and off brief chest aching, most recently earlier today. This has also been for quite some time. No current shortness of breath. No current symptoms at all. Denies weakness or numbness. She has been taking her blood pressure meds as prescribed. So her cardiologist last week and had her Cozaar increased from 25 mg to 50 mg. Typically takes her nightly medicines around 9 PM area and took her morning medicines without difficulty this morning.  Past Medical History:  Diagnosis Date  . Aortic stenosis    Echo 02/2011 showing mild AS with normal LV systolic function  . Arthritis    "lower back" (10/09/2015)  . Atrial flutter ([place])   . CHF (congestive heart failure) ([place])   . Chronic bronchitis ([place])    "off and on; several years" (10/09/2015)  . Chronic lower back pain   . Complication of anesthesia    "they had trouble waking me up after colon resection"  . DDD (degenerative disc disease), lumbar    severe facet dz and adv DDD MRI L spine 2009  . Diverticulosis   . GERD (gastroesophageal reflux disease)   . Hiatal hernia    hx  . Hypercholesterolemia   . Hypertension   . Insomnia   . OA (osteoarthritis) of knee   . Peripheral edema   . PVCs (premature ventricular contractions)   . Sacral fracture ([place]) 11/06/2012  . TIA (transient ischemic attack)    pt denies this hx  on 10/09/2015 "it was the Grubbs I was taking; they had thought I was having a stroke"    Patient Active Problem List   Diagnosis Date Noted  . Acute on chronic systolic CHF (congestive heart failure) ([place])   . Diastolic CHF, acute on chronic (HCC) 10/07/2015  . Exertional dyspnea 10/06/2015  . Paroxysmal ventricular tachycardia ([place])   . Atrial flutter ([place])   . Malaise and fatigue 12/20/2014  . Shortness of breath 12/13/2014  . Palpitations 12/13/2014  . Vertigo 11/19/2012  . Unspecified constipation 11/06/2012  . Accelerated/malignant hypertension 09/14/2012  . Peripheral edema 07/11/2012  . Aortic stenosis, mild 07/11/2012  . Chronic diastolic HF (heart failure) ([place]) 07/11/2012  . Osteoarthritis 04/03/2012  . Heart murmur, aortic 02/29/2012  . Anemia 06/10/2011  . PVC (premature ventricular contraction) 02/16/2011  . Dizziness 12/01/2010  . Insomnia 09/15/2010  . Hypercholesterolemia 09/15/2010    Past Surgical History:  Procedure Laterality Date  . BREAST BIOPSY Left   . CARDIAC CATHETERIZATION  02/17/2001   MILD REGURGITATION. EF 60%  . CARDIOVERSION N/A 10/08/2015   Procedure: CARDIOVERSION;  Surgeon: Sanda Klein, MD;  Location: [facility] ENDOSCOPY;  Service: Cardiovascular;  Laterality: N/A;  . CATARACT EXTRACTION W/ INTRAOCULAR LENS  IMPLANT, BILATERAL Bilateral   . [place]  . COLECTOMY    . COLONOSCOPY    . EXCISIONAL HEMORRHOIDECTOMY    .  INGUINAL HERNIA REPAIR Bilateral   . KYPHOPLASTY Right 09/11/2012   Procedure: Right Acrylic Sacroplasty;  Surgeon: Kristeen Miss, MD;  Location: [facility] NEURO ORS;  Service: Neurosurgery;  Laterality: Right;  Right  Acrylic Sacroplasty  . TEE WITHOUT CARDIOVERSION N/A 10/08/2015   Procedure: TRANSESOPHAGEAL ECHOCARDIOGRAM (TEE);  Surgeon: Sanda Klein, MD;  Location: [facility] ENDOSCOPY;  Service: Cardiovascular;  Laterality: N/A;  . TOTAL KNEE ARTHROPLASTY Right   . UMBILICAL HERNIA REPAIR      OB History    No data  available       Home Medications    Prior to Admission medications   Medication Sig Start Date End Date Taking? Authorizing Provider  albuterol (PROAIR HFA) 108 (90 Base) MCG/ACT inhaler Inhale 2 puffs into the lungs every 6 (six) hours as needed for shortness of breath.    Historical Provider, MD  amLODipine (NORVASC) 5 MG tablet Take 1 tablet (5 mg total) by mouth daily. 01/31/16   Sherwood Gambler, MD  apixaban (ELIQUIS) 2.5 MG TABS tablet Take 1 tablet (2.5 mg total) by mouth 2 (two) times daily. 01/23/16   Lelon Perla, MD  atorvastatin (LIPITOR) 20 MG tablet TAKE ONE TABLET BY MOUTH ONCE DAILY. 12/09/15   Lelon Perla, MD  Cholecalciferol (VITAMIN D) 1000 UNITS capsule Take 1,000 Units by mouth daily.  12/07/12   Darlin Coco, MD  Cranberry 425 MG CAPS Take 425 mg by mouth 2 (two) times daily.     Historical Provider, MD  docusate sodium (COLACE) 100 MG capsule Take 100 mg by mouth 2 (two) times daily.    Historical Provider, MD  fluticasone (FLONASE) 50 MCG/ACT nasal spray Place 2 sprays into both nostrils daily. 10/11/15   Silver Huguenin Elgergawy, MD  furosemide (LASIX) 20 MG tablet Take 20 mg by mouth every morning.    Historical Provider, MD  HYDROcodone-acetaminophen (NORCO/VICODIN) 5-325 MG tablet Take 1 tablet by mouth 2 (two) times daily. TAKE 1 TAB AS NEEDED FOR PAIN every 12 hours 10/11/15   Albertine Patricia, MD  losartan (COZAAR) 50 MG tablet Take 1 tablet (50 mg total) by mouth at bedtime. KEEP OV. 01/26/16   Lelon Perla, MD  Melatonin 10 MG CAPS Take 10 mg by mouth at bedtime.     Historical Provider, MD  metoprolol tartrate (LOPRESSOR) 25 MG tablet TAKE (1/2) TABLET (12.5mg ) BY MOUTH TWICE DAILY. 12/09/15   Lelon Perla, MD  Multiple Vitamins-Minerals (CEROVITE PO) Take 1 tablet by mouth daily.     Historical Provider, MD  omeprazole (PRILOSEC) 20 MG capsule Take 1 capsule (20 mg total) by mouth daily. May substitute with Prilosec DR if available Patient taking  differently: Take 20 mg by mouth 2 (two) times daily before a meal. May substitute with Prilosec DR if available 10/11/15   Albertine Patricia, MD  ondansetron (ZOFRAN) 4 MG tablet Take 4 mg by mouth every 8 (eight) hours as needed for nausea or vomiting.    Historical Provider, MD  OVER THE COUNTER MEDICATION Take 10 mLs by mouth daily as needed (heartburn). Antacid original liquid    Historical Provider, MD  OVER THE COUNTER MEDICATION Take 20 mLs by mouth 3 (three) times daily as needed (cough). QC Tussin CF PE    Historical Provider, MD  polyethylene glycol (MIRALAX / GLYCOLAX) packet Take 17 g by mouth daily as needed (constipation). Mix in 8 oz liquid and drink    Historical Provider, MD    Family History Family History  Problem Relation Age of Onset  . Heart disease Mother   . Hypertension Father   . Heart disease Sister   . Hypertension Sister   . Heart attack Neg Hx   . Stroke Neg Hx     Social History Social History  Substance Use Topics  . Smoking status: Never Smoker  . Smokeless tobacco: Never Used  . Alcohol use No     Allergies   Ace inhibitors; Amitriptyline; Avelox [moxifloxacin hcl in nacl]; Halcion [triazolam]; Pentazocine lactate; Sulfa drugs cross reactors; Trazodone and nefazodone; and Penicillins   Review of Systems Review of Systems  Respiratory: Negative for shortness of breath.   Cardiovascular: Negative for chest pain.  Gastrointestinal: Negative for vomiting.  Neurological: Negative for dizziness, weakness, numbness and headaches.  All other systems reviewed and are negative.    Physical Exam Updated Vital Signs BP 190/95   Pulse (!) 55   Resp 13   SpO2 97%   Physical Exam  Constitutional: She is oriented to person, place, and time. She appears well-developed and well-nourished. No distress.  HENT:  Head: Normocephalic and atraumatic.  Right Ear: External ear normal.  Left Ear: External ear normal.  Nose: Nose normal.  Eyes: Right eye  exhibits no discharge. Left eye exhibits no discharge.  Cardiovascular: Normal rate, regular rhythm and normal heart sounds.   Pulmonary/Chest: Effort normal and breath sounds normal.  Abdominal: Soft. She exhibits no distension. There is no tenderness.  Neurological: She is alert and oriented to person, place, and time.  CN 3-12 grossly intact. 5/5 strength in all 4 extremities. Grossly normal sensation. Normal finger to nose.   Skin: Skin is warm and dry. She is not diaphoretic.  Nursing note and vitals reviewed.    ED Treatments / Results  Labs (all labs ordered are listed, but only abnormal results are displayed) Labs Reviewed  BASIC METABOLIC PANEL - Abnormal; Notable for the following:       Result Value   CO2 21 (*)    Glucose, Bld 116 (*)    BUN 26 (*)    All other components within normal limits  TROPONIN I  CBC WITH DIFFERENTIAL/PLATELET    EKG  EKG Interpretation  Date/Time:  Saturday January 31 2016 20:18:30 EST Ventricular Rate:  60 PR Interval:    QRS Duration: 197 QT Interval:  471 QTC Calculation: 471 R Axis:   69 Text Interpretation:  Pacemaker spikes or artifacts Sinus arrhythmia Borderline prolonged PR interval Probable left ventricular hypertrophy no significant change since July 2017 Confirmed by Regenia Skeeter MD, [place] [phone]) on 01/31/2016 8:25:29 PM       Radiology No results found.  Procedures Procedures (including critical care time)  Medications Ordered in ED Medications  losartan (COZAAR) tablet 50 mg (50 mg Oral Given 01/31/16 2013)  metoprolol tartrate (LOPRESSOR) tablet 12.5 mg (12.5 mg Oral Given 01/31/16 2013)  amLODipine (NORVASC) tablet 5 mg (5 mg Oral Given 01/31/16 2112)     Initial Impression / Assessment and Plan / ED Course  I have reviewed the triage vital signs and the nursing notes.  Pertinent labs & imaging results that were available during my care of the patient were reviewed by me and considered in my medical decision  making (see chart for details).  Clinical Course as of Jan 30 2129  Sat Jan 31, 2016  1906 Asymptomatic at this time. Will give her nightly dose now, check labs, ECG, re-eval. Neuro exam benign.  [SG]  2108 Cardiology (Dr. Jeannine Boga)  recommends 5 mg norvasc, f/u with Dr. Stanford Breed. Patient remains asymptomatic.   [SG]    Clinical Course User Index [SG] Sherwood Gambler, MD    Patient remains asymptomatic. Given her nightly HTN meds as well as norvasc, f/u closely with PCP and/or cardiology. Discussed return precautions. Low suspicion for ACS, has had vague brief CP for months. No signs of CVA.  Final Clinical Impressions(s) / ED Diagnoses   Final diagnoses:  Hypertensive urgency    New Prescriptions New Prescriptions   AMLODIPINE (NORVASC) 5 MG TABLET    Take 1 tablet (5 mg total) by mouth daily.     Sherwood Gambler, MD 01/31/16 2131

## 2016-02-04 ENCOUNTER — Ambulatory Visit (INDEPENDENT_AMBULATORY_CARE_PROVIDER_SITE_OTHER): Payer: Medicare Other | Admitting: Cardiology

## 2016-02-04 ENCOUNTER — Encounter: Payer: Self-pay | Admitting: Cardiology

## 2016-02-04 VITALS — BP 152/72 | HR 62 | Ht 60.0 in | Wt 125.0 lb

## 2016-02-04 DIAGNOSIS — R002 Palpitations: Secondary | ICD-10-CM

## 2016-02-04 DIAGNOSIS — I483 Typical atrial flutter: Secondary | ICD-10-CM

## 2016-02-04 DIAGNOSIS — I5032 Chronic diastolic (congestive) heart failure: Secondary | ICD-10-CM | POA: Diagnosis not present

## 2016-02-04 DIAGNOSIS — I10 Essential (primary) hypertension: Secondary | ICD-10-CM | POA: Diagnosis not present

## 2016-02-04 DIAGNOSIS — Z7901 Long term (current) use of anticoagulants: Secondary | ICD-10-CM

## 2016-02-04 DIAGNOSIS — I350 Nonrheumatic aortic (valve) stenosis: Secondary | ICD-10-CM

## 2016-02-04 NOTE — Assessment & Plan Note (Signed)
Currently stable. Echo 01/14/16 showed an EF of 55-60% with mild LVH

## 2016-02-04 NOTE — Assessment & Plan Note (Signed)
July 2017- s/p TEE CV to NSR

## 2016-02-04 NOTE — Assessment & Plan Note (Signed)
CHADs VASc=5 

## 2016-02-04 NOTE — Assessment & Plan Note (Signed)
Seen in ED 01/31/16 with elevated B/P. Norvasc added. She has had LE edema on this in the past (not sure at what dose) but currently is doing well on it.

## 2016-02-04 NOTE — Progress Notes (Signed)
02/04/2016 Glenda [name]   October 12, 1924  [id number]  Primary Physician Dorothyann Peng, NP Primary Cardiologist: Dr Stanford Breed  HPI:   Pleasant 82 y/o female, resident of [facility],  with a history of acute CHF in setting of atrial flutter in July 2017.  Echo then showed moderate LVD. She underwent TEE CV to NSR and her echo in Oct 2017 showed her EF had improved to 55-60%. She still has "palpiatoions" but does not describe sustained tachycardia. She is on lopressor and Eliquis. She has had a tendency to bradycardia so we have not pushed her beta blocker. She just saw Dr Stanford Breed on 11/6 and was doing well, on NSR. She then presented to the ED 01/31/16 with accelerated HTN. Norvasc 5 mg was added. She is in the office today for follow up. She feels well except for occasional palpitations. Her B/P is under better control and she is tolerating Norvasc 5 mg. Her EKG shows NSR.    Current Outpatient Prescriptions  Medication Sig Dispense Refill  . amLODipine (NORVASC) 5 MG tablet Take 1 tablet (5 mg total) by mouth daily. 30 tablet 0  . apixaban (ELIQUIS) 2.5 MG TABS tablet Take 1 tablet (2.5 mg total) by mouth 2 (two) times daily. 60 tablet 4  . atorvastatin (LIPITOR) 20 MG tablet TAKE ONE TABLET BY MOUTH ONCE DAILY. 30 tablet 6  . Cholecalciferol (VITAMIN D) 1000 UNITS capsule Take 1,000 Units by mouth daily.     . Cranberry 425 MG CAPS Take 425 mg by mouth 2 (two) times daily.     Marland Kitchen docusate sodium (COLACE) 100 MG capsule Take 100 mg by mouth 2 (two) times daily.    . furosemide (LASIX) 20 MG tablet Take 20 mg by mouth every other day.     Marland Kitchen HYDROcodone-acetaminophen (NORCO/VICODIN) 5-325 MG tablet Take 1 tablet by mouth 2 (two) times daily. TAKE 1 TAB AS NEEDED FOR PAIN every 12 hours 30 tablet 0  . losartan (COZAAR) 25 MG tablet 25 mg. Take two tablets by mouth at bedtime    . losartan (COZAAR) 50 MG tablet Take 1 tablet (50 mg total) by mouth at bedtime. KEEP OV. 90 tablet  3  . metoprolol tartrate (LOPRESSOR) 25 MG tablet TAKE (1/2) TABLET (12.5mg ) BY MOUTH TWICE DAILY. 30 tablet 6  . omeprazole (PRILOSEC) 20 MG capsule Take 20 mg by mouth daily.    . polyethylene glycol (MIRALAX / GLYCOLAX) packet Take 17 g by mouth daily as needed (constipation). Mix in 8 oz liquid and drink    . temazepam (RESTORIL) 15 MG capsule Take 15 mg by mouth.    Marland Kitchen albuterol (PROAIR HFA) 108 (90 Base) MCG/ACT inhaler Inhale 2 puffs into the lungs every 6 (six) hours as needed for shortness of breath.    . fluticasone (FLONASE) 50 MCG/ACT nasal spray Place 2 sprays into both nostrils daily. 16 g 2  . Multiple Vitamins-Minerals (CEROVITE PO) Take 1 tablet by mouth daily.     . ondansetron (ZOFRAN) 4 MG tablet Take 4 mg by mouth every 8 (eight) hours as needed for nausea or vomiting.    Marland Kitchen OVER THE COUNTER MEDICATION Take 10 mLs by mouth daily as needed (heartburn). Antacid original liquid    . OVER THE COUNTER MEDICATION Take 20 mLs by mouth 3 (three) times daily as needed (cough). QC Tussin CF PE     No current facility-administered medications for this visit.     Allergies  Allergen Reactions  .  Ace Inhibitors     Cough  . Amitriptyline     hyper  . Avelox [Moxifloxacin Hcl In Nacl] Other (See Comments)    dizziness  . Halcion [Triazolam]     Doesn't recall  . Pentazocine Lactate     Doesn't recall  . Sulfa Drugs Cross Reactors     Doesn't recall  . Trazodone And Nefazodone     Not effective  . Penicillins Rash    No other information available at this time 10/06/15    Social History   Social History  . Marital status: Widowed    Spouse name: N/A  . Number of children: N/A  . Years of education: N/A   Occupational History  . Not on file.   Social History Main Topics  . Smoking status: Never Smoker  . Smokeless tobacco: Never Used  . Alcohol use No  . Drug use: No  . Sexual activity: No   Other Topics Concern  . Not on file   Social History Narrative    Widowed for 6 years    She has two children ( one local, and one in [place])      Review of Systems: General: negative for chills, fever, night sweats or weight changes.  Cardiovascular: negative for chest pain, dyspnea on exertion, edema, orthopnea, palpitations, paroxysmal nocturnal dyspnea or shortness of breath Dermatological: negative for rash Respiratory: negative for cough or wheezing Urologic: negative for hematuria Abdominal: negative for nausea, vomiting, diarrhea, bright red blood per rectum, melena, or hematemesis Neurologic: negative for visual changes, syncope, or dizziness All other systems reviewed and are otherwise negative except as noted above.    Blood pressure (!) 152/72, pulse 62, height 5' (1.524 m), weight 125 lb (56.7 kg).  General appearance: alert, cooperative, no distress and looks younger than 82 Neck: no carotid bruit and no JVD Lungs: clear to auscultation bilaterally Heart: regular rate and rhythm and soft systolic murmur Extremities: extremities normal, atraumatic, no cyanosis or edema Neurologic: Grossly normal  EKG NSR  ASSESSMENT AND PLAN:   Accelerated/malignant hypertension Seen in ED 01/31/16 with elevated B/P. Norvasc added. She has had LE edema on this in the past (not sure at what dose) but currently is doing well on it.  Atrial flutter [facility]) July 2017- s/p TEE CV to NSR  Palpitations Chronic. She does not describe sustained tachycardia.   Chronic diastolic HF (heart failure) (HCC) Currently stable. Echo 01/14/16 showed an EF of 55-60% with mild LVH  Aortic stenosis, mild See echo Oct 2017   PLAN  Same Rx. She asked about going to [place] to be with family for the [facility] and I reassured her she should be OK to go. She have the assisted living facility call us in a week or two to let us know how her B/P is doing but I wouldn't increase her Norvasc secondary to past LE edema. Keep f/u with Dr Stanford Breed in Feb.   Kerin Ransom  PA-C 02/04/2016 3:22 PM

## 2016-02-04 NOTE — Assessment & Plan Note (Signed)
See echo Oct 2017

## 2016-02-04 NOTE — Assessment & Plan Note (Signed)
Chronic. She does not describe sustained tachycardia.

## 2016-02-04 NOTE — Patient Instructions (Signed)
Medication Instructions:  Your physician recommends that you continue on your current medications as directed. Please refer to the Current Medication list given to you today.   Labwork: NONE  Testing/Procedures: NONE  Follow-Up: Your physician wants you to follow-up in: [place] DR. CRENSHAW. You will receive a reminder letter in the mail two months in advance. If you don't receive a letter, please call our office to schedule the follow-up appointment.   Any Other Special Instructions Will Be Listed Below (If Applicable).  HAVE THE NURSING FACILITY CHECK BLOOD PRESSURE AND AFTER ABOUT 2 WEEKS CALL THE OFFICE WITH READINGS TO Kerin Ransom, [place]    If you need a refill on your cardiac medications before your next appointment, please call your pharmacy.

## 2016-02-16 ENCOUNTER — Telehealth: Payer: Self-pay | Admitting: Cardiology

## 2016-02-16 NOTE — Telephone Encounter (Signed)
Reported BP and pulse should not cause dizziness; schedule paov Kirk Ruths

## 2016-02-16 NOTE — Telephone Encounter (Signed)
Glenda [name] ( [facility]) is calling in reference to Mrs. Am…Lie refusing to take some of her medication to eliminate which one could possibly be making her dizzy . If call after pm can speak with any  Med Tech . Please call   Thanks

## 2016-02-16 NOTE — Telephone Encounter (Signed)
Returned call to [place] at [facility].She was calling to report patient refusing to take Lipitor,Metoprolol,Amlodipine.Her B/P has been ranging 141/74,149/60,160/60,127/64,140/80. Pulse today 59.Advised I will send message to Dr.Crenshaw.

## 2016-02-17 NOTE — Telephone Encounter (Signed)
Spoke with wanda, Follow up scheduled

## 2016-02-19 ENCOUNTER — Encounter: Payer: Self-pay | Admitting: Nurse Practitioner

## 2016-02-19 ENCOUNTER — Telehealth: Payer: Self-pay | Admitting: Cardiology

## 2016-02-19 ENCOUNTER — Ambulatory Visit (INDEPENDENT_AMBULATORY_CARE_PROVIDER_SITE_OTHER): Payer: Medicare Other | Admitting: Nurse Practitioner

## 2016-02-19 VITALS — BP 151/74 | HR 65 | Ht 60.0 in | Wt 125.4 lb

## 2016-02-19 DIAGNOSIS — I10 Essential (primary) hypertension: Secondary | ICD-10-CM | POA: Diagnosis not present

## 2016-02-19 DIAGNOSIS — I4892 Unspecified atrial flutter: Secondary | ICD-10-CM

## 2016-02-19 DIAGNOSIS — R42 Dizziness and giddiness: Secondary | ICD-10-CM

## 2016-02-19 DIAGNOSIS — I5032 Chronic diastolic (congestive) heart failure: Secondary | ICD-10-CM | POA: Diagnosis not present

## 2016-02-19 MED ORDER — AMLODIPINE BESYLATE 2.5 MG PO TABS: 2.5000 mg | ORAL_TABLET | Freq: Every day | ORAL | 1 refills | Status: DC

## 2016-02-19 NOTE — Progress Notes (Signed)
Office Visit    Patient Name: Glenda [name] Date of Encounter: 02/19/2016  Primary Care Provider:  Dorothyann Peng, NP Primary Cardiologist:  B. Stanford Breed, MD   Chief Complaint    82 year old female with a prior history of paroxysmal atrial flutter and diastolic heart failure who presents secondary to complaints of lightheadedness.  Past Medical History    Past Medical History:  Diagnosis Date  . Aortic stenosis    Echo 02/2011 showing mild AS with normal LV systolic function  . Arthritis    "lower back" (10/09/2015)  . Chronic bronchitis ([place])    "off and on; several years" (10/09/2015)  . Chronic diastolic CHF (congestive heart failure) ([place])    a. 12/2015 Echo: EF 55-60%, mild AI/MR, mildly dil LA.  Marland Kitchen Chronic lower back pain   . Complication of anesthesia    "they had trouble waking me up after colon resection"  . DDD (degenerative disc disease), lumbar    severe facet dz and adv DDD MRI L spine 2009  . Diverticulosis   . GERD (gastroesophageal reflux disease)   . Hiatal hernia    hx  . Hypercholesterolemia   . Hypertension   . Insomnia   . OA (osteoarthritis) of knee   . Paroxysmal atrial flutter ([place])    a. 09/2015 s/p TEE/DCCV;  b. CHA2DS2VASc = 5-->Eliquis.  . Peripheral edema   . PVCs (premature ventricular contractions)   . Sacral fracture ([place]) 11/06/2012  . TIA (transient ischemic attack)    pt denies this hx on 10/09/2015 "it was the [place] I was taking; they had thought I was having a stroke"   Past Surgical History:  Procedure Laterality Date  . BREAST BIOPSY Left   . CARDIAC CATHETERIZATION  02/17/2001   MILD REGURGITATION. EF 60%  . CARDIOVERSION N/A 10/08/2015   Procedure: CARDIOVERSION;  Surgeon: Sanda Klein, MD;  Location: [facility] END[facility]COPY;  Service: Cardiovascular;  Laterality: N/A;  . CATARACT EXTRACTION W/ INTRAOCULAR LENS  IMPLANT, BILATERAL Bilateral   . [place]  . COLECTOMY    . COLONOSCOPY    . EXCISIONAL HEMORRHOIDECTOMY     . INGUINAL HERNIA REPAIR Bilateral   . KYPHOPLASTY Right 09/11/2012   Procedure: Right Acrylic Sacroplasty;  Surgeon: Kristeen Miss, MD;  Location: [facility];  Service: Neurosurgery;  Laterality: Right;  Right  Acrylic Sacroplasty  . TEE WITHOUT CARDIOVERSION N/A 10/08/2015   Procedure: TRANSESOPHAGEAL ECHOCARDIOGRAM (TEE);  Surgeon: Sanda Klein, MD;  Location: [facility] ENDOSCOPY;  Service: Cardiovascular;  Laterality: N/A;  . TOTAL KNEE ARTHROPLASTY Right   . UMBILICAL HERNIA REPAIR      Allergies  Allergies  Allergen Reactions  . Ace Inhibitors     Cough  . Amitriptyline     hyper  . Avelox [Moxifloxacin Hcl In Nacl] Other (See Comments)    dizziness  . Halcion [Triazolam]     Doesn't recall  . Pentazocine Lactate     Doesn't recall  . Sulfa Drugs Cross Reactors     Doesn't recall  . Trazodone And Nefazodone     Not effective  . Penicillins Rash    No other information available at this time 10/06/15    History of Present Illness    82 year old female with the above complex past medical history including hypertension, hyperlipidemia, diastolic heart failure, and paroxysmal atrial flutter. She was admitted in July 2017 atrial flutter and underwent successful TEE and cardioversion. She has been on low-dose eliquis since. An echo in October  2017 showed normal LV function. She has been seen several times this month. On November 11, she was in the emergency department with elevated blood pressures and amlodipine therapy was added to her regimen. She followed up on November 15, at which time she was doing reasonably well. She did note occasional palpitations. She was advised to continue beta blocker as prescribed.  She says that over the past month, she has been expressing intermittent lightheadedness. This does not occur everyday. When it does occur however it is almost exclusively after taking either her morning or evening medications. She has been taking amlodipine 5 mg and  Lopressor 12.5 mg each morning. In the evenings, she takes and Lopressor 12.5 mg with losartan 50 mg. She has tried holding one medicine or another and she can't be sure if this is changed her symptoms. For a few nights, she stopped taking her p.m. dose of metoprolol and this has resulted in an increase in palpitations. She is convinced that it is her medicines that are causing her to feel lightheaded. Blood pressures at her assisted living facility have been fairly consistently in the 130s to 140s.  She has not been having any chest pain, dyspnea, PND, orthopnea, syncope, edema, or early satiety.  Home Medications    Prior to Admission medications   Medication Sig Start Date End Date Taking? Authorizing Provider  losartan (COZAAR) 25 MG tablet Take 25 mg by mouth at bedtime.   Yes Historical Provider, MD  albuterol (PROAIR HFA) 108 (90 Base) MCG/ACT inhaler Inhale 2 puffs into the lungs every 6 (six) hours as needed for shortness of breath.    Historical Provider, MD  amLODipine (NORVASC) 2.5 MG tablet Take 1 tablet (2.5 mg total) by mouth daily. 02/19/16   Rogelia Mire, NP  apixaban (ELIQUIS) 2.5 MG TABS tablet Take 1 tablet (2.5 mg total) by mouth 2 (two) times daily. 01/23/16   Lelon Perla, MD  atorvastatin (LIPITOR) 20 MG tablet TAKE ONE TABLET BY MOUTH ONCE DAILY. 12/09/15   Lelon Perla, MD  Cholecalciferol (VITAMIN D) 1000 UNITS capsule Take 1,000 Units by mouth daily.  12/07/12   Darlin Coco, MD  metoprolol tartrate (LOPRESSOR) 25 MG tablet TAKE (1/2) TABLET (12.5mg ) BY MOUTH TWICE DAILY. 12/09/15   Lelon Perla, MD  ondansetron (ZOFRAN) 4 MG tablet Take 4 mg by mouth every 8 (eight) hours as needed for nausea or vomiting.    Historical Provider, MD  OVER THE COUNTER MEDICATION Take 20 mLs by mouth 3 (three) times daily as needed (cough). QC Tussin CF PE    Historical Provider, MD  temazepam (RESTORIL) 15 MG capsule Take 15 mg by mouth at bedtime as needed.      Historical Provider, MD    Review of Systems    Lightheadedness as outlined above. She describes this as feeling foggy. She has not had any vertigo-like symptoms. All other systems reviewed and are otherwise negative except as noted above.  Physical Exam    VS:  BP (!) 151/74   Pulse 65   Ht 5' (1.524 m)   Wt 125 lb 6.4 oz (56.9 kg)   BMI 24.49 kg/m  , BMI Body mass index is 24.49 kg/m.  Her blood pressure lying 155/77, heart rate 59; blood pressure sitting 150/76, heart rate 55; blood pressure standing 125/77, heart rate 65; blood pressure standing 3 minutes 149/80, heart rate 63. GEN: Well nourished, well developed, in no acute distress.  HEENT: normal.  Neck: Supple,  no JVD, carotid bruits, or masses. Cardiac: RRR, no murmurs, rubs, or gallops. No clubbing, cyanosis, edema.  Radials/DP/PT 2+ and equal bilaterally.  Respiratory:  Respirations regular and unlabored, clear to auscultation bilaterally. GI: Soft, nontender, nondistended, BS + x 4. MS: no deformity or atrophy. Skin: warm and dry, no rash. Neuro:  Strength and sensation are intact. Psych: Normal affect.  Accessory Clinical Findings    None  Assessment & Plan    1. Lightheadedness patient reports at least a one-month history of intermittent lightheadedness that occurs after taking either her a.m. or p.m. blood pressure medications. It appears her blood pressures throughout the day have been in the 130s to 140s. We did check orthostatics today and though she dropped her blood pressure from sitting to standing, she was not symptomatic and blood pressure returned to baseline at 3 minutes. I recommended that she not skip doses of metoprolol given history of paroxysmal atrial flutter and palpitations when not taking metoprolol. Instead, I offered that she may come down on her amlodipine to 2.5 mg daily in the morning and also come down on her losartan to 25 mg in the evening. Given prior history of hypertension and also  admission with malignant hypertension I'm certainly concerned that she may have elevation of blood pressure however she would like to try this to see if she can rid herself of lightheadedness and I think that is fair. She will continue her regular blood pressure checks at her assisted living facility and they can contact us if she starts trending up at which point, we can start adding back some of this medicine.  2. Essential hypertension: See #1.  3. Paroxysmal atrial flutter: Heart rhythm is regular today with a controlled rate of 65 bpm. She has not had any tachypalpitations recently.  She remains on beta blocker and I stressed the importance of taking this as prescribed. She is adequately with eliquis at 2.5 mg twice a day in the setting of advanced age and weight under 60 kg.  4. Chronic diastolic congestive heart failure: Normal LV function by echo in October. Heart rate is stable. Blood pressure is mildly elevated and will continue to need close follow-up at the assisted living facility.  5. Disposition: Follow-up in 3-4 weeks. A shunt or staff is to contact us if blood pressure is markedly elevated on lower dose of amlodipine and losartan.  Murray Hodgkins , NP 02/19/2016, 12:50 PM

## 2016-02-19 NOTE — Telephone Encounter (Signed)
Follow up  Fax for [facility] below is: [phone]

## 2016-02-19 NOTE — Telephone Encounter (Signed)
New Message  Glenda [name] from [facility] voiced the changed medication form needs PA/APP-Christopher Berge's signature on it.  Glenda [name] voiced it can be faxed also.  Please f/u with pt

## 2016-02-19 NOTE — Patient Instructions (Addendum)
Medication Instructions:  Your physician has recommended you make the following change in your medication:  1.  REDUCE the Amlodipine to 2.5 mg daily 2.  REDUCE the Losartan to 25 mg taking 1 tablet at bed time   Labwork: None ordered  Testing/Procedures: None ordered  Follow-Up: Your physician recommends that you schedule a follow-up appointment in: 3-4 WEEKS WITH HAO MENG, PA-C   Any Other Special Instructions Will Be Listed Below (If Applicable).   If you need a refill on your cardiac medications before your next appointment, please call your pharmacy.

## 2016-02-23 NOTE — Telephone Encounter (Signed)
Late entry - I called her facility - [facility] - to get fax number and sent notes signed by Dr. Percival Spanish ([facility]) to them Thursday 5:15pm

## 2016-03-08 ENCOUNTER — Encounter: Payer: Self-pay | Admitting: Physician Assistant

## 2016-03-08 ENCOUNTER — Ambulatory Visit (INDEPENDENT_AMBULATORY_CARE_PROVIDER_SITE_OTHER): Payer: Medicare Other | Admitting: Physician Assistant

## 2016-03-08 VITALS — BP 192/77 | HR 50 | Ht 61.0 in | Wt 127.2 lb

## 2016-03-08 DIAGNOSIS — I10 Essential (primary) hypertension: Secondary | ICD-10-CM | POA: Diagnosis not present

## 2016-03-08 DIAGNOSIS — E785 Hyperlipidemia, unspecified: Secondary | ICD-10-CM

## 2016-03-08 DIAGNOSIS — I5042 Chronic combined systolic (congestive) and diastolic (congestive) heart failure: Secondary | ICD-10-CM | POA: Diagnosis not present

## 2016-03-08 DIAGNOSIS — I4729 Other ventricular tachycardia: Secondary | ICD-10-CM

## 2016-03-08 DIAGNOSIS — I472 Ventricular tachycardia: Secondary | ICD-10-CM | POA: Diagnosis not present

## 2016-03-08 MED ORDER — METOPROLOL TARTRATE 25 MG PO TABS: 12.5000 mg | ORAL_TABLET | Freq: Every day | ORAL | 6 refills | Status: DC

## 2016-03-08 MED ORDER — LOSARTAN POTASSIUM 50 MG PO TABS: 50.0000 mg | ORAL_TABLET | Freq: Every day | ORAL | 11 refills | Status: DC

## 2016-03-08 NOTE — Progress Notes (Signed)
Cardiology Office Note    Date:  03/08/2016   ID:  Glenda [name], DOB [birth year], MRN [id number]  PCP:  Dorothyann Peng, NP  Cardiologist:  Dr. Stanford Breed  Chief Complaint  Patient presents with  . Follow-up    3-4 weeks    History of Present Illness:  Glenda [name] is a 82 y.o. female with PMH of TIA, HTN, HLD, OA, h/o AS and chronic diastolic HF. She had a exercise treadmill test on 01/26/2011 which showed no evidence of ischemia. 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 Echocardiogram obtained in 01/2011 showed normal LV function, grade 2 diastolic dysfunction, mild AS/AI, mild MR and mild biatrial enlargement. Patient was last seen in the office on 09/16/2015, she does have some dyspnea on exertion but no orthopnea or pedal edema. She also complained of some palpitation which described as a skip but no syncope. She had occasional chest discomfort that is not related to exertion. Her palpitation was felt to be secondary to PACs and PVCs. She was instructed to continue Cardizem and metoprolol, the dosage could not be advanced due to underlying first-degree AV block and risk of bradycardia. Her amlodipine was discontinued, hydralazine increased to 25 mg 3 times a day. Outpatient echocardiogram was planned.  Patient was recently admitted to the hospital on 10/06/2015 with increased dyspnea on exertion. She had CTA of the chest which was negative for PE, however did show coronary artery calcification. Chest x-ray showed no acute edema. EKG however showed a new atrial flutter/fibrillation. She was felt to be fluid overloaded by physical exam, she was given IV Lasix for diuresis. A repeat echocardiogram was obtained on 10/07/2015 which showed EF mild to moderately reduced 40-45%, diffuse hypokinesis, moderate calcific aortic valve annulus, mild to moderate MR, PA peak pressure 42 mmHg, trivial pericardial effusion was identified posterior to the heart. The drop in EF was felt to be related to tachycardia. Given new  onset of atrial flutter, she was started on eliquis. Patient was scheduled for TEE DCCV on 10/08/2015 by Dr. Sallyanne Kuster, who felt the patient was in sinus tachycardia with 1:1 AV conduction judging by atrial appendage flow velocities. TEE did show LVEF 30-35% due to global hypokinesis, moderate MR with central jet, no thrombus was seen. She did receive a synchronized biphasic 100 J shock followed by sinus bradycardia and PACs. Patient was seen on 10/10/2015, she was remaining in sinus rhythm. It was recommended the patient continue anticoagulation therapy for now. She was discharged on 40mg  PO lasix. She was referred to Dr. Melvyn Novas Pulmonology for further evaluation of dyspnea. She underwent spirometry on 10/15/2015 which showed moderately severe restrictive disease. Per Dr. Melvyn Novas, it was felt the patient's dyspnea is not due to primary airway disease but more related to cardiac asthma and buildup of pulmonary edema  I last saw the patient on 11/10/2015 for follow-up, he was doing well at the time. Her Lasix did decrease to 20 mg daily. Her Lipitor and a low-sodium were stopped at the time. Due to the fact that she had a coronary calcification on her eat CT, I restarted her on low-dose Lipitor. I changed her metoprolol to 12.5 mg twice a day and restart her on nighttime dose of losartan. Patient was last seen by Dr. Stanford Breed on 01/26/2016, her losartan was increased to 50 mg daily. She was also continued on the systemic anticoagulation at the time. Unfortunately she went to the emergency room on 01/31/2016 for hypertensive urgency. Amlodipine 5 mg was added. Since her  discharge, she has been troubled by intermittent lightheadedness which happens almost exclusively after her blood pressure medication. She take her amlodipine 5 mg and Lopressor 12.5 mg every morning and the Lopressor 12.5 mg and losartan 50 mg every evening. She has tried holding one medication or another however has not been clear if this has any noticeable  change on her symptom. She did stop taking her p.m. dose of metoprolol, however this resulted in increasing palpitation. She was last seen in the cardiology office on 02/19/2016, she is convinced it is her medication is causing the issue. Her amlodipine was reduced to 2.5 mg daily, her losartan was also reduced to 25 mg daily as well. She presents today for cardiology office reevaluation. Her blood pressure today is in the 190s, she brought her pack age from Glenda [name] home, I have reviewed her blood pressure has been consistently in the 140s to 160s at [facility]. Also it appears that her p.m. dose of Lopressor was stopped again on 02/25/2016. She is currently on 2.5 mg amlodipine in a.m. along with 12.5 mg Lopressor in AM and 25 mg losartan in PM.   Past Medical History:  Diagnosis Date  . Aortic stenosis    Echo 02/2011 showing mild AS with normal LV systolic function  . Arthritis    "lower back" (10/09/2015)  . Chronic bronchitis ([place])    "off and on; several years" (10/09/2015)  . Chronic diastolic CHF (congestive heart failure) (Glenda [name])    a. 12/2015 Echo: EF 55-60%, mild AI/MR, mildly dil LA.  Marland Kitchen Chronic lower back pain   . Complication of anesthesia    "they had trouble waking me up after colon resection"  . DDD (degenerative disc disease), lumbar    severe facet dz and adv DDD MRI L spine 2009  . Diverticulosis   . GERD (gastroesophageal reflux disease)   . Hiatal hernia    hx  . Hypercholesterolemia   . Hypertension   . Insomnia   . OA (osteoarthritis) of knee   . Paroxysmal atrial flutter ([place])    a. 09/2015 s/p TEE/DCCV;  b. CHA2DS2VASc = 5-->Eliquis.  . Peripheral edema   . PVCs (premature ventricular contractions)   . Sacral fracture ([place]) 11/06/2012  . TIA (transient ischemic attack)    pt denies this hx on 10/09/2015 "it was the [place] I was taking; they had thought I was having a stroke"    Past Surgical History:  Procedure Laterality Date  . BREAST BIOPSY Left    . CARDIAC CATHETERIZATION  02/17/2001   MILD REGURGITATION. EF 60%  . CARDIOVERSION N/A 10/08/2015   Procedure: CARDIOVERSION;  Surgeon: Sanda Klein, MD;  Location: [facility] ENDOSCOPY;  Service: Cardiovascular;  Laterality: N/A;  . CATARACT EXTRACTION W/ INTRAOCULAR LENS  IMPLANT, BILATERAL Bilateral   . [place]  . COLECTOMY    . COLONOSCOPY    . EXCISIONAL HEMORRHOIDECTOMY    . INGUINAL HERNIA REPAIR Bilateral   . KYPHOPLASTY Right 09/11/2012   Procedure: Right Acrylic Sacroplasty;  Surgeon: Kristeen Miss, MD;  Location: [facility];  Service: Neurosurgery;  Laterality: Right;  Right  Acrylic Sacroplasty  . TEE WITHOUT CARDIOVERSION N/A 10/08/2015   Procedure: TRANSESOPHAGEAL ECHOCARDIOGRAM (TEE);  Surgeon: Sanda Klein, MD;  Location: [facility] ENDOSCOPY;  Service: Cardiovascular;  Laterality: N/A;  . TOTAL KNEE ARTHROPLASTY Right   . UMBILICAL HERNIA REPAIR      Current Medications: Outpatient Medications Prior to Visit  Medication Sig Dispense Refill  . albuterol (  PROAIR HFA) 108 (90 Base) MCG/ACT inhaler Inhale 2 puffs into the lungs every 6 (six) hours as needed for shortness of breath.    Marland Kitchen amLODipine (NORVASC) 2.5 MG tablet Take 1 tablet (2.5 mg total) by mouth daily. 90 tablet 1  . apixaban (ELIQUIS) 2.5 MG TABS tablet Take 1 tablet (2.5 mg total) by mouth 2 (two) times daily. 60 tablet 4  . atorvastatin (LIPITOR) 20 MG tablet TAKE ONE TABLET BY MOUTH ONCE DAILY. 30 tablet 6  . Cholecalciferol (VITAMIN D) 1000 UNITS capsule Take 1,000 Units by mouth daily.     . furosemide (LASIX) 20 MG tablet Take 20 mg by mouth every other day.     . ondansetron (ZOFRAN) 4 MG tablet Take 4 mg by mouth every 8 (eight) hours as needed for nausea or vomiting.    Marland Kitchen OVER THE COUNTER MEDICATION Take 20 mLs by mouth 3 (three) times daily as needed (cough). QC Tussin CF PE    . temazepam (RESTORIL) 15 MG capsule Take 15 mg by mouth at bedtime as needed.     Marland Kitchen losartan (COZAAR) 25 MG tablet  Take 25 mg by mouth at bedtime.    . metoprolol tartrate (LOPRESSOR) 25 MG tablet TAKE (1/2) TABLET (12.5mg ) BY MOUTH TWICE DAILY. 30 tablet 6   No facility-administered medications prior to visit.      Allergies:   Ace inhibitors; Amitriptyline; Avelox [moxifloxacin hcl in nacl]; Halcion [triazolam]; Pentazocine lactate; Sulfa drugs cross reactors; Trazodone and nefazodone; and Penicillins   Social History   Social History  . Marital status: Widowed    Spouse name: N/A  . Number of children: N/A  . Years of education: N/A   Social History Main Topics  . Smoking status: Never Smoker  . Smokeless tobacco: Never Used  . Alcohol use No  . Drug use: No  . Sexual activity: No   Other Topics Concern  . None   Social History Narrative   Widowed for 6 years    She has two children ( one local, and one in [place])      Family History:  The patient's family history includes Heart disease in her mother and sister; Hypertension in her father and sister.   ROS:   Please see the history of present illness.    ROS All other systems reviewed and are negative.   PHYSICAL EXAM:   VS:  BP (!) 192/77   Pulse (!) 50   Ht 5\' 1"  (1.549 m)   Wt 127 lb 3.2 oz (57.7 kg)   BMI 24.03 kg/m    GEN: Well nourished, well developed, in no acute distress  HEENT: normal  Neck: no JVD, carotid bruits, or masses Cardiac: RRR; no murmurs, rubs, or gallops. Occasional crackle Respiratory:  clear to auscultation bilaterally, normal work of breathing GI: soft, nontender, nondistended, + BS MS: no deformity or atrophy  Skin: warm and dry, no rash Neuro:  Alert and Oriented x 3, Strength and sensation are intact Psych: euthymic mood, full affect  Wt Readings from Last 3 Encounters:  03/08/16 127 lb 3.2 oz (57.7 kg)  02/19/16 125 lb 6.4 oz (56.9 kg)  02/04/16 125 lb (56.7 kg)      Studies/Labs Reviewed:   EKG:  EKG is not ordered today.    Recent Labs: 10/07/2015: ALT 58 10/09/2015: B  Natriuretic Peptide 199.0 10/10/2015: Magnesium 2.0 11/10/2015: TSH 0.92 01/31/2016: BUN 26; Creatinine, Ser 0.79; Hemoglobin 13.2; Platelets 160; Potassium 4.4; Sodium 136  Lipid Panel    Component Value Date/Time   CHOL 165 05/01/2015 1059   TRIG 59 05/01/2015 1059   HDL 85 05/01/2015 1059   CHOLHDL 1.9 05/01/2015 1059   VLDL 12 05/01/2015 1059   LDLCALC 68 05/01/2015 1059    Additional studies/ records that were reviewed today include:   Echo 02/17/2011 LV EF: 55% -  60%  ------------------------------------------------------------ Indications:   Aortic stenosis /insufficiency 424.1.  ------------------------------------------------------------ History:  PMH: Premature ventricular contractions. Dizziness. Acquired from the patient and from the patient's chart. Palpitations. Mild aortic stenosis. Risk factors: Hypertension. Dyslipidemia.  ------------------------------------------------------------ Study Conclusions  - Left ventricle: The cavity size was normal. Wall thickness was increased in a pattern of mild LVH. Systolic function was normal. The estimated ejection fraction was in the range of 55% to 60%. Wall motion was normal; there were no regional wall motion abnormalities. Features are consistent with a pseudonormal left ventricular filling pattern, with concomitant abnormal relaxation and increased filling pressure (grade 2 diastolic dysfunction). - Aortic valve: There was mild stenosis. Mild regurgitation. - Mitral valve: Mild regurgitation. - Left atrium: The atrium was mildly dilated. - Right atrium: The atrium was mildly dilated. - Atrial septum: No defect or patent foramen ovale was identified. - Pulmonary arteries: PA peak pressure: 22mm Hg (S). - Pericardium, extracardiac: A trivial pericardial effusion was identified.    Echo 10/07/2015 LV EF: 40% -  45%  ------------------------------------------------------------------- Indications: Atrial flutter 427.32.  ------------------------------------------------------------------- History: PMH: Transient ischemic attack. Risk factors: Hypertension. Hypercholesterolemia.  ------------------------------------------------------------------- Study Conclusions  - Left ventricle: The cavity size was normal. There was mild concentric hypertrophy. Systolic function was mildly to moderately reduced. The estimated ejection fraction was in the range of 40% to 45%. Diffuse hypokinesis. Normal sinus rhythm was absent. The study is not technically sufficient to allow evaluation of LV diastolic function. Doppler parameters are consistent with high ventricular filling pressure. - Aortic valve: Moderately calcified annulus. Trileaflet. Moderate diffuse thickening and calcification, consistent with sclerosis. There was trivial regurgitation. - Mitral valve: Moderate diffuse thickening of the anterior leaflet. There was mild to moderate regurgitation. - Right atrium: The atrium was mildly dilated. - Pulmonary arteries: PA peak pressure: 42 mm Hg (S). - Pericardium, extracardiac: A trivial pericardial effusion was identified posterior to the heart.  Impressions:  - The right ventricular systolic pressure was increased consistent with moderate pulmonary hypertension.     TEE DCCV 10/08/2015 LV EF: 30% - 35%  ------------------------------------------------------------------- Indications: Atrial flutter 427.32.  ------------------------------------------------------------------- Study Conclusions  - Left ventricle: The cavity size was normal. Wall thickness was normal. Systolic function was moderately to severely reduced. The estimated ejection fraction was in the range of 30% to 35%. Moderate diffuse hypokinesis with no  identifiable regional variations. - Aortic valve: No evidence of vegetation. There was mild regurgitation. - Mitral valve: There was moderate regurgitation directed centrally. - Left atrium: The atrium was mildly dilated. No evidence of thrombus in the atrial cavity or appendage. No evidence of thrombus in the atrial cavity or appendage. No spontaneous echo contrast was observed. The appendage was multilobulated and dilated. Emptying velocity was normal. - Right atrium: No evidence of thrombus in the atrial cavity or appendage. No evidence of thrombus in the atrial cavity or appendage. No spontaneous echo contrast was observed. - Atrial septum: No defect or patent foramen ovale was identified. - Tricuspid valve: No evidence of vegetation. No evidence of vegetation. - Pulmonic valve: No evidence of vegetation. - Pericardium, extracardiac: A trivial pericardial effusion was  identified.  Impressions:  - Review of atrial appendage Doppler flow demonstrates that the rhythm is an atrial tachycardia with 1:1 conduction.Following cardioversion, the rhythm was marked sinus bradycardia with frequent PACs.    ASSESSMENT:    1. PVT (paroxysmal ventricular tachycardia) ([place])   2. Chronic combined systolic and diastolic heart failure ([place])   3. Essential hypertension, malignant   4. Hyperlipidemia, unspecified hyperlipidemia type      PLAN:  In order of problems listed above:  1. Sinus tach vs aflutter with RVR  - She underwent TEE and DCCV back in July 2017, still on eliquis given initial thought of atrial flutter with elevated CHA2DS2-Vasc score of 7 (female, age, HTN, HF, TIA)  - Although, per Dr. Sallyanne Kuster who administered TEE/DCCV, patient maybe was in sinus tachycardia with 1:1 AV conduction judging by atrial appendage flow velocities  - I did discuss this issue with Dr. Stanford Breed before, due to uncertainty, we have continued her on eliquis to this  point.  2. Chronic combined systolic and diastolic heart failure: No sign of heart failure on physical exam. She does have intermittent crackles in her lung and had recent cough, however she does not appears to be fluid overloaded on physical exam. She has been coughing for the past 3 days, I have asked her to monitor for another 4 days, if her condition does not improve, she will need to be seen by PCP or urgent care to rule out bronchitis.   3. Uncontrolled HTN:   - Her blood pressure medication has been adjusted by multiple provider recently. She was previously admitted for hypertensive urgency in November. She was discharged on amlodipine 5 mg in a.m., metoprolol 12.5 mg twice a day, and losartan 50 mg in p.m. We were unable to increase her metoprolol dosage due to baseline bradycardia with heart rate frequently in the 40s. Since then, due to recurrent dizziness, her amlodipine was cut back to 2.5 mg daily and losartan was cut back to 25 mg daily.   - Based on the records she brought from [place] today, it appears her p.m. dose of metoprolol has been taken off. Per patient, her dizziness also improved shortly after. I am concerned that her dizziness was related to bradycardia and not related to blood pressure issues. She is currently only on 12.5 mg daily of metoprolol, I would keep it that way for now.   - I will continue on 2.5 mg a.m. dosing of amlodipine. Her blood pressure today is in the [id number] systolic, based on the record at [place], it appears her systolic blood pressure has been ranging in the 140 to 160s over there. I will increase the p.m. dose of losartan to 50 mg daily.  - I have instructed [facility] to monitor her blood pressure twice a day, with first blood pressure 2 hours after morning med and a second blood pressure either 6 or 8 PM every day. To avoid undue anxiety causing even more elevated blood pressure, I recommended no more than 2 blood pressure measurements per  day.  4. Hyperlipidemia: For some reason, her Lipitor 20 mg daily was previously discontinued, I restarted it on a/21/2017 Y saw her given CTA of the chest revealing coronary calcification.     Medication Adjustments/Labs and Tests Ordered: Current medicines are reviewed at length with the patient today.  Concerns regarding medicines are outlined above.  Medication changes, Labs and Tests ordered today are listed in the Patient Instructions below. Patient Instructions  [name]  Tamu Golz, PA-C has recommended making the following medication changes: 1. INCREASE Losartan 50 mg daily in the evening 2. CONTINUE Amlodipine 2.5 mg and Metoprolol 12.5 mg in the morning only  Your physician has requested that you regularly monitor and record your blood pressure readings for the next 2 weeks. Please check blood pressures twice daily. First BP check should be 2 hours after morning medications. Second BP should be at either 6 pm or 8 pm.  DO NOT check BP more than twice daily and try to be consistent regarding the timing of BP check.  [name] recommends that you schedule a follow-up appointment in 3 weeks.    Hilbert Corrigan, [place]  03/08/2016 6:22 PM    [facility] [place], [place], [place]  [address] Phone: [phone]; Fax: [phone]

## 2016-03-08 NOTE — Patient Instructions (Addendum)
Glenda Deforest, PA-C has recommended making the following medication changes: 1. INCREASE Losartan 50 mg daily in the evening 2. CONTINUE Amlodipine 2.5 mg and Metoprolol 12.5 mg in the morning only  Your physician has requested that you regularly monitor and record your blood pressure readings for the next 2 weeks. Please check blood pressures twice daily. First BP check should be 2 hours after morning medications. Second BP should be at either 6 pm or 8 pm.  DO NOT check BP more than twice daily and try to be consistent regarding the timing of BP check.  [name] recommends that you schedule a follow-up appointment in 3 weeks.

## 2016-03-29 ENCOUNTER — Encounter: Payer: Self-pay | Admitting: Physician Assistant

## 2016-03-29 ENCOUNTER — Ambulatory Visit (INDEPENDENT_AMBULATORY_CARE_PROVIDER_SITE_OTHER): Payer: Medicare Other | Admitting: Physician Assistant

## 2016-03-29 VITALS — BP 163/66 | HR 45 | Ht 61.0 in | Wt 123.0 lb

## 2016-03-29 DIAGNOSIS — I10 Essential (primary) hypertension: Secondary | ICD-10-CM

## 2016-03-29 DIAGNOSIS — I4729 Other ventricular tachycardia: Secondary | ICD-10-CM

## 2016-03-29 DIAGNOSIS — E785 Hyperlipidemia, unspecified: Secondary | ICD-10-CM

## 2016-03-29 DIAGNOSIS — I5042 Chronic combined systolic (congestive) and diastolic (congestive) heart failure: Secondary | ICD-10-CM

## 2016-03-29 DIAGNOSIS — I472 Ventricular tachycardia: Secondary | ICD-10-CM

## 2016-03-29 MED ORDER — AMLODIPINE BESYLATE 5 MG PO TABS: 5.0000 mg | ORAL_TABLET | Freq: Every day | ORAL | 3 refills | Status: DC

## 2016-03-29 NOTE — Progress Notes (Signed)
Cardiology Office Note    Date:  03/29/2016   ID:  Glenda [name], DOB [birth year], MRN [id number]  PCP:  Dorothyann Peng, NP  Cardiologist:  Dr. Stanford Breed   Chief Complaint  Patient presents with  . Follow-up    seen for Dr. Stanford Breed, followup for hypertension management    History of Present Illness:  Glenda [name] is a 83 y.o. female with PMH of TIA, HTN, HLD, OA, h/o AS and chronic diastolic HF. She had a exercise treadmill test on 01/26/2011 which showed no evidence of ischemia. 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 Echocardiogram obtained in 01/2011 showed normal LV function, grade 2 diastolic dysfunction, mild AS/AI, mild MR and mild biatrial enlargement. Patient was last seen in the office on 09/16/2015, she does have some dyspnea on exertion but no orthopnea or pedal edema. She also complained of some palpitation which described as a skip but no syncope. She had occasional chest discomfort that is not related to exertion. Her palpitation was felt to be secondary to PACs and PVCs. She was instructed to continue Cardizem and metoprolol, the dosage could not be advanced due to underlying first-degree AV block and risk of bradycardia. Her amlodipine was discontinued, hydralazine increased to 25 mg 3 times a day. Outpatient echocardiogram was planned.  Patient was admitted to the hospital on 10/06/2015 with increased dyspnea on exertion. She had CTA of the chest which was negative for PE, however did show coronary artery calcification. Chest x-ray showed no acute edema. EKG however showed a new atrial flutter/fibrillation. She was felt to be fluid overloaded by physical exam, she was given IV Lasix for diuresis. A repeat echocardiogram was obtained on 10/07/2015 which showed EF mild to moderately reduced 40-45%, diffuse hypokinesis, moderate calcific aortic valve annulus, mild to moderate MR, PA peak pressure 42 mmHg, trivial pericardial effusion was identified posterior to the heart. The drop in EF was felt to  be related to tachycardia.Given new onset of atrial flutter, she was started on eliquis. Patient was scheduled for TEE DCCV on 10/08/2015 by Dr. Sallyanne Kuster, who felt the patient was insinus tachycardia with 1:1 AVconduction judging by atrial appendage flow velocities. TEE did show LVEF 30-35% due to global hypokinesis, moderate MR with central jet, no thrombus was seen. She did receive a synchronized biphasic 100 J shock followed by sinus bradycardia and PACs. Patient was seen on 10/10/2015, she was remaining in sinus rhythm. It was recommended the patient continue anticoagulation therapy for now. She was discharged on 40mg  PO lasix. She was referred to Dr. Melvyn Novas Pulmonology for further evaluation of dyspnea. She underwent spirometry on 10/15/2015 which showed moderately severe restrictive disease. Per Dr. Melvyn Novas, it was felt the patient's dyspnea is not due to primary airway disease but more related to cardiac asthma and buildup of pulmonary edema  I have seen the patient several times since then. I saw the patient back in August, her Lasix was decreased to 20 mg daily. I restarted her Lipitor at this time given sign of coronary calcification on CT. I also changed her metoprolol to 12.5 mg twice a day and restarted her on nighttime dose of losartan. She was seen by Dr. Stanford Breed in November, her losartan was increased to 50 mg daily. Forcefully she went to the emergency room on 01/31/2016 for hypertensive urgency. Amlodipine 5 mg was added. Since discharge, she has been having intermittent lightheadedness. He takes amlodipine 5 mg and the Lopressor 12.5 mg every morning. She takes her nighttime dose of  Lopressor 12.5 mg along with losartan 15 mg every evening. She did stop her p.m. dose of metoprolol, however this resulted in increasing palpitation. She was seen the cardiologist office on 02/19/2016, she was convinced it was her medication that was causing the issue. Her amlodipine was reduced to 2.5 mg daily. Her  losartan was also reduced to 25 mg daily as well. I last saw the patient again on 03/08/2016, her blood pressure was in the 190s. Based on records brought from [place] [facility], her blood pressure has been consistently in the 140s to 160s at the nursing facility. Given poorly controlled blood pressure, I increased her losartan to 50 mg every evening, she will continue on amlodipine 2.5 mg and metoprolol 12.5 mg every morning. She thinks majority of her dizziness is related to taking metoprolol at night, therefore I did not restart p.m. doses of Lopressor.  She presents today for 3 weeks blood pressure follow-up and further medication titration. Based on blood pressure reading recorded at [facility], her blood pressure continued to be elevated, however improved from the previous reading. Otherwise she has been doing quite well despite the recent cold weather.I will increase her amlodipine in the morning to 5 mg daily. I am aware that her amlodipine was initially decreased in November due to episode of dizziness, but I think the dizziness was likely related to bradycardia on the p.m. dose of Lopressor which she has since discontinued. As a trial, we increased the amlodipine, however if she has worsening dizziness afterward, I will scale her back further. Otherwise she has been doing well.    Past Medical History:  Diagnosis Date  . Aortic stenosis    Echo 02/2011 showing mild AS with normal LV systolic function  . Arthritis    "lower back" (10/09/2015)  . Chronic bronchitis ([place])    "off and on; several years" (10/09/2015)  . Chronic diastolic CHF (congestive heart failure) ([place])    a. 12/2015 Echo: EF 55-60%, mild AI/MR, mildly dil LA.  Marland Kitchen Chronic lower back pain   . Complication of anesthesia    "they had trouble waking me up after colon resection"  . DDD (degenerative disc disease), lumbar    severe facet dz and adv DDD MRI L spine 2009  . Diverticulosis   . GERD  (gastroesophageal reflux disease)   . Hiatal hernia    hx  . Hypercholesterolemia   . Hypertension   . Insomnia   . OA (osteoarthritis) of knee   . Paroxysmal atrial flutter ([place])    a. 09/2015 s/p TEE/DCCV;  b. CHA2DS2VASc = 5-->Eliquis.  . Peripheral edema   . PVCs (premature ventricular contractions)   . Sacral fracture ([place]) 11/06/2012  . TIA (transient ischemic attack)    pt denies this hx on 10/09/2015 "it was the [place] I was taking; they had thought I was having a stroke"    Past Surgical History:  Procedure Laterality Date  . BREAST BIOPSY Left   . CARDIAC CATHETERIZATION  02/17/2001   MILD REGURGITATION. EF 60%  . CARDIOVERSION N/A 10/08/2015   Procedure: CARDIOVERSION;  Surgeon: Sanda Klein, MD;  Location: [facility] ENDOSCOPY;  Service: Cardiovascular;  Laterality: N/A;  . CATARACT EXTRACTION W/ INTRAOCULAR LENS  IMPLANT, BILATERAL Bilateral   . [place]  . COLECTOMY    . COLONOSCOPY    . EXCISIONAL HEMORRHOIDECTOMY    . INGUINAL HERNIA REPAIR Bilateral   . KYPHOPLASTY Right 09/11/2012   Procedure: Right Acrylic Sacroplasty;  Surgeon: Kristeen Miss, MD;  Location: [facility] NEURO ORS;  Service: Neurosurgery;  Laterality: Right;  Right  Acrylic Sacroplasty  . TEE WITHOUT CARDIOVERSION N/A 10/08/2015   Procedure: TRANSESOPHAGEAL ECHOCARDIOGRAM (TEE);  Surgeon: Sanda Klein, MD;  Location: [facility] ENDOSCOPY;  Service: Cardiovascular;  Laterality: N/A;  . TOTAL KNEE ARTHROPLASTY Right   . UMBILICAL HERNIA REPAIR      Current Medications: Outpatient Medications Prior to Visit  Medication Sig Dispense Refill  . albuterol (PROAIR HFA) 108 (90 Base) MCG/ACT inhaler Inhale 2 puffs into the lungs every 6 (six) hours as needed for shortness of breath.    Marland Kitchen apixaban (ELIQUIS) 2.5 MG TABS tablet Take 1 tablet (2.5 mg total) by mouth 2 (two) times daily. 60 tablet 4  . atorvastatin (LIPITOR) 20 MG tablet TAKE ONE TABLET BY MOUTH ONCE DAILY. 30 tablet 6  . Cholecalciferol (VITAMIN D)  1000 UNITS capsule Take 1,000 Units by mouth daily.     . furosemide (LASIX) 20 MG tablet Take 20 mg by mouth every other day.     . losartan (COZAAR) 50 MG tablet Take 1 tablet (50 mg total) by mouth daily. 30 tablet 11  . metoprolol tartrate (LOPRESSOR) 25 MG tablet Take 0.5 tablets (12.5 mg total) by mouth daily. 30 tablet 6  . ondansetron (ZOFRAN) 4 MG tablet Take 4 mg by mouth every 8 (eight) hours as needed for nausea or vomiting.    Marland Kitchen OVER THE COUNTER MEDICATION Take 20 mLs by mouth 3 (three) times daily as needed (cough). QC Tussin CF PE    . temazepam (RESTORIL) 15 MG capsule Take 15 mg by mouth at bedtime as needed.     Marland Kitchen amLODipine (NORVASC) 2.5 MG tablet Take 1 tablet (2.5 mg total) by mouth daily. 90 tablet 1  . cyclobenzaprine (FLEXERIL) 10 MG tablet Take 10 mg by mouth 3 (three) times daily as needed.      No facility-administered medications prior to visit.      Allergies:   Ace inhibitors; Amitriptyline; Avelox [moxifloxacin hcl in nacl]; Halcion [triazolam]; Pentazocine lactate; Sulfa drugs cross reactors; Trazodone and nefazodone; and Penicillins   Social History   Social History  . Marital status: Widowed    Spouse name: N/A  . Number of children: N/A  . Years of education: N/A   Social History Main Topics  . Smoking status: Never Smoker  . Smokeless tobacco: Never Used  . Alcohol use No  . Drug use: No  . Sexual activity: No   Other Topics Concern  . None   Social History Narrative   Widowed for 6 years    She has two children ( one local, and one in [place])      Family History:  The patient's family history includes Heart disease in her mother and sister; Hypertension in her father and sister.   ROS:   Please see the history of present illness.    ROS All other systems reviewed and are negative.   PHYSICAL EXAM:   VS:  BP (!) 163/66   Pulse (!) 45   Ht 5\' 1"  (1.549 m)   Wt 123 lb (55.8 kg)   BMI 23.24 kg/m    GEN: Well nourished, well  developed, in no acute distress  HEENT: normal  Neck: no JVD, carotid bruits, or masses Cardiac: RRR; no murmurs, rubs, or gallops,no edema  Respiratory:  clear to auscultation bilaterally, normal work of breathing GI: soft, nontender, nondistended, + BS MS: no deformity or atrophy  Skin: warm and dry, no rash Neuro:  Alert and Oriented x 3, Strength and sensation are intact Psych: euthymic mood, full affect  Wt Readings from Last 3 Encounters:  03/29/16 123 lb (55.8 kg)  03/08/16 127 lb 3.2 oz (57.7 kg)  02/19/16 125 lb 6.4 oz (56.9 kg)      Studies/Labs Reviewed:   EKG:  EKG is not ordered today.   Recent Labs: 10/07/2015: ALT 58 10/09/2015: B Natriuretic Peptide 199.0 10/10/2015: Magnesium 2.0 11/10/2015: TSH 0.92 01/31/2016: BUN 26; Creatinine, Ser 0.79; Hemoglobin 13.2; Platelets 160; Potassium 4.4; Sodium 136   Lipid Panel    Component Value Date/Time   CHOL 165 05/01/2015 1059   TRIG 59 05/01/2015 1059   HDL 85 05/01/2015 1059   CHOLHDL 1.9 05/01/2015 1059   VLDL 12 05/01/2015 1059   LDLCALC 68 05/01/2015 1059    Additional studies/ records that were reviewed today include:   Echo 02/17/2011 LV EF: 55% -  60%  ------------------------------------------------------------ Indications:   Aortic stenosis /insufficiency 424.1.  ------------------------------------------------------------ History:  PMH: Premature ventricular contractions. Dizziness. Acquired from the patient and from the patient's chart. Palpitations. Mild aortic stenosis. Risk factors: Hypertension. Dyslipidemia.  ------------------------------------------------------------ Study Conclusions  - Left ventricle: The cavity size was normal. Wall thickness was increased in a pattern of mild LVH. Systolic function was normal. The estimated ejection fraction was in the range of 55% to 60%. Wall motion was normal; there were no regional wall motion abnormalities. Features  are consistent with a pseudonormal left ventricular filling pattern, with concomitant abnormal relaxation and increased filling pressure (grade 2 diastolic dysfunction). - Aortic valve: There was mild stenosis. Mild regurgitation. - Mitral valve: Mild regurgitation. - Left atrium: The atrium was mildly dilated. - Right atrium: The atrium was mildly dilated. - Atrial septum: No defect or patent foramen ovale was identified. - Pulmonary arteries: PA peak pressure: 22mm Hg (S). - Pericardium, extracardiac: A trivial pericardial effusion was identified.    Echo 10/07/2015 LV EF: 40% - 45%  ------------------------------------------------------------------- Indications: Atrial flutter 427.32.  ------------------------------------------------------------------- History: PMH: Transient ischemic attack. Risk factors: Hypertension. Hypercholesterolemia.  ------------------------------------------------------------------- Study Conclusions  - Left ventricle: The cavity size was normal. There was mild concentric hypertrophy. Systolic function was mildly to moderately reduced. The estimated ejection fraction was in the range of 40% to 45%. Diffuse hypokinesis. Normal sinus rhythm was absent. The study is not technically sufficient to allow evaluation of LV diastolic function. Doppler parameters are consistent with high ventricular filling pressure. - Aortic valve: Moderately calcified annulus. Trileaflet. Moderate diffuse thickening and calcification, consistent with sclerosis. There was trivial regurgitation. - Mitral valve: Moderate diffuse thickening of the anterior leaflet. There was mild to moderate regurgitation. - Right atrium: The atrium was mildly dilated. - Pulmonary arteries: PA peak pressure: 42 mm Hg (S). - Pericardium, extracardiac: A trivial pericardial effusion was identified posterior to the  heart.  Impressions:  - The right ventricular systolic pressure was increased consistent with moderate pulmonary hypertension.     TEE DCCV 10/08/2015 LV EF: 30% - 35%  ------------------------------------------------------------------- Indications: Atrial flutter 427.32.  ------------------------------------------------------------------- Study Conclusions  - Left ventricle: The cavity size was normal. Wall thickness was normal. Systolic function was moderately to severely reduced. The estimated ejection fraction was in the range of 30% to 35%. Moderate diffuse hypokinesis with no identifiable regional variations. - Aortic valve: No evidence of vegetation. There was mild regurgitation. - Mitral valve: There was moderate regurgitation directed centrally. - Left atrium: The atrium was mildly dilated.  No evidence of thrombus in the atrial cavity or appendage. No evidence of thrombus in the atrial cavity or appendage. No spontaneous echo contrast was observed. The appendage was multilobulated and dilated. Emptying velocity was normal. - Right atrium: No evidence of thrombus in the atrial cavity or appendage. No evidence of thrombus in the atrial cavity or appendage. No spontaneous echo contrast was observed. - Atrial septum: No defect or patent foramen ovale was identified. - Tricuspid valve: No evidence of vegetation. No evidence of vegetation. - Pulmonic valve: No evidence of vegetation. - Pericardium, extracardiac: A trivial pericardial effusion was identified.  Impressions:  - Review of atrial appendage Doppler flow demonstrates that the rhythm is an atrial tachycardia with 1:1 conduction.Following cardioversion, the rhythm was marked sinus bradycardia with frequent PACs.     ASSESSMENT:    1. PVT (paroxysmal ventricular tachycardia) ([place])   2. Chronic combined systolic and diastolic heart failure ([place])    3. Essential hypertension   4. Hyperlipidemia, unspecified hyperlipidemia type      PLAN:  In order of problems listed above:  1. Sinus tach versus atrial flutter with RVR: Continue eliquis at this point, according to Dr. Victorino December report was administered previous TEE DCCV in July 2017, she maybe was in sinus tachycardia with 11 AV conduction judging by atrial appendage flow velocity.  2. Chronic combined systolic and diastolic heart failure: No sign of heart failure on physical exam.  3. Uncontrolled hypertension: She continued to have blood pressure issue. She has been monitoring her blood pressure at [facility]. Based on reading faxed over today, her blood pressure has been elevated in the 140 to 160s range. Last night her systolic blood pressure went up to 170. She was on 5 mg daily of amlodipine which was cut back to 2.5 mg every morning due to dizziness. In retrospect, I feel her dizziness was likely caused by bradycardia after taking p.m. dose of Lopressor. In an attempt to control the blood pressure better, as a trial I will increase the amlodipine back to 5 mg daily. If she has recurrence of dizziness, we will consider other medical treatment.  4. Hyperlipidemia: I restarted her previous Lipitor during the previous office visit given CTA of her chest revealing coronary calcification.    Medication Adjustments/Labs and Tests Ordered: Current medicines are reviewed at length with the patient today.  Concerns regarding medicines are outlined above.  Medication changes, Labs and Tests ordered today are listed in the Patient Instructions below. Patient Instructions  Medication Instructions:  INCREASE- Amlodipine 5 mg daily  Labwork: None Ordered  Testing/Procedures: None Ordered  Follow-Up: Your physician recommends that you schedule a follow-up appointment in: April 2018 with Dr Stanford Breed   Any Other Special Instructions Will Be Listed Below (If  Applicable).   If you need a refill on your cardiac medications before your next appointment, please call your pharmacy.      Hilbert Corrigan, [place]  03/29/2016 11:31 PM    [facility] [address], [place], [place]  [address] Phone: [phone]; Fax: [phone]

## 2016-03-29 NOTE — Patient Instructions (Addendum)
Medication Instructions:  INCREASE- Amlodipine 5 mg daily  Labwork: None Ordered  Testing/Procedures: None Ordered  Follow-Up: Your physician recommends that you schedule a follow-up appointment in: April 2018 with Dr Stanford Breed   Any Other Special Instructions Will Be Listed Below (If Applicable).   If you need a refill on your cardiac medications before your next appointment, please call your pharmacy.

## 2016-03-30 NOTE — Progress Notes (Signed)
I agree with this plan.

## 2016-05-03 ENCOUNTER — Ambulatory Visit: Payer: Medicare Other | Admitting: Cardiology

## 2016-07-13 ENCOUNTER — Encounter: Payer: Self-pay | Admitting: Cardiology

## 2016-07-27 NOTE — Progress Notes (Signed)
HPI: FU hypertension and aortic stenosis. History of atypical chest pain and palpitations. She had a exercise treadmill test on 01/26/11 which showed no evidence of ischemia. 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 Admitted July 2017 with atrial flutter vs atrial tachycardia and underwent TEE guided cardioversion. Echo showed ejection fraction 40-45% at that time. Echocardiogram October 2017 showed normal LV function, mild aortic insufficiency and mitral regurgitation and mild left atrial enlargement. Seen recently for BP med adjustment. Since last seen, she has occasional mild dyspnea on exertion. No orthopnea, PND or pedal edema. She continues to have occasional palpitations described as a skip. Occasional chest heaviness/indigestion after eating. Also occasional vague chest discomfort unchanged.  Current Outpatient Prescriptions  Medication Sig Dispense Refill  . albuterol (PROAIR HFA) 108 (90 Base) MCG/ACT inhaler Inhale 2 puffs into the lungs every 6 (six) hours as needed for shortness of breath.    Marland Kitchen apixaban (ELIQUIS) 2.5 MG TABS tablet Take 1 tablet (2.5 mg total) by mouth 2 (two) times daily. 60 tablet 4  . Cholecalciferol (VITAMIN D) 1000 UNITS capsule Take 1,000 Units by mouth daily.     . furosemide (LASIX) 20 MG tablet Take 20 mg by mouth every other day.     . losartan (COZAAR) 50 MG tablet Take 1 tablet (50 mg total) by mouth daily. 30 tablet 11  . metoprolol tartrate (LOPRESSOR) 25 MG tablet Take 0.5 tablets (12.5 mg total) by mouth daily. 30 tablet 6  . ondansetron (ZOFRAN) 4 MG tablet Take 4 mg by mouth every 8 (eight) hours as needed for nausea or vomiting.    Marland Kitchen OVER THE COUNTER MEDICATION Take 20 mLs by mouth 3 (three) times daily as needed (cough). QC Tussin CF PE    . temazepam (RESTORIL) 15 MG capsule Take 15 mg by mouth at bedtime as needed.      No current facility-administered medications for this visit.      Past Medical History:  Diagnosis Date  . Aortic stenosis    Echo  02/2011 showing mild AS with normal LV systolic function  . Arthritis    "lower back" (10/09/2015)  . Chronic bronchitis ([place])    "off and on; several years" (10/09/2015)  . Chronic diastolic CHF (congestive heart failure) ([place])    a. 12/2015 Echo: EF 55-60%, mild AI/MR, mildly dil LA.  Marland Kitchen Chronic lower back pain   . Complication of anesthesia    "they had trouble waking me up after colon resection"  . DDD (degenerative disc disease), lumbar    severe facet dz and adv DDD MRI L spine 2009  . Diverticulosis   . GERD (gastroesophageal reflux disease)   . Hiatal hernia    hx  . Hypercholesterolemia   . Hypertension   . Insomnia   . OA (osteoarthritis) of knee   . Paroxysmal atrial flutter ([place])    a. 09/2015 s/p TEE/DCCV;  b. CHA2DS2VASc = 5-->Eliquis.  . Peripheral edema   . PVCs (premature ventricular contractions)   . Sacral fracture ([place]) 11/06/2012  . TIA (transient ischemic attack)    pt denies this hx on 10/09/2015 "it was the [place] I was taking; they had thought I was having a stroke"    Past Surgical History:  Procedure Laterality Date  . BREAST BIOPSY Left   . CARDIAC CATHETERIZATION  02/17/2001   MILD REGURGITATION. EF 60%  . CARDIOVERSION N/A 10/08/2015   Procedure: CARDIOVERSION;  Surgeon: Sanda Klein, MD;  Location: [facility];  Service:  Cardiovascular;  Laterality: N/A;  . CATARACT EXTRACTION W/ INTRAOCULAR LENS  IMPLANT, BILATERAL Bilateral   . CESAREAN SECTION  1954  . COLECTOMY    . COLONOSCOPY    . EXCISIONAL HEMORRHOIDECTOMY    . INGUINAL HERNIA REPAIR Bilateral   . KYPHOPLASTY Right 09/11/2012   Procedure: Right Acrylic Sacroplasty;  Surgeon: Kristeen Miss, MD;  Location: [facility]URO ORS;  Service: Neurosurgery;  Laterality: Right;  Right  Acrylic Sacroplasty  . TEE WITHOUT CARDIOVERSION N/A 10/08/2015   Procedure: TRANSESOPHAGEAL ECHOCARDIOGRAM (TEE);  Surgeon: Sanda Klein, MD;  Location: [facility] ENDOSCOPY;  Service: Cardiovascular;  Laterality: N/A;  . TOTAL  KNEE ARTHROPLASTY Right   . UMBILICAL HERNIA REPAIR      Social History   Social History  . Marital status: Widowed    Spouse name: N/A  . Number of children: N/A  . Years of education: N/A   Occupational History  . Not on file.   Social History Main Topics  . Smoking status: Never Smoker  . Smokeless tobacco: Never Used  . Alcohol use No  . Drug use: No  . Sexual activity: No   Other Topics Concern  . Not on file   Social History Narrative   Widowed for 6 years    She has two children ( one local, and one in [place])     Family History  Problem Relation Age of Onset  . Heart disease Mother   . Hypertension Father   . Heart disease Sister   . Hypertension Sister   . Heart attack Neg Hx   . Stroke Neg Hx     ROS: no fevers or chills, productive cough, hemoptysis, dysphasia, odynophagia, melena, hematochezia, dysuria, hematuria, rash, seizure activity, orthopnea, PND, pedal edema, claudication. Remaining systems are negative.  Physical Exam: Well-developed well-nourished in no acute distress.  Skin is warm and dry.  HEENT is normal.  Neck is supple.  Chest is clear to auscultation with normal expansion.  Cardiovascular exam is regular rate and rhythm.  Abdominal exam nontender or distended. No masses palpated. Extremities show no edema. neuro grossly intact  ECG- Sinus bradycardia at a rate of 48. First-degree AV block. Nonspecific ST changes. personally reviewed  A/P  1 Hypertension-blood pressure is controlled. Continue present medications.  2 palpitations-this is felt secondary to PACs and PVCs. We will not advance beta blocker as she has had problems with bradycardia in the past and HR 48 today; may need to decrease dose.  3 history of atrial flutter versus atrial tachycardia-patient is in sinus rhythm. Continue metoprolol and apixaban. Check hemoglobin and renal function.  4 mild aortic stenosis-not evident on most recent echocardiogram.  5  hyperlipidemia-continue statin.  6 chronic diastolic congestive heart failure-patient appears to be euvolemic. Continue present dose of diuretic. Check potassium and renal function.   Kirk Ruths, MD

## 2016-08-02 ENCOUNTER — Encounter: Payer: Self-pay | Admitting: Cardiology

## 2016-08-02 ENCOUNTER — Ambulatory Visit (INDEPENDENT_AMBULATORY_CARE_PROVIDER_SITE_OTHER): Payer: Medicare Other | Admitting: Cardiology

## 2016-08-02 VITALS — BP 118/62 | HR 48 | Ht 61.0 in | Wt 123.0 lb

## 2016-08-02 DIAGNOSIS — I10 Essential (primary) hypertension: Secondary | ICD-10-CM

## 2016-08-02 DIAGNOSIS — I5032 Chronic diastolic (congestive) heart failure: Secondary | ICD-10-CM

## 2016-08-02 DIAGNOSIS — I4892 Unspecified atrial flutter: Secondary | ICD-10-CM | POA: Diagnosis not present

## 2016-08-02 DIAGNOSIS — R002 Palpitations: Secondary | ICD-10-CM

## 2016-08-02 LAB — CBC
HCT: 39.3 % (ref 35.0–45.0)
Hemoglobin: 13 g/dL (ref 11.7–15.5)
MCH: 30 pg (ref 27.0–33.0)
MCHC: 33.1 g/dL (ref 32.0–36.0)
MCV: 90.8 fL (ref 80.0–100.0)
MPV: 8.7 fL (ref 7.5–12.5)
Platelets: 206 10*3/uL (ref 140–400)
RBC: 4.33 MIL/uL (ref 3.80–5.10)
RDW: 13.7 % (ref 11.0–15.0)
WBC: 5.7 10*3/uL (ref 3.8–10.8)

## 2016-08-02 NOTE — Patient Instructions (Signed)

## 2016-08-03 LAB — BASIC METABOLIC PANEL
BUN: 33 mg/dL — ABNORMAL HIGH (ref 7–25)
CO2: 27 mmol/L (ref 20–31)
Calcium: 9.1 mg/dL (ref 8.6–10.4)
Chloride: 100 mmol/L (ref 98–110)
Creat: 1.28 mg/dL — ABNORMAL HIGH (ref 0.60–0.88)
Glucose, Bld: 110 mg/dL — ABNORMAL HIGH (ref 65–99)
Potassium: 4.5 mmol/L (ref 3.5–5.3)
Sodium: 136 mmol/L (ref 135–146)

## 2016-08-17 ENCOUNTER — Telehealth: Payer: Self-pay | Admitting: Cardiology

## 2016-08-17 NOTE — Telephone Encounter (Signed)
Continue present meds and follow BP Brian Crenshaw  

## 2016-08-17 NOTE — Telephone Encounter (Signed)
Spoke with wanda, Aware of dr Jacalyn Lefevre recommendations.

## 2016-08-17 NOTE — Telephone Encounter (Signed)
Follow up    Mariann Laster is calling back about pt.

## 2016-08-17 NOTE — Telephone Encounter (Signed)
Left message for Glenda [name] to call

## 2016-08-17 NOTE — Telephone Encounter (Signed)
New message      Pt c/o BP issue: STAT if pt c/o blurred vision, one-sided weakness or slurred speech  1. What are your last 5 BP readings? 110/71 today, 97/80 last week 2. Are you having any other symptoms (ex. Dizziness, headache, blurred vision, passed out)?  Pt feels weak 3. What is your BP issue?  Pt c/o that her bp is too low.  Nurse faxed bp readings to [phone] fax.  Please advise

## 2016-08-17 NOTE — Telephone Encounter (Signed)
Spoke with Mariann Laster, nurse at [facility]. She stated that the patient's blood pressure has been sporadic since 5/23 with readings:  5/23 10:15  112/50  17:39 154/78  5/24 10:27  112/70  17:11 102/74  5/25 10:02  112/79  17:06 148/82  5/26 10:41 99/80  19:40 102/74  5/27 10:25 112/82  17:49 148/85  5/28 10:01 116/81  17:10 136/82  5/29 10:22 101/71  No heart rate was recorded. Mariann Laster stated that the patient has refused the Metoprolol the last three days. She has been asymptomatic except for overall weakness. Will route to the physician for further recommendation.

## 2016-08-19 ENCOUNTER — Telehealth: Payer: Self-pay | Admitting: Cardiology

## 2016-08-19 ENCOUNTER — Inpatient Hospital Stay (HOSPITAL_COMMUNITY)
Admission: AD | Admit: 2016-08-19 | Discharge: 2016-08-20 | DRG: 309 | Disposition: A | Discharge status: Nursing Home (Includes ICF) | Payer: Medicare Other | Source: Ambulatory Visit | Attending: Cardiovascular Disease | Admitting: Cardiovascular Disease

## 2016-08-19 ENCOUNTER — Encounter (HOSPITAL_COMMUNITY): Payer: Self-pay

## 2016-08-19 ENCOUNTER — Inpatient Hospital Stay (HOSPITAL_COMMUNITY): Payer: Medicare Other

## 2016-08-19 ENCOUNTER — Encounter: Payer: Self-pay | Admitting: Cardiology

## 2016-08-19 ENCOUNTER — Ambulatory Visit (INDEPENDENT_AMBULATORY_CARE_PROVIDER_SITE_OTHER): Payer: Medicare Other | Admitting: Cardiology

## 2016-08-19 VITALS — BP 102/70 | HR 129 | Ht 61.0 in | Wt 127.4 lb

## 2016-08-19 DIAGNOSIS — Z888 Allergy status to other drugs, medicaments and biological substances status: Secondary | ICD-10-CM

## 2016-08-19 DIAGNOSIS — Z96651 Presence of right artificial knee joint: Secondary | ICD-10-CM | POA: Diagnosis present

## 2016-08-19 DIAGNOSIS — Z7901 Long term (current) use of anticoagulants: Secondary | ICD-10-CM

## 2016-08-19 DIAGNOSIS — K219 Gastro-esophageal reflux disease without esophagitis: Secondary | ICD-10-CM | POA: Diagnosis present

## 2016-08-19 DIAGNOSIS — Z8249 Family history of ischemic heart disease and other diseases of the circulatory system: Secondary | ICD-10-CM | POA: Diagnosis not present

## 2016-08-19 DIAGNOSIS — E7800 Pure hypercholesterolemia, unspecified: Secondary | ICD-10-CM | POA: Diagnosis present

## 2016-08-19 DIAGNOSIS — I483 Typical atrial flutter: Principal | ICD-10-CM | POA: Diagnosis present

## 2016-08-19 DIAGNOSIS — I110 Hypertensive heart disease with heart failure: Secondary | ICD-10-CM | POA: Diagnosis present

## 2016-08-19 DIAGNOSIS — Z66 Do not resuscitate: Secondary | ICD-10-CM | POA: Diagnosis present

## 2016-08-19 DIAGNOSIS — I495 Sick sinus syndrome: Secondary | ICD-10-CM | POA: Diagnosis present

## 2016-08-19 DIAGNOSIS — R5383 Other fatigue: Secondary | ICD-10-CM | POA: Diagnosis present

## 2016-08-19 DIAGNOSIS — Z880 Allergy status to penicillin: Secondary | ICD-10-CM

## 2016-08-19 DIAGNOSIS — I5032 Chronic diastolic (congestive) heart failure: Secondary | ICD-10-CM | POA: Diagnosis present

## 2016-08-19 DIAGNOSIS — I4892 Unspecified atrial flutter: Secondary | ICD-10-CM | POA: Diagnosis present

## 2016-08-19 DIAGNOSIS — I350 Nonrheumatic aortic (valve) stenosis: Secondary | ICD-10-CM

## 2016-08-19 DIAGNOSIS — R000 Tachycardia, unspecified: Secondary | ICD-10-CM

## 2016-08-19 DIAGNOSIS — I080 Rheumatic disorders of both mitral and aortic valves: Secondary | ICD-10-CM | POA: Diagnosis present

## 2016-08-19 DIAGNOSIS — Z882 Allergy status to sulfonamides status: Secondary | ICD-10-CM | POA: Diagnosis not present

## 2016-08-19 DIAGNOSIS — R5381 Other malaise: Secondary | ICD-10-CM | POA: Diagnosis present

## 2016-08-19 DIAGNOSIS — R531 Weakness: Secondary | ICD-10-CM | POA: Diagnosis not present

## 2016-08-19 DIAGNOSIS — R0602 Shortness of breath: Secondary | ICD-10-CM | POA: Diagnosis present

## 2016-08-19 DIAGNOSIS — Z9889 Other specified postprocedural states: Secondary | ICD-10-CM

## 2016-08-19 DIAGNOSIS — E785 Hyperlipidemia, unspecified: Secondary | ICD-10-CM | POA: Diagnosis present

## 2016-08-19 DIAGNOSIS — I5042 Chronic combined systolic (congestive) and diastolic (congestive) heart failure: Secondary | ICD-10-CM

## 2016-08-19 DIAGNOSIS — I2510 Atherosclerotic heart disease of native coronary artery without angina pectoris: Secondary | ICD-10-CM | POA: Diagnosis present

## 2016-08-19 DIAGNOSIS — I10 Essential (primary) hypertension: Secondary | ICD-10-CM

## 2016-08-19 DIAGNOSIS — R002 Palpitations: Secondary | ICD-10-CM | POA: Diagnosis present

## 2016-08-19 HISTORY — DX: Unspecified atrial flutter: I48.92

## 2016-08-19 LAB — COMPREHENSIVE METABOLIC PANEL
ALT: 15 U/L (ref 14–54)
AST: 24 U/L (ref 15–41)
Albumin: 3.5 g/dL (ref 3.5–5.0)
Alkaline Phosphatase: 68 U/L (ref 38–126)
Anion gap: 7 (ref 5–15)
BUN: 26 mg/dL — ABNORMAL HIGH (ref 6–20)
CO2: 26 mmol/L (ref 22–32)
Calcium: 9 mg/dL (ref 8.9–10.3)
Chloride: 102 mmol/L (ref 101–111)
Creatinine, Ser: 0.94 mg/dL (ref 0.44–1.00)
GFR calc Af Amer: >60 mL/min (ref >60)
GFR calc non Af Amer: 52 mL/min — ABNORMAL LOW (ref >60)
Glucose, Bld: 135 mg/dL — ABNORMAL HIGH (ref 65–99)
Potassium: 4.3 mmol/L (ref 3.5–5.1)
Sodium: 135 mmol/L (ref 135–145)
Total Bilirubin: 0.4 mg/dL (ref 0.3–1.2)
Total Protein: 6.6 g/dL (ref 6.5–8.1)

## 2016-08-19 LAB — PROTIME-INR
INR: 1.15
Prothrombin Time: 14.8 seconds (ref 11.4–15.2)

## 2016-08-19 LAB — CBC WITH DIFFERENTIAL/PLATELET
Basophils Absolute: 0 10*3/uL (ref 0.0–0.1)
Basophils Relative: 1 %
Eosinophils Absolute: 0.2 10*3/uL (ref 0.0–0.7)
Eosinophils Relative: 4 %
HCT: 39.1 % (ref 36.0–46.0)
Hemoglobin: 13 g/dL (ref 12.0–15.0)
Lymphocytes Relative: 27 %
Lymphs Abs: 1.5 10*3/uL (ref 0.7–4.0)
MCH: 30.7 pg (ref 26.0–34.0)
MCHC: 33.2 g/dL (ref 30.0–36.0)
MCV: 92.2 fL (ref 78.0–100.0)
Monocytes Absolute: 0.4 10*3/uL (ref 0.1–1.0)
Monocytes Relative: 7 %
Neutro Abs: 3.5 10*3/uL (ref 1.7–7.7)
Neutrophils Relative %: 61 %
Platelets: 195 10*3/uL (ref 150–400)
RBC: 4.24 MIL/uL (ref 3.87–5.11)
RDW: 14.2 % (ref 11.5–15.5)
WBC: 5.6 10*3/uL (ref 4.0–10.5)

## 2016-08-19 LAB — TROPONIN I
Troponin I: 0.05 ng/mL (ref <0.03)
Troponin I: 0.05 ng/mL (ref <0.03)

## 2016-08-19 LAB — BRAIN NATRIURETIC PEPTIDE: B Natriuretic Peptide: 479.4 pg/mL — ABNORMAL HIGH (ref 0.0–100.0)

## 2016-08-19 LAB — MAGNESIUM: Magnesium: 2.2 mg/dL (ref 1.7–2.4)

## 2016-08-19 LAB — TSH: TSH: 1.824 u[IU]/mL (ref 0.350–4.500)

## 2016-08-19 LAB — T4, FREE: Free T4: 0.93 ng/dL (ref 0.61–1.12)

## 2016-08-19 MED ORDER — METOPROLOL TARTRATE 12.5 MG HALF TABLET
12.5000 mg | ORAL_TABLET | Freq: Every day | ORAL | Status: DC
  Administered 2016-08-20: 12.5 mg via ORAL
  Filled 2016-08-19: qty 1

## 2016-08-19 MED ORDER — SODIUM CHLORIDE 0.9 % IV SOLN
INTRAVENOUS | Status: DC
  Administered 2016-08-19 – 2016-08-20 (×2): via INTRAVENOUS
  Administered 2016-08-20: 250 mL via INTRAVENOUS

## 2016-08-19 MED ORDER — ALBUTEROL SULFATE (2.5 MG/3ML) 0.083% IN NEBU: 2.5000 mg | INHALATION_SOLUTION | Freq: Four times a day (QID) | RESPIRATORY_TRACT | Status: DC | PRN

## 2016-08-19 MED ORDER — ONDANSETRON HCL 4 MG PO TABS
4.0000 mg | ORAL_TABLET | Freq: Three times a day (TID) | ORAL | Status: DC | PRN
  Administered 2016-08-20: 4 mg via ORAL
  Filled 2016-08-19: qty 1

## 2016-08-19 MED ORDER — DEXTROSE 5 % IV SOLN
5.0000 mg/h | INTRAVENOUS | Status: DC
  Administered 2016-08-19 – 2016-08-20 (×2): 5 mg/h via INTRAVENOUS
  Filled 2016-08-19 (×2): qty 100

## 2016-08-19 MED ORDER — PANTOPRAZOLE SODIUM 40 MG PO TBEC
40.0000 mg | DELAYED_RELEASE_TABLET | Freq: Every day | ORAL | Status: DC
  Administered 2016-08-20: 40 mg via ORAL
  Filled 2016-08-19: qty 1

## 2016-08-19 MED ORDER — HYDROCODONE-ACETAMINOPHEN 7.5-325 MG PO TABS: 1.0000 | ORAL_TABLET | Freq: Every day | ORAL | Status: DC | PRN

## 2016-08-19 MED ORDER — TEMAZEPAM 15 MG PO CAPS
15.0000 mg | ORAL_CAPSULE | Freq: Every evening | ORAL | Status: DC | PRN
  Administered 2016-08-19: 15 mg via ORAL
  Filled 2016-08-19: qty 1

## 2016-08-19 MED ORDER — ACETAMINOPHEN 325 MG PO TABS: 650.0000 mg | ORAL_TABLET | ORAL | Status: DC | PRN

## 2016-08-19 MED ORDER — APIXABAN 2.5 MG PO TABS: 2.5000 mg | ORAL_TABLET | Freq: Two times a day (BID) | ORAL | Status: DC

## 2016-08-19 MED ORDER — VITAMIN D 1000 UNITS PO TABS
1000.0000 [IU] | ORAL_TABLET | Freq: Every day | ORAL | Status: DC
  Administered 2016-08-20: 1000 [IU] via ORAL
  Filled 2016-08-19: qty 1

## 2016-08-19 MED ORDER — FUROSEMIDE 20 MG PO TABS
20.0000 mg | ORAL_TABLET | ORAL | Status: DC
  Administered 2016-08-20: 20 mg via ORAL
  Filled 2016-08-19: qty 1

## 2016-08-19 MED ORDER — VITAMIN D 1000 UNITS PO CAPS: 1000.0000 [IU] | ORAL_CAPSULE | Freq: Every day | ORAL | Status: DC

## 2016-08-19 MED ORDER — ONDANSETRON HCL 4 MG/2ML IJ SOLN: 4.0000 mg | Freq: Four times a day (QID) | INTRAMUSCULAR | Status: DC | PRN

## 2016-08-19 MED ORDER — APIXABAN 2.5 MG PO TABS
2.5000 mg | ORAL_TABLET | Freq: Two times a day (BID) | ORAL | Status: DC
  Administered 2016-08-19 – 2016-08-20 (×2): 2.5 mg via ORAL
  Filled 2016-08-19 (×2): qty 1

## 2016-08-19 MED ORDER — ALBUTEROL SULFATE HFA 108 (90 BASE) MCG/ACT IN AERS: 2.0000 | INHALATION_SPRAY | Freq: Four times a day (QID) | RESPIRATORY_TRACT | Status: DC | PRN

## 2016-08-19 NOTE — Telephone Encounter (Signed)
Spoke with Glenda [name], patients bp this morning after taking medications is currently 95/68 and her pulse is 130. They report her pulse has been 130 for the last 2 days. She is currently taking amlodipine 5 mg once daily in the am, furosemide 20 mg in the am and metoprolol 12.5 mg once daily in the am. She takes losartan 50 mg at bedtime. They would like to know what parameters they can use regarding when to give the bp medications or not. Will forward for dr Stanford Breed review

## 2016-08-19 NOTE — Telephone Encounter (Signed)
Paov; may be in recurrent atrial flutter Kirk Ruths

## 2016-08-19 NOTE — Progress Notes (Signed)
Cardiology Office Note   Date:  08/19/2016   ID:  Glenda [name], DOB 01-23-1925, MRN [id number]  PCP:  Dorothyann Peng, NP  Cardiologist:  Stanford Breed     Chief Complaint  Patient presents with  . Tachycardia      History of Present Illness: Glenda [name] is a 83 y.o. female who presents for recurrent atrial flutter.    Last seen by Dr. Stanford Breed 08/06/16  She has a hx of hypertension and aortic stenosis. History of atypical chest pain and palpitations. She had a exercise treadmill test on 01/26/11 negative for ischemia. 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  Admitted July 2017 with atrial flutter vs atrial tachycardia and underwent TEE guided cardioversion. Echo showed ejection fraction 40-45% at that time Echo 12/2015 with EF 55-60%,  Mild regurg, of aortic valve, mild regurg of Mitral valve.    Cardiac cath 2002  IMPRESSION: 1. Mild to moderate coronary atherosclerotic heart disease, predominately    involving the LAD with calcification proximally and luminal irregularities,    and mild ostial right coronary artery disease. 2. Significant systolic bridging seen in the mid portion of the left anterior    descending coronary. 3. Normal left ventricular function.  Today she had called with HR at 130 and BP 95/68.  Currently in a flutter with RVR at 129.  She has not felt well for several days, kept thinking she would feel better.  No chest pain and no SOB.  Her most recent EKG with SB at 48.  Only on lopressor 12.5 mg BID.  She is weak and SOB especially with activity.  No chest pain.  She has not missed any Eliquis.     Past Medical History:  Diagnosis Date  . Aortic stenosis    Echo 02/2011 showing mild AS with normal LV systolic function  . Arthritis    "lower back" (10/09/2015)  . Chronic bronchitis ([place])    "off and on; several years" (10/09/2015)  . Chronic diastolic CHF (congestive heart failure) ([place])    a. 12/2015 Echo: EF 55-60%, mild AI/MR, mildly dil LA.  Marland Kitchen Chronic lower  back pain   . Complication of anesthesia    "they had trouble waking me up after colon resection"  . DDD (degenerative disc disease), lumbar    severe facet dz and adv DDD MRI L spine 2009  . Diverticulosis   . GERD (gastroesophageal reflux disease)   . Hiatal hernia    hx  . Hypercholesterolemia   . Hypertension   . Insomnia   . OA (osteoarthritis) of knee   . Paroxysmal atrial flutter ([place])    a. 09/2015 s/p TEE/DCCV;  b. CHA2DS2VASc = 5-->Eliquis.  . Peripheral edema   . PVCs (premature ventricular contractions)   . Sacral fracture ([place]) 11/06/2012  . TIA (transient ischemic attack)    pt denies this hx on 10/09/2015 "it was the [place] I was taking; they had thought I was having a stroke"    Past Surgical History:  Procedure Laterality Date  . BREAST BIOPSY Left   . CARDIAC CATHETERIZATION  02/17/2001   MILD REGURGITATION. EF 60%  . CARDIOVERSION N/A 10/08/2015   Procedure: CARDIOVERSION;  Surgeon: Sanda Klein, MD;  Location: [facility] END[facility]COPY;  Service: Cardiovascular;  Laterality: N/A;  . CATARACT EXTRACTION W/ INTRAOCULAR LENS  IMPLANT, BILATERAL Bilateral   . [place]  . COLECTOMY    . COLONOSCOPY    . EXCISIONAL HEMORRHOIDECTOMY    . INGUINAL HERNIA  REPAIR Bilateral   . KYPHOPLASTY Right 09/11/2012   Procedure: Right Acrylic Sacroplasty;  Surgeon: Kristeen Miss, MD;  Location: [facility] NEURO ORS;  Service: Neurosurgery;  Laterality: Right;  Right  Acrylic Sacroplasty  . TEE WITHOUT CARDIOVERSION N/A 10/08/2015   Procedure: TRANSESOPHAGEAL ECHOCARDIOGRAM (TEE);  Surgeon: Sanda Klein, MD;  Location: [facility] ENDOSCOPY;  Service: Cardiovascular;  Laterality: N/A;  . TOTAL KNEE ARTHROPLASTY Right   . UMBILICAL HERNIA REPAIR       Current Outpatient Prescriptions  Medication Sig Dispense Refill  . albuterol (PROAIR HFA) 108 (90 Base) MCG/ACT inhaler Inhale 2 puffs into the lungs every 6 (six) hours as needed for shortness of breath.    Marland Kitchen amLODipine (NORVASC) 5 MG tablet  Take 5 mg by mouth daily.    Marland Kitchen apixaban (ELIQUIS) 2.5 MG TABS tablet Take 1 tablet (2.5 mg total) by mouth 2 (two) times daily. 60 tablet 4  . Cholecalciferol (VITAMIN D) 1000 UNITS capsule Take 1,000 Units by mouth daily.     . furosemide (LASIX) 20 MG tablet Take 20 mg by mouth every other day.     Marland Kitchen HYDROcodone-acetaminophen (NORCO) 7.5-325 MG tablet Take 7.5-325 tablets by mouth 3 (three) times daily.    Marland Kitchen losartan (COZAAR) 50 MG tablet Take 1 tablet (50 mg total) by mouth daily. 30 tablet 11  . metoprolol tartrate (LOPRESSOR) 25 MG tablet Take 0.5 tablets (12.5 mg total) by mouth daily. 30 tablet 6  . omeprazole (PRILOSEC) 20 MG capsule Take 20 mg by mouth daily.    . ondansetron (ZOFRAN) 4 MG tablet Take 4 mg by mouth every 8 (eight) hours as needed for nausea or vomiting.    Marland Kitchen OVER THE COUNTER MEDICATION Take 20 mLs by mouth 3 (three) times daily as needed (cough). QC Tussin CF PE    . temazepam (RESTORIL) 15 MG capsule Take 15 mg by mouth at bedtime as needed.      No current facility-administered medications for this visit.     Allergies:   Ace inhibitors; Amitriptyline; Avelox [moxifloxacin hcl in nacl]; Halcion [triazolam]; Pentazocine lactate; Sulfa drugs cross reactors; Trazodone and nefazodone; and Penicillins    Social History:  The patient  reports that she has never smoked. She has never used smokeless tobacco. She reports that she does not drink alcohol or use drugs.   Family History:  The patient's family history includes Heart disease in her mother and sister; Hypertension in her father and sister.    ROS:  General:no colds or fevers, no weight changes Skin:no rashes or ulcers HEENT:no blurred vision, no congestion CV:see HPI PUL:see HPI GI:no diarrhea constipation or melena, no indigestion GU:no hematuria, no dysuria MS:no joint pain, no claudication Neuro:no syncope, no lightheadedness Endo:no diabetes, no thyroid disease Wt Readings from Last 3 Encounters:    08/19/16 127 lb 6.4 oz (57.8 kg)  08/02/16 123 lb (55.8 kg)  03/29/16 123 lb (55.8 kg)     PHYSICAL EXAM: VS:  BP 102/70   Pulse (!) 129   Ht 5\' 1"  (1.549 m)   Wt 127 lb 6.4 oz (57.8 kg)   SpO2 97%   BMI 24.07 kg/m  , BMI Body mass index is 24.07 kg/m. General:Pleasant affect, NAD Skin:Warm and dry, brisk capillary refill HEENT:normocephalic, sclera clear, mucus membranes moist Neck:supple, no JVD, no bruits  Heart: rapid regular without murmur, gallup, rub or click Lungs: with rales bibasilar,  No rhonchi, or wheezes CZY:SAYT, non tender, + BS, do not palpate liver spleen or masses  Ext:no lower ext edema, 2+ pedal pulses, 2+ radial pulses Neuro:alert and oriented X 3, MAE, follows commands, + facial symmetry    EKG:  EKG is ordered today. The ekg ordered today demonstrates a flutter with RVR 2:1 flutter.  Poor R wave progression.  HR 129 Previous EKG on the 15th with sinus brady at 48    Recent Labs: 10/07/2015: ALT 58 10/09/2015: B Natriuretic Peptide 199.0 10/10/2015: Magnesium 2.0 11/10/2015: TSH 0.92 08/02/2016: BUN 33; Creat 1.28; Hemoglobin 13.0; Platelets 206; Potassium 4.5; Sodium 136    Lipid Panel    Component Value Date/Time   CHOL 165 05/01/2015 1059   TRIG 59 05/01/2015 1059   HDL 85 05/01/2015 1059   CHOLHDL 1.9 05/01/2015 1059   VLDL 12 05/01/2015 1059   LDLCALC 68 05/01/2015 1059       Other studies Reviewed: Additional studies/ records that were reviewed today include: .  ECHO 12/2015 Study Conclusions  - Left ventricle: The cavity size was normal. There was mild   concentric hypertrophy. Systolic function was normal. The   estimated ejection fraction was in the range of 55% to 60%. - Aortic valve: There was mild regurgitation. - Mitral valve: There was mild regurgitation. - Left atrium: The atrium was mildly dilated. - Atrial septum: No defect or patent foramen ovale was identified.  TEE 10/08/15 Study Conclusions  - Left  ventricle: The cavity size was normal. Wall thickness was   normal. Systolic function was moderately to severely reduced. The   estimated ejection fraction was in the range of 30% to 35%.   Moderate diffuse hypokinesis with no identifiable regional   variations. - Aortic valve: No evidence of vegetation. There was mild   regurgitation. - Mitral valve: There was moderate regurgitation directed   centrally. - Left atrium: The atrium was mildly dilated. No evidence of   thrombus in the atrial cavity or appendage. No evidence of   thrombus in the atrial cavity or appendage. No spontaneous echo   contrast was observed. The appendage was multilobulated and   dilated. Emptying velocity was normal. - Right atrium: No evidence of thrombus in the atrial cavity or   appendage. No evidence of thrombus in the atrial cavity or   appendage. No spontaneous echo contrast was observed. - Atrial septum: No defect or patent foramen ovale was identified. - Tricuspid valve: No evidence of vegetation. No evidence of   vegetation. - Pulmonic valve: No evidence of vegetation. - Pericardium, extracardiac: A trivial pericardial effusion was   identified.  Impressions:  - Review of atrial appendage Doppler flow demonstrates that the   rhythm is an atrial tachycardia with 1:1 conduction.Following   cardioversion, the rhythm was marked sinus bradycardia with   frequent PACs.  ASSESSMENT AND PLAN:  1.  A flutter with RVR at 128  - admit to [facility] and begin IV dilt, continue Eliquis and low dose BB.  Make NPO after MN for possible DCCV in AM  Vs. EP consult for medication.  Pt agreeable.  Check labs and CXR   2. Tachy brady syndrome SB on 12.5 of lopressor BID when not in a flutter.  3. HTN BP lower today with a flutter  4. Hx mild AS  5.  Chronic diastolic HF.  Check cxr and BNP   Current medicines are reviewed with the patient today.  The patient Has no concerns regarding medicines.  The following  changes have been made:  See above Labs/ tests ordered today include:see above  Disposition:   FU:  see above  Signed, Cecilie Kicks, NP  08/19/2016 3:32 PM    [facility] [place], [place], [address] [place], [place] Phone: [phone]; Fax: [phone]

## 2016-08-19 NOTE — Patient Instructions (Signed)
Please go to the Main Entrance at [facility]).  Go to admitting.  There is Valet parking if needed.

## 2016-08-19 NOTE — Telephone Encounter (Signed)
New message    Pt c/o BP issue: STAT if pt c/o blurred vision, one-sided weakness or slurred speech  1. What are your last 5 BP readings?just now 95/68 p-130, 114/79 p-130, 101/71,116/81  2. Are you having any other symptoms (ex. Dizziness, headache, blurred vision, passed out)? Weakness  3. What is your BP issue? Blood pressure has been running low in the morning and pulse is high

## 2016-08-19 NOTE — Telephone Encounter (Signed)
Spoke with teresa, Follow up scheduled today at the [address] location with laura ingold np.

## 2016-08-19 NOTE — Progress Notes (Signed)
08/19/2016 1630 Received direct admit from Dr. Kyla Balzarine office, admitting with A-Flutter.  Pt is A&O, no c/o voiced.  Tele monitor applied and CCMD notified.  Oriented to room, call light and bed.  Call bell in reach, family at bedside. [place]

## 2016-08-19 NOTE — H&P (Signed)
HISTORY AND PHYSICAL   Date:  08/19/2016   ID:  Glenda [name], DOB July 20, 1924, MRN [id number]  PCP:  Dorothyann Peng, NP        Cardiologist:  Stanford Breed        Chief Complaint  Patient presents with  . Tachycardia      History of Present Illness: Glenda [name] is a 83 y.o. female who presents for recurrent atrial flutter.    Last seen by Dr. Stanford Breed 08/06/16  She has a hx of hypertension and aortic stenosis. History of atypical chest pain and palpitations. She had a exercise treadmill test on 01/26/11 negative for ischemia. 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  Admitted July 2017 with atrial flutter vs atrial tachycardia and underwent TEE guided cardioversion. Echo showed ejection fraction 40-45% at that time Echo 12/2015 with EF 55-60%,  Mild regurg, of aortic valve, mild regurg of Mitral valve.    Cardiac cath 2002  IMPRESSION: 1. Mild to moderate coronary atherosclerotic heart disease, predominately involving the LAD with calcification proximally and luminal irregularities, and mild ostial right coronary artery disease. 2. 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 3. Normal left ventricular function.  Today she had called with HR at 130 and BP 95/68.  Currently in a flutter with RVR at 129.  She has not felt well for several days, kept thinking she would feel better.  No chest pain and no SOB.  Her most recent EKG with SB at 48.  Only on lopressor 12.5 mg BID.  She is weak and SOB especially with activity.  No chest pain.  She has not missed any Eliquis.         Past Medical History:  Diagnosis Date  . Aortic stenosis    Echo 02/2011 showing mild AS with normal LV systolic function  . Arthritis    "lower back" (10/09/2015)  . Chronic bronchitis ([place])    "off and on; several years" (10/09/2015)  . Chronic diastolic CHF (congestive heart failure) ([place])    a. 12/2015 Echo: EF 55-60%, mild AI/MR,  mildly dil LA.  Marland Kitchen Chronic lower back pain   . Complication of anesthesia    "they had trouble waking me up after colon resection"  . DDD (degenerative disc disease), lumbar    severe facet dz and adv DDD MRI L spine 2009  . Diverticulosis   . GERD (gastroesophageal reflux disease)   . Hiatal hernia    hx  . Hypercholesterolemia   . Hypertension   . Insomnia   . OA (osteoarthritis) of knee   . Paroxysmal atrial flutter ([place])    a. 09/2015 s/p TEE/DCCV;  b. CHA2DS2VASc = 5-->Eliquis.  . Peripheral edema   . PVCs (premature ventricular contractions)   . Sacral fracture ([place]) 11/06/2012  . TIA (transient ischemic attack)    pt denies this hx on 10/09/2015 "it was the [place] I was taking; they had thought I was having a stroke"         Past Surgical History:  Procedure Laterality Date  . BREAST BIOPSY Left   . CARDIAC CATHETERIZATION  02/17/2001   MILD REGURGITATION. EF 60%  . CARDIOVERSION N/A 10/08/2015   Procedure: CARDIOVERSION;  Surgeon: Sanda Klein, MD;  Location: [facility] ENDOSCOPY;  Service: Cardiovascular;  Laterality: N/A;  . CATARACT EXTRACTION W/ INTRAOCULAR LENS  IMPLANT, BILATERAL Bilateral   . [place]  . COLECTOMY    . COLONOSCOPY    .  EXCISIONAL HEMORRHOIDECTOMY    . INGUINAL HERNIA REPAIR Bilateral   . KYPHOPLASTY Right 09/11/2012   Procedure: Right Acrylic Sacroplasty;  Surgeon: Kristeen Miss, MD;  Location: [facility] NEURO ORS;  Service: Neurosurgery;  Laterality: Right;  Right  Acrylic Sacroplasty  . TEE WITHOUT CARDIOVERSION N/A 10/08/2015   Procedure: TRANSESOPHAGEAL ECHOCARDIOGRAM (TEE);  Surgeon: Sanda Klein, MD;  Location: [facility] ENDOSCOPY;  Service: Cardiovascular;  Laterality: N/A;  . TOTAL KNEE ARTHROPLASTY Right   . UMBILICAL HERNIA REPAIR             Current Outpatient Prescriptions  Medication Sig Dispense Refill  . albuterol (PROAIR HFA) 108 (90 Base) MCG/ACT inhaler Inhale 2 puffs into the lungs every  6 (six) hours as needed for shortness of breath.    Marland Kitchen amLODipine (NORVASC) 5 MG tablet Take 5 mg by mouth daily.    Marland Kitchen apixaban (ELIQUIS) 2.5 MG TABS tablet Take 1 tablet (2.5 mg total) by mouth 2 (two) times daily. 60 tablet 4  . Cholecalciferol (VITAMIN D) 1000 UNITS capsule Take 1,000 Units by mouth daily.     . furosemide (LASIX) 20 MG tablet Take 20 mg by mouth every other day.     Marland Kitchen HYDROcodone-acetaminophen (NORCO) 7.5-325 MG tablet Take 7.5-325 tablets by mouth 3 (three) times daily.    Marland Kitchen losartan (COZAAR) 50 MG tablet Take 1 tablet (50 mg total) by mouth daily. 30 tablet 11  . metoprolol tartrate (LOPRESSOR) 25 MG tablet Take 0.5 tablets (12.5 mg total) by mouth daily. 30 tablet 6  . omeprazole (PRILOSEC) 20 MG capsule Take 20 mg by mouth daily.    . ondansetron (ZOFRAN) 4 MG tablet Take 4 mg by mouth every 8 (eight) hours as needed for nausea or vomiting.    Marland Kitchen OVER THE COUNTER MEDICATION Take 20 mLs by mouth 3 (three) times daily as needed (cough). QC Tussin CF PE    . temazepam (RESTORIL) 15 MG capsule Take 15 mg by mouth at bedtime as needed.      No current facility-administered medications for this visit.     Allergies:   Ace inhibitors; Amitriptyline; Avelox [moxifloxacin hcl in nacl]; Halcion [triazolam]; Pentazocine lactate; Sulfa drugs cross reactors; Trazodone and nefazodone; and Penicillins    Social History:  The patient  reports that she has never smoked. She has never used smokeless tobacco. She reports that she does not drink alcohol or use drugs.   Family History:  The patient's family history includes Heart disease in her mother and sister; Hypertension in her father and sister.    ROS:  General:no colds or fevers, no weight changes Skin:no rashes or ulcers HEENT:no blurred vision, no congestion CV:see HPI PUL:see HPI GI:no diarrhea constipation or melena, no indigestion GU:no hematuria, no dysuria MS:no joint pain, no  claudication Neuro:no syncope, no lightheadedness Endo:no diabetes, no thyroid disease    Wt Readings from Last 3 Encounters:  08/19/16 127 lb 6.4 oz (57.8 kg)  08/02/16 123 lb (55.8 kg)  03/29/16 123 lb (55.8 kg)     PHYSICAL EXAM: VS:  BP 102/70   Pulse (!) 129   Ht 5\' 1"  (1.549 m)   Wt 127 lb 6.4 oz (57.8 kg)   SpO2 97%   BMI 24.07 kg/m  , BMI Body mass index is 24.07 kg/m. General:Pleasant affect, NAD Skin:Warm and dry, brisk capillary refill HEENT:normocephalic, sclera clear, mucus membranes moist Neck:supple, no JVD, no bruits  Heart: rapid regular without murmur, gallup, rub or click Lungs: with rales bibasilar,  No rhonchi, or wheezes XBM:WUXL, non tender, + BS, do not palpate liver spleen or masses Ext:no lower ext edema, 2+ pedal pulses, 2+ radial pulses Neuro:alert and oriented X 3, MAE, follows commands, + facial symmetry    EKG:  EKG is ordered today. The ekg ordered today demonstrates a flutter with RVR 2:1 flutter.  Poor R wave progression.  HR 129 Previous EKG on the 15th with sinus brady at 48    Recent Labs: 10/07/2015: ALT 58 10/09/2015: B Natriuretic Peptide 199.0 10/10/2015: Magnesium 2.0 11/10/2015: TSH 0.92 08/02/2016: BUN 33; Creat 1.28; Hemoglobin 13.0; Platelets 206; Potassium 4.5; Sodium 136    Lipid Panel Labs (Brief)          Component Value Date/Time   CHOL 165 05/01/2015 1059   TRIG 59 05/01/2015 1059   HDL 85 05/01/2015 1059   CHOLHDL 1.9 05/01/2015 1059   VLDL 12 05/01/2015 1059   LDLCALC 68 05/01/2015 1059         Other studies Reviewed: Additional studies/ records that were reviewed today include: .  ECHO 12/2015 Study Conclusions  - Left ventricle: The cavity size was normal. There was mild concentric hypertrophy. Systolic function was normal. The estimated ejection fraction was in the range of 55% to 60%. - Aortic valve: There was mild regurgitation. - Mitral valve: There was mild  regurgitation. - Left atrium: The atrium was mildly dilated. - Atrial septum: No defect or patent foramen ovale was identified.  TEE 10/08/15 Study Conclusions  - Left ventricle: The cavity size was normal. Wall thickness was normal. Systolic function was moderately to severely reduced. The estimated ejection fraction was in the range of 30% to 35%. Moderate diffuse hypokinesis with no identifiable regional variations. - Aortic valve: No evidence of vegetation. There was mild regurgitation. - Mitral valve: There was moderate regurgitation directed centrally. - Left atrium: The atrium was mildly dilated. No evidence of thrombus in the atrial cavity or appendage. No evidence of thrombus in the atrial cavity or appendage. No spontaneous echo contrast was observed. The appendage was multilobulated and dilated. Emptying velocity was normal. - Right atrium: No evidence of thrombus in the atrial cavity or appendage. No evidence of thrombus in the atrial cavity or appendage. No spontaneous echo contrast was observed. - Atrial septum: No defect or patent foramen ovale was identified. - Tricuspid valve: No evidence of vegetation. No evidence of vegetation. - Pulmonic valve: No evidence of vegetation. - Pericardium, extracardiac: A trivial pericardial effusion was identified.  Impressions:  - Review of atrial appendage Doppler flow demonstrates that the rhythm is an atrial tachycardia with 1:1 conduction.Following cardioversion, the rhythm was marked sinus bradycardia with frequent PACs.  ASSESSMENT AND PLAN:  1.  A flutter with RVR at 128  - admit to [facility] and begin IV dilt, continue Eliquis and low dose BB.  Make NPO after MN for possible DCCV in AM  Vs. EP consult for medication.  Pt agreeable.  Check labs and CXR  CHA2DS2VASc at least 5.    2. Tachy brady syndrome SB on 12.5 of lopressor BID when not in a flutter.  3. HTN BP lower today  with a flutter  4. Hx mild AS  5.  Chronic diastolic HF.  Check cxr and BNP    Pt is a Full Code  Though she does not want prolonged life saving measures.    Current medicines are reviewed with the patient today.  The patient Has no concerns regarding medicines.  The following changes  have been made:  See above Labs/ tests ordered today include:see above  Disposition:   FU:  see above  Signed, Cecilie Kicks, NP  08/19/2016 3:32 PM    [facility] [place], [place], [place], [place] Phone: [phone]; Fax: [phone]  Patient examined chart reviewed Seen in office with PA Rapid atrial flutter. BP soft. Given age will admit to hospital On Rx DOAC with no missed doses. Should have [facility] latter Today or in am if she has not converted. EP to see regarding further Drug Rx. Exam with elderly female. AS murmur clear lungs murmur Referred to neck.    Jenkins Rouge

## 2016-08-20 ENCOUNTER — Encounter (HOSPITAL_COMMUNITY)
Admission: AD | Disposition: A | Discharge status: Nursing Home (Includes ICF) | Payer: Self-pay | Source: Ambulatory Visit | Attending: Cardiovascular Disease

## 2016-08-20 ENCOUNTER — Encounter (HOSPITAL_COMMUNITY): Payer: Self-pay | Admitting: Internal Medicine

## 2016-08-20 DIAGNOSIS — Z7901 Long term (current) use of anticoagulants: Secondary | ICD-10-CM | POA: Diagnosis not present

## 2016-08-20 DIAGNOSIS — I080 Rheumatic disorders of both mitral and aortic valves: Secondary | ICD-10-CM | POA: Diagnosis present

## 2016-08-20 DIAGNOSIS — R002 Palpitations: Secondary | ICD-10-CM | POA: Diagnosis present

## 2016-08-20 DIAGNOSIS — I110 Hypertensive heart disease with heart failure: Secondary | ICD-10-CM | POA: Diagnosis present

## 2016-08-20 DIAGNOSIS — I483 Typical atrial flutter: Secondary | ICD-10-CM | POA: Diagnosis present

## 2016-08-20 DIAGNOSIS — Z880 Allergy status to penicillin: Secondary | ICD-10-CM | POA: Diagnosis not present

## 2016-08-20 DIAGNOSIS — I2510 Atherosclerotic heart disease of native coronary artery without angina pectoris: Secondary | ICD-10-CM | POA: Diagnosis present

## 2016-08-20 DIAGNOSIS — Z9889 Other specified postprocedural states: Secondary | ICD-10-CM

## 2016-08-20 DIAGNOSIS — Z96651 Presence of right artificial knee joint: Secondary | ICD-10-CM | POA: Diagnosis present

## 2016-08-20 DIAGNOSIS — Z66 Do not resuscitate: Secondary | ICD-10-CM | POA: Diagnosis present

## 2016-08-20 DIAGNOSIS — Z882 Allergy status to sulfonamides status: Secondary | ICD-10-CM | POA: Diagnosis not present

## 2016-08-20 DIAGNOSIS — E785 Hyperlipidemia, unspecified: Secondary | ICD-10-CM | POA: Diagnosis present

## 2016-08-20 DIAGNOSIS — I495 Sick sinus syndrome: Secondary | ICD-10-CM | POA: Diagnosis present

## 2016-08-20 DIAGNOSIS — Z888 Allergy status to other drugs, medicaments and biological substances status: Secondary | ICD-10-CM | POA: Diagnosis not present

## 2016-08-20 DIAGNOSIS — Z8249 Family history of ischemic heart disease and other diseases of the circulatory system: Secondary | ICD-10-CM | POA: Diagnosis not present

## 2016-08-20 DIAGNOSIS — Z9289 Personal history of other medical treatment: Secondary | ICD-10-CM

## 2016-08-20 DIAGNOSIS — K219 Gastro-esophageal reflux disease without esophagitis: Secondary | ICD-10-CM | POA: Diagnosis present

## 2016-08-20 DIAGNOSIS — I4892 Unspecified atrial flutter: Secondary | ICD-10-CM | POA: Diagnosis not present

## 2016-08-20 DIAGNOSIS — I5032 Chronic diastolic (congestive) heart failure: Secondary | ICD-10-CM | POA: Diagnosis present

## 2016-08-20 DIAGNOSIS — E7800 Pure hypercholesterolemia, unspecified: Secondary | ICD-10-CM | POA: Diagnosis present

## 2016-08-20 HISTORY — PX: CARDIOVERSION: EP1203

## 2016-08-20 LAB — COMPREHENSIVE METABOLIC PANEL
ALT: 15 U/L (ref 14–54)
AST: 25 U/L (ref 15–41)
Albumin: 4.3 g/dL (ref 3.5–5.0)
Alkaline Phosphatase: 71 U/L (ref 38–126)
Anion gap: 9 (ref 5–15)
BUN: 28 mg/dL — ABNORMAL HIGH (ref 6–20)
CO2: 27 mmol/L (ref 22–32)
Calcium: 9.1 mg/dL (ref 8.9–10.3)
Chloride: 97 mmol/L — ABNORMAL LOW (ref 101–111)
Creatinine, Ser: 1.11 mg/dL — ABNORMAL HIGH (ref 0.44–1.00)
GFR calc Af Amer: 50 mL/min — ABNORMAL LOW (ref >60)
GFR calc non Af Amer: 43 mL/min — ABNORMAL LOW (ref >60)
Glucose, Bld: 100 mg/dL — ABNORMAL HIGH (ref 65–99)
Potassium: 4.3 mmol/L (ref 3.5–5.1)
Sodium: 133 mmol/L — ABNORMAL LOW (ref 135–145)
Total Bilirubin: 0.5 mg/dL (ref 0.3–1.2)
Total Protein: 7.4 g/dL (ref 6.5–8.1)

## 2016-08-20 LAB — BASIC METABOLIC PANEL
Anion gap: 7 (ref 5–15)
BUN: 23 mg/dL — ABNORMAL HIGH (ref 6–20)
CO2: 26 mmol/L (ref 22–32)
Calcium: 8.6 mg/dL — ABNORMAL LOW (ref 8.9–10.3)
Chloride: 102 mmol/L (ref 101–111)
Creatinine, Ser: 0.83 mg/dL (ref 0.44–1.00)
GFR calc Af Amer: >60 mL/min (ref >60)
GFR calc non Af Amer: >60 mL/min (ref >60)
Glucose, Bld: 100 mg/dL — ABNORMAL HIGH (ref 65–99)
Potassium: 4.1 mmol/L (ref 3.5–5.1)
Sodium: 135 mmol/L (ref 135–145)

## 2016-08-20 LAB — HEMOGLOBIN A1C
Hgb A1c MFr Bld: 5.7 % — ABNORMAL HIGH (ref 4.8–5.6)
Mean Plasma Glucose: 117 mg/dL

## 2016-08-20 LAB — CBC
HCT: 35.4 % — ABNORMAL LOW (ref 36.0–46.0)
Hemoglobin: 11.2 g/dL — ABNORMAL LOW (ref 12.0–15.0)
MCH: 28.7 pg (ref 26.0–34.0)
MCHC: 31.6 g/dL (ref 30.0–36.0)
MCV: 90.8 fL (ref 78.0–100.0)
Platelets: 175 10*3/uL (ref 150–400)
RBC: 3.9 MIL/uL (ref 3.87–5.11)
RDW: 13.8 % (ref 11.5–15.5)
WBC: 4.5 10*3/uL (ref 4.0–10.5)

## 2016-08-20 LAB — MRSA PCR SCREENING: MRSA by PCR: NEGATIVE

## 2016-08-20 LAB — LIPID PANEL
Cholesterol: 182 mg/dL (ref 0–200)
HDL: 73 mg/dL (ref >40)
LDL Cholesterol: 100 mg/dL — ABNORMAL HIGH (ref 0–99)
Total CHOL/HDL Ratio: 2.5 RATIO
Triglycerides: 47 mg/dL (ref <150)
VLDL: 9 mg/dL (ref 0–40)

## 2016-08-20 SURGERY — CARDIOVERSION (CATH LAB)
Anesthesia: LOCAL

## 2016-08-20 MED ORDER — MIDAZOLAM HCL 2 MG/2ML IJ SOLN
INTRAMUSCULAR | Status: AC
  Filled 2016-08-20: qty 2

## 2016-08-20 MED ORDER — SODIUM CHLORIDE 0.9% FLUSH: 3.0000 mL | INTRAVENOUS | Status: DC | PRN

## 2016-08-20 MED ORDER — FENTANYL CITRATE (PF) 100 MCG/2ML IJ SOLN
INTRAMUSCULAR | Status: AC
  Filled 2016-08-20: qty 2

## 2016-08-20 MED ORDER — ONDANSETRON HCL 4 MG/2ML IJ SOLN: 4.0000 mg | Freq: Four times a day (QID) | INTRAMUSCULAR | Status: DC | PRN

## 2016-08-20 MED ORDER — MIDAZOLAM HCL 2 MG/2ML IJ SOLN
INTRAMUSCULAR | Status: DC | PRN
  Administered 2016-08-20: 2 mg via INTRAVENOUS
  Administered 2016-08-20 (×2): 1 mg via INTRAVENOUS
  Administered 2016-08-20: 2 mg via INTRAVENOUS

## 2016-08-20 MED ORDER — MIDAZOLAM HCL 2 MG/2ML IJ SOLN
INTRAMUSCULAR | Status: AC
Start: 2016-08-20 — End: 2016-08-20
  Filled 2016-08-20: qty 2

## 2016-08-20 MED ORDER — FENTANYL CITRATE (PF) 100 MCG/2ML IJ SOLN
INTRAMUSCULAR | Status: DC | PRN
Start: 2016-08-20 — End: 2016-08-20
  Administered 2016-08-20: 12.5 ug via INTRAVENOUS
  Administered 2016-08-20: 25 ug via INTRAVENOUS
  Administered 2016-08-20: 12.5 ug via INTRAVENOUS

## 2016-08-20 MED ORDER — SODIUM CHLORIDE 0.9% FLUSH: 3.0000 mL | Freq: Two times a day (BID) | INTRAVENOUS | Status: DC

## 2016-08-20 MED ORDER — SODIUM CHLORIDE 0.9 % IV SOLN: 250.0000 mL | INTRAVENOUS | Status: DC | PRN

## 2016-08-20 MED ORDER — ACETAMINOPHEN 325 MG PO TABS: 650.0000 mg | ORAL_TABLET | ORAL | Status: DC | PRN

## 2016-08-20 SURGICAL SUPPLY — 1 items: ELECT DEFIB PAD ADLT CADENCE (PAD) ×1 IMPLANT

## 2016-08-20 NOTE — Progress Notes (Signed)
Pt's blood pressure was 81/58 after a previous reading of 123/91. RN stopped cardizem and called MD. MD gave further orders to restart meds. Will continue to follow.

## 2016-08-20 NOTE — Progress Notes (Signed)
08/20/2016 4:13 PM Discharge AVS meds taken today and those due this evening reviewed.  Follow-up appointments and when to call md reviewed.  D/C IV and TELE.  Questions and concerns addressed.   D/C home([facility]) per orders. [place]

## 2016-08-20 NOTE — Progress Notes (Addendum)
Clinical Social Worker was consulted to assist patient in being placed back in her ALF ([facility]). Patient has been living at the facility for 3 years. CSW reach out to admission coordinator Mariann Laster at the facility and Mariann Laster stated before patient can return FL2 needs to be faxed out. CSW faxed out FL2 to facility. Facility has approved patient to return back to [facility]. Patients son stated via phone that he will be transporting  patient back to facility.  Rhea Pink, MSW,  [place]

## 2016-08-20 NOTE — NC FL2 (Signed)
[place] LEVEL OF CARE SCREENING TOOL     IDENTIFICATION  Patient Name: Glenda [name] Birthdate: 04-22-1924 Sex: female Admission Date (Current Location): 08/19/2016  [facility] and [place] Number:  Herbalist and Address:  [facility], [place] [address] [place],  [address]      Provider Number: [id number]  Attending Physician Name and Address:  Josue Hector, MD  Relative Name and Phone Number:       Current Level of Care: Hospital Recommended Level of Care: [place] Prior Approval Number:    Date Approved/Denied:   PASRR Number:    Discharge Plan: Other (Comment) (ALF)    Current Diagnoses: Patient Active Problem List   Diagnosis Date Noted  . Typical atrial flutter ([place]) 08/19/2016  . Atrial flutter with rapid ventricular response ([place]) 08/19/2016  . Chronic anticoagulation-Eliquis 02/04/2016  . Diastolic CHF, acute on chronic (HCC) 10/07/2015  . Exertional dyspnea 10/06/2015  . Paroxysmal ventricular tachycardia ([place])   . Atrial flutter ([place])   . Malaise and fatigue 12/20/2014  . Shortness of breath 12/13/2014  . Palpitations 12/13/2014  . Vertigo 11/19/2012  . Unspecified constipation 11/06/2012  . Accelerated/malignant hypertension 09/14/2012  . Peripheral edema 07/11/2012  . Aortic stenosis, mild 07/11/2012  . Chronic diastolic HF (heart failure) ([place]) 07/11/2012  . Osteoarthritis 04/03/2012  . Anemia 06/10/2011  . PVC (premature ventricular contraction) 02/16/2011  . Dizziness 12/01/2010  . Insomnia 09/15/2010  . Hypercholesterolemia 09/15/2010    Orientation RESPIRATION BLADDER Height & Weight     Self, Time, Situation, Place  Normal Continent Weight: 122 lb 8 oz (55.6 kg) Height:  5\' 1"  (154.9 cm)  BEHAVIORAL SYMPTOMS/MOOD NEUROLOGICAL BOWEL NUTRITION STATUS      Continent Diet (diet heart room)  AMBULATORY STATUS COMMUNICATION OF NEEDS Skin   Independent Verbally Normal                       Personal Care Assistance Level of Assistance  Bathing, Feeding, Dressing Bathing Assistance: Independent Feeding assistance: Independent Dressing Assistance: Independent     Functional Limitations Info  Sight, Hearing, Speech Sight Info: Adequate Hearing Info: Adequate Speech Info: Adequate    SPECIAL CARE FACTORS FREQUENCY  PT (By licensed PT), OT (By licensed OT)     PT Frequency: 3x wk OT Frequency: 3x wk            Contractures Contractures Info: Not present    Additional Factors Info  Code Status Code Status Info: full code             Current Medications (08/20/2016):  This is the current hospital active medication list Current Facility-Administered Medications  Medication Dose Route Frequency Provider Last Rate Last Dose  . 0.9 %  sodium chloride infusion   Intravenous Continuous Isaiah Serge, NP 999 mL/hr at 08/20/16 1200 250 mL at 08/20/16 1200  . 0.9 %  sodium chloride infusion  250 mL Intravenous PRN Evans Lance, MD      . acetaminophen (TYLENOL) tablet 650 mg  650 mg Oral Q4H PRN Evans Lance, MD      . albuterol (PROVENTIL) (2.5 MG/3ML) 0.083% nebulizer solution 2.5 mg  2.5 mg Nebulization Q6H PRN Josue Hector, MD      . apixaban [facility]) tablet 2.5 mg  2.5 mg Oral BID Cecilie Kicks R, NP   2.5 mg at 08/20/16 1046  . cholecalciferol (VITAMIN D) tablet 1,000 Units  1,000 Units Oral Daily Josue Hector, MD   1,000 Units at 08/20/16 1035  . furosemide (LASIX) tablet 20 mg  20 mg Oral Nicolette Bang, NP   20 mg at 08/20/16 1035  . HYDROcodone-acetaminophen (NORCO) 7.5-325 MG per tablet 1 tablet  1 tablet Oral Daily PRN Isaiah Serge, NP      . metoprolol tartrate (LOPRESSOR) tablet 12.5 mg  12.5 mg Oral Daily Cecilie Kicks R, NP   12.5 mg at 08/20/16 1043  . ondansetron (ZOFRAN) injection 4 mg  4 mg Intravenous Q6H PRN Evans Lance, MD      . ondansetron [facility]) tablet 4 mg  4 mg Oral Q8H PRN Isaiah Serge, NP   4 mg at 08/20/16 0840  . pantoprazole (PROTONIX) EC tablet 40 mg  40 mg Oral Daily Isaiah Serge, NP   40 mg at 08/20/16 1035  . sodium chloride flush (NS) 0.9 % injection 3 mL  3 mL Intravenous Q12H Evans Lance, MD      . sodium chloride flush (NS) 0.9 % injection 3 mL  3 mL Intravenous PRN Evans Lance, MD      . temazepam (RESTORIL) capsule 15 mg  15 mg Oral QHS PRN Isaiah Serge, NP   15 mg at 08/19/16 2257     Discharge Medications: Please see discharge summary for a list of discharge medications.  Relevant Imaging Results:  Relevant Lab Results:   Additional Information SS#[id number]  Wende Neighbors, LCSW

## 2016-08-20 NOTE — Progress Notes (Signed)
Progress Note  Patient Name: Glenda [name] Date of Encounter: 08/20/2016  Primary Cardiologist: Dr Stanford Breed  Subjective   Patient has palpitations but denies significant dyspnea or chest pain.  Inpatient Medications    Scheduled Meds: . apixaban  2.5 mg Oral BID  . cholecalciferol  1,000 Units Oral Daily  . furosemide  20 mg Oral QODAY  . metoprolol tartrate  12.5 mg Oral Daily  . pantoprazole  40 mg Oral Daily   Continuous Infusions: . sodium chloride 10 mL/hr at 08/19/16 1905  . diltiazem (CARDIZEM) infusion 5 mg/hr (08/20/16 0050)   PRN Meds: acetaminophen, albuterol, HYDROcodone-acetaminophen, ondansetron (ZOFRAN) IV, ondansetron, temazepam   Vital Signs    Vitals:   08/19/16 2335 08/20/16 0043 08/20/16 0205 08/20/16 0354  BP: 97/71 (!) 81/58 (!) 84/58 94/76  Pulse: (!) 128 (!) 122 (!) 127 (!) 129  Resp:    19  Temp:    98.1 F (36.7 C)  TempSrc:    Oral  SpO2:    96%  Weight:    55.6 kg (122 lb 8 oz)  Height:        Intake/Output Summary (Last 24 hours) at 08/20/16 0738 Last data filed at 08/20/16 0548  Gross per 24 hour  Intake            288.5 ml  Output              950 ml  Net           -661.5 ml   Filed Weights   08/19/16 1639 08/20/16 0354  Weight: 56.9 kg (125 lb 6.4 oz) 55.6 kg (122 lb 8 oz)    Telemetry    Atrial flutter with rapid ventricular response- Personally Reviewed  ECG    Typical atrial flutter- Personally Reviewed  Physical Exam   GEN: No acute distress.  Thin Neck: No JVD Cardiac: tachycardic and regular Respiratory: Clear to auscultation bilaterally. GI: Soft, nontender, non-distended  MS: No edema; No deformity. Neuro:  Nonfocal  Psych: Normal affect   Labs    Chemistry Recent Labs Lab 08/19/16 2227 08/20/16 0533  NA 135 135  K 4.3 4.1  CL 102 102  CO2 26 26  GLUCOSE 135* 100*  BUN 26* 23*  CREATININE 0.94 0.83  CALCIUM 9.0 8.6*  PROT 6.6  --   ALBUMIN 3.5  --   AST 24  --   ALT 15  --     ALKPHOS 68  --   BILITOT 0.4  --   GFRNONAA 52* >60  GFRAA >60 >60  ANIONGAP 7 7     Hematology Recent Labs Lab 08/19/16 1638 08/20/16 0533  WBC 5.6 4.5  RBC 4.24 3.90  HGB 13.0 11.2*  HCT 39.1 35.4*  MCV 92.2 90.8  MCH 30.7 28.7  MCHC 33.2 31.6  RDW 14.2 13.8  PLT 195 175    Cardiac Enzymes Recent Labs Lab 08/19/16 1638 08/19/16 2227  TROPONINI 0.05* 0.05*    BNP Recent Labs Lab 08/19/16 1638  BNP 479.4*    Radiology    Portable Chest X-ray 1 View  Result Date: 08/19/2016 CLINICAL DATA:  83 year old with acute onset of shortness of breath. EXAM: PORTABLE CHEST 1 VIEW COMPARISON:  CTA chest 10/06/2015. Chest x-rays 10/06/2015, 03/01/2012 and earlier. FINDINGS: Cardiac silhouette markedly enlarged, unchanged. Thoracic aorta tortuous and atherosclerotic, unchanged. Hilar and mediastinal contours otherwise unremarkable. Lungs clear. Bronchovascular markings normal. Pulmonary vascularity normal. No visible pleural effusions. No pneumothorax. IMPRESSION: Stable marked  cardiomegaly.  No acute cardiopulmonary disease. Electronically Signed   By: Evangeline Dakin M.D.   On: 08/19/2016 20:23    Patient Profile     83 y.o. female admitted with recurrent atrial flutter. Last echocardiogram October 2017 showed normal LV function, mild aortic and mitral regurgitation and mild left atrial enlargement.  Assessment & Plan    1 Atrial flutter-patient has recurrent typical atrial flutter with rapid ventricular response. Her heart rate is elevated. Continue low-dose Cardizem and metoprolol. She has been compliant with her apixaban. I will arrange a cardioversion today. If she remains in sinus could then discharge either later today or in the morning. Follow-up with electrophysiology to consider ablation. If felt not to be a candidate for treatment with amiodarone.  2 Hypertension-blood pressure is controlled. Continue present medications.  3 hyperlipidemia-continue  statin.  4 chronic diastolic congestive heart failure-patient appears to be euvolemic. Continue present dose of diuretic.   Signed, Kirk Ruths, MD  08/20/2016, 7:38 AM

## 2016-08-20 NOTE — H&P (Signed)
Progress Note  Patient Name: Glenda [name] Date of Encounter: 08/20/2016  Primary Cardiologist: Dr Stanford Breed  Subjective   Patient has palpitations but denies significant dyspnea or chest pain.  Inpatient Medications    Scheduled Meds: . apixaban  2.5 mg Oral BID  . cholecalciferol  1,000 Units Oral Daily  . furosemide  20 mg Oral QODAY  . metoprolol tartrate  12.5 mg Oral Daily  . pantoprazole  40 mg Oral Daily   Continuous Infusions: . sodium chloride 10 mL/hr at 08/19/16 1905  . diltiazem (CARDIZEM) infusion 5 mg/hr (08/20/16 0050)   PRN Meds: acetaminophen, albuterol, HYDROcodone-acetaminophen, ondansetron (ZOFRAN) IV, ondansetron, temazepam   Vital Signs          Vitals:   08/19/16 2335 08/20/16 0043 08/20/16 0205 08/20/16 0354  BP: 97/71 (!) 81/58 (!) 84/58 94/76  Pulse: (!) 128 (!) 122 (!) 127 (!) 129  Resp:    19  Temp:    98.1 F (36.7 C)  TempSrc:    Oral  SpO2:    96%  Weight:    55.6 kg (122 lb 8 oz)  Height:        Intake/Output Summary (Last 24 hours) at 08/20/16 0738 Last data filed at 08/20/16 0548  Gross per 24 hour  Intake            288.5 ml  Output              950 ml  Net           -661.5 ml       Filed Weights   08/19/16 1639 08/20/16 0354  Weight: 56.9 kg (125 lb 6.4 oz) 55.6 kg (122 lb 8 oz)    Telemetry    Atrial flutter with rapid ventricular response- Personally Reviewed  ECG    Typical atrial flutter- Personally Reviewed  Physical Exam   GEN:No acute distress.  Thin Neck:No JVD Cardiac:tachycardic and regular Respiratory:Clear to auscultation bilaterally. YQ:MVHQ, nontender, non-distended  MS:No edema; No deformity. Neuro:Nonfocal  Psych: Normal affect   Labs    Chemistry Last Labs    Recent Labs Lab 08/19/16 2227 08/20/16 0533  NA 135 135  K 4.3 4.1  CL 102 102  CO2 26 26  GLUCOSE 135* 100*  BUN 26* 23*  CREATININE 0.94 0.83  CALCIUM 9.0 8.6*    PROT 6.6  --   ALBUMIN 3.5  --   AST 24  --   ALT 15  --   ALKPHOS 68  --   BILITOT 0.4  --   GFRNONAA 52* >60  GFRAA >60 >60  ANIONGAP 7 7       Hematology Last Labs    Recent Labs Lab 08/19/16 1638 08/20/16 0533  WBC 5.6 4.5  RBC 4.24 3.90  HGB 13.0 11.2*  HCT 39.1 35.4*  MCV 92.2 90.8  MCH 30.7 28.7  MCHC 33.2 31.6  RDW 14.2 13.8  PLT 195 175      Cardiac Enzymes Last Labs    Recent Labs Lab 08/19/16 1638 08/19/16 2227  TROPONINI 0.05* 0.05*      BNP Last Labs    Recent Labs Lab 08/19/16 1638  BNP 479.4*      Radiology     Imaging Results (Last 48 hours)  Portable Chest X-ray 1 View  Result Date: 08/19/2016 CLINICAL DATA:  83 year old with acute onset of shortness of breath. EXAM: PORTABLE CHEST 1 VIEW COMPARISON:  CTA chest 10/06/2015. Chest x-rays 10/06/2015,  03/01/2012 and earlier. FINDINGS: Cardiac silhouette markedly enlarged, unchanged. Thoracic aorta tortuous and atherosclerotic, unchanged. Hilar and mediastinal contours otherwise unremarkable. Lungs clear. Bronchovascular markings normal. Pulmonary vascularity normal. No visible pleural effusions. No pneumothorax. IMPRESSION: Stable marked cardiomegaly.  No acute cardiopulmonary disease. Electronically Signed   By: Evangeline Dakin M.D.   On: 08/19/2016 20:23     Patient Profile     83 y.o. female admitted with recurrent atrial flutter. Last echocardiogram October 2017 showed normal LV function, mild aortic and mitral regurgitation and mild left atrial enlargement.  Assessment & Plan    1 Atrial flutter-patient has recurrent typical atrial flutter with rapid ventricular response. Her heart rate is elevated. Continue low-dose Cardizem and metoprolol. She has been compliant with her apixaban. I will arrange a cardioversion today. If she remains in sinus could then discharge either later today or in the morning. Follow-up with electrophysiology to consider ablation. If felt not  to be a candidate for treatment with amiodarone. I have discussed the indications, risks/benefits/goals/expectations of DCCV and she wishes to proceed.  2 Hypertension-blood pressure is controlled. Continue present medications.  3 hyperlipidemia-continue statin.  4 chronic diastolic congestive heart failure-patient appears to be euvolemic. Continue present dose of diuretic.   Mikle Bosworth.D.

## 2016-08-20 NOTE — Discharge Summary (Signed)
Discharge Summary    Patient ID: Glenda [name],  MRN: [id number], DOB/AGE: 1924-05-02 83 y.o.  Admit date: 08/19/2016 Discharge date: 08/20/2016   Primary Care Provider: Dorothyann Peng Primary Cardiologist: Dr. Stanford Breed  Discharge Diagnoses    Principal Problem:   Typical atrial flutter [facility]) Active Problems:   Aortic stenosis, mild   Chronic diastolic HF (heart failure) (HCC)   Shortness of breath   Malaise and fatigue   Chronic anticoagulation-Eliquis   Atrial flutter with rapid ventricular response (HCC)   History of cardioversion   Allergies Allergies  Allergen Reactions  . Ace Inhibitors Other (See Comments)    Cough  . Amitriptyline Other (See Comments)    hyper  . Avelox [Moxifloxacin Hcl In Nacl] Other (See Comments)    dizziness  . Halcion [Triazolam] Other (See Comments)    Doesn't recall  . Pentazocine Lactate Other (See Comments)    Doesn't recall  . Sulfa Drugs Cross Reactors Other (See Comments)    Doesn't recall  . Trazodone And Nefazodone Other (See Comments)    Not effective  . Penicillins Rash    No other information available at this time 10/06/15     History of Present Illness     Glenda [name] is a 83 y.o. female who presents for recurrent atrial flutter.    Last seen by Dr. Stanford Breed 08/06/16  She has a hx of hypertension and aortic stenosis. History of atypical chest pain and palpitations. She had a exercise treadmill test on 01/26/11 negative for ischemia. 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  Admitted July 2017 with atrial flutter vs atrial tachycardia and underwent TEE guided cardioversion. Echo showed ejection fraction 40-45% at that time Echo 12/2015 with EF 55-60%,  Mild regurg, of aortic valve, mild regurg of Mitral valve.    Cardiac cath 2002  IMPRESSION: 1. Mild to moderate coronary atherosclerotic heart disease, predominately involving the LAD with calcification proximally and luminal irregularities, and mild ostial right  coronary artery disease. 2. 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 3. Normal left ventricular function.  Today she had called with HR at 130 and BP 95/68.  Currently in a flutter with RVR at 129.  She has not felt well for several days, kept thinking she would feel better.  No chest pain and no SOB.  Her most recent EKG with SB at 48.  Only on lopressor 12.5 mg BID.  She is weak and SOB especially with activity.  No chest pain.  She has not missed any Eliquis.     Hospital Course     Consultants: None  She was admitted to cardiology at [facility] on 08/19/16. Cardizem drip was started and ran at 10 mg/hr overnight for better rate control. She underwent DCCV without TEE on 08/20/16. She has been continuously anticoagulated with eliquis. The cardioversion was successful and she is now in NSR. All medications were continued, including eliquis, lopressor, and lasix. No medication changes were made this hospitalization. Low dose cardizem was not added given her marginal blood pressures (80-90s / 40-50s) and heart rate in the 50s. The patient has been ambulated.   Narcotic medications listed in home medications were continued, no changes made from previous MAR.   Patient seen and examined by Dr. Stanford Breed today and was stable for discharge. All follow up has been arranged.  _____________  Discharge Vitals Blood pressure (!) 99/52, pulse (!) 45, temperature 98.1 F (36.7 C), temperature  source Oral, resp. rate 18, height 5\' 1"  (1.549 m), weight 122 lb 8 oz (55.6 kg), SpO2 97 %.  Filed Weights   08/19/16 1639 08/20/16 0354  Weight: 125 lb 6.4 oz (56.9 kg) 122 lb 8 oz (55.6 kg)    Labs & Radiologic Studies    CBC  Recent Labs  08/19/16 1638 08/20/16 0533  WBC 5.6 4.5  NEUTROABS 3.5  --   HGB 13.0 11.2*  HCT 39.1 35.4*  MCV 92.2 90.8  PLT 195 798   Basic Metabolic Panel  Recent Labs  08/19/16 1638 08/19/16 2227 08/20/16 0533  NA  133* 135 135  K 4.3 4.3 4.1  CL 97* 102 102  CO2 27 26 26   GLUCOSE 100* 135* 100*  BUN 28* 26* 23*  CREATININE 1.11* 0.94 0.83  CALCIUM 9.1 9.0 8.6*  MG 2.2  --   --    Liver Function Tests  Recent Labs  08/19/16 1638 08/19/16 2227  AST 25 24  ALT 15 15  ALKPHOS 71 68  BILITOT 0.5 0.4  PROT 7.4 6.6  ALBUMIN 4.3 3.5   No results for input(s): LIPASE, AMYLASE in the last 72 hours. Cardiac Enzymes  Recent Labs  08/19/16 1638 08/19/16 2227  TROPONINI 0.05* 0.05*   BNP Invalid input(s): POCBNP D-Dimer No results for input(s): DDIMER in the last 72 hours. Hemoglobin A1C  Recent Labs  08/19/16 1638  HGBA1C 5.7*   Fasting Lipid Panel  Recent Labs  08/20/16 0533  CHOL 182  HDL 73  LDLCALC 100*  TRIG 47  CHOLHDL 2.5   Thyroid Function Tests  Recent Labs  08/19/16 1638  TSH 1.824   _____________  Portable Chest X-ray 1 View  Result Date: 08/19/2016 CLINICAL DATA:  83 year old with acute onset of shortness of breath. EXAM: PORTABLE CHEST 1 VIEW COMPARISON:  CTA chest 10/06/2015. Chest x-rays 10/06/2015, 03/01/2012 and earlier. FINDINGS: Cardiac silhouette markedly enlarged, unchanged. Thoracic aorta tortuous and atherosclerotic, unchanged. Hilar and mediastinal contours otherwise unremarkable. Lungs clear. Bronchovascular markings normal. Pulmonary vascularity normal. No visible pleural effusions. No pneumothorax. IMPRESSION: Stable marked cardiomegaly.  No acute cardiopulmonary disease. Electronically Signed   By: Evangeline Dakin M.D.   On: 08/19/2016 20:23     Diagnostic Studies/Procedures    EKG pending Telemetry with NSR in the 60s  DCCV 08/20/16: successful conversion to NSR   Echocardiogram 01/14/16: Study Conclusions - Left ventricle: The cavity size was normal. There was mild   concentric hypertrophy. Systolic function was normal. The   estimated ejection fraction was in the range of 55% to 60%. - Aortic valve: There was mild  regurgitation. - Mitral valve: There was mild regurgitation. - Left atrium: The atrium was mildly dilated. - Atrial septum: No defect or patent foramen ovale was identified.  Disposition   Pt is being discharged to SNF today in good condition.  Follow-up Plans & Appointments    Follow-up Information    Baldwin Jamaica, PA-C Follow up on 08/26/2016.   Specialty:  Cardiology Why:  11:00 AM for hospital follow up and consideration for ablation Contact information: [place] [id number] [phone]          Discharge Instructions    Diet - low sodium heart healthy    Complete by:  As directed    Increase activity slowly    Complete by:  As directed       Discharge Medications   Current Discharge Medication List  CONTINUE these medications which have NOT CHANGED   Details  amLODipine (NORVASC) 5 MG tablet Take 5 mg by mouth daily.    apixaban (ELIQUIS) 2.5 MG TABS tablet Take 1 tablet (2.5 mg total) by mouth 2 (two) times daily. Qty: 60 tablet, Refills: 4    Cranberry 425 MG CAPS Take 425 mg by mouth 2 (two) times daily.    docusate sodium (COLACE) 100 MG capsule Take 100 mg by mouth 2 (two) times daily.    furosemide (LASIX) 20 MG tablet Take 20 mg by mouth daily.     HYDROcodone-acetaminophen (NORCO) 7.5-325 MG tablet Take 7.5-325 tablets by mouth 3 (three) times daily.    losartan (COZAAR) 50 MG tablet Take 1 tablet (50 mg total) by mouth daily. Qty: 30 tablet, Refills: 11    metoprolol tartrate (LOPRESSOR) 25 MG tablet Take 0.5 tablets (12.5 mg total) by mouth daily. Qty: 30 tablet, Refills: 6    Multiple Vitamins-Minerals (CEROVITE ADVANCED FORMULA PO) Take 1 tablet by mouth daily.    omeprazole (PRILOSEC) 20 MG capsule Take 20 mg by mouth daily.    oxyCODONE-acetaminophen (PERCOCET/ROXICET) 5-325 MG tablet Take 1 tablet by mouth 3 (three) times daily.    polyethylene glycol (MIRALAX / GLYCOLAX) packet Take 17 g by mouth  daily. Taken  Daily Monday to Saturday.    temazepam (RESTORIL) 15 MG capsule Take 15 mg by mouth at bedtime as needed.       STOP taking these medications     ondansetron (ZOFRAN) 4 MG tablet           Outstanding Labs/Studies   None  Consider cardizem  Duration of Discharge Encounter   Greater than 30 minutes including physician time.  Signed, Tami Lin Duke PA-C 08/20/2016, 2:36 PM

## 2016-08-26 ENCOUNTER — Ambulatory Visit (INDEPENDENT_AMBULATORY_CARE_PROVIDER_SITE_OTHER): Payer: Medicare Other | Admitting: Physician Assistant

## 2016-08-26 VITALS — BP 152/68 | HR 50 | Ht 61.0 in | Wt 123.0 lb

## 2016-08-26 DIAGNOSIS — I4892 Unspecified atrial flutter: Secondary | ICD-10-CM

## 2016-08-26 DIAGNOSIS — I10 Essential (primary) hypertension: Secondary | ICD-10-CM | POA: Diagnosis not present

## 2016-08-26 DIAGNOSIS — I509 Heart failure, unspecified: Secondary | ICD-10-CM | POA: Diagnosis not present

## 2016-08-26 MED ORDER — AMLODIPINE BESYLATE 5 MG PO TABS: 2.5000 mg | ORAL_TABLET | Freq: Every day | ORAL | 3 refills | Status: DC

## 2016-08-26 MED ORDER — FUROSEMIDE 20 MG PO TABS: 30.0000 mg | ORAL_TABLET | Freq: Every day | ORAL | 3 refills | Status: DC

## 2016-08-26 NOTE — Patient Instructions (Signed)
Medication Instructions:   START TAKING NORVASC 2.5 MG ONCE A DAY    START TAKING  LASIX  30 MG ONCE A DAY  (TABLET AND  HALF OF 20 MG )   If you need a refill on your cardiac medications before your next appointment, please call your pharmacy.  Labwork: NONE ORDERED  TODAY    Testing/Procedures: NONE ORDERED  TODAY     Follow-Up:  IN 2 MONTHS WITH TAYLOR ONLY MESSAGE MELISSA IF NONE AVAILABLE    Any Other Special Instructions Will Be Listed Below (If Applicable).   Cardiac Ablation Cardiac ablation is a procedure to disable (ablate) a small amount of heart tissue in very specific places. The heart has many electrical connections. Sometimes these connections are abnormal and can cause the heart to beat very fast or irregularly. Ablating some of the problem areas can improve the heart rhythm or return it to normal. Ablation may be done for people who:  Have Wolff-Parkinson-White syndrome.  Have fast heart rhythms (tachycardia).  Have taken medicines for an abnormal heart rhythm (arrhythmia) that were not effective or caused side effects.  Have a high-risk heartbeat that may be life-threatening.  During the procedure, a small incision is made in the neck or the groin, and a long, thin, flexible tube (catheter) is inserted into the incision and moved to the heart. Small devices (electrodes) on the tip of the catheter will send out electrical currents. A type of X-ray (fluoroscopy) will be used to help guide the catheter and to provide images of the heart. Tell a health care provider about:  Any allergies you have.  All medicines you are taking, including vitamins, herbs, eye drops, creams, and over-the-counter medicines.  Any problems you or family members have had with anesthetic medicines.  Any blood disorders you have.  Any surgeries you have had.  Any medical conditions you have, such as kidney failure.  Whether you are pregnant or may be pregnant. What are the  risks? Generally, this is a safe procedure. However, problems may occur, including:  Infection.  Bruising and bleeding at the catheter insertion site.  Bleeding into the chest, especially into the sac that surrounds the heart. This is a serious complication.  Stroke or blood clots.  Damage to other structures or organs.  Allergic reaction to medicines or dyes.  Need for a permanent pacemaker if the normal electrical system is damaged. A pacemaker is a small computer that sends electrical signals to the heart and helps your heart beat normally.  The procedure not being fully effective. This may not be recognized until months later. Repeat ablation procedures are sometimes required.  What happens before the procedure?  Follow instructions from your health care provider about eating or drinking restrictions.  Ask your health care provider about: ? Changing or stopping your regular medicines. This is especially important if you are taking diabetes medicines or blood thinners. ? Taking medicines such as aspirin and ibuprofen. These medicines can thin your blood. Do not take these medicines before your procedure if your health care provider instructs you not to.  Plan to have someone take you home from the hospital or clinic.  If you will be going home right after the procedure, plan to have someone with you for 24 hours. What happens during the procedure?  To lower your risk of infection: ? Your health care team will wash or sanitize their hands. ? Your skin will be washed with soap. ? Hair may be removed from  the incision area.  An IV tube will be inserted into one of your veins.  You will be given a medicine to help you relax (sedative).  The skin on your neck or groin will be numbed.  An incision will be made in your neck or your groin.  A needle will be inserted through the incision and into a large vein in your neck or groin.  A catheter will be inserted into the needle  and moved to your heart.  Dye may be injected through the catheter to help your surgeon see the area of the heart that needs treatment.  Electrical currents will be sent from the catheter to ablate heart tissue in desired areas. There are three types of energy that may be used to ablate heart tissue: ? Heat (radiofrequency energy). ? Laser energy. ? Extreme cold (cryoablation).  When the necessary tissue has been ablated, the catheter will be removed.  Pressure will be held on the catheter insertion area to prevent excessive bleeding.  A bandage (dressing) will be placed over the catheter insertion area. The procedure may vary among health care providers and hospitals. What happens after the procedure?  Your blood pressure, heart rate, breathing rate, and blood oxygen level will be monitored until the medicines you were given have worn off.  Your catheter insertion area will be monitored for bleeding. You will need to lie still for a few hours to ensure that you do not bleed from the catheter insertion area.  Do not drive for 24 hours or as long as directed by your health care provider. Summary  Cardiac ablation is a procedure to disable (ablate) a small amount of heart tissue in very specific places. Ablating some of the problem areas can improve the heart rhythm or return it to normal.  During the procedure, electrical currents will be sent from the catheter to ablate heart tissue in desired areas. This information is not intended to replace advice given to you by your health care provider. Make sure you discuss any questions you have with your health care provider. Document Released: 07/25/2008 Document Revised: 01/26/2016 Document Reviewed: 01/26/2016 Elsevier Interactive Patient Education  [employer].

## 2016-08-26 NOTE — Progress Notes (Signed)
Cardiology Office Note Date:  08/26/2016  Patient ID:  Glenda [name], Glenda [name] Dec 03, 1924, MRN [id number] PCP:  Dorothyann Peng, NP  Cardiologist:  Dr. Stanford Breed    Chief Complaint: discuss aflutter ablation  History of Present Illness: Glenda [name] is a 83 y.o. female with history of HTN, hx of CP with cath in 2002 without significant CAD noting though a significant myocardial bridge in mid LAD, CBP, chronic CHF (diastolic/systolic), and VHD w/AS (none decribed on last echo, though her last echo noted improved EF, PAFlutter.  She was admitted to [facility] 08/19/16 with rapid AFlutter, she had been compliant with her a/c and underwent DDCV, discharged 08/20/16.  She comes to the office today to be sen for Dr. Lovena Le, she was last seen by him at a hospital stay where he did a DCCV 2/2 AFlutter.  The patient comes today she thought to discuss ablation with Dr. Lovena Le.  She is feeling well since her hospital stay.  She has not had any racing heart beats since discharge.  She mentions swollen ankles, this is not new necessarily, but on/off perhaps for a year or more, but "flared up" in the last 2-3 days.  She has a chronic component of DOE, she says after her meals she gets up and walks around the dining area to move around and will feel a little winded doing this, this is also not new.  She denies any dizziness, no near syncope syncope, no bleeding or signs of bleeding.  She is a resident at [employer].   Past Medical History:  Diagnosis Date  . Aortic stenosis    Echo 02/2011 showing mild AS with normal LV systolic function  . Arthritis    "lower back" (10/09/2015)  . Atrial flutter with rapid ventricular response ([place]) 08/19/2016   s/p successful DCCV on 08/20/16, continue eliquis  . Chronic bronchitis ([place])    "off and on; several years" (10/09/2015)  . Chronic diastolic CHF (congestive heart failure) ([place])    a. 12/2015 Echo: EF 55-60%, mild AI/MR, mildly dil LA.  Marland Kitchen Chronic lower back pain   .  Complication of anesthesia    "they had trouble waking me up after colon resection"  . DDD (degenerative disc disease), lumbar    severe facet dz and adv DDD MRI L spine 2009  . Diverticulosis   . GERD (gastroesophageal reflux disease)   . Hiatal hernia    hx  . Hypercholesterolemia   . Hypertension   . Insomnia   . OA (osteoarthritis) of knee   . Paroxysmal atrial flutter ([place])    a. 09/2015 s/p TEE/DCCV;  b. CHA2DS2VASc = 5-->Eliquis.  . Peripheral edema   . PVCs (premature ventricular contractions)   . Sacral fracture ([place]) 11/06/2012  . TIA (transient ischemic attack)    pt denies this hx on 10/09/2015 "it was the [place] I was taking; they had thought I was having a stroke"    Past Surgical History:  Procedure Laterality Date  . BREAST BIOPSY Left   . CARDIAC CATHETERIZATION  02/17/2001   MILD REGURGITATION. EF 60%  . CARDIOVERSION N/A 10/08/2015   Procedure: CARDIOVERSION;  Surgeon: Sanda Klein, MD;  Location: [facility] ENDOSCOPY;  Service: Cardiovascular;  Laterality: N/A;  . CARDIOVERSION N/A 08/20/2016   Procedure: Cardioversion;  Surgeon: Evans Lance, MD;  Location: [facility] CV LAB;  Service: Cardiovascular;  Laterality: N/A;  . CATARACT EXTRACTION W/ INTRAOCULAR LENS  IMPLANT, BILATERAL Bilateral   . CESAREAN SECTION  1954  .  COLECTOMY    . COLONOSCOPY    . EXCISIONAL HEMORRHOIDECTOMY    . INGUINAL HERNIA REPAIR Bilateral   . KYPHOPLASTY Right 09/11/2012   Procedure: Right Acrylic Sacroplasty;  Surgeon: Kristeen Miss, MD;  Location: [facility] NEURO ORS;  Service: Neurosurgery;  Laterality: Right;  Right  Acrylic Sacroplasty  . TEE WITHOUT CARDIOVERSION N/A 10/08/2015   Procedure: TRANSESOPHAGEAL ECHOCARDIOGRAM (TEE);  Surgeon: Sanda Klein, MD;  Location[facility] END[facility]COPY;  Service: Cardiovascular;  Laterality: N/A;  . TOTAL KNEE ARTHROPLASTY Right   . UMBILICAL HERNIA REPAIR      Current Outpatient Prescriptions  Medication Sig Dispense Refill  . amLODipine (NORVASC) 5 MG  tablet Take 0.5 tablets (2.5 mg total) by mouth daily. 30 tablet 3  . apixaban (ELIQUIS) 2.5 MG TABS tablet Take 1 tablet (2.5 mg total) by mouth 2 (two) times daily. 60 tablet 4  . Cranberry 425 MG CAPS Take 425 mg by mouth 2 (two) times daily.    Marland Kitchen docusate sodium (COLACE) 100 MG capsule Take 100 mg by mouth 2 (two) times daily.    . furosemide (LASIX) 20 MG tablet Take 1.5 tablets (30 mg total) by mouth daily. 45 tablet 3  . HYDROcodone-acetaminophen (NORCO) 7.5-325 MG tablet Take 7.5-325 tablets by mouth 3 (three) times daily.    Marland Kitchen losartan (COZAAR) 50 MG tablet Take 1 tablet (50 mg total) by mouth daily. 30 tablet 11  . metoprolol tartrate (LOPRESSOR) 25 MG tablet Take 0.5 tablets (12.5 mg total) by mouth daily. 30 tablet 6  . Multiple Vitamins-Minerals (CEROVITE ADVANCED FORMULA PO) Take 1 tablet by mouth daily.    Marland Kitchen omeprazole (PRILOSEC) 20 MG capsule Take 20 mg by mouth daily.    . polyethylene glycol (MIRALAX / GLYCOLAX) packet Take 17 g by mouth daily. Taken  Daily Monday to Saturday.    . temazepam (RESTORIL) 15 MG capsule Take 15 mg by mouth at bedtime as needed.      No current facility-administered medications for this visit.     Allergies:   Ace inhibitors; Amitriptyline; Avelox [moxifloxacin hcl in nacl]; Halcion [triazolam]; Pentazocine lactate; Sulfa drugs cross reactors; Trazodone and nefazodone; and Penicillins   Social History:  The patient  reports that she has never smoked. She has never used smokeless tobacco. She reports that she does not drink alcohol or use drugs.   Family History:  The patient's family history includes Heart disease in her mother and sister; Hypertension in her father and sister.  ROS:  Please see the history of present illness.   All other systems are reviewed and otherwise negative.   PHYSICAL EXAM:  VS:  BP (!) 152/68   Pulse (!) 50   Ht 5\' 1"  (1.549 m)   Wt 123 lb (55.8 kg)   BMI 23.24 kg/m  BMI: Body mass index is 23.24 kg/m. Well  nourished, thin/petite, well developed, in no acute distress  HEENT: normocephalic, atraumatic  Neck: no JVD, carotid bruits or masses Cardiac:  RRR; soft SM, no rubs, or gallops Lungs:  CTA b/l, no wheezing, rhonchi or rales  Abd: soft, nontender MS: no deformity, age appropriate atrophy Ext: 1+ edema to ankles Skin: warm and dry, no rash Neuro:  No gross deficits appreciated Psych: euthymic mood, full affect   EKG:  Done today and reviewed by myself is SB, 50bpm, borderline 1st degree AVblock, PR 229ms, QRS 22ms, QTc 453ms  08/20/16: DCCV, Dr. Lovena Le Conclusion: successful DCCV with 200 joules of synchronized biphasic energy, restoring NSR.  01/14/16:  TTE Study Conclusions - Left ventricle: The cavity size was normal. There was mild   concentric hypertrophy. Systolic function was normal. The   estimated ejection fraction was in the range of 55% to 60%. - Aortic valve: There was mild regurgitation. - Mitral valve: There was mild regurgitation. - Left atrium: The atrium was mildly dilated. - Atrial septum: No defect or patent foramen ovale was identified.  10/08/15: TEE Study Conclusions - Left ventricle: The cavity size was normal. Wall thickness was   normal. Systolic function was moderately to severely reduced. The   estimated ejection fraction was in the range of 30% to 35%.   Moderate diffuse hypokinesis with no identifiable regional   variations. - Aortic valve: No evidence of vegetation. There was mild   regurgitation. - Mitral valve: There was moderate regurgitation directed   centrally. - Left atrium: The atrium was mildly dilated. No evidence of   thrombus in the atrial cavity or appendage. No evidence of   thrombus in the atrial cavity or appendage. No spontaneous echo   contrast was observed. The appendage was multilobulated and   dilated. Emptying velocity was normal. - Right atrium: No evidence of thrombus in the atrial cavity or   appendage. No evidence of  thrombus in the atrial cavity or   appendage. No spontaneous echo contrast was observed. - Atrial septum: No defect or patent foramen ovale was identified. - Tricuspid valve: No evidence of vegetation. No evidence of   vegetation. - Pulmonic valve: No evidence of vegetation. - Pericardium, extracardiac: A trivial pericardial effusion was   identified. Impressions: - Review of atrial appendage Doppler flow demonstrates that the   rhythm is an atrial tachycardia with 1:1 conduction.Following   cardioversion, the rhythm was marked sinus bradycardia with   frequent PACs.   10/07/15 TTE Study Conclusions - Left ventricle: The cavity size was normal. There was mild   concentric hypertrophy. Systolic function was mildly to   moderately reduced. The estimated ejection fraction was in the   range of 40% to 45%. Diffuse hypokinesis. Normal sinus rhythm was   absent. The study is not technically sufficient to allow   evaluation of LV diastolic function. Doppler parameters are   consistent with high ventricular filling pressure. - Aortic valve: Moderately calcified annulus. Trileaflet. Moderate   diffuse thickening and calcification, consistent with sclerosis.   There was trivial regurgitation. - Mitral valve: Moderate diffuse thickening of the anterior   leaflet. There was mild to moderate regurgitation. - Right atrium: The atrium was mildly dilated. - Pulmonary arteries: PA peak pressure: 42 mm Hg (S). - Pericardium, extracardiac: A trivial pericardial effusion was   identified posterior to the heart. Impressions: - The right ventricular systolic pressure was increased consistent   with moderate pulmonary hypertension.   Recent Labs: 08/19/2016: ALT 15; B Natriuretic Peptide 479.4; Magnesium 2.2; TSH 1.824 08/20/2016: BUN 23; Creatinine, Ser 0.83; Hemoglobin 11.2; Platelets 175; Potassium 4.1; Sodium 135  08/20/2016: Cholesterol 182; HDL 73; LDL Cholesterol 100; Total CHOL/HDL Ratio 2.5;  Triglycerides 47; VLDL 9   Estimated Creatinine Clearance: 34.7 mL/min (by C-G formula based on SCr of 0.83 mg/dL).   Wt Readings from Last 3 Encounters:  08/26/16 123 lb (55.8 kg)  08/20/16 122 lb 8 oz (55.6 kg)  08/19/16 127 lb 6.4 oz (57.8 kg)     Other studies reviewed: Additional studies/records reviewed today include: summarized above  ASSESSMENT AND PLAN:  1. AFlutter, TEE result last year describes ATAch as well with  1:1 conduction     CHA2DS2Vasc is 5, on Eliquis, appropriately dose adjusted for age/weight     discussed importance of compliance with her a/c, she tells me the girls at the ALF make sure she takes her medicines.  2. HTN     A recheck is 140/80  3. Hx of reduced EF, CHF     Last EF was better, ?tachy mediated     edema that waxes/wanes   Disposition: Decrease her amlodipine to 2.5mg  daily and increase her lasix to 1.5 tab (30mg ) daily.  She was not terribly upset today about not seeing Dr. Lovena Le, we did discuss ablation procedure but she would like to come back with her son and see Dr. Lovena Le.  She tells me she is not particularly inclined to want a procedure.  Will make an appointment with Dr. Lovena Le in 2 months, she would like to see how she does with her tachycardia a bit first.  Current medicines are reviewed at length with the patient today.  The patient did not have any concerns regarding medicines.  We will fax an order to have her get a BMET done in 1 week given change in lasix.  Haywood Lasso, PA-C 08/26/2016 1:16 PM     [address] [place] [address] [phone] (office)  [phone] (fax)

## 2016-10-21 ENCOUNTER — Ambulatory Visit: Payer: Medicare Other | Admitting: Internal Medicine

## 2017-01-25 NOTE — Progress Notes (Signed)
HPI: FU hypertension and aortic stenosis. History of atypical chest pain and palpitations. She had a exercise treadmill test on 01/26/11 which showed no evidence of ischemia. 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 Admitted July 2017 with atrial flutter vs atrial tachycardia and underwent TEE guided cardioversion. Echocardiogram October 2017 showed normal LV function, mild aortic insufficiency and mitral regurgitation and mild left atrial enlargement.Patient had recurrent atrial flutter May 2018 and had repeat cardioversion. Appointment was scheduled to see Dr. Lovena Le for consideration of ablation but he was not available and seen by [facility]; she was inclined not to pursue procedures. Since last seen, she denies increased dyspnea. There is no exertional chest pain. She does have problems with palpitations. No syncope.  Current Outpatient Medications  Medication Sig Dispense Refill  . amLODipine (NORVASC) 5 MG tablet Take 0.5 tablets (2.5 mg total) by mouth daily. 30 tablet 3  . apixaban (ELIQUIS) 2.5 MG TABS tablet Take 1 tablet (2.5 mg total) by mouth 2 (two) times daily. 60 tablet 4  . Cranberry 425 MG CAPS Take 425 mg by mouth 2 (two) times daily.    Marland Kitchen docusate sodium (COLACE) 100 MG capsule Take 100 mg by mouth 2 (two) times daily.    . furosemide (LASIX) 20 MG tablet Take 1.5 tablets (30 mg total) by mouth daily. 45 tablet 3  . HYDROcodone-acetaminophen (NORCO) 7.5-325 MG tablet Take 7.5-325 tablets by mouth 3 (three) times daily.    Marland Kitchen losartan (COZAAR) 50 MG tablet Take 1 tablet (50 mg total) by mouth daily. 30 tablet 11  . metoprolol tartrate (LOPRESSOR) 25 MG tablet Take 0.5 tablets (12.5 mg total) by mouth daily. 30 tablet 6  . Multiple Vitamins-Minerals (CEROVITE ADVANCED FORMULA PO) Take 1 tablet by mouth daily.    Marland Kitchen omeprazole (PRILOSEC) 20 MG capsule Take 20 mg by mouth daily.    . polyethylene glycol (MIRALAX / GLYCOLAX) packet Take 17 g by mouth daily. Taken  Daily Monday to Saturday.    .  temazepam (RESTORIL) 15 MG capsule Take 15 mg by mouth at bedtime as needed.      No current facility-administered medications for this visit.      Past Medical History:  Diagnosis Date  . Aortic stenosis    Echo 02/2011 showing mild AS with normal LV systolic function  . Arthritis    "lower back" (10/09/2015)  . Atrial flutter with rapid ventricular response ([place]) 08/19/2016   s/p successful DCCV on 08/20/16, continue eliquis  . Chronic bronchitis ([place])    "off and on; several years" (10/09/2015)  . Chronic diastolic CHF (congestive heart failure) ([place])    a. 12/2015 Echo: EF 55-60%, mild AI/MR, mildly dil LA.  Marland Kitchen Chronic lower back pain   . Complication of anesthesia    "they had trouble waking me up after colon resection"  . DDD (degenerative disc disease), lumbar    severe facet dz and adv DDD MRI L spine 2009  . Diverticulosis   . GERD (gastroesophageal reflux disease)   . Hiatal hernia    hx  . Hypercholesterolemia   . Hypertension   . Insomnia   . OA (osteoarthritis) of knee   . Paroxysmal atrial flutter ([place])    a. 09/2015 s/p TEE/DCCV;  b. CHA2DS2VASc = 5-->Eliquis.  . Peripheral edema   . PVCs (premature ventricular contractions)   . Sacral fracture ([place]) 11/06/2012  . TIA (transient ischemic attack)    pt denies this hx on 10/09/2015 "it was  the ambien I was taking; they had thought I was having a stroke"    Past Surgical History:  Procedure Laterality Date  . BREAST BIOPSY Left   . CARDIAC CATHETERIZATION  02/17/2001   MILD REGURGITATION. EF 60%  . CARDIOVERSION N/A 10/08/2015   Procedure: CARDIOVERSION;  Surgeon: Sanda Klein, MD;  Location: [facility] ENDOSCOPY;  Service: Cardiovascular;  Laterality: N/A;  . CARDIOVERSION N/A 08/20/2016   Procedure: Cardioversion;  Surgeon: Evans Lance, MD;  Location: [place] CV LAB;  Service: Cardiovascular;  Laterality: N/A;  . CATARACT EXTRACTION W/ INTRAOCULAR LENS  IMPLANT, BILATERAL Bilateral   . CESAREAN SECTION  1954  .  COLECTOMY    . COLONOSCOPY    . EXCISIONAL HEMORRHOIDECTOMY    . INGUINAL HERNIA REPAIR Bilateral   . KYPHOPLASTY Right 09/11/2012   Procedure: Right Acrylic Sacroplasty;  Surgeon: Kristeen Miss, MD;  Location: [place] NEURO ORS;  Service: Neurosurgery;  Laterality: Right;  Right  Acrylic Sacroplasty  . TEE WITHOUT CARDIOVERSION N/A 10/08/2015   Procedure: TRANSESOPHAGEAL ECHOCARDIOGRAM (TEE);  Surgeon: Sanda Klein, MD;  Location: [facility] ENDOSCOPY;  Service: Cardiovascular;  Laterality: N/A;  . TOTAL KNEE ARTHROPLASTY Right   . UMBILICAL HERNIA REPAIR      Social History   Socioeconomic History  . Marital status: Widowed    Spouse name: Not on file  . Number of children: Not on file  . Years of education: Not on file  . Highest education level: Not on file  Social Needs  . Financial resource strain: Not on file  . Food insecurity - worry: Not on file  . Food insecurity - inability: Not on file  . Transportation needs - medical: Not on file  . Transportation needs - non-medical: Not on file  Occupational History  . Not on file  Tobacco Use  . Smoking status: Never Smoker  . Smokeless tobacco: Never Used  Substance and Sexual Activity  . Alcohol use: No  . Drug use: No  . Sexual activity: No  Other Topics Concern  . Not on file  Social History Narrative   Widowed for 6 years    She has two children ( one local, and one in [place])     Family History  Problem Relation Age of Onset  . Heart disease Mother   . Hypertension Father   . Heart disease Sister   . Hypertension Sister   . Heart attack Neg Hx   . Stroke Neg Hx     ROS: no fevers or chills, productive cough, hemoptysis, dysphasia, odynophagia, melena, hematochezia, dysuria, hematuria, rash, seizure activity, orthopnea, PND, pedal edema, claudication. Remaining systems are negative.  Physical Exam: Well-developed well-nourished in no acute distress.  Skin is warm and dry.  HEENT is normal.  Neck is supple.  Chest is  clear to auscultation with normal expansion.  Cardiovascular exam is reg and bradycardic Abdominal exam nontender or distended. No masses palpated. Extremities show no edema. neuro grossly intact  ECG- marked sinus bradycardia with first-degree AV block. Lateral T-wave inversion. personally reviewed  A/P  1 history of atrial flutter-patient is having occasional palpitations but not clear she is having recurrent atrial flutter. If symptoms worsen could refer for consideration of ablation.Patient is bradycardic. His continue metoprolol. Continue apixaban. Check hemoglobin and renal function.  2 hypertension-blood pressure is borderline. Continue present medications and follow.  3 history of palpitations-felt secondary to PACs and PVCs; also with h/o atrial flutter. Discontinue metoprolol given bradycardia.  4 chronic diastolic congestive heart  failure-continue present dose of diuretic. Check potassium and renal function.  5 hyperlipidemia-continue statin.  6 history of mild aortic stenosis-not evident on most recent echocardiogram.  Kirk Ruths, MD

## 2017-02-03 ENCOUNTER — Encounter: Payer: Self-pay | Admitting: Cardiology

## 2017-02-03 ENCOUNTER — Ambulatory Visit (INDEPENDENT_AMBULATORY_CARE_PROVIDER_SITE_OTHER): Payer: Medicare Other | Admitting: Cardiology

## 2017-02-03 VITALS — BP 152/80 | HR 44 | Ht 62.0 in | Wt 121.8 lb

## 2017-02-03 DIAGNOSIS — E7800 Pure hypercholesterolemia, unspecified: Secondary | ICD-10-CM | POA: Diagnosis not present

## 2017-02-03 DIAGNOSIS — I4892 Unspecified atrial flutter: Secondary | ICD-10-CM

## 2017-02-03 DIAGNOSIS — I10 Essential (primary) hypertension: Secondary | ICD-10-CM

## 2017-02-03 LAB — BASIC METABOLIC PANEL
BUN/Creatinine Ratio: 36 — ABNORMAL HIGH (ref 12–28)
BUN: 27 mg/dL (ref 10–36)
CO2: 25 mmol/L (ref 20–29)
Calcium: 9.9 mg/dL (ref 8.7–10.3)
Chloride: 98 mmol/L (ref 96–106)
Creatinine, Ser: 0.76 mg/dL (ref 0.57–1.00)
GFR calc Af Amer: 80 mL/min/{1.73_m2} (ref >59)
GFR calc non Af Amer: 69 mL/min/{1.73_m2} (ref >59)
Glucose: 84 mg/dL (ref 65–99)
Potassium: 5.2 mmol/L (ref 3.5–5.2)
Sodium: 139 mmol/L (ref 134–144)

## 2017-02-03 LAB — CBC
Hematocrit: 39.1 % (ref 34.0–46.6)
Hemoglobin: 13.3 g/dL (ref 11.1–15.9)
MCH: 30.6 pg (ref 26.6–33.0)
MCHC: 34 g/dL (ref 31.5–35.7)
MCV: 90 fL (ref 79–97)
Platelets: 205 10*3/uL (ref 150–379)
RBC: 4.34 x10E6/uL (ref 3.77–5.28)
RDW: 13.6 % (ref 12.3–15.4)
WBC: 6.9 10*3/uL (ref 3.4–10.8)

## 2017-02-03 NOTE — Patient Instructions (Signed)
Medication Instructions:   STOP METOPROLOL  Labwork:  Your physician recommends that you HAVE LAB WORK TODAY  Follow-Up:  Your physician recommends that you schedule a follow-up appointment in: [place]   If you need a refill on your cardiac medications before your next appointment, please call your pharmacy.

## 2017-02-04 ENCOUNTER — Encounter: Payer: Self-pay | Admitting: *Deleted

## 2017-02-11 ENCOUNTER — Emergency Department (HOSPITAL_COMMUNITY)
Admission: EM | Admit: 2017-02-11 | Discharge: 2017-02-11 | Disposition: A | Discharge status: Home / Self Care | Payer: Medicare Other | Attending: Emergency Medicine | Admitting: Emergency Medicine

## 2017-02-11 ENCOUNTER — Emergency Department (HOSPITAL_COMMUNITY): Payer: Medicare Other

## 2017-02-11 ENCOUNTER — Encounter (HOSPITAL_COMMUNITY): Payer: Self-pay | Admitting: Emergency Medicine

## 2017-02-11 ENCOUNTER — Other Ambulatory Visit: Payer: Self-pay

## 2017-02-11 DIAGNOSIS — Z79899 Other long term (current) drug therapy: Secondary | ICD-10-CM | POA: Insufficient documentation

## 2017-02-11 DIAGNOSIS — I110 Hypertensive heart disease with heart failure: Secondary | ICD-10-CM | POA: Diagnosis not present

## 2017-02-11 DIAGNOSIS — Z7901 Long term (current) use of anticoagulants: Secondary | ICD-10-CM | POA: Diagnosis not present

## 2017-02-11 DIAGNOSIS — Z8673 Personal history of transient ischemic attack (TIA), and cerebral infarction without residual deficits: Secondary | ICD-10-CM | POA: Diagnosis not present

## 2017-02-11 DIAGNOSIS — I5032 Chronic diastolic (congestive) heart failure: Secondary | ICD-10-CM | POA: Diagnosis not present

## 2017-02-11 DIAGNOSIS — R531 Weakness: Secondary | ICD-10-CM | POA: Diagnosis not present

## 2017-02-11 DIAGNOSIS — Z96651 Presence of right artificial knee joint: Secondary | ICD-10-CM | POA: Diagnosis not present

## 2017-02-11 DIAGNOSIS — I10 Essential (primary) hypertension: Secondary | ICD-10-CM

## 2017-02-11 LAB — BASIC METABOLIC PANEL
Anion gap: 10 (ref 5–15)
BUN: 28 mg/dL — ABNORMAL HIGH (ref 6–20)
CO2: 25 mmol/L (ref 22–32)
Calcium: 9.2 mg/dL (ref 8.9–10.3)
Chloride: 99 mmol/L — ABNORMAL LOW (ref 101–111)
Creatinine, Ser: 0.86 mg/dL (ref 0.44–1.00)
GFR calc Af Amer: >60 mL/min (ref >60)
GFR calc non Af Amer: 58 mL/min — ABNORMAL LOW (ref >60)
Glucose, Bld: 104 mg/dL — ABNORMAL HIGH (ref 65–99)
Potassium: 5 mmol/L (ref 3.5–5.1)
Sodium: 134 mmol/L — ABNORMAL LOW (ref 135–145)

## 2017-02-11 LAB — URINALYSIS, ROUTINE W REFLEX MICROSCOPIC
Bilirubin Urine: NEGATIVE
Glucose, UA: NEGATIVE mg/dL
Hgb urine dipstick: NEGATIVE
Ketones, ur: NEGATIVE mg/dL
Nitrite: NEGATIVE
Protein, ur: NEGATIVE mg/dL
Specific Gravity, Urine: 1.012 (ref 1.005–1.030)
Squamous Epithelial / LPF: NONE SEEN
pH: 6 (ref 5.0–8.0)

## 2017-02-11 LAB — CBC
HCT: 39.8 % (ref 36.0–46.0)
Hemoglobin: 13.3 g/dL (ref 12.0–15.0)
MCH: 30.3 pg (ref 26.0–34.0)
MCHC: 33.4 g/dL (ref 30.0–36.0)
MCV: 90.7 fL (ref 78.0–100.0)
Platelets: 186 10*3/uL (ref 150–400)
RBC: 4.39 MIL/uL (ref 3.87–5.11)
RDW: 13.1 % (ref 11.5–15.5)
WBC: 5.9 10*3/uL (ref 4.0–10.5)

## 2017-02-11 LAB — I-STAT TROPONIN, ED: Troponin i, poc: 0 ng/mL (ref 0.00–0.08)

## 2017-02-11 NOTE — ED Provider Notes (Signed)
[facility] EMERGENCY DEPARTMENT Provider Note   CSN: [id number] Arrival date & time: 02/11/17  2043     History   Chief Complaint Chief Complaint  Patient presents with  . Hypertension  . Weakness    HPI Glenda [name] is a 83 y.o. female.  83 yo F with a chief complaint of hypertension.  The patient has been feeling generally weak for the past couple days.  She denies chest pain or shortness of breath.  Denies nausea vomiting or diarrhea.  Denies decreased oral intake.  Denies dysuria increased frequency or flank pain.  She denies headache or neck pain.  Denies change in her vision.  Denies unilateral numbness or weakness.  The patient has her blood pressure checked twice a day and had her pressure checked this evening and it was elevated.  When they rechecked it it continued to go up and so they continue the check it.  Once it reached 818 systolically then called 299.  The patient denies any change in her symptoms.   The history is provided by the patient.  Hypertension  This is a new problem. The current episode started 12 to 24 hours ago. The problem occurs constantly. The problem has not changed since onset.Pertinent negatives include no chest pain, no headaches and no shortness of breath. Nothing aggravates the symptoms. Nothing relieves the symptoms. She has tried nothing for the symptoms. The treatment provided no relief.  Weakness  Primary symptoms include no dizziness. Pertinent negatives include no shortness of breath, no chest pain, no vomiting and no headaches.    Past Medical History:  Diagnosis Date  . Aortic stenosis    Echo 02/2011 showing mild AS with normal LV systolic function  . Arthritis    "lower back" (10/09/2015)  . Atrial flutter with rapid ventricular response ([place]) 08/19/2016   s/p successful DCCV on 08/20/16, continue eliquis  . Chronic bronchitis ([place])    "off and on; several years" (10/09/2015)  . Chronic diastolic CHF  (congestive heart failure) ([place])    a. 12/2015 Echo: EF 55-60%, mild AI/MR, mildly dil LA.  Marland Kitchen Chronic lower back pain   . Complication of anesthesia    "they had trouble waking me up after colon resection"  . DDD (degenerative disc disease), lumbar    severe facet dz and adv DDD MRI L spine 2009  . Diverticulosis   . GERD (gastroesophageal reflux disease)   . Hiatal hernia    hx  . Hypercholesterolemia   . Hypertension   . Insomnia   . OA (osteoarthritis) of knee   . Paroxysmal atrial flutter ([place])    a. 09/2015 s/p TEE/DCCV;  b. CHA2DS2VASc = 5-->Eliquis.  . Peripheral edema   . PVCs (premature ventricular contractions)   . Sacral fracture ([place]) 11/06/2012  . TIA (transient ischemic attack)    pt denies this hx on 10/09/2015 "it was the [place] I was taking; they had thought I was having a stroke"    Patient Active Problem List   Diagnosis Date Noted  . History of cardioversion 08/20/2016  . Typical atrial flutter ([place]) 08/19/2016  . Atrial flutter with rapid ventricular response ([place]) 08/19/2016  . Chronic anticoagulation-Eliquis 02/04/2016  . Diastolic CHF, acute on chronic (HCC) 10/07/2015  . Exertional dyspnea 10/06/2015  . Paroxysmal ventricular tachycardia ([place])   . Atrial flutter ([place])   . Malaise and fatigue 12/20/2014  . Shortness of breath 12/13/2014  . Palpitations 12/13/2014  . Vertigo 11/19/2012  . Unspecified  constipation 11/06/2012  . Accelerated/malignant hypertension 09/14/2012  . Peripheral edema 07/11/2012  . Aortic stenosis, mild 07/11/2012  . Chronic diastolic HF (heart failure) ([place]) 07/11/2012  . Osteoarthritis 04/03/2012  . Anemia 06/10/2011  . PVC (premature ventricular contraction) 02/16/2011  . Dizziness 12/01/2010  . Insomnia 09/15/2010  . Hypercholesterolemia 09/15/2010    Past Surgical History:  Procedure Laterality Date  . BREAST BIOPSY Left   . CARDIAC CATHETERIZATION  02/17/2001   MILD REGURGITATION. EF 60%  . CARDIOVERSION N/A  10/08/2015   Procedure: CARDIOVERSION;  Surgeon: Sanda Klein, MD;  Location: [facility];  Service: Cardiovascular;  Laterality: N/A;  . CARDIOVERSION N/A 08/20/2016   Procedure: Cardioversion;  Surgeon: Evans Lance, MD;  Location: [facility] LAB;  Service: Cardiovascular;  Laterality: N/A;  . CATARACT EXTRACTION W/ INTRAOCULAR LENS  IMPLANT, BILATERAL Bilateral   . CESAREAN SECTION  1954  . COLECTOMY    . COLONOSCOPY    . EXCISIONAL HEMORRHOIDECTOMY    . INGUINAL HERNIA REPAIR Bilateral   . KYPHOPLASTY Right 09/11/2012   Procedure: Right Acrylic Sacroplasty;  Surgeon: Kristeen Miss, MD;  Location: [facility];  Service: Neurosurgery;  Laterality: Right;  Right  Acrylic Sacroplasty  . TEE WITHOUT CARDIOVERSION N/A 10/08/2015   Procedure: TRANSESOPHAGEAL ECHOCARDIOGRAM (TEE);  Surgeon: Sanda Klein, MD;  Location: [facility];  Service: Cardiovascular;  Laterality: N/A;  . TOTAL KNEE ARTHROPLASTY Right   . UMBILICAL HERNIA REPAIR      OB History    No data available       Home Medications    Prior to Admission medications   Medication Sig Start Date End Date Taking? Authorizing Provider  amLODipine (NORVASC) 5 MG tablet Take 0.5 tablets (2.5 mg total) by mouth daily. 08/26/16   Baldwin Jamaica, PA-C  apixaban (ELIQUIS) 2.5 MG TABS tablet Take 1 tablet (2.5 mg total) by mouth 2 (two) times daily. 01/23/16   Lelon Perla, MD  Cranberry 425 MG CAPS Take 425 mg by mouth 2 (two) times daily.    [provider]  docusate sodium (COLACE) 100 MG capsule Take 100 mg by mouth 2 (two) times daily.    [provider]  furosemide (LASIX) 20 MG tablet Take 1.5 tablets (30 mg total) by mouth daily. 08/26/16   Baldwin Jamaica, PA-C  HYDROcodone-acetaminophen (NORCO) 7.5-325 MG tablet Take 7.5-325 tablets by mouth 3 (three) times daily. 08/10/16   [provider]  losartan (COZAAR) 50 MG tablet Take 1 tablet (50 mg total) by mouth daily. 03/08/16   Almyra Deforest,  PA  Multiple Vitamins-Minerals (CEROVITE ADVANCED FORMULA PO) Take 1 tablet by mouth daily.    [provider]  omeprazole (PRILOSEC) 20 MG capsule Take 20 mg by mouth daily. 08/12/16   [provider]  polyethylene glycol (MIRALAX / GLYCOLAX) packet Take 17 g by mouth daily. Taken  Daily Monday to Saturday.    [provider]  temazepam (RESTORIL) 15 MG capsule Take 15 mg by mouth at bedtime as needed.     [provider]    Family History Family History  Problem Relation Age of Onset  . Heart disease Mother   . Hypertension Father   . Heart disease Sister   . Hypertension Sister   . Heart attack Neg Hx   . Stroke Neg Hx     Social History Social History   Tobacco Use  . Smoking status: Never Smoker  . Smokeless tobacco: Never Used  Substance Use Topics  .  Alcohol use: No  . Drug use: No     Allergies   Ace inhibitors; Amitriptyline; Avelox [moxifloxacin hcl in nacl]; Halcion [triazolam]; Pentazocine lactate; Sulfa drugs cross reactors; Trazodone and nefazodone; and Penicillins   Review of Systems Review of Systems  Constitutional: Negative for chills and fever.  HENT: Negative for congestion and rhinorrhea.   Eyes: Negative for redness and visual disturbance.  Respiratory: Negative for shortness of breath and wheezing.   Cardiovascular: Negative for chest pain and palpitations.  Gastrointestinal: Negative for nausea and vomiting.  Genitourinary: Negative for dysuria and urgency.  Musculoskeletal: Negative for arthralgias and myalgias.  Skin: Negative for pallor and wound.  Neurological: Positive for weakness (generalized). Negative for dizziness and headaches.     Physical Exam Updated Vital Signs BP (!) 191/80   Pulse 62   Temp 98 F (36.7 C) (Oral)   Resp 16   SpO2 98%   Physical Exam  Constitutional: She is oriented to person, place, and time. She appears well-developed and well-nourished. No distress.  HENT:  Head:  Normocephalic and atraumatic.  Eyes: EOM are normal. Pupils are equal, round, and reactive to light.  Neck: Normal range of motion. Neck supple.  Cardiovascular: Normal rate and regular rhythm. Exam reveals no gallop and no friction rub.  No murmur heard. Pulmonary/Chest: Effort normal. She has no wheezes. She has no rales.  Abdominal: Soft. She exhibits no distension and no mass. There is no tenderness. There is no guarding.  Musculoskeletal: She exhibits no edema or tenderness.  Neurological: She is alert and oriented to person, place, and time. She has normal strength. No cranial nerve deficit or sensory deficit. She displays a negative Romberg sign. Coordination and gait normal. She displays no Babinski's sign on the right side. She displays no Babinski's sign on the left side.  Skin: Skin is warm and dry. She is not diaphoretic.  Psychiatric: She has a normal mood and affect. Her behavior is normal.  Nursing note and vitals reviewed.    ED Treatments / Results  Labs (all labs ordered are listed, but only abnormal results are displayed) Labs Reviewed  BASIC METABOLIC PANEL - Abnormal; Notable for the following components:      Result Value   Sodium 134 (*)    Chloride 99 (*)    Glucose, Bld 104 (*)    BUN 28 (*)    GFR calc non Af Amer 58 (*)    All other components within normal limits  URINALYSIS, ROUTINE W REFLEX MICROSCOPIC - Abnormal; Notable for the following components:   Color, Urine STRAW (*)    Leukocytes, UA MODERATE (*)    Bacteria, UA RARE (*)    All other components within normal limits  CBC  I-STAT TROPONIN, ED    EKG  EKG Interpretation  Date/Time:  Friday February 11 2017 20:46:56 EST Ventricular Rate:  63 PR Interval:  228 QRS Duration: 80 QT Interval:  452 QTC Calculation: 462 R Axis:   -49 Text Interpretation:  Sinus rhythm with 1st degree A-V block Left anterior fascicular block Abnormal ECG No significant change since last tracing Confirmed by  Deno Etienne [phone]) on 02/11/2017 8:53:12 PM       Radiology Dg Chest 2 View  Result Date: 02/11/2017 CLINICAL DATA:  Chills, bilateral leg weakness, and dizziness today. Hypertension. EXAM: CHEST  2 VIEW COMPARISON:  08/19/2016 FINDINGS: Cardiac enlargement. No vascular congestion or edema. No focal consolidation. No blunting of costophrenic angles. No pneumothorax. Mediastinal contours appear  intact. Calcification of the aorta. Similar appearance to previous study. IMPRESSION: Cardiac enlargement. No evidence of active pulmonary disease. Aortic atherosclerosis. Electronically Signed   By: Lucienne Capers M.D.   On: 02/11/2017 22:26    Procedures Procedures (including critical care time)  Medications Ordered in ED Medications - No data to display   Initial Impression / Assessment and Plan / ED Course  I have reviewed the triage vital signs and the nursing notes.  Pertinent labs & imaging results that were available during my care of the patient were reviewed by me and considered in my medical decision making (see chart for details).     83 yo F with a chief complaint of hypertension.  The patient had initially an elevated level of 387 systolic document was checked over and over again until it was over 200.  When she arrived in the ED her blood pressure was somewhat improved.    She has no chest pain or shortness of breath.  No asymmetric pulse.  No rales.  No lower extremity edema.  Benign neuro exam.  With the patient feeling generally unwell laboratory evaluation was performed.  This is reassuring.  She has a negative troponin EKG is unremarkable.  Chest x-ray negative for pneumonia.  Not anemic.  Her blood pressure is normally controlled by Dr. Stanford Breed.  Suggested that she call him and schedule an appointment.  Patient asymptomatic with no noted s/s of end organ damage.  No chest pain, diaphoresis, nausea or other acs symptoms.  No headache or neurologic complaints,no unequal  pulses, normal pulse ox without rales or sob.  Feel this is unlikely to be a Hypertensive Emergency and recent studies suggest no benefit for inpatient admission.  There are also no studies to my knowledge suggesting that patients with hypertensive urgency have increased risk for end organ disease.The patient will follow up closely with their PCP.  Compliance with their medication stressed.    Lowell Guitar, Cicero Duck EH, et al. Characteristics and outcomes of patients presenting with hypertensive urgency in the office setting. JAMA Intern Med. 2016 Jul 1; 176(7): 981-8.     11:02 PM:  I have discussed the diagnosis/risks/treatment options with the patient and believe the pt to be eligible for discharge home to follow-up with PCP. We also discussed returning to the ED immediately if new or worsening sx occur. We discussed the sx which are most concerning (e.g., chest pain, sob, stroke s/sx) that necessitate immediate return. Medications administered to the patient during their visit and any new prescriptions provided to the patient are listed below.  Medications given during this visit Medications - No data to display   The patient appears reasonably screen and/or stabilized for discharge and I doubt any other medical condition or other [facility] requiring further screening, evaluation, or treatment in the ED at this time prior to discharge.   Final Clinical Impressions(s) / ED Diagnoses   Final diagnoses:  Weakness  Essential hypertension    ED Discharge Orders    None       Deno Etienne, DO 02/11/17 2303

## 2017-02-11 NOTE — ED Notes (Signed)
RN gave report to nurse at [facility]

## 2017-02-11 NOTE — ED Notes (Signed)
Pt in xray

## 2017-02-11 NOTE — ED Triage Notes (Signed)
Report received from [facility]> Pt from [facility].  C/o chills, bilateral leg weakness, and dizziness when walking today.  Reports hypertension since around 7pm.  Metoprolol Rx was changed from 25mg  to 12.5mg  on 11/15.  Pt denies pain.

## 2017-02-11 NOTE — ED Notes (Signed)
Patient transported to X-ray 

## 2017-02-11 NOTE — ED Notes (Signed)
ED Provider at bedside. 

## 2017-02-12 IMAGING — DX DG CHEST 2V
2 series · 2 of 2 positions shown · non-contrast
Comparison: 03/01/2012

CLINICAL DATA: One week history of dyspnea.

EXAM:
CHEST  2 VIEW

[w chest pa]
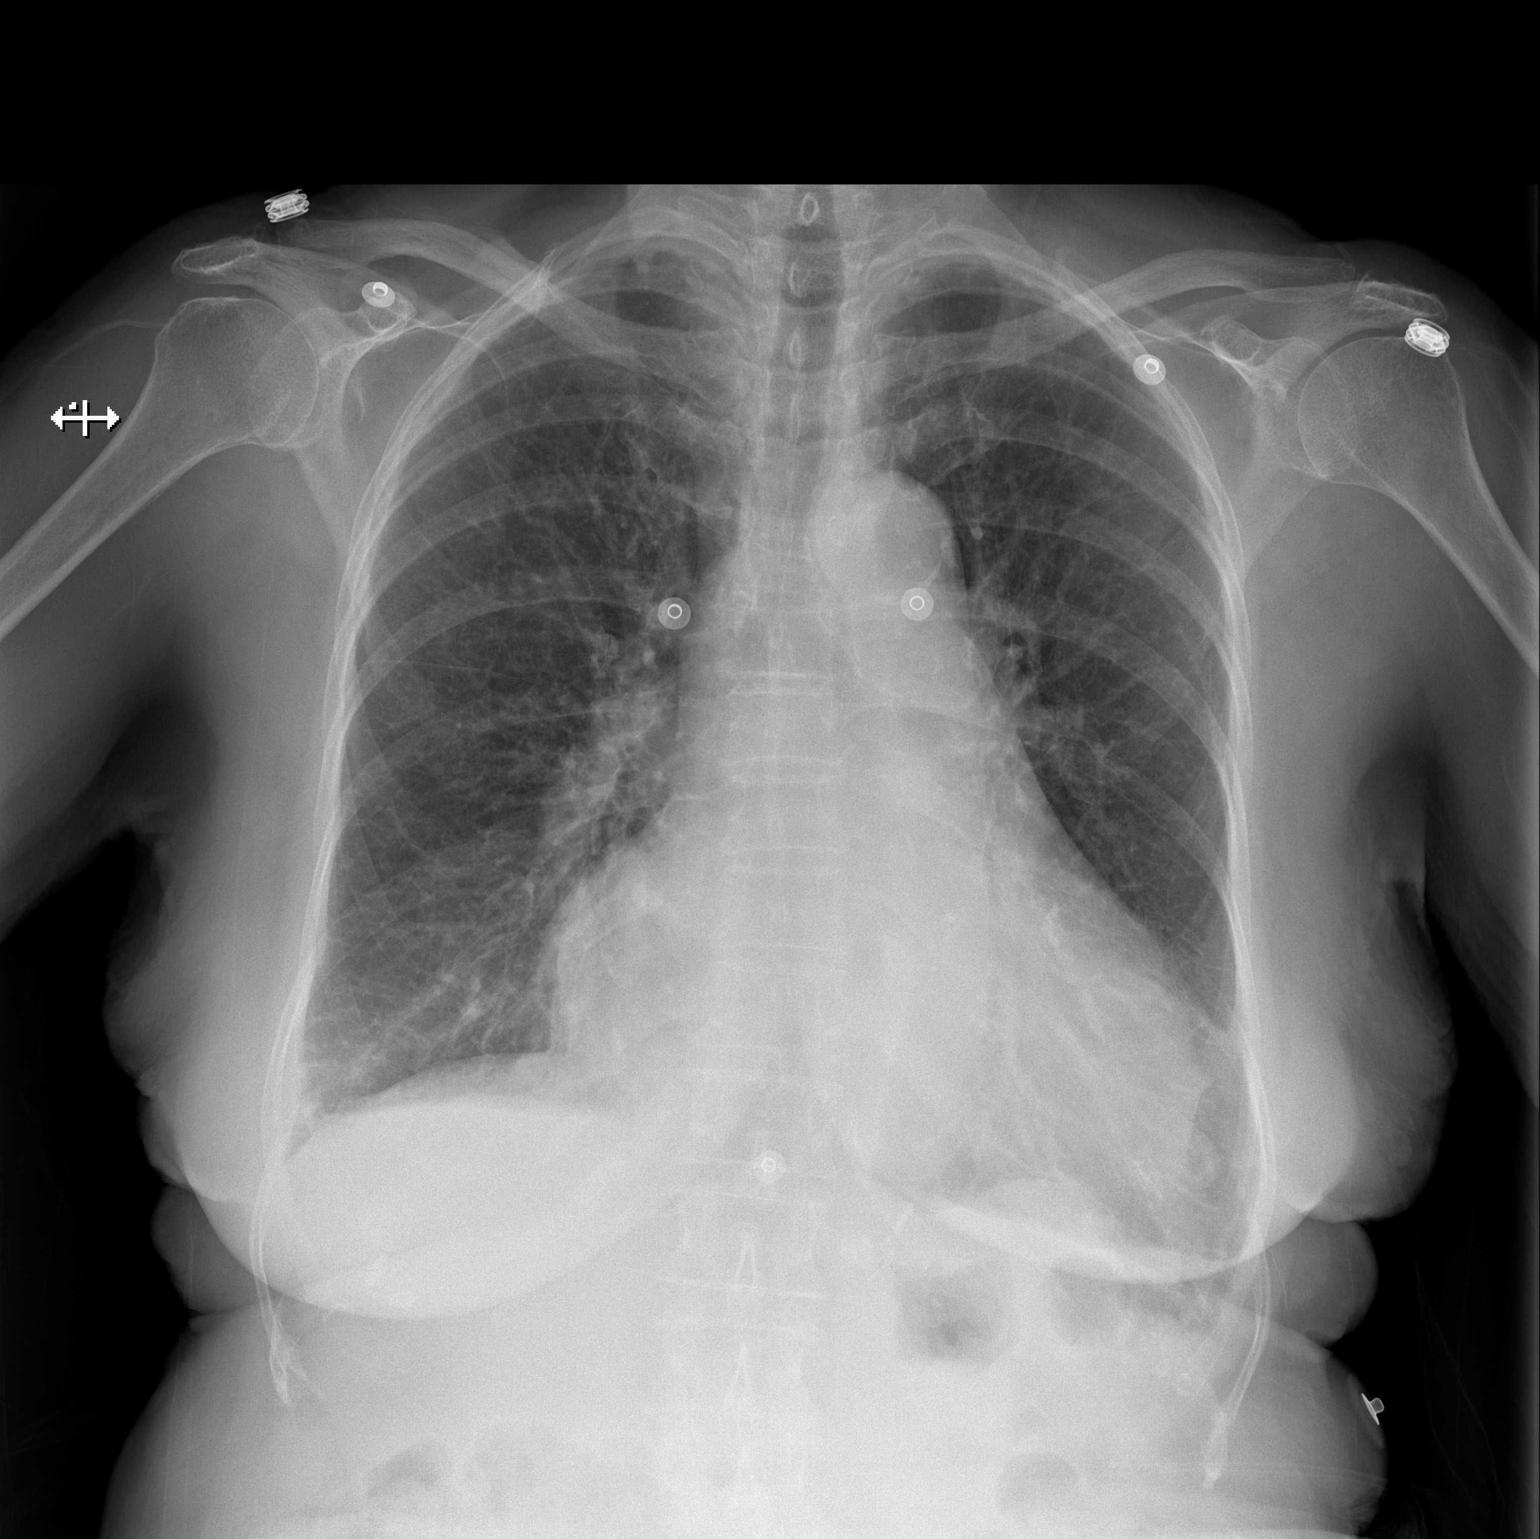

[w chest lat]
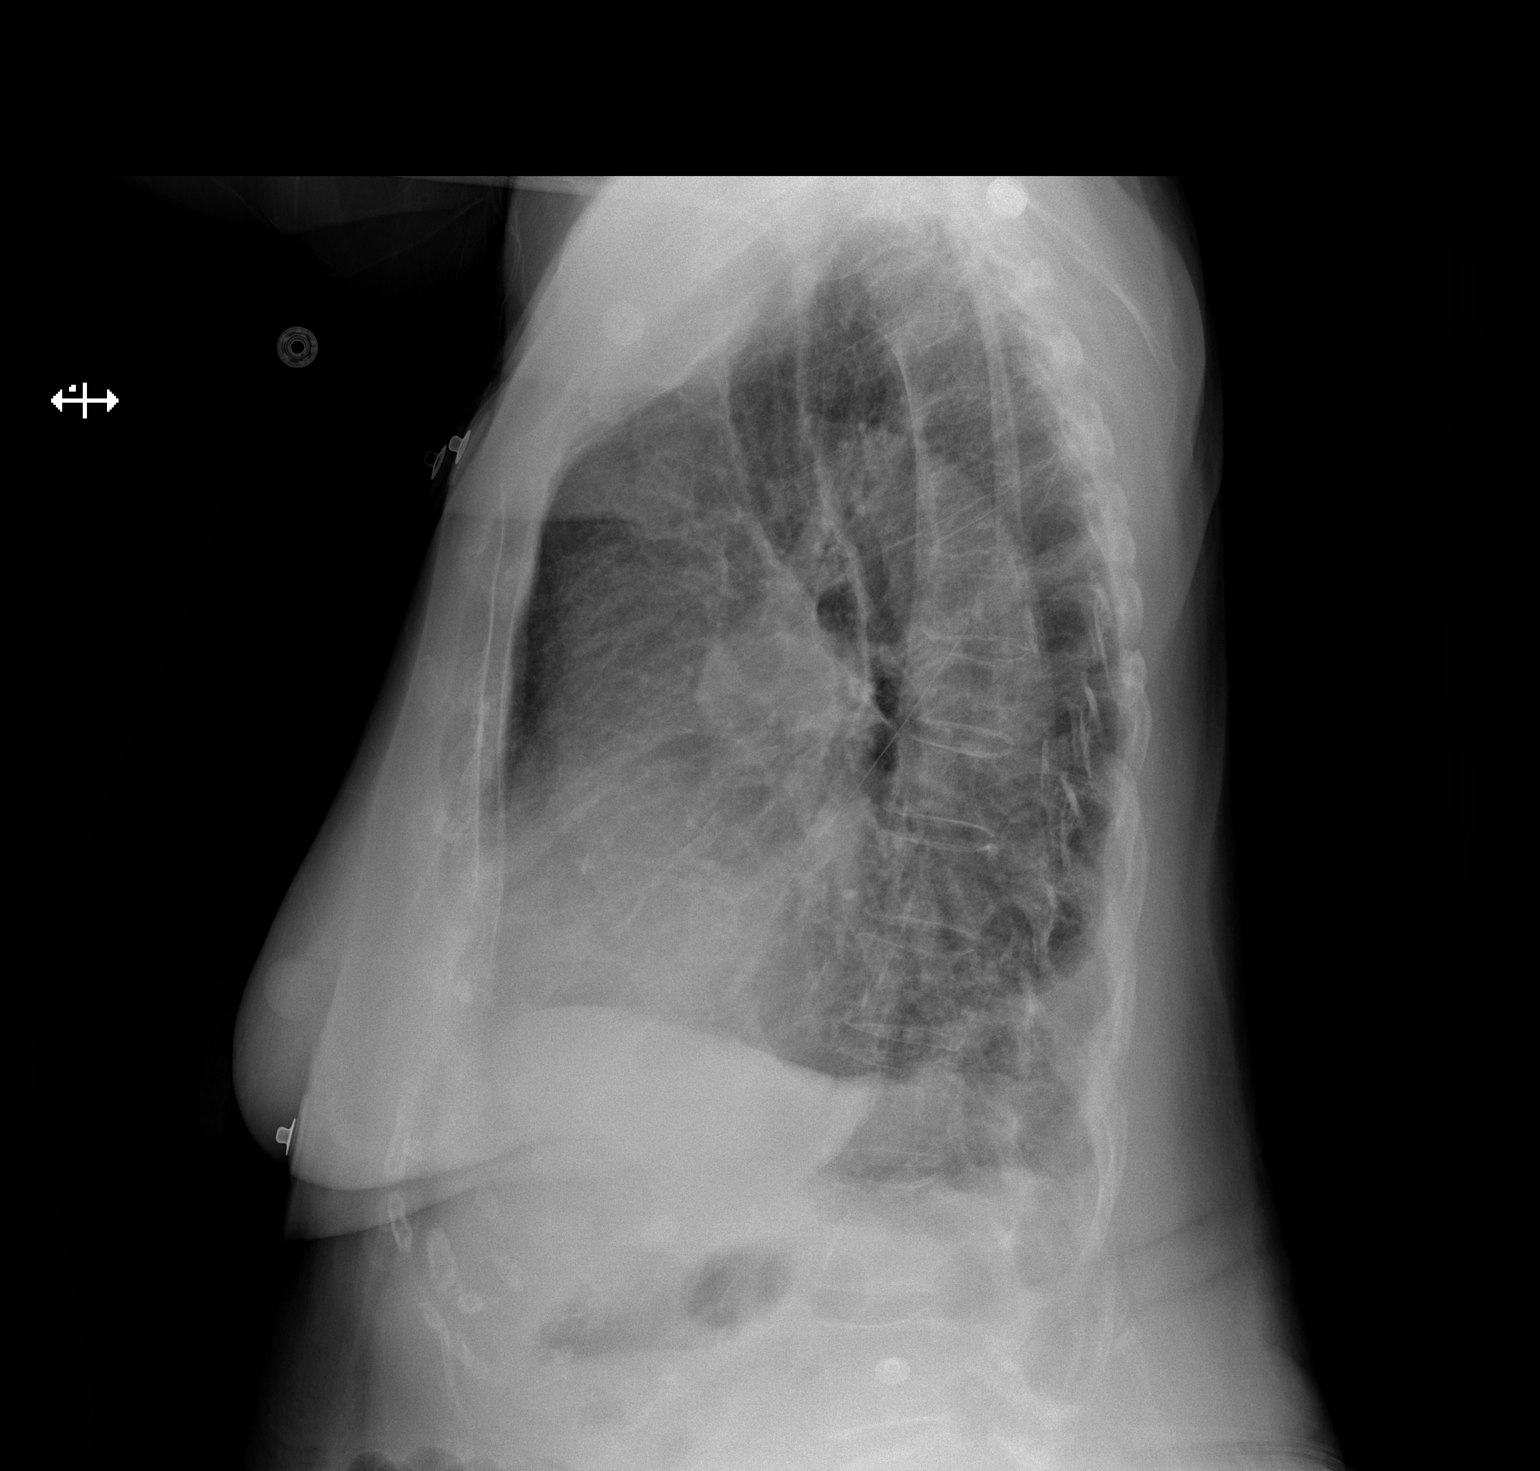

[2 of 2 positions shown; findings below may reference images not displayed]

FINDINGS: The heart is enlarged. There is tortuosity, ectasia and
calcification of the thoracic aorta. The pulmonary hila appear
normal. There are emphysematous and chronic bronchitic type lung
changes but no definite acute overlying pulmonary process. No
definite edema, infiltrates or effusions. The bony thorax is intact.
IMPRESSION: Cardiac enlargement and chronic lung changes but no definite acute
overlying pulmonary process.

## 2017-04-27 ENCOUNTER — Telehealth: Payer: Self-pay | Admitting: Adult Health

## 2017-04-27 NOTE — Telephone Encounter (Signed)
Ok with me 

## 2017-04-27 NOTE — Telephone Encounter (Signed)
Patient would like to transfer care from Dorothyann Peng, NP to Dr. Yong Channel. Please advise.   Copied from [place] [id number]. Topic: Appointment Scheduling - Scheduling Inquiry for Clinic >> Apr 27, 2017  2:56 PM Hewitt Shorts wrote: Pt is wanting to establish care from [place] to Dr. Yong Channel due to where she lives better location and is a family member of Reola Mosher and wife lori   Best number [phone]

## 2017-04-28 NOTE — Telephone Encounter (Signed)
I called the patient and LVM to establish care.

## 2017-04-28 NOTE — Telephone Encounter (Signed)
[place] with me- I am accepting referrals for patients with medicare. She will need 30 minute establish appointment- that may be 3-4 months out with my upcoming time off so just please make her aware of that

## 2017-05-19 ENCOUNTER — Other Ambulatory Visit: Payer: Self-pay | Admitting: Family Medicine

## 2017-05-19 ENCOUNTER — Telehealth: Payer: Self-pay | Admitting: Adult Health

## 2017-05-19 DIAGNOSIS — Z1231 Encounter for screening mammogram for malignant neoplasm of breast: Secondary | ICD-10-CM

## 2017-05-19 NOTE — Telephone Encounter (Signed)
Copied from [place] [id number]. Topic: Quick Communication - See Telephone Encounter >> May 19, 2017 11:09 AM Synthia Innocent wrote: CRM for notification. See Telephone encounter for:  Pt is scheduled to see Dr Yong Channel in April, previous patient of [facility]. Patient has found a lump in left breast. She called [facility] for [facility] diagnostic mammogram, they will need order. Able to order. Please advise 05/19/17.

## 2017-05-19 NOTE — Telephone Encounter (Signed)
Please advise as patient is not [facility] patient until April and Dr. Yong Channel is out of the office until 3/25.

## 2017-05-19 NOTE — Telephone Encounter (Signed)
I left a voice message for patient to call back and schedule a office visit with [facility] for evaluation.

## 2017-05-24 ENCOUNTER — Ambulatory Visit (INDEPENDENT_AMBULATORY_CARE_PROVIDER_SITE_OTHER): Payer: Medicare Other | Admitting: Adult Health

## 2017-05-24 ENCOUNTER — Encounter: Payer: Self-pay | Admitting: Adult Health

## 2017-05-24 VITALS — BP 160/60 | HR 59 | Temp 97.7°F | Wt 120.6 lb

## 2017-05-24 DIAGNOSIS — N6320 Unspecified lump in the left breast, unspecified quadrant: Secondary | ICD-10-CM

## 2017-05-24 NOTE — Patient Instructions (Signed)
It was great seeing you today   I have placed an order at the [facility] for you to have a diagnostic mammogram   If you have not heard from them by the end of the week or early next please, either let me know or you can give them a call   Phone: [phone]

## 2017-05-24 NOTE — Progress Notes (Signed)
Subjective:    Patient ID: Glenda [name], female    DOB: Jan 03, 1925, 84 y.o.   MRN: [id number]  HPI  84 year old female who  has a past medical history of Aortic stenosis, Arthritis, Atrial flutter with rapid ventricular response ([place]) (08/19/2016), Chronic bronchitis (HCC), Chronic diastolic CHF (congestive heart failure) (HCC), Chronic lower back pain, Complication of anesthesia, DDD (degenerative disc disease), lumbar, Diverticulosis, GERD (gastroesophageal reflux disease), Hiatal hernia, Hypercholesterolemia, Hypertension, Insomnia, OA (osteoarthritis) of knee, Paroxysmal atrial flutter ([place]), Peripheral edema, PVCs (premature ventricular contractions), Sacral fracture ([place]) (11/06/2012), and TIA (transient ischemic attack).  She presents to the office today for the concern of lump in left breast. She first noticed this lump about two weeks ago. She reports history of fibrotic breast tissue but this " feels different". She denies slight soreness with palpation.   Denies any dimpling, discharge, or asymmetry  of breasts   Review of Systems See HPI   Past Medical History:  Diagnosis Date  . Aortic stenosis    Echo 02/2011 showing mild AS with normal LV systolic function  . Arthritis    "lower back" (10/09/2015)  . Atrial flutter with rapid ventricular response ([place]) 08/19/2016   s/p successful DCCV on 08/20/16, continue eliquis  . Chronic bronchitis ([place])    "off and on; several years" (10/09/2015)  . Chronic diastolic CHF (congestive heart failure) ([place])    a. 12/2015 Echo: EF 55-60%, mild AI/MR, mildly dil LA.  Marland Kitchen Chronic lower back pain   . Complication of anesthesia    "they had trouble waking me up after colon resection"  . DDD (degenerative disc disease), lumbar    severe facet dz and adv DDD MRI L spine 2009  . Diverticulosis   . GERD (gastroesophageal reflux disease)   . Hiatal hernia    hx  . Hypercholesterolemia   . Hypertension   . Insomnia   . OA (osteoarthritis)  of knee   . Paroxysmal atrial flutter ([place])    a. 09/2015 s/p TEE/DCCV;  b. CHA2DS2VASc = 5-->Eliquis.  . Peripheral edema   . PVCs (premature ventricular contractions)   . Sacral fracture ([place]) 11/06/2012  . TIA (transient ischemic attack)    pt denies this hx on 10/09/2015 "it was the [place] I was taking; they had thought I was having a stroke"    Social History   Socioeconomic History  . Marital status: Widowed    Spouse name: Not on file  . Number of children: Not on file  . Years of education: Not on file  . Highest education level: Not on file  Social Needs  . Financial resource strain: Not on file  . Food insecurity - worry: Not on file  . Food insecurity - inability: Not on file  . Transportation needs - medical: Not on file  . Transportation needs - non-medical: Not on file  Occupational History  . Not on file  Tobacco Use  . Smoking status: Never Smoker  . Smokeless tobacco: Never Used  Substance and Sexual Activity  . Alcohol use: No  . Drug use: No  . Sexual activity: No  Other Topics Concern  . Not on file  Social History Narrative   Widowed for 6 years    She has two children ( one local, and one in [place])     Past Surgical History:  Procedure Laterality Date  . BREAST BIOPSY Left   . CARDIAC CATHETERIZATION  02/17/2001   MILD REGURGITATION. EF 60%  .  CARDIOVERSION N/A 10/08/2015   Procedure: CARDIOVERSION;  Surgeon: Sanda Klein, MD;  Location: [facility];  Service: Cardiovascular;  Laterality: N/A;  . CARDIOVERSION N/A 08/20/2016   Procedure: Cardioversion;  Surgeon: Evans Lance, MD;  Location: [facility];  Service: Cardiovascular;  Laterality: N/A;  . CATARACT EXTRACTION W/ INTRAOCULAR LENS  IMPLANT, BILATERAL Bilateral   . CESAREAN SECTION  1954  . COLECTOMY    . COLONOSCOPY    . EXCISIONAL HEMORRHOIDECTOMY    . INGUINAL HERNIA REPAIR Bilateral   . KYPHOPLASTY Right 09/11/2012   Procedure: Right Acrylic Sacroplasty;  Surgeon: Kristeen Miss, MD;  Location: [facility];  Service: Neurosurgery;  Laterality: Right;  Right  Acrylic Sacroplasty  . TEE WITHOUT CARDIOVERSION N/A 10/08/2015   Procedure: TRANSESOPHAGEAL ECHOCARDIOGRAM (TEE);  Surgeon: Sanda Klein, MD;  Location: [facility]COPY;  Service: Cardiovascular;  Laterality: N/A;  . TOTAL KNEE ARTHROPLASTY Right   . UMBILICAL HERNIA REPAIR      Family History  Problem Relation Age of Onset  . Heart disease Mother   . Hypertension Father   . Heart disease Sister   . Hypertension Sister   . Heart attack Neg Hx   . Stroke Neg Hx     Allergies  Allergen Reactions  . Ace Inhibitors Other (See Comments)    Cough  . Amitriptyline Other (See Comments)    hyper  . Avelox [Moxifloxacin Hcl In Nacl] Other (See Comments)    dizziness  . Halcion [Triazolam] Other (See Comments)    Doesn't recall  . Pentazocine Lactate Other (See Comments)    Doesn't recall  . Sulfa Drugs Cross Reactors Other (See Comments)    Doesn't recall  . Trazodone And Nefazodone Other (See Comments)    Not effective  . Penicillins Rash    No other information available at this time 10/06/15    Current Outpatient Medications on File Prior to Visit  Medication Sig Dispense Refill  . amLODipine (NORVASC) 5 MG tablet Take 0.5 tablets (2.5 mg total) by mouth daily. 30 tablet 3  . apixaban (ELIQUIS) 2.5 MG TABS tablet Take 1 tablet (2.5 mg total) by mouth 2 (two) times daily. 60 tablet 4  . Cranberry 425 MG CAPS Take 425 mg by mouth 2 (two) times daily.    Marland Kitchen docusate sodium (COLACE) 100 MG capsule Take 100 mg by mouth 2 (two) times daily.    . furosemide (LASIX) 20 MG tablet Take 1.5 tablets (30 mg total) by mouth daily. 45 tablet 3  . HYDROcodone-acetaminophen (NORCO) 7.5-325 MG tablet Take 7.5-325 tablets by mouth 3 (three) times daily.    Marland Kitchen losartan (COZAAR) 50 MG tablet Take 1 tablet (50 mg total) by mouth daily. 30 tablet 11  . Multiple Vitamins-Minerals (CEROVITE ADVANCED FORMULA PO) Take 1  tablet by mouth daily.    Marland Kitchen omeprazole (PRILOSEC) 20 MG capsule Take 20 mg by mouth daily.    . polyethylene glycol (MIRALAX / GLYCOLAX) packet Take 17 g by mouth daily. Taken  Daily Monday to Saturday.    . temazepam (RESTORIL) 15 MG capsule Take 15 mg by mouth at bedtime as needed.      No current facility-administered medications on file prior to visit.     BP (!) 160/60 (BP Location: Right Arm, Patient Position: Sitting)   Pulse (!) 59   Temp 97.7 F (36.5 C) (Oral)   Wt 120 lb 9.6 oz (54.7 kg)   SpO2 99%   BMI 22.06 kg/m  Objective:   Physical Exam  Constitutional: She is oriented to person, place, and time. She appears well-developed and well-nourished. No distress.  HENT:  Head: Normocephalic and atraumatic.  Right Ear: External ear normal.  Left Ear: External ear normal.  Nose: Nose normal.  Mouth/Throat: Oropharynx is clear and moist. No oropharyngeal exudate.  Cardiovascular: Normal rate, regular rhythm, normal heart sounds and intact distal pulses. Exam reveals no gallop and no friction rub.  No murmur heard. Pulmonary/Chest: Effort normal and breath sounds normal. No respiratory distress. She has no wheezes. She has no rales. She exhibits mass. She exhibits no tenderness. Right breast exhibits tenderness. Right breast exhibits no inverted nipple, no mass, no nipple discharge and no skin change. Left breast exhibits mass and tenderness. Left breast exhibits no nipple discharge and no skin change. Breasts are symmetrical.    She does have fibrotic breast tissue throughout.   Dime sized mass felt at the 2-3 o'clock position on left breast   Neurological: She is alert and oriented to person, place, and time.  Skin: Skin is warm and dry. No rash noted. She is not diaphoretic. No erythema. No pallor.  Psychiatric: She has a normal mood and affect. Her behavior is normal. Judgment and thought content normal.  Nursing note and vitals reviewed.     Assessment & Plan:   1. Left breast lump - She would like to have a mammogram done and possible biopsy. She does not want to do chemo, radiation, or mastectomy if she does have breast cancer.   - MM Digital Diagnostic Unilat L; Future   Dorothyann Peng, NP

## 2017-05-26 ENCOUNTER — Other Ambulatory Visit: Payer: Self-pay | Admitting: Adult Health

## 2017-05-26 DIAGNOSIS — N6320 Unspecified lump in the left breast, unspecified quadrant: Secondary | ICD-10-CM

## 2017-05-26 NOTE — Progress Notes (Signed)
HPI: FU hypertension and aortic stenosis. History of atypical chest pain and palpitations. She had a exercise treadmill test on 01/26/11 which showed no evidence of ischemia. 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 Admitted July 2017 with atrial flutter vs atrial tachycardia and underwent TEE guided cardioversion. Echocardiogram October 2017 showed normal LV function, mild aortic insufficiency and mitral regurgitation and mild left atrial enlargement.Patient had recurrent atrial flutter May 2018 and had repeat cardioversion. Appointment was scheduled to see Dr. Lovena Le for consideration of ablation but he was not available and seen by [facility]; she was inclined not to pursue procedures. Since last seen,she notes increased palpitations.  Occasional sensation dyspnea.  Continuous chest "ache".  No syncope or bleeding.  Current Outpatient Medications  Medication Sig Dispense Refill  . amLODipine (NORVASC) 5 MG tablet Take 0.5 tablets (2.5 mg total) by mouth daily. 30 tablet 3  . apixaban (ELIQUIS) 2.5 MG TABS tablet Take 1 tablet (2.5 mg total) by mouth 2 (two) times daily. 60 tablet 4  . Cranberry 425 MG CAPS Take 425 mg by mouth 2 (two) times daily.    Marland Kitchen docusate sodium (COLACE) 100 MG capsule Take 100 mg by mouth 2 (two) times daily.    . furosemide (LASIX) 20 MG tablet Take 1.5 tablets (30 mg total) by mouth daily. 45 tablet 3  . HYDROcodone-acetaminophen (NORCO) 7.5-325 MG tablet Take 7.5-325 tablets by mouth 3 (three) times daily.    Marland Kitchen losartan (COZAAR) 50 MG tablet Take 1 tablet (50 mg total) by mouth daily. 30 tablet 11  . Multiple Vitamins-Minerals (CEROVITE ADVANCED FORMULA PO) Take 1 tablet by mouth daily.    Marland Kitchen omeprazole (PRILOSEC) 40 MG capsule Take 40 mg by mouth daily.    . ondansetron (ZOFRAN) 4 MG tablet Take 4 mg by mouth every 6 (six) hours as needed for nausea or vomiting.    . polyethylene glycol (MIRALAX / GLYCOLAX) packet Take 17 g by mouth daily. Taken  Daily Monday to Saturday.    .  temazepam (RESTORIL) 15 MG capsule Take 15 mg by mouth at bedtime as needed.     Marland Kitchen PROAIR HFA 108 (90 Base) MCG/ACT inhaler INHALE 1-2 PUFFS BY MOUTH EVERY 4-6 HOURS AS NEEDED  0   No current facility-administered medications for this visit.      Past Medical History:  Diagnosis Date  . Aortic stenosis    Echo 02/2011 showing mild AS with normal LV systolic function  . Arthritis    "lower back" (10/09/2015)  . Atrial flutter with rapid ventricular response ([place]) 08/19/2016   s/p successful DCCV on 08/20/16, continue eliquis  . Chronic bronchitis ([place])    "off and on; several years" (10/09/2015)  . Chronic diastolic CHF (congestive heart failure) ([place])    a. 12/2015 Echo: EF 55-60%, mild AI/MR, mildly dil LA.  Marland Kitchen Chronic lower back pain   . Complication of anesthesia    "they had trouble waking me up after colon resection"  . DDD (degenerative disc disease), lumbar    severe facet dz and adv DDD MRI L spine 2009  . Diverticulosis   . GERD (gastroesophageal reflux disease)   . Hiatal hernia    hx  . Hypercholesterolemia   . Hypertension   . Insomnia   . OA (osteoarthritis) of knee   . Paroxysmal atrial flutter ([place])    a. 09/2015 s/p TEE/DCCV;  b. CHA2DS2VASc = 5-->Eliquis.  . Peripheral edema   . PVCs (premature ventricular contractions)   .  Sacral fracture ([place]) 11/06/2012  . TIA (transient ischemic attack)    pt denies this hx on 10/09/2015 "it was the [place] I was taking; they had thought I was having a stroke"    Past Surgical History:  Procedure Laterality Date  . BREAST BIOPSY Left   . CARDIAC CATHETERIZATION  02/17/2001   MILD REGURGITATION. EF 60%  . CARDIOVERSION N/A 10/08/2015   Procedure: CARDIOVERSION;  Surgeon: Sanda Klein, MD;  Location: [facility];  Service: Cardiovascular;  Laterality: N/A;  . CARDIOVERSION N/A 08/20/2016   Procedure: Cardioversion;  Surgeon: Evans Lance, MD;  Location: [facility];  Service: Cardiovascular;  Laterality: N/A;  .  CATARACT EXTRACTION W/ INTRAOCULAR LENS  IMPLANT, BILATERAL Bilateral   . CESAREAN SECTION  1954  . COLECTOMY    . COLONOSCOPY    . EXCISIONAL HEMORRHOIDECTOMY    . INGUINAL HERNIA REPAIR Bilateral   . KYPHOPLASTY Right 09/11/2012   Procedure: Right Acrylic Sacroplasty;  Surgeon: Kristeen Miss, MD;  Location: [facility];  Service: Neurosurgery;  Laterality: Right;  Right  Acrylic Sacroplasty  . TEE WITHOUT CARDIOVERSION N/A 10/08/2015   Procedure: TRANSESOPHAGEAL ECHOCARDIOGRAM (TEE);  Surgeon: Sanda Klein, MD;  Location: [facility] ENDOSCOPY;  Service: Cardiovascular;  Laterality: N/A;  . TOTAL KNEE ARTHROPLASTY Right   . UMBILICAL HERNIA REPAIR      Social History   Socioeconomic History  . Marital status: Widowed    Spouse name: Not on file  . Number of children: Not on file  . Years of education: Not on file  . Highest education level: Not on file  Social Needs  . Financial resource strain: Not on file  . Food insecurity - worry: Not on file  . Food insecurity - inability: Not on file  . Transportation needs - medical: Not on file  . Transportation needs - non-medical: Not on file  Occupational History  . Not on file  Tobacco Use  . Smoking status: Never Smoker  . Smokeless tobacco: Never Used  Substance and Sexual Activity  . Alcohol use: No  . Drug use: No  . Sexual activity: No  Other Topics Concern  . Not on file  Social History Narrative   Widowed for 6 years    She has two children ( one local, and one in [place])     Family History  Problem Relation Age of Onset  . Heart disease Mother   . Hypertension Father   . Heart disease Sister   . Hypertension Sister   . Heart attack Neg Hx   . Stroke Neg Hx     ROS: no fevers or chills, productive cough, hemoptysis, dysphasia, odynophagia, melena, hematochezia, dysuria, hematuria, rash, seizure activity, orthopnea, PND, pedal edema, claudication. Remaining systems are negative.  Physical Exam: Well-developed  well-nourished in no acute distress.  Skin is warm and dry.  HEENT is normal.  Neck is supple.  Chest is clear to auscultation with normal expansion.  Cardiovascular exam is regular rate and rhythm.  Abdominal exam nontender or distended. No masses palpated. Extremities show no edema. neuro grossly intact  ECG-sinus bradycardia, first-degree AV block, nonspecific ST changes.  Personally reviewed  A/P  1 history of atrial flutter-plan to continue present management.  Metoprolol discontinued previously because of bradycardia.  Continue apixaban.  2 hypertension-blood pressure is controlled.  Continue present medications.  3 palpitations-felt secondary to PACs and PVCs.  Also with history of atrial flutter.  Metoprolol discontinued previously because of bradycardia.  Her symptoms  are worse.  Unclear to me whether this is premature beats or recurrent atrial flutter.  Will place event monitor.  If atrial flutter noted could consider antiarrhythmic versus ablation.  4 chronic diastolic congestive heart failure-patient is euvolemic.  Continue present dose of diuretic.    5 hyperlipidemia-continue statin.  6 history of mild aortic stenosis-not evident on most recent echocardiogram.  Kirk Ruths, MD

## 2017-05-31 ENCOUNTER — Encounter: Payer: Self-pay | Admitting: Cardiology

## 2017-05-31 ENCOUNTER — Ambulatory Visit
Admission: RE | Admit: 2017-05-31 | Discharge: 2017-05-31 | Disposition: A | Discharge status: Home / Self Care | Payer: Medicare Other | Source: Ambulatory Visit | Attending: Adult Health | Admitting: Adult Health

## 2017-05-31 ENCOUNTER — Other Ambulatory Visit: Payer: Self-pay | Admitting: Adult Health

## 2017-05-31 ENCOUNTER — Ambulatory Visit (INDEPENDENT_AMBULATORY_CARE_PROVIDER_SITE_OTHER): Payer: Medicare Other | Admitting: Cardiology

## 2017-05-31 VITALS — BP 122/66 | HR 62 | Ht 62.0 in | Wt 122.2 lb

## 2017-05-31 DIAGNOSIS — N6320 Unspecified lump in the left breast, unspecified quadrant: Secondary | ICD-10-CM

## 2017-05-31 DIAGNOSIS — E7800 Pure hypercholesterolemia, unspecified: Secondary | ICD-10-CM

## 2017-05-31 DIAGNOSIS — I10 Essential (primary) hypertension: Secondary | ICD-10-CM | POA: Diagnosis not present

## 2017-05-31 DIAGNOSIS — R921 Mammographic calcification found on diagnostic imaging of breast: Secondary | ICD-10-CM

## 2017-05-31 DIAGNOSIS — R002 Palpitations: Secondary | ICD-10-CM

## 2017-05-31 DIAGNOSIS — I493 Ventricular premature depolarization: Secondary | ICD-10-CM

## 2017-05-31 NOTE — Patient Instructions (Signed)
Medication Instructions:  Your physician recommends that you continue on your current medications as directed. Please refer to the Current Medication list given to you today.   Labwork: none  Testing/Procedures: Your physician has recommended that you wear an event monitor. Event monitors are medical devices that record the heart's electrical activity. Doctors most often [name] these monitors to diagnose arrhythmias. Arrhythmias are problems with the speed or rhythm of the heartbeat. The monitor is a small, portable device. You can wear one while you do your normal daily activities. This is usually used to diagnose what is causing palpitations/syncope (passing out).    Follow-Up: Your physician recommends that you schedule a follow-up appointment in: 3 months with Dr. Stanford Breed   Any Other Special Instructions Will Be Listed Below (If Applicable).     If you need a refill on your cardiac medications before your next appointment, please call your pharmacy.

## 2017-06-06 ENCOUNTER — Telehealth: Payer: Self-pay | Admitting: Cardiology

## 2017-06-06 NOTE — Telephone Encounter (Signed)
She needs the followup in July.

## 2017-06-06 NOTE — Telephone Encounter (Signed)
Patient to call back and schedule followup appointment with Dr. Stanford Breed on a Monday, Tuesday or a Thursday.

## 2017-06-07 ENCOUNTER — Ambulatory Visit
Admission: RE | Admit: 2017-06-07 | Discharge: 2017-06-07 | Disposition: A | Discharge status: Home / Self Care | Payer: Medicare Other | Source: Ambulatory Visit | Attending: Adult Health | Admitting: Adult Health

## 2017-06-07 DIAGNOSIS — N6320 Unspecified lump in the left breast, unspecified quadrant: Secondary | ICD-10-CM

## 2017-06-07 DIAGNOSIS — R921 Mammographic calcification found on diagnostic imaging of breast: Secondary | ICD-10-CM

## 2017-06-08 ENCOUNTER — Telehealth: Payer: Self-pay | Admitting: Cardiology

## 2017-06-08 ENCOUNTER — Telehealth: Payer: Self-pay | Admitting: Adult Health

## 2017-06-08 NOTE — Telephone Encounter (Signed)
Spoke to patient about her breast biopsy results. She is going to speak to her son and decide if she would like to pursue further treatment for breast cancer. She will let me know

## 2017-06-08 NOTE — Telephone Encounter (Signed)
Second time calling patient to scheduled 3 month followup with Dr. Stanford Breed.  LVM.

## 2017-06-08 NOTE — Telephone Encounter (Signed)
Called patient and she said she would have to talk to her driver and see when she could schedule the appointment.  Patient is to call back.

## 2017-06-09 ENCOUNTER — Telehealth: Payer: Self-pay | Admitting: Cardiology

## 2017-06-09 NOTE — Telephone Encounter (Signed)
Called patient and LVM to call back to schedule Dr. Jacalyn Lefevre 3 month followup in July.

## 2017-06-09 NOTE — Telephone Encounter (Signed)
Spoke with patient and her son thinks she should proceed with monitor. Patient will just keep monitor appointment as scheduled. Will forward to Dr Stanford Breed so he will be aware of recent diagnosis

## 2017-06-09 NOTE — Telephone Encounter (Signed)
Patient calling, states that she just found out that she has breast cancer. Patient states that she has an appt with the surgeon on next Thursday to find out the next steps for treatment.  Patient would like to know if she should keep the monitor appt for Monday, 06-13-17.

## 2017-06-13 ENCOUNTER — Ambulatory Visit (INDEPENDENT_AMBULATORY_CARE_PROVIDER_SITE_OTHER): Payer: Medicare Other

## 2017-06-13 DIAGNOSIS — R002 Palpitations: Secondary | ICD-10-CM

## 2017-06-17 ENCOUNTER — Other Ambulatory Visit: Payer: Self-pay | Admitting: Cardiology

## 2017-06-20 ENCOUNTER — Telehealth: Payer: Self-pay | Admitting: Hematology and Oncology

## 2017-06-20 ENCOUNTER — Telehealth: Payer: Self-pay | Admitting: *Deleted

## 2017-06-20 ENCOUNTER — Encounter: Payer: Self-pay | Admitting: Hematology and Oncology

## 2017-06-20 NOTE — Telephone Encounter (Signed)
   [facility] Pre-operative Risk Assessment    Request for surgical clearance:  1. What type of surgery is being performed? mastectomy   2. When is this surgery scheduled? TBD-near future   3. What type of clearance is required (medical clearance vs. Pharmacy clearance to hold med vs. Both)? both  4. Are there any medications that need to be held prior to surgery and how long?Eliquis   5. Practice name and name of physician performing surgery? [facility]   6. What is your office phone and fax number? [phone] Attn: [name]   7. Anesthesia type (None, local, MAC, general) ? general   Glenda [name] A Glenda [name] 06/20/2017, 12:31 PM  _________________________________________________________________   (provider comments below)

## 2017-06-20 NOTE — Telephone Encounter (Signed)
Appt has been scheduled for the pt to see Dr. Lindi Adie on 4/8 at 345pm. Pt aware to arrive 30 minutes early. Letter mailed to the pt.

## 2017-06-23 NOTE — Telephone Encounter (Signed)
I called patient, stable from a cardiac standpoint for surgery. I'll forward to pharmacy for recommendations on holding Eliquis.  Kerin Ransom PA-C 06/23/2017 1:26 PM

## 2017-06-24 NOTE — Telephone Encounter (Signed)
Patient with diagnosis of atrial fluter on Eliquis for anticoagulation.    Procedure: mastectomy Date of procedure: TBD  CHADS2-VASc score of  5 (CHF, HTN, AGE, ,, , AGE, female)  TIA is listed in chart diagnoses, however states it was a side effect of ambien, not a stroke.  Regardless, with the type of surgery and her CrCl, she will need to be off Eliquis for 3 days.  CrCl 38.02 Platelet count 186  Per office protocol, patient can hold Eliquis for 3 days prior to procedure.    Patient should restart Eliquis on the evening of procedure or day after, at discretion of procedure MD

## 2017-06-27 ENCOUNTER — Encounter: Payer: Self-pay | Admitting: *Deleted

## 2017-06-27 ENCOUNTER — Inpatient Hospital Stay: Payer: Medicare Other | Attending: Hematology and Oncology | Admitting: Hematology and Oncology

## 2017-06-27 DIAGNOSIS — I110 Hypertensive heart disease with heart failure: Secondary | ICD-10-CM | POA: Diagnosis not present

## 2017-06-27 DIAGNOSIS — R002 Palpitations: Secondary | ICD-10-CM | POA: Diagnosis not present

## 2017-06-27 DIAGNOSIS — I4892 Unspecified atrial flutter: Secondary | ICD-10-CM | POA: Insufficient documentation

## 2017-06-27 DIAGNOSIS — G4700 Insomnia, unspecified: Secondary | ICD-10-CM | POA: Diagnosis not present

## 2017-06-27 DIAGNOSIS — C50412 Malignant neoplasm of upper-outer quadrant of left female breast: Secondary | ICD-10-CM | POA: Diagnosis present

## 2017-06-27 DIAGNOSIS — M8580 Other specified disorders of bone density and structure, unspecified site: Secondary | ICD-10-CM | POA: Insufficient documentation

## 2017-06-27 DIAGNOSIS — Z79899 Other long term (current) drug therapy: Secondary | ICD-10-CM | POA: Diagnosis not present

## 2017-06-27 DIAGNOSIS — K449 Diaphragmatic hernia without obstruction or gangrene: Secondary | ICD-10-CM | POA: Diagnosis not present

## 2017-06-27 DIAGNOSIS — E7800 Pure hypercholesterolemia, unspecified: Secondary | ICD-10-CM | POA: Diagnosis not present

## 2017-06-27 DIAGNOSIS — I4891 Unspecified atrial fibrillation: Secondary | ICD-10-CM

## 2017-06-27 DIAGNOSIS — K219 Gastro-esophageal reflux disease without esophagitis: Secondary | ICD-10-CM

## 2017-06-27 DIAGNOSIS — M5136 Other intervertebral disc degeneration, lumbar region: Secondary | ICD-10-CM

## 2017-06-27 DIAGNOSIS — I5032 Chronic diastolic (congestive) heart failure: Secondary | ICD-10-CM | POA: Diagnosis not present

## 2017-06-27 DIAGNOSIS — Z9012 Acquired absence of left breast and nipple: Secondary | ICD-10-CM | POA: Insufficient documentation

## 2017-06-27 DIAGNOSIS — I350 Nonrheumatic aortic (valve) stenosis: Secondary | ICD-10-CM

## 2017-06-27 DIAGNOSIS — Z8673 Personal history of transient ischemic attack (TIA), and cerebral infarction without residual deficits: Secondary | ICD-10-CM

## 2017-06-27 DIAGNOSIS — Z170 Estrogen receptor positive status [ER+]: Secondary | ICD-10-CM | POA: Diagnosis not present

## 2017-06-27 NOTE — Assessment & Plan Note (Signed)
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  ER 100%, PR 100%, Ki-67 70%, HER-2 positive ratio 2.03, gene copy #9.25, T2 N0 stage Ib  Pathology and radiology counseling: Discussed with the patient, the details of pathology including the type of breast cancer,the clinical staging, the significance of ER, PR and HER-2/neu receptors and the implications for treatment. After reviewing the pathology in detail, we proceeded to discuss the different treatment options between surgery, radiation, chemotherapy, antiestrogen therapies.  Recommendation: 1. Mastectomy  2. followed by oral antiestrogen therapy   Even though patient is HER-2 positive, given her advanced age, I did not recommend anti-HER-2 therapy.  Therefore there is no indication for port placement.   I will see the patient postoperatively to discuss her treatment plan.

## 2017-06-27 NOTE — Progress Notes (Signed)
[facility] CONSULT NOTE  Patient Care Team: Dorothyann Peng, NP as PCP - General (Family Medicine) Darlin Coco, MD (Cardiology) Garald Balding, MD (Orthopedic Surgery)  CHIEF COMPLAINTS/PURPOSE OF CONSULTATION:  Newly diagnosed breast cancer  HISTORY OF PRESENTING ILLNESS:  Glenda [name] 84 y.o. female is here because of recent diagnosis of left breast cancer.  Patient felt a lump in the left breast and immediately brought to the attention of her doctors.  She has not had a mammogram in the past 5 years.  It appears that this lump was significant and she underwent a biopsy which revealed invasive ductal carcinoma with DCIS that was ER PR positive and HER-2 amplified.  She was presented in the tumor board and we asked that she come see [name] before her surgery so that we can decide on the adjuvant treatment plan.  She is here today accompanied by her son who is an Therapist, sports who works for [employer].  Patient lives in a long-term independent living facility and generally stays very active but requires a walker to get around.  She is going to palpitations and intermittent atrial fibrillation for a long time and it takes blood thinners.  She probably had severe osteopenia previously and has not had a bone density in the last 7 years.  Previously her T score was -2.3.  She was on Fosamax at some point and was discontinued later on.  I reviewed her records extensively and collaborated the history with the patient.  SUMMARY OF ONCOLOGIC HISTORY:   Malignant neoplasm of upper-outer quadrant of left breast in female, estrogen receptor positive ([place])   06/07/2017 Initial Diagnosis    Suspicious mass left breast 1 o'clock position at the site of the palpable lump 2.3 x 1.2 x 3.8 cm, 1 cm away is some mild satellite lesions 7 x 5 x 5 mm, no axillary lymph nodes biopsy revealed IDC with DCIS grade 3.  ER 100%, PR 100%, Ki-67 70%, HER-2 positive ratio 2.03, gene copy #9.25, T2 N0 stage  Ib      MEDICAL HISTORY:  Past Medical History:  Diagnosis Date  . Aortic stenosis    Echo 02/2011 showing mild AS with normal LV systolic function  . Arthritis    "lower back" (10/09/2015)  . Atrial flutter with rapid ventricular response ([place]) 08/19/2016   s/p successful DCCV on 08/20/16, continue eliquis  . Chronic bronchitis ([place])    "off and on; several years" (10/09/2015)  . Chronic diastolic CHF (congestive heart failure) ([place])    a. 12/2015 Echo: EF 55-60%, mild AI/MR, mildly dil LA.  Marland Kitchen Chronic lower back pain   . Complication of anesthesia    "they had trouble waking me up after colon resection"  . DDD (degenerative disc disease), lumbar    severe facet dz and adv DDD MRI L spine 2009  . Diverticulosis   . GERD (gastroesophageal reflux disease)   . Hiatal hernia    hx  . Hypercholesterolemia   . Hypertension   . Insomnia   . OA (osteoarthritis) of knee   . Paroxysmal atrial flutter ([place])    a. 09/2015 s/p TEE/DCCV;  b. CHA2DS2VASc = 5-->Eliquis.  . Peripheral edema   . PVCs (premature ventricular contractions)   . Sacral fracture ([place]) 11/06/2012  . TIA (transient ischemic attack)    pt denies this hx on 10/09/2015 "it was the Pasatiempo I was taking; they had thought I was having a stroke"    SURGICAL HISTORY: Past  Surgical History:  Procedure Laterality Date  . BREAST BIOPSY Left   . CARDIAC CATHETERIZATION  02/17/2001   MILD REGURGITATION. EF 60%  . CARDIOVERSION N/A 10/08/2015   Procedure: CARDIOVERSION;  Surgeon: Sanda Klein, MD;  Location: [facility];  Service: Cardiovascular;  Laterality: N/A;  . CARDIOVERSION N/A 08/20/2016   Procedure: Cardioversion;  Surgeon: Evans Lance, MD;  Location: [facility] CV LAB;  Service: Cardiovascular;  Laterality: N/A;  . CATARACT EXTRACTION W/ INTRAOCULAR LENS  IMPLANT, BILATERAL Bilateral   . CESAREAN SECTION  1954  . COLECTOMY    . COLONOSCOPY    . EXCISIONAL HEMORRHOIDECTOMY    . INGUINAL HERNIA REPAIR Bilateral    . KYPHOPLASTY Right 09/11/2012   Procedure: Right Acrylic Sacroplasty;  Surgeon: Kristeen Miss, MD;  Location: [facility];  Service: Neurosurgery;  Laterality: Right;  Right  Acrylic Sacroplasty  . TEE WITHOUT CARDIOVERSION N/A 10/08/2015   Procedure: TRANSESOPHAGEAL ECHOCARDIOGRAM (TEE);  Surgeon: Sanda Klein, MD;  Location: [facility] ENDOSCOPY;  Service: Cardiovascular;  Laterality: N/A;  . TOTAL KNEE ARTHROPLASTY Right   . UMBILICAL HERNIA REPAIR      SOCIAL HISTORY: Social History   Socioeconomic History  . Marital status: Widowed    Spouse name: Not on file  . Number of children: Not on file  . Years of education: Not on file  . Highest education level: Not on file  Occupational History  . Not on file  Social Needs  . Financial resource strain: Not on file  . Food insecurity:    Worry: Not on file    Inability: Not on file  . Transportation needs:    Medical: Not on file    Non-medical: Not on file  Tobacco Use  . Smoking status: Never Smoker  . Smokeless tobacco: Never Used  Substance and Sexual Activity  . Alcohol use: No  . Drug use: No  . Sexual activity: Never  Lifestyle  . Physical activity:    Days per week: Not on file    Minutes per session: Not on file  . Stress: Not on file  Relationships  . Social connections:    Talks on phone: Not on file    Gets together: Not on file    Attends religious service: Not on file    Active member of club or organization: Not on file    Attends meetings of clubs or organizations: Not on file    Relationship status: Not on file  . Intimate partner violence:    Fear of current or ex partner: Not on file    Emotionally abused: Not on file    Physically abused: Not on file    Forced sexual activity: Not on file  Other Topics Concern  . Not on file  Social History Narrative   Widowed for 6 years    She has two children ( one local, and one in [place])     FAMILY HISTORY: Family History  Problem Relation Age of Onset   . Heart disease Mother   . Hypertension Father   . Heart disease Sister   . Hypertension Sister   . Heart attack Neg Hx   . Stroke Neg Hx     ALLERGIES:  is allergic to ace inhibitors; amitriptyline; avelox [moxifloxacin hcl in nacl]; halcion [triazolam]; pentazocine lactate; sulfa drugs cross reactors; trazodone and nefazodone; and penicillins.  MEDICATIONS:  Current Outpatient Medications  Medication Sig Dispense Refill  . amLODipine (NORVASC) 5 MG tablet Take 0.5 tablets (2.5 mg  total) by mouth daily. 30 tablet 3  . apixaban (ELIQUIS) 2.5 MG TABS tablet Take 1 tablet (2.5 mg total) by mouth 2 (two) times daily. 60 tablet 4  . Cranberry 425 MG CAPS Take 425 mg by mouth 2 (two) times daily.    Marland Kitchen docusate sodium (COLACE) 100 MG capsule Take 100 mg by mouth 2 (two) times daily.    . furosemide (LASIX) 20 MG tablet Take 1.5 tablets (30 mg total) by mouth daily. 45 tablet 3  . HYDROcodone-acetaminophen (NORCO) 7.5-325 MG tablet Take 7.5-325 tablets by mouth 3 (three) times daily.    Marland Kitchen losartan (COZAAR) 50 MG tablet Take 1 tablet (50 mg total) by mouth daily. 30 tablet 11  . Multiple Vitamins-Minerals (CEROVITE ADVANCED FORMULA PO) Take 1 tablet by mouth daily.    Marland Kitchen omeprazole (PRILOSEC) 40 MG capsule Take 40 mg by mouth daily.    . ondansetron (ZOFRAN) 4 MG tablet Take 4 mg by mouth every 6 (six) hours as needed for nausea or vomiting.    . polyethylene glycol (MIRALAX / GLYCOLAX) packet Take 17 g by mouth daily. Taken  Daily Monday to Saturday.    Marland Kitchen PROAIR HFA 108 (90 Base) MCG/ACT inhaler INHALE 1-2 PUFFS BY MOUTH EVERY 4-6 HOURS AS NEEDED  0  . temazepam (RESTORIL) 15 MG capsule Take 15 mg by mouth at bedtime as needed.      No current facility-administered medications for this visit.     REVIEW OF SYSTEMS:   Constitutional: Denies fevers, chills or abnormal night sweats Eyes: Denies blurriness of vision, double vision or watery eyes Ears, nose, mouth, throat, and face: Denies  mucositis or sore throat Respiratory: Denies cough, dyspnea or wheezes Cardiovascular: Intermittent palpitations Gastrointestinal:  Denies nausea, heartburn or change in bowel habits Skin: Denies abnormal skin rashes Lymphatics: Denies new lymphadenopathy or easy bruising Neurological:Denies numbness, tingling or new weaknesses Behavioral/Psych: Mood is stable, no new changes  Breast: Palpable lump in the left breast e All other systems were reviewed with the patient and are negative.  PHYSICAL EXAMINATION: ECOG PERFORMANCE STATUS: 2 - Symptomatic, <50% confined to bed  Vitals:   06/27/17 1603  BP: (!) 186/76  Pulse: (!) 56  Resp: 17  Temp: 98.4 F (36.9 C)  SpO2: 100%   Filed Weights   06/27/17 1603  Weight: 120 lb 14.4 oz (54.8 kg)    GENERAL:alert, no distress and comfortable SKIN: skin color, texture, turgor are normal, no rashes or significant lesions EYES: normal, conjunctiva are pink and non-injected, sclera clear OROPHARYNX:no exudate, no erythema and lips, buccal mucosa, and tongue normal  NECK: supple, thyroid normal size, non-tender, without nodularity LYMPH:  no palpable lymphadenopathy in the cervical, axillary or inguinal LUNGS: clear to auscultation and percussion with normal breathing effort HEART: regular rate & rhythm and no murmurs and no lower extremity edema ABDOMEN:abdomen soft, non-tender and normal bowel sounds Musculoskeletal:no cyanosis of digits and no clubbing  PSYCH: alert & oriented x 3 with fluent speech NEURO: no focal motor/sensory deficits   LABORATORY DATA:  I have reviewed the data as listed Lab Results  Component Value Date   WBC 5.9 02/11/2017   HGB 13.3 02/11/2017   HCT 39.8 02/11/2017   MCV 90.7 02/11/2017   PLT 186 02/11/2017   Lab Results  Component Value Date   NA 134 (L) 02/11/2017   K 5.0 02/11/2017   CL 99 (L) 02/11/2017   CO2 25 02/11/2017    RADIOGRAPHIC STUDIES: I have personally reviewed  the radiological  reports and agreed with the findings in the report.  ASSESSMENT AND PLAN:  Malignant neoplasm of upper-outer quadrant of left breast in female, estrogen receptor positive ([place]) 06/07/2017 suspicious mass left breast 1 o'clock position at the site of the palpable lump 2.3 x 1.2 x 3.8 cm, 1 cm away is some mild satellite lesions 7 x 5 x 5 mm, no axillary lymph nodes biopsy revealed IDC with DCIS grade 3.  ER 100%, PR 100%, Ki-67 70%, HER-2 positive ratio 2.03, gene copy #9.25, T2 N0 stage Ib  Pathology and radiology counseling: Discussed with the patient, the details of pathology including the type of breast cancer,the clinical staging, the significance of ER, PR and HER-2/neu receptors and the implications for treatment. After reviewing the pathology in detail, we proceeded to discuss the different treatment options between surgery, radiation, chemotherapy, antiestrogen therapies.  Recommendation: 1. Mastectomy  2. followed by oral antiestrogen therapy: We discussed the risks and benefits of aromatase inhibitor therapy versus tamoxifen.  Based upon her previous risk of osteoporosis, patient appears to be preferring tamoxifen therapy. I sent a message to Dr. Excell Seltzer to proceed with her surgery without requiring a port placement.  Even though patient is HER-2 positive, given her advanced age, I did not recommend anti-HER-2 therapy.  Therefore there is no indication for port placement.   I will see the patient postoperatively to discuss her treatment plan.   All questions were answered. The patient knows to call the clinic with any problems, questions or concerns.    Harriette Ohara, MD 06/27/17

## 2017-07-05 ENCOUNTER — Telehealth: Payer: Self-pay | Admitting: Cardiology

## 2017-07-05 NOTE — Telephone Encounter (Signed)
New message   Patient calling with concerns about wearing heart monitor. Patient states she is ready to stop wearing monitor . Requesting a call from nurse.

## 2017-07-05 NOTE — Telephone Encounter (Signed)
Spoke with pt who was concerned that she would have to complete wearing Heart monitor before she is cleared for her surgery. Pt was informed that she has already been cleared but still needs to continue to wear monitor for the recommend time frame. Pt also concerned that Dr. Stanford Breed is unaware of her readings. Explained to Pt that we only receive the strips daily if there is an urgent concern but he will receive all results at the completed time. Pt verbalized understanding.

## 2017-07-05 NOTE — Telephone Encounter (Signed)
Called patient, but, she was leaving so she said she will call back in 1 hour.

## 2017-07-05 NOTE — Telephone Encounter (Signed)
This note will be sent to [facility] at the number provided.

## 2017-07-08 ENCOUNTER — Telehealth: Payer: Self-pay | Admitting: Hematology and Oncology

## 2017-07-08 NOTE — Telephone Encounter (Signed)
Spoke to patient regarding upcoming may appointments per 4/18 sch message

## 2017-07-11 ENCOUNTER — Encounter: Payer: Self-pay | Admitting: Family Medicine

## 2017-07-11 ENCOUNTER — Ambulatory Visit: Payer: Self-pay | Admitting: General Surgery

## 2017-07-11 ENCOUNTER — Ambulatory Visit (INDEPENDENT_AMBULATORY_CARE_PROVIDER_SITE_OTHER): Payer: Medicare Other | Admitting: Family Medicine

## 2017-07-11 DIAGNOSIS — E7800 Pure hypercholesterolemia, unspecified: Secondary | ICD-10-CM | POA: Diagnosis not present

## 2017-07-11 DIAGNOSIS — I4892 Unspecified atrial flutter: Secondary | ICD-10-CM

## 2017-07-11 DIAGNOSIS — I10 Essential (primary) hypertension: Secondary | ICD-10-CM

## 2017-07-11 DIAGNOSIS — I5032 Chronic diastolic (congestive) heart failure: Secondary | ICD-10-CM | POA: Diagnosis not present

## 2017-07-11 DIAGNOSIS — G4700 Insomnia, unspecified: Secondary | ICD-10-CM

## 2017-07-11 DIAGNOSIS — M159 Polyosteoarthritis, unspecified: Secondary | ICD-10-CM

## 2017-07-11 NOTE — Assessment & Plan Note (Addendum)
S: mild poorly controlled on lasix 30mg , losartan 50mg  (confirmed by [facility]) and amlodipine 2.5mg  (half of 5mg  tablet). I had misinterpreted med list.  BP Readings from Last 3 Encounters:  07/11/17 (!) 142/86  06/27/17 (!) 186/76  05/31/17 122/66  A/P: We discussed blood pressure goal of <140/90 ideally thought technically per [facility] could go up to 150. Continue current meds:  Lasix 30mg , losartan 50mg , amlodipien 2.5mg .   After visit- decided to increase losartan to 100mg  - suspect she will have repeat labs at time of surgery in early may.

## 2017-07-11 NOTE — Assessment & Plan Note (Signed)
S: takes lasix 30 mg . Weights stable. No significant edema.  A/P: continue current medicine. Amlodipine is not ideal given potential for edema.

## 2017-07-11 NOTE — Pre-Procedure Instructions (Signed)
Glenda [name]  07/11/2017      [place], [place] [address] [place] [address] Phone: [phone] Fax: [phone]    Your procedure is scheduled on May 3  Report to [facility] at 1100 A.M.  Call this number if you have problems the morning of surgery:  [phone]   Remember:  Do not eat food or drink liquids after midnight.  Continue all medications as directed by your physician except follow these medication instructions before surgery below   Take these medicines the morning of surgery with A SIP OF WATER  amLODipine (NORVASC HYDROcodone-acetaminophen (NORCO) omeprazole (PRILOSEC) PROAIR HFA   7 days prior to surgery STOP taking any Aspirin(unless otherwise instructed by your surgeon), Aleve, Naproxen, Ibuprofen, Motrin, Advil, Goody's, BC's, all herbal medications, fish oil, and all vitamins  Stop Eliquis 3 days prior to surgery    Do not wear jewelry, make-up or nail polish.  Do not wear lotions, powders, or perfumes, or deodorant.  Do not shave 48 hours prior to surgery.    Do not bring valuables to the hospital.  [facility] is not responsible for any belongings or valuables.  Contacts, dentures or bridgework may not be worn into surgery.  Leave your suitcase in the car.  After surgery it may be brought to your room.  For patients admitted to the hospital, discharge time will be determined by your treatment team.  Patients discharged the day of surgery will not be allowed to drive home.   Special instructions:   [facility]- Preparing For Surgery  Before surgery, you can play an important role. Because skin is not sterile, your skin needs to be as free of germs as possible. You can reduce the number of germs on your skin by washing with CHG (chlorahexidine gluconate) Soap before surgery.  CHG is an antiseptic cleaner which kills germs and bonds with the skin to continue killing germs even after  washing.  Please do not use if you have an allergy to CHG or antibacterial soaps. If your skin becomes reddened/irritated stop using the CHG.  Do not shave (including legs and underarms) for at least 48 hours prior to first CHG shower. It is OK to shave your face.  Please follow these instructions carefully.   1. Shower the NIGHT BEFORE SURGERY and the MORNING OF SURGERY with CHG.   2. If you chose to wash your hair, wash your hair first as usual with your normal shampoo.  3. After you shampoo, rinse your hair and body thoroughly to remove the shampoo.  4. Use CHG as you would any other liquid soap. You can apply CHG directly to the skin and wash gently with a scrungie or a clean washcloth.   5. Apply the CHG Soap to your body ONLY FROM THE NECK DOWN.  Do not use on open wounds or open sores. Avoid contact with your eyes, ears, mouth and genitals (private parts). Wash Face and genitals (private parts)  with your normal soap.  6. Wash thoroughly, paying special attention to the area where your surgery will be performed.  7. Thoroughly rinse your body with warm water from the neck down.  8. DO NOT shower/wash with your normal soap after using and rinsing off the CHG Soap.  9. Pat yourself dry with a CLEAN TOWEL.  10. Wear CLEAN PAJAMAS to bed the night before surgery, wear comfortable clothes the morning of surgery  11. Place  CLEAN SHEETS on your bed the night of your first shower and DO NOT SLEEP WITH PETS.    Day of Surgery: Do not apply any deodorants/lotions. Please wear clean clothes to the hospital/surgery center.      Please read over the following fact sheets that you were given.

## 2017-07-11 NOTE — Progress Notes (Addendum)
Phone: [phone]  Subjective:  Patient presents today to establish care with me as their new primary care provider. Patient was formerly a patient of Dorothyann Peng, NP. Chief complaint-noted.   See problem oriented charting ROS- no edema. No chest pain reported. Some palpitations. No cough, congestion or fevers.   The following were reviewed and entered/updated in epic: Past Medical History:  Diagnosis Date  . Aortic stenosis    Echo 02/2011 showing mild AS with normal LV systolic function  . Arthritis    "lower back" (10/09/2015)  . Atrial flutter with rapid ventricular response ([place]) 08/19/2016   s/p successful DCCV on 08/20/16, continue eliquis  . Chronic bronchitis ([place])    "off and on; several years" (10/09/2015)  . Chronic diastolic CHF (congestive heart failure) ([place])    a. 12/2015 Echo: EF 55-60%, mild AI/MR, mildly dil LA.  Marland Kitchen Chronic lower back pain   . Complication of anesthesia    "they had trouble waking me up after colon resection"  . DDD (degenerative disc disease), lumbar    severe facet dz and adv DDD MRI L spine 2009  . Diverticulosis   . GERD (gastroesophageal reflux disease)   . Hiatal hernia    hx  . Hypercholesterolemia   . Hypertension   . Insomnia   . OA (osteoarthritis) of knee   . Paroxysmal atrial flutter ([place])    a. 09/2015 s/p TEE/DCCV;  b. CHA2DS2VASc = 5-->Eliquis.  . Peripheral edema   . PVCs (premature ventricular contractions)   . Sacral fracture ([place]) 11/06/2012  . TIA (transient ischemic attack)    pt denies this hx on 10/09/2015 "it was the [place] I was taking; they had thought I was having a stroke"  . Vertigo 11/19/2012   Patient Active Problem List   Diagnosis Date Noted  . Malignant neoplasm of upper-outer quadrant of left breast in female, estrogen receptor positive ([place]) 06/27/2017    Priority: High  . Atrial flutter ([place])     Priority: High  . Chronic diastolic HF (heart failure) ([place]) 07/11/2012    Priority: High  .  Osteoarthritis 04/03/2012    Priority: High  . Shortness of breath 12/13/2014    Priority: Medium  . Aortic stenosis, mild 07/11/2012    Priority: Medium  . Insomnia 09/15/2010    Priority: Medium  . Hypercholesterolemia 09/15/2010    Priority: Medium  . History of cardioversion 08/20/2016    Priority: Low  . Palpitations 12/13/2014    Priority: Low  . Constipation 11/06/2012    Priority: Low  . Accelerated/malignant hypertension 09/14/2012    Priority: Low  . Anemia 06/10/2011    Priority: Low  . Essential hypertension 03/25/2011   Past Surgical History:  Procedure Laterality Date  . BREAST BIOPSY Left   . CARDIAC CATHETERIZATION  02/17/2001   MILD REGURGITATION. EF 60%  . CARDIOVERSION N/A 10/08/2015   Procedure: CARDIOVERSION;  Surgeon: Sanda Klein, MD;  Location: [facility];  Service: Cardiovascular;  Laterality: N/A;  . CARDIOVERSION N/A 08/20/2016   Procedure: Cardioversion;  Surgeon: Evans Lance, MD;  Location: [facility];  Service: Cardiovascular;  Laterality: N/A;  . CATARACT EXTRACTION W/ INTRAOCULAR LENS  IMPLANT, BILATERAL Bilateral   . CESAREAN SECTION  1954  . COLECTOMY     related to "blockage"  . COLONOSCOPY    . EXCISIONAL HEMORRHOIDECTOMY    . INGUINAL HERNIA REPAIR Bilateral   . KYPHOPLASTY Right 09/11/2012   Procedure: Right Acrylic Sacroplasty;  Surgeon: Kristeen Miss, MD;  Location: [facility]UR[facility] ORS;  Service: Neurosurgery;  Laterality: Right;  Right  Acrylic Sacroplasty  . TEE WITHOUT CARDIOVERSION N/A 10/08/2015   Procedure: TRANSESOPHAGEAL ECHOCARDIOGRAM (TEE);  Surgeon: Sanda Klein, MD;  Location: [facility] ENDOSCOPY;  Service: Cardiovascular;  Laterality: N/A;  . TOTAL KNEE ARTHROPLASTY Right   . UMBILICAL HERNIA REPAIR      Family History  Problem Relation Age of Onset  . Heart disease Mother   . Hypertension Father   . Other Sister        60 in 2019  . Other Sister        21 in 2019  . Heart disease Sister   . Heart attack Neg Hx    . Stroke Neg Hx     Medications- reviewed and updated Current Outpatient Medications  Medication Sig Dispense Refill  . amLODipine (NORVASC) 5 MG tablet Take 0.5 tablets (2.5 mg total) by mouth daily. (Patient not taking: Reported on 07/11/2017) 30 tablet 3  . apixaban (ELIQUIS) 2.5 MG TABS tablet Take 1 tablet (2.5 mg total) by mouth 2 (two) times daily. 60 tablet 4  . Cholecalciferol 1000 units tablet Take 1,000 Units by mouth daily.    . Cranberry 425 MG CAPS Take 425 mg by mouth 2 (two) times daily.    Marland Kitchen docusate sodium (COLACE) 100 MG capsule Take 100 mg by mouth 2 (two) times daily.    . furosemide (LASIX) 20 MG tablet Take 1.5 tablets (30 mg total) by mouth daily. 45 tablet 3  . HYDROcodone-acetaminophen (NORCO) 7.5-325 MG tablet Take 1 tablet by mouth 3 (three) times daily as needed (for pain).     . Multiple Vitamins-Minerals (CEROVITE ADVANCED FORMULA PO) Take 1 tablet by mouth daily. (0800)    . ondansetron (ZOFRAN) 4 MG tablet Take 4 mg by mouth every 6 (six) hours as needed for nausea or vomiting.    . polyethylene glycol (MIRALAX / GLYCOLAX) packet Take 17 g by mouth daily. Taken  Daily Monday to Saturday.    . temazepam (RESTORIL) 15 MG capsule Take 15 mg by mouth at bedtime.      Allergies-reviewed and updated Allergies  Allergen Reactions  . Ace Inhibitors Other (See Comments)    Cough  . Amitriptyline Other (See Comments)    hyper  . Avelox [Moxifloxacin Hcl In Nacl] Other (See Comments)    dizziness  . Halcion [Triazolam] Other (See Comments)    Doesn't recall  . Pentazocine Lactate Other (See Comments)    Doesn't recall  . Sulfa Drugs Cross Reactors Other (See Comments)    Doesn't recall  . Trazodone And Nefazodone Other (See Comments)    Not effective  . 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 CV: RRR no murmurs rubs or gallops Lungs: CTAB no crackles, wheeze, rhonchi Abdomen: soft/nontender/nondistended/normal bowel sounds.  Ext: no edema Skin: warm, dry Neuro: grossly normal, moves all extremities, walks with walker  Assessment/Plan:  If for surgery need clearance- would need cardiology involvement. surgeyr in less than 2 weeks.   prevnar 13 next well visit (has surgery soon so will hold off)  Chronic diastolic HF (heart failure) ([place]) S: takes lasix 30 mg . Weights stable. No significant edema.  A/P: continue current medicine. Amlodipine is not ideal given potential for edema.   Atrial flutter ([place]) S: had cardioversion in 2017. Has been having palpitations and is on monitor to see if PVCs or recurrent a fib. Remains on eliquis. No rate control as heart rate has been low in the past.  A/P: continue current medicine and cardiology follo wup  Hypercholesterolemia Very mild elevations- would not start on primary prevention. Last LDL right at 100.   Insomnia Temazepam 15mg  started by cardiology in 2012- apparently with poor sleep was provoking more palpitations/PVCs. We discussed risks for falls particularly with temazepam and narcotics both in her regimen- luckily she has had no falls. Sounds like there is a provider at [facility] providing some of her medicines.   Essential hypertension S: mild poorly controlled on lasix  30mg , losartan 50mg  (confirmed by [facility]) and amlodipine 2.5mg  (half of 5mg  tablet). I had misinterpreted med list.  BP Readings from Last 3 Encounters:  07/11/17 (!) 142/86  06/27/17 (!) 186/76  05/31/17 122/66  A/P: We discussed blood pressure goal of <140/90 ideally thought technically per [facility] could go up to 150. Continue current meds:  Lasix 30mg , losartan 50mg , amlodipien 2.5mg .   After visit- decided to increase losartan to 100mg  - suspect she will have repeat labs at time of surgery in early may.   Osteoarthritis S:Fingers, low back pain. Started on tramadol but then thought not strong enough and later advanced to [facility]. Has seen Dr. Ellene Route in past. Appears Dr. Mare Ferrari may have prescribed norco in past- now [facility] A/P: [facility] informs me that Wynn Maudlin, ANP with [facility] phone [phone] is prescribing norco and temasepam.   Addendum: After visit a few days later received information from [facility] that since patient was transitioning to me that [facility] would no longer prescribe Norco or temazepam.  I spoke with patient about referral to pain management but she would strongly prefer for me to prescribe all of her medications.  In this case, told patient would want to get her down to the Norco 5 mg dose and potentially only do this twice a day.  Also she needs to separate her last dose of temazepam by at least 6 hours from the [facility].  Future Appointments  Date Time Provider [place]  07/12/2017 11:00 AM [facility] PAT 2 [facility] None  07/29/2017 10:30 AM Nicholas Lose, MD [facility] None  09/20/2017 10:20 AM Lelon Perla, MD [facility]   Did not schedule follow up- advised 3 months after surgery or sooner if she needs [name]  Return precautions advised.  Garret Reddish, MD

## 2017-07-11 NOTE — Assessment & Plan Note (Signed)
Temazepam 15mg  started by cardiology in 2012- apparently with poor sleep was provoking more palpitations/PVCs. We discussed risks for falls particularly with temazepam and narcotics both in her regimen- luckily she has had no falls. Sounds like there is a provider at [facility] providing some of her medicines.

## 2017-07-11 NOTE — Assessment & Plan Note (Signed)
Very mild elevations- would not start on primary prevention. Last LDL right at 100.

## 2017-07-11 NOTE — Assessment & Plan Note (Signed)
S: had cardioversion in 2017. Has been having palpitations and is on monitor to see if PVCs or recurrent a fib. Remains on eliquis. No rate control as heart rate has been low in the past.  A/P: continue current medicine and cardiology follo wup

## 2017-07-11 NOTE — Patient Instructions (Addendum)
Health Maintenance Due  Topic Date Due  . PNA vac Low Risk Adult (2 of 2 - PCV13) - We will do this at the next healthy visit.  03/24/2012   Absolute pleasure to meet you today.   I want to find out why your losartan was stopped. I am interested in restarting it because last time it appears you were on it at cardiology your blood pressure was great. It is very slightly high today.   I also want to find out from [facility] who prescribes your norco and temezepam  I wish you the best with your surgery and recovery. I believe you are going to do great.   Consider home health nursing for your dressing changes and line care- I know your son could certainly help if needed.

## 2017-07-12 ENCOUNTER — Other Ambulatory Visit: Payer: Self-pay

## 2017-07-12 ENCOUNTER — Encounter (HOSPITAL_COMMUNITY): Payer: Self-pay

## 2017-07-12 ENCOUNTER — Encounter (HOSPITAL_COMMUNITY)
Admission: RE | Admit: 2017-07-12 | Discharge: 2017-07-12 | Disposition: A | Discharge status: Home / Self Care | Payer: Medicare Other | Source: Ambulatory Visit | Attending: General Surgery | Admitting: General Surgery

## 2017-07-12 ENCOUNTER — Telehealth: Payer: Self-pay | Admitting: Family Medicine

## 2017-07-12 DIAGNOSIS — Z01812 Encounter for preprocedural laboratory examination: Secondary | ICD-10-CM | POA: Diagnosis present

## 2017-07-12 LAB — CBC
HCT: 41.7 % (ref 36.0–46.0)
Hemoglobin: 14 g/dL (ref 12.0–15.0)
MCH: 30.4 pg (ref 26.0–34.0)
MCHC: 33.6 g/dL (ref 30.0–36.0)
MCV: 90.7 fL (ref 78.0–100.0)
Platelets: 190 10*3/uL (ref 150–400)
RBC: 4.6 MIL/uL (ref 3.87–5.11)
RDW: 13.3 % (ref 11.5–15.5)
WBC: 5.5 10*3/uL (ref 4.0–10.5)

## 2017-07-12 LAB — BASIC METABOLIC PANEL
Anion gap: 12 (ref 5–15)
BUN: 24 mg/dL — ABNORMAL HIGH (ref 6–20)
CO2: 23 mmol/L (ref 22–32)
Calcium: 9 mg/dL (ref 8.9–10.3)
Chloride: 100 mmol/L — ABNORMAL LOW (ref 101–111)
Creatinine, Ser: 1 mg/dL (ref 0.44–1.00)
GFR calc Af Amer: 56 mL/min — ABNORMAL LOW (ref >60)
GFR calc non Af Amer: 48 mL/min — ABNORMAL LOW (ref >60)
Glucose, Bld: 88 mg/dL (ref 65–99)
Potassium: 4 mmol/L (ref 3.5–5.1)
Sodium: 135 mmol/L (ref 135–145)

## 2017-07-12 MED ORDER — LOSARTAN POTASSIUM 100 MG PO TABS: 100.0000 mg | ORAL_TABLET | Freq: Every day | ORAL | 3 refills | Status: DC

## 2017-07-12 NOTE — Progress Notes (Addendum)
PCP - Dr. Yong Channel Cardiologist - Dr. Stanford Breed; clearance in chart and Epic  Chest x-ray - 02/11/2018 EKG - 05/31/2017 Stress Test -  ECHO - 01/14/2016 Cardiac Cath - 2002  Sleep Study - patient denies  Blood Thinner Instructions: Stop Eliquis 3 days prior to surgery; PT-INR DOS Aspirin Instructions:  Anesthesia review: yes, cardiac history  Patient denies shortness of breath, fever, cough and chest pain at PAT appointment   Patient verbalized understanding of instructions that were given to them at the PAT appointment. Patient was also instructed that they will need to review over the PAT instructions again at home before surgery.

## 2017-07-12 NOTE — Pre-Procedure Instructions (Signed)
SAMYRA LIMB  07/12/2017      [facility] [address] [place] [address] Phone: [phone] Fax: [phone]    Your procedure is scheduled on May 3  Report to [place] at 1100 A.M.  Call this number if you have problems the morning of surgery:  [phone]   Remember:  Do not eat food or drink liquids after midnight.   Please complete your PRE-SURGERY ENSURE that was given to before you leave your house the morning of surgery.  Please, if able, drink it in one sitting. DO NOT SIP.    Continue all medications as directed by your physician except follow these medication instructions before surgery below   Take these medicines the morning of surgery with A SIP OF WATER  amLODipine (NORVASC) HYDROcodone-acetaminophen (NORCO) omeprazole (PRILOSEC) PROAIR HFA   7 days prior to surgery STOP taking any Aspirin (unless otherwise instructed by your surgeon), Aleve, Naproxen, Ibuprofen, Motrin, Advil, Goody's, BC's, all herbal medications, fish oil, and all vitamins  Stop Eliquis 3 days prior to surgery    Do not wear jewelry, make-up or nail polish.  Do not wear lotions, powders, or perfumes, or deodorant.  Do not shave 48 hours prior to surgery.    Do not bring valuables to the hospital.  [facility] is not responsible for any belongings or valuables.  Hearing aids, eyeglasses, contacts, dentures or bridgework may not be worn into surgery.  Leave your suitcase in the car.  After surgery it may be brought to your room.  For patients admitted to the hospital, discharge time will be determined by your treatment team.  Patients discharged the day of surgery will not be allowed to drive home.   Special instructions:   [facility]- Preparing For Surgery  Before surgery, you can play an important role. Because skin is not sterile, your skin needs to be as free of germs as possible. You can reduce the number of  germs on your skin by washing with CHG (chlorahexidine gluconate) Soap before surgery.  CHG is an antiseptic cleaner which kills germs and bonds with the skin to continue killing germs even after washing.  Please do not use if you have an allergy to CHG or antibacterial soaps. If your skin becomes reddened/irritated stop using the CHG.  Do not shave (including legs and underarms) for at least 48 hours prior to first CHG shower. It is OK to shave your face.  Please follow these instructions carefully.   1. Shower the NIGHT BEFORE SURGERY and the MORNING OF SURGERY with CHG.   2. If you chose to wash your hair, wash your hair first as usual with your normal shampoo.  3. After you shampoo, rinse your hair and body thoroughly to remove the shampoo.  4. Use CHG as you would any other liquid soap. You can apply CHG directly to the skin and wash gently with a scrungie or a clean washcloth.   5. Apply the CHG Soap to your body ONLY FROM THE NECK DOWN.  Do not use on open wounds or open sores. Avoid contact with your eyes, ears, mouth and genitals (private parts). Wash Face and genitals (private parts)  with your normal soap.  6. Wash thoroughly, paying special attention to the area where your surgery will be performed.  7. Thoroughly rinse your body with warm water from the neck down.  8. DO NOT shower/wash with your normal soap  after using and rinsing off the CHG Soap.  9. Pat yourself dry with a CLEAN TOWEL.  10. Wear CLEAN PAJAMAS to bed the night before surgery, wear comfortable clothes the morning of surgery  11. Place CLEAN SHEETS on your bed the night of your first shower and DO NOT SLEEP WITH PETS.    Day of Surgery: Do not apply any deodorants/lotions. Please wear clean clothes to the hospital/surgery center.      Please read over the following fact sheets that you were given.

## 2017-07-12 NOTE — Telephone Encounter (Signed)
After visit- decided to increase losartan to 100mg  - suspect she will have repeat labs at time of surgery in early may.   Team- can you please contact [facility] to increase losartan to 100mg  (I left their form on Jamie's desk).    If she doesn't have labs during surgery in early may then we need to repeat a bmet under hypertension- please set a reminder for yourself (I can show you how to do this) to check chart and reach out to patient if not done.

## 2017-07-12 NOTE — Assessment & Plan Note (Signed)
S:Fingers, low back pain. Started on tramadol but then thought not strong enough and later advanced to [facility]. Has seen Dr. Ellene Route in past. Appears Dr. Mare Ferrari may have prescribed norco in past- now [facility] at [facility]: [facility] informs me that Wynn Maudlin, ANP with [facility] phone [phone] is prescribing norco and temasepam.

## 2017-07-13 NOTE — Progress Notes (Signed)
Anesthesia Chart Review:   Case:  [id number] Date/Time:  07/22/17 1245   Procedure:  TOTAL MASTECTOMY (Left Breast)   Anesthesia type:  General   Pre-op diagnosis:  LEFT BREAST CANCER   Location:  [facility] OR ROOM 12 / [place] OR   Surgeon:  Excell Seltzer, MD      DISCUSSION: Pt is a 84 year old female with hx of atrial flutter (s/p cardioversion), mild aortic stenosis (not evidence on 2017 echo). Pt is currently wearing cardiac event monitor to check for recurrent atrial flutter; this does not need to be resulted prior to surgery per cardiology notes. Pt Pt to hold eliquis 3 days before surgery.    VS: BP (!) 159/67   Pulse 63   Temp 36.8 C (Oral)   Resp 17   Ht 5\' 1"  (1.549 m)   Wt 119 lb 9 oz (54.2 kg)   SpO2 97%   BMI 22.59 kg/m   PROVIDERS: Marin Olp, MD Cardiologist is Kirk Ruths, MD. Last office visit 05/31/17. Pt cleared for surgery by Kerin Ransom, PA on 06/23/17 Oncologist is Nicholas Lose, MD   LABS: Labs reviewed: Acceptable for surgery.  - PT/INR will be obtained day of surgery  (all labs ordered are listed, but only abnormal results are displayed)  Labs Reviewed  BASIC METABOLIC PANEL - Abnormal; Notable for the following components:      Result Value   Chloride 100 (*)    BUN 24 (*)    GFR calc non Af Amer 48 (*)    GFR calc Af Amer 56 (*)    All other components within normal limits  CBC     IMAGES:  CXR 02/11/17: Cardiac enlargement. No evidence of active pulmonary disease. Aortic atherosclerosis.   EKG 05/31/17: sinus bradycardia (57 bpm) with 1st degree AV block. Cannot rule out anterior infarct, age undetermined.    CV:  Echo 01/14/16:  - Left ventricle: The cavity size was normal. There was mild concentric hypertrophy. Systolic function was normal. The estimated ejection fraction was in the range of 55% to 60%. - Aortic valve: There was mild regurgitation. - Mitral valve: There was mild regurgitation. - Left atrium: The atrium was mildly  dilated. - Atrial septum: No defect or patent foramen ovale was identified.  Exercise treadmill test 01/26/11:  - normal - no evidence of ischemia by ST analysis  Cardiac cath 02/17/01:  1. Mild to moderate coronary atherosclerotic heart disease, predominately involving the LAD with calcification proximally and luminal irregularities, and mild ostial right coronary artery disease (30%). 2. 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 3. Normal left ventricular function.   Past Medical History:  Diagnosis Date  . Aortic stenosis    Echo 02/2011 showing mild AS with normal LV systolic function  . Arthritis    "lower back" (10/09/2015)  . Atrial flutter with rapid ventricular response ([place]) 08/19/2016   s/p successful DCCV on 08/20/16, continue eliquis  . Chronic bronchitis ([place])    "off and on; several years" (10/09/2015)  . Chronic diastolic CHF (congestive heart failure) ([place])    a. 12/2015 Echo: EF 55-60%, mild AI/MR, mildly dil LA.  Marland Kitchen Chronic lower back pain   . Complication of anesthesia    "they had trouble waking me up after colon resection"  . DDD (degenerative disc disease), lumbar    severe facet dz and adv DDD MRI L spine 2009  . Diverticulosis   . GERD (gastroesophageal  reflux disease)   . Hiatal hernia    hx  . Hypercholesterolemia   . Hypertension   . Insomnia   . OA (osteoarthritis) of knee   . Paroxysmal atrial flutter ([place])    a. 09/2015 s/p TEE/DCCV;  b. CHA2DS2VASc = 5-->Eliquis.  . Peripheral edema   . PVCs (premature ventricular contractions)   . Sacral fracture ([place]) 11/06/2012  . TIA (transient ischemic attack)    pt denies this hx on 10/09/2015 "it was the [place] I was taking; they had thought I was having a stroke"  . Vertigo 11/19/2012    Past Surgical History:  Procedure Laterality Date  . BREAST BIOPSY Left   . CARDIAC CATHETERIZATION  02/17/2001   MILD REGURGITATION. EF 60%  . CARDIOVERSION N/A  10/08/2015   Procedure: CARDIOVERSION;  Surgeon: Sanda Klein, MD;  Location: [facility] ENDOSCOPY;  Service: Cardiovascular;  Laterality: N/A;  . CARDIOVERSION N/A 08/20/2016   Procedure: Cardioversion;  Surgeon: Evans Lance, MD;  Location: [facility] CV LAB;  Service: Cardiovascular;  Laterality: N/A;  . CATARACT EXTRACTION W/ INTRAOCULAR LENS  IMPLANT, BILATERAL Bilateral   . CESAREAN SECTION  1954  . COLECTOMY     related to "blockage"  . COLONOSCOPY    . EXCISIONAL HEMORRHOIDECTOMY    . INGUINAL HERNIA REPAIR Bilateral   . KYPHOPLASTY Right 09/11/2012   Procedure: Right Acrylic Sacroplasty;  Surgeon: Kristeen Miss, MD;  Location: [facility] NEURO ORS;  Service: Neurosurgery;  Laterality: Right;  Right  Acrylic Sacroplasty  . TEE WITHOUT CARDIOVERSION N/A 10/08/2015   Procedure: TRANSESOPHAGEAL ECHOCARDIOGRAM (TEE);  Surgeon: Sanda Klein, MD;  Location: [facility] ENDOSCOPY;  Service: Cardiovascular;  Laterality: N/A;  . TOTAL KNEE ARTHROPLASTY Right   . UMBILICAL HERNIA REPAIR      MEDICATIONS: . amLODipine (NORVASC) 5 MG tablet  . apixaban (ELIQUIS) 2.5 MG TABS tablet  . Cholecalciferol 1000 units tablet  . Cranberry 425 MG CAPS  . docusate sodium (COLACE) 100 MG capsule  . furosemide (LASIX) 20 MG tablet  . HYDROcodone-acetaminophen (NORCO) 7.5-325 MG tablet  . losartan (COZAAR) 100 MG tablet  . Multiple Vitamins-Minerals (CEROVITE ADVANCED FORMULA PO)  . ondansetron (ZOFRAN) 4 MG tablet  . polyethylene glycol (MIRALAX / GLYCOLAX) packet  . temazepam (RESTORIL) 15 MG capsule   No current facility-administered medications for this encounter.    - Pt to hold eliquis 3 days before surgery.    If labs acceptable day of surgery, I anticipate pt can proceed with surgery as scheduled.  Willeen Cass, FNP-BC [facility]/Anesthesiology Phone: [phone] 07/13/2017 12:26 PM

## 2017-07-13 NOTE — Progress Notes (Signed)
Received a call from [place] from [facility] re pre-op med instruction. States needs physician order for am med administration. Call placed to [place] at [place]. Alisha agreed to call Darlene to facilitates this process. Chartered loss adjuster [phone] triage nurse, Darlene contact info [phone].

## 2017-07-13 NOTE — Progress Notes (Signed)
Called patient, no answer and voicemail unavailable.

## 2017-07-15 MED ORDER — TEMAZEPAM 15 MG PO CAPS: 15.0000 mg | ORAL_CAPSULE | Freq: Every day | ORAL | 1 refills | Status: DC

## 2017-07-15 NOTE — Addendum Note (Signed)
Addended by: Marin Olp on: 07/15/2017 07:58 AM   Modules accepted: Orders

## 2017-07-22 ENCOUNTER — Encounter (HOSPITAL_COMMUNITY): Payer: Self-pay | Admitting: *Deleted

## 2017-07-22 ENCOUNTER — Ambulatory Visit (HOSPITAL_COMMUNITY): Payer: Medicare Other | Admitting: Certified Registered Nurse Anesthetist

## 2017-07-22 ENCOUNTER — Ambulatory Visit (HOSPITAL_COMMUNITY): Payer: Medicare Other | Admitting: Emergency Medicine

## 2017-07-22 ENCOUNTER — Encounter (HOSPITAL_COMMUNITY)
Admission: RE | Disposition: A | Discharge status: Home / Self Care | Payer: Self-pay | Source: Ambulatory Visit | Attending: General Surgery

## 2017-07-22 ENCOUNTER — Observation Stay (HOSPITAL_COMMUNITY)
Admission: RE | Admit: 2017-07-22 | Discharge: 2017-07-23 | Disposition: A | Discharge status: Home / Self Care | Payer: Medicare Other | Source: Ambulatory Visit | Attending: General Surgery | Admitting: General Surgery

## 2017-07-22 DIAGNOSIS — Z8673 Personal history of transient ischemic attack (TIA), and cerebral infarction without residual deficits: Secondary | ICD-10-CM | POA: Insufficient documentation

## 2017-07-22 DIAGNOSIS — I110 Hypertensive heart disease with heart failure: Secondary | ICD-10-CM | POA: Diagnosis not present

## 2017-07-22 DIAGNOSIS — I509 Heart failure, unspecified: Secondary | ICD-10-CM | POA: Diagnosis not present

## 2017-07-22 DIAGNOSIS — K219 Gastro-esophageal reflux disease without esophagitis: Secondary | ICD-10-CM | POA: Diagnosis not present

## 2017-07-22 DIAGNOSIS — D0512 Intraductal carcinoma in situ of left breast: Secondary | ICD-10-CM | POA: Diagnosis present

## 2017-07-22 DIAGNOSIS — L821 Other seborrheic keratosis: Secondary | ICD-10-CM | POA: Diagnosis not present

## 2017-07-22 DIAGNOSIS — Z7901 Long term (current) use of anticoagulants: Secondary | ICD-10-CM | POA: Insufficient documentation

## 2017-07-22 DIAGNOSIS — Z79899 Other long term (current) drug therapy: Secondary | ICD-10-CM | POA: Insufficient documentation

## 2017-07-22 DIAGNOSIS — C50912 Malignant neoplasm of unspecified site of left female breast: Secondary | ICD-10-CM | POA: Diagnosis present

## 2017-07-22 DIAGNOSIS — I4891 Unspecified atrial fibrillation: Secondary | ICD-10-CM | POA: Insufficient documentation

## 2017-07-22 HISTORY — PX: TOTAL MASTECTOMY: SHX6129

## 2017-07-22 LAB — PROTIME-INR
INR: 1.11
Prothrombin Time: 14.3 seconds (ref 11.4–15.2)

## 2017-07-22 SURGERY — MASTECTOMY, SIMPLE
Anesthesia: General | Site: Breast | Laterality: Left

## 2017-07-22 MED ORDER — HYDRALAZINE HCL 20 MG/ML IJ SOLN
INTRAMUSCULAR | Status: DC | PRN
  Administered 2017-07-22 (×2): 2 mg via INTRAVENOUS

## 2017-07-22 MED ORDER — PROPOFOL 10 MG/ML IV BOLUS
INTRAVENOUS | Status: AC
  Filled 2017-07-22: qty 20

## 2017-07-22 MED ORDER — DEXAMETHASONE SODIUM PHOSPHATE 10 MG/ML IJ SOLN
INTRAMUSCULAR | Status: AC
  Filled 2017-07-22: qty 1

## 2017-07-22 MED ORDER — HYDRALAZINE HCL 20 MG/ML IJ SOLN
INTRAMUSCULAR | Status: AC
  Filled 2017-07-22: qty 1

## 2017-07-22 MED ORDER — ONDANSETRON 4 MG PO TBDP: 4.0000 mg | ORAL_TABLET | Freq: Four times a day (QID) | ORAL | Status: DC | PRN

## 2017-07-22 MED ORDER — HYDROCODONE-ACETAMINOPHEN 5-325 MG PO TABS
1.0000 | ORAL_TABLET | ORAL | Status: DC | PRN
  Administered 2017-07-22: 2 via ORAL
  Administered 2017-07-23: 1 via ORAL
  Filled 2017-07-22: qty 1
  Filled 2017-07-22: qty 2

## 2017-07-22 MED ORDER — DOCUSATE SODIUM 100 MG PO CAPS
100.0000 mg | ORAL_CAPSULE | Freq: Two times a day (BID) | ORAL | Status: DC
  Administered 2017-07-22 – 2017-07-23 (×2): 100 mg via ORAL
  Filled 2017-07-22 (×2): qty 1

## 2017-07-22 MED ORDER — FENTANYL CITRATE (PF) 100 MCG/2ML IJ SOLN
INTRAMUSCULAR | Status: AC
  Administered 2017-07-22: 25 ug via INTRAVENOUS
  Filled 2017-07-22: qty 2

## 2017-07-22 MED ORDER — FUROSEMIDE 20 MG PO TABS
30.0000 mg | ORAL_TABLET | Freq: Every day | ORAL | Status: DC
  Administered 2017-07-23: 30 mg via ORAL
  Filled 2017-07-22: qty 2

## 2017-07-22 MED ORDER — POLYETHYLENE GLYCOL 3350 17 G PO PACK
17.0000 g | PACK | Freq: Every day | ORAL | Status: DC
  Administered 2017-07-23: 17 g via ORAL
  Filled 2017-07-22: qty 1

## 2017-07-22 MED ORDER — PROPOFOL 10 MG/ML IV BOLUS
INTRAVENOUS | Status: DC | PRN
  Administered 2017-07-22: 90 mg via INTRAVENOUS

## 2017-07-22 MED ORDER — STERILE WATER FOR IRRIGATION IR SOLN
Status: DC | PRN
  Administered 2017-07-22: 1000 mL

## 2017-07-22 MED ORDER — DEXAMETHASONE SODIUM PHOSPHATE 10 MG/ML IJ SOLN
INTRAMUSCULAR | Status: DC | PRN
  Administered 2017-07-22: 10 mg via INTRAVENOUS

## 2017-07-22 MED ORDER — LOSARTAN POTASSIUM 50 MG PO TABS
100.0000 mg | ORAL_TABLET | Freq: Every day | ORAL | Status: DC
  Administered 2017-07-23: 100 mg via ORAL
  Filled 2017-07-22: qty 2

## 2017-07-22 MED ORDER — CHLORHEXIDINE GLUCONATE CLOTH 2 % EX PADS: 6.0000 | MEDICATED_PAD | Freq: Once | CUTANEOUS | Status: DC

## 2017-07-22 MED ORDER — ACETAMINOPHEN 500 MG PO TABS
1000.0000 mg | ORAL_TABLET | ORAL | Status: AC
  Administered 2017-07-22: 1000 mg via ORAL
  Filled 2017-07-22: qty 2

## 2017-07-22 MED ORDER — EPHEDRINE SULFATE-NACL 50-0.9 MG/10ML-% IV SOSY
PREFILLED_SYRINGE | INTRAVENOUS | Status: DC | PRN
  Administered 2017-07-22: 5 mg via INTRAVENOUS

## 2017-07-22 MED ORDER — FENTANYL CITRATE (PF) 250 MCG/5ML IJ SOLN
INTRAMUSCULAR | Status: AC
  Filled 2017-07-22: qty 5

## 2017-07-22 MED ORDER — LIDOCAINE 2% (20 MG/ML) 5 ML SYRINGE
INTRAMUSCULAR | Status: AC
  Filled 2017-07-22: qty 5

## 2017-07-22 MED ORDER — LACTATED RINGERS IV SOLN
Freq: Once | INTRAVENOUS | Status: AC
  Administered 2017-07-22: 12:00:00 via INTRAVENOUS

## 2017-07-22 MED ORDER — ONDANSETRON HCL 4 MG/2ML IJ SOLN: 4.0000 mg | Freq: Four times a day (QID) | INTRAMUSCULAR | Status: DC | PRN

## 2017-07-22 MED ORDER — ONDANSETRON HCL 4 MG/2ML IJ SOLN
INTRAMUSCULAR | Status: AC
  Filled 2017-07-22: qty 2

## 2017-07-22 MED ORDER — LIDOCAINE 2% (20 MG/ML) 5 ML SYRINGE
INTRAMUSCULAR | Status: DC | PRN
  Administered 2017-07-22: 20 mg via INTRAVENOUS

## 2017-07-22 MED ORDER — FENTANYL CITRATE (PF) 100 MCG/2ML IJ SOLN
INTRAMUSCULAR | Status: AC
  Administered 2017-07-22: 100 ug via INTRAVENOUS
  Filled 2017-07-22: qty 2

## 2017-07-22 MED ORDER — ONDANSETRON HCL 4 MG/2ML IJ SOLN
INTRAMUSCULAR | Status: DC | PRN
  Administered 2017-07-22: 4 mg via INTRAVENOUS

## 2017-07-22 MED ORDER — SUGAMMADEX SODIUM 200 MG/2ML IV SOLN
INTRAVENOUS | Status: DC | PRN
  Administered 2017-07-22: 110 mg via INTRAVENOUS

## 2017-07-22 MED ORDER — FENTANYL CITRATE (PF) 250 MCG/5ML IJ SOLN
INTRAMUSCULAR | Status: DC | PRN
  Administered 2017-07-22 (×2): 50 ug via INTRAVENOUS

## 2017-07-22 MED ORDER — FENTANYL CITRATE (PF) 100 MCG/2ML IJ SOLN
100.0000 ug | Freq: Once | INTRAMUSCULAR | Status: AC
Start: 2017-07-22 — End: 2017-07-22
  Administered 2017-07-22: 100 ug via INTRAVENOUS

## 2017-07-22 MED ORDER — 0.9 % SODIUM CHLORIDE (POUR BTL) OPTIME
TOPICAL | Status: DC | PRN
  Administered 2017-07-22: 1000 mL

## 2017-07-22 MED ORDER — KCL-LACTATED RINGERS-D5W 20 MEQ/L IV SOLN
INTRAVENOUS | Status: DC
  Administered 2017-07-22: 18:00:00 via INTRAVENOUS
  Filled 2017-07-22 (×2): qty 1000

## 2017-07-22 MED ORDER — ROCURONIUM BROMIDE 50 MG/5ML IV SOLN
INTRAVENOUS | Status: AC
  Filled 2017-07-22: qty 1

## 2017-07-22 MED ORDER — FENTANYL CITRATE (PF) 250 MCG/5ML IJ SOLN
INTRAMUSCULAR | Status: AC
Start: 2017-07-22 — End: ?
  Filled 2017-07-22: qty 5

## 2017-07-22 MED ORDER — CEFAZOLIN SODIUM-DEXTROSE 2-4 GM/100ML-% IV SOLN
2.0000 g | INTRAVENOUS | Status: AC
  Administered 2017-07-22: 2 g via INTRAVENOUS
  Filled 2017-07-22: qty 100

## 2017-07-22 MED ORDER — GLYCOPYRROLATE 0.2 MG/ML IJ SOLN
INTRAMUSCULAR | Status: DC | PRN
  Administered 2017-07-22: 0.1 mg via INTRAVENOUS

## 2017-07-22 MED ORDER — FENTANYL CITRATE (PF) 100 MCG/2ML IJ SOLN
25.0000 ug | INTRAMUSCULAR | Status: DC | PRN
  Administered 2017-07-22 (×3): 25 ug via INTRAVENOUS

## 2017-07-22 MED ORDER — ROCURONIUM BROMIDE 100 MG/10ML IV SOLN
INTRAVENOUS | Status: DC | PRN
  Administered 2017-07-22: 30 mg via INTRAVENOUS

## 2017-07-22 MED ORDER — SUGAMMADEX SODIUM 200 MG/2ML IV SOLN
INTRAVENOUS | Status: AC
  Filled 2017-07-22: qty 2

## 2017-07-22 MED ORDER — MIDAZOLAM HCL 2 MG/2ML IJ SOLN
INTRAMUSCULAR | Status: AC
  Filled 2017-07-22: qty 2

## 2017-07-22 MED ORDER — BUPIVACAINE-EPINEPHRINE (PF) 0.5% -1:200000 IJ SOLN
INTRAMUSCULAR | Status: DC | PRN
  Administered 2017-07-22: 30 mL

## 2017-07-22 MED ORDER — TEMAZEPAM 15 MG PO CAPS
15.0000 mg | ORAL_CAPSULE | Freq: Every day | ORAL | Status: DC
  Administered 2017-07-22: 15 mg via ORAL
  Filled 2017-07-22: qty 1

## 2017-07-22 SURGICAL SUPPLY — 43 items
ADH SKN CLS APL DERMABOND .7 (GAUZE/BANDAGES/DRESSINGS) ×1
BINDER BREAST LRG (GAUZE/BANDAGES/DRESSINGS) ×2 IMPLANT
BINDER BREAST XLRG (GAUZE/BANDAGES/DRESSINGS) IMPLANT
BIOPATCH RED 1 DISK 7.0 (GAUZE/BANDAGES/DRESSINGS) IMPLANT
BIOPATCH RED 1IN DISK 7.0MM (GAUZE/BANDAGES/DRESSINGS)
CANISTER SUCT 3000ML PPV (MISCELLANEOUS) ×3 IMPLANT
CHLORAPREP W/TINT 26ML (MISCELLANEOUS) ×3 IMPLANT
CLIP VESOCCLUDE MED 6/CT (CLIP) ×3 IMPLANT
COVER SURGICAL LIGHT HANDLE (MISCELLANEOUS) ×3 IMPLANT
DERMABOND ADVANCED (GAUZE/BANDAGES/DRESSINGS) ×2
DERMABOND ADVANCED .7 DNX12 (GAUZE/BANDAGES/DRESSINGS) ×1 IMPLANT
DEVICE DISSECT PLASMABLAD 3.0S (MISCELLANEOUS) ×1 IMPLANT
DRAIN CHANNEL 19F RND (DRAIN) ×3 IMPLANT
DRAPE CHEST BREAST 15X10 FENES (DRAPES) ×3 IMPLANT
DRAPE SURG 17X23 STRL (DRAPES) ×3 IMPLANT
DRAPE UTILITY XL STRL (DRAPES) ×6 IMPLANT
ELECT CAUTERY BLADE 6.4 (BLADE) ×3 IMPLANT
ELECT REM PT RETURN 9FT ADLT (ELECTROSURGICAL) ×3
ELECTRODE REM PT RTRN 9FT ADLT (ELECTROSURGICAL) ×1 IMPLANT
EVACUATOR SILICONE 100CC (DRAIN) ×3 IMPLANT
GLOVE BIOGEL PI IND STRL 8 (GLOVE) ×1 IMPLANT
GLOVE BIOGEL PI INDICATOR 8 (GLOVE) ×2
GLOVE ECLIPSE 7.5 STRL STRAW (GLOVE) ×3 IMPLANT
GOWN STRL REUS W/ TWL LRG LVL3 (GOWN DISPOSABLE) ×1 IMPLANT
GOWN STRL REUS W/ TWL XL LVL3 (GOWN DISPOSABLE) ×1 IMPLANT
GOWN STRL REUS W/TWL LRG LVL3 (GOWN DISPOSABLE) ×3
GOWN STRL REUS W/TWL XL LVL3 (GOWN DISPOSABLE) ×3
KIT BASIN OR (CUSTOM PROCEDURE TRAY) ×3 IMPLANT
KIT TURNOVER KIT B (KITS) ×3 IMPLANT
NS IRRIG 1000ML POUR BTL (IV SOLUTION) ×3 IMPLANT
PACK GENERAL/GYN (CUSTOM PROCEDURE TRAY) ×3 IMPLANT
PAD ABD 8X10 STRL (GAUZE/BANDAGES/DRESSINGS) ×2 IMPLANT
PAD ARMBOARD 7.5X6 YLW CONV (MISCELLANEOUS) ×3 IMPLANT
PLASMABLADE 3.0S (MISCELLANEOUS) ×3
SPECIMEN JAR X LARGE (MISCELLANEOUS) ×3 IMPLANT
STAPLER VISISTAT 35W (STAPLE) ×3 IMPLANT
SUT ETHILON 2 0 FS 18 (SUTURE) ×3 IMPLANT
SUT MON AB 5-0 PS2 18 (SUTURE) ×5 IMPLANT
SUT VIC AB 3-0 SH 18 (SUTURE) ×3 IMPLANT
TOWEL OR 17X24 6PK STRL BLUE (TOWEL DISPOSABLE) ×3 IMPLANT
TOWEL OR 17X26 10 PK STRL BLUE (TOWEL DISPOSABLE) ×3 IMPLANT
TUBE CONNECTING 12'X1/4 (SUCTIONS) ×1
TUBE CONNECTING 12X1/4 (SUCTIONS) ×2 IMPLANT

## 2017-07-22 NOTE — Transfer of Care (Signed)
Immediate Anesthesia Transfer of Care Note  Patient: Glenda [name]  Procedure(s) Performed: TOTAL MASTECTOMY (Left Breast)  Patient Location: PACU  Anesthesia Type:General  Level of Consciousness: awake, alert  and oriented  Airway & Oxygen Therapy: Patient Spontanous Breathing and Patient connected to nasal cannula oxygen  Post-op Assessment: Report given to RN and Post -op Vital signs reviewed and stable  Post vital signs: Reviewed and stable  Last Vitals:  Vitals Value Taken Time  BP    Temp 36.2 C 07/22/2017  1:50 PM  Pulse    Resp    SpO2      Last Pain:  Vitals:   07/22/17 1100  TempSrc:   PainSc: 0-No pain         Complications: No apparent anesthesia complications

## 2017-07-22 NOTE — Anesthesia Preprocedure Evaluation (Addendum)
Anesthesia Evaluation  Patient identified by MRN, date of birth, ID band Patient awake    Reviewed: Allergy & Precautions, H&P , NPO status , Patient's Chart, lab work & pertinent test results  Airway Mallampati: II  TM Distance: >3 FB Neck ROM: Full    Dental no notable dental hx. (+) Teeth Intact, Dental Advisory Given   Pulmonary neg pulmonary ROS,    Pulmonary exam normal breath sounds clear to auscultation       Cardiovascular hypertension, Pt. on medications + dysrhythmias Atrial Fibrillation  Rhythm:Regular Rate:Normal     Neuro/Psych TIAnegative psych ROS   GI/Hepatic Neg liver ROS, hiatal hernia, GERD  Controlled,  Endo/Other  negative endocrine ROS  Renal/GU negative Renal ROS  negative genitourinary   Musculoskeletal  (+) Arthritis , Osteoarthritis,    Abdominal   Peds  Hematology negative hematology ROS (+) anemia ,   Anesthesia Other Findings   Reproductive/Obstetrics negative OB ROS                            Anesthesia Physical Anesthesia Plan  ASA: III  Anesthesia Plan: General   Post-op Pain Management:  Regional for Post-op pain   Induction: Intravenous  PONV Risk Score and Plan: 4 or greater and Ondansetron, Dexamethasone and Treatment may vary due to age or medical condition  Airway Management Planned: Oral ETT  Additional Equipment:   Intra-op Plan:   Post-operative Plan: Extubation in OR  Informed Consent: I have reviewed the patients History and Physical, chart, labs and discussed the procedure including the risks, benefits and alternatives for the proposed anesthesia with the patient or authorized representative who has indicated his/her understanding and acceptance.   Dental advisory given  Plan Discussed with: CRNA  Anesthesia Plan Comments:        Anesthesia Quick Evaluation

## 2017-07-22 NOTE — Op Note (Signed)
Preoperative Diagnosis: LEFT BREAST CANCER  Postoprative Diagnosis: LEFT BREAST CANCER  Procedure: Procedure(s): LEFT TOTAL MASTECTOMY   Surgeon: Excell Seltzer T   Assistants: None  Anesthesia:  General LMA anesthesia  Indications: 84 year old female with a new diagnosis of cancer of the left breast, upper outer quadrant. T2 N0 M0, triple positive.  After consultation and extensive discussion with the patient and her son we have elected to proceed with left total mastectomy and surgical treatment.    Procedure Detail: Patient was brought to the operating room, placed in the supine position on the operating table, and laryngeal mask general anesthesia induced.  She received preoperative IV antibiotics.  PAS were in place.  The left chest and upper arm were widely sterilely prepped and draped.  Patient timeout was performed and correct procedure verified.  A transversely oriented elliptical incision encompassing the nipple areolar complex and central breast skin was used.  Using the plasma blade skin and subcutaneous flaps were developed medially to the sternum, laterally to the anterior border of the latissimus, superiorly toward the clavicle and inferiorly to the inframammary crease.  Following this the breast tissue was reflected above the chest wall with the plasma blade working medial to lateral.  The specimen was dissected off of the pectoralis and serratus.  The edge of the pectoralis major was freed and the specimen was dissected up off of the inferior portion of the latissimus working up toward the axillary contents.  The clavipectoral fascia was incised just lateral to the pectoralis minor and the specimen thinned down to the low axilla.  I came across this with [facility] clamps and the specimen removed and this was tied with 3-0 Vicryl.  Specimen was oriented and sent for permanent pathology.  The wound was thoroughly irrigated and complete hemostasis obtained.  A 19 Blake closed suction  drain was left through separate stab wound under both flaps.  Subcutaneous tissue was closed with interrupted 3-0 Vicryl and skin with running subcuticular 5-0 Monocryl and Dermabond.  Sponge needle and instrument counts were correct.    Findings: As above  Estimated Blood Loss:  Minimal         Drains: 19 round Blake  Blood Given: none          Specimens: Left total mastectomy        Complications:  * No complications entered in OR log *         Disposition: PACU - hemodynamically stable.         Condition: stable

## 2017-07-22 NOTE — H&P (Signed)
History of Present Illness  The patient is a 84 year old female who presents with breast cancer. She is a postmenopausal female referred by Dr. Everlean Alstrom for evaluation of recently diagnosed carcinoma of the left breast. She discovered a lump in her left breast about 2 weeks ago.. Subsequent imaging included diagnostic mamogram showing an obscured mass on the left containing pleomorphic calcifications spanning up to 4.7 cm and ultrasound showing a 3.8 cm mass at the 1 o'clock position 3 cm from the nipple and a separate 7 mm satellite mass 1 cm away from this. Axilla was negative. An ultrasound guided breast biopsy was performed on June 07, 2017 with pathology revealing invasive ductal carcinoma of the breast. She is seen now in the office for initial treatment planning. She has experienced a breast lump but no other symptoms such as skin change or nipple discharge or pain. She does not have a personal history of a benign breast biopsy in the left breast, needle biopsy, for calcifications.  Findings at that time were the following: Tumor size: 3.8 and 0.7 total extent 4.7 cm Tumor grade: 3, Ki-67 70% Estrogen Receptor: Positive Progesterone Receptor: Positive Her-2 neu: Positive Lymph node status: Negative    Past Surgical History  Breast Biopsy  Left. Cataract Surgery  Bilateral. Cesarean Section - 1  Colon Removal - Partial  Hemorrhoidectomy  Knee Surgery  Right. Laparoscopic Inguinal Hernia Surgery  Bilateral.  Diagnostic Studies History  Colonoscopy  >10 years ago Mammogram  within last year  Allergies  Sulfa Drugs  Penicillins  Pentazocine-Naloxone HCl *ANALGESICS - OPIOID*  Amitriptyline & Diet Manage Pr *ANTIDEPRESSANTS*  Nefazodone HCl *ANTIDEPRESSANTS*  Halcion *HYPNOTICS/SEDATIVES/SLEEP DISORDER AGENTS*  Avelox *FLUOROQUINOLONES*  Allergies Reconciled   Medication History  Omeprazole (40MG Capsule DR, Oral) Active. Ondansetron (4MG  Tablet Disint, Oral) Active. ProAir HFA (108 (90 Base)MCG/ACT Aerosol Soln, Inhalation) Active. Cranberry (425MG Capsule, Oral) Active. Docusate Sodium (100MG Capsule, Oral) Active. Multiple Vitamin (Oral) Active. Polyethylene Active. Temazepam (15MG Capsule, Oral) Active. AmLODIPine Besylate (2.5MG Tablet, Oral) Active. Furosemide (20MG Tablet, Oral) Active. Hydrocodone-Acetaminophen (7.5-325MG Tablet, Oral) Active. Losartan Potassium (50MG Tablet, Oral) Active. Apixaban (2.5MG Tablet, Oral) Active. Medications Reconciled  Social History  No alcohol use  Tobacco use  Never smoker.  Family History  Hypertension  Mother.  Pregnancy / Birth History  Age of menopause  37-57 Length (months) of breastfeeding  3-6  Other Problems  Atrial Fibrillation  Breast Cancer  Congestive Heart Failure  High blood pressure  Lump In Breast  Ventral Hernia Repair     Review of Systems  General Not Present- Appetite Loss, Chills, Fatigue, Fever, Night Sweats, Weight Gain and Weight Loss. Skin Not Present- Change in Wart/Mole, Dryness, Hives, Jaundice, New Lesions, Non-Healing Wounds, Rash and Ulcer. Respiratory Not Present- Bloody sputum, Chronic Cough, Difficulty Breathing, Snoring and Wheezing. Breast Present- Breast Mass. Not Present- Breast Pain, Nipple Discharge and Skin Changes. Cardiovascular Not Present- Chest Pain, Difficulty Breathing Lying Down, Leg Cramps, Palpitations, Rapid Heart Rate, Shortness of Breath and Swelling of Extremities. Gastrointestinal Not Present- Abdominal Pain, Bloating, Bloody Stool, Change in Bowel Habits, Chronic diarrhea, Constipation, Difficulty Swallowing, Excessive gas, Gets full quickly at meals, Hemorrhoids, Indigestion, Nausea, Rectal Pain and Vomiting. Female Genitourinary Not Present- Frequency, Nocturia, Painful Urination, Pelvic Pain and Urgency. Musculoskeletal Present- Back Pain. Not Present- Joint Pain, Joint Stiffness,  Muscle Pain, Muscle Weakness and Swelling of Extremities. Neurological Present- Trouble walking. Not Present- Decreased Memory, Fainting, Headaches, Numbness, Seizures, Tingling, Tremor and Weakness. Psychiatric Present-  Fearful and Frequent crying. Not Present- Anxiety, Bipolar, Change in Sleep Pattern and Depression. Hematology Present- Blood Thinners and Easy Bruising. Not Present- Excessive bleeding, Gland problems, HIV and Persistent Infections.  Vitals   Weight: 119.38 lb Height: 60in Body Surface Area: 1.5 m Body Mass Index: 23.31 kg/m  Temp.: 53F(Oral)  Pulse: 89 (Regular)  BP: 124/80 (Sitting, Left Arm, Standard)       Physical Exam  The physical exam findings are as follows: Note:General: Alert, thin elderly but well-appearing Caucasian female, in no distress Skin: Warm and dry without rash or infection. HEENT: No palpable masses or thyromegaly. Sclera nonicteric. Pupils equal round and reactive. Lymph nodes: No cervical, supraclavicular, or inguinal nodes palpable. Breasts: There is a firm freely movable several centimeter mass in the upper outer left breast. Multiple keratoses. No other palpable masses in either breast. No palpable axillary adenopathy. Lungs: Breath sounds clear and equal. No wheezing or increased work of breathing. Cardiovascular: Regular rate and rhythm without murmer. No JVD or edema. Peripheral pulses intact. No carotid bruits. Abdomen: Nondistended. Soft and nontender. Well-healed long midline incision. There was a several centimeter palpable incisional hernia inferiorly that is gradually reducible and nontender Extremities: No edema or joint swelling or deformity. No chronic venous stasis changes. Neurologic: Alert and fully oriented. Gait somewhat slow. No focal weakness. Psychiatric: Normal mood and affect. Thought content appropriate with normal judgement and insight    Assessment & Plan  MALIGNANT NEOPLASM OF UPPER-OUTER  QUADRANT OF LEFT BREAST IN FEMALE, ESTROGEN RECEPTOR POSITIVE (C50.412) Impression: 84 year old female with a new diagnosis of cancer of the left breast, upper outer quadrant. T2 N0 M0, triple positive. I discussed with the patient and her son present today initial surgical treatment options. We discussed options of breast conservation with lumpectomy or total mastectomy and sentinal lymph node biopsy/dissection. We would be stretching the limits of lumpectomy based on the size of her tumor and satellite lesion and after discussion she would not want radiation therapy and we therefore elected to proceed with total mastectomy. There was discussion in tumor conference about possible postoperative HER-2 therapy and we will have her see medical oncology preoperatively in case a Port-A-Cath is needed. We discussed the indications and nature of the procedure, and expected recovery, in detail. Surgical risks including anesthetic complications, cardiorespiratory complications, bleeding, infection, wound healing complications, blood clots, lymphedema, local and distant recurrence. Chemotherapy, hormonal therapy and radiation therapy have been discussed. They have been provided with literature regarding the treatment of breast cancer. All questions were answered. They understand and agree to proceed and we will go ahead with scheduling. Current Plans I recommended obtaining preoperative cardiac clearance for recommendations on management of anticoagualtion perioperatively:   Request clearance by cardiology to better assess operative risk & see if a reevaluation, further workup, etc is needed. Also recommendations on how medications such as for anticoagulation and blood pressure should be managed/held/restarted after surgery.  Referred to Oncology, for evaluation and follow up (Oncology). Routine. Left total mastectomy under general anesthesia as an outpatient.

## 2017-07-22 NOTE — Anesthesia Procedure Notes (Signed)
Procedure Name: Intubation Date/Time: 07/22/2017 12:06 PM Performed by: Roderic Palau, MD Pre-anesthesia Checklist: Patient identified, Emergency Drugs available, Suction available and Patient being monitored Patient Re-evaluated:Patient Re-evaluated prior to induction Oxygen Delivery Method: Circle System Utilized Preoxygenation: Pre-oxygenation with 100% oxygen Induction Type: IV induction Ventilation: Mask ventilation without difficulty Laryngoscope Size: Miller and 2 Grade View: Grade II Tube type: Oral Number of attempts: 3 (DLx1 MAC 3 CRNA grade III, DLx1 CRNA Mil 2 grade II unsuccessful, DLx1 MDA grade II successful) Airway Equipment and Method: Stylet and Oral airway Placement Confirmation: ETT inserted through vocal cords under direct vision,  positive ETCO2 and breath sounds checked- equal and bilateral Secured at: 21 cm Tube secured with: Tape Dental Injury: Teeth and Oropharynx as per pre-operative assessment  Difficulty Due To: Difficult Airway- due to anterior larynx Comments: Improved visualization with Miller blade, anterior airway.

## 2017-07-22 NOTE — Anesthesia Postprocedure Evaluation (Signed)
Anesthesia Post Note  Patient: UNITY LUEPKE  Procedure(s) Performed: TOTAL MASTECTOMY (Left Breast)     Patient location during evaluation: PACU Anesthesia Type: General and Regional Level of consciousness: awake and alert Pain management: pain level controlled Vital Signs Assessment: post-procedure vital signs reviewed and stable Respiratory status: spontaneous breathing, nonlabored ventilation, respiratory function stable and patient connected to nasal cannula oxygen Cardiovascular status: blood pressure returned to baseline and stable Postop Assessment: no apparent nausea or vomiting Anesthetic complications: no    Last Vitals:  Vitals:   07/22/17 1420 07/22/17 1430  BP:  (!) 156/89  Pulse: 73 81  Resp: 16 13  Temp:    SpO2: 99% 99%    Last Pain:  Vitals:   07/22/17 1420  TempSrc:   PainSc: 0-No pain                 Margree Gimbel,W. EDMOND

## 2017-07-22 NOTE — Anesthesia Procedure Notes (Signed)
Anesthesia Regional Block: Pectoralis block   Pre-Anesthetic Checklist: ,, timeout performed, Correct Patient, Correct Site, Correct Laterality, Correct Procedure, Correct Position, site marked, Risks and benefits discussed, pre-op evaluation,  At surgeon's request and post-op pain management  Laterality: Left  Prep: Maximum Sterile Barrier Precautions used, chloraprep       Needles:  Injection technique: Single-shot  Needle Type: Echogenic Stimulator Needle     Needle Length: 9cm  Needle Gauge: 21     Additional Needles:   Procedures:,,,, ultrasound used (permanent image in chart),,,,  Narrative:  Start time: 07/22/2017 10:41 AM End time: 07/22/2017 10:51 AM Injection made incrementally with aspirations every 5 mL. Anesthesiologist: Roderic Palau, MD  Additional Notes: 2% Lidocaine skin wheel.

## 2017-07-22 NOTE — Interval H&P Note (Signed)
History and Physical Interval Note:  07/22/2017 11:40 AM  Glenda [name]  has presented today for surgery, with the diagnosis of LEFT BREAST CANCER  The various methods of treatment have been discussed with the patient and family. After consideration of risks, benefits and other options for treatment, the patient has consented to  Procedure(s): TOTAL MASTECTOMY (Left) as a surgical intervention .  The patient's history has been reviewed, patient examined, no change in status, stable for surgery.  I have reviewed the patient's chart and labs.  Questions were answered to the patient's satisfaction.     Darene Lamer [name]

## 2017-07-23 ENCOUNTER — Encounter (HOSPITAL_COMMUNITY): Payer: Self-pay | Admitting: General Surgery

## 2017-07-23 DIAGNOSIS — D0512 Intraductal carcinoma in situ of left breast: Secondary | ICD-10-CM | POA: Diagnosis not present

## 2017-07-23 LAB — CBC
HCT: 34.8 % — ABNORMAL LOW (ref 36.0–46.0)
Hemoglobin: 11.6 g/dL — ABNORMAL LOW (ref 12.0–15.0)
MCH: 30 pg (ref 26.0–34.0)
MCHC: 33.3 g/dL (ref 30.0–36.0)
MCV: 89.9 fL (ref 78.0–100.0)
Platelets: 180 10*3/uL (ref 150–400)
RBC: 3.87 MIL/uL (ref 3.87–5.11)
RDW: 13.3 % (ref 11.5–15.5)
WBC: 6.7 10*3/uL (ref 4.0–10.5)

## 2017-07-23 LAB — BASIC METABOLIC PANEL
Anion gap: 10 (ref 5–15)
BUN: 17 mg/dL (ref 6–20)
CO2: 25 mmol/L (ref 22–32)
Calcium: 8.7 mg/dL — ABNORMAL LOW (ref 8.9–10.3)
Chloride: 99 mmol/L — ABNORMAL LOW (ref 101–111)
Creatinine, Ser: 0.78 mg/dL (ref 0.44–1.00)
GFR calc Af Amer: >60 mL/min (ref >60)
GFR calc non Af Amer: >60 mL/min (ref >60)
Glucose, Bld: 158 mg/dL — ABNORMAL HIGH (ref 65–99)
Potassium: 4.6 mmol/L (ref 3.5–5.1)
Sodium: 134 mmol/L — ABNORMAL LOW (ref 135–145)

## 2017-07-23 MED ORDER — MENTHOL 3 MG MT LOZG
1.0000 | LOZENGE | OROMUCOSAL | Status: DC | PRN
Start: 2017-07-23 — End: 2017-07-23
  Filled 2017-07-23: qty 9

## 2017-07-23 MED ORDER — APIXABAN 2.5 MG PO TABS: 2.5000 mg | ORAL_TABLET | Freq: Two times a day (BID) | ORAL | 4 refills | Status: DC

## 2017-07-23 NOTE — Progress Notes (Signed)
[facility]/Trauma Progress Note  1 Day Post-Op   Assessment/Plan CHF A flutter on eliquis being held until 05/05 HERD HTN TIA  Left breast cancer - S/P left total mastectomy, Dr. Excell Seltzer, 05/03 - pt doing well and not requiring pain medicine  FEN: reg diet VTE: SCD's ID: Ancef preop  Foley: none Follow up: Dr. Thurmond Butts  DISPO: Pt is going to stay with son for a few days before going back to [place]. Case management for [facility] and social work for [facility] consults pending. Will discharge when [facility] is set up.    LOS: 0 days    Subjective: CC; left breast cancer  Pt is doing great and not requiring pain medicine. She denies any pain currently. She has been out of bed to urinate. She denies nausea, vomiting, fever, chills. No issues overnight. She is tolerating her diet.No family at bedside.  Objective: Vital signs in last 24 hours: Temp:  [97.2 F (36.2 C)-98.9 F (37.2 C)] 98.9 F (37.2 C) (05/04 0620) Pulse Rate:  [53-107] 58 (05/04 0620) Resp:  [10-23] 15 (05/04 0620) BP: (115-202)/(59-102) 140/72 (05/04 0620) SpO2:  [97 %-100 %] 98 % (05/04 0620) Weight:  [54.2 kg (119 lb 9 oz)] 54.2 kg (119 lb 9 oz) (05/03 0921) Last BM Date: 07/21/17  Intake/Output from previous day: 05/03 0701 - 05/04 0700 In: 600 [I.V.:600] Out: 952 [Urine:875; Drains:27; Blood:50] Intake/Output this shift: Total I/O In: -  Out: 40 [Drains:40]  PE: Gen:  Alert, NAD, pleasant, cooperative Left Breast: incision with glue intact is clean and dry without any drainage, no surrounding erythema, drain output is sanguinous and minimal Card:  Irregular, rate normal Pulm:  CTA, no W/R/R, rate andeffort normal Abd: Soft, ND, +BS Skin: no rashes noted, warm and dry   Anti-infectives: Anti-infectives (From admission, onward)   Start     Dose/Rate Route Frequency Ordered Stop   07/22/17 0930  ceFAZolin (ANCEF) IVPB 2g/100 mL premix     2 g 200 mL/hr over 30 Minutes Intravenous To  [facility] 07/22/17 0925 07/22/17 1221      Lab Results:  Recent Labs    07/23/17 0501  WBC 6.7  HGB 11.6*  HCT 34.8*  PLT 180   BMET Recent Labs    07/23/17 0501  NA 134*  K 4.6  CL 99*  CO2 25  GLUCOSE 158*  BUN 17  CREATININE 0.78  CALCIUM 8.7*   PT/INR Recent Labs    07/22/17 0937  LABPROT 14.3  INR 1.11   CMP     Component Value Date/Time   NA 134 (L) 07/23/2017 0501   NA 139 02/03/2017 1011   K 4.6 07/23/2017 0501   CL 99 (L) 07/23/2017 0501   CO2 25 07/23/2017 0501   GLUCOSE 158 (H) 07/23/2017 0501   BUN 17 07/23/2017 0501   BUN 27 02/03/2017 1011   CREATININE 0.78 07/23/2017 0501   CREATININE 1.28 (H) 08/02/2016 1420   CALCIUM 8.7 (L) 07/23/2017 0501   PROT 6.6 08/19/2016 2227   ALBUMIN 3.5 08/19/2016 2227   AST 24 08/19/2016 2227   ALT 15 08/19/2016 2227   ALKPHOS 68 08/19/2016 2227   BILITOT 0.4 08/19/2016 2227   GFRNONAA >60 07/23/2017 0501   GFRAA >60 07/23/2017 0501   Lipase  No results found for: LIPASE  Studies/Results: No results found.    Kalman Drape , [facility] 07/23/2017, 8:58 AM  Pager: [phone] Mon-Wed, Friday 7:00am-4:30pm Thurs 7am-11:30am  Consults: [phone]

## 2017-07-23 NOTE — Discharge Instructions (Signed)
RESUME ELIQUIS (BLOOD THINNER) ON Sunday MAY 5TH  [facility], [place] [phone]  MASTECTOMY: POST OP INSTRUCTIONS  Always review your discharge instruction sheet given to you by the facility where your surgery was performed. IF YOU HAVE DISABILITY OR FAMILY LEAVE FORMS, YOU MUST BRING THEM TO THE OFFICE FOR PROCESSING.   DO NOT GIVE THEM TO YOUR DOCTOR. A prescription for pain medication may be given to you upon discharge.  Take your pain medication as prescribed, if needed.  If narcotic pain medicine is not needed, then you may take acetaminophen (Tylenol) or ibuprofen (Advil) as needed. 1. Take your usually prescribed medications unless otherwise directed. 2. If you need a refill on your pain medication, please contact your pharmacy.  They will contact our office to request authorization.  Prescriptions will not be filled after 5pm or on week-ends. 3. You should follow a light diet the first few days after arrival home, such as soup and crackers, etc.  Resume your normal diet the day after surgery. 4. Most patients will experience some swelling and bruising on the chest and underarm.  Ice packs will help.  Swelling and bruising can take several days to resolve.  5. It is common to experience some constipation if taking pain medication after surgery.  Increasing fluid intake and taking a stool softener (such as Colace) will usually help or prevent this problem from occurring.  A mild laxative (Milk of Magnesia or Miralax) should be taken according to package instructions if there are no bowel movements after 48 hours. 6. Unless discharge instructions indicate otherwise, leave your bandage dry and in place until your next appointment in 3-5 days.  You may take a limited sponge bath.  No tube baths or showers until the drains are removed.  You may have steri-strips (small skin tapes) in place directly over the incision.  These strips should be left on the skin for 7-10 days.  If your  surgeon used skin glue on the incision, you may shower in 24 hours.  The glue will flake off over the next 2-3 weeks.  Any sutures or staples will be removed at the office during your follow-up visit. 7. DRAINS:  If you have drains in place, it is important to keep a list of the amount of drainage produced each day in your drains.  Before leaving the hospital, you should be instructed on drain care.  Call our office if you have any questions about your drains. 8. ACTIVITIES:  You may resume regular (light) daily activities beginning the next day--such as daily self-care, walking, climbing stairs--gradually increasing activities as tolerated.  You may have sexual intercourse when it is comfortable.  Refrain from any heavy lifting or straining until approved by your doctor. a. You may drive when you are no longer taking prescription pain medication, you can comfortably wear a seatbelt, and you can safely maneuver your car and apply brakes. b. RETURN TO WORK:  __________________________________________________________ 9. You should see your doctor in the office for a follow-up appointment approximately 3-5 days after your surgery.  Your doctors nurse will typically make your follow-up appointment when she calls you with your pathology report.  Expect your pathology report 2-3 business days after your surgery.  You may call to check if you do not hear from [name] after three days.   10. OTHER INSTRUCTIONS: ______________________________________________________________________________________________ ____________________________________________________________________________________________ WHEN TO CALL YOUR DOCTOR: 1. Fever over 101.0 2. Nausea and/or vomiting 3. Extreme swelling or bruising 4. Continued bleeding from incision.  5. Increased pain, redness, or drainage from the incision. The clinic staff is available to answer your questions during regular business hours.  Please dont hesitate to call and ask  to speak to one of the nurses for clinical concerns.  If you have a medical emergency, go to the nearest emergency room or call 911.  A surgeon from [facility] is always on call at the hospital. [address], [place], [place], [place]  [address] ? [address], [place], [place]   [address] [phone] ? [phone] ? FAX [phone] Web site: [email or web address]

## 2017-07-23 NOTE — [place] FL2 (Signed)
[place] LEVEL OF CARE SCREENING TOOL     IDENTIFICATION  Patient Name: Glenda [name] Birthdate: Jan 21, 1925 Sex: female Admission Date (Current Location): 07/22/2017  [facility] and [place] Number:  Herbalist and Address:  The . [facility], [place] [address] [place], [place] [address]      Provider Number: [id number]  Attending Physician Name and Address:  Excell Seltzer, MD  Relative Name and Phone Number:  [name]; [phone]; son    Current Level of Care: Hospital Recommended Level of Care: [place] Prior Approval Number:    Date Approved/Denied:   PASRR Number:    Discharge Plan: Other (Comment)(ALF ([facility]))    Current Diagnoses: Patient Active Problem List   Diagnosis Date Noted  . Stage I breast cancer, left ([place]) 07/22/2017  . Malignant neoplasm of upper-outer quadrant of left breast in female, estrogen receptor positive ([place]) 06/27/2017  . History of cardioversion 08/20/2016  . Atrial flutter ([place])   . Shortness of breath 12/13/2014  . Palpitations 12/13/2014  . Constipation 11/06/2012  . Accelerated/malignant hypertension 09/14/2012  . Aortic stenosis, mild 07/11/2012  . Chronic diastolic HF (heart failure) ([place]) 07/11/2012  . Osteoarthritis 04/03/2012  . Anemia 06/10/2011  . Essential hypertension 03/25/2011  . Insomnia 09/15/2010  . Hypercholesterolemia 09/15/2010    Orientation RESPIRATION BLADDER Height & Weight     Time, Self, Situation, Place  Normal Continent Weight: 119 lb 9 oz (54.2 kg) Height:  5\' 1"  (154.9 cm)  BEHAVIORAL SYMPTOMS/MOOD NEUROLOGICAL BOWEL NUTRITION STATUS      Continent Diet(low cholesterol; low fat )  AMBULATORY STATUS COMMUNICATION OF NEEDS Skin   Limited Assist Verbally Surgical wounds(incision on left breast; jp drain on left lateral breast)                       Personal Care Assistance Level of Assistance  Bathing, Feeding, Dressing  Bathing Assistance: Limited assistance Feeding assistance: Independent Dressing Assistance: Limited assistance     Functional Limitations Info  Sight, Hearing, Speech Sight Info: Adequate Hearing Info: Adequate Speech Info: Adequate    SPECIAL CARE FACTORS FREQUENCY  PT (By licensed PT), OT (By licensed OT)     PT Frequency: 5x week OT Frequency: 5x week            Contractures Contractures Info: Not present    Additional Factors Info  Code Status, Allergies Code Status Info: DNR Allergies Info: ACE INHIBITORS, HALCION TRIAZOLAM, PENTAZOCINE LACTATE, SULFA DRUGS CROSS REACTORS, AMITRIPTYLINE, AVELOX MOXIFLOXACIN HCL IN NACL, PENICILLINS            Current Medications (07/23/2017):  This is the current hospital active medication list Current Facility-Administered Medications  Medication Dose Route Frequency Provider Last Rate Last Dose  . dextrose 5% in lactated ringers with KCl 20 mEq/L infusion   Intravenous Continuous Excell Seltzer, MD 50 mL/hr at 07/22/17 1754    . docusate sodium (COLACE) capsule 100 mg  100 mg Oral BID Excell Seltzer, MD   100 mg at 07/22/17 2228  . furosemide (LASIX) tablet 30 mg  30 mg Oral Daily Hoxworth, Marland Kitchen, MD      . HYDROcodone-acetaminophen (NORCO/VICODIN) 5-325 MG per tablet 1-2 tablet  1-2 tablet Oral Q4H PRN Excell Seltzer, MD   2 tablet at 07/22/17 2228  . losartan (COZAAR) tablet 100 mg  100 mg Oral Daily Hoxworth, Marland Kitchen, MD      . menthol-cetylpyridinium (CEPACOL) lozenge 3 mg  1  lozenge Oral PRN Excell Seltzer, MD      . ondansetron (ZOFRAN-ODT) disintegrating tablet 4 mg  4 mg Oral Q6H PRN Excell Seltzer, MD       Or  . ondansetron (ZOFRAN) injection 4 mg  4 mg Intravenous Q6H PRN Excell Seltzer, MD      . polyethylene glycol (MIRALAX / GLYCOLAX) packet 17 g  17 g Oral Daily Hoxworth, Marland Kitchen, MD      . temazepam (RESTORIL) capsule 15 mg  15 mg Oral QHS Excell Seltzer, MD   15 mg at 07/22/17 2228      Discharge Medications: Please see discharge summary for a list of discharge medications.  Relevant Imaging Results:  Relevant Lab Results:   Additional Information SS# [place], [place]

## 2017-07-23 NOTE — Progress Notes (Signed)
Pt discharged to home accomp by friend.  Pt is going to stay at sons home who is a [employer] scientist and he understands JP care, has record.  HHRN set up for [facility] and pt will return to [facility] next week.   FL2 form filled out by CSW and ready for [facility].  Pt given copy of instructions and a copy printed for [facility] as well.

## 2017-07-23 NOTE — Social Work (Addendum)
CSW acknowledging consults, aware pt will discharge to pt son's home and needs FL2 to return back to [facility] after that time. CSW awaits information from [facility] about sending FL2 to fax [phone].  11:20am- CSW successfully faxed FL2 to [facility], completed.   CSW signing off. Please consult if any additional needs arise.  Alexander Mt, [place] Work [phone]

## 2017-07-23 NOTE — Progress Notes (Signed)
Pt for discharge to son's home today and will stay there for a couple of days and then return to [facility], where she lives in independent living.  Family will assist in her care.  Once son arrives, I will show everyone how to empty the JP drain and go over the drainage record to take back to the doctor.  Pt is alert and oriented and has no c/o pain.  Pt is not to take Eliquis today but to resume tomorrow as per Dr. Excell Seltzer.

## 2017-07-23 NOTE — Progress Notes (Addendum)
LM with [facility] awaiting callback to see if they are contracted with a [place] agency. Spoke w [facility], they stated they have no preference in home health provider Left HIPPA compliant message with numbers below requesting call back. Reola Mosher Son [phone]    Haddock,Lori Relative [phone]  [phone]

## 2017-07-23 NOTE — Care Management Note (Signed)
Case Management Note  Patient Details  Name: Glenda [name] MRN: [id number] Date of Birth: [birth year]  Subjective/Objective:               Spoke w patient's son [name] at [phone]. He states will take patient home today and she will return to [facility] Monday. He would like to use [facility]. Referral placed to [facility] for [facility] RN as ordered. Katina instructed to call son using number above to set up [facility]. No other CM needs identified.      Action/Plan:   Expected Discharge Date:                  Expected Discharge Plan:  [place]  In-House Referral:     Discharge planning Services  CM Consult  Post Acute Care Choice:  Home Health Choice offered to:  Adult Children  DME Arranged:    DME Agency:     HH Arranged:  [place] Agency:  [facility] (formerly [employer])  Status of Service:  Completed, signed off  If discussed at [employer], dates discussed:    Additional Comments:  Carles Collet, RN 07/23/2017, 11:02 AM

## 2017-07-25 NOTE — Progress Notes (Signed)
Breast, simple mastectomy, Left Total - MULTIFOCAL INVASIVE AND IN SITU DUCTAL CARCINOMA, 3.9 AND 0.8 CM. - MARGINS NOT INVOLVED. - INVASIVE CARCINOMA 1.3 CM FROM DEEP MARGIN. - TWO SEBORRHEIC KERATOSES OF SKIN.  Placing results here for records.  We do not plan to call patient as surgery team will inform her of results

## 2017-07-27 NOTE — Discharge Summary (Signed)
  Patient ID: Glenda [name] [id number] 84 y.o. 11-19-1924  07/22/2017  Discharge date and time: 07/27/2017   Admitting Physician: Edward Jolly  Discharge Physician: Edward Jolly  Admission Diagnoses: LEFT BREAST CANCER  Discharge Diagnoses: Same  Operations: Procedure(s): Left TOTAL MASTECTOMY  Admission Condition: good  Discharged Condition: good  Indication for Admission: 84 year old female with a new diagnosis of cancer of the left breast, upper outer quadrant. T2 N0 M0, triple positive.  After preoperative work-up and consultation with the patient and her son detailed elsewhere we have elected to proceed with left total mastectomy as surgical treatment.  Hospital Course: On the morning of admission the patient underwent an uneventful left total mastectomy.  She tolerated procedure well.  The following morning she was doing well with no evidence of applications and was felt ready for discharge.   Disposition: Home  Patient Instructions:  Allergies as of 07/23/2017      Reactions   Ace Inhibitors Other (See Comments)   Cough   Halcion [triazolam]    UNSPECIFIED REACTION    Pentazocine Lactate    UNSPECIFIED REACTION    Sulfa Drugs Cross Reactors    UNSPECIFIED REACTION    Amitriptyline Other (See Comments)   Hyperactivity   Avelox [moxifloxacin Hcl In Nacl] Other (See Comments)   dizziness   Penicillins Rash      Medication List    TAKE these medications   amLODipine 5 MG tablet Commonly known as:  NORVASC Take 0.5 tablets (2.5 mg total) by mouth daily.   apixaban 2.5 MG Tabs tablet Commonly known as:  ELIQUIS Take 1 tablet (2.5 mg total) by mouth 2 (two) times daily.   CEROVITE ADVANCED FORMULA PO Take 1 tablet by mouth daily. (0800)   Cholecalciferol 1000 units tablet Take 1,000 Units by mouth daily.   Cranberry 425 MG Caps Take 425 mg by mouth 2 (two) times daily.   docusate sodium 100 MG capsule Commonly known as:  COLACE Take 100  mg by mouth 2 (two) times daily.   furosemide 20 MG tablet Commonly known as:  LASIX Take 1.5 tablets (30 mg total) by mouth daily.   HYDROcodone-acetaminophen 7.5-325 MG tablet Commonly known as:  NORCO Take 1 tablet by mouth 3 (three) times daily as needed (for pain).   losartan 100 MG tablet Commonly known as:  COZAAR Take 1 tablet (100 mg total) by mouth daily.   ondansetron 4 MG tablet Commonly known as:  ZOFRAN Take 4 mg by mouth every 6 (six) hours as needed for nausea or vomiting.   polyethylene glycol packet Commonly known as:  MIRALAX / GLYCOLAX Take 17 g by mouth daily. Taken  Daily Monday to Saturday.   temazepam 15 MG capsule Commonly known as:  RESTORIL Take 1 capsule (15 mg total) by mouth at bedtime. Should separate from hydrocodone by at least 6 hours.       Activity: activity as tolerated Diet: regular diet Wound Care: Patient and family instructed in JP drain care to empty twice daily  Follow-up:  With Dr. Excell Seltzer in 2 weeks.  Signed: Edward Jolly MD, FACS  07/27/2017, 8:17 AM

## 2017-07-29 ENCOUNTER — Telehealth: Payer: Self-pay | Admitting: Hematology and Oncology

## 2017-07-29 ENCOUNTER — Inpatient Hospital Stay: Payer: Medicare Other | Attending: Hematology and Oncology | Admitting: Hematology and Oncology

## 2017-07-29 DIAGNOSIS — Z9012 Acquired absence of left breast and nipple: Secondary | ICD-10-CM | POA: Diagnosis not present

## 2017-07-29 DIAGNOSIS — Z170 Estrogen receptor positive status [ER+]: Secondary | ICD-10-CM | POA: Insufficient documentation

## 2017-07-29 DIAGNOSIS — C50412 Malignant neoplasm of upper-outer quadrant of left female breast: Secondary | ICD-10-CM | POA: Insufficient documentation

## 2017-07-29 DIAGNOSIS — Z79899 Other long term (current) drug therapy: Secondary | ICD-10-CM | POA: Insufficient documentation

## 2017-07-29 MED ORDER — LETROZOLE 2.5 MG PO TABS: 2.5000 mg | ORAL_TABLET | Freq: Every day | ORAL | 3 refills | Status: DC

## 2017-07-29 NOTE — Assessment & Plan Note (Signed)
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  ER 100%, PR 100%, Ki-67 70%, HER-2 positive ratio 2.03, gene copy #9.25, T2 N0 stage Ib 07/22/2017:Left mastectomy: Grade 3 multifocal IDC with DCIS, 3.9 cm and 0.8 cm, margins negative, ER 100%, PR 100%, HER-2 positive ratio 2.03, T2 N0 stage Ia  Pathology counseling: I discussed the final pathology report of the patient provided  a copy of this report. I discussed the margins as well as lymph node surgeries. We also discussed the final staging along with previously performed ER/PR and HER-2/neu testing.  Recommendation:oral antiestrogen therapy with letrozole 2.5 mg daily x5 years  Even though patient is HER-2 positive, given her advanced age, I did not recommend anti-HER-2 therapy.   Return to clinic in 3 months for survivorship care plan visit

## 2017-07-29 NOTE — Progress Notes (Signed)
 Patient Care Team: Hunter, Stephen O, MD as PCP - General (Family Medicine) Brackbill, Thomas, MD (Cardiology) Whitfield, Peter W, MD (Orthopedic Surgery)  DIAGNOSIS:  Encounter Diagnosis  Name Primary?  . Malignant neoplasm of upper-outer quadrant of left breast in female, estrogen receptor positive (HCC)     SUMMARY OF ONCOLOGIC HISTORY:   Malignant neoplasm of upper-outer quadrant of left breast in female, estrogen receptor positive (HCC)   06/07/2017 Initial Diagnosis    Suspicious mass left breast 1 o'clock position at the site of the palpable lump 2.3 x 1.2 x 3.8 cm, 1 cm away is some mild satellite lesions 7 x 5 x 5 mm, no axillary lymph nodes biopsy revealed IDC with DCIS grade 3.  ER 100%, PR 100%, Ki-67 70%, HER-2 positive ratio 2.03, gene copy #9.25, T2 N0 stage Ib      07/22/2017 Surgery    Left mastectomy: Grade 3 multifocal IDC with DCIS, 3.9 cm and 0.8 cm, margins negative, ER 100%, PR 100%, HER-2 positive ratio 2.03, T2 N0 stage Ia       07/29/2017 Cancer Staging    Staging form: Breast, AJCC 8th Edition - Pathologic: Stage Unknown (pT2, pNX, cM0, G3, ER+, PR+, HER2+) - Signed by , , MD on 07/29/2017       CHIEF COMPLIANT: Follow-up after mastectomy to discuss pathology report  INTERVAL HISTORY: Cenia Z Szczesniak is a 89-year-old with above-mentioned history of left breast cancer treated with mastectomy and is here today to discuss the pathology report.  She is healing from the surgery.  She is in discomfort but it appears to be managing quite well.  REVIEW OF SYSTEMS:   Constitutional: Denies fevers, chills or abnormal weight loss Eyes: Denies blurriness of vision Ears, nose, mouth, throat, and face: Denies mucositis or sore throat Respiratory: Denies cough, dyspnea or wheezes Cardiovascular: Denies palpitation, chest discomfort Gastrointestinal:  Denies nausea, heartburn or change in bowel habits Skin: Denies abnormal skin rashes Lymphatics: Denies  new lymphadenopathy or easy bruising Neurological:Denies numbness, tingling or new weaknesses Behavioral/Psych: Mood is stable, no new changes  Extremities: No lower extremity edema Breast: Left mastectomy All other systems were reviewed with the patient and are negative.  I have reviewed the past medical history, past surgical history, social history and family history with the patient and they are unchanged from previous note.  ALLERGIES:  is allergic to ace inhibitors; halcion [triazolam]; pentazocine lactate; sulfa drugs cross reactors; amitriptyline; avelox [moxifloxacin hcl in nacl]; and penicillins.  MEDICATIONS:  Current Outpatient Medications  Medication Sig Dispense Refill  . amLODipine (NORVASC) 5 MG tablet Take 0.5 tablets (2.5 mg total) by mouth daily. 30 tablet 3  . apixaban (ELIQUIS) 2.5 MG TABS tablet Take 1 tablet (2.5 mg total) by mouth 2 (two) times daily. 60 tablet 4  . Cholecalciferol 1000 units tablet Take 1,000 Units by mouth daily.    . Cranberry 425 MG CAPS Take 425 mg by mouth 2 (two) times daily.    . docusate sodium (COLACE) 100 MG capsule Take 100 mg by mouth 2 (two) times daily.    . furosemide (LASIX) 20 MG tablet Take 1.5 tablets (30 mg total) by mouth daily. 45 tablet 3  . HYDROcodone-acetaminophen (NORCO) 7.5-325 MG tablet Take 1 tablet by mouth 3 (three) times daily as needed (for pain).     . letrozole (FEMARA) 2.5 MG tablet Take 1 tablet (2.5 mg total) by mouth daily. 90 tablet 3  . losartan (COZAAR) 100 MG tablet Take 1   tablet (100 mg total) by mouth daily. 90 tablet 3  . Multiple Vitamins-Minerals (CEROVITE ADVANCED FORMULA PO) Take 1 tablet by mouth daily. (0800)    . ondansetron (ZOFRAN) 4 MG tablet Take 4 mg by mouth every 6 (six) hours as needed for nausea or vomiting.    . polyethylene glycol (MIRALAX / GLYCOLAX) packet Take 17 g by mouth daily. Taken  Daily Monday to Saturday.    . temazepam (RESTORIL) 15 MG capsule Take 1 capsule (15 mg total)  by mouth at bedtime. Should separate from hydrocodone by at least 6 hours. 30 capsule 1   No current facility-administered medications for this visit.     PHYSICAL EXAMINATION: ECOG PERFORMANCE STATUS: 1 - Symptomatic but completely ambulatory  Vitals:   07/29/17 1024  BP: (!) 152/63  Pulse: 63  Resp: 16  Temp: 98 F (36.7 C)  SpO2: 99%   Filed Weights   07/29/17 1024  Weight: 119 lb 8 oz (54.2 kg)    GENERAL:alert, no distress and comfortable SKIN: skin color, texture, turgor are normal, no rashes or significant lesions EYES: normal, Conjunctiva are pink and non-injected, sclera clear OROPHARYNX:no exudate, no erythema and lips, buccal mucosa, and tongue normal  NECK: supple, thyroid normal size, non-tender, without nodularity LYMPH:  no palpable lymphadenopathy in the cervical, axillary or inguinal LUNGS: clear to auscultation and percussion with normal breathing effort HEART: regular rate & rhythm and no murmurs and no lower extremity edema ABDOMEN:abdomen soft, non-tender and normal bowel sounds MUSCULOSKELETAL:no cyanosis of digits and no clubbing  NEURO: alert & oriented x 3 with fluent speech, no focal motor/sensory deficits EXTREMITIES: No lower extremity edema   LABORATORY DATA:  I have reviewed the data as listed CMP Latest Ref Rng & Units 07/23/2017 07/12/2017 02/11/2017  Glucose 65 - 99 mg/dL 158(H) 88 104(H)  BUN 6 - 20 mg/dL 17 24(H) 28(H)  Creatinine 0.44 - 1.00 mg/dL 0.78 1.00 0.86  Sodium 135 - 145 mmol/L 134(L) 135 134(L)  Potassium 3.5 - 5.1 mmol/L 4.6 4.0 5.0  Chloride 101 - 111 mmol/L 99(L) 100(L) 99(L)  CO2 22 - 32 mmol/L 25 23 25  Calcium 8.9 - 10.3 mg/dL 8.7(L) 9.0 9.2  Total Protein 6.5 - 8.1 g/dL - - -  Total Bilirubin 0.3 - 1.2 mg/dL - - -  Alkaline Phos 38 - 126 U/L - - -  AST 15 - 41 U/L - - -  ALT 14 - 54 U/L - - -    Lab Results  Component Value Date   WBC 6.7 07/23/2017   HGB 11.6 (L) 07/23/2017   HCT 34.8 (L) 07/23/2017   MCV  89.9 07/23/2017   PLT 180 07/23/2017   NEUTROABS 3.5 08/19/2016    ASSESSMENT & PLAN:  Malignant neoplasm of upper-outer quadrant of left breast in female, estrogen receptor positive (HCC) 06/07/2017 suspicious mass left breast 1 o'clock position at the site of the palpable lump 2.3 x 1.2 x 3.8 cm, 1 cm away is some mild satellite lesions 7 x 5 x 5 mm, no axillary lymph nodes biopsy revealed IDC with DCIS grade 3.  ER 100%, PR 100%, Ki-67 70%, HER-2 positive ratio 2.03, gene copy #9.25, T2 N0 stage Ib 07/22/2017:Left mastectomy: Grade 3 multifocal IDC with DCIS, 3.9 cm and 0.8 cm, margins negative, ER 100%, PR 100%, HER-2 positive ratio 2.03, T2 N0 stage Ia  Pathology counseling: I discussed the final pathology report of the patient provided  a copy of this report. I discussed   the margins as well as lymph node surgeries. We also discussed the final staging along with previously performed ER/PR and HER-2/neu testing.  Recommendation:oral antiestrogen therapy with letrozole 2.5 mg daily x5 years  Even though patient is HER-2 positive, given her advanced age, I did not recommend anti-HER-2 therapy.   Return to clinic in 3 months for survivorship care plan visit    No orders of the defined types were placed in this encounter.  The patient has a good understanding of the overall plan. she agrees with it. she will call with any problems that may develop before the next visit here.   Viinay K , MD 07/29/17    

## 2017-07-29 NOTE — Telephone Encounter (Signed)
Gave patient AVs and calendar of upcoming august appointments.  °

## 2017-08-11 ENCOUNTER — Ambulatory Visit (INDEPENDENT_AMBULATORY_CARE_PROVIDER_SITE_OTHER): Payer: Medicare Other | Admitting: Family Medicine

## 2017-08-11 ENCOUNTER — Encounter: Payer: Self-pay | Admitting: Family Medicine

## 2017-08-11 VITALS — BP 134/70 | HR 54 | Temp 98.0°F | Ht 61.0 in | Wt 119.0 lb

## 2017-08-11 DIAGNOSIS — Z79899 Other long term (current) drug therapy: Secondary | ICD-10-CM | POA: Diagnosis not present

## 2017-08-11 DIAGNOSIS — I10 Essential (primary) hypertension: Secondary | ICD-10-CM

## 2017-08-11 DIAGNOSIS — M159 Polyosteoarthritis, unspecified: Secondary | ICD-10-CM | POA: Diagnosis not present

## 2017-08-11 DIAGNOSIS — F1190 Opioid use, unspecified, uncomplicated: Secondary | ICD-10-CM

## 2017-08-11 DIAGNOSIS — G4700 Insomnia, unspecified: Secondary | ICD-10-CM

## 2017-08-11 MED ORDER — HYDROCODONE-ACETAMINOPHEN 5-325 MG PO TABS: 1.0000 | ORAL_TABLET | Freq: Three times a day (TID) | ORAL | 0 refills | Status: DC | PRN

## 2017-08-11 NOTE — Assessment & Plan Note (Signed)
S: controlled on lasix 30mg , losartan 50mg --> 100mg  (last visit), amlodipien 2.5mg .  . Very variable BP in general though.  BP Readings from Last 3 Encounters:  08/11/17 134/70  07/29/17 (!) 152/63  07/23/17 (!) 171/69  A/P: We discussed blood pressure goal of <140/90. Continue current meds

## 2017-08-11 NOTE — Assessment & Plan Note (Signed)
S:  Patient remains on temazepam 15mg - started by cardiology in 2012 when poor sleep seemed to provoke palpitations/PVCs.   Last visit we thought her provider at [facility] was providing but we have changed to [name] prescribing this. We have already reviewed risks at last visit with narcotics/benzos plus her age- see that note plus phone note- we reinforced today.  A/P:continue temazepam but separated by 6 hours from last dose of narcotic-wonder about trying trazodone potentially

## 2017-08-11 NOTE — Progress Notes (Signed)
Subjective:  Glenda [name] is a 84 y.o. year old very pleasant female patient who presents for/with See problem oriented charting ROS-healing from recent surgery-some left chest pain.  No shortness of breath.  Has continued low back pain for which she uses narcotics.  Some mild constipation.  Past Medical History-  Patient Active Problem List   Diagnosis Date Noted  . Malignant neoplasm of upper-outer quadrant of left breast in female, estrogen receptor positive ([place]) 06/27/2017    Priority: High  . Atrial flutter ([place])     Priority: High  . Chronic diastolic HF (heart failure) ([place]) 07/11/2012    Priority: High  . Osteoarthritis 04/03/2012    Priority: High  . Shortness of breath 12/13/2014    Priority: Medium  . Aortic stenosis, mild 07/11/2012    Priority: Medium  . Essential hypertension 03/25/2011    Priority: Medium  . Insomnia 09/15/2010    Priority: Medium  . Hypercholesterolemia 09/15/2010    Priority: Medium  . History of cardioversion 08/20/2016    Priority: Low  . Palpitations 12/13/2014    Priority: Low  . Constipation 11/06/2012    Priority: Low  . Accelerated/malignant hypertension 09/14/2012    Priority: Low  . Anemia 06/10/2011    Priority: Low  . Chronic narcotic use 08/11/2017  . Stage I breast cancer, left ([place]) 07/22/2017    Medications- reviewed and updated Current Outpatient Medications  Medication Sig Dispense Refill  . amLODipine (NORVASC) 5 MG tablet Take 0.5 tablets (2.5 mg total) by mouth daily. 30 tablet 3  . apixaban (ELIQUIS) 2.5 MG TABS tablet Take 1 tablet (2.5 mg total) by mouth 2 (two) times daily. 60 tablet 4  . Cholecalciferol 1000 units tablet Take 1,000 Units by mouth daily.    . Cranberry 425 MG CAPS Take 425 mg by mouth 2 (two) times daily.    Marland Kitchen docusate sodium (COLACE) 100 MG capsule Take 100 mg by mouth 2 (two) times daily.    . furosemide (LASIX) 20 MG tablet Take 1.5 tablets (30 mg total) by mouth daily. 45 tablet 3  .  HYDROcodone-acetaminophen (NORCO/VICODIN) 5-325 MG tablet Take 1 tablet by mouth 3 (three) times daily as needed for moderate pain or severe pain (separate from temazepam by 6 hr. fill today). 90 tablet 0  . HYDROcodone-acetaminophen (NORCO/VICODIN) 5-325 MG tablet Take 1 tablet by mouth 3 (three) times daily as needed for moderate pain or severe pain (separate by 6 hr from temazepam. fill in 1 month). 90 tablet 0  . HYDROcodone-acetaminophen (NORCO/VICODIN) 5-325 MG tablet Take 1 tablet by mouth 3 (three) times daily as needed for moderate pain or severe pain (separate by 6 hr from temazepam. fill in 2 months). 90 tablet 0  . letrozole (FEMARA) 2.5 MG tablet Take 1 tablet (2.5 mg total) by mouth daily. 90 tablet 3  . losartan (COZAAR) 100 MG tablet Take 1 tablet (100 mg total) by mouth daily. 90 tablet 3  . Multiple Vitamins-Minerals (CEROVITE ADVANCED FORMULA PO) Take 1 tablet by mouth daily. (0800)    . polyethylene glycol (MIRALAX / GLYCOLAX) packet Take 17 g by mouth daily. Taken  Daily Monday to Saturday.    . temazepam (RESTORIL) 15 MG capsule Take 1 capsule (15 mg total) by mouth at bedtime. Should separate from hydrocodone by at least 6 hours. 30 capsule 1   No current facility-administered medications for this visit.     Objective: BP 134/70 (BP Location: Left Arm, Patient Position: Sitting, Cuff Size: Normal)  Pulse (!) 54   Temp 98 F (36.7 C) (Oral)   Ht 5\' 1"  (1.549 m)   Wt 119 lb (54 kg)   SpO2 97%   BMI 22.48 kg/m  Gen: NAD, resting comfortably CV: RRR- slightly bradycardic though- no murmurs rubs or gallops Lungs: CTAB no crackles, wheeze, rhonchi Abdomen: soft/nontender/nondistended/normal bowel sounds. Ext: no edema Skin: warm, dry Neuro: walks with walker  Assessment/Plan:  Other notes: 1. she declines prevnar 13 again- hopefully give at next visit  Osteoarthritis S: Patient with OA of fingers, low back. She has been on norco for some time - she has seen Dr.  Mare Ferrari of cardiology who prescribed in past and then later [facility] proider Wynn Maudlin- I agreed to prescribe as she does not want to go to pain management - condition for me prescribing is for her to use norco 5mg  dose only twice a day and to separate last dose by 6 hours from temazepam.  A/P: I will assume prescription of narcotics.  We discussed every 63-month visits.  We discussed going from Norco 7.5 mg 3 times daily per prior providers down to 5 mg BID - norco 5mg  to [facility] rx  - NCCSRS reviewed- prior rx through prior PCP - UDS today -controlled substance contract filled out today with [employer]'s assistance- to be scanned in  Chronic narcotic use See OA section  Insomnia S:  Patient remains on temazepam 15mg - started by cardiology in 2012 when poor sleep seemed to provoke palpitations/PVCs.   Last visit we thought her provider at [facility] was providing but we have changed to [name] prescribing this. We have already reviewed risks at last visit with narcotics/benzos plus her age- see that note plus phone note- we reinforced today.  A/P:continue temazepam but separated by 6 hours from last dose of narcotic-wonder about trying trazodone potentially   Essential hypertension S: controlled on lasix 30mg , losartan 50mg --> 100mg  (last visit), amlodipien 2.5mg .  . Very variable BP in general though.  BP Readings from Last 3 Encounters:  08/11/17 134/70  07/29/17 (!) 152/63  07/23/17 (!) 171/69  A/P: We discussed blood pressure goal of <140/90. Continue current meds  Future Appointments  Date Time Provider [place]  09/20/2017 10:20 AM Lelon Perla, MD [facility]  11/07/2017 10:00 AM Causey, Charlestine Massed, NP [facility] None   Advised her to schedule 3 month follow up  Lab/Order associations: High risk medication use - Plan: Pain Mgmt, Profile 8 w/Conf, U  Meds ordered this encounter  Medications  . HYDROcodone-acetaminophen (NORCO/VICODIN) 5-325 MG  tablet    Sig: Take 1 tablet by mouth 3 (three) times daily as needed for moderate pain or severe pain (separate from temazepam by 6 hr. fill today).    Dispense:  90 tablet    Refill:  0  . HYDROcodone-acetaminophen (NORCO/VICODIN) 5-325 MG tablet    Sig: Take 1 tablet by mouth 3 (three) times daily as needed for moderate pain or severe pain (separate by 6 hr from temazepam. fill in 1 month).    Dispense:  90 tablet    Refill:  0  . HYDROcodone-acetaminophen (NORCO/VICODIN) 5-325 MG tablet    Sig: Take 1 tablet by mouth 3 (three) times daily as needed for moderate pain or severe pain (separate by 6 hr from temazepam. fill in 2 months).    Dispense:  90 tablet    Refill:  0    Return precautions advised.  Garret Reddish, MD

## 2017-08-11 NOTE — Assessment & Plan Note (Signed)
S: Patient with OA of fingers, low back. She has been on norco for some time - she has seen Dr. Mare Ferrari of cardiology who prescribed in past and then later [facility] proider Wynn Maudlin- I agreed to prescribe as she does not want to go to pain management - condition for me prescribing is for her to use norco 5mg  dose only twice a day and to separate last dose by 6 hours from temazepam.  A/P: I will assume prescription of narcotics.  We discussed every 34-month visits.  We discussed going from Norco 7.5 mg 3 times daily per prior providers down to 5 mg BID - norco 5mg  to [facility] rx  - NCCSRS reviewed- prior rx through prior PCP - UDS today -controlled substance contract filled out today with [employer]'s assistance- to be scanned in

## 2017-08-11 NOTE — Patient Instructions (Addendum)
Health Maintenance Due  Topic Date Due  . PNA vac Low Risk Adult (2 of 2 - PCV13) - Patient just had surgery so she wants to wait a while before she gets it. 03/24/2012   Reduce hydrocodone pills to 5mg  three times a day. Hoping this helps with nausea. We will try to go to twice a day next visit if possible  See me in 3 months  No other changes today- blood pressure looks good today  Garret Reddish

## 2017-08-11 NOTE — Assessment & Plan Note (Signed)
- 

## 2017-08-12 ENCOUNTER — Telehealth: Payer: Self-pay

## 2017-08-12 NOTE — Telephone Encounter (Signed)
Copied from [place] [id number]. Topic: General - Other >> Aug 12, 2017 10:32 AM Arletha Grippe wrote: Reason for CRM: home health will be faxing over a clarification of order.  They need to know the hourly frequency of the pt's hydrocodone.  They need to have this completed and returned as soon as possible. No cb needed

## 2017-08-12 NOTE — Telephone Encounter (Signed)
Noted. Will look for paperwork.

## 2017-08-18 LAB — PAIN MGMT, PROFILE 8 W/CONF, U
6 Acetylmorphine: NEGATIVE ng/mL (ref <10)
Alcohol Metabolites: NEGATIVE ng/mL (ref <500)
Alphahydroxyalprazolam: NEGATIVE ng/mL (ref <25)
Alphahydroxymidazolam: NEGATIVE ng/mL (ref <50)
Alphahydroxytriazolam: NEGATIVE ng/mL (ref <50)
Aminoclonazepam: NEGATIVE ng/mL (ref <25)
Amphetamines: NEGATIVE ng/mL (ref <500)
Benzodiazepines: POSITIVE ng/mL — AB (ref <100)
Buprenorphine, Urine: NEGATIVE ng/mL (ref <5)
Cocaine Metabolite: NEGATIVE ng/mL (ref <150)
Codeine: NEGATIVE ng/mL (ref <50)
Creatinine: 35.2 mg/dL
Hydrocodone: 824 ng/mL — ABNORMAL HIGH (ref <50)
Hydromorphone: NEGATIVE ng/mL (ref <50)
Hydroxyethylflurazepam: NEGATIVE ng/mL (ref <50)
Lorazepam: NEGATIVE ng/mL (ref <50)
MDMA: NEGATIVE ng/mL (ref <500)
Marijuana Metabolite: NEGATIVE ng/mL (ref <20)
Morphine: NEGATIVE ng/mL (ref <50)
Nordiazepam: NEGATIVE ng/mL (ref <50)
Norhydrocodone: 1418 ng/mL — ABNORMAL HIGH (ref <50)
Opiates: POSITIVE ng/mL — AB (ref <100)
Oxazepam: 288 ng/mL — ABNORMAL HIGH (ref <50)
Oxidant: NEGATIVE ug/mL (ref <200)
Oxycodone: NEGATIVE ng/mL (ref <100)
Temazepam: 7970 ng/mL — ABNORMAL HIGH (ref <50)
pH: 6.56 (ref 4.5–9.0)

## 2017-09-12 NOTE — Progress Notes (Signed)
HPI: FU hypertension and atrial flutter. History of atypical chest pain and palpitations. She had a exercise treadmill test on 01/26/11 which showed no evidence of ischemia. 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 Admitted July 2017 with atrial flutter vs atrial tachycardia and underwent TEE guided cardioversion. Echocardiogram October 2017 showed normal LV function, mild aortic insufficiency and mitral regurgitation and mild left atrial enlargement.Patient had recurrent atrial flutter May 2018 and had repeat cardioversion. Appointment was scheduled to see Dr. Lovena Le for consideration of ablation but he was not availableandseen [facility] [place]; she was inclined not to pursue procedures. Monitor March 2019 showed sinus with PACs and PVCs but no atrial flutter.  Since last seen, she has occasional brief chest pain not sustained.  Continues with palpitations.  No syncope.  Mild dyspnea on exertion but no orthopnea, PND or pedal edema.  Current Outpatient Medications  Medication Sig Dispense Refill  . amLODipine (NORVASC) 2.5 MG tablet Take 2.5 mg by mouth daily.    Marland Kitchen apixaban (ELIQUIS) 2.5 MG TABS tablet Take 1 tablet (2.5 mg total) by mouth 2 (two) times daily. 60 tablet 4  . benzonatate (TESSALON) 100 MG capsule Take one (1) to two (2) capsules by mouth every eight (8) hours as needed for cough.    . Cholecalciferol 1000 units tablet Take 1,000 Units by mouth daily.    . Cranberry 425 MG CAPS Take 425 mg by mouth 2 (two) times daily.    Marland Kitchen docusate sodium (COLACE) 100 MG capsule Take 100 mg by mouth 2 (two) times daily.    . furosemide (LASIX) 20 MG tablet Take 1.5 tablets (30 mg total) by mouth daily. 45 tablet 3  . HYDROcodone-acetaminophen (NORCO/VICODIN) 5-325 MG tablet Take 1 tablet by mouth 3 (three) times daily as needed for moderate pain or severe pain (separate from temazepam by 6 hr. fill today). 90 tablet 0  . HYDROcodone-acetaminophen (NORCO/VICODIN) 5-325 MG tablet Take 1 tablet by mouth 3 (three)  times daily as needed for moderate pain or severe pain (separate by 6 hr from temazepam. fill in 1 month). 90 tablet 0  . HYDROcodone-acetaminophen (NORCO/VICODIN) 5-325 MG tablet Take 1 tablet by mouth 3 (three) times daily as needed for moderate pain or severe pain (separate by 6 hr from temazepam. fill in 2 months). 90 tablet 0  . letrozole (FEMARA) 2.5 MG tablet Take 1 tablet (2.5 mg total) by mouth daily. 90 tablet 3  . losartan (COZAAR) 100 MG tablet Take 1 tablet (100 mg total) by mouth daily. 90 tablet 3  . Multiple Vitamins-Minerals (CEROVITE ADVANCED FORMULA PO) Take 1 tablet by mouth daily. (0800)    . omeprazole (PRILOSEC) 40 MG capsule Take 40 mg by mouth daily.    . ondansetron (ZOFRAN) 4 MG tablet Take 4 mg by mouth every 6 (six) hours as needed for nausea.    . polyethylene glycol (MIRALAX / GLYCOLAX) packet Take 17 g by mouth daily. Taken  Daily Monday to Saturday.    . temazepam (RESTORIL) 15 MG capsule Take 1 capsule (15 mg total) by mouth at bedtime. Should separate from hydrocodone by at least 6 hours. 30 capsule 1   No current facility-administered medications for this visit.      Past Medical History:  Diagnosis Date  . Aortic stenosis    Echo 02/2011 showing mild AS with normal LV systolic function  . Arthritis    "lower back" (10/09/2015)  . Atrial flutter with rapid ventricular response ([place]) 08/19/2016  s/p successful DCCV on 08/20/16, continue eliquis  . Chronic bronchitis ([place])    "off and on; several years" (10/09/2015)  . Chronic diastolic CHF (congestive heart failure) ([place])    a. 12/2015 Echo: EF 55-60%, mild AI/MR, mildly dil LA.  Marland Kitchen Chronic lower back pain   . Complication of anesthesia    "they had trouble waking me up after colon resection"  . DDD (degenerative disc disease), lumbar    severe facet dz and adv DDD MRI L spine 2009  . Diverticulosis   . GERD (gastroesophageal reflux disease)   . Hiatal hernia    hx  . Hypercholesterolemia   .  Hypertension   . Insomnia   . OA (osteoarthritis) of knee   . Paroxysmal atrial flutter ([place])    a. 09/2015 s/p TEE/DCCV;  b. CHA2DS2VASc = 5-->Eliquis.  . Peripheral edema   . PVCs (premature ventricular contractions)   . Sacral fracture ([place]) 11/06/2012  . TIA (transient ischemic attack)    pt denies this hx on 10/09/2015 "it was the [place] I was taking; they had thought I was having a stroke"  . Vertigo 11/19/2012    Past Surgical History:  Procedure Laterality Date  . BREAST BIOPSY Left   . CARDIAC CATHETERIZATION  02/17/2001   MILD REGURGITATION. EF 60%  . CARDIOVERSION N/A 10/08/2015   Procedure: CARDIOVERSION;  Surgeon: Sanda Klein, MD;  Location: [facility] ENDOSCOPY;  Service: Cardiovascular;  Laterality: N/A;  . CARDIOVERSION N/A 08/20/2016   Procedure: Cardioversion;  Surgeon: Evans Lance, MD;  Location: [facility] CV LAB;  Service: Cardiovascular;  Laterality: N/A;  . CATARACT EXTRACTION W/ INTRAOCULAR LENS  IMPLANT, BILATERAL Bilateral   . CESAREAN SECTION  1954  . COLECTOMY     related to "blockage"  . COLONOSCOPY    . EXCISIONAL HEMORRHOIDECTOMY    . INGUINAL HERNIA REPAIR Bilateral   . KYPHOPLASTY Right 09/11/2012   Procedure: Right Acrylic Sacroplasty;  Surgeon: Kristeen Miss, MD;  Location: [facility] NEURO ORS;  Service: Neurosurgery;  Laterality: Right;  Right  Acrylic Sacroplasty  . TEE WITHOUT CARDIOVERSION N/A 10/08/2015   Procedure: TRANSESOPHAGEAL ECHOCARDIOGRAM (TEE);  Surgeon: Sanda Klein, MD;  Location[facility] ENDOSCOPY;  Service: Cardiovascular;  Laterality: N/A;  . TOTAL KNEE ARTHROPLASTY Right   . TOTAL MASTECTOMY Left 07/22/2017   Procedure: TOTAL MASTECTOMY;  Surgeon: Excell Seltzer, MD;  Location: [facility];  Service: General;  Laterality: Left;  . UMBILICAL HERNIA REPAIR      Social History   Socioeconomic History  . Marital status: Widowed    Spouse name: Not on file  . Number of children: Not on file  . Years of education: Not on file  . Highest education  level: Not on file  Occupational History  . Not on file  Social Needs  . Financial resource strain: Not on file  . Food insecurity:    Worry: Not on file    Inability: Not on file  . Transportation needs:    Medical: Not on file    Non-medical: Not on file  Tobacco Use  . Smoking status: Never Smoker  . Smokeless tobacco: Never Used  Substance and Sexual Activity  . Alcohol use: No  . Drug use: No  . Sexual activity: Never  Lifestyle  . Physical activity:    Days per week: Not on file    Minutes per session: Not on file  . Stress: Not on file  Relationships  . Social connections:    Talks on phone:  Not on file    Gets together: Not on file    Attends religious service: Not on file    Active member of club or organization: Not on file    Attends meetings of clubs or organizations: Not on file    Relationship status: Not on file  . Intimate partner violence:    Fear of current or ex partner: Not on file    Emotionally abused: Not on file    Physically abused: Not on file    Forced sexual activity: Not on file  Other Topics Concern  . 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 Heart disease Mother   . Hypertension Father   . Other Sister        18 in 2019  . Other Sister        80 in 2019  . Heart disease Sister   . Heart attack Neg Hx   . Stroke Neg Hx     ROS: Chronic back pain but no fevers or chills, productive cough, hemoptysis, dysphasia, odynophagia, melena, hematochezia, dysuria, hematuria, rash, seizure activity, orthopnea, PND, pedal edema, claudication. Remaining systems are  negative.  Physical Exam: Well-developed well-nourished in no acute distress.  Skin is warm and dry.  HEENT is normal.  Neck is supple.  Chest is clear to auscultation with normal expansion.  Status post mastectomy. Cardiovascular exam is regular rate and rhythm.  2/6 systolic murmur. Abdominal exam nontender or distended. No masses palpated. Extremities show no edema. neuro grossly intact   A/P  1 history of atrial flutter-she remains in sinus rhythm on examination.  Her beta-blocker was discontinued previously because of bradycardia.  We will continue with present dose of apixaban.  2 history of mild aortic stenosis-not evident on most recent echocardiogram.  3 hypertension-blood pressure is mildly elevated.  Increase amlodipine to 5 mg daily and follow.  4 hyperlipidemia-continue statin.  5 palpitations-patient describes palpitations.  Previous monitor showed PACs and PVCs but no atrial flutter.  I will avoid beta blockade as it did cause bradycardia previously.  6 chronic diastolic congestive heart failure-patient is euvolemic.  Continue present dose of diuretic.  Kirk Ruths, MD

## 2017-09-20 ENCOUNTER — Encounter: Payer: Self-pay | Admitting: Cardiology

## 2017-09-20 ENCOUNTER — Ambulatory Visit (INDEPENDENT_AMBULATORY_CARE_PROVIDER_SITE_OTHER): Payer: Medicare Other | Admitting: Cardiology

## 2017-09-20 VITALS — BP 179/67 | HR 47 | Ht 61.0 in | Wt 121.8 lb

## 2017-09-20 DIAGNOSIS — I10 Essential (primary) hypertension: Secondary | ICD-10-CM

## 2017-09-20 DIAGNOSIS — I4892 Unspecified atrial flutter: Secondary | ICD-10-CM

## 2017-09-20 DIAGNOSIS — E7800 Pure hypercholesterolemia, unspecified: Secondary | ICD-10-CM | POA: Diagnosis not present

## 2017-09-20 MED ORDER — AMLODIPINE BESYLATE 5 MG PO TABS: 5.0000 mg | ORAL_TABLET | Freq: Every day | ORAL | 3 refills | Status: DC

## 2017-09-20 NOTE — Patient Instructions (Signed)
Medication Instructions:   INCREASE AMLODIPINE TO 5 MG ONCE DAILY= 2 OF THE 2.5 MG TABLETS ONCE DAILY  Follow-Up:  Your physician wants you to follow-up in: 6 MONTHS WITH DR CRENSHAW You will receive a reminder letter in the mail two months in advance. If you don't receive a letter, please call our office to schedule the follow-up appointment.   If you need a refill on your cardiac medications before your next appointment, please call your pharmacy.    

## 2017-09-27 NOTE — Telephone Encounter (Signed)
Teressa with [facility] called in to receive orders. She said the order that was sent back in May is still not worded so that they are able to use. She said that they are unable to use a range of hours that pt can use medication it has to be specific, example: 3 times a day; 4 times a day. She would like to have an updated order re-faxed to them at: [phone]    Phone: [phone] if needed.

## 2017-09-30 ENCOUNTER — Encounter

## 2017-10-10 NOTE — Telephone Encounter (Signed)
Spoke to [name] at [facility], asked her if they received medication orders for pt. Glenda [name] said no, pt is taking Norco twice a day as needed. Told her okay can you fax new order over to me and I will have Dr. Yong Channel sign and fax back. Fax number given. Glenda [name] verbalized understanding. Awaiting fax.

## 2017-10-12 ENCOUNTER — Other Ambulatory Visit: Payer: Self-pay | Admitting: Family Medicine

## 2017-10-12 NOTE — Telephone Encounter (Signed)
Copied from [place] [id number]. Topic: Quick Communication - Rx Refill/Question >> Oct 12, 2017  9:44 AM Antonieta Iba C wrote: Medication: temazepam (RESTORIL) 15 MG capsule  Has the patient contacted their pharmacy? yes (Agent: If no, request that the patient contact the pharmacy for the refill.) (Agent: If yes, when and what did the pharmacy advise?)  Preferred Pharmacy (with phone number or street name): [facility] - [place], [place] - [address] [phone] (Phone) [phone] (Fax)      Agent: Please be advised that RX refills may take up to 3 business days. We ask that you follow-up with your pharmacy.

## 2017-10-13 MED ORDER — TEMAZEPAM 15 MG PO CAPS: 15.0000 mg | ORAL_CAPSULE | Freq: Every day | ORAL | 2 refills | Status: DC

## 2017-10-13 NOTE — Telephone Encounter (Signed)
Refill of restoril  LRF 07/15/17  #30  1 refill  LOV 08/11/17 Dr. Raymon Mutton - [place], [place] [facility] [address]           [phone] (Phone) [phone] (Fax)

## 2017-11-07 ENCOUNTER — Inpatient Hospital Stay: Payer: Medicare Other | Attending: Hematology and Oncology | Admitting: Adult Health

## 2017-11-07 NOTE — Progress Notes (Deleted)
CLINIC:  Survivorship   REASON FOR VISIT:  Routine follow-up post-treatment for a recent history of breast cancer.  BRIEF ONCOLOGIC HISTORY:    Malignant neoplasm of upper-outer quadrant of left breast in female, estrogen receptor positive ([place])   06/07/2017 Initial Diagnosis    Suspicious mass left breast 1 o'clock position at the site of the palpable lump 2.3 x 1.2 x 3.8 cm, 1 cm away is some mild satellite lesions 7 x 5 x 5 mm, no axillary lymph nodes biopsy revealed IDC with DCIS grade 3.  ER 100%, PR 100%, Ki-67 70%, HER-2 positive ratio 2.03, gene copy #9.25, T2 N0 stage Ib    07/22/2017 Surgery    Left mastectomy: Grade 3 multifocal IDC with DCIS, 3.9 cm and 0.8 cm, margins negative, ER 100%, PR 100%, HER-2 positive ratio 2.03, T2 N0 stage Ia     07/2017 -  Anti-estrogen oral therapy    Letrozole daily     INTERVAL HISTORY:  Ms. Farrelly presents to the [facility] today for our initial meeting to review her survivorship care plan detailing her treatment course for breast cancer, as well as monitoring long-term side effects of that treatment, education regarding health maintenance, screening, and overall wellness and health promotion.     Overall, Ms. Keitt reports feeling quite well since completing her radiation therapy approximately 3 months ago.  She ***    REVIEW OF SYSTEMS:  Review of Systems - Oncology Breast: Denies any new nodularity, masses, tenderness, nipple changes, or nipple discharge.      ONCOLOGY TREATMENT TEAM:  1. Surgeon:  Dr. Excell Seltzer at [facility] 2. Medical Oncologist: Dr. Lindi Adie    PAST MEDICAL/SURGICAL HISTORY:  Past Medical History:  Diagnosis Date  . Aortic stenosis    Echo 02/2011 showing mild AS with normal LV systolic function  . Arthritis    "lower back" (10/09/2015)  . Atrial flutter with rapid ventricular response ([place]) 08/19/2016   s/p successful DCCV on 08/20/16, continue eliquis  . Chronic bronchitis ([place])     "off and on; several years" (10/09/2015)  . Chronic diastolic CHF (congestive heart failure) ([place])    a. 12/2015 Echo: EF 55-60%, mild AI/MR, mildly dil LA.  Marland Kitchen Chronic lower back pain   . Complication of anesthesia    "they had trouble waking me up after colon resection"  . DDD (degenerative disc disease), lumbar    severe facet dz and adv DDD MRI L spine 2009  . Diverticulosis   . GERD (gastroesophageal reflux disease)   . Hiatal hernia    hx  . Hypercholesterolemia   . Hypertension   . Insomnia   . OA (osteoarthritis) of knee   . Paroxysmal atrial flutter ([place])    a. 09/2015 s/p TEE/DCCV;  b. CHA2DS2VASc = 5-->Eliquis.  . Peripheral edema   . PVCs (premature ventricular contractions)   . Sacral fracture ([place]) 11/06/2012  . TIA (transient ischemic attack)    pt denies this hx on 10/09/2015 "it was the [place] I was taking; they had thought I was having a stroke"  . Vertigo 11/19/2012   Past Surgical History:  Procedure Laterality Date  . BREAST BIOPSY Left   . CARDIAC CATHETERIZATION  02/17/2001   MILD REGURGITATION. EF 60%  . CARDIOVERSION N/A 10/08/2015   Procedure: CARDIOVERSION;  Surgeon: Sanda Klein, MD;  Location: [facility] ENDOSCOPY;  Service: Cardiovascular;  Laterality: N/A;  . CARDIOVERSION N/A 08/20/2016   Procedure: Cardioversion;  Surgeon: Evans Lance, MD;  Location:  [facility] INVASIVE CV LAB;  Service: Cardiovascular;  Laterality: N/A;  . CATARACT EXTRACTION W/ INTRAOCULAR LENS  IMPLANT, BILATERAL Bilateral   . CESAREAN SECTION  1954  . COLECTOMY     related to "blockage"  . COLONOSCOPY    . EXCISIONAL HEMORRHOIDECTOMY    . INGUINAL HERNIA REPAIR Bilateral   . KYPHOPLASTY Right 09/11/2012   Procedure: Right Acrylic Sacroplasty;  Surgeon: Kristeen Miss, MD;  Location: [facility] NEURO ORS;  Service: Neurosurgery;  Laterality: Right;  Right  Acrylic Sacroplasty  . TEE WITHOUT CARDIOVERSION N/A 10/08/2015   Procedure: TRANSESOPHAGEAL ECHOCARDIOGRAM (TEE);  Surgeon: Sanda Klein,  MD;  Location: [facility] ENDOSCOPY;  Service: Cardiovascular;  Laterality: N/A;  . TOTAL KNEE ARTHROPLASTY Right   . TOTAL MASTECTOMY Left 07/22/2017   Procedure: TOTAL MASTECTOMY;  Surgeon: Excell Seltzer, MD;  Location: [facility];  Service: General;  Laterality: Left;  . UMBILICAL HERNIA REPAIR       ALLERGIES:  Allergies  Allergen Reactions  . Ace Inhibitors Other (See Comments)    Cough  . Halcion [Triazolam]     UNSPECIFIED REACTION   . Pentazocine Lactate     UNSPECIFIED REACTION   . Sulfa Drugs Cross Reactors     UNSPECIFIED REACTION   . Amitriptyline Other (See Comments)    Hyperactivity  . Avelox [Moxifloxacin Hcl In Nacl] Other (See Comments)    dizziness  . Penicillins Rash     CURRENT MEDICATIONS:  Outpatient Encounter Medications as of 11/07/2017  Medication Sig  . amLODipine (NORVASC) 5 MG tablet Take 1 tablet (5 mg total) by mouth daily.  Marland Kitchen apixaban (ELIQUIS) 2.5 MG TABS tablet Take 1 tablet (2.5 mg total) by mouth 2 (two) times daily.  . benzonatate (TESSALON) 100 MG capsule Take one (1) to two (2) capsules by mouth every eight (8) hours as needed for cough.  . Cholecalciferol 1000 units tablet Take 1,000 Units by mouth daily.  . Cranberry 425 MG CAPS Take 425 mg by mouth 2 (two) times daily.  Marland Kitchen docusate sodium (COLACE) 100 MG capsule Take 100 mg by mouth 2 (two) times daily.  . furosemide (LASIX) 20 MG tablet Take 1.5 tablets (30 mg total) by mouth daily.  Marland Kitchen HYDROcodone-acetaminophen (NORCO/VICODIN) 5-325 MG tablet Take 1 tablet by mouth 3 (three) times daily as needed for moderate pain or severe pain (separate from temazepam by 6 hr. fill today).  Marland Kitchen HYDROcodone-acetaminophen (NORCO/VICODIN) 5-325 MG tablet Take 1 tablet by mouth 3 (three) times daily as needed for moderate pain or severe pain (separate by 6 hr from temazepam. fill in 1 month).  Marland Kitchen HYDROcodone-acetaminophen (NORCO/VICODIN) 5-325 MG tablet Take 1 tablet by mouth 3 (three) times daily as needed for moderate  pain or severe pain (separate by 6 hr from temazepam. fill in 2 months).  . letrozole (FEMARA) 2.5 MG tablet Take 1 tablet (2.5 mg total) by mouth daily.  Marland Kitchen losartan (COZAAR) 100 MG tablet Take 1 tablet (100 mg total) by mouth daily.  . Multiple Vitamins-Minerals (CEROVITE ADVANCED FORMULA PO) Take 1 tablet by mouth daily. (0800)  . omeprazole (PRILOSEC) 40 MG capsule Take 40 mg by mouth daily.  . ondansetron (ZOFRAN) 4 MG tablet Take 4 mg by mouth every 6 (six) hours as needed for nausea.  . polyethylene glycol (MIRALAX / GLYCOLAX) packet Take 17 g by mouth daily. Taken  Daily Monday to Saturday.  . temazepam (RESTORIL) 15 MG capsule Take 1 capsule (15 mg total) by mouth at bedtime. Should separate from hydrocodone by  at least 6 hours.   No facility-administered encounter medications on file as of 11/07/2017.      ONCOLOGIC FAMILY HISTORY:  Family History  Problem Relation Age of Onset  . Heart disease Mother   . Hypertension Father   . Other Sister        71 in 2019  . Other Sister        5 in 2019  . Heart disease Sister   . Heart attack Neg Hx   . Stroke Neg Hx      GENETIC COUNSELING/TESTING: ***  SOCIAL HISTORY:  Social History   Socioeconomic History  . Marital status: Widowed    Spouse name: Not on file  . Number of children: Not on file  . Years of education: Not on file  . Highest education level: Not on file  Occupational History  . Not on file  Social Needs  . Financial resource strain: Not on file  . Food insecurity:    Worry: Not on file    Inability: Not on file  . Transportation needs:    Medical: Not on file    Non-medical: Not on file  Tobacco Use  . Smoking status: Never Smoker  . Smokeless tobacco: Never Used  Substance and Sexual Activity  . Alcohol use: No  . Drug use: No  . Sexual activity: Never  Lifestyle  . Physical activity:    Days per week: Not on file    Minutes per session: Not on file  . Stress: Not on file  Relationships    . Social connections:    Talks on phone: Not on file    Gets together: Not on file    Attends religious service: Not on file    Active member of club or organization: Not on file    Attends meetings of clubs or organizations: Not on file    Relationship status: Not on file  . Intimate partner violence:    Fear of current or ex partner: Not on file    Emotionally abused: Not on file    Physically abused: Not on file    Forced sexual activity: Not on file  Other Topics Concern  . 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 There were no vitals filed for this visit. General: Well-nourished, well-appearing female in no acute distress.  She is unaccompanied/accompanied in clinic by her ***** today.   HEENT: Head is normocephalic.  Pupils equal and reactive to light. Conjunctivae clear without exudate.  Sclerae anicteric. Oral mucosa is pink, moist.  Oropharynx is pink without lesions or erythema.  Lymph: No cervical, supraclavicular, or infraclavicular lymphadenopathy noted on palpation.  Cardiovascular: Regular rate and rhythm.Marland Kitchen Respiratory: Clear to auscultation bilaterally. Chest expansion symmetric; breathing non-labored.  GI: Abdomen soft and round; non-tender, non-distended. Bowel sounds normoactive.  GU: Deferred.  Neuro: No focal deficits. Steady gait.  Psych: Mood and affect normal and appropriate for situation.  Extremities: No edema. MSK: No focal spinal  tenderness to palpation.  Full range of motion in bilateral upper extremities Skin: Warm and dry.  LABORATORY  DATA:  None for this visit.  DIAGNOSTIC IMAGING:  None for this visit.      ASSESSMENT AND PLAN:  Ms.. Ballester is a pleasant 84 y.o. female with Stage IA left breast invasive ductal carcinoma, ER+/PR+/HER2-, diagnosed in 05/2017, treated with mastectomy, and anti-estrogen therapy with Letrozole beginning in 07/2017.  She presents to the Survivorship Clinic for our initial meeting and routine follow-up post-completion of treatment for breast cancer.    1. Stage IA left breast cancer:  Ms. Hulme is continuing to recover from definitive treatment for breast cancer. She will follow-up with her medical oncologist, Dr. Lindi Adie with history and physical exam per surveillance protocol.  She will continue her anti-estrogen therapy with (drug). Thus far, she is tolerating the *** well, with minimal side effects. She was instructed to make Dr. Lindi Adie or myself aware if she begins to experience any worsening side effects of the medication and I could see her back in clinic to help manage those side effects, as needed. Though the incidence is low, there is an associated risk of endometrial cancer with anti-estrogen therapies like Tamoxifen.  Ms. Bakken was encouraged to contact Dr. Carrington Clamp or myself with any vaginal bleeding while taking Tamoxifen. Other side effects of Tamoxifen were again reviewed with her as well. Today, a comprehensive survivorship care plan and treatment summary was reviewed with the patient today detailing her breast cancer diagnosis, treatment course, potential late/long-term effects of treatment, appropriate follow-up care with recommendations for the future, and patient education resources.  A copy of this summary, along with a letter will be sent to the patient's primary care provider via mail/fax/In Basket message after today's visit.    . Bone health:  Given Ms. Bourbon's age/history of breast cancer and her current treatment regimen including anti-estrogen therapy with _______,  she is at risk for bone demineralization.  Her last DEXA scan was **/**/20**, which showed (results).***  In the meantime, she was encouraged to increase her consumption of foods rich in calcium, as well as increase her weight-bearing activities.  She was given education on specific activities to promote bone health.  #. Cancer screening:  Due to Ms. Blea's history and her age, she should receive screening for skin cancers, colon cancer, and gynecologic cancers.  The information and recommendations are listed on the patient's comprehensive care plan/treatment summary and were reviewed in detail with the patient.    #. Health maintenance and wellness promotion: Ms. Beane was encouraged to consume 5-7 servings of fruits and vegetables per day. We reviewed the "Nutrition Rainbow" handout, as well as the handout "Take Control of Your Health and Reduce Your Cancer Risk" from the [facility].  She was also encouraged to engage in moderate to vigorous exercise for 30 minutes per day most days of the week. We discussed the [employer] fitness program, which is designed for cancer survivors to help them become more physically fit after cancer treatments.  She was instructed to limit her alcohol consumption and continue to abstain from tobacco use/***was encouraged stop smoking.     #. Support services/counseling: It is not uncommon for this period of the patient's cancer care trajectory to be one of many emotions and stressors.  We discussed an opportunity for her to participate in the next session of [facility] ("Finding Your New Normal") support group series designed for patients after they have completed treatment.  Ms. Rode was encouraged to take advantage of our many other support services programs, support groups, and/or counseling in coping with her new life as a cancer survivor after completing anti-cancer treatment.  She was offered support today through active listening and expressive  supportive counseling.  She was given information regarding our available services and encouraged to contact me with any questions or for help enrolling in any of our support group/programs.    Dispo:   -Return to cancer center ***  -Mammogram due in *** -Follow up with surgery *** -She is welcome to return back to the Survivorship Clinic at any time; no additional follow-up needed at this time.  -Consider referral back to survivorship as a long-term survivor for continued surveillance  A total of (30) minutes of face-to-face time was spent with this patient with greater than 50% of that time in counseling and care-coordination.   Gardenia Phlegm, [place] [phone]   Note: PRIMARY CARE PROVIDER Marin Olp, [place] [phone]

## 2017-11-10 ENCOUNTER — Telehealth: Payer: Self-pay

## 2017-11-10 ENCOUNTER — Ambulatory Visit (INDEPENDENT_AMBULATORY_CARE_PROVIDER_SITE_OTHER): Payer: Medicare Other | Admitting: Family Medicine

## 2017-11-10 ENCOUNTER — Encounter: Payer: Self-pay | Admitting: Family Medicine

## 2017-11-10 VITALS — BP 124/76 | HR 62 | Temp 98.3°F | Ht 61.0 in | Wt 121.8 lb

## 2017-11-10 DIAGNOSIS — F1190 Opioid use, unspecified, uncomplicated: Secondary | ICD-10-CM | POA: Diagnosis not present

## 2017-11-10 DIAGNOSIS — Z23 Encounter for immunization: Secondary | ICD-10-CM | POA: Diagnosis not present

## 2017-11-10 DIAGNOSIS — M159 Polyosteoarthritis, unspecified: Secondary | ICD-10-CM | POA: Diagnosis not present

## 2017-11-10 DIAGNOSIS — I10 Essential (primary) hypertension: Secondary | ICD-10-CM | POA: Diagnosis not present

## 2017-11-10 DIAGNOSIS — I5032 Chronic diastolic (congestive) heart failure: Secondary | ICD-10-CM

## 2017-11-10 MED ORDER — HYDROCODONE-ACETAMINOPHEN 5-325 MG PO TABS: 1.0000 | ORAL_TABLET | Freq: Two times a day (BID) | ORAL | 0 refills | Status: DC

## 2017-11-10 NOTE — Assessment & Plan Note (Signed)
S: Known OA in low back. Worsened after fall 5 years ago. She is on norco 5 mg twice a day. She is able to sleep with this regimen- this is separated from temazepam by at least 6 hours.   Has seen Dr. Durward Fortes years ago. Shots helped first time but not second time and made blood pressure go up so hasnt wanted to retry  She has not recently tried PT, ice, or heating pad. Aspercreme provides temporary relief.  A/P: Patient continues to deal with back pain. We discussed possibly going from 9 AM and 3 PM oxycodone 5 mg to 8 am, noon, 4 PM with then using her temazepam at 10 PM. She would prefer to keep doses lower at present unless pain worsens.  - we discussed trial of ice or heat. Can continue aspercreme -tried to order physical therapy on her sheet form [facility] for low back pain- can enter additional orders if needed -NCCSRS reviewed. Overdose risk score 180- only getting rx through me at present for hydrocodone and temazepam.

## 2017-11-10 NOTE — Patient Instructions (Addendum)
Health Maintenance Due  Topic Date Due  . PNA vac Low Risk Adult (2 of 2 - PCV13) - Today at office visit prevnar 13 03/24/2012  . INFLUENZA VACCINE - Postponed until October 10/20/2017   . Team- please get info from her pharmacy on shingrix shot- she has upcoming one so could also wait until next visit    . I would also like for you to sign up for an annual wellness visit with one of our nurses, Cassie or Manuela Schwartz, who both specialize in the annual wellness visit. This is a free benefit under medicare that may help [name] find additional ways to help you. Some highlights are reviewing medications, lifestyle, and doing a dementia screen.    We will continue hydrocodone twice a day at 9 AM and 3 PM with temazepam at 9 30. We discussed possibly doing 8 AM, noon, 4 pm for hydrocodone and then 10 pm for temazepam if your pain level worsens.   I want you to ice or heat on your low back 3x a day for 20 minutes. I think heat will probably be more helpful. Lets try PT for low back pain as well- written on your orders.   For dermatology- [facility] dermatology does a great job [address], [place], [place] [address], [place]  Phone: [phone]  Lets consider updating bloodwork next visit.

## 2017-11-10 NOTE — Assessment & Plan Note (Signed)
S: controlled on  lasix 30mg , losartan 100mg , amlodipine 5mg  (up from 2.5 mg per Dr. Stanford Breed)  BP Readings from Last 3 Encounters:  11/10/17 124/76  09/20/17 (!) 179/67  08/11/17 134/70  A/P: We discussed blood pressure goal of <140/90. Continue current meds

## 2017-11-10 NOTE — Assessment & Plan Note (Signed)
No signs of fluid overload. Continue lasix 30 mg daily.

## 2017-11-10 NOTE — Progress Notes (Signed)
Subjective:  Glenda [name] is a 84 y.o. year old very pleasant female patient who presents for/with See problem oriented charting ROS- chest feels irregular/bumpy since surgery. No fever or chills. No chest pain reported. no increased edema.    Past Medical History-  Patient Active Problem List   Diagnosis Date Noted  . Chronic narcotic use 08/11/2017    Priority: High  . Malignant neoplasm of upper-outer quadrant of left breast in female, estrogen receptor positive ([place]) 06/27/2017    Priority: High  . Atrial flutter ([place])     Priority: High  . Chronic diastolic HF (heart failure) ([place]) 07/11/2012    Priority: High  . Osteoarthritis 04/03/2012    Priority: High  . Shortness of breath 12/13/2014    Priority: Medium  . Aortic stenosis, mild 07/11/2012    Priority: Medium  . Essential hypertension 03/25/2011    Priority: Medium  . Insomnia 09/15/2010    Priority: Medium  . Hypercholesterolemia 09/15/2010    Priority: Medium  . History of cardioversion 08/20/2016    Priority: Low  . Palpitations 12/13/2014    Priority: Low  . Constipation 11/06/2012    Priority: Low  . Accelerated/malignant hypertension 09/14/2012    Priority: Low  . Anemia 06/10/2011    Priority: Low  . Stage I breast cancer, left ([place]) 07/22/2017    Medications- reviewed and updated Current Outpatient Medications  Medication Sig Dispense Refill  . amLODipine (NORVASC) 5 MG tablet Take 1 tablet (5 mg total) by mouth daily. 90 tablet 3  . apixaban (ELIQUIS) 2.5 MG TABS tablet Take 1 tablet (2.5 mg total) by mouth 2 (two) times daily. 60 tablet 4  . benzonatate (TESSALON) 100 MG capsule Take one (1) to two (2) capsules by mouth every eight (8) hours as needed for cough.    . Cholecalciferol 1000 units tablet Take 1,000 Units by mouth daily.    . Cranberry 425 MG CAPS Take 425 mg by mouth 2 (two) times daily.    Marland Kitchen docusate sodium (COLACE) 100 MG capsule Take 100 mg by mouth 2 (two) times daily.    .  furosemide (LASIX) 20 MG tablet Take 1.5 tablets (30 mg total) by mouth daily. 45 tablet 3  . HYDROcodone-acetaminophen (NORCO/VICODIN) 5-325 MG tablet Take 1 tablet by mouth 2 (two) times daily. At 9 Am and 3 PM. Separate by 6 hours from temazepam. Fill today. 60 tablet 0  . HYDROcodone-acetaminophen (NORCO/VICODIN) 5-325 MG tablet Take 1 tablet by mouth 2 (two) times daily. At 9 Am and 3 PM. Separate by 6 hours from temazepam. Fill in 1 month 60 tablet 0  . letrozole (FEMARA) 2.5 MG tablet Take 1 tablet (2.5 mg total) by mouth daily. 90 tablet 3  . losartan (COZAAR) 100 MG tablet Take 1 tablet (100 mg total) by mouth daily. 90 tablet 3  . Multiple Vitamins-Minerals (CEROVITE ADVANCED FORMULA PO) Take 1 tablet by mouth daily. (0800)    . omeprazole (PRILOSEC) 40 MG capsule Take 40 mg by mouth daily.    . ondansetron (ZOFRAN) 4 MG tablet Take 4 mg by mouth every 6 (six) hours as needed for nausea.    . polyethylene glycol (MIRALAX / GLYCOLAX) packet Take 17 g by mouth daily. Taken  Daily Monday to Saturday.    . temazepam (RESTORIL) 15 MG capsule Take 1 capsule (15 mg total) by mouth at bedtime. Should separate from hydrocodone by at least 6 hours. 30 capsule 2  . HYDROcodone-acetaminophen (NORCO/VICODIN) 5-325 MG tablet  Take 1 tablet by mouth 2 (two) times daily. At 9 Am and 3 PM. Separate by 6 hours from temazepam. Fill in 2 months 60 tablet 0   No current facility-administered medications for this visit.     Objective: BP 124/76 (BP Location: Left Arm, Patient Position: Sitting, Cuff Size: Normal)   Pulse 62   Temp 98.3 F (36.8 C) (Oral)   Ht 5\' 1"  (1.549 m)   Wt 121 lb 12.8 oz (55.2 kg)   SpO2 98%   BMI 23.01 kg/m  Gen: NAD, resting comfortably, very pleasant CV: RRR no murmurs rubs or gallops Lungs: CTAB no crackles, wheeze, rhonchi Abdomen: soft/nontender/nondistended/normal bowel sounds.  Ext: trace edema Skin: warm, dry Neuro: walks with rolling  walker  Assessment/Plan:  Other notes: 1.has recovered well from breast surgery on left breast. Following with oncology and surgery    Osteoarthritis S: Known OA in low back. Worsened after fall 5 years ago. She is on norco 5 mg twice a day. She is able to sleep with this regimen- this is separated from temazepam by at least 6 hours.   Has seen Dr. Durward Fortes years ago. Shots helped first time but not second time and made blood pressure go up so hasnt wanted to retry  She has not recently tried PT, ice, or heating pad. Aspercreme provides temporary relief.  A/P: Patient continues to deal with back pain. We discussed possibly going from 9 AM and 3 PM oxycodone 5 mg to 8 am, noon, 4 PM with then using her temazepam at 10 PM. She would prefer to keep doses lower at present unless pain worsens.  - we discussed trial of ice or heat. Can continue aspercreme -tried to order physical therapy on her sheet form [facility] for low back pain- can enter additional orders if needed -NCCSRS reviewed. Overdose risk score 180- only getting rx through me at present for hydrocodone and temazepam.   Essential hypertension S: controlled on  lasix 30mg , losartan 100mg , amlodipine 5mg  (up from 2.5 mg per Dr. Stanford Breed)  BP Readings from Last 3 Encounters:  11/10/17 124/76  09/20/17 (!) 179/67  08/11/17 134/70  A/P: We discussed blood pressure goal of <140/90. Continue current meds  Chronic diastolic HF (heart failure) ([facility]) No signs of fluid overload. Continue lasix 30 mg daily.    Return in about 3 months (around 02/10/2018).  Lab/Order associations: Need for prophylactic vaccination against Streptococcus pneumoniae (pneumococcus) - Plan: Pneumococcal conjugate vaccine 13-valent  Osteoarthritis of multiple joints, unspecified osteoarthritis type  Chronic narcotic use  Essential hypertension  Chronic diastolic HF (heart failure) ([place])  Meds ordered this encounter  Medications  .  HYDROcodone-acetaminophen (NORCO/VICODIN) 5-325 MG tablet    Sig: Take 1 tablet by mouth 2 (two) times daily. At 9 Am and 3 PM. Separate by 6 hours from temazepam. Fill today.    Dispense:  60 tablet    Refill:  0  . HYDROcodone-acetaminophen (NORCO/VICODIN) 5-325 MG tablet    Sig: Take 1 tablet by mouth 2 (two) times daily. At 9 Am and 3 PM. Separate by 6 hours from temazepam. Fill in 1 month    Dispense:  60 tablet    Refill:  0  . HYDROcodone-acetaminophen (NORCO/VICODIN) 5-325 MG tablet    Sig: Take 1 tablet by mouth 2 (two) times daily. At 9 Am and 3 PM. Separate by 6 hours from temazepam. Fill in 2 months    Dispense:  60 tablet    Refill:  0  Return precautions advised.  Garret Reddish, MD

## 2017-11-10 NOTE — Telephone Encounter (Signed)
Will await for fax from [facility].  Copied from [place] [id number]. Topic: Quick Communication - See Telephone Encounter >> Nov 10, 2017  2:23 PM Vernona Rieger wrote: CRM for notification. See Telephone encounter for: 11/10/17.   Jenny Reichmann from [facility] will be faxing an order over to Dr Yong Channel and she just wanted the office to be aware because they will need that back as soon as possible. Thanks

## 2017-11-23 ENCOUNTER — Telehealth: Payer: Self-pay

## 2017-11-23 NOTE — Telephone Encounter (Signed)
Per 9/4 vm return calls. Schedule patient a new appointment with [facility] next avalable  Date. LEFT A VOICE MAIL with details and aalso mailed a letter with a calender enclosed

## 2017-11-24 NOTE — Telephone Encounter (Signed)
Returned patient call. Per 9/5 with the same message of her appointment r/s date

## 2017-11-28 ENCOUNTER — Ambulatory Visit: Payer: Medicare Other | Admitting: Hematology and Oncology

## 2017-12-08 ENCOUNTER — Telehealth: Payer: Self-pay | Admitting: Family Medicine

## 2017-12-08 NOTE — Telephone Encounter (Signed)
See note

## 2017-12-08 NOTE — Telephone Encounter (Signed)
Copied from [place] [id number]. Topic: Quick Communication - Rx Refill/Question >> Dec 08, 2017 11:46 AM Margot Ables wrote: Medication: hydrocodone - the RX is written to be filled Sunday 9/22 but the pharmacy is closed weekends and the pt runs out of medication on Sunday. Requesting approval to fill RX today 9/19 or tomorrow 9/20. She cannot change the date due to the controlled medication. She would need a new RX to fill with the corrected date.   [place], [place] - [place] [phone] (Phone) [phone] (Fax)

## 2017-12-08 NOTE — Telephone Encounter (Signed)
How many pills does she have left?   If 5 pills- 1 pill BID Friday, 1 pill BID Saturday, Half pill BID Sunday.  If 4 pills- 1 pill BID Friday, full pill AM Saturday and half pill PM Saturday, full pill AM Sunday and half pill PM sunday  If we end up changing the date everytime due to landing on a weekend- we will frequently be refilling controlled substances and having multiple scripts out- this is less than ideal with controlled substances. We need to find solutions with the pills she has available.

## 2017-12-09 NOTE — Telephone Encounter (Signed)
Glenda [name] with [facility] calling and states pt will be out of the hydrocodone tomorrow. She also states there pharmacy closes at 5 today and not re-open until Monday. She wanted the dr to be aware.

## 2017-12-09 NOTE — Telephone Encounter (Signed)
Glenda [name] with [facility] calling to see if this medication will be filled early for this pt? Please advise even if pt cannot get the med refilled early.

## 2017-12-09 NOTE — Telephone Encounter (Signed)
See note

## 2017-12-12 NOTE — Telephone Encounter (Signed)
Did they get my note about spacing out hydrocodone to get her through weekend? Did she get her refill today?

## 2017-12-13 NOTE — Telephone Encounter (Signed)
Returned Darlenes call. She states that the patient has high dependency on the Norco and tries to take it earlier than allowed. She states that this has been ongoing for years. I reviewed Dr Ronney Lion administration instructions. She will pass this on to [facility]. I asked that [facility] call us back if she has any questions or concerns. No problems reported with prescription or refills.

## 2017-12-13 NOTE — Telephone Encounter (Signed)
Attempted to call staff at [facility]. No staff available to talk. States they will call back shortly.

## 2017-12-19 ENCOUNTER — Ambulatory Visit (INDEPENDENT_AMBULATORY_CARE_PROVIDER_SITE_OTHER): Payer: Medicare Other | Admitting: Physician Assistant

## 2017-12-19 ENCOUNTER — Encounter: Payer: Self-pay | Admitting: Physician Assistant

## 2017-12-19 VITALS — BP 126/70 | HR 54 | Temp 98.1°F | Ht 61.0 in | Wt 122.0 lb

## 2017-12-19 DIAGNOSIS — R350 Frequency of micturition: Secondary | ICD-10-CM | POA: Diagnosis not present

## 2017-12-19 LAB — POCT URINALYSIS DIPSTICK
Bilirubin, UA: NEGATIVE
Glucose, UA: NEGATIVE
Ketones, UA: NEGATIVE
Nitrite, UA: NEGATIVE
Protein, UA: NEGATIVE
Spec Grav, UA: 1.02 (ref 1.010–1.025)
Urobilinogen, UA: 0.2 E.U./dL
pH, UA: 6 (ref 5.0–8.0)

## 2017-12-19 MED ORDER — CEPHALEXIN 500 MG PO CAPS: 500.0000 mg | ORAL_CAPSULE | Freq: Two times a day (BID) | ORAL | 0 refills | Status: DC

## 2017-12-19 NOTE — Progress Notes (Signed)
Glenda [name] is a 83 y.o. female here for a new problem.  I acted as a Education administrator for [employer], PA-C Anselmo Pickler, LPN  History of Present Illness:   Chief Complaint  Patient presents with  . Urinary Frequency    Urinary Frequency   This is a new problem. Episode onset: Started Friday. The problem has been gradually worsening. Pain severity now: Pelvic pressure. There has been no fever. She is not sexually active. There is no history of pyelonephritis. Associated symptoms include frequency, hesitancy and urgency. Pertinent negatives include no chills, flank pain, hematuria, nausea or vomiting. She has tried nothing for the symptoms. Her past medical history is significant for recurrent UTIs.   No hx of kidney stones.  Past Medical History:  Diagnosis Date  . Aortic stenosis    Echo 02/2011 showing mild AS with normal LV systolic function  . Arthritis    "lower back" (10/09/2015)  . Atrial flutter with rapid ventricular response ([place]) 08/19/2016   s/p successful DCCV on 08/20/16, continue eliquis  . Chronic bronchitis ([place])    "off and on; several years" (10/09/2015)  . Chronic diastolic CHF (congestive heart failure) ([place])    a. 12/2015 Echo: EF 55-60%, mild AI/MR, mildly dil LA.  Marland Kitchen Chronic lower back pain   . Complication of anesthesia    "they had trouble waking me up after colon resection"  . DDD (degenerative disc disease), lumbar    severe facet dz and adv DDD MRI L spine 2009  . Diverticulosis   . GERD (gastroesophageal reflux disease)   . Hiatal hernia    hx  . Hypercholesterolemia   . Hypertension   . Insomnia   . OA (osteoarthritis) of knee   . Paroxysmal atrial flutter ([place])    a. 09/2015 s/p TEE/DCCV;  b. CHA2DS2VASc = 5-->Eliquis.  . Peripheral edema   . PVCs (premature ventricular contractions)   . Sacral fracture ([place]) 11/06/2012  . TIA (transient ischemic attack)    pt denies this hx on 10/09/2015 "it was the [place] I was taking; they had thought I was  having a stroke"  . Vertigo 11/19/2012     Social History   Socioeconomic History  . Marital status: Widowed    Spouse name: Not on file  . Number of children: Not on file  . Years of education: Not on file  . Highest education level: Not on file  Occupational History  . Not on file  Social Needs  . Financial resource strain: Not on file  . Food insecurity:    Worry: Not on file    Inability: Not on file  . Transportation needs:    Medical: Not on file    Non-medical: Not on file  Tobacco Use  . Smoking status: Never Smoker  . Smokeless tobacco: Never Used  Substance and Sexual Activity  . Alcohol use: No  . Drug use: No  . Sexual activity: Never  Lifestyle  . Physical activity:    Days per week: Not on file    Minutes per session: Not on file  . Stress: Not on file  Relationships  . Social connections:    Talks on phone: Not on file    Gets together: Not on file    Attends religious service: Not on file    Active member of club or organization: Not on file    Attends meetings of clubs or organizations: Not on file    Relationship status: Not on file  .  Intimate partner violence:    Fear of current or ex partner: Not on file    Emotionally abused: Not on file    Physically abused: Not on file    Forced sexual activity: Not on file  Other Topics Concern  . 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 BREAST BIOPSY Left   . CARDIAC CATHETERIZATION  02/17/2001   MILD REGURGITATION. EF 60%  . CARDIOVERSION N/A 10/08/2015   Procedure: CARDIOVERSION;  Surgeon: Sanda Klein, MD;   Location: [facility];  Service: Cardiovascular;  Laterality: N/A;  . CARDIOVERSION N/A 08/20/2016   Procedure: Cardioversion;  Surgeon: Evans Lance, MD;  Location: [facility] CV LAB;  Service: Cardiovascular;  Laterality: N/A;  . CATARACT EXTRACTION W/ INTRAOCULAR LENS  IMPLANT, BILATERAL Bilateral   . CESAREAN SECTION  1954  . COLECTOMY     related to "blockage"  . COLONOSCOPY    . EXCISIONAL HEMORRHOIDECTOMY    . INGUINAL HERNIA REPAIR Bilateral   . KYPHOPLASTY Right 09/11/2012   Procedure: Right Acrylic Sacroplasty;  Surgeon: Kristeen Miss, MD;  Location: [facility] NEURO ORS;  Service: Neurosurgery;  Laterality: Right;  Right  Acrylic Sacroplasty  . TEE WITHOUT CARDIOVERSION N/A 10/08/2015   Procedure: TRANSESOPHAGEAL ECHOCARDIOGRAM (TEE);  Surgeon: Sanda Klein, MD;  Location: [facility] ENDOSCOPY;  Service: Cardiovascular;  Laterality: N/A;  . TOTAL KNEE ARTHROPLASTY Right   . TOTAL MASTECTOMY Left 07/22/2017   Procedure: TOTAL MASTECTOMY;  Surgeon: Excell Seltzer, MD;  Location: [facility];  Service: General;  Laterality: Left;  . UMBILICAL HERNIA REPAIR      Family History  Problem Relation Age of Onset  . Heart disease Mother   . Hypertension Father   . Other Sister        66 in 2019  . Other Sister        30 in 2019  . Heart disease Sister   . Heart attack Neg Hx   . Stroke Neg Hx     Allergies  Allergen Reactions  . Ace Inhibitors Other (See Comments)    Cough  . Halcion [Triazolam]     UNSPECIFIED REACTION   . Pentazocine Lactate     UNSPECIFIED REACTION   . Sulfa Drugs Cross Reactors     UNSPECIFIED REACTION   . Amitriptyline Other (See Comments)    Hyperactivity  . Avelox [Moxifloxacin Hcl In Nacl] Other (See Comments)    dizziness  . Penicillins Rash    Current Medications:   Current Outpatient Medications:  .  amLODipine (NORVASC) 5 MG tablet, Take 1 tablet (5 mg total) by mouth daily., Disp: 90 tablet, Rfl: 3 .  apixaban (ELIQUIS) 2.5 MG TABS tablet, Take 1  tablet (2.5 mg total) by mouth 2 (two) times daily., Disp: 60 tablet, Rfl: 4 .  benzonatate (TESSALON) 100 MG capsule, Take one (1) to two (2) capsules by mouth every eight (8) hours as needed for cough., Disp: , Rfl:  .  Cholecalciferol 1000 units tablet, Take 1,000 Units by mouth daily., Disp: ,  Rfl:  .  Cranberry 425 MG CAPS, Take 425 mg by mouth 2 (two) times daily., Disp: , Rfl:  .  docusate sodium (COLACE) 100 MG capsule, Take 100 mg by mouth 2 (two) times daily., Disp: , Rfl:  .  furosemide (LASIX) 20 MG tablet, Take 1.5 tablets (30 mg total) by mouth daily., Disp: 45 tablet, Rfl: 3 .  HYDROcodone-acetaminophen (NORCO/VICODIN) 5-325 MG tablet, Take 1 tablet by mouth 2 (two) times daily. At 9 Am and 3 PM. Separate by 6 hours from temazepam. Fill today., Disp: 60 tablet, Rfl: 0 .  HYDROcodone-acetaminophen (NORCO/VICODIN) 5-325 MG tablet, Take 1 tablet by mouth 2 (two) times daily. At 9 Am and 3 PM. Separate by 6 hours from temazepam. Fill in 1 month, Disp: 60 tablet, Rfl: 0 .  HYDROcodone-acetaminophen (NORCO/VICODIN) 5-325 MG tablet, Take 1 tablet by mouth 2 (two) times daily. At 9 Am and 3 PM. Separate by 6 hours from temazepam. Fill in 2 months, Disp: 60 tablet, Rfl: 0 .  letrozole (FEMARA) 2.5 MG tablet, Take 1 tablet (2.5 mg total) by mouth daily., Disp: 90 tablet, Rfl: 3 .  losartan (COZAAR) 100 MG tablet, Take 1 tablet (100 mg total) by mouth daily., Disp: 90 tablet, Rfl: 3 .  Multiple Vitamins-Minerals (CEROVITE ADVANCED FORMULA PO), Take 1 tablet by mouth daily. (0800), Disp: , Rfl:  .  omeprazole (PRILOSEC) 40 MG capsule, Take 40 mg by mouth daily., Disp: , Rfl:  .  ondansetron (ZOFRAN) 4 MG tablet, Take 4 mg by mouth every 6 (six) hours as needed for nausea., Disp: , Rfl:  .  polyethylene glycol (MIRALAX / GLYCOLAX) packet, Take 17 g by mouth daily. Taken  Daily Monday to Saturday., Disp: , Rfl:  .  temazepam (RESTORIL) 15 MG capsule, Take 1 capsule (15 mg total) by mouth at bedtime.  Should separate from hydrocodone by at least 6 hours., Disp: 30 capsule, Rfl: 2 .  cephALEXin (KEFLEX) 500 MG capsule, Take 1 capsule (500 mg total) by mouth 2 (two) times daily for 7 days., Disp: 14 capsule, Rfl: 0   Review of Systems:   Review of Systems  Constitutional: Negative for chills.  Gastrointestinal: Negative for nausea and vomiting.  Genitourinary: Positive for frequency, hesitancy and urgency. Negative for flank pain and hematuria.    Vitals:   Vitals:   12/19/17 1349  BP: 126/70  Pulse: (!) 54  Temp: 98.1 F (36.7 C)  TempSrc: Oral  SpO2: 97%  Weight: 122 lb (55.3 kg)  Height: 5\' 1"  (1.549 m)     Body mass index is 23.05 kg/m.  Physical Exam:   Physical Exam  Constitutional: She appears well-developed. She is cooperative.  Non-toxic appearance. She does not have a sickly appearance. She does not appear ill. No distress.  Cardiovascular: Normal rate, regular rhythm, S1 normal, S2 normal, normal heart sounds and normal pulses.  No LE edema  Pulmonary/Chest: Effort normal and breath sounds normal.  Abdominal: Normal appearance and bowel sounds are normal. There is no tenderness. There is no rigidity, no rebound, no guarding and no CVA tenderness.  Neurological: She is alert. GCS eye subscore is 4. GCS verbal subscore is 5. GCS motor subscore is 6.  Skin: Skin is warm, dry and intact.  Psychiatric: She has a normal mood and affect. Her speech is normal and behavior is normal.  Nursing note and vitals reviewed.   Results for orders placed or performed in visit on 12/19/17  POCT urinalysis dipstick  Result Value  Ref Range   Color, UA Yellow    Clarity, UA Cloudy    Glucose, UA Negative Negative   Bilirubin, UA Negative    Ketones, UA Negative    Spec Grav, UA 1.020 1.010 - 1.025   Blood, UA Trace    pH, UA 6.0 5.0 - 8.0   Protein, UA Negative Negative   Urobilinogen, UA 0.2 0.2 or 1.0 E.U./dL   Nitrite, UA Negative    Leukocytes, UA Large (3+) (A)  Negative   Appearance     Odor      Assessment and Plan:    Jailen was seen today for urinary frequency.  Diagnoses and all orders for this visit:  Frequency of urination -     POCT urinalysis dipstick -     Urine Culture  Other orders -     cephALEXin (KEFLEX) 500 MG capsule; Take 1 capsule (500 mg total) by mouth 2 (two) times daily for 7 days.   Suspect acute cystitis. Urine culture pending. She has amoxicillin allergy but tells me she can tolerate Keflex. Will start Keflex at this time. I recommended pushing fluids. Follow-up if symptoms worsen or persist despite treatment. Culture pending -- will update treatment if necessary.  . Reviewed expectations re: course of current medical issues. . Discussed self-management of symptoms. . Outlined signs and symptoms indicating need for more acute intervention. . Patient verbalized understanding and all questions were answered. . See orders for this visit as documented in the electronic medical record. . Patient received an After-Visit Summary.  CMA or LPN served as scribe during this visit. History, Physical, and Plan performed by medical provider. The above documentation has been reviewed and is accurate and complete.  Inda Coke, PA-C

## 2017-12-19 NOTE — Patient Instructions (Signed)
Start the oral antibiotic. Follow-up if symptoms worsen or persist despite treatment.   Urinary Tract Infection, Adult A urinary tract infection (UTI) is an infection of any part of the urinary tract. The urinary tract includes the:  Kidneys.  Ureters.  Bladder.  Urethra.  These organs make, store, and get rid of pee (urine) in the body. Follow these instructions at home:  Take over-the-counter and prescription medicines only as told by your doctor.  If you were prescribed an antibiotic medicine, take it as told by your doctor. Do not stop taking the antibiotic even if you start to feel better.  Avoid the following drinks: ? Alcohol. ? Caffeine. ? Tea. ? Carbonated drinks.  Drink enough fluid to keep your pee clear or pale yellow.  Keep all follow-up visits as told by your doctor. This is important.  Make sure to: ? Empty your bladder often and completely. Do not to hold pee for long periods of time. ? Empty your bladder before and after sex. ? Wipe from front to back after a bowel movement if you are female. Use each tissue one time when you wipe. Contact a doctor if:  You have back pain.  You have a fever.  You feel sick to your stomach (nauseous).  You throw up (vomit).  Your symptoms do not get better after 3 days.  Your symptoms go away and then come back. Get help right away if:  You have very bad back pain.  You have very bad lower belly (abdominal) pain.  You are throwing up and cannot keep down any medicines or water. This information is not intended to replace advice given to you by your health care provider. Make sure you discuss any questions you have with your health care provider. Document Released: 08/25/2007 Document Revised: 08/14/2015 Document Reviewed: 01/27/2015 Elsevier Interactive Patient Education  [employer].

## 2017-12-22 LAB — URINE CULTURE
MICRO NUMBER:: 91171264
SPECIMEN QUALITY:: ADEQUATE

## 2017-12-23 ENCOUNTER — Other Ambulatory Visit: Payer: Self-pay | Admitting: Physician Assistant

## 2017-12-23 MED ORDER — CIPROFLOXACIN HCL 250 MG PO TABS: 250.0000 mg | ORAL_TABLET | Freq: Two times a day (BID) | ORAL | 0 refills | Status: AC

## 2017-12-27 IMAGING — DX DG CHEST 1V PORT
1 series · 1 of 1 positions shown · non-contrast
Comparison: CTA chest 10/06/2015. Chest x-rays 10/06/2015,
03/01/2012 and earlier.

CLINICAL DATA: 89-year-old with acute onset of shortness of breath.

EXAM:
PORTABLE CHEST 1 VIEW

[chest]
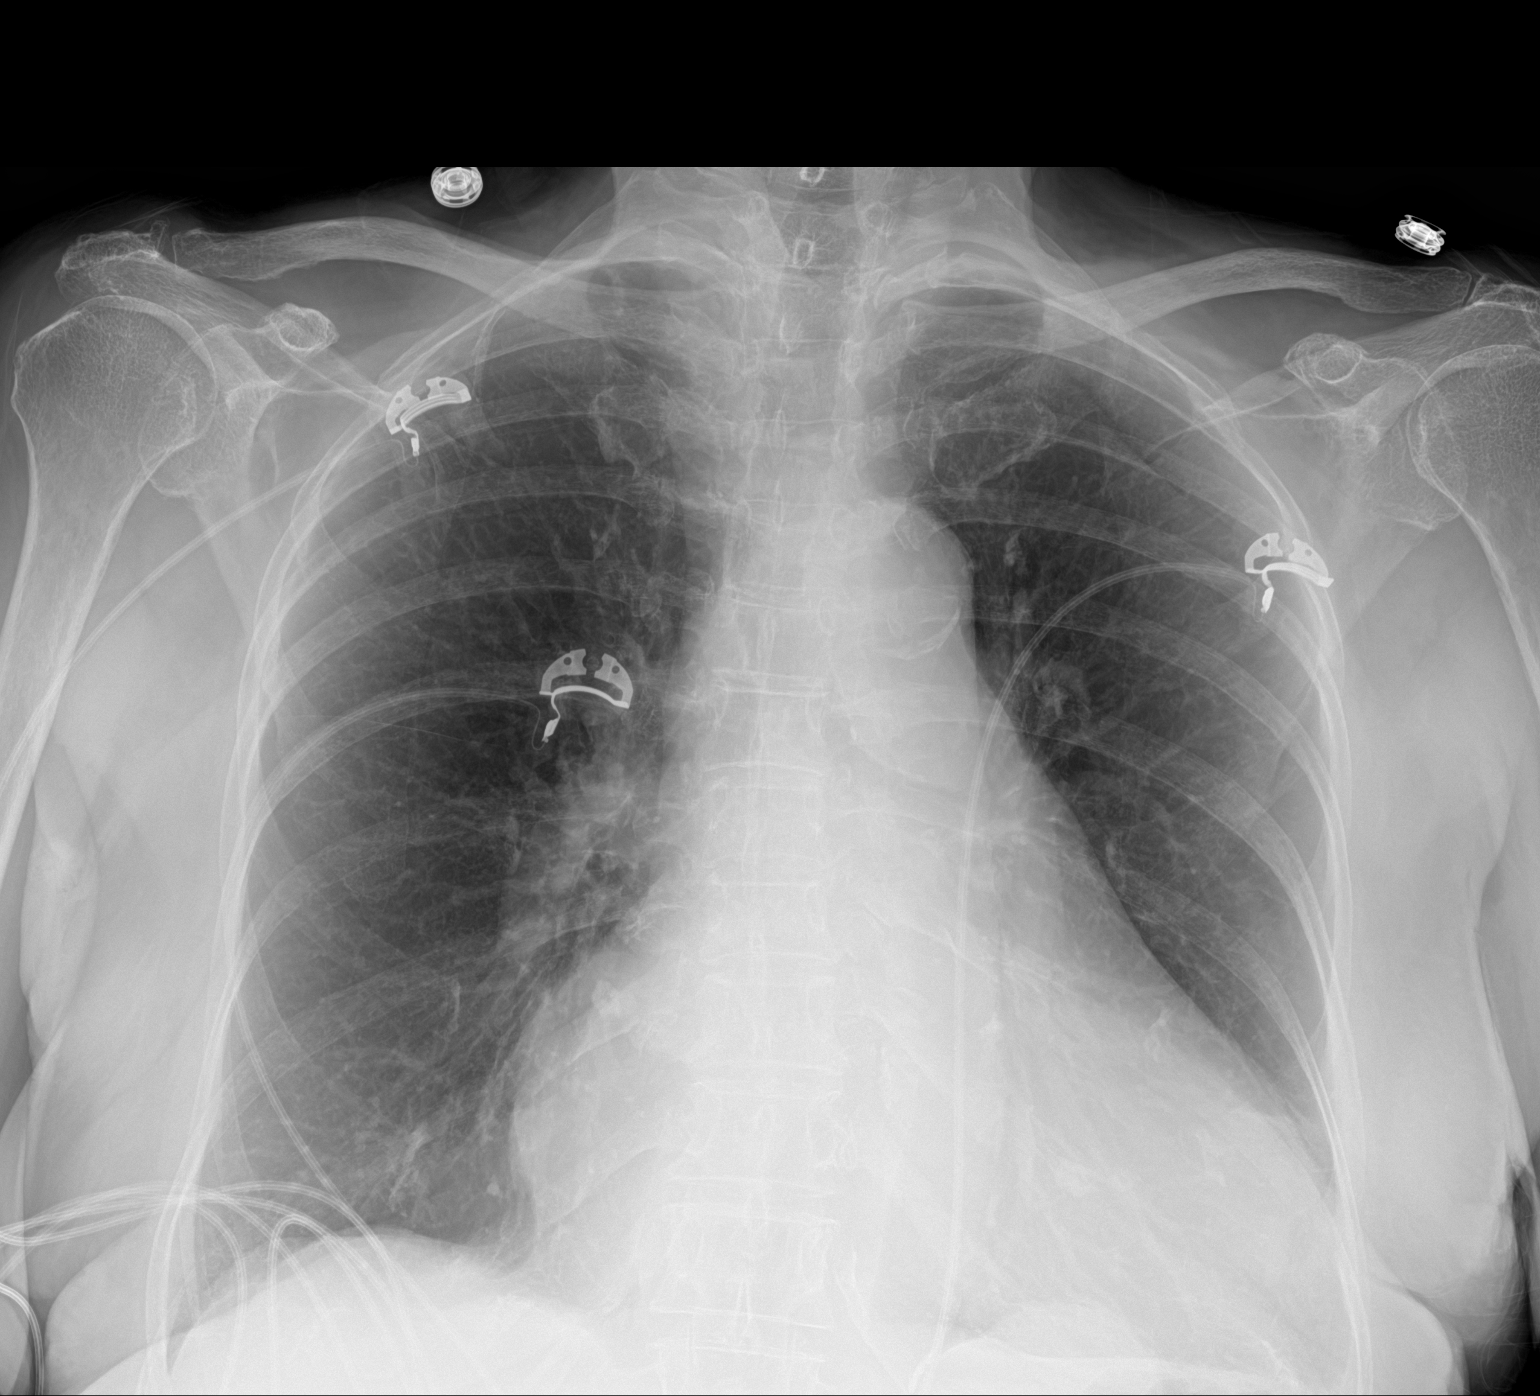

[1 of 1 positions shown; findings below may reference images not displayed]

FINDINGS: Cardiac silhouette markedly enlarged, unchanged. Thoracic aorta
tortuous and atherosclerotic, unchanged. Hilar and mediastinal
contours otherwise unremarkable. Lungs clear. Bronchovascular
markings normal. Pulmonary vascularity normal. No visible pleural
effusions. No pneumothorax.
IMPRESSION: Stable marked cardiomegaly.  No acute cardiopulmonary disease.

## 2018-01-16 ENCOUNTER — Other Ambulatory Visit: Payer: Self-pay | Admitting: Physician Assistant

## 2018-01-16 ENCOUNTER — Other Ambulatory Visit (INDEPENDENT_AMBULATORY_CARE_PROVIDER_SITE_OTHER): Payer: Medicare Other

## 2018-01-16 DIAGNOSIS — R350 Frequency of micturition: Secondary | ICD-10-CM

## 2018-01-16 LAB — URINALYSIS, ROUTINE W REFLEX MICROSCOPIC
Bilirubin Urine: NEGATIVE
Hgb urine dipstick: NEGATIVE
Ketones, ur: NEGATIVE
Leukocytes, UA: NEGATIVE
Nitrite: NEGATIVE
RBC / HPF: NONE SEEN (ref 0–?)
Specific Gravity, Urine: 1.01 (ref 1.000–1.030)
Total Protein, Urine: NEGATIVE
Urine Glucose: NEGATIVE
Urobilinogen, UA: 0.2 (ref 0.0–1.0)
pH: 6 (ref 5.0–8.0)

## 2018-01-17 LAB — URINE CULTURE
MICRO NUMBER:: 91293651
SPECIMEN QUALITY:: ADEQUATE

## 2018-01-18 ENCOUNTER — Telehealth: Payer: Self-pay | Admitting: Family Medicine

## 2018-01-18 NOTE — Telephone Encounter (Signed)
See note

## 2018-01-18 NOTE — Telephone Encounter (Signed)
Noted  

## 2018-01-18 NOTE — Telephone Encounter (Signed)
Pt returned call and lab message given to her with verbal understanding. Pt denies any symptoms today. States it is all cleared up.

## 2018-01-26 ENCOUNTER — Telehealth: Payer: Self-pay | Admitting: Hematology and Oncology

## 2018-01-26 NOTE — Telephone Encounter (Signed)
Pt called to r/s appts. Pt asked to see dr regarding concerns

## 2018-01-26 NOTE — Telephone Encounter (Signed)
lvm for pt regarding vm she left to r/s appts.

## 2018-01-31 ENCOUNTER — Encounter: Payer: Medicare Other | Admitting: Adult Health

## 2018-02-07 ENCOUNTER — Other Ambulatory Visit: Payer: Self-pay | Admitting: Family Medicine

## 2018-02-07 NOTE — Telephone Encounter (Signed)
Needs office visit per controlled substance policy- I can fill up to 3 months only of medication

## 2018-02-08 NOTE — Telephone Encounter (Signed)
Called and spoke to patient and let her know that she has to have an appointment to get this medication refilled. She is going to arrange her transportation and then call back for an appointment

## 2018-02-09 ENCOUNTER — Ambulatory Visit (INDEPENDENT_AMBULATORY_CARE_PROVIDER_SITE_OTHER): Payer: Medicare Other | Admitting: Family Medicine

## 2018-02-09 ENCOUNTER — Encounter: Payer: Self-pay | Admitting: Family Medicine

## 2018-02-09 VITALS — BP 146/76 | HR 59 | Temp 98.2°F | Ht 61.0 in | Wt 122.4 lb

## 2018-02-09 DIAGNOSIS — G894 Chronic pain syndrome: Secondary | ICD-10-CM

## 2018-02-09 DIAGNOSIS — I5032 Chronic diastolic (congestive) heart failure: Secondary | ICD-10-CM

## 2018-02-09 DIAGNOSIS — I10 Essential (primary) hypertension: Secondary | ICD-10-CM | POA: Diagnosis not present

## 2018-02-09 DIAGNOSIS — M159 Polyosteoarthritis, unspecified: Secondary | ICD-10-CM | POA: Diagnosis not present

## 2018-02-09 MED ORDER — HYDROCODONE-ACETAMINOPHEN 5-325 MG PO TABS: 1.0000 | ORAL_TABLET | Freq: Two times a day (BID) | ORAL | 0 refills | Status: DC

## 2018-02-09 NOTE — Assessment & Plan Note (Signed)
S: Appears euvolemic on Lasix 30 mg. A/P: Stable-continue current medications.  Consider updating labs including bmet or CMET at next visit.

## 2018-02-09 NOTE — Patient Instructions (Addendum)
Health Maintenance Due  Topic Date Due  . INFLUENZA VACCINE - please have [facility] let us know the date of your flu shot so we can put it in your record 10/20/2017   [place] to see you today- hope you have a happy thanksgiving- can go ahead and schedule your 3 month follow up before you leave so we dont run out of the pain medicine

## 2018-02-09 NOTE — Progress Notes (Signed)
Subjective:  Glenda [name] is a 84 y.o. year old very pleasant female patient who presents for/with See problem oriented charting ROS- No exertional chest pain. no shortness of breath. No headache or blurry vision. Does get low back pain     Past Medical History-  Patient Active Problem List   Diagnosis Date Noted  . Chronic narcotic use 08/11/2017    Priority: High  . Malignant neoplasm of upper-outer quadrant of left breast in female, estrogen receptor positive ([place]) 06/27/2017    Priority: High  . Atrial flutter ([place])     Priority: High  . Chronic diastolic HF (heart failure) ([employer]) 07/11/2012    Priority: High  . Osteoarthritis 04/03/2012    Priority: High  . Shortness of breath 12/13/2014    Priority: Medium  . Aortic stenosis, mild 07/11/2012    Priority: Medium  . Essential hypertension 03/25/2011    Priority: Medium  . Insomnia 09/15/2010    Priority: Medium  . Hypercholesterolemia 09/15/2010    Priority: Medium  . History of cardioversion 08/20/2016    Priority: Low  . Palpitations 12/13/2014    Priority: Low  . Constipation 11/06/2012    Priority: Low  . Accelerated/malignant hypertension 09/14/2012    Priority: Low  . Anemia 06/10/2011    Priority: Low  . Stage I breast cancer, left ([place]) 07/22/2017    Medications- reviewed and updated Current Outpatient Medications  Medication Sig Dispense Refill  . amLODipine (NORVASC) 5 MG tablet Take 1 tablet (5 mg total) by mouth daily. 90 tablet 3  . apixaban (ELIQUIS) 2.5 MG TABS tablet Take 1 tablet (2.5 mg total) by mouth 2 (two) times daily. 60 tablet 4  . benzonatate (TESSALON) 100 MG capsule Take one (1) to two (2) capsules by mouth every eight (8) hours as needed for cough.    . Cholecalciferol 1000 units tablet Take 1,000 Units by mouth daily.    . Cranberry 425 MG CAPS Take 425 mg by mouth 2 (two) times daily.    Marland Kitchen docusate sodium (COLACE) 100 MG capsule Take 100 mg by mouth 2 (two) times daily.    .  furosemide (LASIX) 20 MG tablet Take 1.5 tablets (30 mg total) by mouth daily. 45 tablet 3  . HYDROcodone-acetaminophen (NORCO/VICODIN) 5-325 MG tablet Take 1 tablet by mouth 2 (two) times daily. At 9 Am and 3 PM. Separate by 6 hours from temazepam. Fill today. 60 tablet 0  . HYDROcodone-acetaminophen (NORCO/VICODIN) 5-325 MG tablet Take 1 tablet by mouth 2 (two) times daily. At 9 Am and 3 PM. Separate by 6 hours from temazepam. Fill in 2 months 60 tablet 0  . letrozole (FEMARA) 2.5 MG tablet Take 1 tablet (2.5 mg total) by mouth daily. 90 tablet 3  . losartan (COZAAR) 100 MG tablet Take 1 tablet (100 mg total) by mouth daily. 90 tablet 3  . Multiple Vitamins-Minerals (CEROVITE ADVANCED FORMULA PO) Take 1 tablet by mouth daily. (0800)    . omeprazole (PRILOSEC) 40 MG capsule Take 40 mg by mouth daily.    . ondansetron (ZOFRAN) 4 MG tablet Take 4 mg by mouth every 6 (six) hours as needed for nausea.    . polyethylene glycol (MIRALAX / GLYCOLAX) packet Take 17 g by mouth daily. Taken  Daily Monday to Saturday.    . temazepam (RESTORIL) 15 MG capsule Take 1 capsule (15 mg total) by mouth at bedtime. Should separate from hydrocodone by at least 6 hours. 30 capsule 2    Objective:  BP (!) 146/76   Pulse (!) 59   Temp 98.2 F (36.8 C) (Oral)   Ht 5\' 1"  (1.549 m)   Wt 122 lb 6.1 oz (55.5 kg)   SpO2 97%   BMI 23.12 kg/m  Gen: NAD, resting comfortably CV: RRR no murmurs rubs or gallops Lungs: CTAB no crackles, wheeze, rhonchi Abdomen: soft/nontender/nondistended/normal bowel sounds.  Ext: no edema Neuro: grossly normal, moves all extremities, walks with walker  Assessment/Plan:  Other notes: 1.  Had flu shot at [facility]-asked them to send [name] the dates.  Essential hypertension S: controlled typically on  losartan 100mg , amlodipine 5 mg, lasix 30 mg Home #s have been in 120s for most part. Chicken marsalla for lunch- may have been pretty salty.  BP Readings from Last 3 Encounters:   02/09/18 (!) 146/76  12/19/17 126/70  11/10/17 124/76  A/P: We discussed blood pressure goal of <140/90. Continue current meds: Since she has been well controlled at home-also has been well controlled here in the office the last few visits- we discussed possibly increased salt today- if blood pressure is high at home should return to see [name]  Osteoarthritis S: Patient with continued pain particularly in her low back-also has some finger pain.  Pain gets severe with walking- does ok at rest generally as long as has hydrocodone available- hydrocodone also makes walking more tolerable. A/P: Stable-continue one hydrocodone twice a day- counseled her again on why we have to separate temazepam by 6 hours due to fall risk -Patient already taking balance classes at [facility] so do not want to further increase fall risk by stacking opiates and benzodiazepines within 6 hours -her facility [facility]  wants [name] to print the prescription instead of electronically sending- I have printed these 3 for her -we reviewed [facility] today together- no high risk use patterns  -UDS completed May 2019 -Controlled substance contract filled out May 2019 - I asked her to go ahead and schedule III month follow-up  Chronic diastolic HF (heart failure) ([place]) S: Appears euvolemic on Lasix 30 mg. A/P: Stable-continue current medications.  Consider updating labs including bmet or CMET at next visit.  Future Appointments  Date Time Provider [place]  02/13/2018  2:15 PM Nicholas Lose, MD [facility] None  03/20/2018 10:20 AM Lelon Perla, MD [facility]  05/16/2018 11:00 AM Marin Olp, MD [facility]   Return in about 3 months (around 05/12/2018) for follow up- or sooner if needed.   Meds ordered this encounter  Medications  . HYDROcodone-acetaminophen (NORCO/VICODIN) 5-325 MG tablet    Sig: Take 1 tablet by mouth 2 (two) times daily. At 9 Am and 3 PM. Separate by 6 hours from temazepam.  Fill in 2 months    Dispense:  60 tablet    Refill:  0  . HYDROcodone-acetaminophen (NORCO/VICODIN) 5-325 MG tablet    Sig: Take 1 tablet by mouth 2 (two) times daily. At 9 Am and 3 PM. Separate by 6 hours from temazepam. Fill today.    Dispense:  60 tablet    Refill:  0  . HYDROcodone-acetaminophen (NORCO/VICODIN) 5-325 MG tablet    Sig: Take 1 tablet by mouth 2 (two) times daily. At 9 Am and 3 PM. Separate by 6 hours from temazepam. Fill in 1 month    Dispense:  60 tablet    Refill:  0   Return precautions advised.  Garret Reddish, MD

## 2018-02-09 NOTE — Assessment & Plan Note (Signed)
S: controlled typically on  losartan 100mg , amlodipine 5 mg, lasix 30 mg Home #s have been in 120s for most part. Chicken mars[place] for lunch- may have been pretty salty.  BP Readings from Last 3 Encounters:  02/09/18 (!) 146/76  12/19/17 126/70  11/10/17 124/76  A/P: We discussed blood pressure goal of <140/90. Continue current meds: Since she has been well controlled at home-also has been well controlled here in the office the last few visits- we discussed possibly increased salt today- if blood pressure is high at home should return to see [name]

## 2018-02-09 NOTE — Assessment & Plan Note (Signed)
S: Patient with continued pain particularly in her low back-also has some finger pain.  Pain gets severe with walking- does ok at rest generally as long as has hydrocodone available- hydrocodone also makes walking more tolerable. A/P: Stable-continue one hydrocodone twice a day- counseled her again on why we have to separate temazepam by 6 hours due to fall risk -Patient already taking balance classes at [facility] so do not want to further increase fall risk by stacking opiates and benzodiazepines within 6 hours -her facility [facility]  wants [name] to print the prescription instead of electronically sending- I have printed these 3 for her -we reviewed [place] today together- no high risk use patterns  -UDS completed May 2019 -Controlled substance contract filled out May 2019 - I asked her to go ahead and schedule III month follow-up

## 2018-02-13 ENCOUNTER — Inpatient Hospital Stay: Payer: Medicare Other | Attending: Hematology and Oncology | Admitting: Hematology and Oncology

## 2018-02-13 ENCOUNTER — Telehealth: Payer: Self-pay | Admitting: Hematology and Oncology

## 2018-02-13 DIAGNOSIS — Z79899 Other long term (current) drug therapy: Secondary | ICD-10-CM | POA: Insufficient documentation

## 2018-02-13 DIAGNOSIS — Z170 Estrogen receptor positive status [ER+]: Secondary | ICD-10-CM | POA: Insufficient documentation

## 2018-02-13 DIAGNOSIS — C50412 Malignant neoplasm of upper-outer quadrant of left female breast: Secondary | ICD-10-CM | POA: Insufficient documentation

## 2018-02-13 DIAGNOSIS — Z9012 Acquired absence of left breast and nipple: Secondary | ICD-10-CM | POA: Diagnosis not present

## 2018-02-13 DIAGNOSIS — Z7901 Long term (current) use of anticoagulants: Secondary | ICD-10-CM

## 2018-02-13 DIAGNOSIS — Z79811 Long term (current) use of aromatase inhibitors: Secondary | ICD-10-CM | POA: Diagnosis not present

## 2018-02-13 NOTE — Telephone Encounter (Signed)
Gave patient avs and calendar.  [facility] will call patient with mammogram appt

## 2018-02-13 NOTE — Assessment & Plan Note (Signed)
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  ER 100%, PR 100%, Ki-67 70%, HER-2 positive ratio 2.03, gene copy #9.25, T2 N0 stage Ib 07/22/2017:Left mastectomy: Grade 3 multifocal IDC with DCIS, 3.9 cm and 0.8 cm, margins negative, ER 100%, PR 100%, HER-2 positive ratio 2.03, T2 N0 stage Ia  (Even though patient is HER-2 positive, given her advanced age, I did not recommend anti-HER-2 therapy.)   Current treatment: Letrozole 2.5 mg daily started 07/29/2017  Letrozole toxicities:  Return to clinic in 1 year for follow-up

## 2018-02-13 NOTE — Progress Notes (Signed)
Patient Care Team: Marin Olp, MD as PCP - General (Family Medicine) Darlin Coco, MD (Cardiology) Garald Balding, MD (Orthopedic Surgery) Nicholas Lose, MD as Consulting Physician (Hematology and Oncology) Excell Seltzer, MD as Consulting Physician (General Surgery) Delice Bison Charlestine Massed, NP as Nurse Practitioner (Hematology and Oncology)  DIAGNOSIS:  Encounter Diagnosis  Name Primary?  . Malignant neoplasm of upper-outer quadrant of left breast in female, estrogen receptor positive ([place])     SUMMARY OF ONCOLOGIC HISTORY:   Malignant neoplasm of upper-outer quadrant of left breast in female, estrogen receptor positive ([place])   06/07/2017 Initial Diagnosis    Suspicious mass left breast 1 o'clock position at the site of the palpable lump 2.3 x 1.2 x 3.8 cm, 1 cm away is some mild satellite lesions 7 x 5 x 5 mm, no axillary lymph nodes biopsy revealed IDC with DCIS grade 3.  ER 100%, PR 100%, Ki-67 70%, HER-2 positive ratio 2.03, gene copy #9.25, T2 N0 stage Ib    07/22/2017 Surgery    Left mastectomy: Grade 3 multifocal IDC with DCIS, 3.9 cm and 0.8 cm, margins negative, ER 100%, PR 100%, HER-2 positive ratio 2.03, T2 N0 stage Ia     07/2017 -  Anti-estrogen oral therapy    Letrozole daily     CHIEF COMPLIANT: Follow-up on letrozole therapy  INTERVAL HISTORY: Glenda [name] is a 84 year old with above-mentioned history of HER-2 positive left breast cancer treated with mastectomy.  She is currently on oral antiestrogen therapy with letrozole.  She is tolerating it extremely well.  She does not have any adverse effects.  She has intermittent eye discomfort in the left chest wall from prior surgery.  REVIEW OF SYSTEMS:   Constitutional: Denies fevers, chills or abnormal weight loss Eyes: Denies blurriness of vision Ears, nose, mouth, throat, and face: Denies mucositis or sore throat Respiratory: Denies cough, dyspnea or wheezes Cardiovascular: Denies  palpitation, chest discomfort Gastrointestinal:  Denies nausea, heartburn or change in bowel habits Skin: Denies abnormal skin rashes Lymphatics: Denies new lymphadenopathy or easy bruising Neurological:Denies numbness, tingling or new weaknesses Behavioral/Psych: Mood is stable, no new changes  Extremities: Using a walker to get around Breast: Left mastectomy scar tissue feels lumpy and nodular and she is not happy about the scar All other systems were reviewed with the patient and are negative.  I have reviewed the past medical history, past surgical history, social history and family history with the patient and they are unchanged from previous note.  ALLERGIES:  is allergic to ace inhibitors; halcion [triazolam]; pentazocine lactate; sulfa drugs cross reactors; amitriptyline; avelox [moxifloxacin hcl in nacl]; and penicillins.  MEDICATIONS:  Current Outpatient Medications  Medication Sig Dispense Refill  . amLODipine (NORVASC) 5 MG tablet Take 1 tablet (5 mg total) by mouth daily. 90 tablet 3  . apixaban (ELIQUIS) 2.5 MG TABS tablet Take 1 tablet (2.5 mg total) by mouth 2 (two) times daily. 60 tablet 4  . benzonatate (TESSALON) 100 MG capsule Take one (1) to two (2) capsules by mouth every eight (8) hours as needed for cough.    . Cholecalciferol 1000 units tablet Take 1,000 Units by mouth daily.    . Cranberry 425 MG CAPS Take 425 mg by mouth 2 (two) times daily.    Marland Kitchen docusate sodium (COLACE) 100 MG capsule Take 100 mg by mouth 2 (two) times daily.    . furosemide (LASIX) 20 MG tablet Take 1.5 tablets (30 mg total) by mouth daily. 45 tablet  3  . HYDROcodone-acetaminophen (NORCO/VICODIN) 5-325 MG tablet Take 1 tablet by mouth 2 (two) times daily. At 9 Am and 3 PM. Separate by 6 hours from temazepam. Fill in 2 months 60 tablet 0  . HYDROcodone-acetaminophen (NORCO/VICODIN) 5-325 MG tablet Take 1 tablet by mouth 2 (two) times daily. At 9 Am and 3 PM. Separate by 6 hours from temazepam.  Fill today. 60 tablet 0  . HYDROcodone-acetaminophen (NORCO/VICODIN) 5-325 MG tablet Take 1 tablet by mouth 2 (two) times daily. At 9 Am and 3 PM. Separate by 6 hours from temazepam. Fill in 1 month 60 tablet 0  . letrozole (FEMARA) 2.5 MG tablet Take 1 tablet (2.5 mg total) by mouth daily. 90 tablet 3  . losartan (COZAAR) 100 MG tablet Take 1 tablet (100 mg total) by mouth daily. 90 tablet 3  . Multiple Vitamins-Minerals (CEROVITE ADVANCED FORMULA PO) Take 1 tablet by mouth daily. (0800)    . omeprazole (PRILOSEC) 40 MG capsule Take 40 mg by mouth daily.    . ondansetron (ZOFRAN) 4 MG tablet Take 4 mg by mouth every 6 (six) hours as needed for nausea.    . polyethylene glycol (MIRALAX / GLYCOLAX) packet Take 17 g by mouth daily. Taken  Daily Monday to Saturday.    . temazepam (RESTORIL) 15 MG capsule Take 1 capsule (15 mg total) by mouth at bedtime. Should separate from hydrocodone by at least 6 hours. 30 capsule 2   No current facility-administered medications for this visit.     PHYSICAL EXAMINATION: ECOG PERFORMANCE STATUS: 1 - Symptomatic but completely ambulatory  Vitals:   02/13/18 1359  BP: (!) 147/70  Pulse: 67  Resp: 18  Temp: (!) 97.4 F (36.3 C)  SpO2: 97%   Filed Weights   02/13/18 1359  Weight: 121 lb 11.2 oz (55.2 kg)    GENERAL:alert, no distress and comfortable SKIN: skin color, texture, turgor are normal, no rashes or significant lesions EYES: normal, Conjunctiva are pink and non-injected, sclera clear OROPHARYNX:no exudate, no erythema and lips, buccal mucosa, and tongue normal  NECK: supple, thyroid normal size, non-tender, without nodularity LYMPH:  no palpable lymphadenopathy in the cervical, axillary or inguinal LUNGS: clear to auscultation and percussion with normal breathing effort HEART: regular rate & rhythm and no murmurs and no lower extremity edema ABDOMEN:abdomen soft, non-tender and normal bowel sounds MUSCULOSKELETAL:no cyanosis of digits and no  clubbing  NEURO: alert & oriented x 3 with fluent speech, no focal motor/sensory deficits EXTREMITIES: No lower extremity edema BREAST: Left mastectomy scar without any palpable lumps or nodules.  Right breast no lumps or nodules (exam performed in the presence of a chaperone)  LABORATORY DATA:  I have reviewed the data as listed CMP Latest Ref Rng & Units 07/23/2017 07/12/2017 02/11/2017  Glucose 65 - 99 mg/dL 158(H) 88 104(H)  BUN 6 - 20 mg/dL 17 24(H) 28(H)  Creatinine 0.44 - 1.00 mg/dL 0.78 1.00 0.86  Sodium 135 - 145 mmol/L 134(L) 135 134(L)  Potassium 3.5 - 5.1 mmol/L 4.6 4.0 5.0  Chloride 101 - 111 mmol/L 99(L) 100(L) 99(L)  CO2 22 - 32 mmol/L _0 Calcium 8.9 - 10.3 mg/dL 8.7(L) 9.0 9.2  Total Protein 6.5 - 8.1 g/dL - - -  Total Bilirubin 0.3 - 1.2 mg/dL - - -  Alkaline Phos 38 - 126 U/L - - -  AST 15 - 41 U/L - - -  ALT 14 - 54 U/L - - -    Lab  Results  Component Value Date   WBC 6.7 07/23/2017   HGB 11.6 (L) 07/23/2017   HCT 34.8 (L) 07/23/2017   MCV 89.9 07/23/2017   PLT 180 07/23/2017   NEUTROABS 3.5 08/19/2016    ASSESSMENT & PLAN:  Malignant neoplasm of upper-outer quadrant of left breast in female, estrogen receptor positive ([place]) 06/07/2017 suspicious mass left breast 1 o'clock position at the site of the palpable lump 2.3 x 1.2 x 3.8 cm, 1 cm away is some mild satellite lesions 7 x 5 x 5 mm, no axillary lymph nodes biopsy revealed IDC with DCIS grade 3.  ER 100%, PR 100%, Ki-67 70%, HER-2 positive ratio 2.03, gene copy #9.25, T2 N0 stage Ib 07/22/2017:Left mastectomy: Grade 3 multifocal IDC with DCIS, 3.9 cm and 0.8 cm, margins negative, ER 100%, PR 100%, HER-2 positive ratio 2.03, T2 N0 stage Ia  (Even though patient is HER-2 positive, given her advanced age, I did not recommend anti-HER-2 therapy.)   Current treatment: Letrozole 2.5 mg daily started 07/29/2017  Letrozole toxicities: Denies any hot flashes or arthralgias or myalgias Breast cancer  surveillance: Left chest mastectomy no palpable lumps or nodules of concern.  Patient is not very happy with the scar tissue. No palpable lumps or nodules in the right breast or axilla.  Return to clinic in 6 months for follow-up and after that we can see her once a year    Orders Placed This Encounter  Procedures  . MM DIAG BREAST TOMO UNI RIGHT    Standing Status:   Future    Standing Expiration Date:   02/14/2019    Order Specific Question:   Reason for Exam (SYMPTOM  OR DIAGNOSIS REQUIRED)    Answer:   Annual mammogram right breast with [place] of left breast cancer    Order Specific Question:   Preferred imaging location?    Answer:   [facility]   The patient has a good understanding of the overall plan. she agrees with it. she will call with any problems that may develop before the next visit here.   Harriette Ohara, MD 02/13/18

## 2018-03-07 NOTE — Progress Notes (Signed)
HPI: FU hypertension and atrial flutter. History of atypical chest pain and palpitations. She had a exercise treadmill test on 01/26/11 which showed no evidence of ischemia. 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 Admitted July 2017 with atrial flutter vs atrial tachycardia and underwent TEE guided cardioversion. Echocardiogram October 2017 showed normal LV function, mild aortic insufficiency and mitral regurgitation and mild left atrial enlargement.Patient had recurrent atrial flutter May 2018 and had repeat cardioversion. Appointment was scheduled to see Dr. Lovena Le for consideration of ablation but he was not availableandseen by[place]e [place]; she was inclined not to pursue procedures. Monitor March 2019 showed sinus with PACs and PVCs but no atrial flutter.  Since last seen,  there is no dyspnea, chest pain or syncope.  She has occasional palpitations unchanged.  Current Outpatient Medications  Medication Sig Dispense Refill  . amLODipine (NORVASC) 5 MG tablet Take 1 tablet (5 mg total) by mouth daily. 90 tablet 3  . apixaban (ELIQUIS) 2.5 MG TABS tablet Take 1 tablet (2.5 mg total) by mouth 2 (two) times daily. 60 tablet 4  . benzonatate (TESSALON) 100 MG capsule Take one (1) to two (2) capsules by mouth every eight (8) hours as needed for cough.    . Cholecalciferol 1000 units tablet Take 1,000 Units by mouth daily.    . Cranberry 425 MG CAPS Take 425 mg by mouth 2 (two) times daily.    Marland Kitchen docusate sodium (COLACE) 100 MG capsule Take 100 mg by mouth 2 (two) times daily.    . furosemide (LASIX) 20 MG tablet Take 1.5 tablets (30 mg total) by mouth daily. 45 tablet 3  . HYDROcodone-acetaminophen (NORCO/VICODIN) 5-325 MG tablet Take 1 tablet by mouth 2 (two) times daily. At 9 Am and 3 PM. Separate by 6 hours from temazepam. Fill in 2 months 60 tablet 0  . HYDROcodone-acetaminophen (NORCO/VICODIN) 5-325 MG tablet Take 1 tablet by mouth 2 (two) times daily. At 9 Am and 3 PM. Separate by 6 hours from temazepam.  Fill today. 60 tablet 0  . HYDROcodone-acetaminophen (NORCO/VICODIN) 5-325 MG tablet Take 1 tablet by mouth 2 (two) times daily. At 9 Am and 3 PM. Separate by 6 hours from temazepam. Fill in 1 month 60 tablet 0  . letrozole (FEMARA) 2.5 MG tablet Take 1 tablet (2.5 mg total) by mouth daily. 90 tablet 3  . losartan (COZAAR) 100 MG tablet Take 1 tablet (100 mg total) by mouth daily. 90 tablet 3  . Multiple Vitamins-Minerals (CEROVITE ADVANCED FORMULA PO) Take 1 tablet by mouth daily. (0800)    . omeprazole (PRILOSEC) 40 MG capsule Take 40 mg by mouth daily.    . ondansetron (ZOFRAN) 4 MG tablet Take 4 mg by mouth every 6 (six) hours as needed for nausea.    . polyethylene glycol (MIRALAX / GLYCOLAX) packet Take 17 g by mouth daily. Taken  Daily Monday to Saturday.    . temazepam (RESTORIL) 15 MG capsule Take 1 capsule (15 mg total) by mouth at bedtime. Should separate from hydrocodone by at least 6 hours. 30 capsule 2   No current facility-administered medications for this visit.      Past Medical History:  Diagnosis Date  . Aortic stenosis    Echo 02/2011 showing mild AS with normal LV systolic function  . Arthritis    "lower back" (10/09/2015)  . Atrial flutter with rapid ventricular response ([place]) 08/19/2016   s/p successful DCCV on 08/20/16, continue eliquis  . Chronic bronchitis ([place])    "  off and on; several years" (10/09/2015)  . Chronic diastolic CHF (congestive heart failure) ([place])    a. 12/2015 Echo: EF 55-60%, mild AI/MR, mildly dil LA.  Marland Kitchen Chronic lower back pain   . Complication of anesthesia    "they had trouble waking me up after colon resection"  . Confusion    Originally listed as TIA-pt denies this hx on 10/09/2015 "it was the [place] I was taking; they had thought I was having a st originally roke"  . DDD (degenerative disc disease), lumbar    severe facet dz and adv DDD MRI L spine 2009  . Diverticulosis   . GERD (gastroesophageal reflux disease)   . Hiatal hernia    hx    . Hypercholesterolemia   . Hypertension   . Insomnia   . OA (osteoarthritis) of knee   . Paroxysmal atrial flutter ([place])    a. 09/2015 s/p TEE/DCCV;  b. CHA2DS2VASc = 5-->Eliquis.  . Peripheral edema   . PVCs (premature ventricular contractions)   . Sacral fracture ([place]) 11/06/2012  . Vertigo 11/19/2012    Past Surgical History:  Procedure Laterality Date  . BREAST BIOPSY Left   . CARDIAC CATHETERIZATION  02/17/2001   MILD REGURGITATION. EF 60%  . CARDIOVERSION N/A 10/08/2015   Procedure: CARDIOVERSION;  Surgeon: Sanda Klein, MD;  Location: [facility];  Service: Cardiovascular;  Laterality: N/A;  . CARDIOVERSION N/A 08/20/2016   Procedure: Cardioversion;  Surgeon: Evans Lance, MD;  Location: [facility];  Service: Cardiovascular;  Laterality: N/A;  . CATARACT EXTRACTION W/ INTRAOCULAR LENS  IMPLANT, BILATERAL Bilateral   . CESAREAN SECTION  1954  . COLECTOMY     related to "blockage"  . COLONOSCOPY    . EXCISIONAL HEMORRHOIDECTOMY    . INGUINAL HERNIA REPAIR Bilateral   . KYPHOPLASTY Right 09/11/2012   Procedure: Right Acrylic Sacroplasty;  Surgeon: Kristeen Miss, MD;  Location: [facility];  Service: Neurosurgery;  Laterality: Right;  Right  Acrylic Sacroplasty  . TEE WITHOUT CARDIOVERSION N/A 10/08/2015   Procedure: TRANSESOPHAGEAL ECHOCARDIOGRAM (TEE);  Surgeon: Sanda Klein, MD;  Location[facility];  Service: Cardiovascular;  Laterality: N/A;  . TOTAL KNEE ARTHROPLASTY Right   . TOTAL MASTECTOMY Left 07/22/2017   Procedure: TOTAL MASTECTOMY;  Surgeon: Excell Seltzer, MD;  Location: [facility];  Service: General;  Laterality: Left;  . UMBILICAL HERNIA REPAIR      Social History   Socioeconomic History  . Marital status: Widowed    Spouse name: Not on file  . Number of children: Not on file  . Years of education: Not on file  . Highest education level: Not on file  Occupational History  . Not on file  Social Needs  . Financial resource strain: Not on  file  . Food insecurity:    Worry: Not on file    Inability: Not on file  . Transportation needs:    Medical: Not on file    Non-medical: Not on file  Tobacco Use  . Smoking status: Never Smoker  . Smokeless tobacco: Never Used  Substance and Sexual Activity  . Alcohol use: No  . Drug use: No  . Sexual activity: Never  Lifestyle  . Physical activity:    Days per week: Not on file    Minutes per session: Not on file  . Stress: Not on file  Relationships  . Social connections:    Talks on phone: Not on file    Gets together: Not on file  Attends religious service: Not on file    Active member of club or organization: Not on file    Attends meetings of clubs or organizations: Not on file    Relationship status: Not on file  . Intimate partner violence:    Fear of current or ex partner: Not on file    Emotionally abused: Not on file    Physically abused: Not on file    Forced sexual activity: Not on file  Other Topics Concern  . 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 Heart disease Mother   . Hypertension Father   . Other Sister        6 in 2019  . Other Sister        21 in 2019  . Heart disease Sister   . Heart attack Neg Hx   . Stroke Neg Hx     ROS: no fevers or chills, productive cough, hemoptysis, dysphasia, odynophagia, melena, hematochezia, dysuria, hematuria, rash, seizure activity, orthopnea, PND, pedal edema, claudication. Remaining systems are negative.  Physical Exam: Well-developed well-nourished in no acute distress.  Skin is warm and dry.  HEENT is normal.  Neck is  supple.  Chest is clear to auscultation with normal expansion.  Cardiovascular exam is regular rate and rhythm. 2/6 systolic murmur Abdominal exam nontender or distended. No masses palpated. Extremities show no edema. neuro grossly intact  ECG-sinus rhythm with first-degree AV block, no ST changes.  Right axis deviation.  Personally reviewed  A/P  1 history of atrial flutter-patient remains in sinus rhythm today.  We will continue with apixaban.  Check hemoglobin and renal function.  Beta-blocker discontinued previously because of bradycardia.  2 hypertension-patient's blood pressure is mildly elevated.  Increase amlodipine to 10 mg daily and follow.  3 palpitations-PACs and PVCs noted on previous monitor.  I have not added a beta-blocker as this was discontinued previously because of bradycardia.  4 chronic diastolic congestive heart failure-she is euvolemic on examination.  Continue present dose of diuretic.  Check potassium and renal function.  5 hyperlipidemia-continue statin. Check LFTs.  6 history of mild aortic stenosis-not evident on most recent echo.  Kirk Ruths, MD

## 2018-03-20 ENCOUNTER — Ambulatory Visit (INDEPENDENT_AMBULATORY_CARE_PROVIDER_SITE_OTHER): Payer: Medicare Other | Admitting: Cardiology

## 2018-03-20 ENCOUNTER — Encounter: Payer: Self-pay | Admitting: Cardiology

## 2018-03-20 VITALS — BP 148/60 | HR 57 | Ht 61.0 in | Wt 121.6 lb

## 2018-03-20 DIAGNOSIS — R002 Palpitations: Secondary | ICD-10-CM

## 2018-03-20 DIAGNOSIS — I10 Essential (primary) hypertension: Secondary | ICD-10-CM | POA: Diagnosis not present

## 2018-03-20 DIAGNOSIS — I4892 Unspecified atrial flutter: Secondary | ICD-10-CM

## 2018-03-20 LAB — BASIC METABOLIC PANEL
BUN/Creatinine Ratio: 31 — ABNORMAL HIGH (ref 12–28)
BUN: 27 mg/dL (ref 10–36)
CO2: 24 mmol/L (ref 20–29)
Calcium: 9.6 mg/dL (ref 8.7–10.3)
Chloride: 97 mmol/L (ref 96–106)
Creatinine, Ser: 0.88 mg/dL (ref 0.57–1.00)
GFR calc Af Amer: 66 mL/min/{1.73_m2} (ref >59)
GFR calc non Af Amer: 58 mL/min/{1.73_m2} — ABNORMAL LOW (ref >59)
Glucose: 89 mg/dL (ref 65–99)
Potassium: 4.3 mmol/L (ref 3.5–5.2)
Sodium: 137 mmol/L (ref 134–144)

## 2018-03-20 LAB — HEPATIC FUNCTION PANEL
ALT: 9 IU/L (ref 0–32)
AST: 19 IU/L (ref 0–40)
Albumin: 4.3 g/dL (ref 3.2–4.6)
Alkaline Phosphatase: 89 IU/L (ref 39–117)
Bilirubin Total: 0.5 mg/dL (ref 0.0–1.2)
Bilirubin, Direct: 0.12 mg/dL (ref 0.00–0.40)
Total Protein: 7.1 g/dL (ref 6.0–8.5)

## 2018-03-20 LAB — CBC
Hematocrit: 37.6 % (ref 34.0–46.6)
Hemoglobin: 12.7 g/dL (ref 11.1–15.9)
MCH: 29.6 pg (ref 26.6–33.0)
MCHC: 33.8 g/dL (ref 31.5–35.7)
MCV: 88 fL (ref 79–97)
Platelets: 243 10*3/uL (ref 150–450)
RBC: 4.29 x10E6/uL (ref 3.77–5.28)
RDW: 12.6 % (ref 12.3–15.4)
WBC: 5.7 10*3/uL (ref 3.4–10.8)

## 2018-03-20 MED ORDER — AMLODIPINE BESYLATE 10 MG PO TABS: 10.0000 mg | ORAL_TABLET | Freq: Every day | ORAL | 3 refills | Status: DC

## 2018-03-20 NOTE — Patient Instructions (Signed)
Medication Instructions:  INCREASE AMLODIPINE TO 10 MG ONCE DAILY If you need a refill on your cardiac medications before your next appointment, please call your pharmacy.   Lab work: Your physician recommends that you HAVE LAB WORK TODAY If you have labs (blood work) drawn today and your tests are completely normal, you will receive your results only by: Marland Kitchen MyChart Message (if you have MyChart) OR . A paper copy in the mail If you have any lab test that is abnormal or we need to change your treatment, we will call you to review the results.  Follow-Up: At [facility], you and your health needs are our priority.  As part of our continuing mission to provide you with exceptional heart care, we have created designated Provider Care Teams.  These Care Teams include your primary Cardiologist (physician) and Advanced Practice Providers (APPs -  Physician Assistants and Nurse Practitioners) who all work together to provide you with the care you need, when you need it. You will need a follow up appointment in 6 months.  Please call our office 2 months in advance to schedule this appointment.  You may see Kirk Ruths MD or one of the following Advanced Practice Providers on your designated Care Team:   Kerin Ransom, PA-C Roby Lofts, [place] . Sande Rives, PA-C  CALL IN [facility] FOR APPOINTMENT IN [place]

## 2018-03-21 ENCOUNTER — Encounter: Payer: Self-pay | Admitting: *Deleted

## 2018-03-24 ENCOUNTER — Telehealth: Payer: Self-pay | Admitting: Cardiology

## 2018-03-24 NOTE — Telephone Encounter (Signed)
Returned call to [place] at [facility].She stated patient has been complaining of dizziness since Amlodipine increased to 10 mg.B/P 119/49 this morning.Advised ok to decrease Amlodipine back to 5 mg daily. Faxed to Helene Kelp at fax # [phone].

## 2018-03-24 NOTE — Telephone Encounter (Signed)
New message   STAT if patient feels like he/she is going to faint   1) Are you dizzy now? yes  2) Do you feel faint or have you passed out? No   3) Do you have any other symptoms? No   4) Have you checked your HR and BP (record if available)?n/a

## 2018-03-31 ENCOUNTER — Telehealth: Payer: Self-pay | Admitting: Cardiology

## 2018-03-31 MED ORDER — AMLODIPINE BESYLATE 5 MG PO TABS: 5.0000 mg | ORAL_TABLET | Freq: Every day | ORAL | 3 refills | Status: DC

## 2018-03-31 NOTE — Telephone Encounter (Signed)
New Message         [facility] is calling back for a written Rx of "Amolodipine" they have not received the Rx yet and patient is still compiling Fax # [phone]. Pls call and advise

## 2018-03-31 NOTE — Telephone Encounter (Signed)
Script signed by Almyra Deforest pa-c and faxed to the number provided.

## 2018-05-03 ENCOUNTER — Telehealth: Payer: Self-pay

## 2018-05-03 NOTE — Telephone Encounter (Signed)
Copied from [place] [id number]. Topic: General - Other >> May 03, 2018 11:45 AM Bea Graff, NT wrote: Reason for CRM: Helene Kelp with [facility] states the signed physician order sheet they received for this pt cut the bottom part off and they need this resent if possible. CB#: [phone]

## 2018-05-03 NOTE — Telephone Encounter (Signed)
Please advise 

## 2018-05-08 NOTE — Telephone Encounter (Signed)
I have looked through all documents and can not find form to refax.

## 2018-05-11 ENCOUNTER — Other Ambulatory Visit: Payer: Self-pay | Admitting: Family Medicine

## 2018-05-11 NOTE — Telephone Encounter (Signed)
Copied from [place] [id number]. Topic: Quick Communication - Rx Refill/Question >> May 11, 2018  9:45 AM Alanda Slim E wrote: Medication: HYDROcodone-acetaminophen (NORCO/VICODIN) 5-325 MG tablet  Has the patient contacted their pharmacy? yes   Preferred Pharmacy (with phone number or street name): [facility] - [place], [place] [address] [phone] (Phone) [phone] (Fax)    Agent: Please be advised that RX refills may take up to 3 business days. We ask that you follow-up with your pharmacy.

## 2018-05-11 NOTE — Telephone Encounter (Signed)
Has visit on 25th- narcotics for non cancer/hospice patients need to only be filled at office visit. I prefer for narcotic requests to be scheduled and not sent to me if at all possible . Sending to [place] as well as [employer]

## 2018-05-11 NOTE — Telephone Encounter (Signed)
See note

## 2018-05-11 NOTE — Telephone Encounter (Signed)
Okay for refill? Please advise 

## 2018-05-12 ENCOUNTER — Other Ambulatory Visit: Payer: Self-pay | Admitting: Family Medicine

## 2018-05-12 MED ORDER — HYDROCODONE-ACETAMINOPHEN 5-325 MG PO TABS: 1.0000 | ORAL_TABLET | Freq: Two times a day (BID) | ORAL | 0 refills | Status: DC

## 2018-05-12 NOTE — Addendum Note (Signed)
Addended by: Marin Olp on: 05/12/2018 06:00 PM   Modules accepted: Orders

## 2018-05-12 NOTE — Telephone Encounter (Signed)
Pt wants to know if there sis something that she an take for pain to gold her until her appt. Pt was advise that she can take Tylenol. Pt stated that it does not help and wants to know of an alternative.  Please advise

## 2018-05-12 NOTE — Telephone Encounter (Signed)
Okay for refill? Please advise 

## 2018-05-12 NOTE — Telephone Encounter (Signed)
Noted  

## 2018-05-12 NOTE — Telephone Encounter (Signed)
See other form of refill request- I have already responded to this- needs office visit for refill of all narcotics.

## 2018-05-12 NOTE — Telephone Encounter (Signed)
Copied from [place] [id number]. Topic: Quick Communication - Rx Refill/Question >> May 12, 2018 10:41 AM Windy Kalata wrote: Medication: HYDROcodone-acetaminophen (NORCO/VICODIN) 5-325 MG tablet  Has the patient contacted their pharmacy? Yes.   (Agent: If no, request that the patient contact the pharmacy for the refill.) (Agent: If yes, when and what did the pharmacy advise?) Called office  Preferred Pharmacy (with phone number or street name): [facility] - [place], [place] [address] [phone] (Phone) [phone] (Fax)    Agent: Please be advised that RX refills may take up to 3 business days. We ask that you follow-up with your pharmacy.

## 2018-05-12 NOTE — Telephone Encounter (Signed)
I sent in a short supply of medication-we need to make sure to have her schedule visits before she will run out of medication

## 2018-05-12 NOTE — Telephone Encounter (Signed)
See note

## 2018-05-15 NOTE — Telephone Encounter (Signed)
Pt scheduled for an appointment 05/16/18 with Dr. Yong Channel.

## 2018-05-16 ENCOUNTER — Ambulatory Visit: Payer: Medicare Other | Admitting: Family Medicine

## 2018-05-18 ENCOUNTER — Encounter: Payer: Self-pay | Admitting: Family Medicine

## 2018-05-18 ENCOUNTER — Ambulatory Visit (INDEPENDENT_AMBULATORY_CARE_PROVIDER_SITE_OTHER): Payer: Medicare Other | Admitting: Family Medicine

## 2018-05-18 VITALS — BP 150/60 | HR 54 | Temp 98.1°F | Ht 61.0 in | Wt 120.5 lb

## 2018-05-18 DIAGNOSIS — I4892 Unspecified atrial flutter: Secondary | ICD-10-CM | POA: Diagnosis not present

## 2018-05-18 DIAGNOSIS — M159 Polyosteoarthritis, unspecified: Secondary | ICD-10-CM

## 2018-05-18 DIAGNOSIS — I5032 Chronic diastolic (congestive) heart failure: Secondary | ICD-10-CM

## 2018-05-18 DIAGNOSIS — I10 Essential (primary) hypertension: Secondary | ICD-10-CM | POA: Diagnosis not present

## 2018-05-18 DIAGNOSIS — K458 Other specified abdominal hernia without obstruction or gangrene: Secondary | ICD-10-CM

## 2018-05-18 MED ORDER — HYDROCODONE-ACETAMINOPHEN 5-325 MG PO TABS: 1.0000 | ORAL_TABLET | Freq: Two times a day (BID) | ORAL | 0 refills | Status: DC

## 2018-05-18 MED ORDER — HYDROCODONE-ACETAMINOPHEN 5-325 MG PO TABS: 1.0000 | ORAL_TABLET | Freq: Four times a day (QID) | ORAL | 0 refills | Status: DC | PRN

## 2018-05-18 MED ORDER — SERTRALINE HCL 25 MG PO TABS: 25.0000 mg | ORAL_TABLET | Freq: Every day | ORAL | 3 refills | Status: DC

## 2018-05-18 NOTE — Patient Instructions (Addendum)
Schedule a visit for 08/16/2018 or shortly before so we can make sure you do not run out of medicine again.   No changes today  I am going to call your son and talk over some blood pressure medication options

## 2018-05-18 NOTE — Progress Notes (Signed)
Phone [phone]   Subjective:  Glenda [name] is a 85 y.o. year old very pleasant female patient who presents for/with See problem oriented charting ROS-some right lower quadrant pain.  Continued back pain.  Some mild nausea.  No increased edema.  No recent weight gain.  Past Medical History-  Patient Active Problem List   Diagnosis Date Noted  . Chronic pain syndrome 02/09/2018    Priority: High  . Chronic narcotic use 08/11/2017    Priority: High  . Malignant neoplasm of upper-outer quadrant of left breast in female, estrogen receptor positive ([place]) 06/27/2017    Priority: High  . Atrial flutter ([place])     Priority: High  . Chronic diastolic HF (heart failure) ([place]) 07/11/2012    Priority: High  . Osteoarthritis 04/03/2012    Priority: High  . Abdominal hernia 05/19/2018    Priority: Medium  . Shortness of breath 12/13/2014    Priority: Medium  . Aortic stenosis, mild 07/11/2012    Priority: Medium  . Essential hypertension 03/25/2011    Priority: Medium  . Insomnia 09/15/2010    Priority: Medium  . Hypercholesterolemia 09/15/2010    Priority: Medium  . History of cardioversion 08/20/2016    Priority: Low  . Palpitations 12/13/2014    Priority: Low  . Constipation 11/06/2012    Priority: Low  . Accelerated/malignant hypertension 09/14/2012    Priority: Low  . Anemia 06/10/2011    Priority: Low    Medications- reviewed and updated Current Outpatient Medications  Medication Sig Dispense Refill  . amLODipine (NORVASC) 5 MG tablet Take 1 tablet (5 mg total) by mouth daily. 90 tablet 3  . apixaban (ELIQUIS) 2.5 MG TABS tablet Take 1 tablet (2.5 mg total) by mouth 2 (two) times daily. 60 tablet 4  . chlorhexidine (PERIDEX) 0.12 % solution Use as directed 5 mLs in the mouth or throat 2 (two) times daily.     . Cholecalciferol 1000 units tablet Take 1,000 Units by mouth daily.    . Cranberry 425 MG CAPS Take 425 mg by mouth 2 (two) times daily.    Marland Kitchen docusate  sodium (COLACE) 100 MG capsule Take 100 mg by mouth 2 (two) times daily.    . furosemide (LASIX) 20 MG tablet Take 1.5 tablets (30 mg total) by mouth daily. 45 tablet 3  . HYDROcodone-acetaminophen (NORCO/VICODIN) 5-325 MG tablet Take 1 tablet by mouth 2 (two) times daily. May refill today At 9 Am and 3 PM. Separate by 6 hours from temazepam. 60 tablet 0  . letrozole (FEMARA) 2.5 MG tablet Take 1 tablet (2.5 mg total) by mouth daily. 90 tablet 3  . losartan (COZAAR) 100 MG tablet Take 1 tablet (100 mg total) by mouth daily. 90 tablet 3  . Multiple Vitamins-Minerals (CEROVITE ADVANCED FORMULA PO) Take 1 tablet by mouth daily. (0800)    . omeprazole (PRILOSEC) 40 MG capsule Take 40 mg by mouth daily.    . ondansetron (ZOFRAN) 4 MG tablet Take 4 mg by mouth every 6 (six) hours as needed for nausea.    . polyethylene glycol (MIRALAX / GLYCOLAX) packet Take 17 g by mouth daily. Taken  Daily Monday to Saturday.    . temazepam (RESTORIL) 15 MG capsule Take 1 capsule (15 mg total) by mouth at bedtime. Should separate from hydrocodone by at least 6 hours. 30 capsule 2  . HYDROcodone-acetaminophen (NORCO/VICODIN) 5-325 MG tablet Take 1 tablet by mouth 2 (two) times daily. May refill 1 month. At 9 Am and  3 PM. Separate by 6 hours from temazepam. 60 tablet 0  . HYDROcodone-acetaminophen (NORCO/VICODIN) 5-325 MG tablet Take 1 tablet by mouth every 6 (six) hours as needed for moderate pain or severe pain. May refill 2 months. At 9 Am and 3 PM. Separate by 6 hours from temazepam. 60 tablet 0  . sertraline (ZOLOFT) 25 MG tablet Take 1 tablet (25 mg total) by mouth daily. 90 tablet 3   No current facility-administered medications for this visit.      Objective:  BP (!) 150/60 (BP Location: Right Arm, Cuff Size: Normal)   Pulse (!) 54   Temp 98.1 F (36.7 C) (Oral)   Ht 5\' 1"  (1.549 m)   Wt 120 lb 8 oz (54.7 kg)   SpO2 98%   BMI 22.77 kg/m  Gen: NAD, resting comfortably CV: bradycardic but regular no  murmurs rubs or gallops Lungs: CTAB no crackles, wheeze, rhonchi Abdomen: soft/nontender other than over hernia/nondistended/normal bowel sounds. No rebound or guarding. partially reducible hernia- expands to about 5x5 cm with valsalva Ext: minimal edema Skin: warm, dry    Assessment and Plan   % Breast cancer-continues to follow with Dr. Lindi Adie. Some hair loss on femara she is going to ask Dr. Lindi Adie about  Osteoarthritis  including low back/insomnia S: Patient with continued pain particularly in her low back.  Also some finger pain.  Pain worsens with walking-hydrocodone lessens pain some.  She seems to do okay at rest as long as hydrocodone is available. Found therapy helpful - asks about doing some more sessions.   Getting some pain into left leg intermittently- seems to start in the stomach. Gets about two to three times a week. Not particularly worse than her regular back pain.   She uses hydrocodone twice a day and spaces last dose from 6 hours from temazepam to decrease fall risk.  Temazepam remains beneficial  for sleep issues/insomnia A/P: patient doing well when doesn't run out of medication- we have planned to see her back  Right before 3 months to help avoid her running out - I think we should continue therapy- will represcribe -Last UDS May 2019 - [place] today 05/18/2018  -Controlled substance contract May 2019  - also hand written script to restart PT 1-2x a week to see if that continues to benefit her as prior sessions have   Essential hypertension S: Compliant with losartan 100 mg, amlodipine 5 mg, Lasix 30 mg. Home monitoring-140s to 150s for the most part at home.  Some orthostatic symptoms. GFR in 37s  I had a discussion with patient's son Mr. Judee Clara after visit by phone (he has a great relationship with her and she was fine with me calling to discuss BP concerns and follow up plan) and he informs me even checking the blood pressure seems to cause anxiety  for patient- on rechecks can even go up. He thinks she gets anxious about her blood pressure and health and general.  A/P: mild poor control. I really would prefer to keep patient in the 140s and I am concerned about fair number of readings into 150s. On other hand also preload dependent with aortic stenosis though mild so want to be cautious about dropping too low  With that being said I do not think we have a great medication choice for her. She already gets swelling with the amlodipine and with CHF history do not think we can increase. Son reports bradycardic into 40s in past on beta blocker and baseline  in 65s is not a good sign she would tolerate this well anyway. Her BUN/cr ratio is already high and with GFR in 50s I do not think we should stress kidneys further with more lasix or HCTZ. Discussed hydralazine and clonidine options but side effect profile at her age is not idea.   Ultimately her son Mr. Judee Clara who is an Rn asked about using something to help patient with anxiety. We reviewed zoloft 25mg  at low dose together and ultimately opted to trial this- he is going to review this with his mother and let me know if she has any concern about starting this.   We will reassess in 3 months  Chronic diastolic HF (heart failure) (HCC) S: Compliant with Lasix 30 mg.  Hesitant to increase amlodipine due to heart failure.  Edema-minimal today.  Weight-stable  A/P: Stable. Continue current medications.   Atrial flutter ([place]) S:Patient compliant with Eliquis.  Cardioversion 2017 and appears to stay in normal sinus rhythm A/P: Stable. Continue current medications.   Abdominal hernia RLQ hernia noted- patient does not want to have surgery but is aware of emergency precautions. She wanted to make sure this was not a cancerous growth and I reassured her   Future Appointments  Date Time Provider [place]  06/05/2018 10:40 AM [facility] 2 [facility]  08/15/2018  1:45 PM Nicholas Lose, MD [facility] None   3 month follow up needed- will need to call patient to get this scheduled as didn't schedule before she left  Lab/Order associations: Osteoarthritis of multiple joints, unspecified osteoarthritis type  Essential hypertension  Chronic diastolic HF (heart failure) (HCC)  Atrial flutter, unspecified type ([place])  Other specified abdominal hernia without obstruction or gangrene  Meds ordered this encounter  Medications  . HYDROcodone-acetaminophen (NORCO/VICODIN) 5-325 MG tablet    Sig: Take 1 tablet by mouth 2 (two) times daily. May refill today At 9 Am and 3 PM. Separate by 6 hours from temazepam.    Dispense:  60 tablet    Refill:  0  . HYDROcodone-acetaminophen (NORCO/VICODIN) 5-325 MG tablet    Sig: Take 1 tablet by mouth 2 (two) times daily. May refill 1 month. At 9 Am and 3 PM. Separate by 6 hours from temazepam.    Dispense:  60 tablet    Refill:  0  . HYDROcodone-acetaminophen (NORCO/VICODIN) 5-325 MG tablet    Sig: Take 1 tablet by mouth every 6 (six) hours as needed for moderate pain or severe pain. May refill 2 months. At 9 Am and 3 PM. Separate by 6 hours from temazepam.    Dispense:  60 tablet    Refill:  0  . sertraline (ZOLOFT) 25 MG tablet    Sig: Take 1 tablet (25 mg total) by mouth daily.    Dispense:  90 tablet    Refill:  3    Return precautions advised.  Garret Reddish, MD

## 2018-05-19 DIAGNOSIS — K469 Unspecified abdominal hernia without obstruction or gangrene: Secondary | ICD-10-CM | POA: Insufficient documentation

## 2018-05-19 NOTE — Progress Notes (Signed)
Patient is now scheduled for May 19 at 1040am

## 2018-05-19 NOTE — Assessment & Plan Note (Signed)
RLQ hernia noted- patient does not want to have surgery but is aware of emergency precautions. She wanted to make sure this was not a cancerous growth and I reassured her

## 2018-05-19 NOTE — Assessment & Plan Note (Signed)
S:Patient compliant with Eliquis.  Cardioversion 2017 and appears to stay in normal sinus rhythm A/P: Stable. Continue current medications.

## 2018-05-19 NOTE — Assessment & Plan Note (Signed)
S: Compliant with Lasix 30 mg.  Hesitant to increase amlodipine due to heart failure.  Edema-minimal today.  Weight-stable  A/P: Stable. Continue current medications.

## 2018-05-19 NOTE — Assessment & Plan Note (Addendum)
S: Compliant with losartan 100 mg, amlodipine 5 mg, Lasix 30 mg. Home monitoring-140s to 150s for the most part at home.  Some orthostatic symptoms. GFR in 29s  I had a discussion with patient's son Mr. Judee Clara after visit by phone (he has a great relationship with her and she was fine with me calling to discuss BP concerns and follow up plan) and he informs me even checking the blood pressure seems to cause anxiety for patient- on rechecks can even go up. He thinks she gets anxious about her blood pressure and health and general.  A/P: mild poor control. I really would prefer to keep patient in the 140s and I am concerned about fair number of readings into 150s. On other hand also preload dependent with aortic stenosis though mild so want to be cautious about dropping too low  With that being said I do not think we have a great medication choice for her. She already gets swelling with the amlodipine and with CHF history do not think we can increase. Son reports bradycardic into 40s in past on beta blocker and baseline in 12s is not a good sign she would tolerate this well anyway. Her BUN/cr ratio is already high and with GFR in 50s I do not think we should stress kidneys further with more lasix or HCTZ. Discussed hydralazine and clonidine options but side effect profile at her age is not idea.   Ultimately her son Mr. Judee Clara who is an Rn asked about using something to help patient with anxiety. We reviewed zoloft 25mg  at low dose together and ultimately opted to trial this- he is going to review this with his mother and let me know if she has any concern about starting this.   We will reassess in 3 months

## 2018-05-19 NOTE — Assessment & Plan Note (Addendum)
including low back/insomnia S: Patient with continued pain particularly in her low back.  Also some finger pain.  Pain worsens with walking-hydrocodone lessens pain some.  She seems to do okay at rest as long as hydrocodone is available. Found therapy helpful - asks about doing some more sessions.   Getting some pain into left leg intermittently- seems to start in the stomach. Gets about two to three times a week. Not particularly worse than her regular back pain.   She uses hydrocodone twice a day and spaces last dose from 6 hours from temazepam to decrease fall risk.  Temazepam remains beneficial  for sleep issues/insomnia A/P: patient doing well when doesn't run out of medication- we have planned to see her back  Right before 3 months to help avoid her running out - I think we should continue therapy- will represcribe -Last UDS May 2019 - [place] today 05/18/2018  -Controlled substance contract May 2019  - also hand written script to restart PT 1-2x a week to see if that continues to benefit her as prior sessions have

## 2018-06-05 ENCOUNTER — Ambulatory Visit: Payer: Medicare Other

## 2018-06-20 ENCOUNTER — Telehealth: Payer: Self-pay | Admitting: Family Medicine

## 2018-06-20 NOTE — Telephone Encounter (Signed)
Copied from [place] [id number]. Topic: Quick Communication - Rx Refill/Question >> Jun 20, 2018 10:16 AM Robina Ade, Helene Kelp D wrote: Medication: temazepam (RESTORIL) 15 MG capsule  Has the patient contacted their pharmacy? Yes (Agent: If no, request that the patient contact the pharmacy for the refill.) (Agent: If yes, when and what did the pharmacy advise?)  Preferred Pharmacy (with phone number or street name): [facility] - [place], [place]. [name] with the pharmacy called and said that she needs to know if a prior auth for medication has been received? Also they want to know if patient can stay on medication or change for a different one due to how she needs to take it and what insurance will cover. Please call her back at [phone]  Agent: Please be advised that RX refills may take up to 3 business days. We ask that you follow-up with your pharmacy.

## 2018-06-21 NOTE — Telephone Encounter (Signed)
Glenda [name] from rx pharmacy called back to check status of PA. Stated that pt pays cash for meds and it is very expensive and insurance will not cover it. Pt has one more pill for tonight but needs this taken care of ASAP. Please advise.

## 2018-06-21 NOTE — Telephone Encounter (Signed)
See note

## 2018-06-21 NOTE — Telephone Encounter (Signed)
Tried to do a PA but insurance was not up to date. Called pt to get new insurance information however, still not pulling pt up in the data base. Will route for help.   Please assist!

## 2018-06-21 NOTE — Telephone Encounter (Signed)
May refill at 7.5 mg (ive received several requests from pharmacy to decrease- you can inform patient of this as well) - please make sure to read full message- there is a question about PA from pharmacist below- please discuss with them and see if that is needed.

## 2018-06-21 NOTE — Telephone Encounter (Signed)
Okay for refill? Last refill: 30 capsule 2 10/13/2017    Please advise

## 2018-06-22 ENCOUNTER — Other Ambulatory Visit: Payer: Self-pay

## 2018-06-22 DIAGNOSIS — G4700 Insomnia, unspecified: Secondary | ICD-10-CM

## 2018-06-22 MED ORDER — TEMAZEPAM 7.5 MG PO CAPS: 7.5000 mg | ORAL_CAPSULE | Freq: Every evening | ORAL | 0 refills | Status: DC | PRN

## 2018-06-22 NOTE — Telephone Encounter (Signed)
PA for temazepam 7.5 mg capsules approved through 03/22/2019.

## 2018-06-22 NOTE — Telephone Encounter (Signed)
Noted! Thanks for your help. ?

## 2018-06-22 NOTE — Telephone Encounter (Signed)
PA is currently in progress.  Waiting for insurance decision.

## 2018-06-22 NOTE — Telephone Encounter (Signed)
Lm informing pt of update

## 2018-06-27 ENCOUNTER — Other Ambulatory Visit: Payer: Self-pay | Admitting: Family Medicine

## 2018-06-27 DIAGNOSIS — G4700 Insomnia, unspecified: Secondary | ICD-10-CM

## 2018-06-28 NOTE — Telephone Encounter (Signed)
Medication looks like it was sent on 06/22/18.

## 2018-06-30 ENCOUNTER — Telehealth: Payer: Self-pay | Admitting: Family Medicine

## 2018-06-30 NOTE — Telephone Encounter (Signed)
Copied from [place] [id number]. Topic: Quick Communication - Rx Refill/Question >> Jun 30, 2018 10:52 AM Glenda [name] wrote: Medication: temazepam (RESTORIL) 7.5 MG capsule - PA was approved today but the price is basically the same as the cash price even with the PA. Reach out to son to see if another medication can be used that is less in cost . Pt monthly copay is $78/ please advise   Has the patient contacted their pharmacy? Yes [name] from [facility] called in  Preferred Pharmacy (with phone number or street name): [facility] [place] [phone] (Phone) [phone] (Fax)    Agent: Please be advised that RX refills may take up to 3 business days. We ask that you follow-up with your pharmacy.

## 2018-07-03 NOTE — Telephone Encounter (Signed)
Let me ask this question-was the 15 mg tablet expensive?  She was on this previously and did well-I tried to change the dose based off the pharmacy recommendation.

## 2018-07-03 NOTE — Telephone Encounter (Signed)
See note

## 2018-07-03 NOTE — Telephone Encounter (Signed)
Spoke to [name] and she stated that Rx is too expensive. I asked if a sub. Was given for the temazepam and she stated that trazadone was a sub. That was covered. [name] wanted to know if pt needed to be tapered off of Rx before starting a new one since it is a benzo.   Please advise   If no taper is needed Please send in trazadone

## 2018-07-04 NOTE — Telephone Encounter (Signed)
Called the pharmacy but they are not ope until 915 am. Will call back shortly.

## 2018-07-05 MED ORDER — TRAZODONE HCL 50 MG PO TABS: 50.0000 mg | ORAL_TABLET | Freq: Every evening | ORAL | 3 refills | Status: DC | PRN

## 2018-07-05 NOTE — Telephone Encounter (Signed)
Dr, Yong Channel, please send Rx for Trazadone was not sent do not know dosage. Thanks Butch Penny

## 2018-07-05 NOTE — Telephone Encounter (Signed)
I called and spoke to the pharmacist, [name]. She stated that the 7.5 mg.  Temazepam is not covered and the 15 mg is covered by the insurance. She also stated that when she tried to fill the trazadone that the system shows that the patient has an allergy. The pharmacist also stated that the patient has about 1 week worth of 7.5 mg temazepam left.   I then called the patient. She stated that she is unsure if she has an allergy to trazadone. She also said that she is not sleeping well with the 7.5mg  temazepam. I explained to her  that since she has some medication left that I would send this message to Dr. Yong Channel and that we would contact her tomorrow with next steps. She stated that she understood and appreciated the call back.

## 2018-07-05 NOTE — Telephone Encounter (Signed)
Called Pharmacy and spoke to [place] told her Dr. Yong Channel said there is no need to taper medication. Hassan Rowan verbalized understanding and said they do not have a Rx.

## 2018-07-05 NOTE — Telephone Encounter (Signed)
I sent this in- please inform patient about change. 2 days ago I had asked for information if 15 mg temazepam was cheaper than 7.5mg  but since that information was never obtained- im going to send in trazodone so she doesn't go without a sleep medication. If trazodone doesn't work- hopefully pharmacy can give info on 7.5 mg tablet- team please try to get this information (as you stated you would call back 2 days ago but was not completed)

## 2018-07-06 MED ORDER — TEMAZEPAM 15 MG PO CAPS: 15.0000 mg | ORAL_CAPSULE | Freq: Every day | ORAL | 5 refills | Status: DC

## 2018-07-06 NOTE — Telephone Encounter (Signed)
I spoke with pt and informed her Dr. Ansel Bong message.  Pt was thankful to have the 15mg  tab verses the 7.5mg 

## 2018-07-06 NOTE — Telephone Encounter (Signed)
I am just going to convert back to the 15 mg dose of temazepam-patient slept better on this and it was covered.  Please inform patient- I am sorry for any confusion or nights of poor sleep  We made attempt to change to temazepam 7.5 mg based on pharmacist recommendation but since this is not covered/expensive I do not think it is reasonable to try to continue this.  Also with possible trazodone allergy-want to avoid that-I did not formally add to allergy list since patient was not sure what her reaction was nor was the pharmacist.

## 2018-07-07 ENCOUNTER — Telehealth: Payer: Self-pay | Admitting: Family Medicine

## 2018-07-07 NOTE — Telephone Encounter (Signed)
Please advise 

## 2018-07-07 NOTE — Telephone Encounter (Signed)
Copied from [place] [id number]. Topic: Quick Communication - Rx Refill/Question >> Jul 07, 2018 10:14 AM Rayann Heman wrote: Medication: temazepam (RESTORIL) 15 MG capsule [[id number]]  pharmacy called and stated that she need conformation that patient needs to start 15 mg. Please advise

## 2018-07-07 NOTE — Telephone Encounter (Signed)
See Note

## 2018-07-10 NOTE — Telephone Encounter (Signed)
Called pharmacy and spoke to [place] and verified they received last Rx sent in by Dr. Yong Channel was for Temazepam 15 mg. [name] said yes. Told her okay please cancel Rx for Trazodone and fill Rx for Temazepam 15 mg per Dr. Ansel Bong orders. [name] verbalized understanding.

## 2018-07-10 NOTE — Telephone Encounter (Signed)
Yes thanks- go back to 15 mg dose- the 7.5 was too expensive and there was potential allergy on their chart listed to trazodone per past phone call

## 2018-07-17 ENCOUNTER — Telehealth: Payer: Self-pay | Admitting: Family Medicine

## 2018-07-17 MED ORDER — HYDROCODONE-ACETAMINOPHEN 5-325 MG PO TABS: 1.0000 | ORAL_TABLET | Freq: Two times a day (BID) | ORAL | 0 refills | Status: DC

## 2018-07-17 NOTE — Telephone Encounter (Signed)
Copied from [place] [id number]. Topic: Quick Communication - See Telephone Encounter >> Jul 17, 2018 12:30 PM Vernona Rieger wrote: CRM for notification. See Telephone encounter for: 07/17/18.  RX care called and said that back in February they received three scripts for her HYDROcodone-acetaminophen (NORCO/VICODIN) 5-325 MG tablet. They just noticed today that the third script has different directions. The first two said take 1 tablet by mouth daily and the third one said to every 6 hours as needed for pain. Please Advise.

## 2018-07-17 NOTE — Telephone Encounter (Signed)
I sent in new rx to reflect prior dosing

## 2018-07-17 NOTE — Telephone Encounter (Signed)
Please advise 

## 2018-07-25 ENCOUNTER — Other Ambulatory Visit: Payer: Self-pay | Admitting: Family Medicine

## 2018-08-08 ENCOUNTER — Ambulatory Visit (INDEPENDENT_AMBULATORY_CARE_PROVIDER_SITE_OTHER): Payer: Medicare Other | Admitting: Family Medicine

## 2018-08-08 ENCOUNTER — Encounter: Payer: Self-pay | Admitting: Family Medicine

## 2018-08-08 VITALS — BP 132/62 | HR 64 | Resp 12 | Ht 61.0 in | Wt 120.6 lb

## 2018-08-08 DIAGNOSIS — I5032 Chronic diastolic (congestive) heart failure: Secondary | ICD-10-CM | POA: Diagnosis not present

## 2018-08-08 DIAGNOSIS — I10 Essential (primary) hypertension: Secondary | ICD-10-CM | POA: Diagnosis not present

## 2018-08-08 DIAGNOSIS — M159 Polyosteoarthritis, unspecified: Secondary | ICD-10-CM | POA: Diagnosis not present

## 2018-08-08 DIAGNOSIS — Z170 Estrogen receptor positive status [ER+]: Secondary | ICD-10-CM

## 2018-08-08 DIAGNOSIS — I4892 Unspecified atrial flutter: Secondary | ICD-10-CM

## 2018-08-08 DIAGNOSIS — F419 Anxiety disorder, unspecified: Secondary | ICD-10-CM

## 2018-08-08 DIAGNOSIS — G894 Chronic pain syndrome: Secondary | ICD-10-CM

## 2018-08-08 DIAGNOSIS — F1190 Opioid use, unspecified, uncomplicated: Secondary | ICD-10-CM

## 2018-08-08 DIAGNOSIS — C50412 Malignant neoplasm of upper-outer quadrant of left female breast: Secondary | ICD-10-CM

## 2018-08-08 MED ORDER — HYDROCODONE-ACETAMINOPHEN 5-325 MG PO TABS: 1.0000 | ORAL_TABLET | Freq: Two times a day (BID) | ORAL | 0 refills | Status: DC

## 2018-08-08 NOTE — Patient Instructions (Addendum)
There are no preventive care reminders to display for this patient.  Depression screen [facility] 2/9 08/08/2018 05/18/2018 07/11/2017  Decreased Interest 0 0 0  Down, Depressed, Hopeless 0 0 0  PHQ - 2 Score 0 0 0  Altered sleeping 0 0 -  Tired, decreased energy 0 0 -  Change in appetite 0 0 -  Feeling bad or failure about yourself  0 0 -  Trouble concentrating 0 0 -  Moving slowly or fidgety/restless 0 0 -  Suicidal thoughts 0 0 -  PHQ-9 Score 0 0 -  Difficult doing work/chores - Not difficult at all -   Video visit

## 2018-08-08 NOTE — Progress Notes (Signed)
Phone [phone]   Subjective:  Virtual visit via phonenote Chief Complaint  Patient presents with  . Arthritis   This visit type was conducted due to national recommendations for restrictions regarding the COVID-19 Pandemic (e.g. social distancing).  This format is felt to be most appropriate for this patient at this time balancing risks to patient and risks to population by having him in for in person visit.  All issues noted in this document were discussed and addressed.  No physical exam was performed (except for noted visual exam or audio findings with Telehealth visits).  The patient has consented to conduct a Telehealth visit and understands insurance will be billed.   Our team/I connected with [name] at 10:40 AM EDT by phone (patient did not have equipment for webex) and verified that I am speaking with the correct person using two identifiers.  Location patient: [place] Location provider: [facility], office Persons participating in the virtual visit:  patient  Time on phone: 24 minutes Counseling provided about covid 19  Our team/I discussed the limitations of evaluation and management by telemedicine and the availability of in person appointments. In light of current covid-19 pandemic, patient also understands that we are trying to protect them by minimizing in office contact if at all possible.  The patient expressed consent for telemedicine visit and agreed to proceed. Patient understands insurance will be billed.   ROS- No chest pain or shortness of breath. No headache or blurry vision.    Past Medical History-  Patient Active Problem List   Diagnosis Date Noted  . Chronic pain syndrome 02/09/2018    Priority: High  . Chronic narcotic use 08/11/2017    Priority: High  . Malignant neoplasm of upper-outer quadrant of left breast in female, estrogen receptor positive ([place]) 06/27/2017    Priority: High  . Atrial flutter ([place])     Priority: High  . Chronic  diastolic HF (heart failure) ([place]) 07/11/2012    Priority: High  . Osteoarthritis 04/03/2012    Priority: High  . Abdominal hernia 05/19/2018    Priority: Medium  . Shortness of breath 12/13/2014    Priority: Medium  . Aortic stenosis, mild 07/11/2012    Priority: Medium  . Essential hypertension 03/25/2011    Priority: Medium  . Insomnia 09/15/2010    Priority: Medium  . Hypercholesterolemia 09/15/2010    Priority: Medium  . History of cardioversion 08/20/2016    Priority: Low  . Palpitations 12/13/2014    Priority: Low  . Constipation 11/06/2012    Priority: Low  . Accelerated/malignant hypertension 09/14/2012    Priority: Low  . Anemia 06/10/2011    Priority: Low    Medications- reviewed and updated Current Outpatient Medications  Medication Sig Dispense Refill  . amLODipine (NORVASC) 5 MG tablet Take 1 tablet (5 mg total) by mouth daily. 90 tablet 3  . apixaban (ELIQUIS) 2.5 MG TABS tablet Take 1 tablet (2.5 mg total) by mouth 2 (two) times daily. 60 tablet 4  . chlorhexidine (PERIDEX) 0.12 % solution Use as directed 5 mLs in the mouth or throat 2 (two) times daily.     . Cholecalciferol 1000 units tablet Take 1,000 Units by mouth daily.    . Cranberry 425 MG CAPS Take 425 mg by mouth 2 (two) times daily.    Marland Kitchen docusate sodium (COLACE) 100 MG capsule Take 100 mg by mouth 2 (two) times daily.    . furosemide (LASIX) 20 MG tablet Take 1.5 tablets (30 mg  total) by mouth daily. 45 tablet 3  . HYDROcodone-acetaminophen (NORCO/VICODIN) 5-325 MG tablet Take 1 tablet by mouth 2 (two) times daily. May refill 1 month. At 9 Am and 3 PM. Separate by 6 hours from temazepam. 60 tablet 0  . HYDROcodone-acetaminophen (NORCO/VICODIN) 5-325 MG tablet Take 1 tablet by mouth 2 (two) times daily. May refill today At 9 Am and 3 PM. Separate by 6 hours from temazepam. 60 tablet 0  . letrozole (FEMARA) 2.5 MG tablet Take 1 tablet (2.5 mg total) by mouth daily. 90 tablet 3  . losartan (COZAAR) 100  MG tablet Take 1 tablet (100 mg total) by mouth daily. 90 tablet 3  . Multiple Vitamins-Minerals (CEROVITE ADVANCED FORMULA PO) Take 1 tablet by mouth daily. (0800)    . omeprazole (PRILOSEC) 40 MG capsule Take 40 mg by mouth daily.    . ondansetron (ZOFRAN) 4 MG tablet Take 4 mg by mouth every 6 (six) hours as needed for nausea.    . polyethylene glycol (MIRALAX / GLYCOLAX) packet Take 17 g by mouth daily. Taken  Daily Monday to Saturday.    . sertraline (ZOLOFT) 25 MG tablet TAKE (1) TABLET BY MOUTH ONCE DAILY. 30 tablet 0  . temazepam (RESTORIL) 15 MG capsule Take 1 capsule (15 mg total) by mouth at bedtime. Should separate from hydrocodone by at least 6 hours. 30 capsule 5  . HYDROcodone-acetaminophen (NORCO/VICODIN) 5-325 MG tablet Take 1 tablet by mouth 2 (two) times a day. At 9 AM and 3 pm. separate by 6 hours from temazepam. May refill in 2 months. 60 tablet 0   No current facility-administered medications for this visit.      Objective:  BP 132/62   Pulse 64   Resp 12   Ht 5\' 1"  (1.549 m)   Wt 120 lb 9.6 oz (54.7 kg)   BMI 22.79 kg/m  self reported vitals  Nonlabored voice, normal speech     Assessment and Plan   #Hypertension S: Compliant with losartan 100 mg, amlodipine 5 mg (gets edema even on nonfull dose), Lasix 30 mg. Trial low dose zoloft for anxiety portion early 2020 - blood pressure actually has done better with this.  Home monitoring- with excellent control per nursing staff at [facility] - 110s up to 140s for most part.  A/P: Stable. Continue current medications.   - white coat HTN better on zoloft- anxiety likely was contributing   #Osteoarthritis/insomnia S: Patient with continued pain particularly in her low back.  Also some finger pain.  Pain worsens with walking-hydrocodone lessens pain some.  She seems to do okay at rest as long as hydrocodone is available.  She uses hydrocodone twice a day and spaces last dose from 6 hours from temazepam to decrease  fall risk.  Temazepam remains effective  for sleep issues/insomnia- we tried to change to 7.5mg  dose but was very expensive so have opted to continue 15mg  dose. Her facility has trazodone listed as allergy so we have opted out A/P: arthritis and insomnia are stable. No falls or over sedation reported. Continue current rx- refills for 3 months on pain medicines done today.   -Last UDS May 2019- due now but we will defer due to covid 19 - [place] reviewed- only rx through me since I became PCP -Controlled substance contract May 2019  #Chronic diastolic heart failure S: Compliant with Lasix 30 mg.  Hesitant to increase amlodipine due to heart failure.  Edema-reports stable.  Weight-stable  A/P: Stable. Continue current  medications.   #Atrial flutter S:Patient compliant with Eliquis.  Cardioversion 2017 and appears to stay in normal sinus rhythm. Does report some palpitations- HR has been normal. Patient also feels like palpitations are better on zoloft A/P: Stable. Continue current medications.    # Anxiety S:patient started on zoloft  25 mg in February 2020- she reports improvement in BP on this (she would get anxious before BP checks) A/P: Stable. Continue current medications.    #osteopenia- we discussed updating this when safe from covid 19   % Breast cancer-continues to follow with Dr. Sonny Dandy- she is compliant with letrozole/femara  Possibly do labs in 3 month visit at next visit for vicodin refill Future Appointments  Date Time Provider [place]  08/15/2018  1:45 PM Nicholas Lose, MD [facility] None   3Lab/Order associations: Malignant neoplasm of upper-outer quadrant of left breast in female, estrogen receptor positive ([place])  Essential hypertension  Chronic pain syndrome  Chronic narcotic use  Atrial flutter, unspecified type ([place])  Osteoarthritis of multiple joints, unspecified osteoarthritis type  Chronic diastolic HF (heart failure) ([place])  Meds ordered this  encounter  Medications  . HYDROcodone-acetaminophen (NORCO/VICODIN) 5-325 MG tablet    Sig: Take 1 tablet by mouth 2 (two) times daily. May refill 1 month. At 9 Am and 3 PM. Separate by 6 hours from temazepam.    Dispense:  60 tablet    Refill:  0  . HYDROcodone-acetaminophen (NORCO/VICODIN) 5-325 MG tablet    Sig: Take 1 tablet by mouth 2 (two) times daily. May refill today At 9 Am and 3 PM. Separate by 6 hours from temazepam.    Dispense:  60 tablet    Refill:  0  . HYDROcodone-acetaminophen (NORCO/VICODIN) 5-325 MG tablet    Sig: Take 1 tablet by mouth 2 (two) times a day. At 9 AM and 3 pm. separate by 6 hours from temazepam. May refill in 2 months.    Dispense:  60 tablet    Refill:  0   Return precautions advised.  Garret Reddish, MD

## 2018-08-15 ENCOUNTER — Ambulatory Visit: Payer: Medicare Other | Admitting: Hematology and Oncology

## 2018-08-29 ENCOUNTER — Other Ambulatory Visit: Payer: Self-pay | Admitting: Family Medicine

## 2018-09-15 ENCOUNTER — Other Ambulatory Visit: Payer: Self-pay

## 2018-09-15 MED ORDER — SERTRALINE HCL 25 MG PO TABS: ORAL_TABLET | ORAL | 3 refills | Status: DC

## 2018-10-04 ENCOUNTER — Telehealth: Payer: Self-pay | Admitting: Hematology and Oncology

## 2018-10-04 NOTE — Telephone Encounter (Signed)
Called pt per 7/15 sch message - no answe r- left message for patient to call back to reschedule appt.   

## 2018-10-09 NOTE — Progress Notes (Signed)
Patient Care Team: Marin Olp, MD as PCP - General (Family Medicine) Darlin Coco, MD (Cardiology) Garald Balding, MD (Orthopedic Surgery) Nicholas Lose, MD as Consulting Physician (Hematology and Oncology) Excell Seltzer, MD as Consulting Physician (General Surgery) Delice Bison, Charlestine Massed, NP as Nurse Practitioner (Hematology and Oncology)  DIAGNOSIS:    ICD-10-CM   1. Malignant neoplasm of upper-outer quadrant of left breast in female, estrogen receptor positive ([place])  C50.412    Z17.0     SUMMARY OF ONCOLOGIC HISTORY: Oncology History  Malignant neoplasm of upper-outer quadrant of left breast in female, estrogen receptor positive ([place])  06/07/2017 Initial Diagnosis   Suspicious mass left breast 1 o'clock position at the site of the palpable lump 2.3 x 1.2 x 3.8 cm, 1 cm away is some mild satellite lesions 7 x 5 x 5 mm, no axillary lymph nodes biopsy revealed IDC with DCIS grade 3.  ER 100%, PR 100%, Ki-67 70%, HER-2 positive ratio 2.03, gene copy #9.25, T2 N0 stage Ib   07/22/2017 Surgery   Left mastectomy: Grade 3 multifocal IDC with DCIS, 3.9 cm and 0.8 cm, margins negative, ER 100%, PR 100%, HER-2 positive ratio 2.03, T2 N0 stage Ia    07/2017 -  Anti-estrogen oral therapy   Letrozole daily     CHIEF COMPLIANT: Follow-up of left breast cancer on letrozole  INTERVAL HISTORY: Glenda [name] is a 85 y.o. with above-mentioned history of left breast cancer treated with left mastectomy and who is currently on anti-estrogen therapy with letrozole. She presents to the clinic today for 8 month follow-up.  Her major complaint is hair loss.  REVIEW OF SYSTEMS:   Constitutional: Denies fevers, chills or abnormal weight loss Eyes: Denies blurriness of vision Ears, nose, mouth, throat, and face: Denies mucositis or sore throat Respiratory: Denies cough, dyspnea or wheezes Cardiovascular: Denies palpitation, chest discomfort Gastrointestinal: Denies nausea,  heartburn or change in bowel habits Skin: Denies abnormal skin rashes Lymphatics: Denies new lymphadenopathy or easy bruising Neurological: Denies numbness, tingling or new weaknesses Behavioral/Psych: Mood is stable, no new changes  Extremities: No lower extremity edema Breast: denies any pain or lumps or nodules in either breasts All other systems were reviewed with the patient and are negative.  I have reviewed the past medical history, past surgical history, social history and family history with the patient and they are unchanged from previous note.  ALLERGIES:  is allergic to ace inhibitors; halcion [triazolam]; pentazocine lactate; sulfa drugs cross reactors; amitriptyline; avelox [moxifloxacin hcl in nacl]; and penicillins.  MEDICATIONS:  Current Outpatient Medications  Medication Sig Dispense Refill  . amLODipine (NORVASC) 5 MG tablet Take 1 tablet (5 mg total) by mouth daily. 90 tablet 3  . anastrozole (ARIMIDEX) 1 MG tablet Take 1 tablet (1 mg total) by mouth daily. 90 tablet 3  . apixaban (ELIQUIS) 2.5 MG TABS tablet Take 1 tablet (2.5 mg total) by mouth 2 (two) times daily. 60 tablet 4  . chlorhexidine (PERIDEX) 0.12 % solution Use as directed 5 mLs in the mouth or throat 2 (two) times daily.     . Cholecalciferol 1000 units tablet Take 1,000 Units by mouth daily.    . Cranberry 425 MG CAPS Take 425 mg by mouth 2 (two) times daily.    Marland Kitchen docusate sodium (COLACE) 100 MG capsule Take 100 mg by mouth 2 (two) times daily.    . furosemide (LASIX) 20 MG tablet Take 1.5 tablets (30 mg total) by mouth daily. 45 tablet 3  .  HYDROcodone-acetaminophen (NORCO/VICODIN) 5-325 MG tablet Take 1 tablet by mouth 2 (two) times daily. May refill 1 month. At 9 Am and 3 PM. Separate by 6 hours from temazepam. 60 tablet 0  . HYDROcodone-acetaminophen (NORCO/VICODIN) 5-325 MG tablet Take 1 tablet by mouth 2 (two) times daily. May refill today At 9 Am and 3 PM. Separate by 6 hours from temazepam. 60  tablet 0  . HYDROcodone-acetaminophen (NORCO/VICODIN) 5-325 MG tablet Take 1 tablet by mouth 2 (two) times a day. At 9 AM and 3 pm. separate by 6 hours from temazepam. May refill in 2 months. 60 tablet 0  . losartan (COZAAR) 100 MG tablet Take 1 tablet (100 mg total) by mouth daily. 90 tablet 3  . Multiple Vitamins-Minerals (CEROVITE ADVANCED FORMULA PO) Take 1 tablet by mouth daily. (0800)    . omeprazole (PRILOSEC) 40 MG capsule Take 40 mg by mouth daily.    . ondansetron (ZOFRAN) 4 MG tablet Take 4 mg by mouth every 6 (six) hours as needed for nausea.    . polyethylene glycol (MIRALAX / GLYCOLAX) packet Take 17 g by mouth daily. Taken  Daily Monday to Saturday.    . sertraline (ZOLOFT) 25 MG tablet TAKE (1) TABLET BY MOUTH ONCE DAILY. 30 tablet 3  . temazepam (RESTORIL) 15 MG capsule Take 1 capsule (15 mg total) by mouth at bedtime. Should separate from hydrocodone by at least 6 hours. 30 capsule 5   No current facility-administered medications for this visit.     PHYSICAL EXAMINATION: ECOG PERFORMANCE STATUS: 1 - Symptomatic but completely ambulatory  Vitals:   10/10/18 1100  BP: (!) 153/58  Pulse: 60  Resp: 18  Temp: 98.7 F (37.1 C)  SpO2: 96%   Filed Weights   10/10/18 1100  Weight: 120 lb 9.6 oz (54.7 kg)    GENERAL: alert, no distress and comfortable SKIN: skin color, texture, turgor are normal, no rashes or significant lesions EYES: normal, Conjunctiva are pink and non-injected, sclera clear OROPHARYNX: no exudate, no erythema and lips, buccal mucosa, and tongue normal  NECK: supple, thyroid normal size, non-tender, without nodularity LYMPH: no palpable lymphadenopathy in the cervical, axillary or inguinal LUNGS: clear to auscultation and percussion with normal breathing effort HEART: regular rate & rhythm and no murmurs and no lower extremity edema ABDOMEN: abdomen soft, non-tender and normal bowel sounds MUSCULOSKELETAL: no cyanosis of digits and no clubbing   NEURO: alert & oriented x 3 with fluent speech, no focal motor/sensory deficits EXTREMITIES: No lower extremity edema BREAST: No palpable masses or nodules in either right or left breasts. No palpable axillary supraclavicular or infraclavicular adenopathy no breast tenderness or nipple discharge. (exam performed in the presence of a chaperone)  LABORATORY DATA:  I have reviewed the data as listed CMP Latest Ref Rng & Units 03/20/2018 07/23/2017 07/12/2017  Glucose 65 - 99 mg/dL 89 158(H) 88  BUN 10 - 36 mg/dL 27 17 24(H)  Creatinine 0.57 - 1.00 mg/dL 0.88 0.78 1.00  Sodium 134 - 144 mmol/L 137 134(L) 135  Potassium 3.5 - 5.2 mmol/L 4.3 4.6 4.0  Chloride 96 - 106 mmol/L 97 99(L) 100(L)  CO2 20 - 29 mmol/L '24 25 23  '$ Calcium 8.7 - 10.3 mg/dL 9.6 8.7(L) 9.0  Total Protein 6.0 - 8.5 g/dL 7.1 - -  Total Bilirubin 0.0 - 1.2 mg/dL 0.5 - -  Alkaline Phos 39 - 117 IU/L 89 - -  AST 0 - 40 IU/L 19 - -  ALT 0 - 32  IU/L 9 - -    Lab Results  Component Value Date   WBC 5.7 03/20/2018   HGB 12.7 03/20/2018   HCT 37.6 03/20/2018   MCV 88 03/20/2018   PLT 243 03/20/2018   NEUTROABS 3.5 08/19/2016    ASSESSMENT & PLAN:  Malignant neoplasm of upper-outer quadrant of left breast in female, estrogen receptor positive ([place]) 06/07/2017 suspicious mass left breast 1 o'clock position at the site of the palpable lump 2.3 x 1.2 x 3.8 cm, 1 cm away is some mild satellite lesions 7 x 5 x 5 mm, no axillary lymph nodes biopsy revealed IDC with DCIS grade 3. ER 100%, PR 100%, Ki-67 70%, HER-2 positive ratio 2.03, gene copy #9.25, T2 N0 stage Ib 07/22/2017:Left mastectomy: Grade 3 multifocal IDC with DCIS, 3.9 cm and 0.8 cm, margins negative, ER 100%, PR 100%, HER-2 positive ratio 2.03, T2 N0 stage Ia  (Even though patient is HER-2 positive, given her advanced age, I did not recommend anti-HER-2 therapy.)  Current treatment: Letrozole 2.5 mg daily started 07/29/2017 discontinued 10/10/2018 for hair loss.  Switched  to anastrozole 1 mg daily  Breast cancer surveillance: She will set up an appointment for mammogram.  Return to clinic in 1 year for follow-up  No orders of the defined types were placed in this encounter.  The patient has a good understanding of the overall plan. she agrees with it. she will call with any problems that may develop before the next visit here.  Nicholas Lose, MD 10/10/2018  Julious Oka Dorshimer am acting as scribe for Dr. Nicholas Lose.  I have reviewed the above documentation for accuracy and completeness, and I agree with the above.

## 2018-10-10 ENCOUNTER — Telehealth: Payer: Self-pay | Admitting: Hematology and Oncology

## 2018-10-10 ENCOUNTER — Inpatient Hospital Stay: Payer: Medicare Other | Attending: Hematology and Oncology | Admitting: Hematology and Oncology

## 2018-10-10 ENCOUNTER — Other Ambulatory Visit: Payer: Self-pay

## 2018-10-10 DIAGNOSIS — C50412 Malignant neoplasm of upper-outer quadrant of left female breast: Secondary | ICD-10-CM | POA: Insufficient documentation

## 2018-10-10 DIAGNOSIS — Z170 Estrogen receptor positive status [ER+]: Secondary | ICD-10-CM | POA: Insufficient documentation

## 2018-10-10 DIAGNOSIS — Z79811 Long term (current) use of aromatase inhibitors: Secondary | ICD-10-CM | POA: Insufficient documentation

## 2018-10-10 MED ORDER — ANASTROZOLE 1 MG PO TABS: 1.0000 mg | ORAL_TABLET | Freq: Every day | ORAL | 3 refills | Status: DC

## 2018-10-10 NOTE — Assessment & Plan Note (Signed)
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 ER 100%, PR 100%, Ki-67 70%, HER-2 positive ratio 2.03, gene copy #9.25, T2 N0 stage Ib 07/22/2017:Left mastectomy: Grade 3 multifocal IDC with DCIS, 3.9 cm and 0.8 cm, margins negative, ER 100%, PR 100%, HER-2 positive ratio 2.03, T2 N0 stage Ia  (Even though patient is HER-2 positive, given her advanced age, I did not recommend anti-HER-2 therapy.)  Current treatment: Letrozole 2.5 mg daily started 07/29/2017  Letrozole toxicities: Denies any hot flashes or arthralgias or myalgias Breast cancer surveillance:  Return to clinic in 1 year for follow-up

## 2018-10-10 NOTE — Telephone Encounter (Signed)
I talk with patient regarding schedule  

## 2018-10-18 ENCOUNTER — Inpatient Hospital Stay (HOSPITAL_COMMUNITY)
Admission: EM | Admit: 2018-10-18 | Discharge: 2018-10-24 | DRG: 481 | Disposition: A | Discharge status: Skilled Nursing Facility | Payer: Medicare Other | Attending: Internal Medicine | Admitting: Internal Medicine

## 2018-10-18 ENCOUNTER — Other Ambulatory Visit: Payer: Self-pay

## 2018-10-18 ENCOUNTER — Emergency Department (HOSPITAL_COMMUNITY): Payer: Medicare Other

## 2018-10-18 ENCOUNTER — Encounter (HOSPITAL_COMMUNITY): Payer: Self-pay | Admitting: Student

## 2018-10-18 DIAGNOSIS — Z170 Estrogen receptor positive status [ER+]: Secondary | ICD-10-CM

## 2018-10-18 DIAGNOSIS — Y92003 Bedroom of unspecified non-institutional (private) residence as the place of occurrence of the external cause: Secondary | ICD-10-CM | POA: Diagnosis not present

## 2018-10-18 DIAGNOSIS — C50412 Malignant neoplasm of upper-outer quadrant of left female breast: Secondary | ICD-10-CM | POA: Diagnosis present

## 2018-10-18 DIAGNOSIS — Z96651 Presence of right artificial knee joint: Secondary | ICD-10-CM | POA: Diagnosis present

## 2018-10-18 DIAGNOSIS — Z9841 Cataract extraction status, right eye: Secondary | ICD-10-CM

## 2018-10-18 DIAGNOSIS — I5032 Chronic diastolic (congestive) heart failure: Secondary | ICD-10-CM | POA: Diagnosis present

## 2018-10-18 DIAGNOSIS — Z79899 Other long term (current) drug therapy: Secondary | ICD-10-CM | POA: Diagnosis not present

## 2018-10-18 DIAGNOSIS — M5136 Other intervertebral disc degeneration, lumbar region: Secondary | ICD-10-CM | POA: Diagnosis present

## 2018-10-18 DIAGNOSIS — Z9049 Acquired absence of other specified parts of digestive tract: Secondary | ICD-10-CM

## 2018-10-18 DIAGNOSIS — I350 Nonrheumatic aortic (valve) stenosis: Secondary | ICD-10-CM | POA: Diagnosis present

## 2018-10-18 DIAGNOSIS — F0390 Unspecified dementia without behavioral disturbance: Secondary | ICD-10-CM | POA: Diagnosis present

## 2018-10-18 DIAGNOSIS — Z66 Do not resuscitate: Secondary | ICD-10-CM | POA: Diagnosis present

## 2018-10-18 DIAGNOSIS — Z79811 Long term (current) use of aromatase inhibitors: Secondary | ICD-10-CM | POA: Diagnosis not present

## 2018-10-18 DIAGNOSIS — K219 Gastro-esophageal reflux disease without esophagitis: Secondary | ICD-10-CM | POA: Diagnosis present

## 2018-10-18 DIAGNOSIS — W1830XA Fall on same level, unspecified, initial encounter: Secondary | ICD-10-CM | POA: Diagnosis present

## 2018-10-18 DIAGNOSIS — I110 Hypertensive heart disease with heart failure: Secondary | ICD-10-CM | POA: Diagnosis present

## 2018-10-18 DIAGNOSIS — M545 Low back pain: Secondary | ICD-10-CM | POA: Diagnosis present

## 2018-10-18 DIAGNOSIS — I4892 Unspecified atrial flutter: Secondary | ICD-10-CM | POA: Diagnosis present

## 2018-10-18 DIAGNOSIS — S72001A Fracture of unspecified part of neck of right femur, initial encounter for closed fracture: Secondary | ICD-10-CM

## 2018-10-18 DIAGNOSIS — K5909 Other constipation: Secondary | ICD-10-CM | POA: Diagnosis present

## 2018-10-18 DIAGNOSIS — Z9012 Acquired absence of left breast and nipple: Secondary | ICD-10-CM

## 2018-10-18 DIAGNOSIS — I4891 Unspecified atrial fibrillation: Secondary | ICD-10-CM | POA: Diagnosis present

## 2018-10-18 DIAGNOSIS — F329 Major depressive disorder, single episode, unspecified: Secondary | ICD-10-CM | POA: Diagnosis present

## 2018-10-18 DIAGNOSIS — G894 Chronic pain syndrome: Secondary | ICD-10-CM | POA: Diagnosis present

## 2018-10-18 DIAGNOSIS — F1190 Opioid use, unspecified, uncomplicated: Secondary | ICD-10-CM | POA: Diagnosis present

## 2018-10-18 DIAGNOSIS — S72011A Unspecified intracapsular fracture of right femur, initial encounter for closed fracture: Secondary | ICD-10-CM | POA: Diagnosis present

## 2018-10-18 DIAGNOSIS — Z79891 Long term (current) use of opiate analgesic: Secondary | ICD-10-CM | POA: Diagnosis not present

## 2018-10-18 DIAGNOSIS — Z20828 Contact with and (suspected) exposure to other viral communicable diseases: Secondary | ICD-10-CM | POA: Diagnosis present

## 2018-10-18 DIAGNOSIS — K5790 Diverticulosis of intestine, part unspecified, without perforation or abscess without bleeding: Secondary | ICD-10-CM | POA: Diagnosis present

## 2018-10-18 DIAGNOSIS — Z961 Presence of intraocular lens: Secondary | ICD-10-CM | POA: Diagnosis present

## 2018-10-18 DIAGNOSIS — Z880 Allergy status to penicillin: Secondary | ICD-10-CM

## 2018-10-18 DIAGNOSIS — Z888 Allergy status to other drugs, medicaments and biological substances status: Secondary | ICD-10-CM

## 2018-10-18 DIAGNOSIS — E7800 Pure hypercholesterolemia, unspecified: Secondary | ICD-10-CM | POA: Diagnosis present

## 2018-10-18 DIAGNOSIS — Z419 Encounter for procedure for purposes other than remedying health state, unspecified: Secondary | ICD-10-CM

## 2018-10-18 DIAGNOSIS — W19XXXA Unspecified fall, initial encounter: Secondary | ICD-10-CM

## 2018-10-18 DIAGNOSIS — Z7901 Long term (current) use of anticoagulants: Secondary | ICD-10-CM | POA: Diagnosis not present

## 2018-10-18 DIAGNOSIS — I10 Essential (primary) hypertension: Secondary | ICD-10-CM | POA: Diagnosis present

## 2018-10-18 DIAGNOSIS — Z9842 Cataract extraction status, left eye: Secondary | ICD-10-CM

## 2018-10-18 DIAGNOSIS — Z01810 Encounter for preprocedural cardiovascular examination: Secondary | ICD-10-CM | POA: Diagnosis not present

## 2018-10-18 DIAGNOSIS — Z882 Allergy status to sulfonamides status: Secondary | ICD-10-CM

## 2018-10-18 DIAGNOSIS — E871 Hypo-osmolality and hyponatremia: Secondary | ICD-10-CM | POA: Diagnosis present

## 2018-10-18 DIAGNOSIS — Z8249 Family history of ischemic heart disease and other diseases of the circulatory system: Secondary | ICD-10-CM

## 2018-10-18 DIAGNOSIS — S72001D Fracture of unspecified part of neck of right femur, subsequent encounter for closed fracture with routine healing: Secondary | ICD-10-CM | POA: Diagnosis not present

## 2018-10-18 DIAGNOSIS — S72009A Fracture of unspecified part of neck of unspecified femur, initial encounter for closed fracture: Secondary | ICD-10-CM | POA: Diagnosis present

## 2018-10-18 DIAGNOSIS — S7291XA Unspecified fracture of right femur, initial encounter for closed fracture: Secondary | ICD-10-CM

## 2018-10-18 HISTORY — DX: Unspecified fracture of right femur, initial encounter for closed fracture: S72.91XA

## 2018-10-18 LAB — BASIC METABOLIC PANEL
Anion gap: 12 (ref 5–15)
BUN: 26 mg/dL — ABNORMAL HIGH (ref 8–23)
CO2: 26 mmol/L (ref 22–32)
Calcium: 9 mg/dL (ref 8.9–10.3)
Chloride: 96 mmol/L — ABNORMAL LOW (ref 98–111)
Creatinine, Ser: 0.73 mg/dL (ref 0.44–1.00)
GFR calc Af Amer: >60 mL/min (ref >60)
GFR calc non Af Amer: >60 mL/min (ref >60)
Glucose, Bld: 123 mg/dL — ABNORMAL HIGH (ref 70–99)
Potassium: 3.8 mmol/L (ref 3.5–5.1)
Sodium: 134 mmol/L — ABNORMAL LOW (ref 135–145)

## 2018-10-18 LAB — CBC
HCT: 42.1 % (ref 36.0–46.0)
Hemoglobin: 13.8 g/dL (ref 12.0–15.0)
MCH: 29.8 pg (ref 26.0–34.0)
MCHC: 32.8 g/dL (ref 30.0–36.0)
MCV: 90.9 fL (ref 80.0–100.0)
Platelets: 189 10*3/uL (ref 150–400)
RBC: 4.63 MIL/uL (ref 3.87–5.11)
RDW: 13.2 % (ref 11.5–15.5)
WBC: 9.4 10*3/uL (ref 4.0–10.5)
nRBC: 0 % (ref 0.0–0.2)

## 2018-10-18 LAB — TYPE AND SCREEN
ABO/RH(D): O NEG
Antibody Screen: NEGATIVE

## 2018-10-18 LAB — SARS CORONAVIRUS 2 BY RT PCR (HOSPITAL ORDER, PERFORMED IN ~~LOC~~ HOSPITAL LAB): SARS Coronavirus 2: NEGATIVE

## 2018-10-18 LAB — PROTIME-INR
INR: 1.1 (ref 0.8–1.2)
Prothrombin Time: 13.9 seconds (ref 11.4–15.2)

## 2018-10-18 MED ORDER — POLYETHYLENE GLYCOL 3350 17 G PO PACK
17.0000 g | PACK | Freq: Every day | ORAL | Status: DC
  Administered 2018-10-18 – 2018-10-24 (×7): 17 g via ORAL
  Filled 2018-10-18 (×7): qty 1

## 2018-10-18 MED ORDER — PANTOPRAZOLE SODIUM 40 MG PO TBEC
40.0000 mg | DELAYED_RELEASE_TABLET | Freq: Every day | ORAL | Status: DC
  Administered 2018-10-18 – 2018-10-24 (×7): 40 mg via ORAL
  Filled 2018-10-18 (×7): qty 1

## 2018-10-18 MED ORDER — HYDROCODONE-ACETAMINOPHEN 5-325 MG PO TABS
1.0000 | ORAL_TABLET | Freq: Four times a day (QID) | ORAL | Status: DC | PRN
  Administered 2018-10-18 – 2018-10-23 (×12): 1 via ORAL
  Administered 2018-10-23: 2 via ORAL
  Administered 2018-10-23 (×2): 1 via ORAL
  Administered 2018-10-24: 2 via ORAL
  Administered 2018-10-24: 1 via ORAL
  Filled 2018-10-18: qty 1
  Filled 2018-10-18: qty 2
  Filled 2018-10-18 (×12): qty 1
  Filled 2018-10-18: qty 2
  Filled 2018-10-18 (×2): qty 1

## 2018-10-18 MED ORDER — BENZONATATE 100 MG PO CAPS: 100.0000 mg | ORAL_CAPSULE | Freq: Three times a day (TID) | ORAL | Status: DC | PRN

## 2018-10-18 MED ORDER — CHLORHEXIDINE GLUCONATE 4 % EX LIQD
60.0000 mL | Freq: Once | CUTANEOUS | Status: AC
  Administered 2018-10-19: 4 via TOPICAL
  Filled 2018-10-18: qty 60

## 2018-10-18 MED ORDER — ONDANSETRON HCL 4 MG PO TABS
4.0000 mg | ORAL_TABLET | Freq: Four times a day (QID) | ORAL | Status: DC | PRN
  Administered 2018-10-22: 4 mg via ORAL
  Filled 2018-10-18: qty 1

## 2018-10-18 MED ORDER — MORPHINE SULFATE (PF) 4 MG/ML IV SOLN
4.0000 mg | Freq: Once | INTRAVENOUS | Status: AC
  Administered 2018-10-18: 4 mg via INTRAVENOUS
  Filled 2018-10-18: qty 1

## 2018-10-18 MED ORDER — ANASTROZOLE 1 MG PO TABS
1.0000 mg | ORAL_TABLET | Freq: Every day | ORAL | Status: DC
  Administered 2018-10-19 – 2018-10-24 (×6): 1 mg via ORAL
  Filled 2018-10-18 (×8): qty 1

## 2018-10-18 MED ORDER — CLINDAMYCIN PHOSPHATE 900 MG/50ML IV SOLN
900.0000 mg | INTRAVENOUS | Status: DC
  Filled 2018-10-18 (×2): qty 50

## 2018-10-18 MED ORDER — DOCUSATE SODIUM 100 MG PO CAPS
100.0000 mg | ORAL_CAPSULE | Freq: Two times a day (BID) | ORAL | Status: DC
  Administered 2018-10-19 – 2018-10-24 (×11): 100 mg via ORAL
  Filled 2018-10-18 (×11): qty 1

## 2018-10-18 MED ORDER — POVIDONE-IODINE 10 % EX SWAB: 2.0000 "application " | Freq: Once | CUTANEOUS | Status: DC

## 2018-10-18 MED ORDER — VITAMIN D 25 MCG (1000 UNIT) PO TABS
1000.0000 [IU] | ORAL_TABLET | Freq: Every day | ORAL | Status: DC
  Administered 2018-10-19 – 2018-10-24 (×7): 1000 [IU] via ORAL
  Filled 2018-10-18 (×7): qty 1

## 2018-10-18 MED ORDER — MORPHINE SULFATE (PF) 2 MG/ML IV SOLN: 0.5000 mg | INTRAVENOUS | Status: DC | PRN

## 2018-10-18 MED ORDER — AMLODIPINE BESYLATE 5 MG PO TABS
5.0000 mg | ORAL_TABLET | Freq: Every day | ORAL | Status: DC
  Administered 2018-10-18 – 2018-10-24 (×6): 5 mg via ORAL
  Filled 2018-10-18 (×7): qty 1

## 2018-10-18 MED ORDER — ONDANSETRON HCL 4 MG/2ML IJ SOLN
4.0000 mg | Freq: Once | INTRAMUSCULAR | Status: AC
  Administered 2018-10-18: 14:00:00 4 mg via INTRAVENOUS
  Filled 2018-10-18: qty 2

## 2018-10-18 MED ORDER — SERTRALINE HCL 25 MG PO TABS
25.0000 mg | ORAL_TABLET | Freq: Every day | ORAL | Status: DC
  Administered 2018-10-18 – 2018-10-24 (×7): 25 mg via ORAL
  Filled 2018-10-18 (×7): qty 1

## 2018-10-18 MED ORDER — TEMAZEPAM 7.5 MG PO CAPS
15.0000 mg | ORAL_CAPSULE | Freq: Every day | ORAL | Status: DC
  Administered 2018-10-19 – 2018-10-23 (×6): 15 mg via ORAL
  Filled 2018-10-18 (×6): qty 2

## 2018-10-18 MED ORDER — APIXABAN 2.5 MG PO TABS
2.5000 mg | ORAL_TABLET | Freq: Two times a day (BID) | ORAL | Status: DC
  Administered 2018-10-19 – 2018-10-24 (×11): 2.5 mg via ORAL
  Filled 2018-10-18 (×11): qty 1

## 2018-10-18 MED ORDER — FUROSEMIDE 20 MG PO TABS
30.0000 mg | ORAL_TABLET | Freq: Every day | ORAL | Status: DC
  Administered 2018-10-19 – 2018-10-24 (×5): 30 mg via ORAL
  Filled 2018-10-18 (×6): qty 2

## 2018-10-18 NOTE — Progress Notes (Signed)
ED TO INPATIENT HANDOFF REPORT  Name/Age/Gender Glenda [name] 85 y.o. female  Code Status    Code Status Orders  (From admission, onward)         Start     Ordered   10/18/18 1807  Do not attempt resuscitation (DNR)  Continuous    Question Answer Comment  In the event of cardiac or respiratory ARREST Do not call a "code blue"   In the event of cardiac or respiratory ARREST Do not perform Intubation, CPR, defibrillation or ACLS   In the event of cardiac or respiratory ARREST Use medication by any route, position, wound care, and other measures to relive pain and suffering. May use oxygen, suction and manual treatment of airway obstruction as needed for comfort.      10/18/18 1806        Code Status History    Date Active Date Inactive Code Status Order ID Comments User Context   07/22/2017 1653 07/23/2017 1817 DNR [id number]  Excell Seltzer, MD Inpatient   07/22/2017 1143 07/22/2017 1653 Partial Code [id number]  Lytle Butte, RN Inpatient   08/20/2016 1257 08/20/2016 1915 Full Code [id number]  Evans Lance, MD Inpatient   08/19/2016 1556 08/20/2016 1257 Full Code [id number]  Isaiah Serge, NP Inpatient   10/06/2015 1848 10/11/2015 2059 DNR [id number]  Rondel Jumbo, PA-C Inpatient   10/06/2015 1715 10/06/2015 1848 Full Code [id number]  Rondel Jumbo, PA-C Inpatient   09/11/2012 2037 09/15/2012 1602 Full Code [id number]  Kristeen Miss, MD Inpatient   Advance Care Planning Activity    Advance Directive Documentation     Most Recent Value  Type of Advance Directive  Out of facility DNR (pink MOST or yellow form)  Pre-existing out of facility DNR order (yellow form or pink MOST form)  Yellow form placed in chart (order not valid for inpatient use)  "MOST" Form in Place?  -      Home/SNF/Other Assisted living  Chief Complaint fall hip pain  Level of Care/Admitting Diagnosis ED Disposition    ED Disposition Condition [place]: [facility]  [100100]  Level of Care: Telemetry Surgical [105]  Covid Evaluation: Asymptomatic Screening Protocol (No Symptoms)  Diagnosis: Hip fracture [facility]) [[id number]]  Admitting Physician: Elmarie Shiley [phone]  Attending Physician: Elmarie Shiley [phone]  Estimated length of stay: 3 - 4 days  Certification:: I certify this patient will need inpatient services for at least 2 midnights  PT Class (Do Not Modify): Inpatient [101]  PT Acc Code (Do Not Modify): Private [1]       Medical History Past Medical History:  Diagnosis Date  . Aortic stenosis    Echo 02/2011 showing mild AS with normal LV systolic function  . Arthritis    "lower back" (10/09/2015)  . Atrial flutter with rapid ventricular response ([place]) 08/19/2016   s/p successful DCCV on 08/20/16, continue eliquis  . Chronic bronchitis ([place])    "off and on; several years" (10/09/2015)  . Chronic diastolic CHF (congestive heart failure) ([place])    a. 12/2015 Echo: EF 55-60%, mild AI/MR, mildly dil LA.  Marland Kitchen Chronic lower back pain   . Complication of anesthesia    "they had trouble waking me up after colon resection"  . Confusion    Originally listed as TIA-pt denies this hx on 10/09/2015 "it was the [place] I was taking; they had thought I was having a st originally roke"  . DDD (degenerative  disc disease), lumbar    severe facet dz and adv DDD MRI L spine 2009  . Diverticulosis   . Femur fracture, right ([place]) 10/18/2018  . GERD (gastroesophageal reflux disease)   . Hiatal hernia    hx  . Hypercholesterolemia   . Hypertension   . Insomnia   . OA (osteoarthritis) of knee   . Paroxysmal atrial flutter ([place])    a. 09/2015 s/p TEE/DCCV;  b. CHA2DS2VASc = 5-->Eliquis.  . Peripheral edema   . PVCs (premature ventricular contractions)   . Sacral fracture ([place]) 11/06/2012  . Vertigo 11/19/2012    Allergies Allergies  Allergen Reactions  . Ace Inhibitors Other (See Comments)    Cough  . Halcion [Triazolam]     UNSPECIFIED REACTION   .  Pentazocine Lactate     UNSPECIFIED REACTION   . Sulfa Drugs Cross Reactors     UNSPECIFIED REACTION   . Trazodone And Nefazodone Other (See Comments)    Per MAR  . Amitriptyline Other (See Comments)    Hyperactivity  . Avelox [Moxifloxacin Hcl In Nacl] Other (See Comments)    dizziness  . Penicillins Rash    IV Location/Drains/Wounds Patient Lines/Drains/Airways Status   Active Line/Drains/Airways    Name:   Placement date:   Placement time:   Site:   Days:   Peripheral IV 10/18/18 Right;Lateral;Proximal Forearm   10/18/18    1059    Forearm   less than 1          Labs/Imaging Results for orders placed or performed during the hospital encounter of 10/18/18 (from the past 48 hour(s))  CBC     Status: None   Collection Time: 10/18/18 11:02 AM  Result Value Ref Range   WBC 9.4 4.0 - 10.5 K/uL   RBC 4.63 3.87 - 5.11 MIL/uL   Hemoglobin 13.8 12.0 - 15.0 g/dL   HCT 42.1 36.0 - 46.0 %   MCV 90.9 80.0 - 100.0 fL   MCH 29.8 26.0 - 34.0 pg   MCHC 32.8 30.0 - 36.0 g/dL   RDW 13.2 11.5 - 15.5 %   Platelets 189 150 - 400 K/uL   nRBC 0.0 0.0 - 0.2 %    Comment: Performed at [facility], [place] [address], [place], [place] [address]  Basic metabolic panel     Status: Abnormal   Collection Time: 10/18/18 11:02 AM  Result Value Ref Range   Sodium 134 (L) 135 - 145 mmol/L   Potassium 3.8 3.5 - 5.1 mmol/L   Chloride 96 (L) 98 - 111 mmol/L   CO2 26 22 - 32 mmol/L   Glucose, Bld 123 (H) 70 - 99 mg/dL   BUN 26 (H) 8 - 23 mg/dL   Creatinine, Ser 0.73 0.44 - 1.00 mg/dL   Calcium 9.0 8.9 - 10.3 mg/dL   GFR calc non Af Amer >60 >60 mL/min   GFR calc Af Amer >60 >60 mL/min   Anion gap 12 5 - 15    Comment: Performed at [facility], [place] [address] [place], [place] [address]  SARS Coronavirus 2 (CEPHEID - Performed in [place] hospital lab), Hosp Order     Status: None   Collection Time: 10/18/18 12:27 PM   Specimen: Nasopharyngeal Swab  Result  Value Ref Range   SARS Coronavirus 2 NEGATIVE NEGATIVE    Comment: (NOTE) If result is NEGATIVE SARS-CoV-2 target nucleic acids are NOT DETECTED. The SARS-CoV-2 RNA is generally detectable in upper and  lower  respiratory specimens during the acute phase of infection. The lowest  concentration of SARS-CoV-2 viral copies this assay can detect is 250  copies / mL. A negative result does not preclude SARS-CoV-2 infection  and should not be used as the sole basis for treatment or other  patient management decisions.  A negative result may occur with  improper specimen collection / handling, submission of specimen other  than nasopharyngeal swab, presence of viral mutation(s) within the  areas targeted by this assay, and inadequate number of viral copies  (<250 copies / mL). A negative result must be combined with clinical  observations, patient history, and epidemiological information. If result is POSITIVE SARS-CoV-2 target nucleic acids are DETECTED. The SARS-CoV-2 RNA is generally detectable in upper and lower  respiratory specimens dur ing the acute phase of infection.  Positive  results are indicative of active infection with SARS-CoV-2.  Clinical  correlation with patient history and other diagnostic information is  necessary to determine patient infection status.  Positive results do  not rule out bacterial infection or co-infection with other viruses. If result is PRESUMPTIVE POSTIVE SARS-CoV-2 nucleic acids MAY BE PRESENT.   A presumptive positive result was obtained on the submitted specimen  and confirmed on repeat testing.  While 2019 novel coronavirus  (SARS-CoV-2) nucleic acids may be present in the submitted sample  additional confirmatory testing may be necessary for epidemiological  and / or clinical management purposes  to differentiate between  SARS-CoV-2 and other Sarbecovirus currently known to infect humans.  If clinically indicated additional testing with an  alternate test  methodology [phone]) is advised. The SARS-CoV-2 RNA is generally  detectable in upper and lower respiratory sp ecimens during the acute  phase of infection. The expected result is Negative. Fact Sheet for Patients:  StrictlyIdeas.no Fact Sheet for Healthcare Providers: BankingDealers.co.za This test is not yet approved or cleared by the [place] FDA and has been authorized for detection and/or diagnosis of SARS-CoV-2 by FDA under an Emergency Use Authorization (EUA).  This EUA will remain in effect (meaning this test can be used) for the duration of the COVID-19 declaration under Section 564(b)(1) of the Act, 21 U.S.C. section 360bbb-3(b)(1), unless the authorization is terminated or revoked sooner. Performed at [facility], [place] [address], [place], [place] [address]   Protime-INR     Status: None   Collection Time: 10/18/18 12:27 PM  Result Value Ref Range   Prothrombin Time 13.9 11.4 - 15.2 seconds   INR 1.1 0.8 - 1.2    Comment: (NOTE) INR goal varies based on device and disease states. Performed at [facility], [place] [address] [place], [place] [address]   Type and screen [place]     Status: None   Collection Time: 10/18/18 12:27 PM  Result Value Ref Range   ABO/RH(D) O NEG    Antibody Screen NEG    Sample Expiration      10/21/2018,2359 Performed at [facility], [place] [address] [place], [place] [address]    Dg Thoracic Spine 2 View  Result Date: 10/18/2018 CLINICAL DATA:  [place] this morning.  Back pain. EXAM: THORACIC SPINE 2 VIEWS COMPARISON:  None. FINDINGS: Normal alignment of the thoracic vertebral bodies. No acute thoracic compression fractures identified. The visualized posterior ribs appear intact. There is tortuosity and calcification of the thoracic aorta. The visualized lungs are clear. IMPRESSION: Normal alignment  and no acute bony findings. Electronically Signed  By: Marijo Sanes M.D.   On: 10/18/2018 11:20   Dg Lumbar Spine Complete  Result Date: 10/18/2018 CLINICAL DATA:  [place].  Back pain. EXAM: LUMBAR SPINE - COMPLETE 4+ VIEW COMPARISON:  MRI lumbar spine from 2016 FINDINGS: Normal alignment of the lumbar vertebral bodies. No acute fractures identified. Interbody fusion noted at L5-S1. Right-sided sacral plasty changes are noted. IMPRESSION: No acute bony findings. Electronically Signed   By: Marijo Sanes M.D.   On: 10/18/2018 11:22   Dg Hip Unilat With Pelvis 2-3 Views Right  Result Date: 10/18/2018 CLINICAL DATA:  [place] this morning.  Right hip pain. EXAM: DG HIP (WITH OR WITHOUT PELVIS) 2-3V RIGHT COMPARISON:  None. FINDINGS: There is a mildly displaced high femoral neck/subcapital fracture with slight impaction and external rotation. Both hips are normally located. No significant degenerative changes given the patient's age. The pubic symphysis and SI joints are intact. No obvious pelvic fractures. Bone cement noted in the right sacrum from prior sacral plasty. IMPRESSION: Mildly displaced high femoral neck/subcapital fracture of the right hip. Electronically Signed   By: Marijo Sanes M.D.   On: 10/18/2018 11:18    Pending Labs Unresulted Labs (From admission, onward)    Start     Ordered   10/19/18 0500  CBC  Tomorrow morning,   R     10/18/18 1806   10/19/18 9323  Basic metabolic panel  Tomorrow morning,   R     10/18/18 1806          Vitals/Pain Today's Vitals   10/18/18 1915 10/18/18 2000 10/18/18 2045 10/18/18 2130  BP: (!) 153/93 135/68 (!) 118/53 135/73  Pulse: 81 69 75 71  Resp: 16 18 (!) 25 19  Temp:      TempSrc:      SpO2: 92% 94% 91% 90%  PainSc:        Isolation Precautions No active isolations  Medications Medications  anastrozole (ARIMIDEX) tablet 1 mg (has no administration in time range)  amLODipine (NORVASC) tablet 5 mg (5 mg Oral Given 10/18/18 1912)   furosemide (LASIX) tablet 30 mg (has no administration in time range)  sertraline (ZOLOFT) tablet 25 mg (25 mg Oral Given 10/18/18 1911)  temazepam (RESTORIL) capsule 15 mg (has no administration in time range)  docusate sodium (COLACE) capsule 100 mg (has no administration in time range)  pantoprazole (PROTONIX) EC tablet 40 mg (40 mg Oral Given 10/18/18 1912)  ondansetron (ZOFRAN) tablet 4 mg (has no administration in time range)  polyethylene glycol (MIRALAX / GLYCOLAX) packet 17 g (17 g Oral Given 10/18/18 1912)  Cholecalciferol 1,000 Units (has no administration in time range)  benzonatate (TESSALON) capsule 100 mg (has no administration in time range)  HYDROcodone-acetaminophen (NORCO/VICODIN) 5-325 MG per tablet 1-2 tablet (1 tablet Oral Given 10/18/18 1916)  morphine 2 MG/ML injection 0.5 mg (has no administration in time range)  apixaban (ELIQUIS) tablet 2.5 mg (has no administration in time range)  morphine 4 MG/ML injection 4 mg (4 mg Intravenous Given 10/18/18 1357)  ondansetron (ZOFRAN) injection 4 mg (4 mg Intravenous Given 10/18/18 1357)      Mobility walks with device

## 2018-10-18 NOTE — Anesthesia Preprocedure Evaluation (Addendum)
Anesthesia Evaluation  Patient identified by MRN, date of birth, ID band Patient awake    Reviewed: Allergy & Precautions, NPO status , Patient's Chart, lab work & pertinent test results  History of Anesthesia Complications (+) PROLONGED EMERGENCE and history of anesthetic complications  Airway Mallampati: II       Dental  (+) Teeth Intact, Dental Advisory Given   Pulmonary COPD,    breath sounds clear to auscultation       Cardiovascular hypertension, Pt. on medications +CHF  + dysrhythmias Atrial Fibrillation + Valvular Problems/Murmurs AS  Rhythm:Regular Rate:Normal   '20 TTE - mild concentric LVH. EF 55% to 60%. Mild AI and MR. Mildly dilated LA.     Neuro/Psych PSYCHIATRIC DISORDERS Anxiety  Vertigo     GI/Hepatic Neg liver ROS, hiatal hernia, GERD  Medicated,  Endo/Other  negative endocrine ROS  Renal/GU negative Renal ROS     Musculoskeletal  (+) Arthritis , Osteoarthritis,    Abdominal   Peds  Hematology negative hematology ROS (+)   Anesthesia Other Findings   Reproductive/Obstetrics                            Anesthesia Physical Anesthesia Plan  ASA: III  Anesthesia Plan: General   Post-op Pain Management:    Induction: Intravenous  PONV Risk Score and Plan: 4 or greater and Treatment may vary due to age or medical condition, Ondansetron and TIVA  Airway Management Planned: Oral ETT  Additional Equipment: None  Intra-op Plan:   Post-operative Plan: Extubation in OR  Informed Consent: I have reviewed the patients History and Physical, chart, labs and discussed the procedure including the risks, benefits and alternatives for the proposed anesthesia with the patient or authorized representative who has indicated his/her understanding and acceptance.   Patient has DNR.  Discussed DNR with patient and Suspend DNR.   Dental advisory given  Plan Discussed with:  CRNA and Anesthesiologist  Anesthesia Plan Comments:       Anesthesia Quick Evaluation

## 2018-10-18 NOTE — ED Provider Notes (Signed)
[facility] DEPT Provider Note   CSN: [id number] Arrival date & time: 10/18/18  0915     History   Chief Complaint Chief Complaint  Patient presents with  . Fall  . Hip Pain    HPI Glenda [name] is a 85 y.o. female with a hx of CHF, HTN, hypercholesterolemia, chronic pain syndrome, breast cancer s/p mastectomy, prior sacral fx s/p sacroplasty, R knee total arthroplasty, & aflutter on Eliquis who presents to the ED via EMS s/p mechanical fall @ 3:30 this AM with complaints of R hip pain. Patient states she got out of bed & held on to her rolling walker that she uses @ baseline, but unfortunately the walker rolled out from under her causing her to fall to the ground onto her R side. She denies head injury or LOC. She states that she was unable to get up on her own & used her alert system to call for assistance. She has not weightbeared since the fall. She is complaining of R hip pain that is not particularly present @ rest, but is most notable with movement or attempt to put weight on it. She states she had chronic mid to lower back pain which is unchanged. Denies prodromal lightheadedness, dizziness, chest pain, or dyspnea. Denies headache, neck pain, chest pain, abdominal pain, numbness, tingling, or focal weakness. Patient lives alone @ assisted living facility. Last PO intake was small amount of water with morning meds.     HPI  Past Medical History:  Diagnosis Date  . Aortic stenosis    Echo 02/2011 showing mild AS with normal LV systolic function  . Arthritis    "lower back" (10/09/2015)  . Atrial flutter with rapid ventricular response ([place]) 08/19/2016   s/p successful DCCV on 08/20/16, continue eliquis  . Chronic bronchitis ([place])    "off and on; several years" (10/09/2015)  . Chronic diastolic CHF (congestive heart failure) ([place])    a. 12/2015 Echo: EF 55-60%, mild AI/MR, mildly dil LA.  Marland Kitchen Chronic lower back pain   . Complication of anesthesia    "they had trouble waking me up after colon resection"  . Confusion    Originally listed as TIA-pt denies this hx on 10/09/2015 "it was the [place] I was taking; they had thought I was having a st originally roke"  . DDD (degenerative disc disease), lumbar    severe facet dz and adv DDD MRI L spine 2009  . Diverticulosis   . GERD (gastroesophageal reflux disease)   . Hiatal hernia    hx  . Hypercholesterolemia   . Hypertension   . Insomnia   . OA (osteoarthritis) of knee   . Paroxysmal atrial flutter ([place])    a. 09/2015 s/p TEE/DCCV;  b. CHA2DS2VASc = 5-->Eliquis.  . Peripheral edema   . PVCs (premature ventricular contractions)   . Sacral fracture ([place]) 11/06/2012  . Vertigo 11/19/2012    Patient Active Problem List   Diagnosis Date Noted  . Anxiety 08/08/2018  . Abdominal hernia 05/19/2018  . Chronic pain syndrome 02/09/2018  . Chronic narcotic use 08/11/2017  . Malignant neoplasm of upper-outer quadrant of left breast in female, estrogen receptor positive ([place]) 06/27/2017  . History of cardioversion 08/20/2016  . Atrial flutter ([place])   . Shortness of breath 12/13/2014  . Palpitations 12/13/2014  . Constipation 11/06/2012  . Accelerated/malignant hypertension 09/14/2012  . Aortic stenosis, mild 07/11/2012  . Chronic diastolic HF (heart failure) ([place]) 07/11/2012  . Osteoarthritis 04/03/2012  .  Anemia 06/10/2011  . Essential hypertension 03/25/2011  . Insomnia 09/15/2010  . Hypercholesterolemia 09/15/2010    Past Surgical History:  Procedure Laterality Date  . BREAST BIOPSY Left   . CARDIAC CATHETERIZATION  02/17/2001   MILD REGURGITATION. EF 60%  . CARDIOVERSION N/A 10/08/2015   Procedure: CARDIOVERSION;  Surgeon: Sanda Klein, MD;  Location: [facility] END[facility];  Service: Cardiovascular;  Laterality: N/A;  . CARDIOVERSION N/A 08/20/2016   Procedure: Cardioversion;  Surgeon: Evans Lance, MD;  Location: [facility] CV LAB;  Service: Cardiovascular;  Laterality: N/A;  .  CATARACT EXTRACTION W/ INTRAOCULAR LENS  IMPLANT, BILATERAL Bilateral   . CESAREAN SECTION  1954  . COLECTOMY     related to "blockage"  . COLONOSCOPY    . EXCISIONAL HEMORRHOIDECTOMY    . INGUINAL HERNIA REPAIR Bilateral   . KYPHOPLASTY Right 09/11/2012   Procedure: Right Acrylic Sacroplasty;  Surgeon: Kristeen Miss, MD;  Location: [facility]UR[facility];  Service: Neurosurgery;  Laterality: Right;  Right  Acrylic Sacroplasty  . TEE WITHOUT CARDIOVERSION N/A 10/08/2015   Procedure: TRANSESOPHAGEAL ECHOCARDIOGRAM (TEE);  Surgeon: Sanda Klein, MD;  Location: [facility] END[facility]COPY;  Service: Cardiovascular;  Laterality: N/A;  . TOTAL KNEE ARTHROPLASTY Right   . TOTAL MASTECTOMY Left 07/22/2017   Procedure: TOTAL MASTECTOMY;  Surgeon: Excell Seltzer, MD;  Location: [facility];  Service: General;  Laterality: Left;  . UMBILICAL HERNIA REPAIR       OB History   No obstetric history on file.      Home Medications    Prior to Admission medications   Medication Sig Start Date End Date Taking? Authorizing Provider  amLODipine (NORVASC) 5 MG tablet Take 1 tablet (5 mg total) by mouth daily. 03/31/18   Almyra Deforest, PA  anastrozole (ARIMIDEX) 1 MG tablet Take 1 tablet (1 mg total) by mouth daily. 10/10/18   Nicholas Lose, MD  apixaban (ELIQUIS) 2.5 MG TABS tablet Take 1 tablet (2.5 mg total) by mouth 2 (two) times daily. 07/24/17   Greer Pickerel, MD  chlorhexidine (PERIDEX) 0.12 % solution Use as directed 5 mLs in the mouth or throat 2 (two) times daily.  05/11/18   [provider]  Cholecalciferol 1000 units tablet Take 1,000 Units by mouth daily.    [provider]  Cranberry 425 MG CAPS Take 425 mg by mouth 2 (two) times daily.    [provider]  docusate sodium (COLACE) 100 MG capsule Take 100 mg by mouth 2 (two) times daily.    [provider]  furosemide (LASIX) 20 MG tablet Take 1.5 tablets (30 mg total) by mouth daily. 08/26/16   Baldwin Jamaica, PA-C  HYDROcodone-acetaminophen  (NORCO/VICODIN) 5-325 MG tablet Take 1 tablet by mouth 2 (two) times daily. May refill 1 month. At 9 Am and 3 PM. Separate by 6 hours from temazepam. 08/08/18   Marin Olp, MD  HYDROcodone-acetaminophen (NORCO/VICODIN) 5-325 MG tablet Take 1 tablet by mouth 2 (two) times daily. May refill today At 9 Am and 3 PM. Separate by 6 hours from temazepam. 08/08/18   Marin Olp, MD  HYDROcodone-acetaminophen (NORCO/VICODIN) 5-325 MG tablet Take 1 tablet by mouth 2 (two) times a day. At 9 AM and 3 pm. separate by 6 hours from temazepam. May refill in 2 months. 08/08/18   Marin Olp, MD  losartan (COZAAR) 100 MG tablet Take 1 tablet (100 mg total) by mouth daily. 07/12/17   Marin Olp, MD  Multiple Vitamins-Minerals (CEROVITE ADVANCED FORMULA PO) Take  1 tablet by mouth daily. (0800)    [provider]  omeprazole (PRILOSEC) 40 MG capsule Take 40 mg by mouth daily. 08/30/17   [provider]  ondansetron (ZOFRAN) 4 MG tablet Take 4 mg by mouth every 6 (six) hours as needed for nausea.    [provider]  polyethylene glycol (MIRALAX / GLYCOLAX) packet Take 17 g by mouth daily. Taken  Daily Monday to Saturday.    [provider]  sertraline (ZOLOFT) 25 MG tablet TAKE (1) TABLET BY MOUTH ONCE DAILY. 09/15/18   Marin Olp, MD  temazepam (RESTORIL) 15 MG capsule Take 1 capsule (15 mg total) by mouth at bedtime. Should separate from hydrocodone by at least 6 hours. 07/06/18   Marin Olp, MD    Family History Family History  Problem Relation Age of Onset  . Heart disease Mother   . Hypertension Father   . Other Sister        79 in 2019  . Other Sister        74 in 2019  . Heart disease Sister   . Heart attack Neg Hx   . Stroke Neg Hx     Social History Social History   Tobacco Use  . Smoking status: Never Smoker  . Smokeless tobacco: Never Used  Substance Use Topics  . Alcohol use: No  . Drug use: No     Allergies   Ace  inhibitors, Halcion [triazolam], Pentazocine lactate, Sulfa drugs cross reactors, Amitriptyline, Avelox [moxifloxacin hcl in nacl], and Penicillins   Review of Systems Review of Systems  Constitutional: Negative for chills and fever.  Eyes: Negative for visual disturbance.  Respiratory: Negative for shortness of breath.   Cardiovascular: Negative for chest pain.  Gastrointestinal: Negative for abdominal pain and vomiting.  Musculoskeletal: Positive for arthralgias and back pain (chronic unchanged). Negative for neck pain.  Neurological: Negative for dizziness, seizures, syncope, speech difficulty, weakness, light-headedness, numbness and headaches.  All other systems reviewed and are negative.  Physical Exam Vital Signs BP (!) 166/68 (BP Location: Right Arm)   Pulse 68   Temp 98.3 F (36.8 C) (Oral)   Resp 18   SpO2 95%   Physical Exam Vitals signs and nursing note reviewed.  Constitutional:      General: She is not in acute distress.    Appearance: She is well-developed.  HENT:     Head: Normocephalic and atraumatic. No raccoon eyes or Battle's sign.     Right Ear: No hemotympanum.     Left Ear: No hemotympanum.     Ears:     Comments: Hearing aids present.  No hemotympanum.     Mouth/Throat:     Mouth: Mucous membranes are moist.     Comments: Uvula midline.  Eyes:     General:        Right eye: No discharge.        Left eye: No discharge.     Extraocular Movements: Extraocular movements intact.     Conjunctiva/sclera: Conjunctivae normal.     Pupils: Pupils are equal, round, and reactive to light.  Neck:     Musculoskeletal: Normal range of motion. No spinous process tenderness or muscular tenderness.  Cardiovascular:     Rate and Rhythm: Normal rate and regular rhythm.     Pulses:          Dorsalis pedis pulses are 2+ on the right side and 2+ on the left side.  Posterior tibial pulses are 2+ on the right side and 2+ on the left side.     Heart sounds: No  murmur.  Pulmonary:     Effort: No respiratory distress.     Breath sounds: Normal breath sounds. No wheezing or rales.  Abdominal:     General: There is no distension.     Palpations: Abdomen is soft.     Tenderness: There is no abdominal tenderness. There is no guarding or rebound.  Musculoskeletal:     Comments: No obvious deformities, appreciable swelling, significant ecchymosis, or significant open wounds.  Upper extremities: Intact AROM. No focal bony tenderness.  Back: Patient has diffuse midline lower thoracic & upper lumbar tenderness- no point/focal vertebral tenderness or palpable step off- she states this is unchanged from her typical pain s/p fall. Spine is otherwise nontender.  Lower extremities: Intact AROM throughout LLE. RLE hip & knee limited secondary to her R hip pain per her report, intact AROM to the R ankle. Patient tender to palpation to the anterior, lateral, & posterior R hip. Otherwise LEs are nontender.   Skin:    General: Skin is warm and dry.     Capillary Refill: Capillary refill takes less than 2 seconds.     Findings: No rash.  Neurological:     Comments: Alert. Clear speech. CN III-XII grossly intact. Sensation grossly intact x 4. 5/5 symmetric grip strength & strength with plantar/dorsiflexion bilaterally.   Psychiatric:        Behavior: Behavior normal.    ED Treatments / Results  Labs (all labs ordered are listed, but only abnormal results are displayed) Labs Reviewed  BASIC METABOLIC PANEL - Abnormal; Notable for the following components:      Result Value   Sodium 134 (*)    Chloride 96 (*)    Glucose, Bld 123 (*)    BUN 26 (*)    All other components within normal limits  SARS CORONAVIRUS 2 (HOSPITAL ORDER, [facility])  CBC  PROTIME-INR  TYPE AND SCREEN    EKG EKG Interpretation  Date/Time:  Wednesday October 18 2018 13:07:49 EDT Ventricular Rate:  62 PR Interval:    QRS Duration: 100 QT Interval:  438 QTC  Calculation: 452 R Axis:   136 Text Interpretation:  Sinus rhythm Short PR interval Right axis deviation Low voltage, extremity leads Confirmed by Lennice Sites [phone]) on 10/18/2018 1:42:59 PM   Radiology Dg Thoracic Spine 2 View  Result Date: 10/18/2018 CLINICAL DATA:  [place] this morning.  Back pain. EXAM: THORACIC SPINE 2 VIEWS COMPARISON:  None. FINDINGS: Normal alignment of the thoracic vertebral bodies. No acute thoracic compression fractures identified. The visualized posterior ribs appear intact. There is tortuosity and calcification of the thoracic aorta. The visualized lungs are clear. IMPRESSION: Normal alignment and no acute bony findings. Electronically Signed   By: Marijo Sanes M.D.   On: 10/18/2018 11:20   Dg Lumbar Spine Complete  Result Date: 10/18/2018 CLINICAL DATA:  [place].  Back pain. EXAM: LUMBAR SPINE - COMPLETE 4+ VIEW COMPARISON:  MRI lumbar spine from 2016 FINDINGS: Normal alignment of the lumbar vertebral bodies. No acute fractures identified. Interbody fusion noted at L5-S1. Right-sided sacral plasty changes are noted. IMPRESSION: No acute bony findings. Electronically Signed   By: Marijo Sanes M.D.   On: 10/18/2018 11:22   Dg Hip Unilat With Pelvis 2-3 Views Right  Result Date: 10/18/2018 CLINICAL DATA:  [place] this morning.  Right hip pain. EXAM:  DG HIP (WITH OR WITHOUT PELVIS) 2-3V RIGHT COMPARISON:  None. FINDINGS: There is a mildly displaced high femoral neck/subcapital fracture with slight impaction and external rotation. Both hips are normally located. No significant degenerative changes given the patient's age. The pubic symphysis and SI joints are intact. No obvious pelvic fractures. Bone cement noted in the right sacrum from prior sacral plasty. IMPRESSION: Mildly displaced high femoral neck/subcapital fracture of the right hip. Electronically Signed   By: Marijo Sanes M.D.   On: 10/18/2018 11:18    Procedures Procedures (including critical care time)   Medications Ordered in ED Medications  morphine 4 MG/ML injection 4 mg (has no administration in time range)  ondansetron (ZOFRAN) injection 4 mg (has no administration in time range)     Initial Impression / Assessment and Plan / ED Course  I have reviewed the triage vital signs and the nursing notes.  Pertinent labs & imaging results that were available during my care of the patient were reviewed by me and considered in my medical decision making (see chart for details).   Patient presents to the ED s/p mechanical fall w/ complaints or R hip pain, also noted to have some chronic mid to lower back pain as well that is unchanged s/p fall. Nontoxic appearing, no apparent distress, vitals WNL with the exception of elevated BP- doubt HTN emergency. Denies head injury or LOC - no signs of head trauma on exam, C spine nontender. Does have some mid to lower back tenderness which I suspect is chronic but will x-ray precautionarily. Primarily tender of the R hip w/ limited ROM- no obvious deformity, NVI distally- will x-ray. Basic labs in case need for admission. Offered pain medication, patient declined at this time & is aware to let nursing staff know should she change her mind.    CBC: No anemia/leukocytosis BMP: Mild electrolyte abnormalities/hyperglycemia- no significant derangement. BUN mildly up, creatinine WNL EKG: NO STEMI.   Imaging notable for: Mildly displaced high femoral neck/subcapital fracture of the right hip. Remains NVI distally.   Will consult orthopedic surgery. Covid testing ordered.  Patient updated on results & plan of care, provided opportunity for questions, she has confirmed understanding & is agreeable.   12:01: CONSULT: Discussed case with orthopedic surgeon Dr. Lucia Gaskins- requesting admission to hospitlalist service to [facility]- OR this evening or tomorrow.   12:42: CONSULT: Discussed case with hospitalist Dr. Tyrell Antonio who accepts admission- requesting cardiology  consultation for medical clearance.   12:50: CONSUTL: Discussed with cards master- cardiology will consult on  arrival to [facility].   Covid negative.  Glenda [name] was evaluated in Emergency Department on 10/18/2018 for the symptoms described in the history of present illness. He/she was evaluated in the context of the global COVID-19 pandemic, which necessitated consideration that the patient might be at risk for infection with the SARS-CoV-2 virus that causes COVID-19. Institutional protocols and algorithms that pertain to the evaluation of patients at risk for COVID-19 are in a state of rapid change based on information released by regulatory bodies including the CDC and federal and state organizations. These policies and algorithms were followed during the patient's care in the ED.  Findings and plan of care discussed with supervising physician Dr. Ronnald Nian who has evaluated patient & is in agreement.    Final Clinical Impressions(s) / ED Diagnoses   Final diagnoses:  Fall, initial encounter  Closed displaced fracture of right femoral neck [facility])    ED Discharge Orders  None       Amaryllis Dyke, PA-C 10/18/18 1413    Lennice Sites, DO 10/18/18 1456

## 2018-10-18 NOTE — ED Provider Notes (Signed)
Medical screening examination/treatment/procedure(s) were conducted as a shared visit with non-physician practitioner(s) and myself.  I personally evaluated the patient during the encounter. Briefly, the patient is a 85 y.o. female with history of heart failure, hypertension, high cholesterol, a flutter on Eliquis who presents to the ED after mechanical fall.  Patient with right hip pain.  She states that she was using her walker and it fell out from under her and she hit the right side of her hip.  She did not hit her head or lose consciousness.  Patient is neurologically intact.  No concern for intracranial process.  X-ray showed right hip fracture.  Orthopedics to be consulted for surgery.  Patient to be admitted to hospitalist service.  Patient with good pulses on exam.  This chart was dictated using voice recognition software.  Despite best efforts to proofread,  errors can occur which can change the documentation meaning.     EKG Interpretation None           Lennice Sites, DO 10/18/18 1236

## 2018-10-18 NOTE — Consult Note (Signed)
Brief orthopedic consult note Full consult note to follow  85 year old female with history of heart failure, hypertension, hypercholesterolemia and atrial flutter on Eliquis who fell at home this morning when her walker came out from under her.  She hit her right hip.  She was taken to the emergency department where x-rays revealed a right valgus impacted femoral neck fracture.  Orthopedics was consulted.  I have reviewed the imaging.  Patient is indicated for closed reduction and percutaneous pinning of her right hip if deemed medically stable for surgery.  I have requested the patient be transferred to [facility] where she will be admitted to the hospitalist service per protocol.  We will make the patient n.p.o. past midnight and plan for surgery in the morning.  Bed rest. No need to stop Eliquis for this percutaneous procedure.  We will discuss the plan with the patient once she is transferred to [facility].

## 2018-10-18 NOTE — H&P (Signed)
History and Physical  Glenda [name] EXH:[id number] DOB: 03/31/1924 DOA: 10/18/2018  PCP: Marin Olp, MD Patient coming from:   I have personally briefly reviewed patient's old medical records in [place]   Chief Complaint: Fall.  HPI: Glenda [name] is a 85 y.o. female with past medical history significant for aortic stenosis, a flutter status post successful cardioversion 08/20/2016 on Eliquis, hypertension, breast cancer status post mastectomy, who present complaining of right hip pain after a fall.  Patient relate that she got out of her bed and Bonnita Nasuti to her rolling walker, her walker was not secure and she rule out and fell on her right side.  She denies loss of consciousness or head injury.  She is complaining of right hip pain since after the fall. She denies chest pain, shortness of breath, lightheadedness.  She reports that she is able to use her walker, and walk the halls of her facility without any chest pain or shortness of breath.  Evaluation in the ED: Sodium 134, chloride 96 BUN 26, hemoglobin 13, INR 1.1, COVID-19 negative.  Number spine no acute finding.  Hip x-ray:Mildly displaced high femoral neck/subcapital fracture of the right hip.  Review of Systems: All systems reviewed and apart from history of presenting illness, are negative.  Past Medical History:  Diagnosis Date  . Aortic stenosis    Echo 02/2011 showing mild AS with normal LV systolic function  . Arthritis    "lower back" (10/09/2015)  . Atrial flutter with rapid ventricular response ([place]) 08/19/2016   s/p successful DCCV on 08/20/16, continue eliquis  . Chronic bronchitis ([place])    "off and on; several years" (10/09/2015)  . Chronic diastolic CHF (congestive heart failure) ([place])    a. 12/2015 Echo: EF 55-60%, mild AI/MR, mildly dil LA.  Marland Kitchen Chronic lower back pain   . Complication of anesthesia    "they had trouble waking me up after colon resection"  . Confusion    Originally listed as  TIA-pt denies this hx on 10/09/2015 "it was the [place] I was taking; they had thought I was having a st originally roke"  . DDD (degenerative disc disease), lumbar    severe facet dz and adv DDD MRI L spine 2009  . Diverticulosis   . Femur fracture, right ([place]) 10/18/2018  . GERD (gastroesophageal reflux disease)   . Hiatal hernia    hx  . Hypercholesterolemia   . Hypertension   . Insomnia   . OA (osteoarthritis) of knee   . Paroxysmal atrial flutter ([place])    a. 09/2015 s/p TEE/DCCV;  b. CHA2DS2VASc = 5-->Eliquis.  . Peripheral edema   . PVCs (premature ventricular contractions)   . Sacral fracture ([place]) 11/06/2012  . Vertigo 11/19/2012   Past Surgical History:  Procedure Laterality Date  . BREAST BIOPSY Left   . CARDIAC CATHETERIZATION  02/17/2001   MILD REGURGITATION. EF 60%  . CARDIOVERSION N/A 10/08/2015   Procedure: CARDIOVERSION;  Surgeon: Sanda Klein, MD;  Location: [facility];  Service: Cardiovascular;  Laterality: N/A;  . CARDIOVERSION N/A 08/20/2016   Procedure: Cardioversion;  Surgeon: Evans Lance, MD;  Location: [facility];  Service: Cardiovascular;  Laterality: N/A;  . CATARACT EXTRACTION W/ INTRAOCULAR LENS  IMPLANT, BILATERAL Bilateral   . CESAREAN SECTION  1954  . COLECTOMY     related to "blockage"  . COLONOSCOPY    . EXCISIONAL HEMORRHOIDECTOMY    . INGUINAL HERNIA REPAIR Bilateral   . KYPHOPLASTY  Right 09/11/2012   Procedure: Right Acrylic Sacroplasty;  Surgeon: Kristeen Miss, MD;  Location: [facility];  Service: Neurosurgery;  Laterality: Right;  Right  Acrylic Sacroplasty  . TEE WITHOUT CARDIOVERSION N/A 10/08/2015   Procedure: TRANSESOPHAGEAL ECHOCARDIOGRAM (TEE);  Surgeon: Sanda Klein, MD;  Location: [facility];  Service: Cardiovascular;  Laterality: N/A;  . TOTAL KNEE ARTHROPLASTY Right   . TOTAL MASTECTOMY Left 07/22/2017   Procedure: TOTAL MASTECTOMY;  Surgeon: Excell Seltzer, MD;  Location: [facility];  Service: General;  Laterality: Left;   . UMBILICAL HERNIA REPAIR     Social History:  reports that she has never smoked. She has never used smokeless tobacco. She reports that she does not drink alcohol or use drugs.   Allergies  Allergen Reactions  . Ace Inhibitors Other (See Comments)    Cough  . Halcion [Triazolam]     UNSPECIFIED REACTION   . Pentazocine Lactate     UNSPECIFIED REACTION   . Sulfa Drugs Cross Reactors     UNSPECIFIED REACTION   . Trazodone And Nefazodone Other (See Comments)    Per MAR  . Amitriptyline Other (See Comments)    Hyperactivity  . Avelox [Moxifloxacin Hcl In Nacl] Other (See Comments)    dizziness  . Penicillins Rash    Family History  Problem Relation Age of Onset  . Heart disease Mother   . Hypertension Father   . Other Sister        66 in 2019  . Other Sister        44 in 2019  . Heart disease Sister   . Heart attack Neg Hx   . Stroke Neg Hx     Prior to Admission medications   Medication Sig Start Date End Date Taking? Authorizing Provider  amLODipine (NORVASC) 5 MG tablet Take 1 tablet (5 mg total) by mouth daily. 03/31/18  Yes Almyra Deforest, PA  anastrozole (ARIMIDEX) 1 MG tablet Take 1 tablet (1 mg total) by mouth daily. 10/10/18  Yes Nicholas Lose, MD  apixaban (ELIQUIS) 2.5 MG TABS tablet Take 1 tablet (2.5 mg total) by mouth 2 (two) times daily. 07/24/17  Yes Greer Pickerel, MD  benzonatate (TESSALON) 100 MG capsule Take 100 mg by mouth every 8 (eight) hours as needed for cough.   Yes [provider]  Cholecalciferol 1000 units tablet Take 1,000 Units by mouth daily.   Yes [provider]  Cranberry 425 MG CAPS Take 425 mg by mouth 2 (two) times daily.   Yes [provider]  docusate sodium (COLACE) 100 MG capsule Take 100 mg by mouth 2 (two) times daily.   Yes [provider]  furosemide (LASIX) 20 MG tablet Take 1.5 tablets (30 mg total) by mouth daily. 08/26/16  Yes Baldwin Jamaica, PA-C  HYDROcodone-acetaminophen (NORCO/VICODIN) 5-325  MG tablet Take 1 tablet by mouth 2 (two) times a day. At 9 AM and 3 pm. separate by 6 hours from temazepam. May refill in 2 months. 08/08/18  Yes Marin Olp, MD  losartan (COZAAR) 100 MG tablet Take 1 tablet (100 mg total) by mouth daily. 07/12/17  Yes Marin Olp, MD  Multiple Vitamins-Minerals (CEROVITE ADVANCED FORMULA PO) Take 1 tablet by mouth daily. (0800)   Yes [provider]  omeprazole (PRILOSEC) 40 MG capsule Take 40 mg by mouth daily. 08/30/17  Yes [provider]  ondansetron (ZOFRAN) 4 MG tablet Take 4 mg by mouth every 6 (six) hours as needed for nausea.  Yes [provider]  polyethylene glycol (MIRALAX / GLYCOLAX) packet Take 17 g by mouth daily. Taken  Daily Monday to Saturday.   Yes [provider]  sertraline (ZOLOFT) 25 MG tablet TAKE (1) TABLET BY MOUTH ONCE DAILY. Patient taking differently: Take 25 mg by mouth daily. TAKE (1) TABLET BY MOUTH ONCE DAILY. 09/15/18  Yes Marin Olp, MD  temazepam (RESTORIL) 15 MG capsule Take 1 capsule (15 mg total) by mouth at bedtime. Should separate from hydrocodone by at least 6 hours. 07/06/18  Yes Marin Olp, MD  HYDROcodone-acetaminophen (NORCO/VICODIN) 5-325 MG tablet Take 1 tablet by mouth 2 (two) times daily. May refill 1 month. At 9 Am and 3 PM. Separate by 6 hours from temazepam. Patient not taking: Reported on 10/18/2018 08/08/18   Marin Olp, MD  HYDROcodone-acetaminophen (NORCO/VICODIN) 5-325 MG tablet Take 1 tablet by mouth 2 (two) times daily. May refill today At 73 Am and 3 PM. Separate by 6 hours from temazepam. Patient not taking: Reported on 10/18/2018 08/08/18   Marin Olp, MD   Physical Exam: Vitals:   10/18/18 1401 10/18/18 1430 10/18/18 1445 10/18/18 1530  BP: (!) 142/65  128/72 (!) 144/76  Pulse: 79 (!) 58 60 70  Resp: 18 16 19 17   Temp:      TempSrc:      SpO2: 94% 91% 91% 92%     General exam: Moderately built and nourished patient, lying  comfortably supine on the gurney in no obvious distress.  Head, eyes and ENT: Nontraumatic and normocephalic. Pupils equally reacting to light and accommodation. Oral mucosa moist.  Neck: Supple. No JVD, carotid bruit or thyromegaly.  Lymphatics: No lymphadenopathy.  Respiratory system: Clear to auscultation. No increased work of breathing.  Cardiovascular system: S1 and S2 heard, RRR. No JVD, murmurs, gallops, clicks or pedal edema.  Gastrointestinal system: Abdomen is nondistended, soft and nontender. Normal bowel sounds heard. No organomegaly or masses appreciated.  Central nervous system: Alert and oriented. No focal neurological deficits.  Extremities:  Peripheral pulses symmetrically felt.  Right lower extremity shorter than the left.  Bruise in the right hip  Skin: No rashes or acute findings.  Musculoskeletal system: Negative exam.  Psychiatry: Pleasant and cooperative.   Labs on Admission:  Basic Metabolic Panel: Recent Labs  Lab 10/18/18 1102  NA 134*  K 3.8  CL 96*  CO2 26  GLUCOSE 123*  BUN 26*  CREATININE 0.73  CALCIUM 9.0   Liver Function Tests: No results for input(s): AST, ALT, ALKPHOS, BILITOT, PROT, ALBUMIN in the last 168 hours. No results for input(s): LIPASE, AMYLASE in the last 168 hours. No results for input(s): AMMONIA in the last 168 hours. CBC: Recent Labs  Lab 10/18/18 1102  WBC 9.4  HGB 13.8  HCT 42.1  MCV 90.9  PLT 189   Cardiac Enzymes: No results for input(s): CKTOTAL, CKMB, CKMBINDEX, TROPONINI in the last 168 hours.  BNP (last 3 results) No results for input(s): PROBNP in the last 8760 hours. CBG: No results for input(s): GLUCAP in the last 168 hours.  Radiological Exams on Admission: Dg Thoracic Spine 2 View  Result Date: 10/18/2018 CLINICAL DATA:  [place] this morning.  Back pain. EXAM: THORACIC SPINE 2 VIEWS COMPARISON:  None. FINDINGS: Normal alignment of the thoracic vertebral bodies. No acute thoracic compression  fractures identified. The visualized posterior ribs appear intact. There is tortuosity and calcification of the thoracic aorta. The visualized lungs are clear. IMPRESSION: Normal alignment and  no acute bony findings. Electronically Signed   By: Marijo Sanes M.D.   On: 10/18/2018 11:20   Dg Lumbar Spine Complete  Result Date: 10/18/2018 CLINICAL DATA:  [place].  Back pain. EXAM: LUMBAR SPINE - COMPLETE 4+ VIEW COMPARISON:  MRI lumbar spine from 2016 FINDINGS: Normal alignment of the lumbar vertebral bodies. No acute fractures identified. Interbody fusion noted at L5-S1. Right-sided sacral plasty changes are noted. IMPRESSION: No acute bony findings. Electronically Signed   By: Marijo Sanes M.D.   On: 10/18/2018 11:22   Dg Hip Unilat With Pelvis 2-3 Views Right  Result Date: 10/18/2018 CLINICAL DATA:  [place] this morning.  Right hip pain. EXAM: DG HIP (WITH OR WITHOUT PELVIS) 2-3V RIGHT COMPARISON:  None. FINDINGS: There is a mildly displaced high femoral neck/subcapital fracture with slight impaction and external rotation. Both hips are normally located. No significant degenerative changes given the patient's age. The pubic symphysis and SI joints are intact. No obvious pelvic fractures. Bone cement noted in the right sacrum from prior sacral plasty. IMPRESSION: Mildly displaced high femoral neck/subcapital fracture of the right hip. Electronically Signed   By: Marijo Sanes M.D.   On: 10/18/2018 11:18    EKG: Independently reviewed.  Rhythm right axis deviation  Assessment/Plan Principal Problem:   Hip fracture (HCC) Active Problems:   Hypercholesterolemia   Essential hypertension   Chronic diastolic HF (heart failure) (HCC)   Atrial flutter (HCC)   Malignant neoplasm of upper-outer quadrant of left breast in female, estrogen receptor positive (HCC)   Chronic narcotic use   Chronic pain syndrome   Femur fracture, right (HCC)   1 Right hip fracture; Patient presented after mechanical fall  found to have a right hip fracture. Has multiple risk factor, age, history of a flutter, diastolic heart failure, aortic stenosis.  I have consulted cardiology for preop evaluation and clearance Patient will be transferred to [facility] per Orthopedic request.  2-History of a flutter: Currently sinus rhythm.  Monitor on telemetry.  Continue with Eliquis no need to hold per orthopedic doctor.   3-Hypertension: Continue with Norvasc.  Hold Cozaar preop to avoid AKI.  4-Diastolic heart failure; Continue with Lasix. Need to renal function.  5-Malignant neoplasm of the left breast: Follow with Dr. Sonny Dandy. 6-mild hyponatremia: Monitor for now.  DVT Prophylaxis: on eliquis Code Status: DNR  Family Communication: Try to contact son, did not answer phone.  Care discussed with patient Disposition Plan: Admit to the hospital for surgery of right hip fracture  Time spent: 75 minutes.      Elmarie Shiley MD [facility]   10/18/2018, 3:40 PM

## 2018-10-18 NOTE — ED Triage Notes (Signed)
She states she fell last night upon arising and attempting to go to the bathroom. She states her "walker slipped" which caused her to fall. No L.O.C./no head/neck pain. Her only c/o discomfort is of right hip. She is alert and oriented x 4 with clear speech.

## 2018-10-19 ENCOUNTER — Inpatient Hospital Stay (HOSPITAL_COMMUNITY): Payer: Medicare Other | Admitting: Anesthesiology

## 2018-10-19 ENCOUNTER — Other Ambulatory Visit: Payer: Self-pay

## 2018-10-19 ENCOUNTER — Inpatient Hospital Stay (HOSPITAL_COMMUNITY): Payer: Medicare Other

## 2018-10-19 ENCOUNTER — Encounter (HOSPITAL_COMMUNITY)
Admission: EM | Disposition: A | Discharge status: Skilled Nursing Facility | Payer: Self-pay | Source: Home / Self Care | Attending: Internal Medicine

## 2018-10-19 ENCOUNTER — Encounter (HOSPITAL_COMMUNITY): Payer: Self-pay

## 2018-10-19 DIAGNOSIS — W19XXXA Unspecified fall, initial encounter: Secondary | ICD-10-CM

## 2018-10-19 DIAGNOSIS — I5032 Chronic diastolic (congestive) heart failure: Secondary | ICD-10-CM

## 2018-10-19 DIAGNOSIS — E871 Hypo-osmolality and hyponatremia: Secondary | ICD-10-CM | POA: Diagnosis present

## 2018-10-19 DIAGNOSIS — I10 Essential (primary) hypertension: Secondary | ICD-10-CM

## 2018-10-19 DIAGNOSIS — S72001D Fracture of unspecified part of neck of right femur, subsequent encounter for closed fracture with routine healing: Secondary | ICD-10-CM

## 2018-10-19 DIAGNOSIS — F1190 Opioid use, unspecified, uncomplicated: Secondary | ICD-10-CM

## 2018-10-19 DIAGNOSIS — I4892 Unspecified atrial flutter: Secondary | ICD-10-CM

## 2018-10-19 DIAGNOSIS — G894 Chronic pain syndrome: Secondary | ICD-10-CM

## 2018-10-19 DIAGNOSIS — Z01810 Encounter for preprocedural cardiovascular examination: Secondary | ICD-10-CM

## 2018-10-19 HISTORY — PX: HIP PINNING,CANNULATED: SHX1758

## 2018-10-19 LAB — TYPE AND SCREEN
ABO/RH(D): O NEG
Antibody Screen: NEGATIVE

## 2018-10-19 LAB — SURGICAL PCR SCREEN
MRSA, PCR: NEGATIVE
Staphylococcus aureus: NEGATIVE

## 2018-10-19 LAB — ABO/RH: ABO/RH(D): O NEG

## 2018-10-19 SURGERY — FIXATION, FEMUR, NECK, PERCUTANEOUS, USING SCREW
Anesthesia: General | Laterality: Right

## 2018-10-19 MED ORDER — CEFAZOLIN SODIUM-DEXTROSE 1-4 GM/50ML-% IV SOLN
1.0000 g | Freq: Three times a day (TID) | INTRAVENOUS | Status: AC
  Administered 2018-10-19 – 2018-10-20 (×3): 1 g via INTRAVENOUS
  Filled 2018-10-19 (×3): qty 50

## 2018-10-19 MED ORDER — SUGAMMADEX SODIUM 200 MG/2ML IV SOLN
INTRAVENOUS | Status: DC | PRN
  Administered 2018-10-19: 150 mg via INTRAVENOUS

## 2018-10-19 MED ORDER — ONDANSETRON HCL 4 MG/2ML IJ SOLN
INTRAMUSCULAR | Status: DC | PRN
  Administered 2018-10-19: 4 mg via INTRAVENOUS

## 2018-10-19 MED ORDER — FENTANYL CITRATE (PF) 100 MCG/2ML IJ SOLN
INTRAMUSCULAR | Status: DC | PRN
  Administered 2018-10-19: 50 ug via INTRAVENOUS

## 2018-10-19 MED ORDER — PROPOFOL 1000 MG/100ML IV EMUL
INTRAVENOUS | Status: AC
  Filled 2018-10-19: qty 200

## 2018-10-19 MED ORDER — LACTATED RINGERS IV SOLN
INTRAVENOUS | Status: DC
  Administered 2018-10-19 – 2018-10-20 (×2): via INTRAVENOUS

## 2018-10-19 MED ORDER — FENTANYL CITRATE (PF) 250 MCG/5ML IJ SOLN
INTRAMUSCULAR | Status: AC
  Filled 2018-10-19: qty 5

## 2018-10-19 MED ORDER — ONDANSETRON HCL 4 MG/2ML IJ SOLN: 4.0000 mg | Freq: Once | INTRAMUSCULAR | Status: DC | PRN

## 2018-10-19 MED ORDER — PROPOFOL 10 MG/ML IV BOLUS
INTRAVENOUS | Status: DC | PRN
  Administered 2018-10-19: 80 mg via INTRAVENOUS

## 2018-10-19 MED ORDER — FENTANYL CITRATE (PF) 100 MCG/2ML IJ SOLN
INTRAMUSCULAR | Status: AC
  Administered 2018-10-19: 25 ug via INTRAVENOUS
  Filled 2018-10-19: qty 2

## 2018-10-19 MED ORDER — PHENYLEPHRINE HCL-NACL 10-0.9 MG/250ML-% IV SOLN
INTRAVENOUS | Status: AC
  Filled 2018-10-19: qty 250

## 2018-10-19 MED ORDER — PROPOFOL 10 MG/ML IV BOLUS
INTRAVENOUS | Status: AC
  Filled 2018-10-19: qty 20

## 2018-10-19 MED ORDER — ENSURE ENLIVE PO LIQD
237.0000 mL | Freq: Two times a day (BID) | ORAL | Status: DC
  Administered 2018-10-20 – 2018-10-24 (×5): 237 mL via ORAL

## 2018-10-19 MED ORDER — OXYCODONE HCL 5 MG PO TABS: 5.0000 mg | ORAL_TABLET | Freq: Once | ORAL | Status: DC | PRN

## 2018-10-19 MED ORDER — LIDOCAINE 2% (20 MG/ML) 5 ML SYRINGE
INTRAMUSCULAR | Status: DC | PRN
  Administered 2018-10-19: 40 mg via INTRAVENOUS

## 2018-10-19 MED ORDER — SODIUM CHLORIDE 0.9 % IV SOLN
INTRAVENOUS | Status: DC | PRN
  Administered 2018-10-19: 25 ug/min via INTRAVENOUS
  Administered 2018-10-19: 50 ug/min via INTRAVENOUS

## 2018-10-19 MED ORDER — SODIUM CHLORIDE 0.9 % IV SOLN
0.0125 ug/kg/min | INTRAVENOUS | Status: DC
  Filled 2018-10-19: qty 2000

## 2018-10-19 MED ORDER — ROCURONIUM BROMIDE 10 MG/ML (PF) SYRINGE
PREFILLED_SYRINGE | INTRAVENOUS | Status: AC
  Filled 2018-10-19: qty 10

## 2018-10-19 MED ORDER — OXYCODONE HCL 5 MG/5ML PO SOLN: 5.0000 mg | Freq: Once | ORAL | Status: DC | PRN

## 2018-10-19 MED ORDER — FENTANYL CITRATE (PF) 100 MCG/2ML IJ SOLN
25.0000 ug | INTRAMUSCULAR | Status: DC | PRN
  Administered 2018-10-19 (×2): 25 ug via INTRAVENOUS

## 2018-10-19 MED ORDER — DEXAMETHASONE SODIUM PHOSPHATE 10 MG/ML IJ SOLN
INTRAMUSCULAR | Status: DC | PRN
  Administered 2018-10-19: 5 mg via INTRAVENOUS

## 2018-10-19 MED ORDER — ROCURONIUM BROMIDE 10 MG/ML (PF) SYRINGE
PREFILLED_SYRINGE | INTRAVENOUS | Status: DC | PRN
  Administered 2018-10-19: 50 mg via INTRAVENOUS

## 2018-10-19 MED ORDER — 0.9 % SODIUM CHLORIDE (POUR BTL) OPTIME
TOPICAL | Status: DC | PRN
  Administered 2018-10-19: 1000 mL

## 2018-10-19 SURGICAL SUPPLY — 40 items
ADH SKN CLS APL DERMABOND .7 (GAUZE/BANDAGES/DRESSINGS) ×1
APL PRP STRL LF DISP 70% ISPRP (MISCELLANEOUS) ×1
BRUSH SCRUB EZ PLAIN DRY (MISCELLANEOUS) ×3 IMPLANT
CHLORAPREP W/TINT 26 (MISCELLANEOUS) ×3 IMPLANT
COVER SURGICAL LIGHT HANDLE (MISCELLANEOUS) ×4 IMPLANT
COVER WAND RF STERILE (DRAPES) ×1 IMPLANT
DERMABOND ADVANCED (GAUZE/BANDAGES/DRESSINGS) ×2
DERMABOND ADVANCED .7 DNX12 (GAUZE/BANDAGES/DRESSINGS) ×1 IMPLANT
DRAPE C-ARMOR (DRAPES) ×2 IMPLANT
DRAPE HALF SHEET 40X57 (DRAPES) ×2 IMPLANT
DRAPE IMP U-DRAPE 54X76 (DRAPES) ×6 IMPLANT
DRAPE STERI IOBAN 125X83 (DRAPES) ×3 IMPLANT
DRAPE SURG 17X23 STRL (DRAPES) ×3 IMPLANT
DRAPE U-SHAPE 47X51 STRL (DRAPES) ×3 IMPLANT
DRSG MEPILEX BORDER 4X4 (GAUZE/BANDAGES/DRESSINGS) ×3 IMPLANT
ELECT REM PT RETURN 9FT ADLT (ELECTROSURGICAL) ×3
ELECTRODE REM PT RTRN 9FT ADLT (ELECTROSURGICAL) ×1 IMPLANT
GLOVE BIOGEL M STRL SZ7.5 (GLOVE) ×8 IMPLANT
GLOVE BIOGEL PI IND STRL 8 (GLOVE) ×1 IMPLANT
GLOVE BIOGEL PI INDICATOR 8 (GLOVE) ×2
GOWN STRL REUS W/ TWL LRG LVL3 (GOWN DISPOSABLE) ×2 IMPLANT
GOWN STRL REUS W/ TWL XL LVL3 (GOWN DISPOSABLE) ×1 IMPLANT
GOWN STRL REUS W/TWL LRG LVL3 (GOWN DISPOSABLE) ×6
GOWN STRL REUS W/TWL XL LVL3 (GOWN DISPOSABLE)
GUIDEWIRE THREADED 2.8 (WIRE) ×4 IMPLANT
KIT BASIN OR (CUSTOM PROCEDURE TRAY) ×3 IMPLANT
KIT TURNOVER KIT B (KITS) ×3 IMPLANT
MANIFOLD NEPTUNE II (INSTRUMENTS) ×1 IMPLANT
NS IRRIG 1000ML POUR BTL (IV SOLUTION) ×3 IMPLANT
PACK GENERAL/GYN (CUSTOM PROCEDURE TRAY) ×3 IMPLANT
PAD ARMBOARD 7.5X6 YLW CONV (MISCELLANEOUS) ×6 IMPLANT
SCREW CANN 16 THRD/100 7.3 (Screw) ×4 IMPLANT
SCREW CANN 16 THRD/90 7.3 (Screw) ×2 IMPLANT
STAPLER VISISTAT 35W (STAPLE) ×3 IMPLANT
SUT MNCRL AB 3-0 PS2 18 (SUTURE) ×3 IMPLANT
SUT VIC AB 2-0 CT1 27 (SUTURE) ×3
SUT VIC AB 2-0 CT1 TAPERPNT 27 (SUTURE) ×1 IMPLANT
TOWEL GREEN STERILE (TOWEL DISPOSABLE) ×6 IMPLANT
TOWEL GREEN STERILE FF (TOWEL DISPOSABLE) ×3 IMPLANT
WATER STERILE IRR 1000ML POUR (IV SOLUTION) ×3 IMPLANT

## 2018-10-19 NOTE — Anesthesia Procedure Notes (Signed)
Procedure Name: Intubation Date/Time: 10/19/2018 2:31 PM Performed by: Candis Shine, CRNA Pre-anesthesia Checklist: Patient identified, Emergency Drugs available, Suction available and Patient being monitored Patient Re-evaluated:Patient Re-evaluated prior to induction Oxygen Delivery Method: Circle System Utilized Preoxygenation: Pre-oxygenation with 100% oxygen Induction Type: IV induction Ventilation: Mask ventilation without difficulty and Oral airway inserted - appropriate to patient size Laryngoscope Size: Sabra Heck and 2 Grade View: Grade II Tube type: Oral Tube size: 7.0 mm Number of attempts: 2 Airway Equipment and Method: Stylet and Oral airway Placement Confirmation: ETT inserted through vocal cords under direct vision,  positive ETCO2 and breath sounds checked- equal and bilateral Secured at: 21 cm Tube secured with: Tape Dental Injury: Injury to lip  Difficulty Due To: Difficulty was unanticipated and Difficult Airway- due to anterior larynx Comments: 1 attempt by CRNA with Mac 3 and esophageal intubation. 1 attempt by Dr. Linna Caprice with Sabra Heck 2, grade 2 view, successful oral intubation.

## 2018-10-19 NOTE — Progress Notes (Signed)
Initial Nutrition Assessment   RD working remotely.  DOCUMENTATION CODES:   Not applicable  INTERVENTION:  Once diet advances, provide Ensure Enlive po BID, each supplement provides 350 kcal and 20 grams of protein.  NUTRITION DIAGNOSIS:   Increased nutrient needs related to post-op healing as evidenced by estimated needs.  GOAL:   Patient will meet greater than or equal to 90% of their needs  MONITOR:   Supplement acceptance, Diet advancement, Skin, Weight trends, Labs, I & O's  REASON FOR ASSESSMENT:   Consult Assessment of nutrition requirement/status  ASSESSMENT:   85 y.o. female with past medical history significant for aortic stenosis, CHF, hypertension, breast cancer status post mastectomy, who present complaining of right hip pain after a fall. Hip x-ray revealed mildly displaced high femoral neck/subcapital fracture of the right hip.  Pt currently unavailable, in OR for cannulated hip pinning surgery. RD unable to obtain pt nutrition history at this time. Pt with no significant weight loss per weight records. RD to order nutritional supplements to aid in post op healing.  Unable to complete Nutrition-Focused physical exam at this time.   Labs and medications reviewed.   Diet Order:   Diet Order            Diet NPO time specified Except for: Sips with Meds  Diet effective midnight              EDUCATION NEEDS:   Not appropriate for education at this time  Skin:  Skin Assessment: Reviewed RN Assessment  Last BM:  7/28  Height:   Ht Readings from Last 1 Encounters:  10/10/18 5\' 1"  (1.549 m)    Weight:   Wt Readings from Last 1 Encounters:  10/10/18 54.7 kg    Ideal Body Weight:  47.7 kg  BMI:  There is no height or weight on file to calculate BMI.  Estimated Nutritional Needs:   Kcal:  1400-1600  Protein:  65-75 grams  Fluid:  >/= 1.5 L/day    Corrin Parker, MS, RD, LDN Pager # [phone] After hours/ weekend pager #  [phone]

## 2018-10-19 NOTE — Brief Op Note (Signed)
10/18/2018 - 10/19/2018  3:17 PM  PATIENT:  Glenda [name]  85 y.o. female  PRE-OPERATIVE DIAGNOSIS:  Hip Fracture  POST-OPERATIVE DIAGNOSIS:  Hip Fracture  PROCEDURE:  Procedure(s): Right percutaneous hip pinning (Right)  SURGEON:  Surgeon(s) and Role:    Erle Crocker, MD - Primary  PHYSICIAN ASSISTANT:   ASSISTANTS: none   ANESTHESIA:   general  EBL:  5 mL   BLOOD ADMINISTERED:none  DRAINS: none   LOCAL MEDICATIONS USED:  NONE  SPECIMEN:  No Specimen  DISPOSITION OF SPECIMEN:  N/A  COUNTS:  YES  TOURNIQUET:  * No tourniquets in log *  DICTATION: .Dragon Dictation  PLAN OF CARE: Admit to inpatient   PATIENT DISPOSITION:  PACU - hemodynamically stable.   Delay start of Pharmacological VTE agent (>24hrs) due to surgical blood loss or risk of bleeding: no

## 2018-10-19 NOTE — Transfer of Care (Signed)
Immediate Anesthesia Transfer of Care Note  Patient: Glenda [name]  Procedure(s) Performed: Right percutaneous hip pinning (Right )  Patient Location: PACU  Anesthesia Type:General  Level of Consciousness: awake, alert  and oriented  Airway & Oxygen Therapy: Patient Spontanous Breathing and Patient connected to face mask oxygen  Post-op Assessment: Report given to RN and Post -op Vital signs reviewed and stable  Post vital signs: Reviewed and stable  Last Vitals:  Vitals Value Taken Time  BP 159/80 10/19/18 1538  Temp    Pulse 77 10/19/18 1540  Resp 20 10/19/18 1540  SpO2 94 % 10/19/18 1540  Vitals shown include unvalidated device data.  Last Pain:  Vitals:   10/19/18 0900  TempSrc:   PainSc: 0-No pain      Patients Stated Pain Goal: 4 (44/45/84 8350)  Complications: No apparent anesthesia complications

## 2018-10-19 NOTE — Progress Notes (Signed)
Progress Note    Glenda [name]  LZJ:[id number] DOB: 11-24-1924  DOA: 10/18/2018 PCP: Marin Olp, MD    Brief Narrative:   Chief complaint: Follow-up right hip fracture.  Medical records reviewed and are as summarized below:  Glenda [name] is an 85 y.o. female with a PMH of aortic stenosis, atrial flutter status post cardioversion 08/20/2016, on chronic Eliquis, hypertension, breast cancer status post mastectomy who was admitted 10/18/2018 for treatment of a right hip fracture status post fall.  Assessment/Plan:   Principal Problem:   Right hip fracture ([place]) Patient suffered from a mechanical fall.  Plain films personally reviewed.  Mildly displaced femoral neck/subcapital fracture with slight impaction/external rotation noted.  Cardiology consulted for preoperative clearance given her multiple cardiac comorbidities including atrial flutter, diastolic heart failure, and aortic stenosis.  Orthopedic surgery also consulted with plans to proceed with closed reduction and percutaneous pinning of the right hip if cleared by cardiology.  Would benefit from osteoporosis work-up.  Continue vitamin D supplementation.    Active Problems:   Essential hypertension Continue Norvasc.  Cozaar on hold pending surgery to avoid risk of AKI.    Chronic diastolic HF (heart failure) ([facility]) Continue Lasix.  Monitor fluid volume status closely.  Monitor  renal function closely.    Atrial flutter ([place]) Twelve-lead EKG personally reviewed.  Sinus rhythm at 62 bpm with nonspecific T wave abnormalities appreciated.  Continue Eliquis with no need to hold preoperatively per orthopedics.    Malignant neoplasm of upper-outer quadrant of left breast in female, estrogen receptor positive ([place])  Follows with Dr. Sonny Dandy.  Continue Arimidex.    Chronic narcotic use/Chronic pain syndrome Continue pain management.  Monitor bowel function.    Hyponatremia Mild.  Likely from Lasix use.  Will monitor.  There is no height or weight on file to calculate BMI.   Family Communication/Anticipated D/C date and plan/Code Status   DVT prophylaxis: Eliquis ordered. Code Status: DNR Family Communication: Message left for son. Disposition Plan: Unclear.  Pending PT/OT evaluations.  Remains inpatient appropriate.   Medical Consultants:    Orthopedic Surgery   Anti-Infectives:    None  Subjective:   Glenda [name] reports significant right hip/foot pain and nausea but no frank vomiting.  Denies chest pain and shortness of breath today.  Objective:    Vitals:   10/18/18 2223 10/19/18 0015 10/19/18 0527 10/19/18 0803  BP:  (!) 124/58 (!) 127/58 (!) 124/51  Pulse: 75 63 (!) 59 66  Resp: 19 20 16 16   Temp:  99 F (37.2 C) 97.8 F (36.6 C) 99.4 F (37.4 C)  TempSrc:  Oral  Oral  SpO2: 91% 92% 94% 93%   No intake or output data in the 24 hours ending 10/19/18 0847 There were no vitals filed for this visit.  Exam: General: No acute distress.  Frail elderly female. Cardiovascular: Heart sounds show a regular rate, and rhythm. No gallops or rubs.  Grade 3/6 systolic murmur. No JVD. Lungs: Clear to auscultation bilaterally with good air movement. No rales, rhonchi or wheezes. Abdomen: Soft, nontender, nondistended with normal active bowel sounds. No masses. No hepatosplenomegaly. Neurological: Alert and oriented 3. Moves upper extremities with equal strength.  Pain precludes assessing lower extremity strength at this time.  Cranial nerves II through XII grossly intact. Skin: Warm and dry. No rashes or lesions. Extremities: No clubbing or cyanosis. No edema. Pedal pulses 2+. Psychiatric: Mood and affect are mildly anxious.  Insight and judgment are  fair.   Data Reviewed:   I have personally reviewed following labs and imaging studies:  Labs: Labs show the following:   Basic Metabolic Panel: Recent Labs  Lab 10/18/18 1102  NA 134*  K 3.8  CL 96*  CO2 26  GLUCOSE 123*   BUN 26*  CREATININE 0.73  CALCIUM 9.0   GFR Estimated Creatinine Clearance: 34.6 mL/min (by C-G formula based on SCr of 0.73 mg/dL).  Coagulation profile Recent Labs  Lab 10/18/18 1227  INR 1.1    CBC: Recent Labs  Lab 10/18/18 1102  WBC 9.4  HGB 13.8  HCT 42.1  MCV 90.9  PLT 189    Microbiology Recent Results (from the past 240 hour(s))  SARS Coronavirus 2 (CEPHEID - Performed in [place] hospital lab), Hosp Order     Status: None   Collection Time: 10/18/18 12:27 PM   Specimen: Nasopharyngeal Swab  Result Value Ref Range Status   SARS Coronavirus 2 NEGATIVE NEGATIVE Final    Comment: (NOTE) If result is NEGATIVE SARS-CoV-2 target nucleic acids are NOT DETECTED. The SARS-CoV-2 RNA is generally detectable in upper and lower  respiratory specimens during the acute phase of infection. The lowest  concentration of SARS-CoV-2 viral copies this assay can detect is 250  copies / mL. A negative result does not preclude SARS-CoV-2 infection  and should not be used as the sole basis for treatment or other  patient management decisions.  A negative result may occur with  improper specimen collection / handling, submission of specimen other  than nasopharyngeal swab, presence of viral mutation(s) within the  areas targeted by this assay, and inadequate number of viral copies  (<250 copies / mL). A negative result must be combined with clinical  observations, patient history, and epidemiological information. If result is POSITIVE SARS-CoV-2 target nucleic acids are DETECTED. The SARS-CoV-2 RNA is generally detectable in upper and lower  respiratory specimens dur ing the acute phase of infection.  Positive  results are indicative of active infection with SARS-CoV-2.  Clinical  correlation with patient history and other diagnostic information is  necessary to determine patient infection status.  Positive results do  not rule out bacterial infection or co-infection with  other viruses. If result is PRESUMPTIVE POSTIVE SARS-CoV-2 nucleic acids MAY BE PRESENT.   A presumptive positive result was obtained on the submitted specimen  and confirmed on repeat testing.  While 2019 novel coronavirus  (SARS-CoV-2) nucleic acids may be present in the submitted sample  additional confirmatory testing may be necessary for epidemiological  and / or clinical management purposes  to differentiate between  SARS-CoV-2 and other Sarbecovirus currently known to infect humans.  If clinically indicated additional testing with an alternate test  methodology [id number]) is advised. The SARS-CoV-2 RNA is generally  detectable in upper and lower respiratory sp ecimens during the acute  phase of infection. The expected result is Negative. Fact Sheet for Patients:  StrictlyIdeas.no Fact Sheet for Healthcare Providers: BankingDealers.co.za This test is not yet approved or cleared by the [place] FDA and has been authorized for detection and/or diagnosis of SARS-CoV-2 by FDA under an Emergency Use Authorization (EUA).  This EUA will remain in effect (meaning this test can be used) for the duration of the COVID-19 declaration under Section 564(b)(1) of the Act, 21 U.S.C. section 360bbb-3(b)(1), unless the authorization is terminated or revoked sooner. Performed at [facility], [place] [address], [place], [place] [address]   Surgical pcr screen  Status: None   Collection Time: 10/18/18 11:59 PM   Specimen: Nasal Mucosa; Nasal Swab  Result Value Ref Range Status   MRSA, PCR NEGATIVE NEGATIVE Final   Staphylococcus aureus NEGATIVE NEGATIVE Final    Comment: (NOTE) The Xpert SA Assay (FDA approved for NASAL specimens in patients 47 years of age and older), is one component of a comprehensive surveillance program. It is not intended to diagnose infection nor to guide or monitor treatment. Performed at [facility], [place] [address], [place], [place] [address]     Procedures and diagnostic studies:  Dg Thoracic Spine 2 View  Result Date: 10/18/2018 CLINICAL DATA:  [place] this morning.  Back pain. EXAM: THORACIC SPINE 2 VIEWS COMPARISON:  None. FINDINGS: Normal alignment of the thoracic vertebral bodies. No acute thoracic compression fractures identified. The visualized posterior ribs appear intact. There is tortuosity and calcification of the thoracic aorta. The visualized lungs are clear. IMPRESSION: Normal alignment and no acute bony findings. Electronically Signed   By: Marijo Sanes M.D.   On: 10/18/2018 11:20   Dg Lumbar Spine Complete  Result Date: 10/18/2018 CLINICAL DATA:  [place].  Back pain. EXAM: LUMBAR SPINE - COMPLETE 4+ VIEW COMPARISON:  MRI lumbar spine from 2016 FINDINGS: Normal alignment of the lumbar vertebral bodies. No acute fractures identified. Interbody fusion noted at L5-S1. Right-sided sacral plasty changes are noted. IMPRESSION: No acute bony findings. Electronically Signed   By: Marijo Sanes M.D.   On: 10/18/2018 11:22   Dg Hip Unilat With Pelvis 2-3 Views Right  Result Date: 10/18/2018 CLINICAL DATA:  [place] this morning.  Right hip pain. EXAM: DG HIP (WITH OR WITHOUT PELVIS) 2-3V RIGHT COMPARISON:  None. FINDINGS: There is a mildly displaced high femoral neck/subcapital fracture with slight impaction and external rotation. Both hips are normally located. No significant degenerative changes given the patient's age. The pubic symphysis and SI joints are intact. No obvious pelvic fractures. Bone cement noted in the right sacrum from prior sacral plasty. IMPRESSION: Mildly displaced high femoral neck/subcapital fracture of the right hip. Electronically Signed   By: Marijo Sanes M.D.   On: 10/18/2018 11:18    Medications:   . amLODipine  5 mg Oral Daily  . anastrozole  1 mg Oral Daily  . apixaban  2.5 mg Oral BID  . cholecalciferol  1,000 Units Oral Daily  . docusate sodium   100 mg Oral BID  . furosemide  30 mg Oral Daily  . pantoprazole  40 mg Oral Daily  . polyethylene glycol  17 g Oral Daily  . povidone-iodine  2 application Topical Once  . sertraline  25 mg Oral Daily  . temazepam  15 mg Oral QHS   Continuous Infusions: . clindamycin (CLEOCIN) IV    . remifentanil (ULTIVA) 2 mg in 100 mL normal saline (20 mcg/mL) Optime       LOS: 1 day   Jacquelynn Cree  [facility] Pager [phone].   *Please refer to [email or web address], password [email or web address] to get updated schedule on who will round on this patient, as hospitalists switch teams weekly. If 7PM-7AM, please contact night-coverage at [email or web address], password [email or web address] for any overnight needs.  10/19/2018, 8:47 AM

## 2018-10-19 NOTE — Consult Note (Addendum)
Cardiology Consultation:   Patient ID: Glenda [name]; [id number]; 01-05-1925   Admit date: 10/18/2018 Date of Consult: 10/19/2018  Primary Care Provider: Marin Olp, MD Primary Cardiologist: Glenda Ruths, MD  Patient Profile:   Gl[facility] [name] is a 85 y.o. female with a PMH of atrial flutter on eliquis, chronic diastolic CHF, HTN, HLD, and breast cancer s/p mastectomy, who is being seen today for the evaluation of preoperative assessment at the request of [name].  History of Present Illness:   Gl[facility] [name] was in her usual state of health until  10/18/2018 when she experienced a mechanical fall while getting out of bed. She had sudden onset right hip pain. She was found to have an acute mildly displaced femoral neck/subcapital fracture of her right hip. Ortho was consulted and recommended surgical repair pending cardiology evaluation for preoperative assessment.   [facility] [name] was last seen by by cardiology at an outpatient visit with [name] on 03/21/2019. She reported occasional palpitations which was unchanged from previous and no complaints of chest pain, SOB, or syncope. She was maintaining sinus rhythm at that visit. Her amlodipine was increased to 10mg  daily. Last echocardiogram was in 2017 with EF 55-60%, mild AI, mild LAE. She had a successful cardioversion in 2018 for management of her atrial flutter. Last ischemic evaluation was a POET in 2012 which was without ischemia.   Hospital course: hypertensive on arrival which improved this morning, intermittently tachypneic to 30s and bradycardic to 50s, otherwise VSS. Labs notable for electrolytes wnl, Cr 0.73, CBC wnl, COVID 19 negative. EKG with sinus rhythm, rate 62, LVH, prolonged PR interval, no STE/D, no TWI, QTc 452. Imagine with acute right hip fracture. Ortho planning for surgical repair pending preop assessment by Cardiology.  On my exam, she reports that she lives in an assisted living facility however only  receives assistance with medication distribution. She states that she performs ADL/IADLs independently. She walks at her facility with a walker without complication. She is able to perform household duties such as light cleaning and housework. She denies SOB, palpitations, dyspnea on exertion, dizziness or syncope. She has had no orthopnea symptoms or LE swelling.    Past Medical History:  Diagnosis Date   Aortic stenosis    Echo 02/2011 showing mild AS with normal LV systolic function   Arthritis    "lower back" (10/09/2015)   Atrial flutter with rapid ventricular response ([place]) 08/19/2016   s/p successful DCCV on 08/20/16, continue eliquis   Chronic bronchitis ([place])    "off and on; several years" (10/09/2015)   Chronic diastolic CHF (congestive heart failure) ([place])    a. 12/2015 Echo: EF 55-60%, mild AI/MR, mildly dil LA.   Chronic lower back pain    Complication of anesthesia    "they had trouble waking me up after colon resection"   Confusion    Originally listed as TIA-pt denies this hx on 10/09/2015 "it was the [place] I was taking; they had thought I was having a st originally roke"   DDD (degenerative disc disease), lumbar    severe facet dz and adv DDD MRI L spine 2009   Diverticulosis    Femur fracture, right ([place]) 10/18/2018   GERD (gastroesophageal reflux disease)    Hiatal hernia    hx   Hypercholesterolemia    Hypertension    Insomnia    OA (osteoarthritis) of knee    Paroxysmal atrial flutter ([place])    a. 09/2015 s/p TEE/DCCV;  b. CHA2DS2VASc =  5-->Eliquis.   Peripheral edema    PVCs (premature ventricular contractions)    Sacral fracture ([place]) 11/06/2012   Vertigo 11/19/2012    Past Surgical History:  Procedure Laterality Date   BREAST BIOPSY Left    CARDIAC CATHETERIZATION  02/17/2001   MILD REGURGITATION. EF 60%   CARDIOVERSION N/A 10/08/2015   Procedure: CARDIOVERSION;  Surgeon: [facility] Klein, MD;  Location: [facility];  Service:  Cardiovascular;  Laterality: N/A;   CARDIOVERSION N/A 08/20/2016   Procedure: Cardioversion;  Surgeon: [facility] Lance, MD;  Location: [facility];  Service: Cardiovascular;  Laterality: N/A;   CATARACT EXTRACTION W/ INTRAOCULAR LENS  IMPLANT, BILATERAL Bilateral    CESAREAN SECTION  1954   COLECTOMY     related to "blockage"   COLONOSCOPY     EXCISIONAL HEMORRHOIDECTOMY     INGUINAL HERNIA REPAIR Bilateral    KYPHOPLASTY Right 09/11/2012   Procedure: Right Acrylic Sacroplasty;  Surgeon: [facility] Miss, MD;  [facility];  Service: Neurosurgery;  Laterality: Right;  Right  Acrylic Sacroplasty   TEE WITHOUT CARDIOVERSION N/A 10/08/2015   Procedure: TRANSESOPHAGEAL ECHOCARDIOGRAM (TEE);  Surgeon: [facility] Klein, MD;  Location: [facility] ENDOS[facility];  Service: Cardiovascular;  Laterality: N/A;   TOTAL KNEE ARTHROPLASTY Right    TOTAL MASTECTOMY Left 07/22/2017   Procedure: TOTAL MASTECTOMY;  Surgeon: [facility] Seltzer, MD;  Location: [facility];  Service: General;  Laterality: Left;   UMBILICAL HERNIA REPAIR       Home Medications:  Prior to Admission medications   Medication Sig Start Date End Date Taking? Authorizing Provider  amLODipine (NORVASC) 5 MG tablet Take 1 tablet (5 mg total) by mouth daily. 03/31/18  Yes Glenda Deforest, PA  anastrozole (ARIMIDEX) 1 MG tablet Take 1 tablet (1 mg total) by mouth daily. 10/10/18  Yes [facility] Lose, MD  apixaban (ELIQUIS) 2.5 MG TABS tablet Take 1 tablet (2.5 mg total) by mouth 2 (two) times daily. 07/24/17  Yes Glenda Pickerel, MD  benzonatate (TESSALON) 100 MG capsule Take 100 mg by mouth every 8 (eight) hours as needed for cough.   Yes [provider]  Cholecalciferol 1000 units tablet Take 1,000 Units by mouth daily.   Yes [provider]  Cranberry 425 MG CAPS Take 425 mg by mouth 2 (two) times daily.   Yes [provider]  docusate sodium (COLACE) 100 MG capsule Take 100 mg by mouth 2 (two) times daily.   Yes [provider]  furosemide (LASIX) 20 MG tablet Take 1.5 tablets (30 mg total) by mouth daily. 08/26/16  Yes Glenda Jamaica, PA-C  losartan (COZAAR) 100 MG tablet Take 1 tablet (100 mg total) by mouth daily. 07/12/17  Yes [facility] Olp, MD  Multiple Vitamins-Minerals (CEROVITE ADVANCED FORMULA PO) Take 1 tablet by mouth daily. (0800)   Yes [provider]  omeprazole (PRILOSEC) 40 MG capsule Take 40 mg by mouth daily. 08/30/17  Yes [provider]  ondansetron (ZOFRAN) 4 MG tablet Take 4 mg by mouth every 6 (six) hours as needed for nausea.   Yes [provider]  polyethylene glycol (MIRALAX / GLYCOLAX) packet Take 17 g by mouth daily. Taken  Daily Monday to Saturday.   Yes [provider]  sertraline (ZOLOFT) 25 MG tablet TAKE (1) TABLET BY MOUTH ONCE DAILY. Patient taking differently: Take 25 mg by mouth daily. TAKE (1) TABLET BY MOUTH ONCE DAILY. 09/15/18  Yes Gl[facility] Olp, MD  temazepam (RESTORIL) 15 MG capsule Take 1 capsule (  15 mg total) by mouth at bedtime. Should separate from hydrocodone by at least 6 hours. 07/06/18  Yes [facility] Olp, MD    Inpatient Medications: Scheduled Meds:  [MAR Hold] amLODipine  5 mg Oral Daily   [MAR Hold] anastrozole  1 mg Oral Daily   [MAR Hold] apixaban  2.5 mg Oral BID   [MAR Hold] cholecalciferol  1,000 Units Oral Daily   [MAR Hold] docusate sodium  100 mg Oral BID   [START ON 10/20/2018] feeding supplement (ENSURE ENLIVE)  237 mL Oral BID BM   [MAR Hold] furosemide  30 mg Oral Daily   [MAR Hold] pantoprazole  40 mg Oral Daily   [MAR Hold] polyethylene glycol  17 g Oral Daily   povidone-iodine  2 application Topical Once   [MAR Hold] sertraline  25 mg Oral Daily   [MAR Hold] temazepam  15 mg Oral QHS   Continuous Infusions:  clindamycin (CLEOCIN) IV     lactated ringers 10 mL/hr at 10/19/18 1234   remifentanil (ULTIVA) 2 mg in 100 mL normal saline (20 mcg/mL) Optime     PRN Meds: [MAR  Hold] benzonatate, [MAR Hold] HYDROcodone-acetaminophen, [MAR Hold]  morphine injection, [MAR Hold] ondansetron  Allergies:    Allergies  Allergen Reactions   Ace Inhibitors Other (See Comments)    Cough   Halcion [Triazolam]     UNSPECIFIED REACTION    Pentazocine Lactate     UNSPECIFIED REACTION    Sulfa Drugs Cross Reactors     UNSPECIFIED REACTION    Trazodone And Nefazodone Other (See Comments)    Per MAR   Amitriptyline Other (See Comments)    Hyperactivity   Avelox [Moxifloxacin Hcl In Nacl] Other (See Comments)    dizziness   Penicillins Rash    Social History:   Social History   Socioeconomic History   Marital status: Widowed    Spouse name: Not on file   Number of children: Not on file   Years of education: Not on file   Highest education level: Not on file  Occupational History   Not on file  Social Needs   Financial resource strain: Not on file   Food insecurity    Worry: Not on file    Inability: Not on file   Transportation needs    Medical: Not on file    Non-medical: Not on file  Tobacco Use   Smoking status: Never Smoker   Smokeless tobacco: Never Used  Substance and Sexual Activity   Alcohol use: No   Drug use: No   Sexual activity: Never  Lifestyle   Physical activity    Days per week: Not on file    Minutes per session: Not on file   Stress: Not on file  Relationships   Social connections    Talks on phone: Not on file    Gets together: Not on file    Attends religious service: Not on file    Active member of club or organization: Not on file    Attends meetings of clubs or organizations: Not on file    Relationship status: Not on file   Intimate partner violence    Fear of current or ex partner: Not on file    Emotionally abused: Not on file    Physically abused: Not on file    Forced sexual activity: Not on file  Other Topics Concern   Not on file  Social History Narrative   Widowed since 2010  Lives in assisted living at [facility]      Claims adjusting when in [place]- 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    All other ROS reviewed and negative.     Physical Exam/Data:   Vitals:   10/18/18 2223 10/19/18 0015 10/19/18 0527 10/19/18 0803  BP:  (!) 124/58 (!) 127/58 (!) 124/51  Pulse: 75 63 (!) 59 66  Resp: 19 20 16 16   Temp:  99 F (37.2 C) 97.8 F (36.6 C) 99.4 F (37.4 C)  TempSrc:  Oral  Oral  SpO2: 91% 92% 94% 93%    Intake/Output Summary (Last 24 hours) at 10/19/2018 1251 Last data filed at 10/19/2018 0900 Gross per 24 hour  Intake 0 ml  Output --  Net 0 ml   There were no vitals filed for this visit. There is no height or weight on file to calculate BMI.   General: Elderly, frail, NAD Skin: Warm, dry, intact  Head: Normocephalic, atraumatic, sclera non-icteric, no xanthomas, clear, moist mucus membranes. Neck: Negative for carotid bruits. No JVD Lungs:Clear to ausculation bilaterally. No wheezes, rales, or rhonchi. Breathing is unlabored. Cardiovascular: RRR with S1 S2. + murmur Abdomen: Soft, non-tender, non-distended. No obvious abdominal masses. Extremities: No edema. No clubbing or cyanosis. DP/PT pulses 2+ bilaterally Neuro: Alert and oriented. No focal deficits. No facial asymmetry. MAE spontaneously. Psych: Responds to questions appropriately with normal affect.    EKG:  The EKG was personally reviewed and demonstrates:  sinus rhythm, rate 62,  LVH, prolonged PR interval, no STE/D, no TWI, QTc 452  Relevant CV Studies: Echocardiogram 2017: Study Conclusions  - Left ventricle: The cavity size was normal. There was mild   concentric hypertrophy. Systolic function was normal. The   estimated ejection fraction was in the range of 55% to 60%. - Aortic valve: There was mild regurgitation. - Mitral valve: There was mild regurgitation. - Left atrium: The atrium was mildly dilated. - Atrial septum: No defect or patent foramen ovale was identified.  Laboratory Data:  Chemistry Recent Labs  Lab 10/18/18 1102  NA 134*  K 3.8  CL 96*  CO2 26  GLUCOSE 123*  BUN 26*  CREATININE 0.73  CALCIUM 9.0  GFRNONAA >60  GFRAA >60  ANIONGAP 12    No results for input(s): PROT, ALBUMIN, AST, ALT, ALKPHOS, BILITOT in the last 168 hours. Hematology Recent Labs  Lab 10/18/18 1102  WBC 9.4  RBC 4.63  HGB 13.8  HCT 42.1  MCV 90.9  MCH 29.8  MCHC 32.8  RDW 13.2  PLT 189   Cardiac EnzymesNo results for input(s): TROPONINI in the last 168 hours. No results for input(s): TROPIPOC in the last 168 hours.  BNPNo results for input(s): BNP, PROBNP in the last  168 hours.  DDimer No results for input(s): DDIMER in the last 168 hours.  Radiology/Studies:  Dg Thoracic Spine 2 View  Result Date: 10/18/2018 CLINICAL DATA:  [place] this morning.  Back pain. EXAM: THORACIC SPINE 2 VIEWS COMPARISON:  None. FINDINGS: Normal alignment of the thoracic vertebral bodies. No acute thoracic compression fractures identified. The visualized posterior ribs appear intact. There is tortuosity and calcification of the thoracic aorta. The visualized lungs are clear. IMPRESSION: Normal alignment and no acute bony findings. Electronically Signed   By: Marijo Sanes M.D.   On: 10/18/2018 11:20   Dg Lumbar Spine Complete  Result Date: 10/18/2018 CLINICAL DATA:  [place].  Back pain. EXAM: LUMBAR SPINE - COMPLETE 4+ VIEW COMPARISON:  MRI lumbar spine from 2016 FINDINGS:  Normal alignment of the lumbar vertebral bodies. No acute fractures identified. Interbody fusion noted at L5-S1. Right-sided sacral plasty changes are noted. IMPRESSION: No acute bony findings. Electronically Signed   By: Marijo Sanes M.D.   On: 10/18/2018 11:22   Dg Hip Unilat With Pelvis 2-3 Views Right  Result Date: 10/18/2018 CLINICAL DATA:  [place] this morning.  Right hip pain. EXAM: DG HIP (WITH OR WITHOUT PELVIS) 2-3V RIGHT COMPARISON:  None. FINDINGS: There is a mildly displaced high femoral neck/subcapital fracture with slight impaction and external rotation. Both hips are normally located. No significant degenerative changes given the patient's age. The pubic symphysis and SI joints are intact. No obvious pelvic fractures. Bone cement noted in the right sacrum from prior sacral plasty. IMPRESSION: Mildly displaced high femoral neck/subcapital fracture of the right hip. Electronically Signed   By: Marijo Sanes M.D.   On: 10/18/2018 11:18    Assessment and Plan:   1. Preoperative assessment: patient presented after mechanical fall with subsequent right hip fracture. She has no known heart disease history. She has chronic diastolic CHF but no volume overload complaints and appears euvolemic on exam. No history of diabetes, stroke, or CKD. She can easily complete 4 METs without anginal complaints. Last echo with EF 55-60%, with mild AI. Last ischemic evaluation was a POET which did not show ischemia. - Based on the revised cardiac risk index, patient has a score of 1 for CHF history with a 6% risk of adverse cardiac events in the perioperative setting.  - Patient is deemed an acceptable risk for surgery without further cardiac work-up.  2. Chronic diastolic CHF: no volume overload complaints and she appears euvolemic on exam - Continue to monitor volume status closely in the perioperative setting - Continue po lasix  3. Paroxysmal atrial flutter: s/p DCCV in 2018. EKG with sinus rhythm this  admission. Eliquis on hold for surgery. Not on any rate controlling medications - Resume eliquis when cleared to do so by surgery for CHA2DS2-VASc Score of 5 (CHF, HTN, Female, Age >75)  4. HTN: BP stable - Continue amlodipine   For questions or updates, please contact [place] Please consult [email or web address] for contact info under Cardiology/STEMI.   Celso Sickle, PA-C  10/19/2018 12:51 PM [phone]  Attending Note:   The patient was seen and examined.  Agree with assessment and plan as noted above.  Changes made to the above note as needed.  Patient seen and independently examined with Kathyrn Drown, NP .   We discussed all aspects of the encounter. I agree with the assessment and plan as stated above.  Pt fell in the middle of the night and was admitted with hip fracture .   1.  Pre op evaluation:   Pt has a hx of AI, MR, atrial flutter.  She has been very stable and lives independently .  EF is normal.   Volume status looks normal .  Able to achieve > 4 mets.  2.  chronic diastolic CHF:  Stable   3.   Paroxysmal atrial flutter:    She is at low risk for her hip repair.     I have spent a total of 40 minutes with patient reviewing hospital  notes , telemetry, EKGs, labs and examining patient as well as establishing an assessment and plan that was discussed with the patient. > 50% of time was spent in direct patient care.    Thayer Headings, Brooke Bonito., MD, [facility] 10/19/2018, 2:19 PM [address]  [place] Pager [phone]

## 2018-10-19 NOTE — Anesthesia Postprocedure Evaluation (Signed)
Anesthesia Post Note  Patient: [facility] [name]  Procedure(s) Performed: Right percutaneous hip pinning (Right )     Patient location during evaluation: PACU Anesthesia Type: General Level of consciousness: awake and alert Pain management: pain level controlled Vital Signs Assessment: post-procedure vital signs reviewed and stable Respiratory status: spontaneous breathing, nonlabored ventilation, respiratory function stable and patient connected to nasal cannula oxygen Cardiovascular status: blood pressure returned to baseline and stable Postop Assessment: no apparent nausea or vomiting Anesthetic complications: no    Last Vitals:  Vitals:   10/19/18 1656 10/19/18 1701  BP: (!) 156/77 (!) 156/77  Pulse: 72 69  Resp: 18   Temp: 36.7 C 36.7 C  SpO2: 99% 99%    Last Pain:  Vitals:   10/19/18 1701  TempSrc: Oral  PainSc:                  [name]

## 2018-10-19 NOTE — Consult Note (Signed)
Reason for Consult: Right valgus impacted femoral neck fracture Referring Physician: Emergency department  Glenda [name] is an 85 y.o. female.  HPI: Patient fell yesterday onto her right hip while trying to use the restroom.  She had immediate pain in her right hip and was taken to the emergency department for evaluation.  Patient has a history of baseline mild dementia and heart disease.  She takes Eliquis at baseline.  In the emergency department she was diagnosed with a valgus impacted femoral neck fracture.  Orthopedics was consulted.  On my evaluation patient complains of pain in the right hip only.  She has no other areas of pain.  She is oriented to person place and time.  Patient denies any numbness or tingling in the right lower extremity.  Denies any recent fevers or chills.  Past Medical History:  Diagnosis Date  . Aortic stenosis    Echo 02/2011 showing mild AS with normal LV systolic function  . Arthritis    "lower back" (10/09/2015)  . Atrial flutter with rapid ventricular response ([place]) 08/19/2016   s/p successful DCCV on 08/20/16, continue eliquis  . Chronic bronchitis ([place])    "off and on; several years" (10/09/2015)  . Chronic diastolic CHF (congestive heart failure) ([place])    a. 12/2015 Echo: EF 55-60%, mild AI/MR, mildly dil LA.  Marland Kitchen Chronic lower back pain   . Complication of anesthesia    "they had trouble waking me up after colon resection"  . Confusion    Originally listed as TIA-pt denies this hx on 10/09/2015 "it was the [place] I was taking; they had thought I was having a st originally roke"  . DDD (degenerative disc disease), lumbar    severe facet dz and adv DDD MRI L spine 2009  . Diverticulosis   . Femur fracture, right ([place]) 10/18/2018  . GERD (gastroesophageal reflux disease)   . Hiatal hernia    hx  . Hypercholesterolemia   . Hypertension   . Insomnia   . OA (osteoarthritis) of knee   . Paroxysmal atrial flutter ([place])    a. 09/2015 s/p TEE/DCCV;  b.  CHA2DS2VASc = 5-->Eliquis.  . Peripheral edema   . PVCs (premature ventricular contractions)   . Sacral fracture ([place]) 11/06/2012  . Vertigo 11/19/2012    Past Surgical History:  Procedure Laterality Date  . BREAST BIOPSY Left   . CARDIAC CATHETERIZATION  02/17/2001   MILD REGURGITATION. EF 60%  . CARDIOVERSION N/A 10/08/2015   Procedure: CARDIOVERSION;  Surgeon: Sanda Klein, MD;  Location: [facility] ENDOSCOPY;  Service: Cardiovascular;  Laterality: N/A;  . CARDIOVERSION N/A 08/20/2016   Procedure: Cardioversion;  Surgeon: Evans Lance, MD;  Location: [facility] CV LAB;  Service: Cardiovascular;  Laterality: N/A;  . CATARACT EXTRACTION W/ INTRAOCULAR LENS  IMPLANT, BILATERAL Bilateral   . CESAREAN SECTION  1954  . COLECTOMY     related to "blockage"  . COLONOSCOPY    . EXCISIONAL HEMORRHOIDECTOMY    . INGUINAL HERNIA REPAIR Bilateral   . KYPHOPLASTY Right 09/11/2012   Procedure: Right Acrylic Sacroplasty;  Surgeon: Kristeen Miss, MD;  Location: [facility] NEURO ORS;  Service: Neurosurgery;  Laterality: Right;  Right  Acrylic Sacroplasty  . TEE WITHOUT CARDIOVERSION N/A 10/08/2015   Procedure: TRANSESOPHAGEAL ECHOCARDIOGRAM (TEE);  Surgeon: Sanda Klein, MD;  Location: [facility] ENDOSCOPY;  Service: Cardiovascular;  Laterality: N/A;  . TOTAL KNEE ARTHROPLASTY Right   . TOTAL MASTECTOMY Left 07/22/2017   Procedure: TOTAL MASTECTOMY;  Surgeon: Excell Seltzer, MD;  Location: [facility] OR;  Service: General;  Laterality: Left;  . UMBILICAL HERNIA REPAIR      Family History  Problem Relation Age of Onset  . Heart disease Mother   . Hypertension Father   . Other Sister        84 in 2019  . Other Sister        5 in 2019  . Heart disease Sister   . Heart attack Neg Hx   . Stroke Neg Hx     Social History:  reports that she has never smoked. She has never used smokeless tobacco. She reports that she does not drink alcohol or use drugs.  Allergies:  Allergies  Allergen Reactions  . Ace Inhibitors  Other (See Comments)    Cough  . Halcion [Triazolam]     UNSPECIFIED REACTION   . Pentazocine Lactate     UNSPECIFIED REACTION   . Sulfa Drugs Cross Reactors     UNSPECIFIED REACTION   . Trazodone And Nefazodone Other (See Comments)    Per MAR  . Amitriptyline Other (See Comments)    Hyperactivity  . Avelox [Moxifloxacin Hcl In Nacl] Other (See Comments)    dizziness  . Penicillins Rash    Medications: I have reviewed the patient's current medications.  Results for orders placed or performed during the hospital encounter of 10/18/18 (from the past 48 hour(s))  CBC     Status: None   Collection Time: 10/18/18 11:02 AM  Result Value Ref Range   WBC 9.4 4.0 - 10.5 K/uL   RBC 4.63 3.87 - 5.11 MIL/uL   Hemoglobin 13.8 12.0 - 15.0 g/dL   HCT 42.1 36.0 - 46.0 %   MCV 90.9 80.0 - 100.0 fL   MCH 29.8 26.0 - 34.0 pg   MCHC 32.8 30.0 - 36.0 g/dL   RDW 13.2 11.5 - 15.5 %   Platelets 189 150 - 400 K/uL   nRBC 0.0 0.0 - 0.2 %    Comment: Performed at [facility], [place] [address] [place], [place] [address]  Basic metabolic panel     Status: Abnormal   Collection Time: 10/18/18 11:02 AM  Result Value Ref Range   Sodium 134 (L) 135 - 145 mmol/L   Potassium 3.8 3.5 - 5.1 mmol/L   Chloride 96 (L) 98 - 111 mmol/L   CO2 26 22 - 32 mmol/L   Glucose, Bld 123 (H) 70 - 99 mg/dL   BUN 26 (H) 8 - 23 mg/dL   Creatinine, Ser 0.73 0.44 - 1.00 mg/dL   Calcium 9.0 8.9 - 10.3 mg/dL   GFR calc non Af Amer >60 >60 mL/min   GFR calc Af Amer >60 >60 mL/min   Anion gap 12 5 - 15    Comment: Performed at [facility], [place] [address] [place], [place] [address]  SARS Coronavirus 2 (CEPHEID - Performed in [facility] hospital lab), Hosp Order     Status: None   Collection Time: 10/18/18 12:27 PM   Specimen: Nasopharyngeal Swab  Result Value Ref Range   SARS Coronavirus 2 NEGATIVE NEGATIVE    Comment: (NOTE) If result is NEGATIVE SARS-CoV-2 target nucleic acids  are NOT DETECTED. The SARS-CoV-2 RNA is generally detectable in upper and lower  respiratory specimens during the acute phase of infection. The lowest  concentration of SARS-CoV-2 viral copies this assay can detect is 250  copies / mL. A negative result does not preclude SARS-CoV-2 infection  and should  not be used as the sole basis for treatment or other  patient management decisions.  A negative result may occur with  improper specimen collection / handling, submission of specimen other  than nasopharyngeal swab, presence of viral mutation(s) within the  areas targeted by this assay, and inadequate number of viral copies  (<250 copies / mL). A negative result must be combined with clinical  observations, patient history, and epidemiological information. If result is POSITIVE SARS-CoV-2 target nucleic acids are DETECTED. The SARS-CoV-2 RNA is generally detectable in upper and lower  respiratory specimens dur ing the acute phase of infection.  Positive  results are indicative of active infection with SARS-CoV-2.  Clinical  correlation with patient history and other diagnostic information is  necessary to determine patient infection status.  Positive results do  not rule out bacterial infection or co-infection with other viruses. If result is PRESUMPTIVE POSTIVE SARS-CoV-2 nucleic acids MAY BE PRESENT.   A presumptive positive result was obtained on the submitted specimen  and confirmed on repeat testing.  While 2019 novel coronavirus  (SARS-CoV-2) nucleic acids may be present in the submitted sample  additional confirmatory testing may be necessary for epidemiological  and / or clinical management purposes  to differentiate between  SARS-CoV-2 and other Sarbecovirus currently known to infect humans.  If clinically indicated additional testing with an alternate test  methodology [phone]) is advised. The SARS-CoV-2 RNA is generally  detectable in upper and lower respiratory sp ecimens  during the acute  phase of infection. The expected result is Negative. Fact Sheet for Patients:  StrictlyIdeas.no Fact Sheet for Healthcare Providers: BankingDealers.co.za This test is not yet approved or cleared by the [place] FDA and has been authorized for detection and/or diagnosis of SARS-CoV-2 by FDA under an Emergency Use Authorization (EUA).  This EUA will remain in effect (meaning this test can be used) for the duration of the COVID-19 declaration under Section 564(b)(1) of the Act, 21 U.S.C. section 360bbb-3(b)(1), unless the authorization is terminated or revoked sooner. Performed at [facility], [place] [address], [place], [place] [address]   Protime-INR     Status: None   Collection Time: 10/18/18 12:27 PM  Result Value Ref Range   Prothrombin Time 13.9 11.4 - 15.2 seconds   INR 1.1 0.8 - 1.2    Comment: (NOTE) INR goal varies based on device and disease states. Performed at [facility], [place] [address] [place], [place] [address]   Type and screen [place]     Status: None   Collection Time: 10/18/18 12:27 PM  Result Value Ref Range   ABO/RH(D) O NEG    Antibody Screen NEG    Sample Expiration      10/21/2018,2359 Performed at [facility], [place] [address], [place], [place] [address]   Surgical pcr screen     Status: None   Collection Time: 10/18/18 11:59 PM   Specimen: Nasal Mucosa; Nasal Swab  Result Value Ref Range   MRSA, PCR NEGATIVE NEGATIVE   Staphylococcus aureus NEGATIVE NEGATIVE    Comment: (NOTE) The Xpert SA Assay (FDA approved for NASAL specimens in patients 72 years of age and older), is one component of a comprehensive surveillance program. It is not intended to diagnose infection nor to guide or monitor treatment. Performed at [facility], [place] [address], [place], [place] [address]   Type and screen [place]     Status: None  Collection Time: 10/19/18 10:35 AM  Result Value Ref Range   ABO/RH(D) O NEG    Antibody Screen NEG    Sample Expiration      10/22/2018,2359 Performed at [facility], [place] [address] [place], [place] [address]     Dg Thoracic Spine 2 View  Result Date: 10/18/2018 CLINICAL DATA:  [place] this morning.  Back pain. EXAM: THORACIC SPINE 2 VIEWS COMPARISON:  None. FINDINGS: Normal alignment of the thoracic vertebral bodies. No acute thoracic compression fractures identified. The visualized posterior ribs appear intact. There is tortuosity and calcification of the thoracic aorta. The visualized lungs are clear. IMPRESSION: Normal alignment and no acute bony findings. Electronically Signed   By: Marijo Sanes M.D.   On: 10/18/2018 11:20   Dg Lumbar Spine Complete  Result Date: 10/18/2018 CLINICAL DATA:  [place].  Back pain. EXAM: LUMBAR SPINE - COMPLETE 4+ VIEW COMPARISON:  MRI lumbar spine from 2016 FINDINGS: Normal alignment of the lumbar vertebral bodies. No acute fractures identified. Interbody fusion noted at L5-S1. Right-sided sacral plasty changes are noted. IMPRESSION: No acute bony findings. Electronically Signed   By: Marijo Sanes M.D.   On: 10/18/2018 11:22   Dg Hip Unilat With Pelvis 2-3 Views Right  Result Date: 10/18/2018 CLINICAL DATA:  [place] this morning.  Right hip pain. EXAM: DG HIP (WITH OR WITHOUT PELVIS) 2-3V RIGHT COMPARISON:  None. FINDINGS: There is a mildly displaced high femoral neck/subcapital fracture with slight impaction and external rotation. Both hips are normally located. No significant degenerative changes given the patient's age. The pubic symphysis and SI joints are intact. No obvious pelvic fractures. Bone cement noted in the right sacrum from prior sacral plasty. IMPRESSION: Mildly displaced high femoral neck/subcapital fracture of the right hip. Electronically Signed   By: Marijo Sanes M.D.   On: 10/18/2018 11:18     Review of Systems  Constitutional: Negative.   HENT: Negative.   Eyes: Negative.   Respiratory: Negative.   Cardiovascular: Negative.   Gastrointestinal: Negative.   Musculoskeletal:       Right hip pain  Skin: Negative.   Neurological: Negative.   Psychiatric/Behavioral: Negative.    Blood pressure (!) 124/51, pulse 66, temperature 99.4 F (37.4 C), temperature source Oral, resp. rate 16, SpO2 93 %. Physical Exam  Constitutional: She appears well-developed.  HENT:  Head: Normocephalic.  Eyes: Conjunctivae are normal.  Neck: Neck supple.  Cardiovascular: Normal rate.  Respiratory: Effort normal.  GI: Soft.  Musculoskeletal:     Comments: Right lower extremity demonstrates tenderness palpation of the hip.  Did not assess hip logroll due to fracture.  No tenderness palpation distal about the thigh or knee.  No leg or ankle tenderness.  Able to dorsiflex and plantarflex the ankle.  Endorses sensation light touch on the dorsal plantar foot.  Foot is warm and well-perfused.  No evidence of bilateral upper extremity injury. No evidence of left lower extremity injury.  Neurological: She is alert.  Skin: Skin is warm.  Psychiatric: She has a normal mood and affect.    Assessment/Plan: Patient has a right valgus impacted femoral neck fracture.  She is indicated for closed reduction percutaneous pinning given her baseline ambulatory status.  She does have mild dementia and known heart disease.  She will be admitted to the hospitalist team per protocol and they have recommended cardiology consultation.  Given that her surgery is a percutaneous surgery she does not need to stop Eliquis from my standpoint.  We will  plan for operative fixation of the right hip once medically stable for surgery.  She does have an active DNR order.  I discussed the plan with the patient and her son.  We discussed the risk, benefits alternatives of surgery which include but are not limited to wound healing  complications, infection, nonunion, malunion, need for further surgery and damage surrounding structures.  We also discussed the possibility of displacement of the femoral head with transport and movement and the need to abort percutaneous pinning to a more invasive form of surgery such as hip hemiarthroplasty.  We discussed the perioperative and anesthetic risk which include death.  We also discussed the postoperative rehab expectations which she understands.  After weighing the risks and benefits she opted to proceed with surgery.  Erle Crocker 10/19/2018, 12:01 PM

## 2018-10-20 ENCOUNTER — Encounter (HOSPITAL_COMMUNITY): Payer: Self-pay | Admitting: Orthopaedic Surgery

## 2018-10-20 DIAGNOSIS — C50412 Malignant neoplasm of upper-outer quadrant of left female breast: Secondary | ICD-10-CM

## 2018-10-20 DIAGNOSIS — E871 Hypo-osmolality and hyponatremia: Secondary | ICD-10-CM

## 2018-10-20 DIAGNOSIS — Z170 Estrogen receptor positive status [ER+]: Secondary | ICD-10-CM

## 2018-10-20 LAB — BASIC METABOLIC PANEL
Anion gap: 10 (ref 5–15)
BUN: 37 mg/dL — ABNORMAL HIGH (ref 8–23)
CO2: 26 mmol/L (ref 22–32)
Calcium: 8.4 mg/dL — ABNORMAL LOW (ref 8.9–10.3)
Chloride: 99 mmol/L (ref 98–111)
Creatinine, Ser: 1.05 mg/dL — ABNORMAL HIGH (ref 0.44–1.00)
GFR calc Af Amer: 54 mL/min — ABNORMAL LOW (ref >60)
GFR calc non Af Amer: 46 mL/min — ABNORMAL LOW (ref >60)
Glucose, Bld: 143 mg/dL — ABNORMAL HIGH (ref 70–99)
Potassium: 4 mmol/L (ref 3.5–5.1)
Sodium: 135 mmol/L (ref 135–145)

## 2018-10-20 LAB — CBC
HCT: 37.8 % (ref 36.0–46.0)
Hemoglobin: 12.3 g/dL (ref 12.0–15.0)
MCH: 29.6 pg (ref 26.0–34.0)
MCHC: 32.5 g/dL (ref 30.0–36.0)
MCV: 91.1 fL (ref 80.0–100.0)
Platelets: 176 10*3/uL (ref 150–400)
RBC: 4.15 MIL/uL (ref 3.87–5.11)
RDW: 13 % (ref 11.5–15.5)
WBC: 7.3 10*3/uL (ref 4.0–10.5)
nRBC: 0 % (ref 0.0–0.2)

## 2018-10-20 MED ORDER — PHENYLEPHRINE HCL-NACL 10-0.9 MG/250ML-% IV SOLN
INTRAVENOUS | Status: AC
  Filled 2018-10-20: qty 500

## 2018-10-20 NOTE — Progress Notes (Signed)
    Pt is doing well Continue meds Is back on Eliquis 2.5 BID\  Resume home meds at DC.  [facility] will sign off.   Medication Recommendations:   Other recommendations (labs, testing, etc):   Follow up as an outpatient:  With Dr. Elta Guadeloupe, MD  10/20/2018 5:32 PM    [place],  [place], [place]  [address] Pager [phone] Phone: [phone]; Fax: [phone]

## 2018-10-20 NOTE — Progress Notes (Signed)
RN called and updated pt's son, [name], via cell phone [phone]. All questions answered to satisfaction. Will continue to monitor.

## 2018-10-20 NOTE — Progress Notes (Signed)
Glenda [name] is a 85 y.o. female   Orthopaedic diagnosis: Right valgus impacted femoral neck fracture status post CRPP on 10/19/2018  Subjective: Patient is resting comfortably.  She continues to have pain in her right hip.  She states is hard to roll her leg and move her right lower extremity due to the discomfort.  She denies any shortness of breath.  She has not worked with physical therapy yet.  She is inquiring about discharge.  Objectyive: Vitals:   10/20/18 0348 10/20/18 0400  BP: 136/60   Pulse: (!) 53 72  Resp: 14   Temp: 98.2 F (36.8 C)   SpO2: 92%      Exam: Awake and alert Respirations even and unlabored No acute distress  Right hip region dressing in place with slight shadowing.  Some bruising noted posterior to the dressing.  Discomfort with hip logroll.  Patient endorses sensation light touch distally.  She is able to dorsiflex and plantarflex the ankle.  Foot is warm and well-perfused.  Assessment: Postop day 1 status post closed reduction percutaneous pinning of right valgus impacted femoral neck fracture   Plan: She is doing well.  She will work with physical therapy as she is weightbearing as tolerated.  Finished perioperative antibiotics Will await physical therapy recommendations for discharge Okay to continue Eliquis for clot prevention Reinforce dressing as needed She will follow-up with me in 2 weeks for x-rays and wound check   Radene Journey, MD

## 2018-10-20 NOTE — Evaluation (Signed)
Physical Therapy Evaluation Patient Details Name: Glenda [name] MRN: [id number] DOB: 12/31/1924 Today's Date: 10/20/2018   History of Present Illness  Pt is a 85 y/o female s/p closed reduction with perc screw fixation of R femoral neck fracture after falling. PMH including but not limited to CHF, HTN, breast cancer s/p mastectomy in 2019, R TKA and R kyphoplasty in 2014.    Clinical Impression  Pt presented supine in bed with HOB elevated, awake and willing to participate in therapy session. Prior to admission, pt reported that she ambulated with use of rollator and was independent with ADLs. Pt lives at an ALF but stated that they only assist her with medication management and provide meals. At the time of evaluation, pt very limited secondary to pain and weakness. Pt required max A x2 for bed mobility and mod A x2 for transfers. Pt would continue to benefit from skilled physical therapy services at this time while admitted and after d/c to address the below listed limitations in order to improve overall safety and independence with functional mobility.     Follow Up Recommendations SNF;Other (comment)(unless her ALF can provide heavy physical assistance)    Equipment Recommendations  None recommended by PT    Recommendations for Other Services       Precautions / Restrictions Precautions Precautions: Fall Restrictions Weight Bearing Restrictions: Yes RLE Weight Bearing: Weight bearing as tolerated      Mobility  Bed Mobility Overal bed mobility: Needs Assistance Bed Mobility: Rolling;Supine to Sit Rolling: Mod assist   Supine to sit: +2 for physical assistance;Max assist     General bed mobility comments: pt able to roll bilaterally with mod A and use of bed rails for bath; pt required heavy physical assistance to achieve sitting upright at EOB; max A x2 with use of bed pads to position pt's hips; pt able to assist with bilateral UEs on bed rails  Transfers Overall  transfer level: Needs assistance Equipment used: Rolling walker (2 wheeled) Transfers: Sit to/from [employer] Transfers Sit to Stand: From elevated surface;Mod assist;+2 physical assistance Stand pivot transfers: Mod assist;+2 physical assistance       General transfer comment: increased time and effort, cueing for safe hand placement and technique, physical assistance needed for positioning of R LE prior to standing from bed, heavy assist to power into standing from EOB but pt able to initiate movement; pt able to very slowly pivot to chair towards her L side with mod A x2  Ambulation/Gait                Stairs            Wheelchair Mobility    Modified Rankin (Stroke Patients Only)       Balance Overall balance assessment: Needs assistance Sitting-balance support: Feet supported;Bilateral upper extremity supported Sitting balance-Leahy Scale: Poor Sitting balance - Comments: pt bracing with bilateral UEs but able to sit EOB with close min guard   Standing balance support: Bilateral upper extremity supported Standing balance-Leahy Scale: Poor Standing balance comment: heavy reliance on bilateral UEs                             Pertinent Vitals/Pain Pain Assessment: Faces Faces Pain Scale: Hurts whole lot Pain Location: R hip Pain Descriptors / Indicators: Grimacing;Guarding;Sore Pain Intervention(s): Monitored during session;Repositioned    Home Living Family/patient expects to be discharged to:: Assisted living  Home Equipment: [place] - 4 wheels      Prior Function Level of Independence: Independent with assistive device(s)         Comments: ambulates with rollator     Hand Dominance        Extremity/Trunk Assessment   Upper Extremity Assessment Upper Extremity Assessment: Generalized weakness    Lower Extremity Assessment Lower Extremity Assessment: Generalized weakness;RLE deficits/detail RLE  Deficits / Details: pt with decreased strength and AROM limitations secondary to post-op pain and weakness.  RLE: Unable to fully assess due to pain       Communication   Communication: No difficulties  Cognition Arousal/Alertness: Awake/alert Behavior During Therapy: WFL for tasks assessed/performed Overall Cognitive Status: Within Functional Limits for tasks assessed                                        General Comments      Exercises     Assessment/Plan    PT Assessment Patient needs continued PT services  PT Problem List Decreased strength;Decreased activity tolerance;Decreased range of motion;Decreased balance;Decreased mobility;Decreased coordination;Decreased knowledge of use of DME;Decreased safety awareness;Decreased knowledge of precautions;Pain       PT Treatment Interventions DME instruction;Gait training;Stair training;Functional mobility training;Therapeutic activities;Therapeutic exercise;Neuromuscular re-education;Balance training;Patient/family education    PT Goals (Current goals can be found in the Care Plan section)  Acute Rehab PT Goals Patient Stated Goal: return to independence PT Goal Formulation: With patient Time For Goal Achievement: 11/03/18 Potential to Achieve Goals: Good    Frequency Min 2X/week   Barriers to discharge        Co-evaluation               AM-PAC PT "6 Clicks" Mobility  Outcome Measure Help needed turning from your back to your side while in a flat bed without using bedrails?: A Lot Help needed moving from lying on your back to sitting on the side of a flat bed without using bedrails?: A Lot Help needed moving to and from a bed to a chair (including a wheelchair)?: A Lot Help needed standing up from a chair using your arms (e.g., wheelchair or bedside chair)?: A Lot Help needed to walk in hospital room?: Total Help needed climbing 3-5 steps with a railing? : Total 6 Click Score: 10    End of  Session Equipment Utilized During Treatment: Gait belt Activity Tolerance: Patient tolerated treatment well Patient left: in chair;with call bell/phone within reach;with chair alarm set Nurse Communication: Mobility status PT Visit Diagnosis: Other abnormalities of gait and mobility (R26.89);Pain Pain - Right/Left: Right Pain - part of body: Hip    Time: 0240-9735 PT Time Calculation (min) (ACUTE ONLY): 28 min   Charges:   PT Evaluation $PT Eval Moderate Complexity: 1 Mod PT Treatments $Therapeutic Activity: 8-22 mins        Sherie Don, PT, DPT  Acute Rehabilitation Services Pager [phone] Office [place] 10/20/2018, 10:48 AM

## 2018-10-20 NOTE — Progress Notes (Signed)
Progress Note    Glenda [name]  MGQ:[id number] DOB: 11-06-1924  DOA: 10/18/2018 PCP: Marin Olp, MD    Brief Narrative:   Chief complaint: Follow-up right hip fracture.  Medical records reviewed and are as summarized below:  Glenda [name] is an 85 y.o. female with a PMH of aortic stenosis, atrial flutter status post cardioversion 08/20/2016, on chronic Eliquis, hypertension, breast cancer status post mastectomy who was admitted 10/18/2018 for treatment of a right hip fracture status post fall.  Assessment/Plan:   Principal Problem:   Right hip fracture ([place]) Patient suffered from a mechanical fall.  Plain films showed a mildly displaced femoral neck/subcapital fracture with slight impaction/external rotation noted.  Status post closed reduction and percutaneous pinning of the right hip 10/19/2018 after being cleared by cardiology.  Would benefit from osteoporosis work-up, and possible initiation of bisphosphonates at discharge.  Continue vitamin D supplementation.  Active Problems:   Essential hypertension Blood pressure soft, hold Norvasc today.  Cozaar remains on hold, hold an additional day.  Mild bump in creatinine to 1.05.    Chronic diastolic HF (heart failure) (HCC) Blood pressure soft, hold Lasix today.  Monitor fluid volume status closely.  Mild bump in creatinine to 1.05.    Atrial flutter ([place]) Twelve-lead EKG personally reviewed.  Sinus rhythm at 62 bpm with nonspecific T wave abnormalities appreciated.  Continue Eliquis with no need to hold preoperatively per orthopedics.    Malignant neoplasm of upper-outer quadrant of left breast in female, estrogen receptor positive ([place])  Follows with Dr. Sonny Dandy.  Continue Arimidex.    Chronic narcotic use/Chronic pain syndrome Continue pain management.  Monitor bowel function.    Hyponatremia Mild.  Likely from Lasix use.  Sodium WNL today.  There is no height or weight on file to calculate BMI.   Family  Communication/Anticipated D/C date and plan/Code Status   DVT prophylaxis: Eliquis ordered. Code Status: DNR Family Communication: Message left for son. Disposition Plan: We will need SNF for rehab.  She is from an assisted level of care, so will request social work evaluate whether or not they can provide a SNF level of care at discharge.   Medical Consultants:    Orthopedic Surgery   Anti-Infectives:    None  Subjective:   Glenda [name] reports some soreness/pain to the hip.  No nausea, but appetite is poor.  Blood pressure soft.  Reports chronic constipation.  Bowels have not yet moved.  Objective:    Vitals:   10/20/18 0025 10/20/18 0348 10/20/18 0400 10/20/18 0821  BP: 118/74 136/60  (!) 114/57  Pulse: 74 (!) 53 72 64  Resp: 16 14  16   Temp: 98.1 F (36.7 C) 98.2 F (36.8 C)  97.7 F (36.5 C)  TempSrc: Oral Oral  Oral  SpO2: 96% 92%  92%    Intake/Output Summary (Last 24 hours) at 10/20/2018 1139 Last data filed at 10/20/2018 0900 Gross per 24 hour  Intake 1460 ml  Output 505 ml  Net 955 ml   There were no vitals filed for this visit.  Exam: General: No acute distress. Cardiovascular: Heart sounds show a regular rate, and rhythm. No gallops or rubs.  Grade 3/6 SEM. No JVD. Lungs: Clear to auscultation bilaterally with good air movement. No rales, rhonchi or wheezes. Abdomen: Soft, nontender, nondistended with normal active bowel sounds. No masses. No hepatosplenomegaly. Skin: Warm and dry. No rashes or lesions. Extremities: No clubbing or cyanosis. No edema. Pedal pulses 2+.  Data Reviewed:   I have personally reviewed following labs and imaging studies:  Labs: Labs show the following:   Basic Metabolic Panel: Recent Labs  Lab 10/18/18 1102 10/20/18 0838  NA 134* 135  K 3.8 4.0  CL 96* 99  CO2 26 26  GLUCOSE 123* 143*  BUN 26* 37*  CREATININE 0.73 1.05*  CALCIUM 9.0 8.4*   GFR Estimated Creatinine Clearance: 26.3 mL/min (A) (by C-G  formula based on SCr of 1.05 mg/dL (H)).  Coagulation profile Recent Labs  Lab 10/18/18 1227  INR 1.1    CBC: Recent Labs  Lab 10/18/18 1102 10/20/18 0838  WBC 9.4 7.3  HGB 13.8 12.3  HCT 42.1 37.8  MCV 90.9 91.1  PLT 189 176    Microbiology Recent Results (from the past 240 hour(s))  SARS Coronavirus 2 (CEPHEID - Performed in [place] hospital lab), Hosp Order     Status: None   Collection Time: 10/18/18 12:27 PM   Specimen: Nasopharyngeal Swab  Result Value Ref Range Status   SARS Coronavirus 2 NEGATIVE NEGATIVE Final    Comment: (NOTE) If result is NEGATIVE SARS-CoV-2 target nucleic acids are NOT DETECTED. The SARS-CoV-2 RNA is generally detectable in upper and lower  respiratory specimens during the acute phase of infection. The lowest  concentration of SARS-CoV-2 viral copies this assay can detect is 250  copies / mL. A negative result does not preclude SARS-CoV-2 infection  and should not be used as the sole basis for treatment or other  patient management decisions.  A negative result may occur with  improper specimen collection / handling, submission of specimen other  than nasopharyngeal swab, presence of viral mutation(s) within the  areas targeted by this assay, and inadequate number of viral copies  (<250 copies / mL). A negative result must be combined with clinical  observations, patient history, and epidemiological information. If result is POSITIVE SARS-CoV-2 target nucleic acids are DETECTED. The SARS-CoV-2 RNA is generally detectable in upper and lower  respiratory specimens dur ing the acute phase of infection.  Positive  results are indicative of active infection with SARS-CoV-2.  Clinical  correlation with patient history and other diagnostic information is  necessary to determine patient infection status.  Positive results do  not rule out bacterial infection or co-infection with other viruses. If result is PRESUMPTIVE POSTIVE SARS-CoV-2  nucleic acids MAY BE PRESENT.   A presumptive positive result was obtained on the submitted specimen  and confirmed on repeat testing.  While 2019 novel coronavirus  (SARS-CoV-2) nucleic acids may be present in the submitted sample  additional confirmatory testing may be necessary for epidemiological  and / or clinical management purposes  to differentiate between  SARS-CoV-2 and other Sarbecovirus currently known to infect humans.  If clinically indicated additional testing with an alternate test  methodology [phone]) is advised. The SARS-CoV-2 RNA is generally  detectable in upper and lower respiratory sp ecimens during the acute  phase of infection. The expected result is Negative. Fact Sheet for Patients:  StrictlyIdeas.no Fact Sheet for Healthcare Providers: BankingDealers.co.za This test is not yet approved or cleared by the [place] FDA and has been authorized for detection and/or diagnosis of SARS-CoV-2 by FDA under an Emergency Use Authorization (EUA).  This EUA will remain in effect (meaning this test can be used) for the duration of the COVID-19 declaration under Section 564(b)(1) of the Act, 21 U.S.C. section 360bbb-3(b)(1), unless the authorization is terminated or revoked sooner. Performed at [facility], [place] [address], [place], [place] [address]   Surgical pcr screen     Status: None   Collection Time: 10/18/18 11:59 PM   Specimen: Nasal Mucosa; Nasal Swab  Result Value Ref Range Status   MRSA, PCR NEGATIVE NEGATIVE Final   Staphylococcus aureus NEGATIVE NEGATIVE Final    Comment: (NOTE) The Xpert SA Assay (FDA approved for NASAL specimens in patients 62 years of age and older), is one component of a comprehensive surveillance program. It is not intended to diagnose infection nor to guide or monitor treatment. Performed at [facility], [place] [address], [place], [place] [address]      Procedures and diagnostic studies:  Dg C-arm 1-60 Min  Result Date: 10/19/2018 CLINICAL DATA:  Right hip pinning for fracture EXAM: OPERATIVE right HIP (WITH PELVIS IF PERFORMED) 2 VIEWS TECHNIQUE: Fluoroscopic spot image(s) were submitted for interpretation post-operatively. COMPARISON:  10/18/2018. FINDINGS: Subcapital fracture fixed with 3 screws. Satisfactory screw position. Mild impaction of the fracture remains. IMPRESSION: Pinning of subcapital fracture on the right. Electronically Signed   By: Franchot Gallo M.D.   On: 10/19/2018 17:05   Dg Hip Operative Unilat W Or W/o Pelvis Right  Result Date: 10/19/2018 CLINICAL DATA:  Right hip pinning for fracture EXAM: OPERATIVE right HIP (WITH PELVIS IF PERFORMED) 2 VIEWS TECHNIQUE: Fluoroscopic spot image(s) were submitted for interpretation post-operatively. COMPARISON:  10/18/2018. FINDINGS: Subcapital fracture fixed with 3 screws. Satisfactory screw position. Mild impaction of the fracture remains. IMPRESSION: Pinning of subcapital fracture on the right. Electronically Signed   By: Franchot Gallo M.D.   On: 10/19/2018 17:05    Medications:   . amLODipine  5 mg Oral Daily  . anastrozole  1 mg Oral Daily  . apixaban  2.5 mg Oral BID  . cholecalciferol  1,000 Units Oral Daily  . docusate sodium  100 mg Oral BID  . feeding supplement (ENSURE ENLIVE)  237 mL Oral BID BM  . furosemide  30 mg Oral Daily  . pantoprazole  40 mg Oral Daily  . polyethylene glycol  17 g Oral Daily  . sertraline  25 mg Oral Daily  . temazepam  15 mg Oral QHS   Continuous Infusions: . lactated ringers 10 mL/hr at 10/19/18 1234     LOS: 2 days   Jacquelynn Cree  [facility] Pager [phone].   *Please refer to [email or web address], password [email or web address] to get updated schedule on who will round on this patient, as hospitalists switch teams weekly. If 7PM-7AM, please contact night-coverage at [email or web address], password [email or web address] for any overnight needs.  10/20/2018, 11:39 AM

## 2018-10-20 NOTE — Op Note (Signed)
CASSY SPROWL female 85 y.o. 10/19/2018  PreOperative Diagnosis: Right valgus impacted femoral neck fracture  PostOperative Diagnosis: Same  PROCEDURE: Close reduction percutaneous screw fixation of right valgus impacted femoral neck fracture  SURGEON: Melony Overly, MD  ASSISTANT: None  ANESTHESIA: General  FINDINGS: Right valgus impacted femoral neck fracture  IMPLANTS: Synthes 7.3 partially-threaded cannulated screws  INDICATIONS:85 y.o. female with a history of A. fib, aortic stenosis and diastolic congestive heart failure fell onto her right hip and sustained the above injury.  She was seen in the emergency department and admitted to the hospitalist team per protocol.  She was evaluated by cardiology and cleared for surgery.  Given her baseline ambulatory status the patient was indicated for closed reduction percutaneous pinning of her fracture.  This was discussed with the patient and her family and they opted to proceed with surgery after discussion of risk, benefits and alternatives to surgery which included but were not limited to wound healing complications, infection, nonunion, malunion, need for further surgery, demonstrating structures and the possibility of AVN of the femoral head despite fixation.  We also discussed the possibility needing further surgery.  We also discussed the perioperative weightbearing restrictions which she understood and agreed to comply with.  We discussed the perioperative and anesthetic risk which include death.  PROCEDURE: Patient was identified in the preoperative holding area.  The right leg was marked by myself.  The consent was signed myself and the patient.  She was taken the operative suite where general anesthesia was induced without difficulty.  She was then placed supine on the fracture table.  The right leg was placed in the boot and the left leg was well-padded and strapped to the cross beam.  X-rays were obtained prior to  prepping and draping to ensure adequate fluoroscopy could be obtained.  This was acceptable.  There was no change in position of the head with mobilization and moving the patient to the table.  The right hip and thigh region was prepped and draped in usual sterile fashion.  A shower curtain drape was placed.  Fluoroscopy confirmed acceptable closed reduction of the valgus impacted femoral neck fracture.  We began by placing the percutaneous pins for the screws in an acceptable position up the femoral neck into the femoral head.  This was done in an inverted triangle fashion.  X-rays confirmed appropriate pin placement.  Then 3 partially-threaded cannulated screws were placed across the fracture site had good purchase.  Then fluoroscopy confirmed that the screws were not within the joint.  Then the guide pins were removed and final x-rays were obtained.  The wounds were irrigated with saline.  2-0 Vicryl was used for subcuticular tissue and staples were used for the skin.  A Mepilex dressing was placed.  All counts were correct at the end the case.  She tolerated it well.  She was awakened from anesthesia and taken recovery in stable condition.  There were no complications.  POST OPERATIVE INSTRUCTIONS: Weightbearing as tolerated right lower extremity Keep dressing in place Mobilize with physical therapy for disposition Medical management per hospitalist team Okay to restart Eliquis for blood clot prevention She will follow-up with me in 2 weeks for x-rays of the right hip and wound check.  TOURNIQUET TIME: No tourniquet was used  BLOOD LOSS:  Minimal         DRAINS: none         SPECIMEN: none       COMPLICATIONS:  * No complications  entered in OR log *         Disposition: PACU - hemodynamically stable.         Condition: stable

## 2018-10-20 NOTE — Plan of Care (Signed)
  Problem: Elimination: Goal: Will not experience complications related to bowel motility 10/20/2018 0319 by Josepha Pigg, RN Outcome: Progressing 10/20/2018 0311 by Josepha Pigg, RN Outcome: Progressing   Problem: Safety: Goal: Ability to remain free from injury will improve 10/20/2018 0319 by Josepha Pigg, RN Outcome: Progressing 10/20/2018 0311 by Josepha Pigg, RN Outcome: Progressing   Problem: Pain Managment: Goal: General experience of comfort will improve 10/20/2018 0319 by Josepha Pigg, RN Outcome: Progressing 10/20/2018 0311 by Josepha Pigg, RN Outcome: Progressing

## 2018-10-20 NOTE — Plan of Care (Signed)
  Problem: Safety: Goal: Ability to remain free from injury will improve Outcome: Progressing   Problem: Pain Managment: Goal: General experience of comfort will improve Outcome: Progressing   Problem: Elimination: Goal: Will not experience complications related to bowel motility Outcome: Progressing   

## 2018-10-20 NOTE — Plan of Care (Signed)

## 2018-10-21 NOTE — Progress Notes (Signed)
PROGRESS NOTE    Glenda [name]  WUJ:[id number] DOB: 10-01-1924 DOA: 10/18/2018 PCP: Marin Olp, MD    Brief Narrative:  85 year old female who presented after mechanical fall.  She does have a significant past medical history for aortic stenosis, atrial flutter status post cardioversion, hypertension and history of breast cancer status post mastectomy.  Patient reported severe right hip pain after suffering a mechanical fall.  She was ambulating using her walker, apparently she lost her balance and fell on the right side.  No head trauma.  On her initial physical examination blood pressure 142/65, heart rate 60, respiratory rate 19, oxygen saturation 91%, she had moist mucous membranes, her lungs were clear to auscultation bilaterally, heart S1-S2 present rhythmic, the abdomen was soft nontender, no lower extremity edema.  Shortening of the right lower extremity compared to the left.  Hip x-ray showed mildly displaced high femoral neck/subcapital fracture of the right hip.  Patient was admitted to the hospital with a working diagnosis of right hip fracture.   Assessment & Plan:   Principal Problem:   Hip fracture ([place]) Active Problems:   Essential hypertension   Chronic diastolic HF (heart failure) (HCC)   Atrial flutter (HCC)   Malignant neoplasm of upper-outer quadrant of left breast in female, estrogen receptor positive (HCC)   Chronic narcotic use   Chronic pain syndrome   Femur fracture, right (HCC)   Hyponatremia   1. Right hip fracture. Patient is sp closed reduction and percutaneous pinning on 07/30. Continue to have pain and ambulatory dysfunction. Pain improves with analgesics but not yet back to baseline. Continue pain control, dvt prophylaxis and physical therapy evaluation. Plan for SNF.   2. History of atrial flutter. Patient rate controlled, continue anticoagulation with apixaban.   3. HTN. Blood pressure today 149/66. Continue amlodipine 5 mg daily, holding  losartan at home on 100 mg daily.   4. Diastolic heart failure. Clinically stable, continue blood pressure control with amlodipine and diuresis with furosemide.   5. Hx of breast cancer. Follow up as outpatient. Continue anastrazole.   6. Depression. Continue setraline and temazepam,   7. Hyponatremia. Na at 135 yesterday, patient is tolerating po well.  DVT prophylaxis: apixaban   Code Status:  dnr  Family Communication: no family at the bedside  Disposition Plan/ discharge barriers: pending snf placement.   There is no height or weight on file to calculate BMI. Malnutrition Type:  Nutrition Problem: Increased nutrient needs Etiology: post-op healing   Malnutrition Characteristics:  Signs/Symptoms: estimated needs   Nutrition Interventions:  Interventions: Ensure Enlive (each supplement provides 350kcal and 20 grams of protein)  RN Pressure Injury Documentation:     Consultants:   Orthopedics  Cardiology   Procedures:  Close reduction percutaneous screw fixation of right valgus impacted femoral neck fracture  Antimicrobials:       Subjective: Patient continue to have right hip pain and difficulty walking, not yet back to her baseline, symptoms improved with analgesics, associated with generalized weakness.   Objective: Vitals:   10/20/18 1933 10/21/18 0344 10/21/18 1015 10/21/18 1020  BP: (!) 147/61 (!) 143/80 (!) 149/66 (!) 149/66  Pulse: 71 (!) 57  66  Resp: 16 18  14   Temp: 97.8 F (36.6 C) 97.7 F (36.5 C)  98.3 F (36.8 C)  TempSrc: Oral Oral  Oral  SpO2: 96% 96%  96%    Intake/Output Summary (Last 24 hours) at 10/21/2018 1133 Last data filed at 10/21/2018 0551 Gross per 24 hour  Intake 589.9 ml  Output 100 ml  Net 489.9 ml   There were no vitals filed for this visit.  Examination:   General: deconditioned  Neurology: Awake and alert, non focal  E ENT: mild pallor, no icterus, oral mucosa moist Cardiovascular: No JVD. S1-S2 present,  rhythmic, no gallops, rubs, or murmurs. Trace lower extremity edema. Pulmonary: positive breath sounds bilaterally, adequate air movement, no wheezing, rhonchi or rales. Gastrointestinal. Abdomen with no organomegaly, non tender, no rebound or guarding Skin. No rashes Musculoskeletal: no joint deformities     Data Reviewed: I have personally reviewed following labs and imaging studies  CBC: Recent Labs  Lab 10/18/18 1102 10/20/18 0838  WBC 9.4 7.3  HGB 13.8 12.3  HCT 42.1 37.8  MCV 90.9 91.1  PLT 189 409   Basic Metabolic Panel: Recent Labs  Lab 10/18/18 1102 10/20/18 0838  NA 134* 135  K 3.8 4.0  CL 96* 99  CO2 26 26  GLUCOSE 123* 143*  BUN 26* 37*  CREATININE 0.73 1.05*  CALCIUM 9.0 8.4*   GFR: Estimated Creatinine Clearance: 26.3 mL/min (A) (by C-G formula based on SCr of 1.05 mg/dL (H)). Liver Function Tests: No results for input(s): AST, ALT, ALKPHOS, BILITOT, PROT, ALBUMIN in the last 168 hours. No results for input(s): LIPASE, AMYLASE in the last 168 hours. No results for input(s): AMMONIA in the last 168 hours. Coagulation Profile: Recent Labs  Lab 10/18/18 1227  INR 1.1   Cardiac Enzymes: No results for input(s): CKTOTAL, CKMB, CKMBINDEX, TROPONINI in the last 168 hours. BNP (last 3 results) No results for input(s): PROBNP in the last 8760 hours. HbA1C: No results for input(s): HGBA1C in the last 72 hours. CBG: No results for input(s): GLUCAP in the last 168 hours. Lipid Profile: No results for input(s): CHOL, HDL, LDLCALC, TRIG, CHOLHDL, LDLDIRECT in the last 72 hours. Thyroid Function Tests: No results for input(s): TSH, T4TOTAL, FREET4, T3FREE, THYROIDAB in the last 72 hours. Anemia Panel: No results for input(s): VITAMINB12, FOLATE, FERRITIN, TIBC, IRON, RETICCTPCT in the last 72 hours.    Radiology Studies: I have reviewed all of the imaging during this hospital visit personally     Scheduled Meds: . amLODipine  5 mg Oral Daily  .  anastrozole  1 mg Oral Daily  . apixaban  2.5 mg Oral BID  . cholecalciferol  1,000 Units Oral Daily  . docusate sodium  100 mg Oral BID  . feeding supplement (ENSURE ENLIVE)  237 mL Oral BID BM  . furosemide  30 mg Oral Daily  . pantoprazole  40 mg Oral Daily  . polyethylene glycol  17 g Oral Daily  . sertraline  25 mg Oral Daily  . temazepam  15 mg Oral QHS   Continuous Infusions: . lactated ringers 10 mL/hr at 10/20/18 1800     LOS: 3 days        Keniah Klemmer Gerome Apley, MD

## 2018-10-21 NOTE — Plan of Care (Signed)
  Problem: Coping: Goal: Level of anxiety will decrease Outcome: Progressing   Problem: Pain Managment: Goal: General experience of comfort will improve Outcome: Progressing   

## 2018-10-21 NOTE — Plan of Care (Signed)
  Problem: Pain Managment: Goal: General experience of comfort will improve Outcome: Progressing   

## 2018-10-21 NOTE — Progress Notes (Signed)
RN spoke with pt son, [name] over phone. Pt requests son to be updated with pt care as it becomes available, other questions answered to their satisfaction.  [facility] [place] via cell phone: [phone]  Willia Craze, RN 10/21/2018 12:00 PM

## 2018-10-22 NOTE — Progress Notes (Signed)
PROGRESS NOTE    Glenda [name]  XHB:[id number] DOB: Nov 03, 1924 DOA: 10/18/2018 PCP: Marin Olp, MD    Brief Narrative:  85 year old female who presented after mechanical fall.  She does have a significant past medical history for aortic stenosis, atrial flutter status post cardioversion, hypertension and history of breast cancer status post mastectomy.  Patient reported severe right hip pain after suffering a mechanical fall.  She was ambulating using her walker, apparently she lost her balance and fell on the right side.  No head trauma.  On her initial physical examination blood pressure 142/65, heart rate 60, respiratory rate 19, oxygen saturation 91%, she had moist mucous membranes, her lungs were clear to auscultation bilaterally, heart S1-S2 present rhythmic, the abdomen was soft nontender, no lower extremity edema.  Shortening of the right lower extremity compared to the left.  Hip x-ray showed mildly displaced high femoral neck/subcapital fracture of the right hip.  Patient was admitted to the hospital with a working diagnosis of right hip fracture.   Assessment & Plan:   Principal Problem:   Hip fracture ([place]) Active Problems:   Essential hypertension   Chronic diastolic HF (heart failure) (HCC)   Atrial flutter (HCC)   Malignant neoplasm of upper-outer quadrant of left breast in female, estrogen receptor positive (HCC)   Chronic narcotic use   Chronic pain syndrome   Femur fracture, right (HCC)   Hyponatremia   1. Right hip fracture. sp closed reduction and percutaneous pinning on 07/30. Right hip pain is persistent but improved with analgesics, will continue dvt prophylaxis and physical therapy. Patient is medically stable for dc to SNF. Follow with social services.   2. History of atrial flutter.  Anticoagulation with apixaban. Rate controlled.   3. HTN. Blood pressure controlled with amlodipine 5 mg daily, continue to hold on losartan for now.   4.  Diastolic heart failure in the setting of aortic stenosis. No signs of exacerbation, continue home dose of diuretic therapy.   5. Hx of breast cancer. On anastrazole.   6. Depression. On setraline and temazepam,   7. Hyponatremia. Patient tolerating po well, last Na on 07.31 was 135.  DVT prophylaxis: apixaban   Code Status:  dnr  Family Communication: no family at the bedside  Disposition Plan/ discharge barriers: pending snf placement.      There is no height or weight on file to calculate BMI. Malnutrition Type:  Nutrition Problem: Increased nutrient needs Etiology: post-op healing   Malnutrition Characteristics:  Signs/Symptoms: estimated needs   Nutrition Interventions:  Interventions: Ensure Enlive (each supplement provides 350kcal and 20 grams of protein)  RN Pressure Injury Documentation:     Consultants:   Orthopedics  Procedures:   Right hip closed reduction and percutaneous pinning on 07/30  Antimicrobials:       Subjective: Patient continue to have right hip pain, moderate in intensity, worse with movement, improved with analgesics, no radiation, no associated nausea or vomiting. No dyspnea or chest pain.   Objective: Vitals:   10/21/18 1738 10/21/18 1946 10/22/18 0358 10/22/18 0916  BP: (!) 145/65 (!) 127/58 (!) 144/62 109/69  Pulse: 84 71 64 64  Resp:    16  Temp: 97.8 F (36.6 C) 98.2 F (36.8 C) 98.1 F (36.7 C) 98.1 F (36.7 C)  TempSrc: Oral Oral Oral Oral  SpO2: 95% 97% 95% 95%    Intake/Output Summary (Last 24 hours) at 10/22/2018 1101 Last data filed at 10/22/2018 0900 Gross per 24 hour  Intake  960.72 ml  Output 400 ml  Net 560.72 ml   There were no vitals filed for this visit.  Examination:   General: deconditioned  Neurology: Awake and alert, non focal  E ENT: no pallor, no icterus, oral mucosa moist Cardiovascular: No JVD. S1-S2 present, rhythmic, no gallops, or rubs, positive systolic murmur at the base. No  lower extremity edema. Pulmonary: positive breath sounds bilaterally, adequate air movement, no wheezing, rhonchi or rales. Gastrointestinal. Abdomen with, no organomegaly, non tender, no rebound or guarding Skin. No rashes Musculoskeletal: no joint deformities     Data Reviewed: I have personally reviewed following labs and imaging studies  CBC: Recent Labs  Lab 10/18/18 1102 10/20/18 0838  WBC 9.4 7.3  HGB 13.8 12.3  HCT 42.1 37.8  MCV 90.9 91.1  PLT 189 326   Basic Metabolic Panel: Recent Labs  Lab 10/18/18 1102 10/20/18 0838  NA 134* 135  K 3.8 4.0  CL 96* 99  CO2 26 26  GLUCOSE 123* 143*  BUN 26* 37*  CREATININE 0.73 1.05*  CALCIUM 9.0 8.4*   GFR: Estimated Creatinine Clearance: 26.3 mL/min (A) (by C-G formula based on SCr of 1.05 mg/dL (H)). Liver Function Tests: No results for input(s): AST, ALT, ALKPHOS, BILITOT, PROT, ALBUMIN in the last 168 hours. No results for input(s): LIPASE, AMYLASE in the last 168 hours. No results for input(s): AMMONIA in the last 168 hours. Coagulation Profile: Recent Labs  Lab 10/18/18 1227  INR 1.1   Cardiac Enzymes: No results for input(s): CKTOTAL, CKMB, CKMBINDEX, TROPONINI in the last 168 hours. BNP (last 3 results) No results for input(s): PROBNP in the last 8760 hours. HbA1C: No results for input(s): HGBA1C in the last 72 hours. CBG: No results for input(s): GLUCAP in the last 168 hours. Lipid Profile: No results for input(s): CHOL, HDL, LDLCALC, TRIG, CHOLHDL, LDLDIRECT in the last 72 hours. Thyroid Function Tests: No results for input(s): TSH, T4TOTAL, FREET4, T3FREE, THYROIDAB in the last 72 hours. Anemia Panel: No results for input(s): VITAMINB12, FOLATE, FERRITIN, TIBC, IRON, RETICCTPCT in the last 72 hours.    Radiology Studies: I have reviewed all of the imaging during this hospital visit personally     Scheduled Meds: . amLODipine  5 mg Oral Daily  . anastrozole  1 mg Oral Daily  . apixaban   2.5 mg Oral BID  . cholecalciferol  1,000 Units Oral Daily  . docusate sodium  100 mg Oral BID  . feeding supplement (ENSURE ENLIVE)  237 mL Oral BID BM  . furosemide  30 mg Oral Daily  . pantoprazole  40 mg Oral Daily  . polyethylene glycol  17 g Oral Daily  . sertraline  25 mg Oral Daily  . temazepam  15 mg Oral QHS   Continuous Infusions:   LOS: 4 days        Mauricio Gerome Apley, MD

## 2018-10-22 NOTE — Plan of Care (Signed)
  Problem: Pain Managment: Goal: General experience of comfort will improve Outcome: Progressing   

## 2018-10-23 LAB — BASIC METABOLIC PANEL
Anion gap: 8 (ref 5–15)
BUN: 27 mg/dL — ABNORMAL HIGH (ref 8–23)
CO2: 28 mmol/L (ref 22–32)
Calcium: 8.5 mg/dL — ABNORMAL LOW (ref 8.9–10.3)
Chloride: 98 mmol/L (ref 98–111)
Creatinine, Ser: 0.92 mg/dL (ref 0.44–1.00)
GFR calc Af Amer: >60 mL/min (ref >60)
GFR calc non Af Amer: 54 mL/min — ABNORMAL LOW (ref >60)
Glucose, Bld: 99 mg/dL (ref 70–99)
Potassium: 4.5 mmol/L (ref 3.5–5.1)
Sodium: 134 mmol/L — ABNORMAL LOW (ref 135–145)

## 2018-10-23 MED ORDER — ENSURE ENLIVE PO LIQD: 237.0000 mL | Freq: Two times a day (BID) | ORAL | 0 refills | Status: AC

## 2018-10-23 MED ORDER — HYDROCODONE-ACETAMINOPHEN 5-325 MG PO TABS: 1.0000 | ORAL_TABLET | Freq: Four times a day (QID) | ORAL | 0 refills | Status: DC | PRN

## 2018-10-23 MED ORDER — TEMAZEPAM 15 MG PO CAPS: 15.0000 mg | ORAL_CAPSULE | Freq: Every day | ORAL | 5 refills | Status: DC

## 2018-10-23 NOTE — Care Management Important Message (Signed)
Important Message  Patient Details  Name: Glenda [name] MRN: [id number] Date of Birth: [birth year]   Medicare Important Message Given:  Yes     Memory Argue 10/23/2018, 1:00 PM    PATIENT SIGNED IM

## 2018-10-23 NOTE — Discharge Summary (Addendum)
Physician Discharge Summary  Glenda [name] NFA:[id number] DOB: Mar 30, 1924 DOA: 10/18/2018  PCP: Marin Olp, MD  Admit date: 10/18/2018 Discharge date: 10/23/2018  Admitted From: ALF  Disposition:  SNF   Recommendations for Outpatient Follow-up and new medication changes:  1. Follow up with Dr. Yong Channel in 7 days.  2. Patient has been placed on as needed hydrocodone-acetaminophen for pain control. 3. Patient has been started on nutritional supplements, Ensure for post op healing. 4.  SARS COVID 19 screening test, negative at discharge.   Home Health: NA   Equipment/Devices: NA    Discharge Condition: stable  CODE STATUS: DNR   Diet recommendation: heart healthy   Brief/Interim Summary: 85 year old female who presented after a mechanical fall. She does have a significant past medical history for aortic stenosis, atrial flutter status post cardioversion, hypertension and history of breast cancer status post mastectomy. Patient reported severe right hip pain after suffering a mechanical fall. She was ambulating using her walker, apparently she lost her balance and fell on the right side. No head trauma. On her initial physical examination blood pressure 142/65, heart rate 60, respiratory rate 19, oxygen saturation 91%, she had moist mucous membranes, her lungs were clear to auscultation bilaterally, heart S1-S2 present rhythmic, the abdomen was soft nontender, no lower extremity edema. Positive shortening of the right lower extremity compared to the left.  Sodium 134, potassium 3.8, chloride 96, bicarb 26, glucose 123, BUN 26, creatinine 0.73, white count 9.4, hemoglobin 13.8, hematocrit 42.1, platelets 189.  SARS COVID-19 negative.  Thoracic and lumbar spine films with no acute changes. Hip x-ray showed mildly displaced high femoral neck/subcapital fracture of the right hip.  EKG 62 bpm, rightward axis, first-degree AV block, sinus, no ST segment or T wave changes.  Patient was  admitted to the hospitalwith aworking diagnosis of right hip fracture.  1.  Right hip fracture status post close reduction and percutaneous pinning July 30.  Patient was admitted to the medical ward, she was placed on a remote telemetry monitor, received analgesics, DVT prophylaxis and she was consulted by orthopedics.  Patient underwent a closed reduction with percutaneous pinning with no major complications.  Physical therapy has recommended continue therapy at a skilled nursing facility.  2.  Paroxysmal atrial flutter.  Patient has remained in sinus rhythm, continue anticoagulation with apixaban.  3.  Diastolic heart failure in the setting of aortic stenosis.  No signs of decompensation, she was seen by cardiology as a pre-operative evaluation. At discharge patient will resume losartan and furosemide.  4. HTN.  Her antihypertensive agents were held during her hospitalization, at discharge she will resume amlodipine, furosemide and losartan.  4.  Depression.  Continue sertraline and temazepam.  5.  History of breast cancer.  Continue anastrozole.  6.  Transitory hyponatremia.  Resolved with supportive medical therapy.  Discharge Diagnoses:  Principal Problem:   Hip fracture ([place]) Active Problems:   Essential hypertension   Chronic diastolic HF (heart failure) (HCC)   Atrial flutter (HCC)   Malignant neoplasm of upper-outer quadrant of left breast in female, estrogen receptor positive (HCC)   Chronic narcotic use   Chronic pain syndrome   Femur fracture, right (HCC)   Hyponatremia    Discharge Instructions   Allergies as of 10/23/2018      Reactions   Ace Inhibitors Other (See Comments)   Cough   Halcion [triazolam]    UNSPECIFIED REACTION    Pentazocine Lactate    UNSPECIFIED REACTION    Sulfa  Drugs Cross Reactors    UNSPECIFIED REACTION    Trazodone And Nefazodone Other (See Comments)   Per MAR   Amitriptyline Other (See Comments)   Hyperactivity   Avelox  [moxifloxacin Hcl In Nacl] Other (See Comments)   dizziness   Penicillins Rash      Medication List    TAKE these medications   amLODipine 5 MG tablet Commonly known as: NORVASC Take 1 tablet (5 mg total) by mouth daily.   anastrozole 1 MG tablet Commonly known as: ARIMIDEX Take 1 tablet (1 mg total) by mouth daily.   apixaban 2.5 MG Tabs tablet Commonly known as: Eliquis Take 1 tablet (2.5 mg total) by mouth 2 (two) times daily.   benzonatate 100 MG capsule Commonly known as: TESSALON Take 100 mg by mouth every 8 (eight) hours as needed for cough.   CEROVITE ADVANCED FORMULA PO Take 1 tablet by mouth daily. (0800)   Cholecalciferol 25 MCG (1000 UT) tablet Take 1,000 Units by mouth daily.   Cranberry 425 MG Caps Take 425 mg by mouth 2 (two) times daily.   docusate sodium 100 MG capsule Commonly known as: COLACE Take 100 mg by mouth 2 (two) times daily.   feeding supplement (ENSURE ENLIVE) Liqd Take 237 mLs by mouth 2 (two) times daily between meals.   furosemide 20 MG tablet Commonly known as: LASIX Take 1.5 tablets (30 mg total) by mouth daily.   HYDROcodone-acetaminophen 5-325 MG tablet Commonly known as: NORCO/VICODIN Take 1 tablet by mouth every 6 (six) hours as needed for moderate pain.   losartan 100 MG tablet Commonly known as: COZAAR Take 1 tablet (100 mg total) by mouth daily.   omeprazole 40 MG capsule Commonly known as: PRILOSEC Take 40 mg by mouth daily.   ondansetron 4 MG tablet Commonly known as: ZOFRAN Take 4 mg by mouth every 6 (six) hours as needed for nausea.   polyethylene glycol 17 g packet Commonly known as: MIRALAX / GLYCOLAX Take 17 g by mouth daily. Taken  Daily Monday to Saturday.   sertraline 25 MG tablet Commonly known as: ZOLOFT TAKE (1) TABLET BY MOUTH ONCE DAILY. What changed:   how much to take  how to take this  when to take this   temazepam 15 MG capsule Commonly known as: RESTORIL Take 1 capsule (15 mg  total) by mouth at bedtime. Should separate from hydrocodone by at least 6 hours.      Follow-up Information    Erle Crocker, MD. Schedule an appointment as soon as possible for a visit in 2 weeks.   Specialty: Orthopedic Surgery Contact information: [place] [address] [phone]          Allergies  Allergen Reactions  . Ace Inhibitors Other (See Comments)    Cough  . Halcion [Triazolam]     UNSPECIFIED REACTION   . Pentazocine Lactate     UNSPECIFIED REACTION   . Sulfa Drugs Cross Reactors     UNSPECIFIED REACTION   . Trazodone And Nefazodone Other (See Comments)    Per MAR  . Amitriptyline Other (See Comments)    Hyperactivity  . Avelox [Moxifloxacin Hcl In Nacl] Other (See Comments)    dizziness  . Penicillins Rash    Consultations:  Orthopedics  Cardiology    Procedures/Studies: Dg Thoracic Spine 2 View  Result Date: 10/18/2018 CLINICAL DATA:  [place] this morning.  Back pain. EXAM: THORACIC SPINE 2 VIEWS COMPARISON:  None. FINDINGS: Normal alignment of the  thoracic vertebral bodies. No acute thoracic compression fractures identified. The visualized posterior ribs appear intact. There is tortuosity and calcification of the thoracic aorta. The visualized lungs are clear. IMPRESSION: Normal alignment and no acute bony findings. Electronically Signed   By: Marijo Sanes M.D.   On: 10/18/2018 11:20   Dg Lumbar Spine Complete  Result Date: 10/18/2018 CLINICAL DATA:  [place].  Back pain. EXAM: LUMBAR SPINE - COMPLETE 4+ VIEW COMPARISON:  MRI lumbar spine from 2016 FINDINGS: Normal alignment of the lumbar vertebral bodies. No acute fractures identified. Interbody fusion noted at L5-S1. Right-sided sacral plasty changes are noted. IMPRESSION: No acute bony findings. Electronically Signed   By: Marijo Sanes M.D.   On: 10/18/2018 11:22   Dg C-arm 1-60 Min  Result Date: 10/19/2018 CLINICAL DATA:  Right hip pinning for fracture EXAM: OPERATIVE right  HIP (WITH PELVIS IF PERFORMED) 2 VIEWS TECHNIQUE: Fluoroscopic spot image(s) were submitted for interpretation post-operatively. COMPARISON:  10/18/2018. FINDINGS: Subcapital fracture fixed with 3 screws. Satisfactory screw position. Mild impaction of the fracture remains. IMPRESSION: Pinning of subcapital fracture on the right. Electronically Signed   By: Franchot Gallo M.D.   On: 10/19/2018 17:05   Dg Hip Operative Unilat W Or W/o Pelvis Right  Result Date: 10/19/2018 CLINICAL DATA:  Right hip pinning for fracture EXAM: OPERATIVE right HIP (WITH PELVIS IF PERFORMED) 2 VIEWS TECHNIQUE: Fluoroscopic spot image(s) were submitted for interpretation post-operatively. COMPARISON:  10/18/2018. FINDINGS: Subcapital fracture fixed with 3 screws. Satisfactory screw position. Mild impaction of the fracture remains. IMPRESSION: Pinning of subcapital fracture on the right. Electronically Signed   By: Franchot Gallo M.D.   On: 10/19/2018 17:05   Dg Hip Unilat With Pelvis 2-3 Views Right  Result Date: 10/18/2018 CLINICAL DATA:  [place] this morning.  Right hip pain. EXAM: DG HIP (WITH OR WITHOUT PELVIS) 2-3V RIGHT COMPARISON:  None. FINDINGS: There is a mildly displaced high femoral neck/subcapital fracture with slight impaction and external rotation. Both hips are normally located. No significant degenerative changes given the patient's age. The pubic symphysis and SI joints are intact. No obvious pelvic fractures. Bone cement noted in the right sacrum from prior sacral plasty. IMPRESSION: Mildly displaced high femoral neck/subcapital fracture of the right hip. Electronically Signed   By: Marijo Sanes M.D.   On: 10/18/2018 11:18      Procedures: Closed reduction and percutaneous pinning July 30.   Subjective: Patient is feeling well, her right hip pain is still present, worse with movement and improved with analgesics.   Discharge Exam: Vitals:   10/23/18 0437 10/23/18 0816  BP: 131/64 (!) 133/56  Pulse: (!)  54 62  Resp:  16  Temp: 98.2 F (36.8 C) 98.2 F (36.8 C)  SpO2: 95% 95%   Vitals:   10/22/18 1546 10/22/18 1939 10/23/18 0437 10/23/18 0816  BP: (!) 149/59 132/63 131/64 (!) 133/56  Pulse: (!) 53 67 (!) 54 62  Resp: 16   16  Temp: 98.2 F (36.8 C) 98.4 F (36.9 C) 98.2 F (36.8 C) 98.2 F (36.8 C)  TempSrc: Oral Oral Oral Oral  SpO2: 96% 95% 95% 95%  Weight:      Height:        General: deconditioned  Neurology: Awake and alert, non focal  E ENT: no pallor, no icterus, oral mucosa moist Cardiovascular: No JVD. S1-S2 present, rhythmic, positive extra beats, no gallops or rubs. Positive systolic murmur at the base. 3/6 murmur. No lower extremity edema. Pulmonary: positive breath  sounds bilaterally, adequate air movement, no wheezing, rhonchi or rales. Gastrointestinal. Abdomen with, no organomegaly, non tender, no rebound or guarding Skin. No rashes Musculoskeletal: no joint deformities   The results of significant diagnostics from this hospitalization (including imaging, microbiology, ancillary and laboratory) are listed below for reference.     Microbiology: Recent Results (from the past 240 hour(s))  SARS Coronavirus 2 (CEPHEID - Performed in [place] hospital lab), Hosp Order     Status: None   Collection Time: 10/18/18 12:27 PM   Specimen: Nasopharyngeal Swab  Result Value Ref Range Status   SARS Coronavirus 2 NEGATIVE NEGATIVE Final    Comment: (NOTE) If result is NEGATIVE SARS-CoV-2 target nucleic acids are NOT DETECTED. The SARS-CoV-2 RNA is generally detectable in upper and lower  respiratory specimens during the acute phase of infection. The lowest  concentration of SARS-CoV-2 viral copies this assay can detect is 250  copies / mL. A negative result does not preclude SARS-CoV-2 infection  and should not be used as the sole basis for treatment or other  patient management decisions.  A negative result may occur with  improper specimen collection /  handling, submission of specimen other  than nasopharyngeal swab, presence of viral mutation(s) within the  areas targeted by this assay, and inadequate number of viral copies  (<250 copies / mL). A negative result must be combined with clinical  observations, patient history, and epidemiological information. If result is POSITIVE SARS-CoV-2 target nucleic acids are DETECTED. The SARS-CoV-2 RNA is generally detectable in upper and lower  respiratory specimens dur ing the acute phase of infection.  Positive  results are indicative of active infection with SARS-CoV-2.  Clinical  correlation with patient history and other diagnostic information is  necessary to determine patient infection status.  Positive results do  not rule out bacterial infection or co-infection with other viruses. If result is PRESUMPTIVE POSTIVE SARS-CoV-2 nucleic acids MAY BE PRESENT.   A presumptive positive result was obtained on the submitted specimen  and confirmed on repeat testing.  While 2019 novel coronavirus  (SARS-CoV-2) nucleic acids may be present in the submitted sample  additional confirmatory testing may be necessary for epidemiological  and / or clinical management purposes  to differentiate between  SARS-CoV-2 and other Sarbecovirus currently known to infect humans.  If clinically indicated additional testing with an alternate test  methodology [phone]) is advised. The SARS-CoV-2 RNA is generally  detectable in upper and lower respiratory sp ecimens during the acute  phase of infection. The expected result is Negative. Fact Sheet for Patients:  StrictlyIdeas.no Fact Sheet for Healthcare Providers: BankingDealers.co.za This test is not yet approved or cleared by the [place] FDA and has been authorized for detection and/or diagnosis of SARS-CoV-2 by FDA under an Emergency Use Authorization (EUA).  This EUA will remain in effect (meaning this  test can be used) for the duration of the COVID-19 declaration under Section 564(b)(1) of the Act, 21 U.S.C. section 360bbb-3(b)(1), unless the authorization is terminated or revoked sooner. Performed at [facility], [place] [address] [place], [place] [address]   Surgical pcr screen     Status: None   Collection Time: 10/18/18 11:59 PM   Specimen: Nasal Mucosa; Nasal Swab  Result Value Ref Range Status   MRSA, PCR NEGATIVE NEGATIVE Final   Staphylococcus aureus NEGATIVE NEGATIVE Final    Comment: (NOTE) The Xpert SA Assay (FDA approved for NASAL specimens in patients 90 years of age and older), is  one component of a comprehensive surveillance program. It is not intended to diagnose infection nor to guide or monitor treatment. Performed at [facility], [place] [address], [place], [place] [address]      Labs: BNP (last 3 results) No results for input(s): BNP in the last 8760 hours. Basic Metabolic Panel: Recent Labs  Lab 10/18/18 1102 10/20/18 0838 10/23/18 0426  NA 134* 135 134*  K 3.8 4.0 4.5  CL 96* 99 98  CO2 26 26 28   GLUCOSE 123* 143* 99  BUN 26* 37* 27*  CREATININE 0.73 1.05* 0.92  CALCIUM 9.0 8.4* 8.5*   Liver Function Tests: No results for input(s): AST, ALT, ALKPHOS, BILITOT, PROT, ALBUMIN in the last 168 hours. No results for input(s): LIPASE, AMYLASE in the last 168 hours. No results for input(s): AMMONIA in the last 168 hours. CBC: Recent Labs  Lab 10/18/18 1102 10/20/18 0838  WBC 9.4 7.3  HGB 13.8 12.3  HCT 42.1 37.8  MCV 90.9 91.1  PLT 189 176   Cardiac Enzymes: No results for input(s): CKTOTAL, CKMB, CKMBINDEX, TROPONINI in the last 168 hours. BNP: Invalid input(s): POCBNP CBG: No results for input(s): GLUCAP in the last 168 hours. D-Dimer No results for input(s): DDIMER in the last 72 hours. Hgb A1c No results for input(s): HGBA1C in the last 72 hours. Lipid Profile No results for input(s): CHOL, HDL, LDLCALC,  TRIG, CHOLHDL, LDLDIRECT in the last 72 hours. Thyroid function studies No results for input(s): TSH, T4TOTAL, T3FREE, THYROIDAB in the last 72 hours.  Invalid input(s): FREET3 Anemia work up No results for input(s): VITAMINB12, FOLATE, FERRITIN, TIBC, IRON, RETICCTPCT in the last 72 hours. Urinalysis    Component Value Date/Time   COLORURINE YELLOW 01/16/2018 1300   APPEARANCEUR CLEAR 01/16/2018 1300   LABSPEC 1.010 01/16/2018 1300   PHURINE 6.0 01/16/2018 1300   GLUCOSEU NEGATIVE 01/16/2018 1300   HGBUR NEGATIVE 01/16/2018 1300   BILIRUBINUR NEGATIVE 01/16/2018 1300   BILIRUBINUR Negative 12/19/2017 1349   KETONESUR NEGATIVE 01/16/2018 1300   PROTEINUR Negative 12/19/2017 1349   PROTEINUR NEGATIVE 02/11/2017 2105   UROBILINOGEN 0.2 01/16/2018 1300   NITRITE NEGATIVE 01/16/2018 1300   LEUKOCYTESUR NEGATIVE 01/16/2018 1300   Sepsis Labs Invalid input(s): PROCALCITONIN,  WBC,  LACTICIDVEN Microbiology Recent Results (from the past 240 hour(s))  SARS Coronavirus 2 (CEPHEID - Performed in [place] hospital lab), Hosp Order     Status: None   Collection Time: 10/18/18 12:27 PM   Specimen: Nasopharyngeal Swab  Result Value Ref Range Status   SARS Coronavirus 2 NEGATIVE NEGATIVE Final    Comment: (NOTE) If result is NEGATIVE SARS-CoV-2 target nucleic acids are NOT DETECTED. The SARS-CoV-2 RNA is generally detectable in upper and lower  respiratory specimens during the acute phase of infection. The lowest  concentration of SARS-CoV-2 viral copies this assay can detect is 250  copies / mL. A negative result does not preclude SARS-CoV-2 infection  and should not be used as the sole basis for treatment or other  patient management decisions.  A negative result may occur with  improper specimen collection / handling, submission of specimen other  than nasopharyngeal swab, presence of viral mutation(s) within the  areas targeted by this assay, and inadequate number of viral copies   (<250 copies / mL). A negative result must be combined with clinical  observations, patient history, and epidemiological information. If result is POSITIVE SARS-CoV-2 target nucleic acids are DETECTED. The SARS-CoV-2 RNA is generally detectable in upper  and lower  respiratory specimens dur ing the acute phase of infection.  Positive  results are indicative of active infection with SARS-CoV-2.  Clinical  correlation with patient history and other diagnostic information is  necessary to determine patient infection status.  Positive results do  not rule out bacterial infection or co-infection with other viruses. If result is PRESUMPTIVE POSTIVE SARS-CoV-2 nucleic acids MAY BE PRESENT.   A presumptive positive result was obtained on the submitted specimen  and confirmed on repeat testing.  While 2019 novel coronavirus  (SARS-CoV-2) nucleic acids may be present in the submitted sample  additional confirmatory testing may be necessary for epidemiological  and / or clinical management purposes  to differentiate between  SARS-CoV-2 and other Sarbecovirus currently known to infect humans.  If clinically indicated additional testing with an alternate test  methodology [phone]) is advised. The SARS-CoV-2 RNA is generally  detectable in upper and lower respiratory sp ecimens during the acute  phase of infection. The expected result is Negative. Fact Sheet for Patients:  StrictlyIdeas.no Fact Sheet for Healthcare Providers: BankingDealers.co.za This test is not yet approved or cleared by the [place] FDA and has been authorized for detection and/or diagnosis of SARS-CoV-2 by FDA under an Emergency Use Authorization (EUA).  This EUA will remain in effect (meaning this test can be used) for the duration of the COVID-19 declaration under Section 564(b)(1) of the Act, 21 U.S.C. section 360bbb-3(b)(1), unless the authorization is terminated  or revoked sooner. Performed at [facility], [place] [address] [place], [place] [address]   Surgical pcr screen     Status: None   Collection Time: 10/18/18 11:59 PM   Specimen: Nasal Mucosa; Nasal Swab  Result Value Ref Range Status   MRSA, PCR NEGATIVE NEGATIVE Final   Staphylococcus aureus NEGATIVE NEGATIVE Final    Comment: (NOTE) The Xpert SA Assay (FDA approved for NASAL specimens in patients 23 years of age and older), is one component of a comprehensive surveillance program. It is not intended to diagnose infection nor to guide or monitor treatment. Performed at [facility], [place] [address], [place], [place] [address]      Time coordinating discharge: 45 minutes  SIGNED:   Tawni Millers, MD  [facility] 10/23/2018, 9:20 AM

## 2018-10-23 NOTE — Care Management (Signed)
Case manager contacted Glenda [name] at the [facility] concerning patient being discharged today. Glenda [name] States that patient can not return there unitl she has been to rehab at [facility] ([facility] Case manager contacted patient via telephone to discuss and offer choice for facilities. Patient asked that CM contact her son, Glenda [name], [phone]. Case manager called and left message requesting return phone call. Patient says she has been to [facility] but doesn't want to go there now because she has heard they have COVID patients. CM will continue to work on placement. MD updated.      [name], RN Case Manager [phone]

## 2018-10-23 NOTE — Plan of Care (Signed)

## 2018-10-23 NOTE — Care Management Important Message (Signed)
Important Message  Patient Details  Name: FREDRIKA CANBY MRN: [id number] Date of Birth: 1924/05/06   Medicare Important Message Given:  Yes     [name] 10/23/2018, 2:40 PM

## 2018-10-23 NOTE — Progress Notes (Signed)
RN spoke with pt's son at pt's request, [name] via cell [phone]. Son updated to pt care plan moving forward and all questions were answered to family's satisfaction. Both pt and son will continue to be informed as information is made available. Shruthi Northrup Elon Spanner, RN 10/23/2018 6:00 PM

## 2018-10-24 LAB — SARS CORONAVIRUS 2 BY RT PCR (HOSPITAL ORDER, PERFORMED IN ~~LOC~~ HOSPITAL LAB): SARS Coronavirus 2: NEGATIVE

## 2018-10-24 MED ORDER — WHITE PETROLATUM EX OINT
TOPICAL_OINTMENT | CUTANEOUS | Status: AC
  Administered 2018-10-24: 07:00:00
  Filled 2018-10-24: qty 28.35

## 2018-10-24 MED ORDER — TEMAZEPAM 15 MG PO CAPS: 15.0000 mg | ORAL_CAPSULE | Freq: Every day | ORAL | 0 refills | Status: DC

## 2018-10-24 NOTE — NC FL2 (Signed)
[place] LEVEL OF CARE SCREENING TOOL     IDENTIFICATION  Patient Name: Glenda [name] Birthdate: 1924-05-12 Sex: female Admission Date (Current Location): 10/18/2018  [facility] and [place] Number:  Herbalist and Address:  The . [facility], [place] [address] [place], [place] [address]      Provider Number:    Attending Physician Name and Address:  Tawni Millers,*  Relative Name and Phone Number:  [name]    [phone]    Current Level of Care: Hospital Recommended Level of Care: [place] Prior Approval Number:    Date Approved/Denied:   PASRR Number: [id number]  Discharge Plan: SNF    Current Diagnoses: Patient Active Problem List   Diagnosis Date Noted  . Hyponatremia 10/19/2018  . Femur fracture, right ([place]) 10/18/2018  . Hip fracture ([place]) 10/18/2018  . Anxiety 08/08/2018  . Abdominal hernia 05/19/2018  . Chronic pain syndrome 02/09/2018  . Chronic narcotic use 08/11/2017  . Malignant neoplasm of upper-outer quadrant of left breast in female, estrogen receptor positive ([place]) 06/27/2017  . History of cardioversion 08/20/2016  . Atrial flutter ([place])   . Shortness of breath 12/13/2014  . Palpitations 12/13/2014  . Constipation 11/06/2012  . Accelerated/malignant hypertension 09/14/2012  . Aortic stenosis, mild 07/11/2012  . Chronic diastolic HF (heart failure) ([place]) 07/11/2012  . Osteoarthritis 04/03/2012  . Anemia 06/10/2011  . Essential hypertension 03/25/2011  . Insomnia 09/15/2010  . Hypercholesterolemia 09/15/2010    Orientation RESPIRATION BLADDER Height & Weight     Self, Time, Situation, Place  Normal Continent Weight: 55 kg Height:  5\' 1"  (154.9 cm)  BEHAVIORAL SYMPTOMS/MOOD NEUROLOGICAL BOWEL NUTRITION STATUS      Continent Diet  AMBULATORY STATUS COMMUNICATION OF NEEDS Skin   Extensive Assist Verbally Normal                       Personal Care Assistance  Level of Assistance  Bathing, Dressing, Feeding Bathing Assistance: Limited assistance Feeding assistance: Limited assistance Dressing Assistance: Limited assistance     Functional Limitations Info  Hearing(hearing aides right and left ear)   Hearing Info: Impaired(hearing aids)      SPECIAL CARE FACTORS FREQUENCY  PT (By licensed PT), OT (By licensed OT)     PT Frequency: 5/weekly OT Frequency: 5/weekly            Contractures Contractures Info: Not present    Additional Factors Info  Code Status Code Status Info: DNR             Current Medications (10/24/2018):  This is the current hospital active medication list Current Facility-Administered Medications  Medication Dose Route Frequency Provider Last Rate Last Dose  . amLODipine (NORVASC) tablet 5 mg  5 mg Oral Daily Erle Crocker, MD   5 mg at 10/24/18 0945  . anastrozole (ARIMIDEX) tablet 1 mg  1 mg Oral Daily Erle Crocker, MD   1 mg at 10/24/18 1027  . apixaban (ELIQUIS) tablet 2.5 mg  2.5 mg Oral BID Erle Crocker, MD   2.5 mg at 10/24/18 0945  . benzonatate (TESSALON) capsule 100 mg  100 mg Oral Q8H PRN Erle Crocker, MD      . cholecalciferol (VITAMIN D3) tablet 1,000 Units  1,000 Units Oral Daily Erle Crocker, MD   1,000 Units at 10/24/18 0945  . docusate sodium (COLACE) capsule 100 mg  100 mg Oral BID Erle Crocker,  MD   100 mg at 10/24/18 0944  . feeding supplement (ENSURE ENLIVE) (ENSURE ENLIVE) liquid 237 mL  237 mL Oral BID BM Erle Crocker, MD   237 mL at 10/24/18 1028  . furosemide (LASIX) tablet 30 mg  30 mg Oral Daily Erle Crocker, MD   30 mg at 10/24/18 0944  . HYDROcodone-acetaminophen (NORCO/VICODIN) 5-325 MG per tablet 1-2 tablet  1-2 tablet Oral Q6H PRN Erle Crocker, MD   2 tablet at 10/24/18 0529  . morphine 2 MG/ML injection 0.5 mg  0.5 mg Intravenous Q2H PRN Erle Crocker, MD      . ondansetron [facility]) tablet 4 mg  4 mg  Oral Q6H PRN Erle Crocker, MD   4 mg at 10/22/18 1519  . pantoprazole (PROTONIX) EC tablet 40 mg  40 mg Oral Daily Erle Crocker, MD   40 mg at 10/24/18 0945  . polyethylene glycol (MIRALAX / GLYCOLAX) packet 17 g  17 g Oral Daily Erle Crocker, MD   17 g at 10/24/18 0945  . sertraline (ZOLOFT) tablet 25 mg  25 mg Oral Daily Erle Crocker, MD   25 mg at 10/24/18 0945  . temazepam (RESTORIL) capsule 15 mg  15 mg Oral QHS Erle Crocker, MD   15 mg at 10/23/18 2042     Discharge Medications: Please see discharge summary for a list of discharge medications.  Relevant Imaging Results:  Relevant Lab Results:   Additional Information 264 [address] [place]

## 2018-10-24 NOTE — Evaluation (Signed)
Occupational Therapy Evaluation Patient Details Name: Glenda [name] MRN: [id number] DOB: November 27, 1924 Today's Date: 10/24/2018    History of Present Illness Pt is a 85 y/o female s/p closed reduction with perc screw fixation of R femoral neck fracture after falling. PMH including but not limited to CHF, HTN, breast cancer s/p mastectomy in 2019, R TKA and R kyphoplasty in 2014.   Clinical Impression   Pt with decline in function and safety with ADLs and ADL mobility with impaired strength, balance and endurance. Pt lives at [facility] and reports that she used a RW for mobility and was independent with ADLS/selfcare. Pt   Currently required mod - min A + 2 for mobility for safety and multimodal cues for sequencing and safety using RW; max A with LB ADLs. Pt would benefit from acute OT services to maximize level of function and safety    Follow Up Recommendations  SNF    Equipment Recommendations  Other (comment)(TBD at next venue of care)    Recommendations for Other Services       Precautions / Restrictions Precautions Precautions: Fall Restrictions Weight Bearing Restrictions: Yes RLE Weight Bearing: Weight bearing as tolerated      Mobility Bed Mobility Overal bed mobility: Needs Assistance Bed Mobility: Supine to Sit Rolling: Min guard   Supine to sit: Min guard     General bed mobility comments: pt able to come to EOB with head elevated and use of rail but needed specific instructions for sequencing. Used BUE's to assist RLE  Transfers Overall transfer level: Needs assistance Equipment used: Rolling walker (2 wheeled) Transfers: Sit to/from Stand Sit to Stand: +2 safety/equipment;Min assist Stand pivot transfers: Mod assist;+2 physical assistance       General transfer comment: mod instructional cues, min A for support    Balance Overall balance assessment: Needs assistance Sitting-balance support: Feet supported;Bilateral upper extremity  supported Sitting balance-Leahy Scale: Fair Sitting balance - Comments: pt bracing with bilateral UEs but able to sit EOB with close min guard   Standing balance support: Bilateral upper extremity supported;During functional activity Standing balance-Leahy Scale: Poor Standing balance comment: heavy reliance on bilateral UEs                           ADL either performed or assessed with clinical judgement   ADL Overall ADL's : Needs assistance/impaired Eating/Feeding: Independent;Sitting   Grooming: Wash/dry hands;Wash/dry face;Standing;Min guard   Upper Body Bathing: Supervision/ safety;Set up;Sitting   Lower Body Bathing: Maximal assistance   Upper Body Dressing : Supervision/safety;Set up;Sitting   Lower Body Dressing: Maximal assistance   Toilet Transfer: Moderate assistance;Minimal assistance;+2 for safety/equipment;Ambulation;RW;Grab bars;Cueing for safety;Cueing for sequencing   Toileting- Clothing Manipulation and Hygiene: Moderate assistance;Sit to/from stand       Functional mobility during ADLs: Moderate assistance;Minimal assistance;+2 for safety/equipment       Vision Baseline Vision/History: Wears glasses Patient Visual Report: No change from baseline       Perception     Praxis      Pertinent Vitals/Pain Pain Assessment: Faces Faces Pain Scale: Hurts even more Pain Location: R hip Pain Descriptors / Indicators: Grimacing;Guarding;Sore Pain Intervention(s): Limited activity within patient's tolerance;Monitored during session;Repositioned     Hand Dominance Right   Extremity/Trunk Assessment Upper Extremity Assessment Upper Extremity Assessment: Generalized weakness   Lower Extremity Assessment Lower Extremity Assessment: Defer to PT evaluation       Communication Communication Communication: No difficulties  Cognition Arousal/Alertness: Awake/alert Behavior During Therapy: WFL for tasks assessed/performed Overall Cognitive  Status: Within Functional Limits for tasks assessed                                     General Comments  pt very motivated, very pleasant and cooperative    Exercises    Shoulder Instructions      Home Living Family/patient expects to be discharged to:: Assisted living                             Home Equipment: Walker - 4 wheels          Prior Functioning/Environment Level of Independence: Independent with assistive device(s)    ADL's / Homemaking Assistance Needed: lives at [facility] and reports that hse was independent with selfcare   Comments: ambulates with rollator        OT Problem List: Decreased strength;Decreased activity tolerance;Impaired balance (sitting and/or standing);Decreased knowledge of use of DME or AE;Pain      OT Treatment/Interventions: Self-care/ADL training;DME and/or AE instruction;Therapeutic activities;Therapeutic exercise;Patient/family education    OT Goals(Current goals can be found in the care plan section) Acute Rehab OT Goals Patient Stated Goal: return to independence OT Goal Formulation: With patient Time For Goal Achievement: 11/07/18 Potential to Achieve Goals: Good ADL Goals Pt Will Perform Grooming: with supervision;with set-up;standing Pt Will Perform Upper Body Bathing: with set-up;with modified independence Pt Will Perform Lower Body Bathing: with mod assist Pt Will Perform Upper Body Dressing: with set-up;with modified independence Pt Will Transfer to Toilet: with min assist;ambulating;grab bars Pt Will Perform Toileting - Clothing Manipulation and hygiene: with min assist;sit to/from stand  OT Frequency: Min 2X/week   Barriers to D/C: Decreased caregiver support          Co-evaluation PT/OT/SLP Co-Evaluation/Treatment: Yes Reason for Co-Treatment: For patient/therapist safety PT goals addressed during session: Mobility/safety with mobility;Balance;Proper use of DME;Strengthening/ROM OT goals  addressed during session: ADL's and self-care;Proper use of Adaptive equipment and DME      AM-PAC OT "6 Clicks" Daily Activity     Outcome Measure Help from another person eating meals?: None Help from another person taking care of personal grooming?: A Little Help from another person toileting, which includes using toliet, bedpan, or urinal?: A Lot Help from another person bathing (including washing, rinsing, drying)?: A Lot Help from another person to put on and taking off regular upper body clothing?: A Little Help from another person to put on and taking off regular lower body clothing?: A Lot 6 Click Score: 16   End of Session Equipment Utilized During Treatment: Gait belt;Rolling walker Nurse Communication: Mobility status  Activity Tolerance: Patient tolerated treatment well Patient left: in chair;with call bell/phone within reach;with chair alarm set  OT Visit Diagnosis: Unsteadiness on feet (R26.81);Other abnormalities of gait and mobility (R26.89);Muscle weakness (generalized) (M62.81);History of falling (Z91.81);Pain Pain - Right/Left: Right Pain - part of body: Hip;Leg                Time: 2458-0998 OT Time Calculation (min): 43 min Charges:  OT General Charges $OT Visit: 1 Visit OT Evaluation $OT Eval Moderate Complexity: 1 Mod    Britt Bottom 10/24/2018, 1:08 PM

## 2018-10-24 NOTE — Progress Notes (Signed)
Report was given to Alex at [facility] at 3:36 PM . All questions were answered and pt belongings were gathered. Awaiting PTAR.  Evin Loiseau Elon Spanner, RN 10/24/2018 3:38 PM

## 2018-10-24 NOTE — TOC Progression Note (Signed)
Transition of Care [facility]) - Progression Note    Patient Details  Name: AASIYAH AUERBACH MRN: [id number] Date of Birth: [birth year]  Transition of Care [facility]) CM/SW Contact  Ninfa Meeker, RN Phone Number: [phone] (working remotely) 10/24/2018, 11:08 AM  Clinical Narrative:   Case manager spoke with patient's son, Leafy Ro, this morning- [phone]. He accepted bed at [facility] for his mom. Case manager called patient with this information. She will have a private room as she requested. Case manager spoke with Levada Dy at [facility] concerning patient coming. She says that patient will need a repeat negative  COVID test,  CM contacted MD and requested order be entered for repeat test. Bedside RN made aware also. Case manager will fax FL2, and DC summary to [facility].    Expected Discharge Plan: [place]    Expected Discharge Plan and Services Expected Discharge Plan: [place]   Discharge Planning Services: CM Consult, Other - See comment(SNF placement)   Living arrangements for the past 2 months: [facility] Expected Discharge Date: 10/24/18               DME Arranged: N/A         HH Arranged: NA           Social Determinants of Health (SDOH) Interventions    Readmission Risk Interventions No flowsheet data found.

## 2018-10-24 NOTE — TOC Transition Note (Signed)
Transition of Care [facility]) - CM/SW Discharge Note   Patient Details  Name: Glenda [name] MRN: [id number] Date of Birth: Oct 29, 1924   Patient will DC to: [facility] Anticipated DC date: 10/24/18 Family notified: Glenda [name], Glenda [name] [phone] Transport By: Glenda [name]   Transition of Care [facility]) CM/SW Contact:  Glenda Meeker, RN Phone Number: [phone] (working remotely) 10/24/2018, 2:57 PM   Clinical Narrative:     Per MD patient is ready for discharge to [facility]. Bedside RN, patient's [name] and facility have been notified of discharge plan. Discharge summary sent to facility. RN to call report to [phone], patient going to room 306. PTAR called at 2:50pm, there are a few transports ahead of patient.    Final next level of care: [place]     Patient Goals and CMS Choice Patient states their goals for this hospitalization and ongoing recovery are:: get better CMS Medicare.gov Compare Post Acute Care list provided to:: Other (Comment Required) Choice offered to / list presented to : Patient, Adult Children(Glenda [name] [phone])  Discharge Placement                       Discharge Plan and Services   Discharge Planning Services: CM Consult, Other - See comment(SNF placement)            DME Arranged: N/A         HH Arranged: NA          Social Determinants of Health (SDOH) Interventions     Readmission Risk Interventions No flowsheet data found.

## 2018-10-24 NOTE — Progress Notes (Signed)
Physical Therapy Treatment Patient Details Name: Glenda [name] MRN: [id number] DOB: 1924/06/07 Today's Date: 10/24/2018    History of Present Illness Pt is a 85 y/o female s/p closed reduction with perc screw fixation of R femoral neck fracture after falling. PMH including but not limited to CHF, HTN, breast cancer s/p mastectomy in 2019, R TKA and R kyphoplasty in 2014.    PT Comments    Pt making good progress today. Able to get to EOB with increased time, bed rail, and step by step instructional cues but no hands on assist. Min A for sit<>stand and min A +2 for safety with 10' ambulation. Pt moving very slowly and needs cues for mvmt of RW and each LE. Fatigues very quickly, needed to sit after 10' and managed only 3' after seated rest. PT will continue to follow.   Follow Up Recommendations  SNF;Other (comment)(unless her ALF can provide heavy physical assistance)     Equipment Recommendations  None recommended by PT    Recommendations for Other Services       Precautions / Restrictions Precautions Precautions: Fall Restrictions Weight Bearing Restrictions: Yes RLE Weight Bearing: Weight bearing as tolerated    Mobility  Bed Mobility Overal bed mobility: Needs Assistance Bed Mobility: Supine to Sit Rolling: Min guard   Supine to sit: Min guard     General bed mobility comments: pt able to come to EOB with head elevated and use of rail but needed specific instructions for sequencing. Used BUE's to assist RLE  Transfers Overall transfer level: Needs assistance Equipment used: Rolling walker (2 wheeled) Transfers: Sit to/from Stand Sit to Stand: +2 safety/equipment;Min assist         General transfer comment: mod instructional cues, min A for support  Ambulation/Gait Ambulation/Gait assistance: Min assist;+2 safety/equipment Gait Distance (Feet): 13 Feet(10', 3') Assistive device: Rolling walker (2 wheeled) Gait Pattern/deviations: Step-to pattern;Decreased  weight shift to right;Decreased stance time - right Gait velocity: decreased Gait velocity interpretation: <1.31 ft/sec, indicative of household ambulator General Gait Details: pt had difficulty stepping L foot due to avoidance of wt bearing on R. Very small steps and instructional cues needed for mvmt of RW and each LE. Pt very fatigued by end with tremors noted BLE   Stairs             Wheelchair Mobility    Modified Rankin (Stroke Patients Only)       Balance Overall balance assessment: Needs assistance Sitting-balance support: Feet supported;Bilateral upper extremity supported Sitting balance-Leahy Scale: Fair     Standing balance support: Bilateral upper extremity supported Standing balance-Leahy Scale: Poor Standing balance comment: heavy reliance on bilateral UEs                            Cognition Arousal/Alertness: Awake/alert Behavior During Therapy: WFL for tasks assessed/performed Overall Cognitive Status: Within Functional Limits for tasks assessed                                        Exercises General Exercises - Lower Extremity Ankle Circles/Pumps: AROM;Both;10 reps;Supine Quad Sets: AROM;Both;10 reps;Supine Long Arc Quad: AROM;Right;10 reps;Seated Heel Slides: AROM;Right;10 reps;Supine    General Comments General comments (skin integrity, edema, etc.): pt very motivated      Pertinent Vitals/Pain Pain Assessment: Faces Faces Pain Scale: Hurts even more Pain Location: R hip Pain  Descriptors / Indicators: Grimacing;Guarding;Sore Pain Intervention(s): Limited activity within patient's tolerance;Monitored during session    Home Living Family/patient expects to be discharged to:: Assisted living             Home Equipment: Walker - 4 wheels      Prior Function Level of Independence: Independent with assistive device(s)      Comments: ambulates with rollator   PT Goals (current goals can now be found in  the care plan section) Acute Rehab PT Goals Patient Stated Goal: return to independence PT Goal Formulation: With patient Time For Goal Achievement: 11/03/18 Potential to Achieve Goals: Good Progress towards PT goals: Progressing toward goals    Frequency    Min 2X/week      PT Plan Current plan remains appropriate    Co-evaluation PT/OT/SLP Co-Evaluation/Treatment: Yes Reason for Co-Treatment: For patient/therapist safety PT goals addressed during session: Mobility/safety with mobility;Balance;Proper use of DME;Strengthening/ROM        AM-PAC PT "6 Clicks" Mobility   Outcome Measure  Help needed turning from your back to your side while in a flat bed without using bedrails?: A Little Help needed moving from lying on your back to sitting on the side of a flat bed without using bedrails?: A Little Help needed moving to and from a bed to a chair (including a wheelchair)?: A Little Help needed standing up from a chair using your arms (e.g., wheelchair or bedside chair)?: A Lot Help needed to walk in hospital room?: A Lot Help needed climbing 3-5 steps with a railing? : A Lot 6 Click Score: 15    End of Session Equipment Utilized During Treatment: Gait belt Activity Tolerance: Patient tolerated treatment well Patient left: in chair;with call bell/phone within reach;with chair alarm set Nurse Communication: Mobility status PT Visit Diagnosis: Other abnormalities of gait and mobility (R26.89);Pain Pain - Right/Left: Right Pain - part of body: Hip     Time: 7673-4193 PT Time Calculation (min) (ACUTE ONLY): 47 min  Charges:  $Gait Training: 8-22 mins $Therapeutic Exercise: 8-22 mins                     Leighton Roach, PT  Acute Rehab Services  Pager [phone] Office [place] 10/24/2018, 12:52 PM

## 2018-10-24 NOTE — Progress Notes (Signed)
Patient has remained hemodynamically stable, continue to have right hip pain that is controlled with analgesics.  Tolerating p.o. diet adequately.  Temperature 98.1, blood pressure 123/52, heart rate 55, oxygen saturation 98%.  Her lungs are clear to auscultation, heart S1-S2 present and rhythmic, abdomen soft, no lower extremity edema.  Patient is medically stable to transfer to skilled nursing facility.  SARS COVID 19 test screen will be performed before discharge

## 2018-11-07 ENCOUNTER — Ambulatory Visit: Payer: Medicare Other | Admitting: Family Medicine

## 2018-11-14 ENCOUNTER — Other Ambulatory Visit: Payer: Self-pay | Admitting: Family Medicine

## 2018-11-14 NOTE — Telephone Encounter (Signed)
Patient called in to see if she could get a call back from a CMA to ask a few questions about some of the medications she is taking. Please advise.

## 2018-11-14 NOTE — Telephone Encounter (Signed)
Called and spoke with patient. She has been taking Hydrocodone-APAP and was wondering if Dr. Yong Channel will still be filling that for her. She was recently in the hospital, 10/18/18, for a fall and then went to rehab so she hasn't been able to come in for an appointment. Her last visit was 08/08/2018. The staff where she is staying has told her that Dr. Yong Channel wants her to wean off of the medication but she says that she and Dr. Yong Channel never discussed that. She just wanted to touch base with Dr. Yong Channel to see if he will continue refilling the medication and to make sure that she is taking it correctly. She is uncertain if refills were provided by hospital staff.   Forwarding to Dr. Yong Channel to advise.

## 2018-11-14 NOTE — Telephone Encounter (Signed)
I had not formally plan to wean medication- at her age obviously would be great if we could wean the medication but this was not a formal part of prior recommendations- only pills that she have remaining?  Does she have enough refills to get her to September 15 appointment- if not, could potentially do a phone visit-though video would be preferred if her facility can help her.

## 2018-11-15 NOTE — Telephone Encounter (Signed)
Called pt and left VM to call the office.  

## 2018-11-15 NOTE — Telephone Encounter (Signed)
Attempted to call pt, no answer no VM

## 2018-11-16 NOTE — Telephone Encounter (Signed)
Dosing from last office visit from me for hydrocodone "Take 1 tablet by mouth 2 (two) times a day. At 9 AM and 3 pm. separate by 6 hours from temazepam."

## 2018-11-16 NOTE — Telephone Encounter (Signed)
FYI Spoke to pt and advise her of Dr. Ansel Bong note. Pt wanted to make sure that her dosage is right. Pt states they give her 1 tab in the am and 1 tab in the pm prn. Pt is unaware if she has enough to last since she does not handle medication. She was advised that someone usually calls our office when Rx is needed if it is handled by Dr. Yong Channel.

## 2018-11-17 MED ORDER — HYDROCODONE-ACETAMINOPHEN 5-325 MG PO TABS: 1.0000 | ORAL_TABLET | Freq: Four times a day (QID) | ORAL | 0 refills | Status: DC | PRN

## 2018-11-17 NOTE — Addendum Note (Signed)
Addended by: Jasper Loser on: 11/17/2018 05:20 PM   Modules accepted: Orders

## 2018-11-17 NOTE — Telephone Encounter (Signed)
Looks like last refill was 10/23/18 #10/0 from the hospital. I am unable to check database.   Forwarding to Dr. Yong Channel

## 2018-11-20 ENCOUNTER — Ambulatory Visit: Payer: Medicare Other | Admitting: Cardiology

## 2018-12-05 ENCOUNTER — Encounter: Payer: Self-pay | Admitting: Family Medicine

## 2018-12-05 ENCOUNTER — Ambulatory Visit (INDEPENDENT_AMBULATORY_CARE_PROVIDER_SITE_OTHER): Payer: Medicare Other | Admitting: Family Medicine

## 2018-12-05 VITALS — Temp 97.6°F | Ht 61.0 in | Wt 119.0 lb

## 2018-12-05 DIAGNOSIS — S72009A Fracture of unspecified part of neck of unspecified femur, initial encounter for closed fracture: Secondary | ICD-10-CM

## 2018-12-05 DIAGNOSIS — Z8781 Personal history of (healed) traumatic fracture: Secondary | ICD-10-CM | POA: Diagnosis not present

## 2018-12-05 DIAGNOSIS — F1190 Opioid use, unspecified, uncomplicated: Secondary | ICD-10-CM

## 2018-12-05 DIAGNOSIS — G894 Chronic pain syndrome: Secondary | ICD-10-CM

## 2018-12-05 DIAGNOSIS — I4892 Unspecified atrial flutter: Secondary | ICD-10-CM

## 2018-12-05 DIAGNOSIS — W19XXXA Unspecified fall, initial encounter: Secondary | ICD-10-CM

## 2018-12-05 DIAGNOSIS — I5032 Chronic diastolic (congestive) heart failure: Secondary | ICD-10-CM

## 2018-12-05 DIAGNOSIS — I10 Essential (primary) hypertension: Secondary | ICD-10-CM

## 2018-12-05 DIAGNOSIS — M159 Polyosteoarthritis, unspecified: Secondary | ICD-10-CM | POA: Diagnosis not present

## 2018-12-05 DIAGNOSIS — G4700 Insomnia, unspecified: Secondary | ICD-10-CM

## 2018-12-05 MED ORDER — HYDROCODONE-ACETAMINOPHEN 5-325 MG PO TABS: 1.0000 | ORAL_TABLET | Freq: Four times a day (QID) | ORAL | 0 refills | Status: DC | PRN

## 2018-12-05 NOTE — Patient Instructions (Signed)
Health Maintenance Due  Topic Date Due  . INFLUENZA VACCINE  10/21/2018    Depression screen [facility] 2/9 08/08/2018 05/18/2018 07/11/2017  Decreased Interest 0 0 0  Down, Depressed, Hopeless 0 0 0  PHQ - 2 Score 0 0 0  Altered sleeping 0 0 -  Tired, decreased energy 0 0 -  Change in appetite 0 0 -  Feeling bad or failure about yourself  0 0 -  Trouble concentrating 0 0 -  Moving slowly or fidgety/restless 0 0 -  Suicidal thoughts 0 0 -  PHQ-9 Score 0 0 -  Difficult doing work/chores - Not difficult at all -  Some recent data might be hidden

## 2018-12-05 NOTE — Progress Notes (Signed)
Phone [phone]   Subjective:  Virtual visit via phonenote Chief Complaint  Patient presents with   Hospitalization Follow-up    This visit type was conducted due to national recommendations for restrictions regarding the COVID-19 Pandemic (e.g. social distancing).  This format is felt to be most appropriate for this patient at this time balancing risks to patient and risks to population by having him in for in person visit.  All issues noted in this document were discussed and addressed.  No physical exam was performed (except for noted visual exam or audio findings with Telehealth visits).  The patient has consented to conduct a Telehealth visit and understands insurance will be billed.   Our team/I connected with Glenda [name] at  2:40 PM EDT by phone (patient did not have equipment for webex) and verified that I am speaking with the correct person using two identifiers.  Location patient: [facility] Location provider: [facility], office Persons participating in the virtual visit:  patient  Time on phone: 21 minutes Counseling provided about COVID-19 frustrations, frustration about hip fracture, discussing insomnia  Our team/I discussed the limitations of evaluation and management by telemedicine and the availability of in person appointments. In light of current covid-19 pandemic, patient also understands that we are trying to protect them by minimizing in office contact if at all possible.  The patient expressed consent for telemedicine visit and agreed to proceed. Patient understands insurance will be billed.   ROS-continued right hip pain.  Continued low back pain.  No fever or chills or cough reported.  No new calf pain or swelling reported  Past Medical History-  Patient Active Problem List   Diagnosis Date Noted   Chronic pain syndrome 02/09/2018    Priority: High   Chronic narcotic use 08/11/2017    Priority: High   Malignant neoplasm of upper-outer quadrant of left  breast in female, estrogen receptor positive ([place]) 06/27/2017    Priority: High   Atrial flutter ([place])     Priority: High   Chronic diastolic HF (heart failure) ([place]) 07/11/2012    Priority: High   Osteoarthritis 04/03/2012    Priority: High   Abdominal hernia 05/19/2018    Priority: Medium   Shortness of breath 12/13/2014    Priority: Medium   Aortic stenosis, mild 07/11/2012    Priority: Medium   Essential hypertension 03/25/2011    Priority: Medium   Insomnia 09/15/2010    Priority: Medium   Hypercholesterolemia 09/15/2010    Priority: Medium   History of cardioversion 08/20/2016    Priority: Low   Palpitations 12/13/2014    Priority: Low   Constipation 11/06/2012    Priority: Low   Accelerated/malignant hypertension 09/14/2012    Priority: Low   Anemia 06/10/2011    Priority: Low   Hyponatremia 10/19/2018   Femur fracture, right ([place]) 10/18/2018   Hip fracture ([place]) 10/18/2018   Anxiety 08/08/2018    Medications- reviewed and updated Current Outpatient Medications  Medication Sig Dispense Refill   amLODipine (NORVASC) 5 MG tablet Take 1 tablet (5 mg total) by mouth daily. 90 tablet 3   anastrozole (ARIMIDEX) 1 MG tablet Take 1 tablet (1 mg total) by mouth daily. 90 tablet 3   apixaban (ELIQUIS) 2.5 MG TABS tablet Take 1 tablet (2.5 mg total) by mouth 2 (two) times daily. 60 tablet 4   benzonatate (TESSALON) 100 MG capsule Take 100 mg by mouth every 8 (eight) hours as needed for cough.     Cholecalciferol 1000 units  tablet Take 1,000 Units by mouth daily.     Cranberry 425 MG CAPS Take 425 mg by mouth 2 (two) times daily.     docusate sodium (COLACE) 100 MG capsule Take 100 mg by mouth 2 (two) times daily.     furosemide (LASIX) 20 MG tablet Take 1.5 tablets (30 mg total) by mouth daily. 45 tablet 3   HYDROcodone-acetaminophen (NORCO/VICODIN) 5-325 MG tablet Take 1 tablet by mouth every 6 (six) hours as needed for moderate pain (may fill  12/17/2018). 60 tablet 0   losartan (COZAAR) 100 MG tablet Take 1 tablet (100 mg total) by mouth daily. 90 tablet 3   Multiple Vitamins-Minerals (CEROVITE ADVANCED FORMULA PO) Take 1 tablet by mouth daily. (0800)     omeprazole (PRILOSEC) 40 MG capsule Take 40 mg by mouth daily.     ondansetron (ZOFRAN) 4 MG tablet Take 4 mg by mouth every 6 (six) hours as needed for nausea.     polyethylene glycol (MIRALAX / GLYCOLAX) packet Take 17 g by mouth daily. Taken  Daily Monday to Saturday.     sertraline (ZOLOFT) 25 MG tablet TAKE (1) TABLET BY MOUTH ONCE DAILY. (Patient taking differently: Take 25 mg by mouth daily. TAKE (1) TABLET BY MOUTH ONCE DAILY.) 30 tablet 3   temazepam (RESTORIL) 15 MG capsule Take 1 capsule (15 mg total) by mouth at bedtime. 10 capsule 0   HYDROcodone-acetaminophen (NORCO/VICODIN) 5-325 MG tablet Take 1 tablet by mouth every 6 (six) hours as needed for moderate pain or severe pain (may fill 01/15/2019). 60 tablet 0   No current facility-administered medications for this visit.      Objective:  Temp 97.6 F (36.4 C)    Ht 5\' 1"  (1.549 m)    Wt 119 lb (54 kg)    BMI 22.48 kg/m  self reported vitals  Nonlabored voice, normal speech      Assessment and Plan   # Fall/hip fracture #Insomnia S:Seen in the ED on 10/18/18 after mechanical fall. C/o severe R hip pain after fall. She was ambulating with walker trying to get up at night and go to bathroom, lost her balance walker went out in front of her and fell on R side. No head trauma.  Had several scans done and dx with R hip fx. Underwent closed reduction with percutaneous pinning with [facility] Dr. Lucia Gaskins. Transferred to inpatient rehab for PT and skilled nursing for 3 days.   Since pt has been home, things have not been 100%. She had f/u with surgeon yesterday, report that fracture does not seem to be healing well- we are still waiting on report. She is seeing Dr. Lucia Gaskins. She has started home health at the  facility where she lives. Ortho told them that she does not need to be ambulating or weight bearing. All her therapy is in her chair A/P: Thankfully patient has not had increased demand for pain medicine despite hip fracture- I refilled her hydrocodone which we have used long-term for her severe arthritic pains for another 2 months-recommended 31-month follow-up to see how she is doing.  See insomnia section-we considered changing medications but she wants to wait until hip fracture seems to be stabilizing and pain improving there before changing meds   #Hypertension S: Compliant with losartan 100 mg, amlodipine 5 mg (gets edema even on nonfull dose), Lasix 30 mg. Trial low dose zoloft for anxiety portion early 2020  Home monitoring- reports controlled vast majority of the time-did not have it  checked today through her facility A/P:  Stable. Continue current medications.      #Osteoarthritis/insomnia S: Patient with continued pain particularly in her low back-also now in her hip after hip fracture.  Also some finger pain.    She seems to do okay at rest as long as hydrocodone is available.  She uses hydrocodone twice a day and spaces last dose from 6 hours from temazepam to decrease fall risk.  Temazepam remains effective for sleep issues/insomnia- we tried to change to 7.5mg  dose but was very expensive so have opted to continue 15mg  dose. Her facility has trazodone listed as allergy so we have opted out A/P: Refills for arthritic pain as above   we discussed with her fall how temazepam may have increased the risk of fall- I would like to retry trazodone but she is not sure what her prior allergy was-she is going to check with her facility and we will discuss at 20-month follow-up  -Last UDS May 2019-we will need to repeat next in office visit - [place] -Controlled substance contract May 2019  #Chronic diastolic heart failure S: Compliant with Lasix 30 mg.  Hesitant to increase amlodipine  due to heart failure.  Edema- no reported increase.  Weight- no reported increase A/P: Stable. Continue current medications.    #Atrial flutter S:Patient compliant with Eliquis.  Cardioversion 2017 and appears to stay in normal sinus rhythm A/P: I am hopeful the Eliquis prevent stroke but also reduces risk of DVT/PE given immobility at present  # Anxiety S:patient started on zoloft  25 mg in February 2020- she reports improvement in BP on this (she would get anxious before BP checks) A/P: We will continue low-dose of Zoloft for now  Recommended follow up: 2 months follow-up recommended-consider transition to trazodone depending on what her allergy list Future Appointments  Date Time Provider [place]  02/19/2019 11:00 AM Lelon Perla, MD [facility]  10/15/2019 11:00 AM Nicholas Lose, MD [facility] None    Lab/Order associations:   ICD-10-CM   1. Fall, initial encounter  W19.XXXA   2. S/P right hip fracture  Z87.81   3. Chronic pain syndrome  G89.4   4. Chronic narcotic use  F11.90   5. Atrial flutter, unspecified type ([place])  I48.92   6. Chronic diastolic HF (heart failure) ([facility])  I50.32   7. Osteoarthritis of multiple joints, unspecified osteoarthritis type  M15.9   8. Essential hypertension  I10   9. Insomnia, unspecified type  G47.00     Meds ordered this encounter  Medications   HYDROcodone-acetaminophen (NORCO/VICODIN) 5-325 MG tablet    Sig: Take 1 tablet by mouth every 6 (six) hours as needed for moderate pain (may fill 12/17/2018).    Dispense:  60 tablet    Refill:  0   HYDROcodone-acetaminophen (NORCO/VICODIN) 5-325 MG tablet    Sig: Take 1 tablet by mouth every 6 (six) hours as needed for moderate pain or severe pain (may fill 01/15/2019).    Dispense:  60 tablet    Refill:  0    Return precautions advised.  Garret Reddish, MD

## 2018-12-05 NOTE — Progress Notes (Deleted)
Phone [phone]   Subjective:  Glenda [name] is a 85 y.o. year old very pleasant female patient who presents for transitional care management and hospital follow up for fall. Patient was hospitalized from 10/18/2018 to 10/23/2018. A TCM phone call was completed on ***. Medical complexity ***   ***   See problem oriented charting as well ROS- ***   Past Medical History-  Patient Active Problem List   Diagnosis Date Noted  . Hyponatremia 10/19/2018  . Femur fracture, right ([place]) 10/18/2018  . Hip fracture ([place]) 10/18/2018  . Anxiety 08/08/2018  . Abdominal hernia 05/19/2018  . Chronic pain syndrome 02/09/2018  . Chronic narcotic use 08/11/2017  . Malignant neoplasm of upper-outer quadrant of left breast in female, estrogen receptor positive ([place]) 06/27/2017  . History of cardioversion 08/20/2016  . Atrial flutter ([place])   . Shortness of breath 12/13/2014  . Palpitations 12/13/2014  . Constipation 11/06/2012  . Accelerated/malignant hypertension 09/14/2012  . Aortic stenosis, mild 07/11/2012  . Chronic diastolic HF (heart failure) ([place]) 07/11/2012  . Osteoarthritis 04/03/2012  . Anemia 06/10/2011  . Essential hypertension 03/25/2011  . Insomnia 09/15/2010  . Hypercholesterolemia 09/15/2010    Medications- reviewed and updated  A medical reconciliation was performed comparing current medicines to hospital discharge medications. Current Outpatient Medications  Medication Sig Dispense Refill  . amLODipine (NORVASC) 5 MG tablet Take 1 tablet (5 mg total) by mouth daily. 90 tablet 3  . anastrozole (ARIMIDEX) 1 MG tablet Take 1 tablet (1 mg total) by mouth daily. 90 tablet 3  . apixaban (ELIQUIS) 2.5 MG TABS tablet Take 1 tablet (2.5 mg total) by mouth 2 (two) times daily. 60 tablet 4  . benzonatate (TESSALON) 100 MG capsule Take 100 mg by mouth every 8 (eight) hours as needed for cough.    . Cholecalciferol 1000 units tablet Take 1,000 Units by mouth daily.    . Cranberry  425 MG CAPS Take 425 mg by mouth 2 (two) times daily.    Marland Kitchen docusate sodium (COLACE) 100 MG capsule Take 100 mg by mouth 2 (two) times daily.    . furosemide (LASIX) 20 MG tablet Take 1.5 tablets (30 mg total) by mouth daily. 45 tablet 3  . HYDROcodone-acetaminophen (NORCO/VICODIN) 5-325 MG tablet Take 1 tablet by mouth every 6 (six) hours as needed for moderate pain. 60 tablet 0  . losartan (COZAAR) 100 MG tablet Take 1 tablet (100 mg total) by mouth daily. 90 tablet 3  . Multiple Vitamins-Minerals (CEROVITE ADVANCED FORMULA PO) Take 1 tablet by mouth daily. (0800)    . omeprazole (PRILOSEC) 40 MG capsule Take 40 mg by mouth daily.    . ondansetron (ZOFRAN) 4 MG tablet Take 4 mg by mouth every 6 (six) hours as needed for nausea.    . polyethylene glycol (MIRALAX / GLYCOLAX) packet Take 17 g by mouth daily. Taken  Daily Monday to Saturday.    . sertraline (ZOLOFT) 25 MG tablet TAKE (1) TABLET BY MOUTH ONCE DAILY. (Patient taking differently: Take 25 mg by mouth daily. TAKE (1) TABLET BY MOUTH ONCE DAILY.) 30 tablet 3  . temazepam (RESTORIL) 15 MG capsule Take 1 capsule (15 mg total) by mouth at bedtime. 10 capsule 0   No current facility-administered medications for this visit.    Objective  Objective:  There were no vitals taken for this visit. Gen: NAD, resting comfortably CV: RRR no murmurs rubs or gallops Lungs: CTAB no crackles, wheeze, rhonchi Abdomen: soft/nontender/nondistended/normal bowel sounds. No rebound or  guarding.  Ext: no edema Skin: warm, dry Neuro: grossly normal, moves all extremities  ***   Assessment and Plan:   # Fall S:Seen in the ED on 10/18/18 after mechanical fall. C/o severe R hip pain after fall. She was ambulating with walker, lost her balance and fell on R side. No head trauma.  Had several scans done and dx with R hip fx. Underwent closed reduction with percutaneous pinning. Transferred to inpatient rehab for PT and skilled nursing for 3 days.   Since pt  has been home, things have not been 100%. She had f/u with surgeon yesterday, fracture does not been to be healing well. She is seeing Dr. Lucia Gaskins. She has started home health at the facility where she lives. Ortho told them that she does not need to be ambulating or weight bearing.   A/P: ***     No problem-specific Assessment & Plan notes found for this encounter.   Recommended follow up: *** Future Appointments  Date Time Provider [place]  12/05/2018  2:40 PM Marin Olp, MD [facility]  02/19/2019 11:00 AM Lelon Perla, MD [facility]  10/15/2019 11:00 AM Nicholas Lose, MD [facility] None    Lab/Order associations: No diagnosis found.  No orders of the defined types were placed in this encounter.   Return precautions advised.  Jasper Loser, CMA

## 2018-12-15 ENCOUNTER — Ambulatory Visit: Payer: Medicare Other | Admitting: Family Medicine

## 2018-12-21 ENCOUNTER — Other Ambulatory Visit: Payer: Self-pay | Admitting: Orthopedic Surgery

## 2018-12-22 ENCOUNTER — Telehealth: Payer: Self-pay

## 2018-12-22 NOTE — Telephone Encounter (Signed)
Request for surgical clearance:  1. What type of surgery is being performed? RIGHT TOTAL HIP ARTHROPLASTY ANTERIOR APPROACH AND REMOVAL OF SCREWS   2. When is this surgery scheduled? 01-15-2019   3. What type of clearance is required (medical clearance vs. Pharmacy clearance to hold med vs. Both)? BOTH  4. Are there any medications that need to be held prior to surgery and how long? NOT LISTED-PT TAKES ELIQUIS 2.5MG BID   5. Practice name and name of physician performing surgery?   [facility] Len[facility] Simmer, MD ATTN: [facility]   6. What is your office phone number [phone]    7.   What is your office fax number [phone]  8.   Anesthesia type (None, local, MAC, general) ? SPINAL

## 2018-12-25 NOTE — Telephone Encounter (Signed)
   Primary Cardiologist: Kirk Ruths, MD  Chart reviewed as part of pre-operative protocol coverage. Patient was contacted 12/25/2018 in reference to pre-operative risk assessment for pending surgery as outlined below.  Glenda [name] was last seen on 10/19/2018 in pre-operative consultation by Dr. Cathie Olden. At that time, she was easily performing 4 METS without angina. Based on the revised cardiac risk index, she had a score of 1 for CHF history with a 6% risk of adverse cardiac events in the perioperative setting and therefore she was deemed an acceptable risk for surgery without further cardiac workup.  Patient with diagnosis of atrial flutter on Eliquis for anticoagulation with a CHADS2-VASc score of  5 (CHF, HTN, AGE,AGE, female)  Per office protocol, patient can hold Eliquis for 3 days prior to procedure.    Therefore, based on ACC/AHA guidelines, the patient would be at acceptable risk for the planned procedure without further cardiovascular testing.   I will route this recommendation to the requesting party via Epic fax function and remove from pre-op pool.  Please call with questions.  Kathyrn Drown, NP 12/25/2018, 8:53 AM

## 2018-12-25 NOTE — Telephone Encounter (Signed)
Patient with diagnosis of aflutter on Eliquis for anticoagulation.    Procedure: RIGHT TOTAL HIP ARTHROPLASTY ANTERIOR APPROACH AND REMOVAL OF SCREWS  Date of procedure: 01/15/2019  CHADS2-VASc score of  5 (CHF, HTN, AGE,AGE, female)  CrCl 30 ml/min   Per office protocol, patient can hold Eliquis for 3 days prior to procedure.

## 2018-12-28 ENCOUNTER — Inpatient Hospital Stay: Payer: Medicare Other | Admitting: Family Medicine

## 2018-12-29 NOTE — Patient Instructions (Signed)
DUE TO COVID-19 ONLY ONE VISITOR IS ALLOWED TO COME WITH YOU AND STAY IN THE WAITING ROOM ONLY DURING PRE OP AND PROCEDURE DAY OF SURGERY. THE 1 VISITOR MAY VISIT WITH YOU AFTER SURGERY IN YOUR PRIVATE ROOM DURING VISITING HOURS ONLY!  YOU NEED TO HAVE A COVID 19 TEST ON  10/12______ @_______ , THIS TEST MUST BE DONE BEFORE SURGERY, COME  [address] , [id number].  ([place]) ONCE YOUR COVID TEST IS COMPLETED, PLEASE BEGIN THE QUARANTINE INSTRUCTIONS AS OUTLINED IN YOUR HANDOUT.                [name]    Your procedure is scheduled on: 01/15/19   Report to [facility] Main  Entrance   Report to admitting at  2:20 pm     Call this number if you have problems the morning of surgery [phone]   . BRUSH YOUR TEETH MORNING OF SURGERY AND RINSE YOUR MOUTH OUT, NO CHEWING GUM CANDY OR MINTS .  Do not eat food After Midnight.      CLEAR LIQUID DIET   Foods Allowed                                                                     Foods Excluded  Coffee and tea, regular and decaf                             liquids that you cannot  Plain Jell-O any favor except red or purple                                           see through such as: Fruit ices (not with fruit pulp)                                     milk, soups, orange juice  Iced Popsicles                                    All solid food Carbonated beverages, regular and diet                                    Cranberry, grape and apple juices Sports drinks like Gatorade Lightly seasoned clear broth or consume(fat free) Sugar, honey syrup    YOU MAY HAVE CLEAR LIQUIDS FROM MIDNIGHT UNTIL 2:00 pm.   At 2:00 pm Please finish the prescribed Pre-Surgery  drink  . Nothing by mouth after you finish the  drink !   Take these medicines the morning of surgery with A SIP OF WATER: ZOLOFT, AMLODIPINE ,PRILOSEC ,ANASTROZOLE                                 You may not have any  metal  on your body including hair pins and              piercings            Do not wear jewelry, make-up, lotions, powders or perfumes, deodorant             Do not wear nail polish on your fingernails.  Do not shave  48 hours prior to surgery.               Do not bring valuables to the hospital. [place].  Contacts, dentures or bridgework may not be worn into surgery.  Leave suitcase in the car. After surgery it may be brought to your room.       Name and phone number of your driver:  Special Instructions: N/A              Please read over the following fact sheets you were given: _____________________________________________________________________             [facility] - Preparing for Surgery  Before surgery, you can play an important role.   Because skin is not sterile, your skin needs to be as free of germs as possible.   You can reduce the number of germs on your skin by washing with CHG (chlorahexidine gluconate) soap before surgery.   CHG is an antiseptic cleaner which kills germs and bonds with the skin to continue killing germs even after washing. Please DO NOT use if you have an allergy to CHG or antibacterial soaps .  If your skin becomes reddened/irritated stop using the CHG and inform your nurse when you arrive at Short Stay. Do not shave (including legs and underarms) for at least 48 hours prior to the first CHG shower.   Please follow these instructions carefully:  1.  Shower with CHG Soap the night before surgery and the  morning of Surgery.  2.  If you choose to wash your hair, wash your hair first as usual with your  normal  shampoo.  3.  After you shampoo, rinse your hair and body thoroughly to remove the  shampoo.                                        4.  Use CHG as you would any other liquid soap.  You can apply chg directly  to the skin and wash                       Gently with a scrungie or clean  washcloth.  5.  Apply the CHG Soap to your body ONLY FROM THE NECK DOWN.   Do not use on face/ open                           Wound or open sores. Avoid contact with eyes, ears mouth and genitals (private parts).                       Wash face,  Genitals (private parts) with your normal soap.             6.  Wash thoroughly, paying special attention to the area where your surgery  will be  performed.  7.  Thoroughly rinse your body with warm water from the neck down.  8.  DO NOT shower/wash with your normal soap after using and rinsing off  the CHG Soap.             9.  Pat yourself dry with a clean towel.            10.  Wear clean pajamas.            11.  Place clean sheets on your bed the night of your first shower and do not  sleep with pets. Day of Surgery : Do not apply any lotions/deodorants the morning of surgery.  Please wear clean clothes to the hospital/surgery center.  FAILURE TO FOLLOW THESE INSTRUCTIONS MAY RESULT IN THE CANCELLATION OF YOUR SURGERY PATIENT SIGNATURE_________________________________  NURSE SIGNATURE__________________________________  ________________________________________________________________________   Adam Phenix  An incentive spirometer is a tool that can help keep your lungs clear and active. This tool measures how well you are filling your lungs with each breath. Taking long deep breaths may help reverse or decrease the chance of developing breathing (pulmonary) problems (especially infection) following:  A long period of time when you are unable to move or be active. BEFORE THE PROCEDURE   If the spirometer includes an indicator to show your best effort, your nurse or respiratory therapist will set it to a desired goal.  If possible, sit up straight or lean slightly forward. Try not to slouch.  Hold the incentive spirometer in an upright position. INSTRUCTIONS FOR USE  1. Sit on the edge of your bed if possible, or sit up as far as  you can in bed or on a chair. 2. Hold the incentive spirometer in an upright position. 3. Breathe out normally. 4. Place the mouthpiece in your mouth and seal your lips tightly around it. 5. Breathe in slowly and as deeply as possible, raising the piston or the ball toward the top of the column. 6. Hold your breath for 3-5 seconds or for as long as possible. Allow the piston or ball to fall to the bottom of the column. 7. Remove the mouthpiece from your mouth and breathe out normally. 8. Rest for a few seconds and repeat Steps 1 through 7 at least 10 times every 1-2 hours when you are awake. Take your time and take a few normal breaths between deep breaths. 9. The spirometer may include an indicator to show your best effort. Use the indicator as a goal to work toward during each repetition. 10. After each set of 10 deep breaths, practice coughing to be sure your lungs are clear. If you have an incision (the cut made at the time of surgery), support your incision when coughing by placing a pillow or rolled up towels firmly against it. Once you are able to get out of bed, walk around indoors and cough well. You may stop using the incentive spirometer when instructed by your caregiver.  RISKS AND COMPLICATIONS  Take your time so you do not get dizzy or light-headed.  If you are in pain, you may need to take or ask for pain medication before doing incentive spirometry. It is harder to take a deep breath if you are having pain. AFTER USE  Rest and breathe slowly and easily.  It can be helpful to keep track of a log of your progress. Your caregiver can provide you with a simple table to help with this. If you are using the  spirometer at home, follow these instructions: [place] IF:   You are having difficultly using the spirometer.  You have trouble using the spirometer as often as instructed.  Your pain medication is not giving enough relief while using the spirometer.  You develop  fever of 100.5 F (38.1 C) or higher. SEEK IMMEDIATE MEDICAL CARE IF:   You cough up bloody sputum that had not been present before.  You develop fever of 102 F (38.9 C) or greater.  You develop worsening pain at or near the incision site. MAKE SURE YOU:   Understand these instructions.  Will watch your condition.  Will get help right away if you are not doing well or get worse. Document Released: 07/19/2006 Document Revised: 05/31/2011 Document Reviewed: 09/19/2006 [facility] Patient Information 2014 [place], [place].   ________________________________________________________________________

## 2019-01-08 ENCOUNTER — Encounter (HOSPITAL_COMMUNITY)
Admission: RE | Admit: 2019-01-08 | Discharge: 2019-01-08 | Disposition: A | Discharge status: Home / Self Care | Payer: Medicare Other | Source: Ambulatory Visit | Attending: Family Medicine | Admitting: Family Medicine

## 2019-01-09 ENCOUNTER — Telehealth: Payer: Self-pay | Admitting: Family Medicine

## 2019-01-09 NOTE — Telephone Encounter (Signed)
See note

## 2019-01-09 NOTE — Telephone Encounter (Signed)
[facility] sent over a surgical clearance request on 10.01.20 and is calling about the status of this request / please fax back to [phone]

## 2019-01-11 ENCOUNTER — Other Ambulatory Visit: Payer: Self-pay | Admitting: Family Medicine

## 2019-01-15 NOTE — Telephone Encounter (Signed)
Looks like cardiology clearance was received should not need one from PCP.

## 2019-01-16 NOTE — Telephone Encounter (Signed)
Pt called to return call. Please advise.  

## 2019-01-16 NOTE — Telephone Encounter (Signed)
Left message to return call to our office.  

## 2019-01-16 NOTE — Telephone Encounter (Signed)
Called to request a surgical clearance from the PCP, which she stated that the doctor is requesting both from cardiologist and PCP.  Please call to discuss.  CB# [phone]

## 2019-01-16 NOTE — Telephone Encounter (Signed)
I agree with Glenda [name]-the primaryconcern with surgery  is cardiac risk and since she has cardiology clearance-she may proceed with surgery.   I feel like I submitted this previously. I do not have a copy of prior form-if they want to send this back I am willing to sign from medical perspective.

## 2019-01-16 NOTE — Telephone Encounter (Signed)
See note

## 2019-01-16 NOTE — Telephone Encounter (Signed)
Left message on voicemail to call office.  

## 2019-01-16 NOTE — Telephone Encounter (Signed)
Dr. Yong Channel do you want in office visit or virtual for surgical clearance.

## 2019-01-24 ENCOUNTER — Other Ambulatory Visit: Payer: Self-pay | Admitting: Orthopedic Surgery

## 2019-01-24 NOTE — Care Plan (Signed)
Spoke with patient and Carla Drape at [facility]  [phone]) prior to surgery. Patient will return to her room at the ALF post surgery. She will have meals brought to her and therapy in her room. The staff will assist with ADLs and do room checks every 30 minutes. She also has a call bell in the room if needed. Due to COVID-19 restrictions no family is allowed to come and stay with her in the facility. The patient feels comfortable with this plan. She has equipment at home. Patient and MD in agreement with plan.  Ladell Heads, [place]

## 2019-01-30 ENCOUNTER — Other Ambulatory Visit: Payer: Self-pay

## 2019-01-30 ENCOUNTER — Encounter (HOSPITAL_COMMUNITY)
Admission: RE | Admit: 2019-01-30 | Discharge: 2019-01-30 | Disposition: A | Discharge status: Home / Self Care | Payer: Medicare Other | Source: Ambulatory Visit | Attending: Orthopedic Surgery | Admitting: Orthopedic Surgery

## 2019-01-30 ENCOUNTER — Encounter (HOSPITAL_COMMUNITY): Payer: Self-pay

## 2019-01-30 DIAGNOSIS — Z01812 Encounter for preprocedural laboratory examination: Secondary | ICD-10-CM | POA: Diagnosis not present

## 2019-01-30 DIAGNOSIS — E7800 Pure hypercholesterolemia, unspecified: Secondary | ICD-10-CM | POA: Insufficient documentation

## 2019-01-30 DIAGNOSIS — M5136 Other intervertebral disc degeneration, lumbar region: Secondary | ICD-10-CM | POA: Diagnosis not present

## 2019-01-30 DIAGNOSIS — K219 Gastro-esophageal reflux disease without esophagitis: Secondary | ICD-10-CM | POA: Insufficient documentation

## 2019-01-30 DIAGNOSIS — I4892 Unspecified atrial flutter: Secondary | ICD-10-CM | POA: Diagnosis not present

## 2019-01-30 DIAGNOSIS — Z7901 Long term (current) use of anticoagulants: Secondary | ICD-10-CM | POA: Diagnosis not present

## 2019-01-30 DIAGNOSIS — I480 Paroxysmal atrial fibrillation: Secondary | ICD-10-CM | POA: Insufficient documentation

## 2019-01-30 DIAGNOSIS — Z79899 Other long term (current) drug therapy: Secondary | ICD-10-CM | POA: Insufficient documentation

## 2019-01-30 DIAGNOSIS — I5032 Chronic diastolic (congestive) heart failure: Secondary | ICD-10-CM | POA: Insufficient documentation

## 2019-01-30 DIAGNOSIS — I110 Hypertensive heart disease with heart failure: Secondary | ICD-10-CM | POA: Diagnosis not present

## 2019-01-30 HISTORY — DX: Cardiac arrhythmia, unspecified: I49.9

## 2019-01-30 LAB — CBC WITH DIFFERENTIAL/PLATELET
Abs Immature Granulocytes: 0.02 10*3/uL (ref 0.00–0.07)
Basophils Absolute: 0 10*3/uL (ref 0.0–0.1)
Basophils Relative: 1 %
Eosinophils Absolute: 0.1 10*3/uL (ref 0.0–0.5)
Eosinophils Relative: 2 %
HCT: 41.3 % (ref 36.0–46.0)
Hemoglobin: 13.2 g/dL (ref 12.0–15.0)
Immature Granulocytes: 0 %
Lymphocytes Relative: 26 %
Lymphs Abs: 1.5 10*3/uL (ref 0.7–4.0)
MCH: 29.5 pg (ref 26.0–34.0)
MCHC: 32 g/dL (ref 30.0–36.0)
MCV: 92.2 fL (ref 80.0–100.0)
Monocytes Absolute: 0.5 10*3/uL (ref 0.1–1.0)
Monocytes Relative: 8 %
Neutro Abs: 3.8 10*3/uL (ref 1.7–7.7)
Neutrophils Relative %: 63 %
Platelets: 236 10*3/uL (ref 150–400)
RBC: 4.48 MIL/uL (ref 3.87–5.11)
RDW: 13.5 % (ref 11.5–15.5)
WBC: 6 10*3/uL (ref 4.0–10.5)
nRBC: 0 % (ref 0.0–0.2)

## 2019-01-30 LAB — URINALYSIS, ROUTINE W REFLEX MICROSCOPIC
Bilirubin Urine: NEGATIVE
Glucose, UA: NEGATIVE mg/dL
Hgb urine dipstick: NEGATIVE
Ketones, ur: NEGATIVE mg/dL
Leukocytes,Ua: NEGATIVE
Nitrite: NEGATIVE
Protein, ur: NEGATIVE mg/dL
Specific Gravity, Urine: 1.012 (ref 1.005–1.030)
pH: 6 (ref 5.0–8.0)

## 2019-01-30 LAB — BASIC METABOLIC PANEL
Anion gap: 10 (ref 5–15)
BUN: 27 mg/dL — ABNORMAL HIGH (ref 8–23)
CO2: 26 mmol/L (ref 22–32)
Calcium: 9.2 mg/dL (ref 8.9–10.3)
Chloride: 98 mmol/L (ref 98–111)
Creatinine, Ser: 0.74 mg/dL (ref 0.44–1.00)
GFR calc Af Amer: >60 mL/min (ref >60)
GFR calc non Af Amer: >60 mL/min (ref >60)
Glucose, Bld: 103 mg/dL — ABNORMAL HIGH (ref 70–99)
Potassium: 4.1 mmol/L (ref 3.5–5.1)
Sodium: 134 mmol/L — ABNORMAL LOW (ref 135–145)

## 2019-01-30 LAB — PROTIME-INR
INR: 1.1 (ref 0.8–1.2)
Prothrombin Time: 14.3 seconds (ref 11.4–15.2)

## 2019-01-30 LAB — SURGICAL PCR SCREEN
MRSA, PCR: NEGATIVE
Staphylococcus aureus: NEGATIVE

## 2019-01-30 LAB — APTT: aPTT: 29 seconds (ref 24–36)

## 2019-01-30 NOTE — Patient Instructions (Addendum)
DUE TO COVID-19 ONLY ONE VISITOR IS ALLOWED TO COME WITH YOU AND STAY IN THE WAITING ROOM ONLY DURING PRE OP AND PROCEDURE DAY OF SURGERY.   THE 1 VISITOR MAY VISIT WITH YOU AFTER SURGERY IN YOUR PRIVATE ROOM DURING VISITING HOURS ONLY!  YOU NEED TO HAVE A COVID 19 TEST ON_11/12______ @_9 :35______, THIS TEST MUST BE DONE BEFORE SURGERY, COME  [address], [place] , [id number].  [facility])  Go to the cement overhang on the building. Do not go to the tent.   ONCE YOUR COVID TEST IS COMPLETED, PLEASE BEGIN THE QUARANTINE INSTRUCTIONS AS OUTLINED IN YOUR HANDOUT.                Glenda [name]    Your procedure is scheduled on: 02/05/19   Report to [facility] Main  Entrance    Report to admitting at  10:45 AM. Then go to Short stay with your belongings           ( your surgery time is 1:15- 2:45) this is subject to change. If there is a change in time we will call you.     Call this number if you have problems the morning of surgery [phone]   . BRUSH YOUR TEETH MORNING OF SURGERY AND RINSE YOUR MOUTH OUT, NO CHEWING GUM CANDY OR MINTS.    Do not eat food After Midnight.   YOU MAY HAVE CLEAR LIQUIDS FROM MIDNIGHT UNTIL 10:00 AM  . At 10:00 AM Please finish the prescribed Pre-Surgery  drink.   Nothing by mouth after you finish the  drink !   Take these medicines the morning of surgery with A SIP OF WATER: Zoloft, Amlodipine, Prilosec, Anastozole                                 You may not have any metal on your body including hair pins and              piercings  Do not wear jewelry, make-up, lotions, powders or perfumes, deodorant              Do not bring valuables to the hospital. [place].  Contacts, dentures or bridgework may not be worn into surgery.       P Special Instructions: N/A              Please read over the following fact sheets you were  given: _____________________________________________________________________             [facility] - Preparing for Surgery  Before surgery, you can play an important role.   Because skin is not sterile, your skin needs to be as free of germs as possible.   You can reduce the number of germs on your skin by washing with CHG (chlorahexidine gluconate) soap before surgery.   CHG is an antiseptic cleaner which kills germs and bonds with the skin to continue killing germs even after washing. Please DO NOT use if you have an allergy to CHG or antibacterial soaps .  If your skin becomes reddened/irritated stop using the CHG and inform your nurse when you arrive at Short Stay. Do not shave (including legs and underarms) for at least 48 hours prior to the first CHG shower.    Please  follow these instructions carefully:  1.  Shower with CHG Soap the night before surgery and the  morning of Surgery.  2.  If you choose to wash your hair, wash your hair first as usual with your  normal  shampoo.  3.  After you shampoo, rinse your hair and body thoroughly to remove the  shampoo.                                        4.  Use CHG as you would any other liquid soap.  You can apply chg directly  to the skin and wash                       Gently with a scrungie or clean washcloth.  5.  Apply the CHG Soap to your body ONLY FROM THE NECK DOWN.   Do not use on face/ open                           Wound or open sores. Avoid contact with eyes, ears mouth and genitals (private parts).                       Wash face,  Genitals (private parts) with your normal soap.             6.  Wash thoroughly, paying special attention to the area where your surgery  will be performed.  7.  Thoroughly rinse your body with warm water from the neck down.  8.  DO NOT shower/wash with your normal soap after using and rinsing off  the CHG Soap.             9.  Pat yourself dry with a clean towel.            10.  Wear clean  pajamas.            11.  Place clean sheets on your bed the night of your first shower and do not  sleep with pets. Day of Surgery : Do not apply any lotions/deodorants the morning of surgery.  Please wear clean clothes to the hospital/surgery center.  FAILURE TO FOLLOW THESE INSTRUCTIONS MAY RESULT IN THE CANCELLATION OF YOUR SURGERY PATIENT SIGNATURE_________________________________  NURSE SIGNATURE__________________________________  ________________________________________________________________________   Glenda [name]  An incentive spirometer is a tool that can help keep your lungs clear and active. This tool measures how well you are filling your lungs with each breath. Taking long deep breaths may help reverse or decrease the chance of developing breathing (pulmonary) problems (especially infection) following:  A long period of time when you are unable to move or be active. BEFORE THE PROCEDURE   If the spirometer includes an indicator to show your best effort, your nurse or respiratory therapist will set it to a desired goal.  If possible, sit up straight or lean slightly forward. Try not to slouch.  Hold the incentive spirometer in an upright position. INSTRUCTIONS FOR USE  1. Sit on the edge of your bed if possible, or sit up as far as you can in bed or on a chair. 2. Hold the incentive spirometer in an upright position. 3. Breathe out normally. 4. Place the mouthpiece in your mouth and seal your lips tightly around it. 5. Breathe in slowly and as  deeply as possible, raising the piston or the ball toward the top of the column. 6. Hold your breath for 3-5 seconds or for as long as possible. Allow the piston or ball to fall to the bottom of the column. 7. Remove the mouthpiece from your mouth and breathe out normally. 8. Rest for a few seconds and repeat Steps 1 through 7 at least 10 times every 1-2 hours when you are awake. Take your time and take a few normal breaths  between deep breaths. 9. The spirometer may include an indicator to show your best effort. Use the indicator as a goal to work toward during each repetition. 10. After each set of 10 deep breaths, practice coughing to be sure your lungs are clear. If you have an incision (the cut made at the time of surgery), support your incision when coughing by placing a pillow or rolled up towels firmly against it. Once you are able to get out of bed, walk around indoors and cough well. You may stop using the incentive spirometer when instructed by your caregiver.  RISKS AND COMPLICATIONS  Take your time so you do not get dizzy or light-headed.  If you are in pain, you may need to take or ask for pain medication before doing incentive spirometry. It is harder to take a deep breath if you are having pain. AFTER USE  Rest and breathe slowly and easily.  It can be helpful to keep track of a log of your progress. Your caregiver can provide you with a simple table to help with this. If you are using the spirometer at home, follow these instructions: [place] IF:   You are having difficultly using the spirometer.  You have trouble using the spirometer as often as instructed.  Your pain medication is not giving enough relief while using the spirometer.  You develop fever of 100.5 F (38.1 C) or higher. SEEK IMMEDIATE MEDICAL CARE IF:   You cough up bloody sputum that had not been present before.  You develop fever of 102 F (38.9 C) or greater.  You develop worsening pain at or near the incision site. MAKE SURE YOU:   Understand these instructions.  Will watch your condition.  Will get help right away if you are not doing well or get worse. Document Released: 07/19/2006 Document Revised: 05/31/2011 Document Reviewed: 09/19/2006 [facility] Patient Information 2014 [place], [place].   ________________________________________________________________________

## 2019-01-30 NOTE — Progress Notes (Signed)
PCP - Dr. Ansel Bong Cardiologist - Dr. Stanford Breed  Chest x-ray - no EKG - 10/19/18 Stress Test - no ECHO - no Cardiac Cath - no  Sleep Study - no CPAP -   Fasting Blood Sugar - NA Checks Blood Sugar _____ times a day  Blood Thinner Instructions :Pt is on Eliquis. She was not sure if she received instructions about when to stop her Eliquis prior to surgery. She reported that Dottie at [facility] handles her medications. A call was placed to Dottie [phone]) and a message was left for her to call back.  Aspirin Instructions:NA Last Dose:unknown  Anesthesia review:   Patient denies shortness of breath, fever, cough and chest pain at PAT appointment yes  Patient verbalized understanding of instructions that were given to them at the PAT appointment. Patient was also instructed that they will need to review over the PAT instructions again at home before surgery. Yes A copy of the pre-op instructions was given to the Pt and one for Dottie and one for her son.

## 2019-01-31 NOTE — Progress Notes (Signed)
Anesthesia Chart Review   Case: [id number] Date/Time: 02/05/19 1300   Procedure: RIGH TOTAL HIP ARTHROPLASTY ANTERIOR APPROACH AND REMOVAL OF SCREWS (Right Hip)   Anesthesia type: Spinal   Pre-op diagnosis: RIGHT HIP FEMORAL MECK NON-UNION   Location: [place] [facility] / [facility]   Surgeon: Frederik Pear, MD      DISCUSSION:85 y.o. never smoker with h/o HTN, GERD, hiatal hernia, PAF (on Eliquis), chronic diastolic heart failure, right femoral neck non-union scheduled for above procedure 02/05/2019 with Dr. Frederik Pear.   Cleared by cardiology 12/25/2018.  Per Kathyrn Drown, NP, "[name] was last seen on 10/19/2018 in pre-operative consultation by Dr. Cathie Olden. At that time, she was easily performing 4 METS without angina. Based on the revised cardiac risk index, she had a score of 1 for CHF history with a 6% risk of adverse cardiac events in the perioperative setting and therefore she was deemed an acceptable risk for surgery without further cardiac workup.  Patient with diagnosis ofatrial flutteron Eliquisfor anticoagulation with a CHADS2-VASc score of5(CHF, HTN,AGE,AGE, female)  Per office protocol, patient can holdEliquisfor 3days prior to procedure.   Therefore, based on ACC/AHA guidelines, the patient would be at acceptable risk for the planned procedure without further cardiovascular testing."  Anticipate pt can proceed with planned procedure barring acute status change.   VS: BP (!) 160/72   Pulse 65   Temp 36.9 C (Oral)   Resp 16   Ht 5\' 1"  (1.549 m)   Wt 52.6 kg   SpO2 97%   BMI 21.92 kg/m   PROVIDERS: Marin Olp, MD is PCP   Kirk Ruths, MD is Cardiologist  LABS: Labs reviewed: Acceptable for surgery. (all labs ordered are listed, but only abnormal results are displayed)  Labs Reviewed  BASIC METABOLIC PANEL - Abnormal; Notable for the following components:      Result Value   Sodium 134 (*)    Glucose, Bld 103 (*)    BUN 27 (*)    All other  components within normal limits  SURGICAL PCR SCREEN  APTT  CBC WITH DIFFERENTIAL/PLATELET  PROTIME-INR  URINALYSIS, ROUTINE W REFLEX MICROSCOPIC  TYPE AND SCREEN     IMAGES:   EKG: 10/19/2018 Rate 62 bpm Sinus rhythm  Short PR interval  Right axis deviation  Low voltage, extremity leads Consider left ventricular hypertrophy Probable lateral infarct, age indeterminate Nonspecific T abnormalities, lateral leads  CV: Echo 01/14/2016 Study Conclusions  - Left ventricle: The cavity size was normal. There was mild   concentric hypertrophy. Systolic function was normal. The   estimated ejection fraction was in the range of 55% to 60%. - Aortic valve: There was mild regurgitation. - Mitral valve: There was mild regurgitation. - Left atrium: The atrium was mildly dilated. - Atrial septum: No defect or patent foramen ovale was identified. Past Medical History:  Diagnosis Date  . Aortic stenosis    Echo 02/2011 showing mild AS with normal LV systolic function  . Arthritis    "lower back" (10/09/2015)  . Atrial flutter with rapid ventricular response ([place]) 08/19/2016   s/p successful DCCV on 08/20/16, continue eliquis  . Chronic bronchitis ([place])    "off and on; several years" (10/09/2015)  . Chronic diastolic CHF (congestive heart failure) ([place])    a. 12/2015 Echo: EF 55-60%, mild AI/MR, mildly dil LA.  Marland Kitchen Chronic lower back pain   . Complication of anesthesia    "they had trouble waking me up after colon resection"  .  Confusion    Originally listed as TIA-pt denies this hx on 10/09/2015 "it was the [place] I was taking; they had thought I was having a st originally roke"  . DDD (degenerative disc disease), lumbar    severe facet dz and adv DDD MRI L spine 2009  . Diverticulosis   . Dysrhythmia    A-Fib  . Femur fracture, right ([place]) 10/18/2018  . GERD (gastroesophageal reflux disease)   . Hiatal hernia    hx  . Hypercholesterolemia   . Hypertension   . Insomnia   . OA  (osteoarthritis) of knee   . Paroxysmal atrial flutter ([place])    a. 09/2015 s/p TEE/DCCV;  b. CHA2DS2VASc = 5-->Eliquis.  . Peripheral edema   . PVCs (premature ventricular contractions)   . Sacral fracture ([place]) 11/06/2012  . Vertigo 11/19/2012    Past Surgical History:  Procedure Laterality Date  . BREAST BIOPSY Left   . CARDIAC CATHETERIZATION  02/17/2001   MILD REGURGITATION. EF 60%  . CARDIOVERSION N/A 10/08/2015   Procedure: CARDIOVERSION;  Surgeon: Sanda Klein, MD;  Location: [place]OSCOPY;  Service: Cardiovascular;  Laterality: N/A;  . CARDIOVERSION N/A 08/20/2016   Procedure: Cardioversion;  Surgeon: Evans Lance, MD;  Location: [place] CV LAB;  Service: Cardiovascular;  Laterality: N/A;  . CATARACT EXTRACTION W/ INTRAOCULAR LENS  IMPLANT, BILATERAL Bilateral   . CESAREAN SECTION  1954  . COLECTOMY     related to "blockage"  . COLONOSCOPY    . EXCISIONAL HEMORRHOIDECTOMY    . HIP PINNING,CANNULATED Right 10/19/2018   Procedure: Right percutaneous hip pinning;  Surgeon: Erle Crocker, MD;  Location: [place];  Service: Orthopedics;  Laterality: Right;  . INGUINAL HERNIA REPAIR Bilateral   . KYPHOPLASTY Right 09/11/2012   Procedure: Right Acrylic Sacroplasty;  Surgeon: Kristeen Miss, MD;  Location: [place] NEURO ORS;  Service: Neurosurgery;  Laterality: Right;  Right  Acrylic Sacroplasty  . TEE WITHOUT CARDIOVERSION N/A 10/08/2015   Procedure: TRANSESOPHAGEAL ECHOCARDIOGRAM (TEE);  Surgeon: Sanda Klein, MD;  Location: [facility];  Service: Cardiovascular;  Laterality: N/A;  . TOTAL KNEE ARTHROPLASTY Right   . TOTAL MASTECTOMY Left 07/22/2017   Procedure: TOTAL MASTECTOMY;  Surgeon: Excell Seltzer, MD;  Location: [place];  Service: General;  Laterality: Left;  . UMBILICAL HERNIA REPAIR      MEDICATIONS: . acetaminophen (TYLENOL) 500 MG tablet  . amLODipine (NORVASC) 5 MG tablet  . anastrozole (ARIMIDEX) 1 MG tablet  . apixaban (ELIQUIS) 2.5 MG TABS tablet  .  benzonatate (TESSALON) 100 MG capsule  . Cholecalciferol 1000 units tablet  . Cranberry 425 MG CAPS  . docusate sodium (COLACE) 100 MG capsule  . furosemide (LASIX) 20 MG tablet  . HYDROcodone-acetaminophen (NORCO/VICODIN) 5-325 MG tablet  . HYDROcodone-acetaminophen (NORCO/VICODIN) 5-325 MG tablet  . losartan (COZAAR) 100 MG tablet  . Multiple Vitamins-Minerals (CEROVITE ADVANCED FORMULA PO)  . omeprazole (PRILOSEC) 40 MG capsule  . ondansetron (ZOFRAN) 4 MG tablet  . polyethylene glycol (MIRALAX / GLYCOLAX) packet  . sertraline (ZOLOFT) 25 MG tablet  . temazepam (RESTORIL) 15 MG capsule   No current facility-administered medications for this encounter.     Maia Plan [facility] Pre-Surgical Testing [phone] 01/31/19  3:25 PM

## 2019-01-31 NOTE — Anesthesia Preprocedure Evaluation (Addendum)
Anesthesia Evaluation  Patient identified by MRN, date of birth, ID band Patient awake    Reviewed: Allergy & Precautions, NPO status , Patient's Chart, lab work & pertinent test results  History of Anesthesia Complications Negative for: history of anesthetic complications  Airway Mallampati: II  TM Distance: >3 FB Neck ROM: Full    Dental   Pulmonary neg pulmonary ROS,    Pulmonary exam normal        Cardiovascular hypertension, Pt. on medications +CHF  Normal cardiovascular exam+ dysrhythmias Atrial Fibrillation      Neuro/Psych PSYCHIATRIC DISORDERS Anxiety negative neurological ROS     GI/Hepatic Neg liver ROS, hiatal hernia, GERD  Medicated,  Endo/Other  negative endocrine ROS  Renal/GU negative Renal ROS  negative genitourinary   Musculoskeletal  (+) Arthritis ,   Abdominal   Peds  Hematology negative hematology ROS (+)   Anesthesia Other Findings Last dose Eliquis 02/01/19 Plts 236, INR 1.1  Echo 2017: EF 55-60%, mild AI, mild MR  Reproductive/Obstetrics                          Anesthesia Physical Anesthesia Plan  ASA: III  Anesthesia Plan: Spinal   Post-op Pain Management:    Induction:   PONV Risk Score and Plan: 2 and Propofol infusion, Treatment may vary due to age or medical condition, Ondansetron and TIVA  Airway Management Planned: Nasal Cannula and Simple Face Mask  Additional Equipment: None  Intra-op Plan:   Post-operative Plan:   Informed Consent: I have reviewed the patients History and Physical, chart, labs and discussed the procedure including the risks, benefits and alternatives for the proposed anesthesia with the patient or authorized representative who has indicated his/her understanding and acceptance.       Plan Discussed with:   Anesthesia Plan Comments: (See PAT note 01/30/2019, Konrad Felix, PA-C)      Anesthesia Quick  Evaluation

## 2019-02-01 ENCOUNTER — Other Ambulatory Visit (HOSPITAL_COMMUNITY)
Admission: RE | Admit: 2019-02-01 | Discharge: 2019-02-01 | Disposition: A | Discharge status: Home / Self Care | Payer: Medicare Other | Source: Ambulatory Visit | Attending: Orthopedic Surgery | Admitting: Orthopedic Surgery

## 2019-02-01 DIAGNOSIS — Z20828 Contact with and (suspected) exposure to other viral communicable diseases: Secondary | ICD-10-CM | POA: Insufficient documentation

## 2019-02-01 DIAGNOSIS — Z01812 Encounter for preprocedural laboratory examination: Secondary | ICD-10-CM | POA: Insufficient documentation

## 2019-02-01 DIAGNOSIS — M1611 Unilateral primary osteoarthritis, right hip: Secondary | ICD-10-CM | POA: Diagnosis present

## 2019-02-01 NOTE — H&P (Signed)
TOTAL HIP ADMISSION H&P  Patient is admitted for right total hip arthroplasty.  Subjective:  Chief Complaint: right hip pain  HPI: Glenda [name], 85 y.o. female, has a history of pain and functional disability in the right hip(s) due to trauma and arthritis and patient has failed non-surgical conservative treatments for greater than 12 weeks to include NSAID's and/or analgesics, use of assistive devices and activity modification.  Onset of symptoms was gradual starting 1 years ago with rapidlly worsening course since that time.The patient noted prior procedures of the hip to include hip pinning on the right hip(s).  Patient currently rates pain in the right hip at 10 out of 10 with activity. Patient has night pain, worsening of pain with activity and weight bearing, trendelenberg gait, pain that interfers with activities of daily living and pain with passive range of motion. Patient has evidence of joint space narrowing by imaging studies. This condition presents safety issues increasing the risk of falls.   There is no current active infection.  Patient Active Problem List   Diagnosis Date Noted  . Hyponatremia 10/19/2018  . Femur fracture, right ([place]) 10/18/2018  . Hip fracture ([place]) 10/18/2018  . Anxiety 08/08/2018  . Abdominal hernia 05/19/2018  . Chronic pain syndrome 02/09/2018  . Chronic narcotic use 08/11/2017  . Malignant neoplasm of upper-outer quadrant of left breast in female, estrogen receptor positive ([place]) 06/27/2017  . History of cardioversion 08/20/2016  . Atrial flutter ([place])   . Shortness of breath 12/13/2014  . Palpitations 12/13/2014  . Constipation 11/06/2012  . Accelerated/malignant hypertension 09/14/2012  . Aortic stenosis, mild 07/11/2012  . Chronic diastolic HF (heart failure) ([place]) 07/11/2012  . Osteoarthritis 04/03/2012  . Anemia 06/10/2011  . Essential hypertension 03/25/2011  . Insomnia 09/15/2010  . Hypercholesterolemia 09/15/2010   Past Medical  History:  Diagnosis Date  . Aortic stenosis    Echo 02/2011 showing mild AS with normal LV systolic function  . Arthritis    "lower back" (10/09/2015)  . Atrial flutter with rapid ventricular response ([place]) 08/19/2016   s/p successful DCCV on 08/20/16, continue eliquis  . Chronic bronchitis ([place])    "off and on; several years" (10/09/2015)  . Chronic diastolic CHF (congestive heart failure) ([place])    a. 12/2015 Echo: EF 55-60%, mild AI/MR, mildly dil LA.  Marland Kitchen Chronic lower back pain   . Complication of anesthesia    "they had trouble waking me up after colon resection"  . Confusion    Originally listed as TIA-pt denies this hx on 10/09/2015 "it was the [place] I was taking; they had thought I was having a st originally roke"  . DDD (degenerative disc disease), lumbar    severe facet dz and adv DDD MRI L spine 2009  . Diverticulosis   . Dysrhythmia    A-Fib  . Femur fracture, right ([place]) 10/18/2018  . GERD (gastroesophageal reflux disease)   . Hiatal hernia    hx  . Hypercholesterolemia   . Hypertension   . Insomnia   . OA (osteoarthritis) of knee   . Paroxysmal atrial flutter ([place])    a. 09/2015 s/p TEE/DCCV;  b. CHA2DS2VASc = 5-->Eliquis.  . Peripheral edema   . PVCs (premature ventricular contractions)   . Sacral fracture ([place]) 11/06/2012  . Vertigo 11/19/2012    Past Surgical History:  Procedure Laterality Date  . BREAST BIOPSY Left   . CARDIAC CATHETERIZATION  02/17/2001   MILD REGURGITATION. EF 60%  . CARDIOVERSION N/A 10/08/2015  Procedure: CARDIOVERSION;  Surgeon: Sanda Klein, MD;  Location: [facility] ENDOSCOPY;  Service: Cardiovascular;  Laterality: N/A;  . CARDIOVERSION N/A 08/20/2016   Procedure: Cardioversion;  Surgeon: Evans Lance, MD;  Location: [place] CV LAB;  Service: Cardiovascular;  Laterality: N/A;  . CATARACT EXTRACTION W/ INTRAOCULAR LENS  IMPLANT, BILATERAL Bilateral   . CESAREAN SECTION  1954  . COLECTOMY     related to "blockage"  . COLONOSCOPY    .  EXCISIONAL HEMORRHOIDECTOMY    . HIP PINNING,CANNULATED Right 10/19/2018   Procedure: Right percutaneous hip pinning;  Surgeon: Erle Crocker, MD;  Location: [place];  Service: Orthopedics;  Laterality: Right;  . INGUINAL HERNIA REPAIR Bilateral   . KYPHOPLASTY Right 09/11/2012   Procedure: Right Acrylic Sacroplasty;  Surgeon: Kristeen Miss, MD;  Location: [place] NEURO ORS;  Service: Neurosurgery;  Laterality: Right;  Right  Acrylic Sacroplasty  . TEE WITHOUT CARDIOVERSION N/A 10/08/2015   Procedure: TRANSESOPHAGEAL ECHOCARDIOGRAM (TEE);  Surgeon: Sanda Klein, MD;  Location: [facility] ENDOSCOPY;  Service: Cardiovascular;  Laterality: N/A;  . TOTAL KNEE ARTHROPLASTY Right   . TOTAL MASTECTOMY Left 07/22/2017   Procedure: TOTAL MASTECTOMY;  Surgeon: Excell Seltzer, MD;  Location: [place];  Service: General;  Laterality: Left;  . UMBILICAL HERNIA REPAIR      No current facility-administered medications for this encounter.    Current Outpatient Medications  Medication Sig Dispense Refill Last Dose  . acetaminophen (TYLENOL) 500 MG tablet Take 1,000 mg by mouth every 6 (six) hours as needed for moderate pain.     Marland Kitchen amLODipine (NORVASC) 5 MG tablet Take 1 tablet (5 mg total) by mouth daily. 90 tablet 3   . anastrozole (ARIMIDEX) 1 MG tablet Take 1 tablet (1 mg total) by mouth daily. 90 tablet 3   . apixaban (ELIQUIS) 2.5 MG TABS tablet Take 1 tablet (2.5 mg total) by mouth 2 (two) times daily. 60 tablet 4   . benzonatate (TESSALON) 100 MG capsule Take 100 mg by mouth every 8 (eight) hours as needed for cough.     . Cholecalciferol 1000 units tablet Take 1,000 Units by mouth daily.     . Cranberry 425 MG CAPS Take 425 mg by mouth 2 (two) times daily.     Marland Kitchen docusate sodium (COLACE) 100 MG capsule Take 100 mg by mouth 2 (two) times daily.     . furosemide (LASIX) 20 MG tablet Take 1.5 tablets (30 mg total) by mouth daily. 45 tablet 3   . HYDROcodone-acetaminophen (NORCO/VICODIN) 5-325 MG tablet Take 1  tablet by mouth every 6 (six) hours as needed for moderate pain or severe pain (may fill 01/15/2019). 60 tablet 0   . losartan (COZAAR) 100 MG tablet Take 1 tablet (100 mg total) by mouth daily. 90 tablet 3   . Multiple Vitamins-Minerals (CEROVITE ADVANCED FORMULA PO) Take 1 tablet by mouth daily. (0800)     . omeprazole (PRILOSEC) 40 MG capsule Take 40 mg by mouth daily.     . ondansetron (ZOFRAN) 4 MG tablet Take 4 mg by mouth every 6 (six) hours as needed for nausea.     . polyethylene glycol (MIRALAX / GLYCOLAX) packet Take 17 g by mouth See admin instructions. Mix 17 grams in 8 ounces of juice/water and drink daily, Monday through Saturday     . sertraline (ZOLOFT) 25 MG tablet TAKE (1) TABLET BY MOUTH ONCE DAILY. (Patient taking differently: Take 25 mg by mouth daily. TAKE (1) TABLET BY MOUTH ONCE DAILY.) 30  tablet 3   . temazepam (RESTORIL) 15 MG capsule TAKE (1) CAPSULE BY MOUTH AT BEDTIME. SHOULD SEPARATE FROM HYDROCODONE BY AT LEAST 6 HOURS. (Patient taking differently: Take 15 mg by mouth at bedtime. Should separate from hydrocodone by at least 6 hours) 30 capsule 5   . HYDROcodone-acetaminophen (NORCO/VICODIN) 5-325 MG tablet Take 1 tablet by mouth every 6 (six) hours as needed for moderate pain (may fill 12/17/2018). (Patient not taking: Reported on 01/23/2019) 60 tablet 0 Not Taking at Unknown time   Allergies  Allergen Reactions  . Ace Inhibitors Other (See Comments)    Cough  . Halcion [Triazolam]     UNSPECIFIED REACTION   . Pentazocine Lactate     UNSPECIFIED REACTION   . Sulfa Drugs Cross Reactors     UNSPECIFIED REACTION   . Trazodone And Nefazodone Other (See Comments)    Per MAR  . Amitriptyline Other (See Comments)    Hyperactivity  . Avelox [Moxifloxacin Hcl In Nacl] Other (See Comments)    dizziness  . Penicillins Rash    Social History   Tobacco Use  . Smoking status: Never Smoker  . Smokeless tobacco: Never Used  Substance Use Topics  . Alcohol use: No     Family History  Problem Relation Age of Onset  . Heart disease Mother   . Hypertension Father   . Other Sister        15 in 2019  . Other Sister        45 in 2019  . Heart disease Sister   . Heart attack Neg Hx   . Stroke Neg Hx      Review of Systems  Constitutional: Negative.   HENT: Positive for hearing loss.   Eyes: Negative.   Respiratory: Negative.   Cardiovascular: Positive for palpitations.       HTN  Gastrointestinal: Negative.   Genitourinary: Negative.   Musculoskeletal: Positive for joint pain.  Skin: Negative.   Neurological: Negative.   Endo/Heme/Allergies: Negative.   Psychiatric/Behavioral: Negative.     Objective:  Physical Exam  Constitutional: She is oriented to person, place, and time. She appears well-developed and well-nourished.  HENT:  Head: Normocephalic and atraumatic.  Eyes: Pupils are equal, round, and reactive to light.  Neck: Normal range of motion. Neck supple.  Cardiovascular: Intact distal pulses.  Respiratory: Effort normal.  Musculoskeletal:        General: Tenderness present.     Comments: The surgical scars where the pins are placed or straight lateral there are well-healed she is tender over this site.  She has pain with internal rotation of the right hip the right lower extremities 1/2 inch shorter than the left side.  She is neurovascular intact distally.  Neurological: She is alert and oriented to person, place, and time.  Skin: Skin is warm and dry.  Psychiatric: She has a normal mood and affect. Her behavior is normal. Judgment and thought content normal.    Vital signs in last 24 hours:    Labs:   Estimated body mass index is 21.92 kg/m as calculated from the following:   Height as of 01/30/19: 5\' 1"  (1.549 m).   Weight as of 01/30/19: 52.6 kg.   Imaging Review Plain radiographs demonstrate  AP pelvis and crosstable lateral of the right hip showing that the femoral head has collapsed down the right lower  extremity is 1/2 inch short and the pins are protruding laterally.     Assessment/Plan:  End stage arthritis,  right hip(s)  The patient history, physical examination, clinical judgement of the provider and imaging studies are consistent with end stage degenerative joint disease of the right hip(s) and total hip arthroplasty is deemed medically necessary. The treatment options including medical management, injection therapy, arthroscopy and arthroplasty were discussed at length. The risks and benefits of total hip arthroplasty were presented and reviewed. The risks due to aseptic loosening, infection, stiffness, dislocation/subluxation,  thromboembolic complications and other imponderables were discussed.  The patient acknowledged the explanation, agreed to proceed with the plan and consent was signed. Patient is being admitted for inpatient treatment for surgery, pain control, PT, OT, prophylactic antibiotics, VTE prophylaxis, progressive ambulation and ADL's and discharge planning.The patient is planning to be discharged assisted living    Patient's anticipated LOS is less than 2 midnights, meeting these requirements: - Younger than 4 - Lives within 1 hour of care - Has a competent adult at home to recover with post-op recover - NO history of  - Chronic pain requiring opiods  - Diabetes  - Coronary Artery Disease  - Heart failure  - Heart attack  - Stroke  - DVT/VTE  - Cardiac arrhythmia  - Respiratory Failure/COPD  - Renal failure  - Anemia  - Advanced Liver disease

## 2019-02-02 LAB — NOVEL CORONAVIRUS, NAA (HOSP ORDER, SEND-OUT TO REF LAB; TAT 18-24 HRS): SARS-CoV-2, NAA: NOT DETECTED

## 2019-02-02 NOTE — Progress Notes (Signed)
Surgical time change called to Pt.  Last dose of Eliquis was 02/02/19

## 2019-02-04 MED ORDER — TRANEXAMIC ACID 1000 MG/10ML IV SOLN
2000.0000 mg | INTRAVENOUS | Status: DC
  Filled 2019-02-04: qty 20

## 2019-02-04 MED ORDER — BUPIVACAINE LIPOSOME 1.3 % IJ SUSP
10.0000 mL | Freq: Once | INTRAMUSCULAR | Status: DC
  Filled 2019-02-04: qty 10

## 2019-02-05 ENCOUNTER — Inpatient Hospital Stay (HOSPITAL_COMMUNITY): Payer: Medicare Other | Admitting: Physician Assistant

## 2019-02-05 ENCOUNTER — Encounter (HOSPITAL_COMMUNITY): Payer: Self-pay

## 2019-02-05 ENCOUNTER — Encounter (HOSPITAL_COMMUNITY)
Admission: RE | Disposition: A | Discharge status: Home Health | Payer: Self-pay | Source: Home / Self Care | Attending: Orthopedic Surgery

## 2019-02-05 ENCOUNTER — Inpatient Hospital Stay (HOSPITAL_COMMUNITY): Payer: Medicare Other

## 2019-02-05 ENCOUNTER — Inpatient Hospital Stay (HOSPITAL_COMMUNITY)
Admission: RE | Admit: 2019-02-05 | Discharge: 2019-02-14 | DRG: 467 | Disposition: A | Discharge status: Home Health | Payer: Medicare Other | Attending: Orthopedic Surgery | Admitting: Orthopedic Surgery

## 2019-02-05 ENCOUNTER — Other Ambulatory Visit: Payer: Self-pay

## 2019-02-05 ENCOUNTER — Inpatient Hospital Stay (HOSPITAL_COMMUNITY): Payer: Medicare Other | Admitting: Certified Registered"

## 2019-02-05 DIAGNOSIS — S72001K Fracture of unspecified part of neck of right femur, subsequent encounter for closed fracture with nonunion: Secondary | ICD-10-CM

## 2019-02-05 DIAGNOSIS — G894 Chronic pain syndrome: Secondary | ICD-10-CM | POA: Diagnosis present

## 2019-02-05 DIAGNOSIS — I9589 Other hypotension: Secondary | ICD-10-CM | POA: Diagnosis present

## 2019-02-05 DIAGNOSIS — K219 Gastro-esophageal reflux disease without esophagitis: Secondary | ICD-10-CM | POA: Diagnosis present

## 2019-02-05 DIAGNOSIS — E861 Hypovolemia: Secondary | ICD-10-CM | POA: Diagnosis present

## 2019-02-05 DIAGNOSIS — Z880 Allergy status to penicillin: Secondary | ICD-10-CM

## 2019-02-05 DIAGNOSIS — E119 Type 2 diabetes mellitus without complications: Secondary | ICD-10-CM | POA: Diagnosis present

## 2019-02-05 DIAGNOSIS — I340 Nonrheumatic mitral (valve) insufficiency: Secondary | ICD-10-CM | POA: Diagnosis not present

## 2019-02-05 DIAGNOSIS — I4892 Unspecified atrial flutter: Secondary | ICD-10-CM | POA: Diagnosis not present

## 2019-02-05 DIAGNOSIS — I2510 Atherosclerotic heart disease of native coronary artery without angina pectoris: Secondary | ICD-10-CM | POA: Diagnosis present

## 2019-02-05 DIAGNOSIS — Z20828 Contact with and (suspected) exposure to other viral communicable diseases: Secondary | ICD-10-CM | POA: Diagnosis present

## 2019-02-05 DIAGNOSIS — Z882 Allergy status to sulfonamides status: Secondary | ICD-10-CM

## 2019-02-05 DIAGNOSIS — Z419 Encounter for procedure for purposes other than remedying health state, unspecified: Secondary | ICD-10-CM

## 2019-02-05 DIAGNOSIS — Z853 Personal history of malignant neoplasm of breast: Secondary | ICD-10-CM

## 2019-02-05 DIAGNOSIS — R071 Chest pain on breathing: Secondary | ICD-10-CM | POA: Diagnosis not present

## 2019-02-05 DIAGNOSIS — I9581 Postprocedural hypotension: Secondary | ICD-10-CM | POA: Diagnosis not present

## 2019-02-05 DIAGNOSIS — F329 Major depressive disorder, single episode, unspecified: Secondary | ICD-10-CM | POA: Diagnosis present

## 2019-02-05 DIAGNOSIS — M1611 Unilateral primary osteoarthritis, right hip: Principal | ICD-10-CM | POA: Diagnosis present

## 2019-02-05 DIAGNOSIS — K4020 Bilateral inguinal hernia, without obstruction or gangrene, not specified as recurrent: Secondary | ICD-10-CM | POA: Diagnosis present

## 2019-02-05 DIAGNOSIS — N179 Acute kidney failure, unspecified: Secondary | ICD-10-CM | POA: Diagnosis not present

## 2019-02-05 DIAGNOSIS — Z9842 Cataract extraction status, left eye: Secondary | ICD-10-CM

## 2019-02-05 DIAGNOSIS — J449 Chronic obstructive pulmonary disease, unspecified: Secondary | ICD-10-CM | POA: Diagnosis present

## 2019-02-05 DIAGNOSIS — Z66 Do not resuscitate: Secondary | ICD-10-CM | POA: Diagnosis present

## 2019-02-05 DIAGNOSIS — I480 Paroxysmal atrial fibrillation: Secondary | ICD-10-CM | POA: Diagnosis present

## 2019-02-05 DIAGNOSIS — Z79899 Other long term (current) drug therapy: Secondary | ICD-10-CM

## 2019-02-05 DIAGNOSIS — D62 Acute posthemorrhagic anemia: Secondary | ICD-10-CM | POA: Diagnosis not present

## 2019-02-05 DIAGNOSIS — E7800 Pure hypercholesterolemia, unspecified: Secondary | ICD-10-CM | POA: Diagnosis present

## 2019-02-05 DIAGNOSIS — F419 Anxiety disorder, unspecified: Secondary | ICD-10-CM | POA: Diagnosis present

## 2019-02-05 DIAGNOSIS — D696 Thrombocytopenia, unspecified: Secondary | ICD-10-CM | POA: Diagnosis not present

## 2019-02-05 DIAGNOSIS — E785 Hyperlipidemia, unspecified: Secondary | ICD-10-CM | POA: Diagnosis present

## 2019-02-05 DIAGNOSIS — Z9889 Other specified postprocedural states: Secondary | ICD-10-CM

## 2019-02-05 DIAGNOSIS — Z79891 Long term (current) use of opiate analgesic: Secondary | ICD-10-CM

## 2019-02-05 DIAGNOSIS — Z96651 Presence of right artificial knee joint: Secondary | ICD-10-CM | POA: Diagnosis present

## 2019-02-05 DIAGNOSIS — Z8673 Personal history of transient ischemic attack (TIA), and cerebral infarction without residual deficits: Secondary | ICD-10-CM

## 2019-02-05 DIAGNOSIS — Z8249 Family history of ischemic heart disease and other diseases of the circulatory system: Secondary | ICD-10-CM

## 2019-02-05 DIAGNOSIS — Z888 Allergy status to other drugs, medicaments and biological substances status: Secondary | ICD-10-CM

## 2019-02-05 DIAGNOSIS — Z170 Estrogen receptor positive status [ER+]: Secondary | ICD-10-CM

## 2019-02-05 DIAGNOSIS — M1710 Unilateral primary osteoarthritis, unspecified knee: Secondary | ICD-10-CM | POA: Diagnosis present

## 2019-02-05 DIAGNOSIS — I5032 Chronic diastolic (congestive) heart failure: Secondary | ICD-10-CM | POA: Diagnosis present

## 2019-02-05 DIAGNOSIS — E871 Hypo-osmolality and hyponatremia: Secondary | ICD-10-CM | POA: Diagnosis present

## 2019-02-05 DIAGNOSIS — Z9841 Cataract extraction status, right eye: Secondary | ICD-10-CM

## 2019-02-05 DIAGNOSIS — Z9289 Personal history of other medical treatment: Secondary | ICD-10-CM

## 2019-02-05 DIAGNOSIS — Z9049 Acquired absence of other specified parts of digestive tract: Secondary | ICD-10-CM

## 2019-02-05 DIAGNOSIS — I313 Pericardial effusion (noninflammatory): Secondary | ICD-10-CM | POA: Diagnosis not present

## 2019-02-05 DIAGNOSIS — I483 Typical atrial flutter: Secondary | ICD-10-CM | POA: Diagnosis not present

## 2019-02-05 DIAGNOSIS — G4700 Insomnia, unspecified: Secondary | ICD-10-CM | POA: Diagnosis present

## 2019-02-05 DIAGNOSIS — Z96641 Presence of right artificial hip joint: Secondary | ICD-10-CM

## 2019-02-05 DIAGNOSIS — Z961 Presence of intraocular lens: Secondary | ICD-10-CM | POA: Diagnosis present

## 2019-02-05 DIAGNOSIS — R0781 Pleurodynia: Secondary | ICD-10-CM | POA: Diagnosis not present

## 2019-02-05 DIAGNOSIS — Z7901 Long term (current) use of anticoagulants: Secondary | ICD-10-CM

## 2019-02-05 DIAGNOSIS — C801 Malignant (primary) neoplasm, unspecified: Secondary | ICD-10-CM | POA: Diagnosis not present

## 2019-02-05 DIAGNOSIS — I110 Hypertensive heart disease with heart failure: Secondary | ICD-10-CM | POA: Diagnosis present

## 2019-02-05 DIAGNOSIS — Z9181 History of falling: Secondary | ICD-10-CM

## 2019-02-05 DIAGNOSIS — Z9012 Acquired absence of left breast and nipple: Secondary | ICD-10-CM

## 2019-02-05 HISTORY — PX: TOTAL HIP ARTHROPLASTY: SHX124

## 2019-02-05 LAB — HEMOGLOBIN AND HEMATOCRIT, BLOOD
HCT: 28.2 % — ABNORMAL LOW (ref 36.0–46.0)
Hemoglobin: 8.9 g/dL — ABNORMAL LOW (ref 12.0–15.0)

## 2019-02-05 LAB — PREPARE RBC (CROSSMATCH)

## 2019-02-05 SURGERY — ARTHROPLASTY, HIP, TOTAL, ANTERIOR APPROACH
Anesthesia: General | Site: Hip | Laterality: Right

## 2019-02-05 MED ORDER — ALBUMIN HUMAN 5 % IV SOLN
INTRAVENOUS | Status: DC | PRN
  Administered 2019-02-05: 16:00:00 via INTRAVENOUS

## 2019-02-05 MED ORDER — TRANEXAMIC ACID 1000 MG/10ML IV SOLN
INTRAVENOUS | Status: DC | PRN
  Administered 2019-02-05: 2000 mg via TOPICAL

## 2019-02-05 MED ORDER — APIXABAN 2.5 MG PO TABS
2.5000 mg | ORAL_TABLET | Freq: Two times a day (BID) | ORAL | Status: DC
  Administered 2019-02-06 – 2019-02-14 (×17): 2.5 mg via ORAL
  Filled 2019-02-05 (×17): qty 1

## 2019-02-05 MED ORDER — PHENYLEPHRINE HCL-NACL 10-0.9 MG/250ML-% IV SOLN
0.0000 ug/min | INTRAVENOUS | Status: DC
  Administered 2019-02-05: 66.667 ug/min via INTRAVENOUS

## 2019-02-05 MED ORDER — DOCUSATE SODIUM 100 MG PO CAPS
100.0000 mg | ORAL_CAPSULE | Freq: Two times a day (BID) | ORAL | Status: DC
  Administered 2019-02-05 – 2019-02-14 (×17): 100 mg via ORAL
  Filled 2019-02-05 (×17): qty 1

## 2019-02-05 MED ORDER — PHENYLEPHRINE HCL-NACL 10-0.9 MG/250ML-% IV SOLN
INTRAVENOUS | Status: DC | PRN
  Administered 2019-02-05: 25 ug/min via INTRAVENOUS

## 2019-02-05 MED ORDER — OXYCODONE-ACETAMINOPHEN 5-325 MG PO TABS: 1.0000 | ORAL_TABLET | ORAL | 0 refills | Status: DC | PRN

## 2019-02-05 MED ORDER — SODIUM CHLORIDE 0.9% FLUSH
INTRAVENOUS | Status: DC | PRN
  Administered 2019-02-05: 50 mL

## 2019-02-05 MED ORDER — VANCOMYCIN HCL IN DEXTROSE 1-5 GM/200ML-% IV SOLN
1000.0000 mg | INTRAVENOUS | Status: AC
  Administered 2019-02-05: 1000 mg via INTRAVENOUS

## 2019-02-05 MED ORDER — POVIDONE-IODINE 10 % EX SWAB
2.0000 "application " | Freq: Once | CUTANEOUS | Status: AC
  Administered 2019-02-05: 2 via TOPICAL

## 2019-02-05 MED ORDER — ACETAMINOPHEN 325 MG PO TABS: 325.0000 mg | ORAL_TABLET | Freq: Four times a day (QID) | ORAL | Status: DC | PRN

## 2019-02-05 MED ORDER — METHOCARBAMOL 500 MG PO TABS: 500.0000 mg | ORAL_TABLET | Freq: Four times a day (QID) | ORAL | Status: DC | PRN

## 2019-02-05 MED ORDER — ONDANSETRON HCL 4 MG PO TABS: 4.0000 mg | ORAL_TABLET | Freq: Four times a day (QID) | ORAL | Status: DC | PRN

## 2019-02-05 MED ORDER — PANTOPRAZOLE SODIUM 40 MG PO TBEC: 40.0000 mg | DELAYED_RELEASE_TABLET | Freq: Every day | ORAL | Status: DC

## 2019-02-05 MED ORDER — POLYETHYLENE GLYCOL 3350 17 G PO PACK
17.0000 g | PACK | ORAL | Status: DC
  Administered 2019-02-07 – 2019-02-14 (×4): 17 g via ORAL
  Filled 2019-02-05 (×5): qty 1

## 2019-02-05 MED ORDER — FUROSEMIDE 20 MG PO TABS
30.0000 mg | ORAL_TABLET | Freq: Every day | ORAL | Status: DC
  Administered 2019-02-05: 30 mg via ORAL
  Filled 2019-02-05: qty 2

## 2019-02-05 MED ORDER — SODIUM CHLORIDE (PF) 0.9 % IJ SOLN
INTRAMUSCULAR | Status: AC
  Filled 2019-02-05: qty 50

## 2019-02-05 MED ORDER — MENTHOL 3 MG MT LOZG: 1.0000 | LOZENGE | OROMUCOSAL | Status: DC | PRN

## 2019-02-05 MED ORDER — DIPHENHYDRAMINE HCL 12.5 MG/5ML PO ELIX: 12.5000 mg | ORAL_SOLUTION | ORAL | Status: DC | PRN

## 2019-02-05 MED ORDER — PROPOFOL 500 MG/50ML IV EMUL
INTRAVENOUS | Status: AC
  Filled 2019-02-05: qty 50

## 2019-02-05 MED ORDER — FENTANYL CITRATE (PF) 100 MCG/2ML IJ SOLN
INTRAMUSCULAR | Status: AC
  Filled 2019-02-05: qty 2

## 2019-02-05 MED ORDER — BUPIVACAINE HCL (PF) 0.25 % IJ SOLN
INTRAMUSCULAR | Status: AC
  Filled 2019-02-05: qty 30

## 2019-02-05 MED ORDER — EPHEDRINE SULFATE-NACL 50-0.9 MG/10ML-% IV SOSY
PREFILLED_SYRINGE | INTRAVENOUS | Status: DC | PRN
  Administered 2019-02-05: 10 mg via INTRAVENOUS

## 2019-02-05 MED ORDER — FENTANYL CITRATE (PF) 100 MCG/2ML IJ SOLN: 25.0000 ug | INTRAMUSCULAR | Status: DC | PRN

## 2019-02-05 MED ORDER — ONDANSETRON HCL 4 MG/2ML IJ SOLN: 4.0000 mg | Freq: Once | INTRAMUSCULAR | Status: DC | PRN

## 2019-02-05 MED ORDER — LOSARTAN POTASSIUM 50 MG PO TABS
100.0000 mg | ORAL_TABLET | Freq: Every day | ORAL | Status: DC
  Administered 2019-02-05: 100 mg via ORAL
  Filled 2019-02-05: qty 2

## 2019-02-05 MED ORDER — BISACODYL 5 MG PO TBEC: 5.0000 mg | DELAYED_RELEASE_TABLET | Freq: Every day | ORAL | Status: DC | PRN

## 2019-02-05 MED ORDER — OXYCODONE HCL 5 MG PO TABS: 5.0000 mg | ORAL_TABLET | Freq: Once | ORAL | Status: DC | PRN

## 2019-02-05 MED ORDER — EPHEDRINE 5 MG/ML INJ
INTRAVENOUS | Status: AC
  Filled 2019-02-05: qty 10

## 2019-02-05 MED ORDER — DEXAMETHASONE SODIUM PHOSPHATE 10 MG/ML IJ SOLN
INTRAMUSCULAR | Status: DC | PRN
  Administered 2019-02-05: 5 mg via INTRAVENOUS

## 2019-02-05 MED ORDER — PANTOPRAZOLE SODIUM 40 MG PO TBEC
40.0000 mg | DELAYED_RELEASE_TABLET | Freq: Every day | ORAL | Status: DC
  Administered 2019-02-06 – 2019-02-14 (×8): 40 mg via ORAL
  Filled 2019-02-05 (×8): qty 1

## 2019-02-05 MED ORDER — ONDANSETRON HCL 4 MG/2ML IJ SOLN
INTRAMUSCULAR | Status: DC | PRN
  Administered 2019-02-05: 4 mg via INTRAVENOUS

## 2019-02-05 MED ORDER — SUGAMMADEX SODIUM 200 MG/2ML IV SOLN
INTRAVENOUS | Status: DC | PRN
  Administered 2019-02-05: 100 mg via INTRAVENOUS

## 2019-02-05 MED ORDER — LIDOCAINE 2% (20 MG/ML) 5 ML SYRINGE
INTRAMUSCULAR | Status: AC
  Filled 2019-02-05: qty 5

## 2019-02-05 MED ORDER — TRANEXAMIC ACID-NACL 1000-0.7 MG/100ML-% IV SOLN
INTRAVENOUS | Status: AC
  Filled 2019-02-05: qty 100

## 2019-02-05 MED ORDER — CELECOXIB 200 MG PO CAPS
200.0000 mg | ORAL_CAPSULE | Freq: Two times a day (BID) | ORAL | Status: DC
  Administered 2019-02-05 – 2019-02-14 (×17): 200 mg via ORAL
  Filled 2019-02-05 (×20): qty 1

## 2019-02-05 MED ORDER — PHENOL 1.4 % MT LIQD
1.0000 | OROMUCOSAL | Status: DC | PRN
  Filled 2019-02-05: qty 177

## 2019-02-05 MED ORDER — FENTANYL CITRATE (PF) 100 MCG/2ML IJ SOLN
25.0000 ug | INTRAMUSCULAR | Status: DC | PRN
  Administered 2019-02-05 (×2): 25 ug via INTRAVENOUS

## 2019-02-05 MED ORDER — OXYCODONE HCL 5 MG/5ML PO SOLN: 5.0000 mg | Freq: Once | ORAL | Status: DC | PRN

## 2019-02-05 MED ORDER — METOCLOPRAMIDE HCL 5 MG PO TABS: 5.0000 mg | ORAL_TABLET | Freq: Three times a day (TID) | ORAL | Status: DC | PRN

## 2019-02-05 MED ORDER — DOCUSATE SODIUM 100 MG PO CAPS: 100.0000 mg | ORAL_CAPSULE | Freq: Two times a day (BID) | ORAL | Status: DC

## 2019-02-05 MED ORDER — TRANEXAMIC ACID-NACL 1000-0.7 MG/100ML-% IV SOLN
1000.0000 mg | Freq: Once | INTRAVENOUS | Status: AC
  Administered 2019-02-05: 1000 mg via INTRAVENOUS
  Filled 2019-02-05: qty 100

## 2019-02-05 MED ORDER — ONDANSETRON HCL 4 MG/2ML IJ SOLN
INTRAMUSCULAR | Status: AC
  Filled 2019-02-05: qty 2

## 2019-02-05 MED ORDER — BUPIVACAINE-EPINEPHRINE 0.25% -1:200000 IJ SOLN
INTRAMUSCULAR | Status: DC | PRN
  Administered 2019-02-05: 30 mL

## 2019-02-05 MED ORDER — VITAMIN D 25 MCG (1000 UNIT) PO TABS
1000.0000 [IU] | ORAL_TABLET | Freq: Every day | ORAL | Status: DC
  Administered 2019-02-05 – 2019-02-14 (×9): 1000 [IU] via ORAL
  Filled 2019-02-05 (×9): qty 1

## 2019-02-05 MED ORDER — KCL IN DEXTROSE-NACL 20-5-0.45 MEQ/L-%-% IV SOLN
INTRAVENOUS | Status: DC
  Administered 2019-02-06 – 2019-02-07 (×3): via INTRAVENOUS
  Filled 2019-02-05 (×5): qty 1000

## 2019-02-05 MED ORDER — 0.9 % SODIUM CHLORIDE (POUR BTL) OPTIME
TOPICAL | Status: DC | PRN
  Administered 2019-02-05: 1000 mL

## 2019-02-05 MED ORDER — ACETAMINOPHEN 500 MG PO TABS
1000.0000 mg | ORAL_TABLET | Freq: Four times a day (QID) | ORAL | Status: AC
  Administered 2019-02-05 – 2019-02-06 (×4): 1000 mg via ORAL
  Filled 2019-02-05 (×4): qty 2

## 2019-02-05 MED ORDER — LACTATED RINGERS IV SOLN
INTRAVENOUS | Status: DC
  Administered 2019-02-05 (×2): via INTRAVENOUS

## 2019-02-05 MED ORDER — PROPOFOL 10 MG/ML IV BOLUS
INTRAVENOUS | Status: AC
  Filled 2019-02-05: qty 20

## 2019-02-05 MED ORDER — HYDROMORPHONE HCL 1 MG/ML IJ SOLN: 0.5000 mg | INTRAMUSCULAR | Status: DC | PRN

## 2019-02-05 MED ORDER — METOCLOPRAMIDE HCL 5 MG/ML IJ SOLN: 5.0000 mg | Freq: Three times a day (TID) | INTRAMUSCULAR | Status: DC | PRN

## 2019-02-05 MED ORDER — DEXAMETHASONE SODIUM PHOSPHATE 10 MG/ML IJ SOLN
INTRAMUSCULAR | Status: AC
  Filled 2019-02-05: qty 1

## 2019-02-05 MED ORDER — ONDANSETRON HCL 4 MG/2ML IJ SOLN: 4.0000 mg | Freq: Four times a day (QID) | INTRAMUSCULAR | Status: DC | PRN

## 2019-02-05 MED ORDER — TEMAZEPAM 15 MG PO CAPS
15.0000 mg | ORAL_CAPSULE | Freq: Every day | ORAL | Status: DC
  Administered 2019-02-05 – 2019-02-13 (×9): 15 mg via ORAL
  Filled 2019-02-05 (×9): qty 1

## 2019-02-05 MED ORDER — PROPOFOL 10 MG/ML IV BOLUS
INTRAVENOUS | Status: DC | PRN
  Administered 2019-02-05 (×2): 20 mg via INTRAVENOUS
  Administered 2019-02-05: 80 mg via INTRAVENOUS

## 2019-02-05 MED ORDER — GABAPENTIN 300 MG PO CAPS
300.0000 mg | ORAL_CAPSULE | Freq: Three times a day (TID) | ORAL | Status: DC
  Administered 2019-02-05 – 2019-02-14 (×24): 300 mg via ORAL
  Filled 2019-02-05 (×24): qty 1

## 2019-02-05 MED ORDER — TRANEXAMIC ACID-NACL 1000-0.7 MG/100ML-% IV SOLN
1000.0000 mg | INTRAVENOUS | Status: AC
  Administered 2019-02-05: 1000 mg via INTRAVENOUS

## 2019-02-05 MED ORDER — CHLORHEXIDINE GLUCONATE 4 % EX LIQD: 60.0000 mL | Freq: Once | CUTANEOUS | Status: DC

## 2019-02-05 MED ORDER — FLEET ENEMA 7-19 GM/118ML RE ENEM: 1.0000 | ENEMA | Freq: Once | RECTAL | Status: DC | PRN

## 2019-02-05 MED ORDER — SODIUM CHLORIDE 0.9 % IV SOLN: 10.0000 mL/h | Freq: Once | INTRAVENOUS | Status: DC

## 2019-02-05 MED ORDER — METHOCARBAMOL 500 MG IVPB - SIMPLE MED
INTRAVENOUS | Status: AC
  Administered 2019-02-05: 500 mg via INTRAVENOUS
  Filled 2019-02-05: qty 50

## 2019-02-05 MED ORDER — OXYCODONE HCL 5 MG PO TABS: 5.0000 mg | ORAL_TABLET | ORAL | Status: DC | PRN

## 2019-02-05 MED ORDER — TIZANIDINE HCL 2 MG PO TABS: 2.0000 mg | ORAL_TABLET | Freq: Four times a day (QID) | ORAL | 0 refills | Status: DC | PRN

## 2019-02-05 MED ORDER — METHOCARBAMOL 500 MG IVPB - SIMPLE MED
500.0000 mg | Freq: Four times a day (QID) | INTRAVENOUS | Status: DC | PRN
  Administered 2019-02-05: 18:00:00 500 mg via INTRAVENOUS
  Filled 2019-02-05: qty 50

## 2019-02-05 MED ORDER — AMLODIPINE BESYLATE 5 MG PO TABS: 5.0000 mg | ORAL_TABLET | Freq: Every day | ORAL | Status: DC

## 2019-02-05 MED ORDER — SERTRALINE HCL 25 MG PO TABS
25.0000 mg | ORAL_TABLET | Freq: Every day | ORAL | Status: DC
  Administered 2019-02-06 – 2019-02-14 (×8): 25 mg via ORAL
  Filled 2019-02-05 (×9): qty 1

## 2019-02-05 MED ORDER — ORAL CARE MOUTH RINSE
15.0000 mL | Freq: Two times a day (BID) | OROMUCOSAL | Status: DC
  Administered 2019-02-06 – 2019-02-13 (×10): 15 mL via OROMUCOSAL

## 2019-02-05 MED ORDER — PHENYLEPHRINE 40 MCG/ML (10ML) SYRINGE FOR IV PUSH (FOR BLOOD PRESSURE SUPPORT)
PREFILLED_SYRINGE | INTRAVENOUS | Status: AC
  Filled 2019-02-05: qty 10

## 2019-02-05 MED ORDER — ALBUMIN HUMAN 5 % IV SOLN
INTRAVENOUS | Status: AC
  Filled 2019-02-05: qty 250

## 2019-02-05 MED ORDER — BUPIVACAINE LIPOSOME 1.3 % IJ SUSP
INTRAMUSCULAR | Status: DC | PRN
  Administered 2019-02-05: 10 mL

## 2019-02-05 MED ORDER — PHENYLEPHRINE 40 MCG/ML (10ML) SYRINGE FOR IV PUSH (FOR BLOOD PRESSURE SUPPORT)
PREFILLED_SYRINGE | INTRAVENOUS | Status: DC | PRN
  Administered 2019-02-05: 80 ug via INTRAVENOUS
  Administered 2019-02-05: 40 ug via INTRAVENOUS

## 2019-02-05 MED ORDER — CHLORHEXIDINE GLUCONATE CLOTH 2 % EX PADS
6.0000 | MEDICATED_PAD | Freq: Every day | CUTANEOUS | Status: DC
  Administered 2019-02-06 – 2019-02-12 (×2): 6 via TOPICAL

## 2019-02-05 MED ORDER — ROCURONIUM BROMIDE 10 MG/ML (PF) SYRINGE
PREFILLED_SYRINGE | INTRAVENOUS | Status: AC
  Filled 2019-02-05: qty 10

## 2019-02-05 MED ORDER — VANCOMYCIN HCL IN DEXTROSE 1-5 GM/200ML-% IV SOLN
INTRAVENOUS | Status: AC
  Filled 2019-02-05: qty 200

## 2019-02-05 MED ORDER — ROCURONIUM BROMIDE 100 MG/10ML IV SOLN
INTRAVENOUS | Status: DC | PRN
  Administered 2019-02-05: 30 mg via INTRAVENOUS

## 2019-02-05 MED ORDER — PHENYLEPHRINE HCL-NACL 10-0.9 MG/250ML-% IV SOLN
0.0000 ug/min | INTRAVENOUS | Status: DC
  Administered 2019-02-05: 20 ug/min via INTRAVENOUS
  Administered 2019-02-06: 9 ug/min via INTRAVENOUS
  Filled 2019-02-05: qty 250

## 2019-02-05 MED ORDER — BENZONATATE 100 MG PO CAPS: 100.0000 mg | ORAL_CAPSULE | Freq: Three times a day (TID) | ORAL | Status: DC | PRN

## 2019-02-05 MED ORDER — FENTANYL CITRATE (PF) 100 MCG/2ML IJ SOLN
INTRAMUSCULAR | Status: DC | PRN
  Administered 2019-02-05 (×4): 50 ug via INTRAVENOUS

## 2019-02-05 MED ORDER — LIDOCAINE 2% (20 MG/ML) 5 ML SYRINGE
INTRAMUSCULAR | Status: DC | PRN
  Administered 2019-02-05: 40 mg via INTRAVENOUS

## 2019-02-05 MED ORDER — DEXAMETHASONE SODIUM PHOSPHATE 10 MG/ML IJ SOLN
10.0000 mg | Freq: Once | INTRAMUSCULAR | Status: AC
  Administered 2019-02-06: 10 mg via INTRAVENOUS
  Filled 2019-02-05: qty 1

## 2019-02-05 MED ORDER — ALUM & MAG HYDROXIDE-SIMETH 200-200-20 MG/5ML PO SUSP: 30.0000 mL | ORAL | Status: DC | PRN

## 2019-02-05 MED ORDER — PHENYLEPHRINE HCL-NACL 10-0.9 MG/250ML-% IV SOLN
INTRAVENOUS | Status: AC
  Filled 2019-02-05: qty 250

## 2019-02-05 MED ORDER — POLYETHYLENE GLYCOL 3350 17 G PO PACK: 17.0000 g | PACK | Freq: Every day | ORAL | Status: DC | PRN

## 2019-02-05 MED ORDER — ANASTROZOLE 1 MG PO TABS
1.0000 mg | ORAL_TABLET | Freq: Every day | ORAL | Status: DC
  Administered 2019-02-06 – 2019-02-14 (×9): 1 mg via ORAL
  Filled 2019-02-05 (×9): qty 1

## 2019-02-05 SURGICAL SUPPLY — 47 items
BAG DECANTER FOR FLEXI CONT (MISCELLANEOUS) ×3 IMPLANT
BLADE SAW SGTL 18X1.27X75 (BLADE) ×2 IMPLANT
BLADE SAW SGTL 18X1.27X75MM (BLADE) ×1
BLADE SURG SZ10 CARB STEEL (BLADE) ×6 IMPLANT
CONT SPEC 4OZ CLIKSEAL STRL BL (MISCELLANEOUS) ×3 IMPLANT
COVER PERINEAL POST (MISCELLANEOUS) ×3 IMPLANT
COVER SURGICAL LIGHT HANDLE (MISCELLANEOUS) ×3 IMPLANT
COVER WAND RF STERILE (DRAPES) ×3 IMPLANT
CUP ACET PINNACLE SECTR 48MM (Joint) IMPLANT
DECANTER SPIKE VIAL GLASS SM (MISCELLANEOUS) ×6 IMPLANT
DRAPE STERI IOBAN 125X83 (DRAPES) ×3 IMPLANT
DRAPE U-SHAPE 47X51 STRL (DRAPES) ×6 IMPLANT
DRSG AQUACEL AG ADV 3.5X 6 (GAUZE/BANDAGES/DRESSINGS) ×2 IMPLANT
DRSG AQUACEL AG ADV 3.5X10 (GAUZE/BANDAGES/DRESSINGS) ×3 IMPLANT
DURAPREP 26ML APPLICATOR (WOUND CARE) ×3 IMPLANT
ELECT BLADE TIP CTD 4 INCH (ELECTRODE) ×3 IMPLANT
ELECT REM PT RETURN 15FT ADLT (MISCELLANEOUS) ×3 IMPLANT
GLOVE BIO SURGEON STRL SZ7.5 (GLOVE) ×3 IMPLANT
GLOVE BIO SURGEON STRL SZ8.5 (GLOVE) ×3 IMPLANT
GLOVE BIOGEL PI IND STRL 8 (GLOVE) ×1 IMPLANT
GLOVE BIOGEL PI IND STRL 9 (GLOVE) ×1 IMPLANT
GLOVE BIOGEL PI INDICATOR 8 (GLOVE) ×2
GLOVE BIOGEL PI INDICATOR 9 (GLOVE) ×2
GOWN STRL REUS W/TWL XL LVL3 (GOWN DISPOSABLE) ×6 IMPLANT
HEAD FEM STD 32X+1 STRL (Hips) ×2 IMPLANT
HOLDER FOLEY CATH W/STRAP (MISCELLANEOUS) ×3 IMPLANT
KIT TURNOVER KIT A (KITS) IMPLANT
MANIFOLD NEPTUNE II (INSTRUMENTS) ×3 IMPLANT
NDL HYPO 21X1.5 SAFETY (NEEDLE) ×2 IMPLANT
NEEDLE HYPO 21X1.5 SAFETY (NEEDLE) ×6 IMPLANT
NS IRRIG 1000ML POUR BTL (IV SOLUTION) ×3 IMPLANT
PACK ANTERIOR HIP CUSTOM (KITS) ×3 IMPLANT
PINN ALTRX NEUT ID X OD 32X48 ×2 IMPLANT
PINNSECTOR W/GRIP ACE CUP 48MM (Joint) ×3 IMPLANT
STEM FEM ACTIS STD SZ4 (Stem) ×2 IMPLANT
SUT ETHIBOND NAB CT1 #1 30IN (SUTURE) ×3 IMPLANT
SUT VIC AB 0 CT1 27 (SUTURE) ×3
SUT VIC AB 0 CT1 27XBRD ANBCTR (SUTURE) ×1 IMPLANT
SUT VIC AB 1 CTX 36 (SUTURE) ×3
SUT VIC AB 1 CTX36XBRD ANBCTR (SUTURE) ×1 IMPLANT
SUT VIC AB 2-0 CT1 27 (SUTURE) ×3
SUT VIC AB 2-0 CT1 TAPERPNT 27 (SUTURE) ×1 IMPLANT
SUT VIC AB 3-0 CT1 27 (SUTURE) ×3
SUT VIC AB 3-0 CT1 TAPERPNT 27 (SUTURE) ×1 IMPLANT
SYR CONTROL 10ML LL (SYRINGE) ×9 IMPLANT
TRAY FOLEY MTR SLVR 16FR STAT (SET/KITS/TRAYS/PACK) IMPLANT
YANKAUER SUCT BULB TIP 10FT TU (MISCELLANEOUS) ×3 IMPLANT

## 2019-02-05 NOTE — Care Plan (Signed)
Ortho Bundle Case Management Note  Patient Details  Name: Glenda [name] MRN: [id number] Date of Birth: 03/15/1925  Spoke with patient and Carla Drape at [facility]  [phone]) prior to surgery. Patient will return to her room at the ALF post surgery. She will have meals brought to her and therapy in her room. The staff will assist with ADLs and do room checks every 30 minutes. She also has a call bell in the room if needed. Due to COVID-19 restrictions no family is allowed to come and stay with her in the facility. The patient feels comfortable with this plan. She has equipment at home. Patient and MD in agreement with plan.                  DME Arranged:    DME Agency:     HH Arranged:  PT HH Agency:  (will have PT at the ALF from [facility])  Additional Comments: Please contact me with any questions of if this plan should need to change.  Ladell Heads,  [place] [facility] Specialist  [phone] 02/05/2019, 2:36 PM

## 2019-02-05 NOTE — Transfer of Care (Signed)
Immediate Anesthesia Transfer of Care Note  Patient: Glenda [name]  Procedure(s) Performed: [facility] TOTAL HIP ARTHROPLASTY ANTERIOR APPROACH AND REMOVAL OF SCREWS (Right Hip)  Patient Location: PACU  Anesthesia Type:General  Level of Consciousness: sedated  Airway & Oxygen Therapy: Patient Spontanous Breathing and Patient connected to face mask oxygen  Post-op Assessment: Report given to RN and Post -op Vital signs reviewed and stable  Post vital signs: Reviewed and stable  Last Vitals:  Vitals Value Taken Time  BP 136/58 02/05/19 1700  Temp    Pulse 53 02/05/19 1701  Resp 13 02/05/19 1701  SpO2 100 % 02/05/19 1701  Vitals shown include unvalidated device data.  Last Pain:  Vitals:   02/05/19 1229  TempSrc:   PainSc: 0-No pain         Complications: No apparent anesthesia complications

## 2019-02-05 NOTE — Anesthesia Postprocedure Evaluation (Signed)
Anesthesia Post Note  Patient: Glenda [name]  Procedure(s) Performed: [facility] TOTAL HIP ARTHROPLASTY ANTERIOR APPROACH AND REMOVAL OF SCREWS (Right Hip)     Patient location during evaluation: PACU Anesthesia Type: General Level of consciousness: awake and alert Pain management: pain level controlled Vital Signs Assessment: post-procedure vital signs reviewed and stable Respiratory status: spontaneous breathing, nonlabored ventilation, respiratory function stable and patient connected to nasal cannula oxygen Cardiovascular status: blood pressure returned to baseline and stable Postop Assessment: no apparent nausea or vomiting Anesthetic complications: no Comments: Patient requiring low dose phenylephrine infusion to maintain MAP > 60, HgB decreased to 8.9 Will transfuse 1 unit PRBC's, and she will go to stepdown for further titration of pressor    Last Vitals:  Vitals:   02/05/19 1855 02/05/19 1900  BP: 116/61 (!) 119/56  Pulse: 64 62  Resp: 14 14  Temp:    SpO2: 100% 100%    Last Pain:  Vitals:   02/05/19 1900  TempSrc:   PainSc: 0-No pain    LLE Motor Response: Purposeful movement (02/05/19 1900) LLE Sensation: Full sensation (02/05/19 1900) RLE Motor Response: Purposeful movement (02/05/19 1900) RLE Sensation: Full sensation (02/05/19 1900) L Sensory Level: S1-Sole of foot, small toes (02/05/19 1900) R Sensory Level: S1-Sole of foot, small toes (02/05/19 1900)  Nelle Sayed S

## 2019-02-05 NOTE — Op Note (Signed)
PATIENT ID:      Glenda [name]  MRN:     [id number] DOB/AGE:    21-Jul-1924 / 85 y.o.  OPERATIVE REPORT   DATE OF PROCEDURE:  02/05/2019      PREOPERATIVE DIAGNOSIS:  RIGHT HIP FEMORAL NECK NON-UNION                                                         POSTOPERATIVE DIAGNOSIS:  Same                                                        PROCEDURE: Right hip removal of cannulated screws. Anterior R total hip arthroplasty using a 48 mm DePuy Gryption sector Cup, [employer], 0-degree polyethylene liner, a +1 mm x 60mm metal head, a 4 std Depuy Actis stem  SURGEON: [name]  ASSISTANT:   Kerry Hough. [employer]  (present throughout entire procedure and necessary for timely completion of the procedure)   ANESTHESIA: Spinal, Exparel 133mg  injection BLOOD LOSS: 500 cc FLUID REPLACEMENT: 1500 cc crystalloid TRANEXAMIC ACID: 1gm IV, 2gm Topical COMPLICATIONS: none    INDICATIONS FOR PROCEDURE: A 85 y.o. year-old With  [place]   for 6 mos, x-rays show collapse of fem neck and osteophytes. Patient desires urgent R total hip arthroplasty to decrease pain and increase function. The risks, benefits, and alternatives were discussed at length including but not limited to the risks of infection, bleeding, nerve injury, stiffness, blood clots, the need for revision surgery, cardiopulmonary complications, among others, and they were willing to proceed. Questions answered      PROCEDURE IN DETAIL: The patient was identified by armband,   received preoperative IV antibiotics in the holding area at [facility], taken to the operating room , appropriate anesthetic monitors   were attached and anesthesia was induced with the patient on the gurney. HANA boots were applied to the feet, and the patient  was transferred to the HANA table with a peroneal post and support underneath the non-operative leg. Theoperative lower extremity was then prepped and draped in the  usual sterile fashion from just above the iliac crest to the knee. And a timeout procedure was performed. Skin along incision area was injected with 10 cc of Exparel solution. We then made a 13 cm incision along the interval at the leading edge of the tensor fascia lata of starting at 2 cm lateral to the ASIS. Small bleeders in the skin and subcutaneous tissue identified and cauterized we dissected down to the fascia and made an incision in the fascia allowing [name] to elevate the fascia of the tensor muscle and exploited the interval between the rectus and the tensor fascia lata. A Cobra retractor was then placed along the superior neck of the femur. A cerebellar retractor was used to expose the interval between the tensor fascia lata and the rectus femoris.  We identified and cauterized the ascending branch of the anterior circumflex artery. A second Cobra retractor along the inferior neck of the femur.  At this time a 2 cm incision was made lateral to the flare  of the greater trochanter with a 10 blade down through the tensor fascia lata and vastus lateralis to the heads of the 3 cannulated screws which were then removed with a 4 mm Synthes screwdriver.  A small Hohmann retractor was placed underneath the origin of the rectus femoris, giving [name] good medial exposure. Using Ronguers fatty tissue was removed from in front of the anterior capsule. The capsule was then incised, starting out at the superior anterior rim of the acetabulum going laterally along the anterior neck. The capsule was then teed along the neck superiorly and inferiorly. Electrocautery was used to release capsule from the anterior and medial neck of the femur to allow external rotation. Cobra retractors were then placed along the inferior and superior neck allowing [name] to perform a standard neck cut and removed the femoral head with a power corkscrew. We then placed a medium bent homan retractor in the cotyloid notch and posteriorly along the  acetabular rim a narrow Cobra retractor. Exposed labral tissue and osteophytes were then removed. We then sequentially reamed up to a 47 mm basket reamer obtaining good coverage in all quadrants, verified by C-arm imaging. Under C-arm control we then hammered into place a 48 mm Gryption sector in 45 of abduction and 15 of anteversion. The cup seated nicely and required no supplemental screws. We then placed a central hole Eliminator and a 0 polyethylene liner. The foot was then externally rotated to 130-140. The limb was extended and adducted to the floor, delivering the proximal femur up into the wound. A medium curved Hohmann retractor was placed over the greater trochanter and a long Homan retractor along the posterior femoral neck completing the exposure and lateralizing the femur. We then performed releases superiorly and and inferiorly of the capsule going back to the pirformis fossa superiorly and to the lesser trochanter inferiorly. We then entered the proximal femur with the box cutting offset chisel followed by, a canal sounder, the chili pepper and broaching up to a 4 broach. This seated nicely and we reamed the calcar. A trial reduction was performed with a 1 mm X 32 mm head.The limb lengths were excellent the hip was stable in 90 of external rotation. At this point the trial components removed and we hammered into place a # 4 std  Offset Actis stem with Gryption coating. A + 1 mm x 32 metal head was then hammered into place. The hip was reduced and final C-arm images obtained. The wound was thoroughly irrigated with normal saline solution. We repaired the ant capsule and the tensor fascia lot a with running 0 vicryl suture. the subcutaneous tissue was closed with 2-0 and 3-0 Vicryl suture followed by an Aquacil dressing. At this point the patient was awaken and transferred to hospital gurney without difficulty.   [name] 02/05/2019, 7:07 AM

## 2019-02-05 NOTE — Discharge Instructions (Addendum)
Please obtain a blood pressure cuff, check your blood pressure daily, and report heart rates greater than 110 to Dr. Jacalyn Lefevre office, [phone].  INSTRUCTIONS AFTER JOINT REPLACEMENT   o Remove items at home which could result in a fall. This includes throw rugs or furniture in walking pathways o ICE to the affected joint every three hours while awake for 30 minutes at a time, for at least the first 3-5 days, and then as needed for pain and swelling.  Continue to use ice for pain and swelling. You may notice swelling that will progress down to the foot and ankle.  This is normal after surgery.  Elevate your leg when you are not up walking on it.   o Continue to use the breathing machine you got in the hospital (incentive spirometer) which will help keep your temperature down.  It is common for your temperature to cycle up and down following surgery, especially at night when you are not up moving around and exerting yourself.  The breathing machine keeps your lungs expanded and your temperature down.   DIET:  As you were doing prior to hospitalization, we recommend a well-balanced diet.  DRESSING / WOUND CARE / SHOWERING  Keep the surgical dressing until follow up.  The dressing is water proof, so you can shower without any extra covering.  IF THE DRESSING FALLS OFF or the wound gets wet inside, change the dressing with sterile gauze.  Please use good hand washing techniques before changing the dressing.  Do not use any lotions or creams on the incision until instructed by your surgeon.    ACTIVITY  o Increase activity slowly as tolerated, but follow the weight bearing instructions below.   o No driving for 6 weeks or until further direction given by your physician.  You cannot drive while taking narcotics.  o No lifting or carrying greater than 10 lbs. until further directed by your surgeon. o Avoid periods of inactivity such as sitting longer than an hour when not asleep. This helps prevent  blood clots.  o You may return to work once you are authorized by your doctor.     WEIGHT BEARING   Weight bearing as tolerated with assist device (walker, cane, etc) as directed, use it as long as suggested by your surgeon or therapist, typically at least 4-6 weeks.   EXERCISES  Results after joint replacement surgery are often greatly improved when you follow the exercise, range of motion and muscle strengthening exercises prescribed by your doctor. Safety measures are also important to protect the joint from further injury. Any time any of these exercises cause you to have increased pain or swelling, decrease what you are doing until you are comfortable again and then slowly increase them. If you have problems or questions, call your caregiver or physical therapist for advice.   Rehabilitation is important following a joint replacement. After just a few days of immobilization, the muscles of the leg can become weakened and shrink (atrophy).  These exercises are designed to build up the tone and strength of the thigh and leg muscles and to improve motion. Often times heat used for twenty to thirty minutes before working out will loosen up your tissues and help with improving the range of motion but do not use heat for the first two weeks following surgery (sometimes heat can increase post-operative swelling).   These exercises can be done on a training (exercise) mat, on the floor, on a table or on a bed.  Use whatever works the best and is most comfortable for you.    Use music or television while you are exercising so that the exercises are a pleasant break in your day. This will make your life better with the exercises acting as a break in your routine that you can look forward to.   Perform all exercises about fifteen times, three times per day or as directed.  You should exercise both the operative leg and the other leg as well.  Exercises include:    Quad Sets - Tighten up the muscle on  the front of the thigh (Quad) and hold for 5-10 seconds.    Straight Leg Raises - With your knee straight (if you were given a brace, keep it on), lift the leg to 60 degrees, hold for 3 seconds, and slowly lower the leg.  Perform this exercise against resistance later as your leg gets stronger.   Leg Slides: Lying on your back, slowly slide your foot toward your buttocks, bending your knee up off the floor (only go as far as is comfortable). Then slowly slide your foot back down until your leg is flat on the floor again.   Angel Wings: Lying on your back spread your legs to the side as far apart as you can without causing discomfort.   Hamstring Strength:  Lying on your back, push your heel against the floor with your leg straight by tightening up the muscles of your buttocks.  Repeat, but this time bend your knee to a comfortable angle, and push your heel against the floor.  You may put a pillow under the heel to make it more comfortable if necessary.   A rehabilitation program following joint replacement surgery can speed recovery and prevent re-injury in the future due to weakened muscles. Contact your doctor or a physical therapist for more information on knee rehabilitation.    CONSTIPATION  Constipation is defined medically as fewer than three stools per week and severe constipation as less than one stool per week.  Even if you have a regular bowel pattern at home, your normal regimen is likely to be disrupted due to multiple reasons following surgery.  Combination of anesthesia, postoperative narcotics, change in appetite and fluid intake all can affect your bowels.   YOU MUST use at least one of the following options; they are listed in order of increasing strength to get the job done.  They are all available over the counter, and you may need to use some, POSSIBLY even all of these options:    Drink plenty of fluids (prune juice may be helpful) and high fiber foods Colace 100 mg by mouth  twice a day  Senokot for constipation as directed and as needed Dulcolax (bisacodyl), take with full glass of water  Miralax (polyethylene glycol) once or twice a day as needed.  If you have tried all these things and are unable to have a bowel movement in the first 3-4 days after surgery call either your surgeon or your primary doctor.    If you experience loose stools or diarrhea, hold the medications until you stool forms back up.  If your symptoms do not get better within 1 week or if they get worse, check with your doctor.  If you experience "the worst abdominal pain ever" or develop nausea or vomiting, please contact the office immediately for further recommendations for treatment.   ITCHING:  If you experience itching with your medications, try taking only a single pain  pill, or even half a pain pill at a time.  You can also use Benadryl over the counter for itching or also to help with sleep.   TED HOSE STOCKINGS:  Use stockings on both legs until for at least 2 weeks or as directed by physician office. They may be removed at night for sleeping.  MEDICATIONS:  See your medication summary on the After Visit Summary that nursing will review with you.  You may have some home medications which will be placed on hold until you complete the course of blood thinner medication.  It is important for you to complete the blood thinner medication as prescribed.  PRECAUTIONS:  If you experience chest pain or shortness of breath - call 911 immediately for transfer to the hospital emergency department.   If you develop a fever greater that 101 F, purulent drainage from wound, increased redness or drainage from wound, foul odor from the wound/dressing, or calf pain - CONTACT YOUR SURGEON.                                                   FOLLOW-UP APPOINTMENTS:  If you do not already have a post-op appointment, please call the office for an appointment to be seen by your surgeon.  Guidelines for how  soon to be seen are listed in your After Visit Summary, but are typically between 1-4 weeks after surgery.  OTHER INSTRUCTIONS:   Knee Replacement:  Do not place pillow under knee, focus on keeping the knee straight while resting. CPM instructions: 0-90 degrees, 2 hours in the morning, 2 hours in the afternoon, and 2 hours in the evening. Place foam block, curve side up under heel at all times except when in CPM or when walking.  DO NOT modify, tear, cut, or change the foam block in any way.  MAKE SURE YOU:   Understand these instructions.   Get help right away if you are not doing well or get worse.    Thank you for letting us be a part of your medical care team.  It is a privilege we respect greatly.  We hope these instructions will help you stay on track for a fast and full recovery!

## 2019-02-05 NOTE — Anesthesia Procedure Notes (Signed)
Procedure Name: Intubation Date/Time: 02/05/2019 2:59 PM Performed by: Talbot Grumbling, CRNA Pre-anesthesia Checklist: Patient identified, Emergency Drugs available, Suction available and Patient being monitored Patient Re-evaluated:Patient Re-evaluated prior to induction Oxygen Delivery Method: Circle system utilized Preoxygenation: Pre-oxygenation with 100% oxygen Induction Type: IV induction Ventilation: Mask ventilation without difficulty Laryngoscope Size: Mac and 3 Grade View: Grade II Tube type: Oral Tube size: 7.0 mm Number of attempts: 2 (first pass-esogheal, noted immediately and ETT withdrawn. Intubated on second attempt. ) Airway Equipment and Method: Stylet Placement Confirmation: ETT inserted through vocal cords under direct vision,  positive ETCO2 and breath sounds checked- equal and bilateral Secured at: 21 cm Tube secured with: Tape Dental Injury: Injury to lip  Difficulty Due To: Difficult Airway- due to anterior larynx

## 2019-02-05 NOTE — Interval H&P Note (Signed)
History and Physical Interval Note:  02/05/2019 1:06 PM  Glenda [name]  has presented today for surgery, with the diagnosis of [place].  The various methods of treatment have been discussed with the patient and family. After consideration of risks, benefits and other options for treatment, the patient has consented to  Procedure(s): [place] (Right) as a surgical intervention.  The patient's history has been reviewed, patient examined, no change in status, stable for surgery.  I have reviewed the patient's chart and labs.  Questions were answered to the patient's satisfaction.     [name]

## 2019-02-06 ENCOUNTER — Encounter (HOSPITAL_COMMUNITY): Payer: Self-pay | Admitting: Orthopedic Surgery

## 2019-02-06 DIAGNOSIS — I9581 Postprocedural hypotension: Secondary | ICD-10-CM

## 2019-02-06 DIAGNOSIS — I9589 Other hypotension: Secondary | ICD-10-CM | POA: Diagnosis present

## 2019-02-06 DIAGNOSIS — D62 Acute posthemorrhagic anemia: Secondary | ICD-10-CM

## 2019-02-06 DIAGNOSIS — E861 Hypovolemia: Secondary | ICD-10-CM | POA: Diagnosis present

## 2019-02-06 LAB — CBC
HCT: 30.2 % — ABNORMAL LOW (ref 36.0–46.0)
Hemoglobin: 9.6 g/dL — ABNORMAL LOW (ref 12.0–15.0)
MCH: 28.8 pg (ref 26.0–34.0)
MCHC: 31.8 g/dL (ref 30.0–36.0)
MCV: 90.7 fL (ref 80.0–100.0)
Platelets: 164 10*3/uL (ref 150–400)
RBC: 3.33 MIL/uL — ABNORMAL LOW (ref 3.87–5.11)
RDW: 14.4 % (ref 11.5–15.5)
WBC: 9.3 10*3/uL (ref 4.0–10.5)
nRBC: 0 % (ref 0.0–0.2)

## 2019-02-06 LAB — TROPONIN I (HIGH SENSITIVITY): Troponin I (High Sensitivity): <2 ng/L (ref <18)

## 2019-02-06 LAB — BASIC METABOLIC PANEL
Anion gap: 9 (ref 5–15)
BUN: 20 mg/dL (ref 8–23)
CO2: 22 mmol/L (ref 22–32)
Calcium: 8 mg/dL — ABNORMAL LOW (ref 8.9–10.3)
Chloride: 102 mmol/L (ref 98–111)
Creatinine, Ser: 0.64 mg/dL (ref 0.44–1.00)
GFR calc Af Amer: >60 mL/min (ref >60)
GFR calc non Af Amer: >60 mL/min (ref >60)
Glucose, Bld: 215 mg/dL — ABNORMAL HIGH (ref 70–99)
Potassium: 4.4 mmol/L (ref 3.5–5.1)
Sodium: 133 mmol/L — ABNORMAL LOW (ref 135–145)

## 2019-02-06 MED ORDER — FUROSEMIDE 20 MG PO TABS
30.0000 mg | ORAL_TABLET | Freq: Every day | ORAL | Status: DC
  Administered 2019-02-07: 30 mg via ORAL
  Filled 2019-02-06: qty 2

## 2019-02-06 MED ORDER — OXYCODONE HCL 5 MG PO TABS
5.0000 mg | ORAL_TABLET | ORAL | Status: DC | PRN
  Administered 2019-02-06 – 2019-02-13 (×8): 5 mg via ORAL
  Filled 2019-02-06 (×8): qty 1

## 2019-02-06 MED ORDER — LOSARTAN POTASSIUM 50 MG PO TABS
100.0000 mg | ORAL_TABLET | Freq: Every day | ORAL | Status: DC
  Filled 2019-02-06: qty 2

## 2019-02-06 MED ORDER — AMLODIPINE BESYLATE 5 MG PO TABS
5.0000 mg | ORAL_TABLET | Freq: Every day | ORAL | Status: DC
  Administered 2019-02-07: 5 mg via ORAL
  Filled 2019-02-06: qty 1

## 2019-02-06 NOTE — Progress Notes (Deleted)
HPI: FU hypertension andatrial flutter. History of atypical chest pain and palpitations. She had a exercise treadmill test on 01/26/11 which showed no evidence of ischemia. 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 Admitted July 2017 with atrial flutter vs atrial tachycardia and underwent TEE guided cardioversion. Echocardiogram October 2017 showed normal LV function, mild aortic insufficiency and mitral regurgitation and mild left atrial enlargement.Patient had recurrent atrial flutter May 2018 and had repeat cardioversion. Appointment was scheduled to see Dr. Lovena Le for consideration of ablation but he was not availableandseen by[place]enee [place]; she was inclined not to pursue procedures. Monitor March 2019 showed sinus with PACs and PVCs but no atrial flutter. Since last seen,  No current facility-administered medications for this visit.    Current Outpatient Medications  Medication Sig Dispense Refill  . oxyCODONE-acetaminophen (PERCOCET/ROXICET) 5-325 MG tablet Take 1 tablet by mouth every 4 (four) hours as needed for severe pain. 30 tablet 0  . tiZANidine (ZANAFLEX) 2 MG tablet Take 1 tablet (2 mg total) by mouth every 6 (six) hours as needed. 60 tablet 0   Facility-Administered Medications Ordered in Other Visits  Medication Dose Route Frequency Provider Last Rate Last Dose  . 0.9 %  sodium chloride infusion  10 mL/hr Intravenous Once Myrtie Soman, MD   Stopped at 02/06/19 0023  . acetaminophen (TYLENOL) tablet 1,000 mg  1,000 mg Oral Q6H Frederik Pear, MD   1,000 mg at 02/06/19 0521  . acetaminophen (TYLENOL) tablet 325-650 mg  325-650 mg Oral Q6H PRN Frederik Pear, MD      . alum & mag hydroxide-simeth (MAALOX/MYLANTA) 200-200-20 MG/5ML suspension 30 mL  30 mL Oral Q4H PRN Frederik Pear, MD      . amLODipine (NORVASC) tablet 5 mg  5 mg Oral Daily Frederik Pear, MD      . anastrozole (ARIMIDEX) tablet 1 mg  1 mg Oral Daily Frederik Pear, MD      . apixaban Arne [place]) tablet 2.5 mg  2.5 mg Oral Q12H Frederik Pear, MD      . benzonatate (TESSALON) capsule 100 mg  100 mg Oral Q8H PRN Frederik Pear, MD      . bisacodyl (DULCOLAX) EC tablet 5 mg  5 mg Oral Daily PRN Frederik Pear, MD      . celecoxib (CELEBREX) capsule 200 mg  200 mg Oral BID Frederik Pear, MD   200 mg at 02/05/19 2133  . Chlorhexidine Gluconate Cloth 2 % PADS 6 each  6 each Topical Q0600 Frederik Pear, MD      . cholecalciferol (VITAMIN D3) tablet 1,000 Units  1,000 Units Oral Daily Frederik Pear, MD   1,000 Units at 02/05/19 2139  . dexamethasone (DECADRON) injection 10 mg  10 mg Intravenous Once Frederik Pear, MD      . dextrose 5 % and 0.45 % NaCl with KCl 20 mEq/L infusion   Intravenous Continuous Frederik Pear, MD 125 mL/hr at 02/06/19 0600    . diphenhydrAMINE (BENADRYL) 12.5 MG/5ML elixir 12.5-25 mg  12.5-25 mg Oral Q4H PRN Frederik Pear, MD      . docusate sodium (COLACE) capsule 100 mg  100 mg Oral BID Frederik Pear, MD   100 mg at 02/05/19 2133  . furosemide (LASIX) tablet 30 mg  30 mg Oral Daily Frederik Pear, MD   30 mg at 02/05/19 2140  . gabapentin (NEURONTIN) capsule 300 mg  300 mg Oral TID Frederik Pear, MD   300 mg at 02/05/19 2133  . HYDROmorphone (DILAUDID) injection  0.5-1 mg  0.5-1 mg Intravenous Q4H PRN Frederik Pear, MD      . losartan (COZAAR) tablet 100 mg  100 mg Oral Daily Frederik Pear, MD   100 mg at 02/05/19 2139  . MEDLINE mouth rinse  15 mL Mouth Rinse BID Frederik Pear, MD      . menthol-cetylpyridinium (CEPACOL) lozenge 3 mg  1 lozenge Oral PRN Frederik Pear, MD       Or  . phenol (CHLORASEPTIC) mouth spray 1 spray  1 spray Mouth/Throat PRN Frederik Pear, MD      . methocarbamol (ROBAXIN) tablet 500 mg  500 mg Oral Q6H PRN Frederik Pear, MD       Or  . methocarbamol (ROBAXIN) 500 mg in dextrose 5 % 50 mL IVPB  500 mg Intravenous Q6H PRN Frederik Pear, MD 100 mL/hr at 02/05/19 1734 500 mg at 02/05/19 1734  . metoCLOPramide (REGLAN) tablet 5-10 mg  5-10 mg Oral Q8H PRN Frederik Pear, MD       Or  . metoCLOPramide  (REGLAN) injection 5-10 mg  5-10 mg Intravenous Q8H PRN Frederik Pear, MD      . ondansetron [facility]) tablet 4 mg  4 mg Oral Q6H PRN Frederik Pear, MD       Or  . ondansetron [facility]) injection 4 mg  4 mg Intravenous Q6H PRN Frederik Pear, MD      . oxyCODONE (Oxy IR/ROXICODONE) immediate release tablet 5-10 mg  5-10 mg Oral Q4H PRN Frederik Pear, MD      . pantoprazole (PROTONIX) EC tablet 40 mg  40 mg Oral Daily Frederik Pear, MD      . phenylephrine (NEOSYNEPHRINE) 10-0.9 MG/250ML-% infusion  0-400 mcg/min Intravenous Titrated Gary Fleet, PA-C 13.5 mL/hr at 02/06/19 0616 9 mcg/min at 02/06/19 0616  . polyethylene glycol (MIRALAX / GLYCOLAX) packet 17 g  17 g Oral Once per day on Mon Tue Wed Thu Fri Sat Frederik Pear, MD      . polyethylene glycol [facility]) packet 17 g  17 g Oral Daily PRN Frederik Pear, MD      . sertraline (ZOLOFT) tablet 25 mg  25 mg Oral Daily Frederik Pear, MD      . sodium phosphate (FLEET) 7-19 GM/118ML enema 1 enema  1 enema Rectal Once PRN Frederik Pear, MD      . temazepam (RESTORIL) capsule 15 mg  15 mg Oral QHS Frederik Pear, MD   15 mg at 02/05/19 2133     Past Medical History:  Diagnosis Date  . Aortic stenosis    Echo 02/2011 showing mild AS with normal LV systolic function  . Arthritis    "lower back" (10/09/2015)  . Atrial flutter with rapid ventricular response ([place]) 08/19/2016   s/p successful DCCV on 08/20/16, continue eliquis  . Chronic bronchitis ([place])    "off and on; several years" (10/09/2015)  . Chronic diastolic CHF (congestive heart failure) ([place])    a. 12/2015 Echo: EF 55-60%, mild AI/MR, mildly dil LA.  Marland Kitchen Chronic lower back pain   . Complication of anesthesia    "they had trouble waking me up after colon resection"  . Confusion    Originally listed as TIA-pt denies this hx on 10/09/2015 "it was the [place] I was taking; they had thought I was having a st originally roke"  . DDD (degenerative disc disease), lumbar    severe facet dz and  adv DDD MRI L spine 2009  . Diverticulosis   . Dysrhythmia  A-Fib  . Femur fracture, right ([place]) 10/18/2018  . GERD (gastroesophageal reflux disease)   . Hiatal hernia    hx  . Hypercholesterolemia   . Hypertension   . Insomnia   . OA (osteoarthritis) of knee   . Paroxysmal atrial flutter ([place])    a. 09/2015 s/p TEE/DCCV;  b. CHA2DS2VASc = 5-->Eliquis.  . Peripheral edema   . PVCs (premature ventricular contractions)   . Sacral fracture ([place]) 11/06/2012  . Vertigo 11/19/2012    Past Surgical History:  Procedure Laterality Date  . BREAST BIOPSY Left   . CARDIAC CATHETERIZATION  02/17/2001   MILD REGURGITATION. EF 60%  . CARDIOVERSION N/A 10/08/2015   Procedure: CARDIOVERSION;  Surgeon: Sanda Klein, MD;  Location: [place] ENDOSCOPY;  Service: Cardiovascular;  Laterality: N/A;  . CARDIOVERSION N/A 08/20/2016   Procedure: Cardioversion;  Surgeon: Evans Lance, MD;  Location: [place] [facility] CV LAB;  Service: Cardiovascular;  Laterality: N/A;  . CATARACT EXTRACTION W/ INTRAOCULAR LENS  IMPLANT, BILATERAL Bilateral   . CESAREAN SECTION  1954  . COLECTOMY     related to "blockage"  . COLONOSCOPY    . EXCISIONAL HEMORRHOIDECTOMY    . HIP PINNING,CANNULATED Right 10/19/2018   Procedure: Right percutaneous hip pinning;  Surgeon: Erle Crocker, MD;  Location: [facility];  Service: Orthopedics;  Laterality: Right;  . INGUINAL HERNIA REPAIR Bilateral   . KYPHOPLASTY Right 09/11/2012   Procedure: Right Acrylic Sacroplasty;  Surgeon: Kristeen Miss, MD;  Location: [place] [facility] NEURO ORS;  Service: Neurosurgery;  Laterality: Right;  Right  Acrylic Sacroplasty  . TEE WITHOUT CARDIOVERSION N/A 10/08/2015   Procedure: TRANSESOPHAGEAL ECHOCARDIOGRAM (TEE);  Surgeon: Sanda Klein, MD;  Location: [facility] ENDOSCOPY;  Service: Cardiovascular;  Laterality: N/A;  . TOTAL KNEE ARTHROPLASTY Right   . TOTAL MASTECTOMY Left 07/22/2017   Procedure: TOTAL MASTECTOMY;  Surgeon: Excell Seltzer, MD;  Location: [place] [facility];  Service:  General;  Laterality: Left;  . UMBILICAL HERNIA REPAIR      Social History   Socioeconomic History  . Marital status: Widowed    Spouse name: Not on file  . Number of children: Not on file  . Years of education: Not on file  . Highest education level: Not on file  Occupational History  . Not on file  Social Needs  . Financial resource strain: Not on file  . Food insecurity    Worry: Not on file    Inability: Not on file  . Transportation needs    Medical: Not on file    Non-medical: Not on file  Tobacco Use  . Smoking status: Never Smoker  . Smokeless tobacco: Never Used  Substance and Sexual Activity  . Alcohol use: No  . Drug use: No  . Sexual activity: Never  Lifestyle  . Physical activity    Days per week: Not on file    Minutes per session: Not on file  . Stress: Not on file  Relationships  . Social Herbalist on phone: Not on file    Gets together: Not on file    Attends religious service: Not on file    Active member of club or organization: Not on file    Attends meetings of clubs or organizations: Not on file    Relationship status: Not on file  . Intimate partner violence    Fear of current or ex partner: Not on file    Emotionally abused: Not on file    Physically abused: Not on file  Forced sexual activity: Not on file  Other Topics Concern  . 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 Heart disease Mother   . Hypertension Father   . Other Sister        73 in 2019  . Other Sister        59 in 2019  . Heart disease Sister   . Heart attack Neg Hx   . Stroke  Neg Hx     ROS: no fevers or chills, productive cough, hemoptysis, dysphasia, odynophagia, melena, hematochezia, dysuria, hematuria, rash, seizure activity, orthopnea, PND, pedal edema, claudication. Remaining systems are negative.  Physical Exam: Well-developed well-nourished in no acute distress.  Skin is warm and dry.  HEENT is normal.  Neck is supple.  Chest is clear to auscultation with normal expansion.  Cardiovascular exam is regular rate and rhythm.  Abdominal exam nontender or distended. No masses palpated. Extremities show no edema. neuro grossly intact  ECG- personally reviewed  A/P  1 history of atrial flutter-patient remains in sinus rhythm.  Continue apixaban.  Check hemoglobin and renal function.  Note patient's beta-blocker was discontinued previously because of bradycardia.  2 history of palpitations-previous monitor showed PACs and PVCs.  Beta-blocker discontinued previously because of bradycardia.  3 hypertension-blood pressure controlled.  Continue present medications and follow.  4 chronic diastolic congestive heart failure-patient appears to be euvolemic on examination today.  Continue present dose of Lasix.  Check potassium and renal function.  5 hyperlipidemia-continue statin.  Check lipids and liver.  6 history of mild aortic stenosis-patient does not have severe AS on examination today.  Kirk Ruths, MD

## 2019-02-06 NOTE — Evaluation (Signed)
Physical Therapy Evaluation Patient Details Name: Glenda [name] MRN: [id number] DOB: [birth year] Today's Date: 02/06/2019   History of Present Illness  Pt is 85 yo female s/p direct anterior R THA.  Pt with prior R femur fracture ealier this year.  PMH includes HTN, CHF, anxiety, see H and P for further history.  Post-op pt had low BP and was placed in ICU on Neosynephrine drip that has now been discontinued and pt cleared for PT.  Clinical Impression   Pt is s/p R direct anterior THA resulting in the deficits listed below (see PT Problem List). Pt requiring min-mod A for mobility with limited gait distance.  Pt had issues with hypotension post-op, so cautious activity with therapy today; however, pt tolerated with syncopal symptoms.  Pt is from ALF and plans to return with increased support from staff.  Pt will benefit from skilled PT to increase their independence and safety with mobility to allow discharge to the venue listed below.      Follow Up Recommendations Home health PT;Other (comment);Supervision/Assistance - 24 hour(at ALF)    Equipment Recommendations  Rolling walker with 5" wheels    Recommendations for Other Services       Precautions / Restrictions Precautions Precautions: Fall Precaution Comments: Monitor BP Restrictions Weight Bearing Restrictions: Yes RLE Weight Bearing: Weight bearing as tolerated      Mobility  Bed Mobility Overal bed mobility: Needs Assistance Bed Mobility: Sit to Supine       Sit to supine: Mod assist   General bed mobility comments: cues for technique  Transfers Overall transfer level: Needs assistance Equipment used: Rolling walker (2 wheeled) Transfers: Sit to/from Stand Sit to Stand: Mod assist         General transfer comment: performed x 2; cues for safe hand placement, stand straight, and for safety  Ambulation/Gait Ambulation/Gait assistance: Min assist Gait Distance (Feet): 3 Feet Assistive device: Rolling  walker (2 wheeled) Gait Pattern/deviations: Step-to pattern;Shuffle;Antalgic;Narrow base of support;Decreased stride length Gait velocity: significantly decreased   General Gait Details: 3'x2; limited by lines/leads and recently low BP (limited activity until see how pt tolerating)  Stairs            Wheelchair Mobility    Modified Rankin (Stroke Patients Only)       Balance Overall balance assessment: Needs assistance Sitting-balance support: Bilateral upper extremity supported;Feet unsupported Sitting balance-Leahy Scale: Poor Sitting balance - Comments: Somewhat limited at EOB due to height and air matteress on ICU bed   Standing balance support: Bilateral upper extremity supported Standing balance-Leahy Scale: Poor                               Pertinent Vitals/Pain Pain Assessment: 0-10 Pain Score: 5  Pain Location: R hip Pain Descriptors / Indicators: Discomfort;Sore Pain Intervention(s): Limited activity within patient's tolerance;Repositioned    Home Living Family/patient expects to be discharged to:: Assisted living  Pt reports and per notes: Pt returning to ALF were staff will be checking on her every 30 minutes and additionally she has call light to call for assist with transfers.  Pt was calling for assist prior to admission.  She will have meals delivered to room and HHPT.  Family not allowed to assist due to COVID restrictions at facility.                Home Equipment: [place] - 2 wheels;Walker - 4 wheels;Shower seat;Grab bars -  toilet;Grab bars - tub/shower      Prior Function Level of Independence: Needs assistance   Gait / Transfers Assistance Needed: Pt with a fall about 3 months ago and since that time her the staff at ALF have been providing supervision with gait.  She used a RW since fall, rollator prior.  Her ambulation has been limited since fall.  ADL's / Homemaking Assistance Needed: Pt had min A with showering and supervision  with toielting and dressing        Hand Dominance        Extremity/Trunk Assessment   Upper Extremity Assessment Upper Extremity Assessment: Overall WFL for tasks assessed    Lower Extremity Assessment Lower Extremity Assessment: RLE deficits/detail RLE Deficits / Details: R hip ROM < L hip due to pain but still WFL;  R hip strength 1/5, R knee 3/5, R ankle 5/5       Communication   Communication: No difficulties  Cognition Arousal/Alertness: Awake/alert Behavior During Therapy: WFL for tasks assessed/performed Overall Cognitive Status: Within Functional Limits for tasks assessed                                 General Comments: Needs cues to focus on task at hand at times for safety (Pt urinated when standing up and was very worried about that so was letting go of RW to wipe when she was not steady - required cues to focus on standing)      General Comments General comments (skin integrity, edema, etc.): BP maintained 108-114/50's throughout PT, pt with no syncopal symptoms.    Exercises     Assessment/Plan    PT Assessment Patient needs continued PT services  PT Problem List Decreased strength;Decreased mobility;Decreased safety awareness;Decreased range of motion;Decreased activity tolerance;Decreased balance;Cardiopulmonary status limiting activity;Decreased knowledge of use of DME       PT Treatment Interventions DME instruction;Therapeutic activities;Modalities;Gait training;Therapeutic exercise;Patient/family education;Balance training;Functional mobility training    PT Goals (Current goals can be found in the Care Plan section)  Acute Rehab PT Goals Patient Stated Goal: return to ALF with increased support PT Goal Formulation: With patient Time For Goal Achievement: 02/20/19 Potential to Achieve Goals: Good    Frequency 7X/week   Barriers to discharge        Co-evaluation               AM-PAC PT "6 Clicks" Mobility  Outcome  Measure Help needed turning from your back to your side while in a flat bed without using bedrails?: A Lot Help needed moving from lying on your back to sitting on the side of a flat bed without using bedrails?: A Lot Help needed moving to and from a bed to a chair (including a wheelchair)?: A Lot Help needed standing up from a chair using your arms (e.g., wheelchair or bedside chair)?: A Lot Help needed to walk in hospital room?: A Lot Help needed climbing 3-5 steps with a railing? : Total 6 Click Score: 11    End of Session Equipment Utilized During Treatment: Gait belt Activity Tolerance: Patient tolerated treatment well Patient left: in chair;with call bell/phone within reach Nurse Communication: Mobility status PT Visit Diagnosis: Unsteadiness on feet (R26.81);Other abnormalities of gait and mobility (R26.89);Muscle weakness (generalized) (M62.81);History of falling (Z91.81)    Time: [id number] PT Time Calculation (min) (ACUTE ONLY): 38 min   Charges:   PT Evaluation $PT Eval Moderate Complexity: 1 Mod  PT Treatments $Therapeutic Activity: 8-22 mins        [name], PT Acute Rehab Services Pager [phone] [facility] [place] 02/06/2019, 1:55 PM

## 2019-02-06 NOTE — Progress Notes (Signed)
Physical Therapy Treatment Patient Details Name: Glenda [name] MRN: [id number] DOB: December 24, 1924 Today's Date: 02/06/2019    History of Present Illness Pt is 85 yo female s/p direct anterior R THA.  Pt with prior R femur fracture ealier this year.  PMH includes HTN, CHF, anxiety, see H and P for further history.  Post-op pt had low BP and was placed in ICU on Neosynephrine drip that has now been discontinued and pt cleared for PT.    PT Comments    Pt able to take a few more steps (5' with RW), but remains limited by pain and lines/leads.  She is requiring mod A, increased time, and cues for all transfers and gait.  Pt needs cues for safety and to focus on task at hand.  She tends to posteriorly lean in standing.  Cont POC.     Follow Up Recommendations  Home health PT;Other (comment);Supervision/Assistance - 24 hour     Equipment Recommendations  Rolling walker with 5" wheels    Recommendations for Other Services       Precautions / Restrictions Precautions Precautions: Fall Precaution Comments: Monitor BP Restrictions Weight Bearing Restrictions: Yes RLE Weight Bearing: Weight bearing as tolerated    Mobility  Bed Mobility Overal bed mobility: Needs Assistance Bed Mobility: Supine to Sit       Sit to supine: Mod assist   General bed mobility comments: cues for technique  Transfers Overall transfer level: Needs assistance Equipment used: Rolling walker (2 wheeled) Transfers: Sit to/from Stand Sit to Stand: Mod assist         General transfer comment: performed sit to stand x 3; cues for safe hand placement, stand straight, and for safety  Ambulation/Gait Ambulation/Gait assistance: Min assist Gait Distance (Feet): 6 Feet Assistive device: Rolling walker (2 wheeled) Gait Pattern/deviations: Step-to pattern;Shuffle;Antalgic;Narrow base of support;Decreased stride length Gait velocity: significantly decreased   General Gait Details: tendency to posterior  lean; tactile cues and assist for posture and balance; fatigued easily   Stairs             Wheelchair Mobility    Modified Rankin (Stroke Patients Only)       Balance Overall balance assessment: Needs assistance Sitting-balance support: Bilateral upper extremity supported;Feet unsupported Sitting balance-Leahy Scale: Fair Sitting balance - Comments: Somewhat limited at EOB due to height and air matteress on ICU bed   Standing balance support: Bilateral upper extremity supported Standing balance-Leahy Scale: Poor Standing balance comment: posterior lean                            Cognition Arousal/Alertness: Awake/alert Behavior During Therapy: WFL for tasks assessed/performed Overall Cognitive Status: No family/caregiver present to determine baseline cognitive functioning(Pt did have some decreased safety awareness and forgetfullness during PT)                                 General Comments: Needs cues to focus on task at hand at times for safety (Pt urinated when standing up and was very worried about that so was letting go of RW to wipe when she was not steady - required cues to focus on standing)      Exercises Total Joint Exercises Ankle Circles/Pumps: AROM;10 reps;Supine Quad Sets: AROM;10 reps;Supine  Further exercises held due to pain    General Comments General comments (skin integrity, edema, etc.): VSS; also performed toielting  adls with max A for balance and cleaning -limited due to balance and lines/leads      Pertinent Vitals/Pain Pain Assessment: 0-10 Pain Score: 5  Pain Location: R hip Pain Descriptors / Indicators: Discomfort;Sore Pain Intervention(s): Patient requesting pain meds-RN notified;Monitored during session;Repositioned;Limited activity within patient's tolerance    Home Living Family/patient expects to be discharged to:: Assisted living             Home Equipment: Walker - 2 wheels;Walker - 4  wheels;Shower seat;Grab bars - toilet;Grab bars - tub/shower      Prior Function Level of Independence: Needs assistance  Gait / Transfers Assistance Needed: Pt with a fall about 3 months ago and since that time her the staff at ALF have been providing supervision with gait.  She used a RW since fall, rollator prior.  Her ambulation has been limited since fall. ADL's / Homemaking Assistance Needed: Pt had min A with showering and supervision with toielting and dressing     PT Goals (current goals can now be found in the care plan section) Acute Rehab PT Goals Patient Stated Goal: return to ALF with increased support PT Goal Formulation: With patient Time For Goal Achievement: 02/20/19 Potential to Achieve Goals: Good Progress towards PT goals: Progressing toward goals    Frequency    7X/week      PT Plan Current plan remains appropriate    Co-evaluation              AM-PAC PT "6 Clicks" Mobility   Outcome Measure  Help needed turning from your back to your side while in a flat bed without using bedrails?: A Lot Help needed moving from lying on your back to sitting on the side of a flat bed without using bedrails?: A Lot Help needed moving to and from a bed to a chair (including a wheelchair)?: A Lot Help needed standing up from a chair using your arms (e.g., wheelchair or bedside chair)?: A Lot Help needed to walk in hospital room?: A Lot Help needed climbing 3-5 steps with a railing? : Total 6 Click Score: 11    End of Session Equipment Utilized During Treatment: Gait belt Activity Tolerance: Patient tolerated treatment well Patient left: with call bell/phone within reach;in bed;with bed alarm set;with family/visitor present Nurse Communication: Mobility status PT Visit Diagnosis: Unsteadiness on feet (R26.81);Other abnormalities of gait and mobility (R26.89);Muscle weakness (generalized) (M62.81);History of falling (Z91.81)     Time: [id number] PT Time  Calculation (min) (ACUTE ONLY): 23 min  Charges:  $Gait Training: 8-22 mins $Therapeutic Activity: 8-22 mins                     [name], PT Acute Rehab Services Pager [phone] [facility] [phone] [facility] [phone]    Glenda [name] 02/06/2019, 4:26 PM

## 2019-02-06 NOTE — TOC Initial Note (Signed)
Transition of Care [facility]) - Initial/Assessment Note    Patient Details  Name: Glenda [name] MRN: [id number] Date of Birth: 1924-12-30  [facility] of [facility]) CM/SW Contact:    Nila Nephew, LCSW Phone Number: [phone] 02/06/2019, 10:15 AM  Clinical Narrative:    Pt admitted for hip fracture, now one day post op, from [facility] assisted living. Ortho Bundle arranged DC plan return to ALF with therapy. Spoke with Mariann Laster at [facility] today, she states pt has a walker at facility, cannot have 3n1/BC at facility however. They have arranged to assist her with ADLs and increased checks and meal delivery to room upon return. Will need FL2 provided at DC.                 Expected Discharge Plan: Assisted Living Barriers to Discharge: No Barriers Identified, Other (comment)(medical stability)   Patient Goals and CMS Choice Patient states their goals for this hospitalization and ongoing recovery are:: therapy CMS Medicare.gov Compare Post Acute Care list provided to:: Patient Choice offered to / list presented to : Patient  Expected Discharge Plan and Services Expected Discharge Plan: Assisted Living In-house Referral: Clinical Social Work   Post Acute Care Choice: [place] arrangements for the past 2 months: Assisted Living Facility                           HH Arranged: PT [facility] Agency: (will have PT at the ALF from [facility])        Prior Living Arrangements/Services Living arrangements for the past 2 months: [place] Lives with:: Facility Resident Patient language and need for interpreter reviewed:: Yes Do you feel safe going back to the place where you live?: Yes      Need for Family Participation in Patient Care: No (Comment) Care giver support system in place?: Yes (comment)(alf resident) Current home services: DME Criminal Activity/Legal Involvement Pertinent to Current Situation/Hospitalization: No - Comment as  needed  Activities of Daily Living      Permission Sought/Granted Permission sought to share information with : Facility Art therapist granted to share information with : Yes, Verbal Permission Granted  Share Information with NAME: [facility] assisted living           Emotional Assessment Appearance:: Appears stated age            Admission diagnosis:  [place] Patient Active Problem List   Diagnosis Date Noted  . Hypotension due to hypovolemia   . Status post total replacement of right hip 02/05/2019  . Osteoarthritis of right hip 02/01/2019  . Hyponatremia 10/19/2018  . Femur fracture, right ([place]) 10/18/2018  . Hip fracture ([place]) 10/18/2018  . Anxiety 08/08/2018  . Abdominal hernia 05/19/2018  . Chronic pain syndrome 02/09/2018  . Chronic narcotic use 08/11/2017  . Malignant neoplasm of upper-outer quadrant of left breast in female, estrogen receptor positive ([place]) 06/27/2017  . History of cardioversion 08/20/2016  . Atrial flutter ([place])   . Shortness of breath 12/13/2014  . Palpitations 12/13/2014  . Constipation 11/06/2012  . Accelerated/malignant hypertension 09/14/2012  . Aortic stenosis, mild 07/11/2012  . Chronic diastolic HF (heart failure) ([place]) 07/11/2012  . Osteoarthritis 04/03/2012  . Anemia 06/10/2011  . Essential hypertension 03/25/2011  . Insomnia 09/15/2010  . Hypercholesterolemia 09/15/2010   PCP:  Marin Olp, MD Pharmacy:   [facility] [place], [place] [address]  [place] [address] Phone: [phone] Fax: [phone]     Social Determinants of Health (SDOH) Interventions    Readmission Risk Interventions No flowsheet data found.

## 2019-02-06 NOTE — Consult Note (Signed)
NAME:  Glenda [name], MRN:  [id number], DOB:  March 05, 1925, LOS: 1 ADMISSION DATE:  02/05/2019, CONSULTATION DATE:  02/06/19 REFERRING MD:  Dr. Mayer Camel, CHIEF COMPLAINT:  Hypotension    Brief History   85 y/o F admitted 11/16 for planned right total hip arthroplasty with anterior approach in the setting of a right femoral neck non-union fracture.  Post-operative course complicated by hypotension in the setting of ABLA, sedation and anti-hypertensives.   History of present illness   85 y/o F, never smoker, who presented to [facility] on 11/16 for planned right total hip arthroplasty with anterior approach in the setting of a right femoral neck non-union.    She has a hx of HTN, GERD, hiatal hernia, PAF on Eliquis and chronic diastolic heart failure with LVEF 55-60% (07/2015).  Pre-op was cleared per Cardiology for surgery.  Operative note reflects she received spinal anesthesia and had an EBL of approximately 500 ml. She was treated with 1500 ml of crystalloid and tranexamic acid.  Post-operatively, she required low dose phenylephrine for MAP >60.  Hgb was noted to have decreased to 8.9 from 13.2 (pre-op).  She was transfused 1 unit PRBC with follow up Hgb of 9.6.  MAR review shows she received cozaar on the evening of 11/16.    PCCM consulted for assistance with vasopressor / medical management.   Past Medical History  GERD Hiatal Hernia  HTN  PAF - on eliquis  Chronic Diastolic CHF - LVEF 0000000  Significant Hospital Events   11/16 Admit for planned right total hip arthroplasty  11/17 PCCM consulted for hypotension requiring neosynephrine   Consults:  PCCM - 11/17  Procedures:  11/16 Right Hip ORIF  Significant Diagnostic Tests:    Micro Data:  COVID 11/12 >> negative   Antimicrobials:    Interim history/subjective:  Pt denies acute complaints.  Ate breakfast, ready to get her face cleaned up. RN reports pt remains on 104mcg's of neosynephrine.  600 ml UOP in last 24 hours.    Objective   Blood pressure (!) 79/48, pulse (!) 57, temperature 97.8 F (36.6 C), temperature source Oral, resp. rate 19, weight 53.1 kg, SpO2 99 %.        Intake/Output Summary (Last 24 hours) at 02/06/2019 0902 Last data filed at 02/06/2019 0815 Gross per 24 hour  Intake 5033.69 ml  Output 1200 ml  Net 3833.69 ml   Filed Weights   02/05/19 2020  Weight: 53.1 kg    Examination: General: frail elderly female lying in bed in NAD  HEENT: MM pink/dry, anicteric, pupils 5mm/reactive, lids/lashes normal, no jvd Neuro: AAOx4, speech clear, MAE, non-focal exam  CV: s1s2 rrr, SR-SB with PVC's on monitor, no m/r/g PULM: even/non-labored, lungs bilaterally clear GI: soft, bsx4 active  Extremities: warm/dry, right hip dressing clean/dry/intact, minimal bruising to site, no LE edema, TED hose in place  Skin: no rashes or lesions  Resolved Hospital Problem list      Assessment & Plan:   Hypotension  Suspect multifactorial in the setting of acute blood loss (EBL 500 intra-op), sedation and use of home anti-hypertensive regimen post-operatively.  -reschedule home regimen antihypertensives, diuretics for 11/18 -wean neosynephrine to off for MAP >60 with normal mental status & adequate UOP  -ICU monitoring  -reduce dosing of oxycodone > consider de-escalation to tramadol  -minimize sedating medications as able -change BP cuff to small adult   Acute Blood Loss Anemia  Component blood loss intra-op + hemodilution. No evidence of bleeding on exam.  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  -post operative recommendations per Ortho  -PT/OT efforts  -incentive spirometry  -bowel regimen with narcotics   Best practice:  Diet: Regular diet  DVT  prophylaxis: Eliquis  GI prophylaxis: n/a Glucose control: n/a  Mobility: as tolerated, per Ortho  Code Status: Full Code  Family Communication: Patient updated on plan of care 11/17 Disposition: ICU, Ortho    Labs   CBC: Recent Labs  Lab 01/30/19 1443 02/05/19 1816 02/06/19 0157  WBC 6.0  --  9.3  NEUTROABS 3.8  --   --   HGB 13.2 8.9* 9.6*  HCT 41.3 28.2* 30.2*  MCV 92.2  --  90.7  PLT 236  --  123456    Basic Metabolic Panel: Recent Labs  Lab 01/30/19 1443 02/06/19 0157  NA 134* 133*  K 4.1 4.4  CL 98 102  CO2 26 22  GLUCOSE 103* 215*  BUN 27* 20  CREATININE 0.74 0.64  CALCIUM 9.2 8.0*   GFR: Estimated Creatinine Clearance: 33.9 mL/min (by C-G formula based on SCr of 0.64 mg/dL). Recent Labs  Lab 01/30/19 1443 02/06/19 0157  WBC 6.0 9.3    Liver Function Tests: No results for input(s): AST, ALT, ALKPHOS, BILITOT, PROT, ALBUMIN in the last 168 hours. No results for input(s): LIPASE, AMYLASE in the last 168 hours. No results for input(s): AMMONIA in the last 168 hours.  ABG    Component Value Date/Time   TCO2 26 01/21/2011 1220     Coagulation Profile: Recent Labs  Lab 01/30/19 1443  INR 1.1    Cardiac Enzymes: No results for input(s): CKTOTAL, CKMB, CKMBINDEX, TROPONINI in the last 168 hours.  HbA1C: Hgb A1c MFr Bld  Date/Time Value Ref Range Status  08/19/2016 04:38 PM 5.7 (H) 4.8 - 5.6 % Final    Comment:    (NOTE)         Pre-diabetes: 5.7 - 6.4         Diabetes: >6.4         Glycemic control for adults with diabetes: <7.0     CBG: No results for input(s): GLUCAP in the last 168 hours.  Review of Systems: Positives in [place]   Gen: Denies fever, chills, weight change, fatigue, night sweats HEENT: Denies blurred vision, double vision, hearing loss, tinnitus, sinus congestion, rhinorrhea, sore throat, neck stiffness, dysphagia PULM: Denies shortness of breath, cough, sputum production, hemoptysis, wheezing CV: Denies chest pain,  edema, orthopnea, paroxysmal nocturnal dyspnea, palpitations GI: Denies abdominal pain, nausea, vomiting, diarrhea, hematochezia, melena, constipation, change in bowel habits GU: Denies dysuria, hematuria, polyuria, oliguria, urethral discharge Endocrine: Denies hot or cold intolerance, polyuria, polyphagia or appetite change Derm: Denies rash, dry skin, scaling or peeling skin change Heme: Denies easy bruising, bleeding, bleeding gums Neuro: Denies headache, numbness, weakness, slurred speech, loss of memory or consciousness MSK: right hip surgical pain   Past Medical History  She,  has a past medical history of Aortic stenosis, Arthritis, Atrial flutter with rapid ventricular response ([place]) (08/19/2016), Chronic bronchitis (HCC), Chronic diastolic CHF (congestive heart failure) (HCC), Chronic lower back pain, Complication of anesthesia, Confusion, DDD (degenerative disc disease), lumbar, Diverticulosis, Dysrhythmia, Femur fracture, right ([place]) (10/18/2018), GERD (gastroesophageal reflux disease), Hiatal hernia, Hypercholesterolemia, Hypertension, Insomnia, OA (osteoarthritis)  of knee, Paroxysmal atrial flutter ([place]), Peripheral edema, PVCs (premature ventricular contractions), Sacral fracture ([place]) (11/06/2012), and Vertigo (11/19/2012).   Surgical History    Past Surgical History:  Procedure Laterality Date  . BREAST BIOPSY Left   . CARDIAC CATHETERIZATION  02/17/2001   MILD REGURGITATION. EF 60%  . CARDIOVERSION N/A 10/08/2015   Procedure: CARDIOVERSION;  Surgeon: Sanda Klein, MD;  Location: [facility]COPY;  Service: Cardiovascular;  Laterality: N/A;  . CARDIOVERSION N/A 08/20/2016   Procedure: Cardioversion;  Surgeon: Evans Lance, MD;  Location: [facility] CV LAB;  Service: Cardiovascular;  Laterality: N/A;  . CATARACT EXTRACTION W/ INTRAOCULAR LENS  IMPLANT, BILATERAL Bilateral   . CESAREAN SECTION  1954  . COLECTOMY     related to "blockage"  . COLONOSCOPY    . EXCISIONAL  HEMORRHOIDECTOMY    . HIP PINNING,CANNULATED Right 10/19/2018   Procedure: Right percutaneous hip pinning;  Surgeon: Erle Crocker, MD;  Location: [facility];  Service: Orthopedics;  Laterality: Right;  . INGUINAL HERNIA REPAIR Bilateral   . KYPHOPLASTY Right 09/11/2012   Procedure: Right Acrylic Sacroplasty;  Surgeon: Kristeen Miss, MD;  Location: [facility];  Service: Neurosurgery;  Laterality: Right;  Right  Acrylic Sacroplasty  . TEE WITHOUT CARDIOVERSION N/A 10/08/2015   Procedure: TRANSESOPHAGEAL ECHOCARDIOGRAM (TEE);  Surgeon: Sanda Klein, MD;  Location: [facility] ENDOSCOPY;  Service: Cardiovascular;  Laterality: N/A;  . TOTAL KNEE ARTHROPLASTY Right   . TOTAL MASTECTOMY Left 07/22/2017   Procedure: TOTAL MASTECTOMY;  Surgeon: Excell Seltzer, MD;  Location: [facility];  Service: General;  Laterality: Left;  . UMBILICAL HERNIA REPAIR       Social History   reports that she has never smoked. She has never used smokeless tobacco. She reports that she does not drink alcohol or use drugs.   Family History   Her family history includes Heart disease in her mother and sister; Hypertension in her father; Other in her sister and sister. There is no history of Heart attack or Stroke.   Allergies Allergies  Allergen Reactions  . Ace Inhibitors Other (See Comments)    Cough  . Halcion [Triazolam]     UNSPECIFIED REACTION   . Pentazocine Lactate     UNSPECIFIED REACTION   . Sulfa Drugs Cross Reactors     UNSPECIFIED REACTION   . Trazodone And Nefazodone Other (See Comments)    Per MAR  . Amitriptyline Other (See Comments)    Hyperactivity  . Avelox [Moxifloxacin Hcl In Nacl] Other (See Comments)    dizziness  . Penicillins Rash     Home Medications  Prior to Admission medications   Medication Sig Start Date End Date Taking? Authorizing Provider  acetaminophen (TYLENOL) 500 MG tablet Take 1,000 mg by mouth every 6 (six) hours as needed for moderate pain.   Yes [provider]   amLODipine (NORVASC) 5 MG tablet Take 1 tablet (5 mg total) by mouth daily. 03/31/18  Yes Almyra Deforest, PA  anastrozole (ARIMIDEX) 1 MG tablet Take 1 tablet (1 mg total) by mouth daily. 10/10/18  Yes Nicholas Lose, MD  apixaban (ELIQUIS) 2.5 MG TABS tablet Take 1 tablet (2.5 mg total) by mouth 2 (two) times daily. 07/24/17  Yes Greer Pickerel, MD  Cholecalciferol 1000 units tablet Take 1,000 Units by mouth daily.   Yes [provider]  C[place]berry 425 MG CAPS Take 425 mg by mouth 2 (two) times daily.   Yes [provider]  docusate sodium (COLACE) 100 MG capsule Take  100 mg by mouth 2 (two) times daily.   Yes [provider]  furosemide (LASIX) 20 MG tablet Take 1.5 tablets (30 mg total) by mouth daily. 08/26/16  Yes Baldwin Jamaica, PA-C  HYDROcodone-acetaminophen (NORCO/VICODIN) 5-325 MG tablet Take 1 tablet by mouth every 6 (six) hours as needed for moderate pain or severe pain (may fill 01/15/2019). 12/05/18  Yes Marin Olp, MD  losartan (COZAAR) 100 MG tablet Take 1 tablet (100 mg total) by mouth daily. 07/12/17  Yes Marin Olp, MD  Multiple Vitamins-Minerals (CEROVITE ADVANCED FORMULA PO) Take 1 tablet by mouth daily. (0800)   Yes [provider]  omeprazole (PRILOSEC) 40 MG capsule Take 40 mg by mouth daily. 08/30/17  Yes [provider]  ondansetron (ZOFRAN) 4 MG tablet Take 4 mg by mouth every 6 (six) hours as needed for nausea.   Yes [provider]  polyethylene glycol (MIRALAX / GLYCOLAX) packet Take 17 g by mouth See admin instructions. Mix 17 grams in 8 ounces of juice/water and drink daily, Monday through Saturday   Yes [provider]  sertraline (ZOLOFT) 25 MG tablet TAKE (1) TABLET BY MOUTH ONCE DAILY. Patient taking differently: Take 25 mg by mouth daily. TAKE (1) TABLET BY MOUTH ONCE DAILY. 09/15/18  Yes Marin Olp, MD  temazepam (RESTORIL) 15 MG capsule TAKE (1) CAPSULE BY MOUTH AT BEDTIME. SHOULD SEPARATE FROM  HYDROCODONE BY AT LEAST 6 HOURS. Patient taking differently: Take 15 mg by mouth at bedtime. Should separate from hydrocodone by at least 6 hours 01/11/19  Yes Marin Olp, MD  benzonatate (TESSALON) 100 MG capsule Take 100 mg by mouth every 8 (eight) hours as needed for cough.    [provider]  HYDROcodone-acetaminophen (NORCO/VICODIN) 5-325 MG tablet Take 1 tablet by mouth every 6 (six) hours as needed for moderate pain (may fill 12/17/2018). Patient not taking: Reported on 01/23/2019 12/05/18   Marin Olp, MD  oxyCODONE-acetaminophen (PERCOCET/ROXICET) 5-325 MG tablet Take 1 tablet by mouth every 4 (four) hours as needed for severe pain. 02/05/19   Leighton Parody, PA-C  tiZANidine (ZANAFLEX) 2 MG tablet Take 1 tablet (2 mg total) by mouth every 6 (six) hours as needed. 02/05/19   Leighton Parody, PA-C     Critical care time: 30 minutes     Noe Gens, NP-C [facility] 02/06/2019, 9:02 AM

## 2019-02-06 NOTE — Progress Notes (Signed)
PATIENT ID: Glenda [name]  MRN: [id number]  DOB/AGE:  1924-12-08 / 85 y.o.  1 Day Post-Op Procedure(s) (LRB): RIGH TOTAL HIP ARTHROPLASTY ANTERIOR APPROACH AND REMOVAL OF SCREWS (Right)    PROGRESS NOTE Subjective: Patient is alert, oriented, no Nausea, no Vomiting, yes passing gas, . Taking PO well. Denies SOB, Chest or Calf Pain. Using Incentive Spirometer, PAS in place. Ambulate WBAT Patient reports pain as  2/10. Because of labile hypertension in the PACU the patient is now on step down on a Neo-Synephrine drip at 9.  Critical care will help with management of the patient.  Objective: Vital signs in last 24 hours: Vitals:   02/06/19 0600 02/06/19 0615 02/06/19 0700 02/06/19 0800  BP: (Abnormal) 137/50 136/64 (Abnormal) 123/42 (Abnormal) 133/49  Pulse: 60 63 (Abnormal) 57 63  Resp: 17 19 (Abnormal) 21 18  Temp:      TempSrc:      SpO2: 98% 98% 98% 98%  Weight:          Intake/Output from previous day: I/O last 3 completed shifts: In: 4793.7 [P.O.:480; I.V.:2947.4; Blood:716.3; IV Piggyback:650] Out: 1200 [Urine:600; Blood:600]   Intake/Output this shift: Total I/O In: 240 [P.O.:240] Out: -    LABORATORY DATA: Recent Labs    02/05/19 1816 02/06/19 0157  WBC  --  9.3  HGB 8.9* 9.6*  HCT 28.2* 30.2*  PLT  --  164  NA  --  133*  K  --  4.4  CL  --  102  CO2  --  22  BUN  --  20  CREATININE  --  0.64  GLUCOSE  --  215*  CALCIUM  --  8.0*    Examination: Neurologically intact ABD soft Neurovascular intact Sensation intact distally Intact pulses distally Dorsiflexion/Plantar flexion intact Incision: dressing C/D/I No cellulitis present Compartment soft} XR AP&Lat of hip shows well placed\fixed THA  Assessment:   1 Day Post-Op Procedure(s) (LRB): RIGH TOTAL HIP ARTHROPLASTY ANTERIOR APPROACH AND REMOVAL OF SCREWS (Right) ADDITIONAL DIAGNOSIS:  Expected Acute Blood Loss Anemia, History of palpitations,Atrial fibrillation, aortic stenosis, long-term use  of Eliquis, chronic pain syndrome, chronic diastolic heart failure Anticipated LOS equal to or greater than 2 midnights due to - Age 76 and older with one or more of the following:  - Obesity  - Expected need for hospital services (PT, OT, Nursing) required for safe  discharge  - Anticipated need for postoperative skilled nursing care or inpatient rehab     Plan: PT/OT WBAT, THA  DVT Prophylaxis: SCDx72 hrs, Apixaban 2.5 mg bid DISCHARGE PLAN: Home, When blood pressure becomes less labile and she passes PT goals  DISCHARGE NEEDS: HHPT, Walker and 3-in-1 comode seatPatient ID: Glenda [name], female   DOB: 10-04-1924, 85 y.o.   MRN: [id number]

## 2019-02-06 NOTE — Care Plan (Signed)
Post op events noted. Will follow. Anticipate returning to ALF as planned when medically stable.   Ladell Heads, [place]

## 2019-02-07 LAB — BASIC METABOLIC PANEL
Anion gap: 6 (ref 5–15)
BUN: 30 mg/dL — ABNORMAL HIGH (ref 8–23)
CO2: 21 mmol/L — ABNORMAL LOW (ref 22–32)
Calcium: 7.7 mg/dL — ABNORMAL LOW (ref 8.9–10.3)
Chloride: 105 mmol/L (ref 98–111)
Creatinine, Ser: 1.01 mg/dL — ABNORMAL HIGH (ref 0.44–1.00)
GFR calc Af Amer: 56 mL/min — ABNORMAL LOW (ref >60)
GFR calc non Af Amer: 48 mL/min — ABNORMAL LOW (ref >60)
Glucose, Bld: 165 mg/dL — ABNORMAL HIGH (ref 70–99)
Potassium: 4.8 mmol/L (ref 3.5–5.1)
Sodium: 132 mmol/L — ABNORMAL LOW (ref 135–145)

## 2019-02-07 LAB — CBC
HCT: 25.9 % — ABNORMAL LOW (ref 36.0–46.0)
Hemoglobin: 8.1 g/dL — ABNORMAL LOW (ref 12.0–15.0)
MCH: 29 pg (ref 26.0–34.0)
MCHC: 31.3 g/dL (ref 30.0–36.0)
MCV: 92.8 fL (ref 80.0–100.0)
Platelets: 126 10*3/uL — ABNORMAL LOW (ref 150–400)
RBC: 2.79 MIL/uL — ABNORMAL LOW (ref 3.87–5.11)
RDW: 14.6 % (ref 11.5–15.5)
WBC: 10.6 10*3/uL — ABNORMAL HIGH (ref 4.0–10.5)
nRBC: 0 % (ref 0.0–0.2)

## 2019-02-07 LAB — PREPARE RBC (CROSSMATCH)

## 2019-02-07 MED ORDER — AMLODIPINE BESYLATE 5 MG PO TABS: 5.0000 mg | ORAL_TABLET | Freq: Every day | ORAL | Status: DC

## 2019-02-07 MED ORDER — SODIUM CHLORIDE 0.9% IV SOLUTION: Freq: Once | INTRAVENOUS | Status: DC

## 2019-02-07 MED ORDER — FUROSEMIDE 20 MG PO TABS
30.0000 mg | ORAL_TABLET | Freq: Every day | ORAL | Status: DC
  Administered 2019-02-10 – 2019-02-14 (×4): 30 mg via ORAL
  Filled 2019-02-07 (×4): qty 2

## 2019-02-07 MED ORDER — LOSARTAN POTASSIUM 50 MG PO TABS: 100.0000 mg | ORAL_TABLET | Freq: Every day | ORAL | Status: DC

## 2019-02-07 NOTE — Plan of Care (Signed)
Plan of care reviewed and discussed with the patient. 

## 2019-02-07 NOTE — Progress Notes (Signed)
PCCM followed in ICU for post-operative hypotension. Off vasopressors, transferred out of unit overnight.  BP stable off home regimen.  Re-scheduled medications to start for tomorrow 11/19.  Would hold for 48 hours off vasopressors before restart.  PCCM will be available PRN.  Please call back if new needs arise.    Noe Gens, NP-C [facility] 02/07/2019, 9:42 AM

## 2019-02-07 NOTE — Progress Notes (Addendum)
Physical Therapy Treatment Patient Details Name: Glenda [name] MRN: [id number] DOB: 01-25-1925 Today's Date: 02/07/2019    History of Present Illness Pt is 85 yo female s/p direct anterior R THA.  Pt with prior R femur fracture ealier this year.  PMH includes HTN, CHF, anxiety, see H and P for further history.  Post-op pt had low BP and was placed in ICU on Neosynephrine drip that has now been discontinued and pt cleared for PT.  Pt has been transferred to the orthopedic floor.  RN reports she had resumed IV fluids since orthostatic bp this am.    PT Comments    Pt continues to be limited by orthostatic hypotension and tachycardia.  She participated in bed exercises prior to attempting OOB mobility.  With OOB mobility, pt again with symptomatic hypotension in standing.  Notified RN.  Continues to require mod A for transfers and limited ambulation.  Posterior lean with standing.  Cont POC.   Pt is requiring SNF level of care - reports that her ALF will provide increased support and assist with transfers-spoke with son who confirmed that the staff will be assisting pt with all transfers.  Notified RN of vitals.   Follow Up Recommendations  Home health PT;Other (comment);Supervision/Assistance - 24 hour     Equipment Recommendations  Rolling walker with 5" wheels    Recommendations for Other Services       Precautions / Restrictions Precautions Precautions: Fall Precaution Comments: Monitor BP Restrictions RLE Weight Bearing: Weight bearing as tolerated    Mobility  Bed Mobility Overal bed mobility: Needs Assistance       Supine to sit: Mod assist Sit to supine: Mod assist   General bed mobility comments: mod A and cues for transfers  Transfers Overall transfer level: Needs assistance Equipment used: Rolling walker (2 wheeled) Transfers: Sit to/from Stand Sit to Stand: Mod assist         General transfer comment: performed x 2; cues for safe hand placement, posture,  and safety  Ambulation/Gait Ambulation/Gait assistance: Mod assist Gait Distance (Feet): 2 Feet Assistive device: Rolling walker (2 wheeled) Gait Pattern/deviations: Step-to pattern;Shuffle;Antalgic;Narrow base of support;Decreased stride length Gait velocity: significantly decreased   General Gait Details: only side steps up HOB due to orthostatic bp   Stairs             Wheelchair Mobility    Modified Rankin (Stroke Patients Only)       Balance Overall balance assessment: Needs assistance Sitting-balance support: Bilateral upper extremity supported;Feet unsupported Sitting balance-Leahy Scale: Fair Sitting balance - Comments: Somewhat limited at EOB due to height and air matteress on ICU bed   Standing balance support: Bilateral upper extremity supported;During functional activity Standing balance-Leahy Scale: Zero Standing balance comment: posterior lean; tactile, verbal, and visual cues to corect                            Cognition Arousal/Alertness: Awake/alert Behavior During Therapy: WFL for tasks assessed/performed Overall Cognitive Status: No family/caregiver present to determine baseline cognitive functioning                                 General Comments: Needs cues to focus on task at hand for safety; Pt with forgetfullness at times      Exercises Total Joint Exercises Ankle Circles/Pumps: AROM;10 reps;Both;Supine Quad Sets: AROM;10 reps;Both;Supine Gluteal Sets: AROM;10 reps;Both;Supine  Short Arc Quad: AROM;Right;10 reps;Supine Heel Slides: AAROM;10 reps;Right;Supine Hip ABduction/ADduction: AAROM;Right;10 reps;Supine Long Arc Quad: AROM;Both;10 reps;Seated    General Comments General comments (skin integrity, edema, etc.):     Pertinent Vitals/Pain Pain Assessment: No/denies pain   Orthostatic BPs  Supine 97/61 and HR 86  Sitting 91/69 and HR 87     Standing 76/38 and HR 120  Standing after 3 min 81/53 and  HR 115  Return to supine  97/52 and HR 107    Home Living                      Prior Function            PT Goals (current goals can now be found in the care plan section) Progress towards PT goals: Progressing toward goals    Frequency    7X/week      PT Plan Current plan remains appropriate(need to confirm that ALF will provide SNF level of care- spoke with son who reports staff will be assisting with transfers.)    Co-evaluation              AM-PAC PT "6 Clicks" Mobility   Outcome Measure  Help needed turning from your back to your side while in a flat bed without using bedrails?: A Lot Help needed moving from lying on your back to sitting on the side of a flat bed without using bedrails?: A Lot Help needed moving to and from a bed to a chair (including a wheelchair)?: A Lot Help needed standing up from a chair using your arms (e.g., wheelchair or bedside chair)?: A Lot Help needed to walk in hospital room?: A Lot Help needed climbing 3-5 steps with a railing? : Total 6 Click Score: 11    End of Session Equipment Utilized During Treatment: Gait belt Activity Tolerance: Other (comment) Patient left: with call bell/phone within reach;in bed;with bed alarm set;with family/visitor present Nurse Communication: Mobility status PT Visit Diagnosis: Unsteadiness on feet (R26.81);Other abnormalities of gait and mobility (R26.89);Muscle weakness (generalized) (M62.81);History of falling (Z91.81)     Time: 1345-1416 PT Time Calculation (min) (ACUTE ONLY): 31 min  Charges:   $Therapeutic Exercise: 8-22 mins $Therapeutic Activity: 8-22 mins                     [name], PT Acute Rehab Services Pager [phone] [facility] [phone] [facility] [phone]    [name] 02/07/2019, 2:36 PM

## 2019-02-07 NOTE — Progress Notes (Signed)
Physical Therapy Treatment Patient Details Name: Glenda [name] MRN: [id number] DOB: November 05, 1924 Today's Date: 02/07/2019    History of Present Illness Pt is 85 yo female s/p direct anterior R THA.  Pt with prior R femur fracture ealier this year.  PMH includes HTN, CHF, anxiety, see H and P for further history.  Post-op pt had low BP and was placed in ICU on Neosynephrine drip that has now been discontinued and pt cleared for PT.  Pt has been transferred to the orthopedic floor.    PT Comments    Pt continues to required mod-max A for transfers and gait.  She participated with exercises prior to transfers.  With ambulation and transfers, pt with orthostatic hypotension requiring return to supine.  She required increased time, cues, and skilled assistance for transfer techniques and vital monitoring.  Will cont to benefit from PT.     Follow Up Recommendations  Home health PT;Other (comment);Supervision/Assistance - 24 hour(per notes ALF providing increased support)     Equipment Recommendations  Rolling walker with 5" wheels    Recommendations for Other Services       Precautions / Restrictions Precautions Precautions: Fall Precaution Comments: Monitor BP Restrictions RLE Weight Bearing: Weight bearing as tolerated    Mobility  Bed Mobility           Sit to supine: Max assist   General bed mobility comments: in chair at arrival; required max A to return to supine due to orthostatic hypotension and pt following decreased commands  Transfers Overall transfer level: Needs assistance Equipment used: Rolling walker (2 wheeled) Transfers: Sit to/from Stand Sit to Stand: Mod assist         General transfer comment: performed x 4; cues for safe hand placement, posture and safety  Ambulation/Gait Ambulation/Gait assistance: Mod assist Gait Distance (Feet): 3 Feet Assistive device: Rolling walker (2 wheeled) Gait Pattern/deviations: Step-to  pattern;Shuffle;Antalgic;Narrow base of support;Decreased stride length Gait velocity: significantly decreased   General Gait Details: 3'x3;  tendency to posterior lean; tactile cues and assist for posture and balance; fatigued easily.  Ambulated chair to bsc, bsc partially to bed became dizzy, sat back on bsc which was then slide close to bed and transferred bsd to bed (tech into assist at that point)   Marine scientist Rankin (Stroke Patients Only)       Balance Overall balance assessment: Needs assistance Sitting-balance support: Bilateral upper extremity supported;Feet unsupported Sitting balance-Leahy Scale: Fair     Standing balance support: Bilateral upper extremity supported;During functional activity Standing balance-Leahy Scale: Zero Standing balance comment: posterior lean; tactile, verbal, and visual cues to corect                            Cognition Arousal/Alertness: Awake/alert Behavior During Therapy: WFL for tasks assessed/performed Overall Cognitive Status: No family/caregiver present to determine baseline cognitive functioning                                 General Comments: Needs cues to focus on task at hand for safety; Pt with forgetfullness at times      Exercises Total Joint Exercises Ankle Circles/Pumps: AROM;10 reps;Seated;Both Quad Sets: AROM;10 reps;Seated;Both Gluteal Sets: AROM;10 reps;Both;Seated Heel Slides: AAROM;10 reps;Right;Seated Hip ABduction/ADduction: AAROM;Right;10 reps;Seated    General Comments General comments (  skin integrity, edema, etc.): Pt became less responsive and pale with transfer back to bed.  Called nurse tech to assist and for dynamap.  Once sitting checked and BP was 87/49 and HR 100.  Notified RN.  Returned pt to supine.  Pt feeling better in supine, RN present.      Pertinent Vitals/Pain Pain Assessment: No/denies pain    Home Living                       Prior Function            PT Goals (current goals can now be found in the care plan section) Progress towards PT goals: Progressing toward goals    Frequency    7X/week      PT Plan Current plan remains appropriate    Co-evaluation              AM-PAC PT "6 Clicks" Mobility   Outcome Measure  Help needed turning from your back to your side while in a flat bed without using bedrails?: A Lot Help needed moving from lying on your back to sitting on the side of a flat bed without using bedrails?: A Lot Help needed moving to and from a bed to a chair (including a wheelchair)?: A Lot Help needed standing up from a chair using your arms (e.g., wheelchair or bedside chair)?: A Lot Help needed to walk in hospital room?: A Lot Help needed climbing 3-5 steps with a railing? : Total 6 Click Score: 11    End of Session Equipment Utilized During Treatment: Gait belt Activity Tolerance: Other (comment)(limited by orthostatic hypotension) Patient left: with call bell/phone within reach;in bed;with bed alarm set;with family/visitor present Nurse Communication: Mobility status(BP) PT Visit Diagnosis: Unsteadiness on feet (R26.81);Other abnormalities of gait and mobility (R26.89);Muscle weakness (generalized) (M62.81);History of falling (Z91.81)     Time: [id number] PT Time Calculation (min) (ACUTE ONLY): 40 min  Charges:  $Gait Training: 8-22 mins $Therapeutic Exercise: 8-22 mins $Therapeutic Activity: 8-22 mins                     [name], PT Acute Rehab Services Pager [phone] [facility] [phone]    [name] 02/07/2019, 2:30 PM

## 2019-02-07 NOTE — Progress Notes (Signed)
PATIENT ID: Glenda [name]  MRN: [id number]  DOB/AGE:  1925/01/16 / 85 y.o.  2 Days Post-Op Procedure(s) (LRB): RIGH TOTAL HIP ARTHROPLASTY ANTERIOR APPROACH AND REMOVAL OF SCREWS (Right)    PROGRESS NOTE Subjective: Patient is alert, oriented, no Nausea, no Vomiting, yes passing gas, . Taking PO well. Denies SOB, Chest or Calf Pain. Using Incentive Spirometer, PAS in place. Ambulate WBAT with pt walking 6 ft with therapy Patient reports pain as 5/10 .    Objective: Vital signs in last 24 hours: Vitals:   02/06/19 1600 02/06/19 2008 02/06/19 2225 02/07/19 0608  BP: (!) 132/57  (!) 145/72 131/65  Pulse: 88 64 67 65  Resp: 20 16 16 16   Temp: (!) 97.5 F (36.4 C)  97.7 F (36.5 C) 97.6 F (36.4 C)  TempSrc: Oral     SpO2: 97%  99% 98%  Weight:      Height:          Intake/Output from previous day: I/O last 3 completed shifts: In: 4708.4 [P.O.:960; I.V.:2982.1; Blood:716.3; IV Piggyback:50] Out: [id number] [Urine:875]   Intake/Output this shift: Total I/O In: 240 [P.O.:240] Out: -    LABORATORY DATA: Recent Labs    02/06/19 0157 02/07/19 0202  WBC 9.3 10.6*  HGB 9.6* 8.1*  HCT 30.2* 25.9*  PLT 164 126*  NA 133* 132*  K 4.4 4.8  CL 102 105  CO2 22 21*  BUN 20 30*  CREATININE 0.64 1.01*  GLUCOSE 215* 165*  CALCIUM 8.0* 7.7*    Examination: Neurologically intact Neurovascular intact Sensation intact distally Intact pulses distally Dorsiflexion/Plantar flexion intact Incision: dressing C/D/I No cellulitis present Compartment soft} XR AP&Lat of hip shows well placed\fixed THA  Assessment:   2 Days Post-Op Procedure(s) (LRB): RIGH TOTAL HIP ARTHROPLASTY ANTERIOR APPROACH AND REMOVAL OF SCREWS (Right) ADDITIONAL DIAGNOSIS:  Expected Acute Blood Loss Anemia, History of palpitations,Atrial fibrillation, aortic stenosis, long-term use of Eliquis, chronic pain syndrome, chronic diastolic heart failure Anticipated LOS equal to or greater than 2 midnights due to - Age  39 and older with one or more of the following:  - Obesity  - Expected need for hospital services (PT, OT, Nursing) required for safe  discharge  - Anticipated need for postoperative skilled nursing care or inpatient rehab  - Active co-morbidities: Hypotension OR   - Unanticipated findings during/Post Surgery: Slow post-op progression: GI, pain control, mobility    Plan:  PT/OT WBAT, THA  DVT Prophylaxis: SCDx72 hrs, Apixaban 2.5 mg BID  DISCHARGE PLAN: Home  DISCHARGE NEEDS: HHPT, Walker and 3-in-1 comode seat   Hold BP vasopressors for 48 Hrs per critical care team

## 2019-02-08 LAB — BPAM RBC
Blood Product Expiration Date: 202012132359
Blood Product Expiration Date: 202012132359
Blood Product Expiration Date: 202012172359
ISSUE DATE / TIME: 202011162210
ISSUE DATE / TIME: 202011181538
ISSUE DATE / TIME: 202011182100
Unit Type and Rh: 9500
Unit Type and Rh: 9500
Unit Type and Rh: 9500

## 2019-02-08 LAB — CBC
HCT: 32.5 % — ABNORMAL LOW (ref 36.0–46.0)
Hemoglobin: 10.6 g/dL — ABNORMAL LOW (ref 12.0–15.0)
MCH: 29.4 pg (ref 26.0–34.0)
MCHC: 32.6 g/dL (ref 30.0–36.0)
MCV: 90.3 fL (ref 80.0–100.0)
Platelets: 143 10*3/uL — ABNORMAL LOW (ref 150–400)
RBC: 3.6 MIL/uL — ABNORMAL LOW (ref 3.87–5.11)
RDW: 14.6 % (ref 11.5–15.5)
WBC: 7.5 10*3/uL (ref 4.0–10.5)
nRBC: 0 % (ref 0.0–0.2)

## 2019-02-08 LAB — TYPE AND SCREEN
ABO/RH(D): O NEG
Antibody Screen: NEGATIVE
Unit division: 0
Unit division: 0
Unit division: 0

## 2019-02-08 NOTE — NC FL2 (Signed)
[place] LEVEL OF CARE SCREENING TOOL     IDENTIFICATION  Patient Name: Glenda [name] Birthdate: [birth year] Sex: female Admission Date (Current Location): 02/05/2019  [facility] and [place] Number:  Herbalist and Address:  [facility],  [place] [address] [place]      Provider Number: [id number]  Attending Physician Name and Address:  Frederik Pear, MD  Relative Name and Phone Number:       Current Level of Care: Hospital Recommended Level of Care: [place] Prior Approval Number:    Date Approved/Denied:   PASRR Number:    Discharge Plan: Other (Comment)(ALF)    Current Diagnoses: Patient Active Problem List   Diagnosis Date Noted  . Hypotension due to hypovolemia   . Status post total replacement of right hip 02/05/2019  . Osteoarthritis of right hip 02/01/2019  . Hyponatremia 10/19/2018  . Femur fracture, right ([place]) 10/18/2018  . Hip fracture ([place]) 10/18/2018  . Anxiety 08/08/2018  . Abdominal hernia 05/19/2018  . Chronic pain syndrome 02/09/2018  . Chronic narcotic use 08/11/2017  . Malignant neoplasm of upper-outer quadrant of left breast in female, estrogen receptor positive ([place]) 06/27/2017  . History of cardioversion 08/20/2016  . Atrial flutter ([place])   . Shortness of breath 12/13/2014  . Palpitations 12/13/2014  . Constipation 11/06/2012  . Accelerated/malignant hypertension 09/14/2012  . Aortic stenosis, mild 07/11/2012  . Chronic diastolic HF (heart failure) ([place]) 07/11/2012  . Osteoarthritis 04/03/2012  . Anemia 06/10/2011  . Essential hypertension 03/25/2011  . Insomnia 09/15/2010  . Hypercholesterolemia 09/15/2010    Orientation RESPIRATION BLADDER Height & Weight     Self, Time, Place, Situation  Normal Continent Weight: 116 lb 13.5 oz (53 kg) Height:  5\' 1"  (154.9 cm)  BEHAVIORAL SYMPTOMS/MOOD NEUROLOGICAL BOWEL NUTRITION STATUS      Continent Diet(Regular Diet)  AMBULATORY  STATUS COMMUNICATION OF NEEDS Skin   Extensive Assist Verbally Normal                       Personal Care Assistance Level of Assistance  Bathing, Feeding, Dressing Bathing Assistance: Limited assistance Feeding assistance: Independent Dressing Assistance: Maximum assistance     Functional Limitations Info  Sight, Hearing, Speech Sight Info: Adequate Hearing Info: Adequate Speech Info: Adequate    SPECIAL CARE FACTORS FREQUENCY  PT (By licensed PT), OT (By licensed OT)     PT Frequency: 5x/week OT Frequency: 5x/week            Contractures Contractures Info: Not present    Additional Factors Info  Code Status, Allergies, Psychotropic Code Status Info: Fullcode Allergies Info: Allergies: Ace Inhibitors, Halcion Triazolam, Pentazocine Lactate, Sulfa Drugs Cross Reactors, Trazodone And Nefazodone, Amitriptyline, Avelox Moxifloxacin Hcl In Nacl, Penicillins Psychotropic Info: Zoloft         Current Medications (02/08/2019):  This is the current hospital active medication list Current Facility-Administered Medications  Medication Dose Route Frequency Provider Last Rate Last Dose  . 0.9 %  sodium chloride infusion (Manually program via Guardrails IV Fluids)   Intravenous Once Leighton Parody, PA-C      . 0.9 %  sodium chloride infusion (Manually program via Guardrails IV Fluids)   Intravenous Once Leighton Parody, PA-C      . 0.9 %  sodium chloride infusion  10 mL/hr Intravenous Once Myrtie Soman, MD   Stopped at 02/06/19 0023  . acetaminophen (TYLENOL) tablet 325-650 mg  325-650 mg Oral Q6H  PRN Frederik Pear, MD      . alum & mag hydroxide-simeth (MAALOX/MYLANTA) 200-200-20 MG/5ML suspension 30 mL  30 mL Oral Q4H PRN Frederik Pear, MD      . amLODipine (NORVASC) tablet 5 mg  5 mg Oral Daily Ollis, Brandi L, NP      . anastrozole (ARIMIDEX) tablet 1 mg  1 mg Oral Daily Frederik Pear, MD   1 mg at 02/08/19 [id number]  . apixaban (ELIQUIS) tablet 2.5 mg  2.5 mg Oral Q12H Frederik Pear, MD   2.5 mg at 02/08/19 0753  . benzonatate (TESSALON) capsule 100 mg  100 mg Oral Q8H PRN Frederik Pear, MD      . bisacodyl (DULCOLAX) EC tablet 5 mg  5 mg Oral Daily PRN Frederik Pear, MD      . celecoxib (CELEBREX) capsule 200 mg  200 mg Oral BID Frederik Pear, MD   200 mg at 02/08/19 [id number]  . Chlorhexidine Gluconate Cloth 2 % PADS 6 each  6 each Topical Q0600 Frederik Pear, MD   6 each at 02/06/19 [phone]  . cholecalciferol (VITAMIN D3) tablet 1,000 Units  1,000 Units Oral Daily Frederik Pear, MD   1,000 Units at 02/08/19 0924  . dextrose 5 % and 0.45 % NaCl with KCl 20 mEq/L infusion   Intravenous Continuous Frederik Pear, MD 30 mL/hr at 02/07/19 0555    . diphenhydrAMINE (BENADRYL) 12.5 MG/5ML elixir 12.5-25 mg  12.5-25 mg Oral Q4H PRN Frederik Pear, MD      . docusate sodium (COLACE) capsule 100 mg  100 mg Oral BID Frederik Pear, MD   100 mg at 02/08/19 [id number]  . furosemide (LASIX) tablet 30 mg  30 mg Oral Daily Ollis, Brandi L, NP      . gabapentin (NEURONTIN) capsule 300 mg  300 mg Oral TID Frederik Pear, MD   300 mg at 02/08/19 [id number]  . HYDROmorphone (DILAUDID) injection 0.5-1 mg  0.5-1 mg Intravenous Q4H PRN Frederik Pear, MD      . losartan (COZAAR) tablet 100 mg  100 mg Oral Daily Ollis, Brandi L, NP      . MEDLINE mouth rinse  15 mL Mouth Rinse BID Frederik Pear, MD   15 mL at 02/07/19 2157  . menthol-cetylpyridinium (CEPACOL) lozenge 3 mg  1 lozenge Oral PRN Frederik Pear, MD       Or  . phenol (CHLORASEPTIC) mouth spray 1 spray  1 spray Mouth/Throat PRN Frederik Pear, MD      . methocarbamol (ROBAXIN) tablet 500 mg  500 mg Oral Q6H PRN Frederik Pear, MD       Or  . methocarbamol (ROBAXIN) 500 mg in dextrose 5 % 50 mL IVPB  500 mg Intravenous Q6H PRN Frederik Pear, MD 100 mL/hr at 02/05/19 1734 500 mg at 02/05/19 1734  . metoCLOPramide (REGLAN) tablet 5-10 mg  5-10 mg Oral Q8H PRN Frederik Pear, MD       Or  . metoCLOPramide (REGLAN) injection 5-10 mg  5-10 mg Intravenous Q8H PRN Frederik Pear,  MD      . ondansetron [facility]) tablet 4 mg  4 mg Oral Q6H PRN Frederik Pear, MD       Or  . ondansetron [facility]) injection 4 mg  4 mg Intravenous Q6H PRN Frederik Pear, MD      . oxyCODONE (Oxy IR/ROXICODONE) immediate release tablet 5 mg  5 mg Oral Q4H PRN Noe Gens L, NP   5 mg at 02/07/19 0928  . pantoprazole (PROTONIX)  EC tablet 40 mg  40 mg Oral Daily Frederik Pear, MD   40 mg at 02/08/19 [id number]  . polyethylene glycol (MIRALAX / GLYCOLAX) packet 17 g  17 g Oral Once per day on Mon Tue Wed Thu Fri Sat Frederik Pear, MD   17 g at 02/08/19 0930  . polyethylene glycol (MIRALAX / GLYCOLAX) packet 17 g  17 g Oral Daily PRN Frederik Pear, MD      . sertraline (ZOLOFT) tablet 25 mg  25 mg Oral Daily Frederik Pear, MD   25 mg at 02/08/19 [id number]  . sodium phosphate (FLEET) 7-19 GM/118ML enema 1 enema  1 enema Rectal Once PRN Frederik Pear, MD      . temazepam (RESTORIL) capsule 15 mg  15 mg Oral QHS Frederik Pear, MD   15 mg at 02/07/19 2154     Discharge Medications: Please see discharge summary for a list of discharge medications.  Relevant Imaging Results:  Relevant Lab Results:   Additional Information [place] Alyanah Elliott, [place]

## 2019-02-08 NOTE — TOC Progression Note (Signed)
Transition of Care [facility]) - Progression Note    Patient Details  Name: Glenda [name] MRN: [id number] Date of Birth: [birth year]  Transition of Care [facility]) CM/SW [place], LCSW Phone Number: 02/08/2019, 4:18 PM  Clinical Narrative:    COVID-19 test pending.  CSW sent updated clinical information to [place], [place] [phone]. FL2 will need to be updated with discharge medications.  Facility will accept the patient 11/20.    Expected Discharge Plan: Assisted Living Barriers to Discharge: Other (comment)(COVID-19 test pending)  Expected Discharge Plan and Services Expected Discharge Plan: Assisted Living In-house Referral: Clinical Social Work   Post Acute Care Choice: [place] arrangements for the past 2 months: Assisted Living Facility                           HH Arranged: PT [facility] Agency: (will have PT at the ALF from [facility])         Social Determinants of Health (SDOH) Interventions    Readmission Risk Interventions Readmission Risk Prevention Plan 02/06/2019  Transportation Screening Complete  PCP or Specialist Appt within 3-5 Days Not Complete  Not Complete comments DC date unknown but established with providers  [place] or [place] Complete  Social Work Consult for [place] Planning/Counseling Complete  Palliative Care Screening Not Complete  Palliative Care Screening Not Complete Comments no desire  Medication Review Press photographer) Referral to Pharmacy  Some recent data might be hidden

## 2019-02-08 NOTE — Progress Notes (Signed)
Physical Therapy Treatment Patient Details Name: Glenda [name] MRN: [id number] DOB: [birth year] Today's Date: 02/08/2019    History of Present Illness Pt is 85 yo female s/p direct anterior R THA.  Pt with prior R femur fracture ealier this year.  PMH includes HTN, CHF, anxiety, see H and P for further history.  Post-op pt has had difficulty with hypotension. She received 2 units of PRBC since last PT visit on 02/07/19.Marland Kitchen    PT Comments    Pt with slight improvement in exercise/activity tolerance today.  Her HR ranged from 109-123  bpm and at 85 yo this limited her activity.  Pt still with orthostatic BP today, but slightly improved and less symptomatic, although she was c/o shakiness in standing.  She demonstrated slight improvement in gait and tolerated exercises well.  Cont POC.   Follow Up Recommendations  Home health PT;Other (comment);Supervision/Assistance - 24 hour     Equipment Recommendations  Rolling walker with 5" wheels    Recommendations for Other Services       Precautions / Restrictions Precautions Precautions: Fall Precaution Comments: Monitor BP Restrictions RLE Weight Bearing: Weight bearing as tolerated    Mobility  Bed Mobility Overal bed mobility: Needs Assistance         Sit to supine: Mod assist   General bed mobility comments: going to bsc at arrival with nurse tech;  required cues for sit to supine and assist with legs  Transfers Overall transfer level: Needs assistance Equipment used: Rolling walker (2 wheeled) Transfers: Sit to/from Stand Sit to Stand: Min assist         General transfer comment: performed sit to stand x 3 with frequent cues for hand placement and posture;  performed toileting adls with max A  Ambulation/Gait Ambulation/Gait assistance: Min assist Gait Distance (Feet): 5 Feet Assistive device: Rolling walker (2 wheeled) Gait Pattern/deviations: Step-to pattern;Shuffle;Antalgic;Narrow base of support;Decreased stride  length Gait velocity: significantly decreased   General Gait Details: 5'x2; to bsc and back to bed; limited by low bp and tachycardia; did have improved balance and step clearence today; still needs cues for sequence and assist with RW   Stairs             Wheelchair Mobility    Modified Rankin (Stroke Patients Only)       Balance Overall balance assessment: Needs assistance Sitting-balance support: Bilateral upper extremity supported;Feet unsupported Sitting balance-Leahy Scale: Fair     Standing balance support: Bilateral upper extremity supported;During functional activity Standing balance-Leahy Scale: Poor Standing balance comment: required CGA for balance when still, decreased posterior lean today                            Cognition Arousal/Alertness: Awake/alert Behavior During Therapy: WFL for tasks assessed/performed                                   General Comments: Needs cues to focus on task at hand for safety; Pt with forgetfullness at times      Exercises Total Joint Exercises Ankle Circles/Pumps: AROM;Both;Supine;20 reps Quad Sets: AROM;Both;Supine;20 reps Gluteal Sets: AROM;Both;Supine;20 reps Heel Slides: AAROM;Right;Supine;20 reps Hip ABduction/ADduction: AAROM;Right;Supine;20 reps Long Arc Quad: AROM;Both;Seated;20 reps    General Comments        Pertinent Vitals/Pain Pain Assessment: 0-10 Pain Score: 5 (0 at rest; 5 with activity) Pain Location: R hip Pain Descriptors /  Indicators: Discomfort;Sore Pain Intervention(s): Repositioned;Ice applied   Orthostatic BPs     Sitting 109/71 and HR 115  After transfer 80/55 and HR 119  After sitting 5 misn 108/72 and HR 123  After return supine HR 109      Home Living                      Prior Function            PT Goals (current goals can now be found in the care plan section) Progress towards PT goals: Progressing toward goals     Frequency    7X/week      PT Plan Current plan remains appropriate    Co-evaluation              AM-PAC PT "6 Clicks" Mobility   Outcome Measure  Help needed turning from your back to your side while in a flat bed without using bedrails?: A Lot Help needed moving from lying on your back to sitting on the side of a flat bed without using bedrails?: A Lot Help needed moving to and from a bed to a chair (including a wheelchair)?: A Little Help needed standing up from a chair using your arms (e.g., wheelchair or bedside chair)?: A Little Help needed to walk in hospital room?: A Lot Help needed climbing 3-5 steps with a railing? : Total 6 Click Score: 13    End of Session Equipment Utilized During Treatment: Gait belt Activity Tolerance: Other (comment)(limited by BP and tachycardia) Patient left: with call bell/phone within reach;in bed;with bed alarm set;with family/visitor present Nurse Communication: Mobility status PT Visit Diagnosis: Unsteadiness on feet (R26.81);Other abnormalities of gait and mobility (R26.89);Muscle weakness (generalized) (M62.81);History of falling (Z91.81)     Time: [id number] PT Time Calculation (min) (ACUTE ONLY): 36 min  Charges:  $Gait Training: 8-22 mins $Therapeutic Exercise: 8-22 mins                     Glenda [name], PT Acute Rehab Services Pager [phone] [facility] [phone] [facility] [phone]    Glenda [name] 02/08/2019, 11:25 AM

## 2019-02-08 NOTE — Progress Notes (Signed)
PATIENT ID: Glenda [name]  MRN: [id number]  DOB/AGE:  [birth year] / 85 y.o.  3 Days Post-Op Procedure(s) (LRB): RIGH TOTAL HIP ARTHROPLASTY ANTERIOR APPROACH AND REMOVAL OF SCREWS (Right)    PROGRESS NOTE Subjective: Patient is alert, oriented, no Nausea, no Vomiting, yes passing gas, . Taking PO well. Denies SOB, Chest or Calf Pain. Using Incentive Spirometer, PAS in place. Ambulate WBAT with pt walking 3 ft with therapy.  She was hypotensive and anemic, so she received 2 units of blood yesterday. Patient reports pain as  Mild to moderate .    Objective: Vital signs in last 24 hours: Vitals:   02/07/19 2128 02/08/19 0023 02/08/19 0620 02/08/19 0750  BP: 102/62 101/63 94/69 100/72  Pulse: (!) 111 (!) 113 (!) 115 (!) 116  Resp: 14 14 14 15   Temp: 98.5 F (36.9 C) 98 F (36.7 C) 98.3 F (36.8 C) 97.8 F (36.6 C)  TempSrc: Oral Oral Oral Oral  SpO2: 97% 97% 95% 97%  Weight:      Height:          Intake/Output from previous day: I/O last 3 completed shifts: In: 3468.3 [P.O.:1160; I.V.:1151.4; Blood:1156.8] Out: 805 [Urine:805]   Intake/Output this shift: No intake/output data recorded.   LABORATORY DATA: Recent Labs    02/06/19 0157 02/07/19 0202 02/08/19 0254  WBC 9.3 10.6* 7.5  HGB 9.6* 8.1* 10.6*  HCT 30.2* 25.9* 32.5*  PLT 164 126* 143*  NA 133* 132*  --   K 4.4 4.8  --   CL 102 105  --   CO2 22 21*  --   BUN 20 30*  --   CREATININE 0.64 1.01*  --   GLUCOSE 215* 165*  --   CALCIUM 8.0* 7.7*  --     Examination: Neurologically intact Neurovascular intact Sensation intact distally Intact pulses distally Dorsiflexion/Plantar flexion intact Incision: dressing C/D/I and no drainage No cellulitis present Compartment soft} XR AP&Lat of hip shows well placed\fixed THA  Assessment:   3 Days Post-Op Procedure(s) (LRB): RIGH TOTAL HIP ARTHROPLASTY ANTERIOR APPROACH AND REMOVAL OF SCREWS (Right) ADDITIONAL DIAGNOSIS:  Expected Acute Blood Loss Anemia,  History of palpitations,Atrial fibrillation, aortic stenosis, long-term use of Eliquis, chronic pain syndrome, chronic diastolic heart failure Anticipated LOS equal to or greater than 2 midnights due to - Age 83 and older with one or more of the following:  - Obesity  - Expected need for hospital services (PT, OT, Nursing) required for safe  discharge  - Anticipated need for postoperative skilled nursing care or inpatient rehab    OR   - Unanticipated findings during/Post Surgery: Slow post-op progression: GI, pain control, mobility    Plan: PT/OT WBAT, THA  DVT Prophylaxis: SCDx72 hrs, Apixaban 2.5 mg BID  DISCHARGE PLAN: Home, when pt passes therapy goals  DISCHARGE NEEDS: HHPT, Walker and 3-in-1 comode seat

## 2019-02-08 NOTE — Care Management Important Message (Signed)
Important Message  Patient Details IM Letter given to [name] SW to present to the Patient Name: Glenda [name] MRN: [id number] Date of Birth: 1924-12-27   Medicare Important Message Given:  Yes     [name] 02/08/2019, 10:58 AM

## 2019-02-08 NOTE — Progress Notes (Signed)
Physical Therapy Treatment Patient Details Name: Glenda [name] MRN: [id number] DOB: [birth year] Today's Date: 02/08/2019    History of Present Illness Pt is 85 yo female s/p direct anterior R THA.  Pt with prior R femur fracture ealier this year.  PMH includes HTN, CHF, anxiety, see H and P for further history.  Post-op pt has had difficulty with hypotension. She received 2 units of PRBC on 02/07/19    PT Comments    Pt with improved tolerance for activity.  Still with tachycardia but no orthostatic BP during this session.  She was able to increase gait to 30' and demonstrated improved balance with less posteriorly lean.  Does continue to need min-mod A for transfers and gait for safety, but plan is to return to ALF with assist to get up at all times.  Pt is somewhat apprehensive about discharging today, if she is discharged today.  Will continue to benefit from PT while hospitalized, but does have support system at d/c.   Notified RN of pt's progress and concern.   Follow Up Recommendations  Home health PT;Other (comment);Supervision/Assistance - 24 hour also recommend w/c transport in ALF van back to facility - car transfer would be difficult for pt     Equipment Recommendations  Rolling walker with 5" wheels    Recommendations for Other Services       Precautions / Restrictions Precautions Precautions: Fall Precaution Comments: Monitor BP and HR Restrictions RLE Weight Bearing: Weight bearing as tolerated    Mobility  Bed Mobility Overal bed mobility: Needs Assistance       Supine to sit: Mod assist Sit to supine: Mod assist   General bed mobility comments: cues for technique; mod A for legs; limited by pain  Transfers Overall transfer level: Needs assistance Equipment used: Rolling walker (2 wheeled) Transfers: Sit to/from Stand Sit to Stand: Min guard         General transfer comment: performed sit to stand x 7 with cues for safe hand placement 25% of time;  toielting ADLs with min A for balance  Ambulation/Gait Ambulation/Gait assistance: Min guard Gait Distance (Feet): 30 Feet Assistive device: Rolling walker (2 wheeled) Gait Pattern/deviations: Step-to pattern;Antalgic;Decreased stride length Gait velocity: decreased   General Gait Details: 25' then 5'x3 for transfers in room; rest breaks; cues for RW and posture; chair follow for safety; improved ability for advancing bil LE (less shuffling)   Stairs             Wheelchair Mobility    Modified Rankin (Stroke Patients Only)       Balance Overall balance assessment: Needs assistance Sitting-balance support: Bilateral upper extremity supported;Feet supported Sitting balance-Leahy Scale: Good     Standing balance support: Bilateral upper extremity supported;During functional activity Standing balance-Leahy Scale: Fair Standing balance comment: Pt stood at sink to brush her teeth for 3 mins with UE support no LOB;  did have 1-2 LOB initially posteriorly with sit to stand requiring min A to recover                            Cognition Arousal/Alertness: Awake/alert Behavior During Therapy: WFL for tasks assessed/performed Overall Cognitive Status: No family/caregiver present to determine baseline cognitive functioning                                 General Comments: Needs cues to focus on  task at hand for safety; Pt with forgetfullness at times      Exercises Total Joint Exercises Ankle Circles/Pumps: AROM;Both;Supine;10 reps Quad Sets: AROM;Both;Supine;10 reps Gluteal Sets: AROM;Both;Supine;10 reps Short Arc Quad: AROM;Right;10 reps;Supine Heel Slides: AAROM;Right;Supine;10 reps Hip ABduction/ADduction: AAROM;Right;Supine;10 reps Long Arc Quad: AROM;Both;Seated;10 reps    General Comments General comments (skin integrity, edema, etc.): BP 105/72 at EOB and up to 117/78 with ambulation.  HR 109-120 bpm throughout treatemtns.       Pertinent Vitals/Pain Pain Assessment: 0-10 Pain Score: 2  Pain Location: R hip Pain Descriptors / Indicators: Discomfort;Sore Pain Intervention(s): Repositioned;Ice applied;Limited activity within patient's tolerance    Home Living                      Prior Function            PT Goals (current goals can now be found in the care plan section) Progress towards PT goals: Progressing toward goals    Frequency    7X/week      PT Plan Current plan remains appropriate    Co-evaluation              AM-PAC PT "6 Clicks" Mobility   Outcome Measure  Help needed turning from your back to your side while in a flat bed without using bedrails?: A Lot Help needed moving from lying on your back to sitting on the side of a flat bed without using bedrails?: A Lot Help needed moving to and from a bed to a chair (including a wheelchair)?: A Little Help needed standing up from a chair using your arms (e.g., wheelchair or bedside chair)?: A Little Help needed to walk in hospital room?: A Little Help needed climbing 3-5 steps with a railing? : A Lot 6 Click Score: 15    End of Session Equipment Utilized During Treatment: Gait belt Activity Tolerance: Patient tolerated treatment well Patient left: with call bell/phone within reach;in bed;with bed alarm set Nurse Communication: Mobility status PT Visit Diagnosis: Unsteadiness on feet (R26.81);Other abnormalities of gait and mobility (R26.89);Muscle weakness (generalized) (M62.81);History of falling (Z91.81)     Time: [id number] PT Time Calculation (min) (ACUTE ONLY): 43 min  Charges:  $Gait Training: 8-22 mins $Therapeutic Exercise: 8-22 mins $Therapeutic Activity: 8-22 mins                     [name], PT Acute Rehab Services Pager [phone] [facility] [phone]    [name] 02/08/2019, 2:05 PM

## 2019-02-09 ENCOUNTER — Encounter (HOSPITAL_COMMUNITY): Payer: Self-pay | Admitting: Cardiology

## 2019-02-09 ENCOUNTER — Inpatient Hospital Stay (HOSPITAL_COMMUNITY): Payer: Medicare Other

## 2019-02-09 DIAGNOSIS — Z96641 Presence of right artificial hip joint: Secondary | ICD-10-CM

## 2019-02-09 DIAGNOSIS — I483 Typical atrial flutter: Secondary | ICD-10-CM

## 2019-02-09 DIAGNOSIS — I340 Nonrheumatic mitral (valve) insufficiency: Secondary | ICD-10-CM

## 2019-02-09 LAB — SARS CORONAVIRUS 2 (TAT 6-24 HRS): SARS Coronavirus 2: NEGATIVE

## 2019-02-09 LAB — BASIC METABOLIC PANEL
Anion gap: 6 (ref 5–15)
BUN: 39 mg/dL — ABNORMAL HIGH (ref 8–23)
CO2: 26 mmol/L (ref 22–32)
Calcium: 8.3 mg/dL — ABNORMAL LOW (ref 8.9–10.3)
Chloride: 106 mmol/L (ref 98–111)
Creatinine, Ser: 0.99 mg/dL (ref 0.44–1.00)
GFR calc Af Amer: 57 mL/min — ABNORMAL LOW (ref >60)
GFR calc non Af Amer: 49 mL/min — ABNORMAL LOW (ref >60)
Glucose, Bld: 111 mg/dL — ABNORMAL HIGH (ref 70–99)
Potassium: 4.6 mmol/L (ref 3.5–5.1)
Sodium: 138 mmol/L (ref 135–145)

## 2019-02-09 LAB — TSH: TSH: 1.203 u[IU]/mL (ref 0.350–4.500)

## 2019-02-09 LAB — ECHOCARDIOGRAM COMPLETE
Height: 61 in
Weight: 1869.5 oz

## 2019-02-09 LAB — MAGNESIUM: Magnesium: 2 mg/dL (ref 1.7–2.4)

## 2019-02-09 MED ORDER — METOPROLOL TARTRATE 25 MG PO TABS
25.0000 mg | ORAL_TABLET | Freq: Two times a day (BID) | ORAL | Status: DC
  Administered 2019-02-09 – 2019-02-10 (×3): 25 mg via ORAL
  Filled 2019-02-09 (×3): qty 1

## 2019-02-09 NOTE — Consult Note (Addendum)
Cardiology Consultation:   Patient ID: Glenda [name] MRN: [id number]; DOB: December 24, 1924  Admit date: 02/05/2019 Date of Consult: 02/09/2019  Primary Care Provider: Marin Olp, MD Primary Cardiologist: Kirk Ruths, MD  Primary Electrophysiologist:  None    Patient Profile:   Glenda [name] is a 85 y.o. female with a hx of HTN and atrial flutter, neg ETT 2012   pt has been DNR,  who is being seen today for the evaluation of a flutter with RVR, psot op at the request of Dr. Mayer Camel.  History of Present Illness:   Glenda [name] with above hx and  DCCV was TEE guided 12/2015 and then recurrent a flutter 07/2016 with repeat DCCV.  She was seen by EP for possible ablation of a fib but pt not inclined to pursue.  On Monitor 05/2017 she had PACs and PVCs but no atrial flutter.   Hx of mild AS, hx bradycardia on BB.  Echo 12/2015 with EF 55-60% Mild AR, mild MR, LA was mildly dilated   Pt was admitted 02/05/19 for total hip due to increased functional disability with trauma, arthritis and femoral neck non-union. She underwent ant R total hip arthoplasty 02/05/19.    Her last dose of eliquis was 02/01/19 at 0800.   Post op developed hypotension requiring neo dripand acute blood loss anemia with drop in hgb from 13 to 8.9.  Given fluids and was transfused total of 3 units PRBC and hgb to 9.6.   Home meds were held.  From tele strips she was in SR.    She improved and vasopressors weaned off.  She was placed back on home meds.  CCM signed off on 02/07/19.    Early AM today HR up to 122, EKG done and atrial flutter with RVR .   HGb up to 10.6, Echo pending,  Pt is on no rate lowering meds, in past she was on BB but developed bradycardia so discontinued.    EKG:  The EKG was personally reviewed and demonstrates:  Atrial flutter at 123 ST depression due to flutter and HR.   When compared to 09/2018 EKG then SR with non specific ST changes and LVH Telemetry:  Telemetry was personally reviewed and  demonstrates: not on tele  Echo pending  Today Na 138, K+ 4.6, BUN 39, Cr 0.99, Mg 2.0, GFR 49 9 prior Cr 0.64   Troponin hs <2 TSH 1.203 Last Hgb 10.6 WBC 7.5 plts 143 up from 126 on the 18th and normal on admit.   Currently HR 120 and BP 117-125/71-83  Afebrile and resp 16.   cozzar on hold and amlodipine,  back on eliquis. No chest pain and no SOB.   Heart Pathway Score:     Past Medical History:  Diagnosis Date  . Aortic stenosis    Echo 02/2011 showing mild AS with normal LV systolic function  . Arthritis    "lower back" (10/09/2015)  . Atrial flutter with rapid ventricular response ([place]) 08/19/2016   s/p successful DCCV on 08/20/16, continue eliquis  . Chronic bronchitis ([place])    "off and on; several years" (10/09/2015)  . Chronic diastolic CHF (congestive heart failure) ([place])    a. 12/2015 Echo: EF 55-60%, mild AI/MR, mildly dil LA.  Marland Kitchen Chronic lower back pain   . Complication of anesthesia    "they had trouble waking me up after colon resection"  . Confusion    Originally listed as TIA-pt denies this hx on 10/09/2015 "it was the  Lorrin Mais I was taking; they had thought I was having a st originally roke"  . DDD (degenerative disc disease), lumbar    severe facet dz and adv DDD MRI L spine 2009  . Diverticulosis   . Dysrhythmia    A-Fib  . Femur fracture, right ([place]) 10/18/2018  . GERD (gastroesophageal reflux disease)   . Hiatal hernia    hx  . Hypercholesterolemia   . Hypertension   . Insomnia   . OA (osteoarthritis) of knee   . Paroxysmal atrial flutter ([place])    a. 09/2015 s/p TEE/DCCV;  b. CHA2DS2VASc = 5-->Eliquis.  . Peripheral edema   . PVCs (premature ventricular contractions)   . Sacral fracture ([place]) 11/06/2012  . Vertigo 11/19/2012    Past Surgical History:  Procedure Laterality Date  . BREAST BIOPSY Left   . CARDIAC CATHETERIZATION  02/17/2001   MILD REGURGITATION. EF 60%  . CARDIOVERSION N/A 10/08/2015   Procedure: CARDIOVERSION;  Surgeon: Sanda Klein,  MD;  Location: [facility];  Service: Cardiovascular;  Laterality: N/A;  . CARDIOVERSION N/A 08/20/2016   Procedure: Cardioversion;  Surgeon: Evans Lance, MD;  Location: [facility];  Service: Cardiovascular;  Laterality: N/A;  . CATARACT EXTRACTION W/ INTRAOCULAR LENS  IMPLANT, BILATERAL Bilateral   . CESAREAN SECTION  1954  . COLECTOMY     related to "blockage"  . COLONOSCOPY    . EXCISIONAL HEMORRHOIDECTOMY    . HIP PINNING,CANNULATED Right 10/19/2018   Procedure: Right percutaneous hip pinning;  Surgeon: Erle Crocker, MD;  Location: [facility];  Service: Orthopedics;  Laterality: Right;  . INGUINAL HERNIA REPAIR Bilateral   . KYPHOPLASTY Right 09/11/2012   Procedure: Right Acrylic Sacroplasty;  Surgeon: Kristeen Miss, MD;  Location: [facility];  Service: Neurosurgery;  Laterality: Right;  Right  Acrylic Sacroplasty  . TEE WITHOUT CARDIOVERSION N/A 10/08/2015   Procedure: TRANSESOPHAGEAL ECHOCARDIOGRAM (TEE);  Surgeon: Sanda Klein, MD;  Location: [facility];  Service: Cardiovascular;  Laterality: N/A;  . TOTAL HIP ARTHROPLASTY Right 02/05/2019   Procedure: Guy TOTAL HIP ARTHROPLASTY ANTERIOR APPROACH AND REMOVAL OF SCREWS;  Surgeon: Frederik Pear, MD;  Location: [facility];  Service: Orthopedics;  Laterality: Right;  . TOTAL KNEE ARTHROPLASTY Right   . TOTAL MASTECTOMY Left 07/22/2017   Procedure: TOTAL MASTECTOMY;  Surgeon: Excell Seltzer, MD;  Location: [facility];  Service: General;  Laterality: Left;  . UMBILICAL HERNIA REPAIR       Home Medications:  Prior to Admission medications   Medication Sig Start Date End Date Taking? Authorizing Provider  acetaminophen (TYLENOL) 500 MG tablet Take 1,000 mg by mouth every 6 (six) hours as needed for moderate pain.   Yes [provider]  amLODipine (NORVASC) 5 MG tablet Take 1 tablet (5 mg total) by mouth daily. 03/31/18  Yes Almyra Deforest, PA  anastrozole (ARIMIDEX) 1 MG tablet Take 1 tablet (1 mg total) by mouth daily.  10/10/18  Yes Nicholas Lose, MD  apixaban (ELIQUIS) 2.5 MG TABS tablet Take 1 tablet (2.5 mg total) by mouth 2 (two) times daily. 07/24/17  Yes Greer Pickerel, MD  Cholecalciferol 1000 units tablet Take 1,000 Units by mouth daily.   Yes [provider]  Cranberry 425 MG CAPS Take 425 mg by mouth 2 (two) times daily.   Yes [provider]  docusate sodium (COLACE) 100 MG capsule Take 100 mg by mouth 2 (two) times daily.   Yes [provider]  furosemide (LASIX) 20 MG tablet Take 1.5 tablets (  30 mg total) by mouth daily. 08/26/16  Yes Baldwin Jamaica, PA-C  HYDROcodone-acetaminophen (NORCO/VICODIN) 5-325 MG tablet Take 1 tablet by mouth every 6 (six) hours as needed for moderate pain or severe pain (may fill 01/15/2019). 12/05/18  Yes Marin Olp, MD  losartan (COZAAR) 100 MG tablet Take 1 tablet (100 mg total) by mouth daily. 07/12/17  Yes Marin Olp, MD  Multiple Vitamins-Minerals (CEROVITE ADVANCED FORMULA PO) Take 1 tablet by mouth daily. (0800)   Yes [provider]  omeprazole (PRILOSEC) 40 MG capsule Take 40 mg by mouth daily. 08/30/17  Yes [provider]  ondansetron (ZOFRAN) 4 MG tablet Take 4 mg by mouth every 6 (six) hours as needed for nausea.   Yes [provider]  polyethylene glycol (MIRALAX / GLYCOLAX) packet Take 17 g by mouth See admin instructions. Mix 17 grams in 8 ounces of juice/water and drink daily, Monday through Saturday   Yes [provider]  sertraline (ZOLOFT) 25 MG tablet TAKE (1) TABLET BY MOUTH ONCE DAILY. Patient taking differently: Take 25 mg by mouth daily. TAKE (1) TABLET BY MOUTH ONCE DAILY. 09/15/18  Yes Marin Olp, MD  temazepam (RESTORIL) 15 MG capsule TAKE (1) CAPSULE BY MOUTH AT BEDTIME. SHOULD SEPARATE FROM HYDROCODONE BY AT LEAST 6 HOURS. Patient taking differently: Take 15 mg by mouth at bedtime. Should separate from hydrocodone by at least 6 hours 01/11/19  Yes Marin Olp, MD   benzonatate (TESSALON) 100 MG capsule Take 100 mg by mouth every 8 (eight) hours as needed for cough.    [provider]  HYDROcodone-acetaminophen (NORCO/VICODIN) 5-325 MG tablet Take 1 tablet by mouth every 6 (six) hours as needed for moderate pain (may fill 12/17/2018). Patient not taking: Reported on 01/23/2019 12/05/18   Marin Olp, MD  oxyCODONE-acetaminophen (PERCOCET/ROXICET) 5-325 MG tablet Take 1 tablet by mouth every 4 (four) hours as needed for severe pain. 02/05/19   Leighton Parody, PA-C  tiZANidine (ZANAFLEX) 2 MG tablet Take 1 tablet (2 mg total) by mouth every 6 (six) hours as needed. 02/05/19   Leighton Parody, PA-C    Inpatient Medications: Scheduled Meds: . sodium chloride   Intravenous Once  . sodium chloride   Intravenous Once  . amLODipine  5 mg Oral Daily  . anastrozole  1 mg Oral Daily  . apixaban  2.5 mg Oral Q12H  . celecoxib  200 mg Oral BID  . Chlorhexidine Gluconate Cloth  6 each Topical Q0600  . cholecalciferol  1,000 Units Oral Daily  . docusate sodium  100 mg Oral BID  . furosemide  30 mg Oral Daily  . gabapentin  300 mg Oral TID  . losartan  100 mg Oral Daily  . mouth rinse  15 mL Mouth Rinse BID  . pantoprazole  40 mg Oral Daily  . polyethylene glycol  17 g Oral Once per day on Mon Tue Wed Thu Fri Sat  . sertraline  25 mg Oral Daily  . temazepam  15 mg Oral QHS   Continuous Infusions: . sodium chloride Stopped (02/06/19 0023)  . dextrose 5 % and 0.45 % NaCl with KCl 20 mEq/L 30 mL/hr at 02/07/19 0555  . methocarbamol (ROBAXIN) IV 500 mg (02/05/19 1734)   PRN Meds: acetaminophen, alum & mag hydroxide-simeth, benzonatate, bisacodyl, diphenhydrAMINE, HYDROmorphone (DILAUDID) injection, menthol-cetylpyridinium **OR** phenol, methocarbamol **OR** methocarbamol (ROBAXIN) IV, metoCLOPramide **OR** metoCLOPramide (REGLAN) injection, ondansetron **OR** ondansetron (ZOFRAN) IV, oxyCODONE, polyethylene glycol, sodium phosphate  Allergies:  Allergies  Allergen Reactions  . Ace Inhibitors Other (See Comments)    Cough  . Halcion [Triazolam]     UNSPECIFIED REACTION   . Pentazocine Lactate     UNSPECIFIED REACTION   . Sulfa Drugs Cross Reactors     UNSPECIFIED REACTION   . Trazodone And Nefazodone Other (See Comments)    Per MAR  . Amitriptyline Other (See Comments)    Hyperactivity  . Avelox [Moxifloxacin Hcl In Nacl] Other (See Comments)    dizziness  . Penicillins Rash    Social History:   Social History   Socioeconomic History  . Marital status: Widowed    Spouse name: Not on file  . Number of children: Not on file  . Years of education: Not on file  . Highest education level: Not on file  Occupational History  . Not on file  Social Needs  . Financial resource strain: Not on file  . Food insecurity    Worry: Not on file    Inability: Not on file  . Transportation needs    Medical: Not on file    Non-medical: Not on file  Tobacco Use  . Smoking status: Never Smoker  . Smokeless tobacco: Never Used  Substance and Sexual Activity  . Alcohol use: No  . Drug use: No  . Sexual activity: Never  Lifestyle  . Physical activity    Days per week: Not on file    Minutes per session: Not on file  . Stress: Not on file  Relationships  . Social Herbalist on phone: Not on file    Gets together: Not on file    Attends religious service: Not on file    Active member of club or organization: Not on file    Attends meetings of clubs or organizations: Not on file    Relationship status: Not on file  . Intimate partner violence    Fear of current or ex partner: Not on file    Emotionally abused: Not on file    Physically abused: Not on file    Forced sexual activity: Not on file  Other Topics Concern  . 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 Heart disease Mother   . Hypertension Father   . Other Sister        7 in 2019  . Other Sister        82 in 2019  . Heart disease Sister   . Heart attack Neg Hx   . Stroke Neg Hx      ROS:  Please see the history of present illness.  General:no colds or fevers, no weight changes Skin:no rashes or ulcers HEENT:no blurred vision, no congestion CV:see HPI PUL:see HPI GI:no diarrhea constipation or melena, no indigestion GU:no hematuria, no dysuria MS:rt hip pain leading to surgery, no claudication Neuro:no syncope, no lightheadedness Endo:no diabetes, no thyroid disease  All other ROS reviewed and negative.     Physical  Exam/Data:   Vitals:   02/08/19 2154 02/09/19 0215 02/09/19 0637 02/09/19 0834  BP: 104/68  117/71 125/83  Pulse: (!) 119 (!) 112 (!) 122 (!) 120  Resp: 16  18 16   Temp: 98.5 F (36.9 C)  98.4 F (36.9 C) 98.1 F (36.7 C)  TempSrc: Oral  Oral   SpO2: 97%  96% 98%  Weight:      Height:        Intake/Output Summary (Last 24 hours) at 02/09/2019 1216 Last data filed at 02/09/2019 1036 Gross per 24 hour  Intake 640 ml  Output 0 ml  Net 640 ml   Last 3 Weights 02/06/2019 02/05/2019 01/30/2019  Weight (lbs) 116 lb 13.5 oz 117 lb 1 oz 116 lb  Weight (kg) 53 kg 53.1 kg 52.617 kg     Body mass index is 22.08 kg/m.  General:  Frail female, in no acute distress, sitting in chair  HEENT: normal Lymph: no adenopathy Neck: no JVD Endocrine:  No thryomegaly Vascular: No carotid bruits; pedal pulses 2+ bilaterally   Cardiac:  Rapid HR mostly regular no murmur gallup rub or click Lungs:  clear to auscultation bilaterally, no wheezing, rhonchi or rales  Abd: soft, nontender, no hepatomegaly  Ext: mild  edema Musculoskeletal:  No deformities, BUE and BLE strength normal and equal Skin: warm and dry  Neuro:  Alert and oriented X 3 MAE follows commands, no focal abnormalities noted Psych:  Normal affect   Relevant CV Studies: Echo 12/2015  Study Conclusions  - Left ventricle: The cavity size was normal. There was mild   concentric hypertrophy. Systolic function was normal. The   estimated ejection fraction was in the range of 55% to 60%. - Aortic valve: There was mild regurgitation. - Mitral valve: There was mild regurgitation. - Left atrium: The atrium was mildly dilated. - Atrial septum: No defect or patent foramen ovale was identified.   Laboratory Data:  High Sensitivity Troponin:   Recent Labs  Lab 02/06/19 1241  TROPONINIHS <2     Chemistry Recent Labs  Lab 02/06/19 0157 02/07/19 0202 02/09/19 1108  NA 133* 132* 138  K 4.4 4.8 4.6  CL 102 105 106  CO2 22 21* 26  GLUCOSE 215* 165* 111*  BUN 20 30* 39*  CREATININE 0.64 1.01* 0.99  CALCIUM 8.0* 7.7* 8.3*  GFRNONAA >60 48* 49*  GFRAA >60 56* 57*  ANIONGAP 9 6 6     No results for input(s): PROT, ALBUMIN, AST, ALT, ALKPHOS, BILITOT in the last 168 hours. Hematology Recent Labs  Lab 02/06/19 0157 02/07/19 0202 02/08/19 0254  WBC 9.3 10.6* 7.5  RBC 3.33* 2.79* 3.60*  HGB 9.6* 8.1* 10.6*  HCT 30.2* 25.9* 32.5*  MCV 90.7 92.8 90.3  MCH 28.8 29.0 29.4  MCHC 31.8 31.3 32.6  RDW 14.4 14.6 14.6  PLT 164 126* 143*   BNPNo results for input(s): BNP, PROBNP in the last 168 hours.  DDimer No results for input(s): DDIMER in the last 168 hours.   Radiology/Studies:  Dg C-arm 1-60 Min-no Report  Result Date: 02/05/2019 Fluoroscopy was utilized by the requesting physician.  No radiographic interpretation.   Dg Hip Operative Unilat W Or W/o Pelvis Right  Result Date: 02/05/2019 CLINICAL DATA:  Removal of hip pins EXAM: OPERATIVE right HIP (WITH PELVIS IF PERFORMED) 2 VIEWS TECHNIQUE: Fluoroscopic spot  image(s) were submitted for interpretation post-operatively. COMPARISON:  10/19/2018 FINDINGS: Two low resolution intraoperative spot views of the right hip. Total fluoroscopy time was  40 seconds. Removal of threaded screws from the right femur. There is a right hip replacement with normal alignment. IMPRESSION: Intraoperative fluoroscopic assistance provided during right hip surgery Electronically Signed   By: Donavan Foil M.D.   On: 02/05/2019 17:07    Assessment and Plan:   1. Atrial flutter with RVR, post op after hypotension and acute blood loss anemia, though recovery from both prior to a flutter.  She is on Eliquis with CHA2DS2VASc of 4-5.  In past with BB had brady,  Possible add dilt po for now to slow.  may need follow up DCCV.  May need TEE off eliquis from the 12th until resumed on 02/06/19.   Transfer to tele.keep over weekend with plan to add BB now and if not converted with meds then TEE DCCV on Monday.   Dr. Marlou Porch to see.  I stopped amlodipine and cozaar.  She continues on lsix 30 mg daily.  2. HTN with hypotension post op on neo but now improved but not yet ready for all of home meds  3. Acute blood loss anemia with surgery and transfused 3 unit PRBCs   4. AKI improved  5. Post op Rt total hip POD #4.        For questions or updates, please contact [facility] Please consult [email or web address] for contact info under     Signed, Cecilie Kicks, NP  02/09/2019 12:16 PM   Personally seen and examined. Agree with above.   85 year old with postoperative atrial flutter, typical appearing with prior history of atrial flutter with TEE guided cardioversion in October 2017.  She had postop anemia status post transfusion.  Denies any chest pain fevers chills nausea vomiting syncope.  She states that she does not feel her heart pounding like she used to prior to her last cardioversion.  Appears fairly comfortable in bed.  Heart rate continues to maintain at 122.  Telemetry and EKG  personally reviewed showing atrial flutter.  Prior echocardiogram 2017 shows EF of 60%.  GEN: Thin, looks younger than stated age 62: normal  Neck: no JVD, carotid bruits, or masses Cardiac: Tachycardic regular; mild systolic murmur, no rubs, or gallops,no edema  Respiratory:  clear to auscultation bilaterally, normal work of breathing GI: soft, nontender, nondistended, + BS MS: no deformity or atrophy  Skin: warm and dry, no rash Neuro:  Alert and Oriented x 3, Strength and sensation are intact Psych: euthymic mood, full affect  Lab work reviewed.  Assessment and plan:  Typical atrial flutter paroxysmal -Likely triggered by catecholamine surge following surgery with concomitant anemia. -We will go ahead and give her metoprolol as above to see if we can help break this catecholamine drive and coax her to auto convert. -If she continues with atrial flutter, which can be very difficult to rate control, we would propose moving forward with TEE guided cardioversion on Monday.  Hypertension/anemia/AKI/postop right total hip -Improving.  Continuing to monitor.  Candee Furbish, MD

## 2019-02-09 NOTE — Plan of Care (Signed)
Plan of care reviewed and discussed with the patient. 

## 2019-02-09 NOTE — Progress Notes (Signed)
Physical Therapy Treatment Patient Details Name: Glenda [name] MRN: [id number] DOB: 1924/07/28 Today's Date: 02/09/2019    History of Present Illness Pt is 85 yo female s/p direct anterior R THA.  Pt with prior R femur fracture ealier this year.  PMH includes HTN, CHF, anxiety, see H and P for further history.  Post-op pt has had difficulty with hypotension. She received 2 units of PRBC on 02/07/19. Pt continues to have tachycardia - per notes hospitalist now consulted.    PT Comments    Pt was able to participate with exercise and ambulation in the room, but further ambulation limited due to tachycardia. Continues to need cues and assist for exercise and safety with transfers.  Tends to lean posteriorly at times with transfers.  Pt will need assist every time she is up when she returns to ALF for safety.  Cont POC.   Follow Up Recommendations  Home health PT;Other (comment);Supervision/Assistance - 24 hour(increased support at ALF)     Equipment Recommendations  Rolling walker with 5" wheels    Recommendations for Other Services       Precautions / Restrictions Precautions Precautions: Fall Precaution Comments: Monitor BP and HR Restrictions Weight Bearing Restrictions: No RLE Weight Bearing: Weight bearing as tolerated    Mobility  Bed Mobility         Supine to sit: Mod assist Sit to supine: Mod assist   General bed mobility comments: cues for technique; mod A for legs; limited by pain  Transfers   Equipment used: Rolling walker (2 wheeled)   Sit to Stand: Min guard         General transfer comment: performed sit to stand x 4 with cues for safe hand placement 25% of time; toielting ADLs with min A for balance  Ambulation/Gait Ambulation/Gait assistance: Min assist Gait Distance (Feet): 20 Feet Assistive device: Rolling walker (2 wheeled) Gait Pattern/deviations: Step-to pattern;Antalgic;Decreased stride length Gait velocity: decreased   General Gait  Details: 5' to bsc then 20' in room ; rest breaks; cues and assist for RW, cues for posture and sequence; limited distance due to tachycardia   Stairs             Wheelchair Mobility    Modified Rankin (Stroke Patients Only)       Balance   Sitting-balance support: Bilateral upper extremity supported;Feet supported Sitting balance-Leahy Scale: Good     Standing balance support: Bilateral upper extremity supported;During functional activity Standing balance-Leahy Scale: Fair Standing balance comment: posteriorly lean initially with standing but improved with cues and about 30 seconds                            Cognition Arousal/Alertness: Awake/alert Behavior During Therapy: WFL for tasks assessed/performed Overall Cognitive Status: No family/caregiver present to determine baseline cognitive functioning                                 General Comments: Needs cues to focus on task at hand for safety; Pt with forgetfullness at times      Exercises Total Joint Exercises Ankle Circles/Pumps: AROM;Both;Supine;10 reps Quad Sets: AROM;Both;Supine;10 reps Gluteal Sets: AROM;Both;Supine;10 reps Short Arc Quad: AROM;Right;10 reps;Supine Heel Slides: AAROM;Right;Supine;10 reps Hip ABduction/ADduction: AAROM;Right;Supine;10 reps Long Arc Quad: AROM;Both;Seated;10 reps    General Comments General comments (skin integrity, edema, etc.): No syncopal or dizziness symptoms today.  HR was 118-120 bpm  rest and 125 bpm with walking; limited walking due to tachycardia      Pertinent Vitals/Pain Pain Assessment: 0-10 Pain Score: 2  Pain Location: R hip Pain Descriptors / Indicators: Discomfort;Sore Pain Intervention(s): Repositioned;Limited activity within patient's tolerance;Premedicated before session;Ice applied    Home Living                      Prior Function            PT Goals (current goals can now be found in the care plan  section) Progress towards PT goals: Progressing toward goals    Frequency    7X/week      PT Plan Current plan remains appropriate    Co-evaluation              AM-PAC PT "6 Clicks" Mobility   Outcome Measure  Help needed turning from your back to your side while in a flat bed without using bedrails?: A Lot Help needed moving from lying on your back to sitting on the side of a flat bed without using bedrails?: A Lot Help needed moving to and from a bed to a chair (including a wheelchair)?: A Little Help needed standing up from a chair using your arms (e.g., wheelchair or bedside chair)?: A Little Help needed to walk in hospital room?: A Little Help needed climbing 3-5 steps with a railing? : A Lot 6 Click Score: 15    End of Session Equipment Utilized During Treatment: Gait belt Activity Tolerance: Patient tolerated treatment well Patient left: with call bell/phone within reach;in chair;with chair alarm set Nurse Communication: Mobility status PT Visit Diagnosis: Unsteadiness on feet (R26.81);Other abnormalities of gait and mobility (R26.89);Muscle weakness (generalized) (M62.81);History of falling (Z91.81)     Time: [id number] PT Time Calculation (min) (ACUTE ONLY): 32 min  Charges:  $Gait Training: 8-22 mins $Therapeutic Exercise: 8-22 mins                     [name], PT Acute Rehab Services Pager [phone] [facility] [phone] [facility] [phone]    [name] 02/09/2019, 1:21 PM

## 2019-02-09 NOTE — Progress Notes (Signed)
  Echocardiogram 2D Echocardiogram has been performed.  Glenda [name] 02/09/2019, 3:24 PM

## 2019-02-09 NOTE — Consult Note (Addendum)
[facility] Medical Consultation  Glenda [name] [id number] DOB: 1925-02-01 DOA: 02/05/2019 PCP: Marin Olp, MD   Requesting physician: Frederik Pear, MD  Date of consultation: 02/09/2019   Reason for consultation: Tachycardia  Impression/Recommendations Principal Problem:   Osteoarthritis of right hip Active Problems:   Status post total replacement of right hip   Hypotension due to hypovolemia  Tachycardia secondary to paroxysmal atrial flutter.  Patient does have history of paroxysmal atrial flutter.  Patient was seen by cardiology in the past and recommended no nodal blockers due to bradycardia.  Is currently on Eliquis for anticoagulation.  EKG today shows atrial flutter.  No chest pain, shortness of breath.  Patient does have history of DCCV in the past on 08/20/2016 with conversion to normal sinus..  Will get cardiology opinion.  Last 2D echocardiogram noted from 2017 with preserved LV function.  We will repeat 2D echocardiogram.  Check TSH  Acute blood loss anemia improved after transfusion.  Latest hemoglobin of 10.6.  No ongoing bleeding.  Osteoarthritis of the hip history of pinning followed by removal of pin and total hip replacement.  Management as per orthopedics.   Postoperative hypotension initially requiring vasopressors secondary to acute blood loss anemia.  Patient also received 1 unit of packed RBC.  Patient was initially in stepdown unit.  At this time, blood pressure has improved.  Patient is participating with physical therapy.  Mild thrombocytopenia.  Closely monitor.  Acute kidney injury.  Creatinine on 02/07/2019 of 1.1 , creatinine on 11/17 was 0.6.  We will repeat BMP stat.  Might need the IV fluid bolus.  Hold antihypertensives for now.  Hold losartan  TRH team will followup again tomorrow. Please contact me if I can be of assistance in the meanwhile. Thank you for this consultation.  Chief Complaint: Weakness  HPI:  Patient is a 85  years old female with past medical history of paroxysmal atrial flutter, hypertension, mild aortic stenosis, chronic diastolic heart failure, hypertension, hyperlipidemia was admitted to hospital for total hip replacement.  Postoperative course was complicated with acute blood loss anemia and hypotension and required PRBC transfusion and vasopressors.  Subsequently, patient has improved with stable blood pressure.  Medical team was consulted today due to mention of tachycardia.  EKG was performed which showed atrial flutter.  She denies any chest pain, palpitation, dizziness or lightheadedness but has generalized weakness.  Patient denies nausea, vomiting or abdominal pain.  Denies constipation or diarrhea.  Has had a good bowel movement.  Denies urinary urgency, frequency or dysuria.   Review of Systems:  All systems were reviewed and were negative except as in the history of presenting illness  Past Medical History:  Diagnosis Date  . Aortic stenosis    Echo 02/2011 showing mild AS with normal LV systolic function  . Arthritis    "lower back" (10/09/2015)  . Atrial flutter with rapid ventricular response ([place]) 08/19/2016   s/p successful DCCV on 08/20/16, continue eliquis  . Chronic bronchitis ([place])    "off and on; several years" (10/09/2015)  . Chronic diastolic CHF (congestive heart failure) ([place])    a. 12/2015 Echo: EF 55-60%, mild AI/MR, mildly dil LA.  Marland Kitchen Chronic lower back pain   . Complication of anesthesia    "they had trouble waking me up after colon resection"  . Confusion    Originally listed as TIA-pt denies this hx on 10/09/2015 "it was the [place] I was taking; they had thought I was having a st originally  roke"  . DDD (degenerative disc disease), lumbar    severe facet dz and adv DDD MRI L spine 2009  . Diverticulosis   . Dysrhythmia    A-Fib  . Femur fracture, right ([place]) 10/18/2018  . GERD (gastroesophageal reflux disease)   . Hiatal hernia    hx  . Hypercholesterolemia    . Hypertension   . Insomnia   . OA (osteoarthritis) of knee   . Paroxysmal atrial flutter ([place])    a. 09/2015 s/p TEE/DCCV;  b. CHA2DS2VASc = 5-->Eliquis.  . Peripheral edema   . PVCs (premature ventricular contractions)   . Sacral fracture ([place]) 11/06/2012  . Vertigo 11/19/2012   Past Surgical History:  Procedure Laterality Date  . BREAST BIOPSY Left   . CARDIAC CATHETERIZATION  02/17/2001   MILD REGURGITATION. EF 60%  . CARDIOVERSION N/A 10/08/2015   Procedure: CARDIOVERSION;  Surgeon: Sanda Klein, MD;  Location: [facility];  Service: Cardiovascular;  Laterality: N/A;  . CARDIOVERSION N/A 08/20/2016   Procedure: Cardioversion;  Surgeon: Evans Lance, MD;  Location: [facility];  Service: Cardiovascular;  Laterality: N/A;  . CATARACT EXTRACTION W/ INTRAOCULAR LENS  IMPLANT, BILATERAL Bilateral   . CESAREAN SECTION  1954  . COLECTOMY     related to "blockage"  . COLONOSCOPY    . EXCISIONAL HEMORRHOIDECTOMY    . HIP PINNING,CANNULATED Right 10/19/2018   Procedure: Right percutaneous hip pinning;  Surgeon: Erle Crocker, MD;  Location: [facility];  Service: Orthopedics;  Laterality: Right;  . INGUINAL HERNIA REPAIR Bilateral   . KYPHOPLASTY Right 09/11/2012   Procedure: Right Acrylic Sacroplasty;  Surgeon: Kristeen Miss, MD;  Location: [facility];  Service: Neurosurgery;  Laterality: Right;  Right  Acrylic Sacroplasty  . TEE WITHOUT CARDIOVERSION N/A 10/08/2015   Procedure: TRANSESOPHAGEAL ECHOCARDIOGRAM (TEE);  Surgeon: Sanda Klein, MD;  Location: [facility];  Service: Cardiovascular;  Laterality: N/A;  . TOTAL HIP ARTHROPLASTY Right 02/05/2019   Procedure: [place] TOTAL HIP ARTHROPLASTY ANTERIOR APPROACH AND REMOVAL OF SCREWS;  Surgeon: Frederik Pear, MD;  Location: [facility];  Service: Orthopedics;  Laterality: Right;  . TOTAL KNEE ARTHROPLASTY Right   . TOTAL MASTECTOMY Left 07/22/2017   Procedure: TOTAL MASTECTOMY;  Surgeon: Excell Seltzer, MD;  Location: [facility];   Service: General;  Laterality: Left;  . UMBILICAL HERNIA REPAIR     Social History:  reports that she has never smoked. She has never used smokeless tobacco. She reports that she does not drink alcohol or use drugs.  Allergies  Allergen Reactions  . Ace Inhibitors Other (See Comments)    Cough  . Halcion [Triazolam]     UNSPECIFIED REACTION   . Pentazocine Lactate     UNSPECIFIED REACTION   . Sulfa Drugs Cross Reactors     UNSPECIFIED REACTION   . Trazodone And Nefazodone Other (See Comments)    Per MAR  . Amitriptyline Other (See Comments)    Hyperactivity  . Avelox [Moxifloxacin Hcl In Nacl] Other (See Comments)    dizziness  . Penicillins Rash   Family History  Problem Relation Age of Onset  . Heart disease Mother   . Hypertension Father   . Other Sister        47 in 2019  . Other Sister        30 in 2019  . Heart disease Sister   . Heart attack Neg Hx   . Stroke Neg Hx     Prior to Admission medications  Medication Sig Start Date End Date Taking? Authorizing Provider  acetaminophen (TYLENOL) 500 MG tablet Take 1,000 mg by mouth every 6 (six) hours as needed for moderate pain.   Yes [provider]  amLODipine (NORVASC) 5 MG tablet Take 1 tablet (5 mg total) by mouth daily. 03/31/18  Yes Almyra Deforest, PA  anastrozole (ARIMIDEX) 1 MG tablet Take 1 tablet (1 mg total) by mouth daily. 10/10/18  Yes Nicholas Lose, MD  apixaban (ELIQUIS) 2.5 MG TABS tablet Take 1 tablet (2.5 mg total) by mouth 2 (two) times daily. 07/24/17  Yes Greer Pickerel, MD  Cholecalciferol 1000 units tablet Take 1,000 Units by mouth daily.   Yes [provider]  Cranberry 425 MG CAPS Take 425 mg by mouth 2 (two) times daily.   Yes [provider]  docusate sodium (COLACE) 100 MG capsule Take 100 mg by mouth 2 (two) times daily.   Yes [provider]  furosemide (LASIX) 20 MG tablet Take 1.5 tablets (30 mg total) by mouth daily. 08/26/16  Yes Baldwin Jamaica, PA-C   HYDROcodone-acetaminophen (NORCO/VICODIN) 5-325 MG tablet Take 1 tablet by mouth every 6 (six) hours as needed for moderate pain or severe pain (may fill 01/15/2019). 12/05/18  Yes Marin Olp, MD  losartan (COZAAR) 100 MG tablet Take 1 tablet (100 mg total) by mouth daily. 07/12/17  Yes Marin Olp, MD  Multiple Vitamins-Minerals (CEROVITE ADVANCED FORMULA PO) Take 1 tablet by mouth daily. (0800)   Yes [provider]  omeprazole (PRILOSEC) 40 MG capsule Take 40 mg by mouth daily. 08/30/17  Yes [provider]  ondansetron (ZOFRAN) 4 MG tablet Take 4 mg by mouth every 6 (six) hours as needed for nausea.   Yes [provider]  polyethylene glycol (MIRALAX / GLYCOLAX) packet Take 17 g by mouth See admin instructions. Mix 17 grams in 8 ounces of juice/water and drink daily, Monday through Saturday   Yes [provider]  sertraline (ZOLOFT) 25 MG tablet TAKE (1) TABLET BY MOUTH ONCE DAILY. Patient taking differently: Take 25 mg by mouth daily. TAKE (1) TABLET BY MOUTH ONCE DAILY. 09/15/18  Yes Marin Olp, MD  temazepam (RESTORIL) 15 MG capsule TAKE (1) CAPSULE BY MOUTH AT BEDTIME. SHOULD SEPARATE FROM HYDROCODONE BY AT LEAST 6 HOURS. Patient taking differently: Take 15 mg by mouth at bedtime. Should separate from hydrocodone by at least 6 hours 01/11/19  Yes Marin Olp, MD  benzonatate (TESSALON) 100 MG capsule Take 100 mg by mouth every 8 (eight) hours as needed for cough.    [provider]  HYDROcodone-acetaminophen (NORCO/VICODIN) 5-325 MG tablet Take 1 tablet by mouth every 6 (six) hours as needed for moderate pain (may fill 12/17/2018). Patient not taking: Reported on 01/23/2019 12/05/18   Marin Olp, MD  oxyCODONE-acetaminophen (PERCOCET/ROXICET) 5-325 MG tablet Take 1 tablet by mouth every 4 (four) hours as needed for severe pain. 02/05/19   Leighton Parody, PA-C  tiZANidine (ZANAFLEX) 2 MG tablet Take 1 tablet (2 mg total)  by mouth every 6 (six) hours as needed. 02/05/19   Leighton Parody, PA-C    Physical Exam: Blood pressure 125/83, pulse (!) 120, temperature 98.1 F (36.7 C), resp. rate 16, height 5\' 1"  (1.549 m), weight 53 kg, SpO2 98 %. Vitals:   02/09/19 0637 02/09/19 0834  BP: 117/71 125/83  Pulse: (!) 122 (!) 120  Resp: 18 16  Temp: 98.4 F (36.9 C) 98.1 F (36.7 C)  SpO2:  96% 98%   Body mass index is 22.08 kg/m.   Intake/Output Summary (Last 24 hours) at 02/09/2019 1034 Last data filed at 02/09/2019 0600 Gross per 24 hour  Intake 520 ml  Output 0 ml  Net 520 ml    General: Thinly built , not in obvious distress HENT: Normocephalic, pupils equally reacting to light and accommodation.  No scleral pallor or icterus noted. Oral mucosa is moist.  Chest:  Clear breath sounds.  Diminished breath sounds bilaterally. No crackles or wheezes.  CVS: S1 &S2 heard. Tachycardia noted Abdomen: Soft, nontender, nondistended.  Bowel sounds are heard.  Liver is not palpable, no abdominal mass palpated Extremities: No cyanosis, clubbing or edema.  Peripheral pulses are palpable.  Status post rt hip replacement site covered with dressing. Psych: Alert, awake and oriented, normal mood CNS:  No cranial nerve deficits.  Power equal in all extremities.  No sensory deficits noted.  No cerebellar signs.   Skin: Warm and dry.  Right hip surgery  Laboratory Data:  CBC: Recent Labs  Lab 02/05/19 1816 02/06/19 0157 02/07/19 0202 02/08/19 0254  WBC  --  9.3 10.6* 7.5  HGB 8.9* 9.6* 8.1* 10.6*  HCT 28.2* 30.2* 25.9* 32.5*  MCV  --  90.7 92.8 90.3  PLT  --  164 126* 143*    Basic Metabolic Panel: Recent Labs  Lab 02/06/19 0157 02/07/19 0202  NA 133* 132*  K 4.4 4.8  CL 102 105  CO2 22 21*  GLUCOSE 215* 165*  BUN 20 30*  CREATININE 0.64 1.01*  CALCIUM 8.0* 7.7*    Liver Function Tests: No results for input(s): AST, ALT, ALKPHOS, BILITOT, PROT, ALBUMIN in the last 168 hours. No results for  input(s): LIPASE, AMYLASE in the last 168 hours. No results for input(s): AMMONIA in the last 168 hours.  Cardiac Enzymes: No results for input(s): CKTOTAL, CKMB, CKMBINDEX, TROPONINI in the last 168 hours.  BNP: Invalid input(s): POCBNP  CBG: No results for input(s): GLUCAP in the last 168 hours.  Radiological Exams on Admission: No results found.  EKG: Independently reviewed and shows atrial flutter.   Christopherjame Carnell [facility] 02/09/2019

## 2019-02-09 NOTE — Progress Notes (Signed)
   Reviewed echo. No clinical evidence of cardiac tamponade.   OK to continue current course.   Candee Furbish, MD

## 2019-02-09 NOTE — Progress Notes (Signed)
MEWS Yellow d/t HR: 122.  Joanell Rising, PA notified of HR.  Will speak with Dr. Mayer Camel to decide how to proceed.

## 2019-02-09 NOTE — Progress Notes (Signed)
Physical Therapy Treatment Patient Details Name: Glenda [name] MRN: [id number] DOB: 1924-03-23 Today's Date: 02/09/2019    History of Present Illness Pt is 85 yo female s/p direct anterior R THA.  Pt with prior R femur fracture ealier this year.  PMH includes HTN, CHF, anxiety, see H and P for further history.  Post-op pt has had difficulty with hypotension and was placed in ICU for a night; moved to orthopedics as hypotension resolved. She received 2 units of PRBC on 02/07/19. Pt then with tachycardia nd was transferred to telemetry on 02/09/19 and followed by hospitalist and cardiologist with medication changes for A Flutter.     PT Comments    Pt fatigued after transfer to telemetry floor and had just gotten back in bed.  Agreeable to exercises.  Continues to need assistance and cues for correct exercise technique.  Will continue to benefit from acute PT services.     Follow Up Recommendations  Home health PT;Other (comment);Supervision/Assistance - 24 hour     Equipment Recommendations  Rolling walker with 5" wheels    Recommendations for Other Services       Precautions / Restrictions Precautions Precautions: Fall Precaution Comments: Monitor BP and HR Restrictions Weight Bearing Restrictions: No RLE Weight Bearing: Weight bearing as tolerated    Mobility  Bed Mobility                  Transfers                    Ambulation/Gait                 Stairs             Wheelchair Mobility    Modified Rankin (Stroke Patients Only)       Balance                                            Cognition Arousal/Alertness: Awake/alert Behavior During Therapy: WFL for tasks assessed/performed Overall Cognitive Status: No family/caregiver present to determine baseline cognitive functioning                                 General Comments: Forgetfulness at times      Exercises Total Joint  Exercises Ankle Circles/Pumps: AROM;Both;Supine;10 reps Quad Sets: AROM;Both;Supine;10 reps Gluteal Sets: AROM;Both;Supine;10 reps Short Arc Quad: AROM;Right;10 reps;Supine Heel Slides: AAROM;Right;Supine;10 reps Hip ABduction/ADduction: AAROM;Right;Supine;10 reps Long Arc Quad: AROM;Both;Seated;10 reps    General Comments        Pertinent Vitals/Pain Pain Assessment: No/denies pain    Home Living                      Prior Function            PT Goals (current goals can now be found in the care plan section) Acute Rehab PT Goals Patient Stated Goal: return to ALF with increased support PT Goal Formulation: With patient Progress towards PT goals: Progressing toward goals    Frequency    7X/week      PT Plan Current plan remains appropriate    Co-evaluation              AM-PAC PT "6 Clicks" Mobility   Outcome Measure  Help needed turning from your back to  your side while in a flat bed without using bedrails?: A Lot Help needed moving from lying on your back to sitting on the side of a flat bed without using bedrails?: A Lot Help needed moving to and from a bed to a chair (including a wheelchair)?: A Little Help needed standing up from a chair using your arms (e.g., wheelchair or bedside chair)?: A Little Help needed to walk in hospital room?: A Little Help needed climbing 3-5 steps with a railing? : A Lot 6 Click Score: 15    End of Session Equipment Utilized During Treatment: Gait belt Activity Tolerance: Patient tolerated treatment well(Pt fatigued after tx to telemetry and had just gotten back in bed.  Only performed ther ex.) Patient left: with call bell/phone within reach;in bed;with bed alarm set Nurse Communication: Mobility status PT Visit Diagnosis: Unsteadiness on feet (R26.81);Other abnormalities of gait and mobility (R26.89);Muscle weakness (generalized) (M62.81);History of falling (Z91.81)     Time: 1720-1740 PT Time Calculation  (min) (ACUTE ONLY): 20 min  Charges:  $Therapeutic Exercise: 8-22 mins                     [name], PT Acute Rehab Services Pager [phone] [facility] [phone] [facility] [phone]    Glenda [name] 02/09/2019, 5:48 PM

## 2019-02-09 NOTE — Progress Notes (Signed)
PATIENT ID: Glenda [name]  MRN: [id number]  DOB/AGE:  [birth year] / 85 y.o.  4 Days Post-Op Procedure(s) (LRB): RIGH TOTAL HIP ARTHROPLASTY ANTERIOR APPROACH AND REMOVAL OF SCREWS (Right)    PROGRESS NOTE Subjective: Patient is alert, oriented, no Nausea, no Vomiting, yes passing gas, . Taking PO well. Denies SOB, Chest or Calf Pain. Using Incentive Spirometer, PAS in place. Ambulate WBAT with pt walking 30 ft with therapy Patient reports pain as  mild .    Objective: Vital signs in last 24 hours: Vitals:   02/08/19 2154 02/09/19 0215 02/09/19 0637 02/09/19 0834  BP: 104/68  117/71 125/83  Pulse: (!) 119 (!) 112 (!) 122 (!) 120  Resp: 16  18 16   Temp: 98.5 F (36.9 C)  98.4 F (36.9 C) 98.1 F (36.7 C)  TempSrc: Oral  Oral   SpO2: 97%  96% 98%  Weight:      Height:          Intake/Output from previous day: I/O last 3 completed shifts: In: 2016.8 [P.O.:840; I.V.:20; Blood:1156.8] Out: 80 [Urine:80]   Intake/Output this shift: No intake/output data recorded.   LABORATORY DATA: Recent Labs    02/07/19 0202 02/08/19 0254  WBC 10.6* 7.5  HGB 8.1* 10.6*  HCT 25.9* 32.5*  PLT 126* 143*  NA 132*  --   K 4.8  --   CL 105  --   CO2 21*  --   BUN 30*  --   CREATININE 1.01*  --   GLUCOSE 165*  --   CALCIUM 7.7*  --     Examination: Neurologically intact Neurovascular intact Sensation intact distally Intact pulses distally Dorsiflexion/Plantar flexion intact Incision: dressing C/D/I and no drainage No cellulitis present Compartment soft} XR AP&Lat of hip shows well placed\fixed THA  Assessment:   4 Days Post-Op Procedure(s) (LRB): RIGH TOTAL HIP ARTHROPLASTY ANTERIOR APPROACH AND REMOVAL OF SCREWS (Right) ADDITIONAL DIAGNOSIS:  Expected Acute Blood Loss Anemia, History of palpitations,Atrial fibrillation, aortic stenosis, long-term use of Eliquis, chronic pain syndrome, chronic diastolic heart failure Anticipated LOS equal to or greater than 2 midnights due  to - Age 83 and older with one or more of the following:  - Obesity  - Expected need for hospital services (PT, OT, Nursing) required for safe  discharge  - Anticipated need for postoperative skilled nursing care or inpatient rehab  - Active co-morbidities: Cardiac Arrhythmia OR   - Unanticipated findings during/Post Surgery: Slow post-op progression: GI, pain control, mobility      Plan: PT/OT WBAT, THA  DVT Prophylaxis: SCDx72 hrs, Eliquis 2.5 mg BID  DISCHARGE PLAN: Home  DISCHARGE NEEDS: HHPT, Walker and 3-in-1 comode seat   Will consult hospitalist to take a look at pt's tachycardia.

## 2019-02-10 MED ORDER — SODIUM CHLORIDE 0.9 % IV BOLUS
500.0000 mL | Freq: Once | INTRAVENOUS | Status: AC
  Administered 2019-02-10: 500 mL via INTRAVENOUS

## 2019-02-10 MED ORDER — METOPROLOL TARTRATE 50 MG PO TABS
50.0000 mg | ORAL_TABLET | Freq: Two times a day (BID) | ORAL | Status: DC
  Administered 2019-02-10 – 2019-02-11 (×2): 50 mg via ORAL
  Filled 2019-02-10 (×6): qty 1

## 2019-02-10 NOTE — Progress Notes (Signed)
Progress Note  Patient Name: Glenda [name] Date of Encounter: 02/10/2019  Primary Cardiologist: Kirk Ruths, MD   Subjective   Remains unaware of the tachycardia and generally feels well.  Inpatient Medications    Scheduled Meds: . sodium chloride   Intravenous Once  . sodium chloride   Intravenous Once  . anastrozole  1 mg Oral Daily  . apixaban  2.5 mg Oral Q12H  . celecoxib  200 mg Oral BID  . Chlorhexidine Gluconate Cloth  6 each Topical Q0600  . cholecalciferol  1,000 Units Oral Daily  . docusate sodium  100 mg Oral BID  . furosemide  30 mg Oral Daily  . gabapentin  300 mg Oral TID  . mouth rinse  15 mL Mouth Rinse BID  . metoprolol tartrate  25 mg Oral BID  . pantoprazole  40 mg Oral Daily  . polyethylene glycol  17 g Oral Once per day on Mon Tue Wed Thu Fri Sat  . sertraline  25 mg Oral Daily  . temazepam  15 mg Oral QHS   Continuous Infusions: . sodium chloride Stopped (02/06/19 0023)  . dextrose 5 % and 0.45 % NaCl with KCl 20 mEq/L 30 mL/hr at 02/07/19 0555  . methocarbamol (ROBAXIN) IV 500 mg (02/05/19 1734)   PRN Meds: acetaminophen, alum & mag hydroxide-simeth, benzonatate, bisacodyl, diphenhydrAMINE, HYDROmorphone (DILAUDID) injection, menthol-cetylpyridinium **OR** phenol, methocarbamol **OR** methocarbamol (ROBAXIN) IV, metoCLOPramide **OR** metoCLOPramide (REGLAN) injection, ondansetron **OR** ondansetron (ZOFRAN) IV, oxyCODONE, polyethylene glycol, sodium phosphate   Vital Signs    Vitals:   02/09/19 2147 02/10/19 0011 02/10/19 0217 02/10/19 0544  BP: 108/69 116/73 111/71 119/81  Pulse: (!) 121 (!) 122 (!) 120 (!) 120  Resp:  16 15 17   Temp:  99.1 F (37.3 C) 98.3 F (36.8 C) 97.8 F (36.6 C)  TempSrc:  Oral Oral Oral  SpO2: 97% 96% 97% 98%  Weight:      Height:        Intake/Output Summary (Last 24 hours) at 02/10/2019 0851 Last data filed at 02/10/2019 0400 Gross per 24 hour  Intake 1316.05 ml  Output -  Net 1316.05 ml    Last 3 Weights 02/06/2019 02/05/2019 01/30/2019  Weight (lbs) 116 lb 13.5 oz 117 lb 1 oz 116 lb  Weight (kg) 53 kg 53.1 kg 52.617 kg      Telemetry    AFlutter with 2:1 AV block 123 bpm - Personally Reviewed  ECG    Typical atrial flutter with 2: 1 AV block - Personally Reviewed  Physical Exam  Elderly, frail GEN: No acute distress.   Neck: No JVD Cardiac: RRR, tachycardic, no murmurs, rubs, or gallops.  Respiratory: Clear to auscultation bilaterally. GI: Soft, nontender, non-distended  MS: No edema; No deformity. Neuro:  Nonfocal  Psych: Normal affect   Labs    High Sensitivity Troponin:   Recent Labs  Lab 02/06/19 1241  TROPONINIHS <2      Chemistry Recent Labs  Lab 02/06/19 0157 02/07/19 0202 02/09/19 1108  NA 133* 132* 138  K 4.4 4.8 4.6  CL 102 105 106  CO2 22 21* 26  GLUCOSE 215* 165* 111*  BUN 20 30* 39*  CREATININE 0.64 1.01* 0.99  CALCIUM 8.0* 7.7* 8.3*  GFRNONAA >60 48* 49*  GFRAA >60 56* 57*  ANIONGAP 9 6 6      Hematology Recent Labs  Lab 02/06/19 0157 02/07/19 0202 02/08/19 0254  WBC 9.3 10.6* 7.5  RBC 3.33* 2.79* 3.60*  HGB  9.6* 8.1* 10.6*  HCT 30.2* 25.9* 32.5*  MCV 90.7 92.8 90.3  MCH 28.8 29.0 29.4  MCHC 31.8 31.3 32.6  RDW 14.4 14.6 14.6  PLT 164 126* 143*    BNPNo results for input(s): BNP, PROBNP in the last 168 hours.   DDimer No results for input(s): DDIMER in the last 168 hours.   Radiology    No results found.  Cardiac Studies   Echocardiogram 02/09/2019  1. Left ventricular ejection fraction, by visual estimation, is 55 to 60%. The left ventricle has normal function. There is no left ventricular hypertrophy.  2. Left ventricular diastolic parameters are indeterminate.  3. The left ventricle has no regional wall motion abnormalities.  4. Global right ventricle has normal systolic function.The right ventricular size is normal. No increase in right ventricular wall thickness.  5. Left atrial size was mildly  dilated.  6. Right atrial size was mild-moderately dilated.  7. Moderate pericardial effusion.  8. The pericardial effusion is circumferential.  9. Mild mitral annular calcification. 10. The mitral valve is normal in structure. Mild mitral valve regurgitation. 11. The tricuspid valve is normal in structure. Tricuspid valve regurgitation is trivial. 12. The aortic valve is tricuspid. Aortic valve regurgitation is trivial. Mild aortic valve sclerosis without stenosis. 13. There is Mild calcification of the aortic valve. 14. There is Mild thickening of the aortic valve. 15. The pulmonic valve was grossly normal. Pulmonic valve regurgitation is not visualized. 16. Normal pulmonary artery systolic pressure. 17. The inferior vena cava is normal in size with greater than 50% respiratory variability, suggesting right atrial pressure of 3 mmHg. 18. There is a moderate pericardial effusion, most apparent asjacent to RV, that appears to be mixed fluid and adherent material. There is no RA or RV collapse, but both mitral and tricuspid inflow velocities have significant respiratory variation. IVC  is not enlarged beyond range of normal and does have some respiratory collapse. No clear cut tamponade, but would correlate clinically. Findings communicated to L. [facility] APP and Dr. Marlou Porch.  Patient Profile     85 y.o. female with recurrent atrial flutter now 5 days after right hip surgery for fracture and pseudoarthrosis.  Assessment & Plan    1.  Aflutter: Typical counterclockwise right atrial flutter.  Will be heart rate control.  Continue adjusting AV nodal blocking agents, but suspect we may have to perform a TEE guided cardioversion on Monday. 2.  Pericardial effusion: on my review this is small and not hemodynamically significant.  No further evaluation or treatment is necessary at this time. 3. HTN: Transient postoperative hypotension as well as significant acute blood loss anemia.  Prefer use of  beta-blockers and/or calcium channel blockers which will also help with rate control.     For questions or updates, please contact [place] Please consult [email or web address] for contact info under        Signed, Sanda Klein, MD  02/10/2019, 8:51 AM

## 2019-02-10 NOTE — Progress Notes (Signed)
[facility] PROGRESS NOTE  Glenda [name] [id number] DOB: [birth year] DOA: 02/05/2019 PCP: Marin Olp, MD   Subjective  Seen with nursing staff at bedside, denies any complaint this morning. Denies any chest pain or shortness of breath.  Brief history  Patient is a 85 years old female with past medical history of paroxysmal atrial flutter, hypertension, mild aortic stenosis, chronic diastolic heart failure, hypertension, hyperlipidemia was admitted to hospital for total hip replacement.  Postoperative course was complicated with acute blood loss anemia and hypotension and required PRBC transfusion and vasopressors.  Subsequently, patient has improved with stable blood pressure.  Medical team was consulted today due to mention of tachycardia.  EKG was performed which showed atrial flutter.  She denies any chest pain, palpitation, dizziness or lightheadedness but has generalized weakness.  Patient denies nausea, vomiting or abdominal pain.  Denies constipation or diarrhea.  Has had a good bowel movement.  Denies urinary urgency, frequency or dysuria.  Assessment and plan   Principal Problem:   Osteoarthritis of right hip Active Problems:   Status post total replacement of right hip   Hypotension due to hypovolemia   A-flutter With 2-1 conduction, cardiology consulted.  TSH WNL. Has history of DCCV in June 2018 with conversion to NSR. Noted plans for possible DCCV on Monday.  Acute blood loss anemia   Hemoglobin dropped to 8.1 postoperatively, hemoglobin is 10.6 posttransfusion. Monitor hemoglobin closely.  Postoperative hypotension She required vasopressors and to be in a stepdown unit. Unclear if this is secondary to postoperative hemorrhage versus rapid heart rate.  Mild thrombocytopenia Continue to monitor closely.  AKI This is resolved.,  AKI 1.01 improved to 0.9.  Code Status: Full Code Family Communication: Plan discussed with the patient.  Disposition Plan: Remains inpatient Diet:  Diet Order            Diet regular Room service appropriate? Yes; Fluid consistency: Thin  Diet effective now              Consultants   Tried hospitalist.  Cardiology  Procedures  . Anterior right THA  Antibiotics   None   Objective   Vitals:   02/10/19 0544 02/10/19 1000  BP: 119/81 107/68  Pulse: (!) 120 (!) 124  Resp: 17 16  Temp: 97.8 F (36.6 C) 98.1 F (36.7 C)  SpO2: 98% 97%    Intake/Output Summary (Last 24 hours) at 02/10/2019 1213 Last data filed at 02/10/2019 0900 Gross per 24 hour  Intake 1436.05 ml  Output -  Net 1436.05 ml   Filed Weights   02/05/19 2020 02/06/19 1521  Weight: 53.1 kg 53 kg    Physical examination  General: Alert and awake, oriented x3, not in any acute distress. HEENT: anicteric sclera, pupils reactive to light and accommodation, EOMI CVS: S1-S2 clear, no murmur rubs or gallops Chest: clear to auscultation bilaterally, no wheezing, rales or rhonchi Abdomen: soft nontender, nondistended, normal bowel sounds, no organomegaly Extremities: no cyanosis, clubbing or edema noted bilaterally Neuro: Cranial nerves II-XII intact, no focal neurological deficits  Reviewed data  Basic Metabolic Panel: Recent Labs  Lab 02/06/19 0157 02/07/19 0202 02/09/19 1108  NA 133* 132* 138  K 4.4 4.8 4.6  CL 102 105 106  CO2 22 21* 26  GLUCOSE 215* 165* 111*  BUN 20 30* 39*  CREATININE 0.64 1.01* 0.99  CALCIUM 8.0* 7.7* 8.3*  MG  --   --  2.0   Liver Function Tests: No results for input(s): AST, ALT,  ALKPHOS, BILITOT, PROT, ALBUMIN in the last 168 hours. No results for input(s): LIPASE, AMYLASE in the last 168 hours. No results for input(s): AMMONIA in the last 168 hours. CBC: Recent Labs  Lab 02/05/19 1816 02/06/19 0157 02/07/19 0202 02/08/19 0254  WBC  --  9.3 10.6* 7.5  HGB 8.9* 9.6* 8.1* 10.6*  HCT 28.2* 30.2* 25.9* 32.5*  MCV  --  90.7 92.8 90.3  PLT  --  164 126* 143*    Cardiac Enzymes: No results for input(s): CKTOTAL, CKMB, CKMBINDEX, TROPONINI in the last 168 hours. BNP (last 3 results) No results for input(s): BNP in the last 8760 hours.  ProBNP (last 3 results) No results for input(s): PROBNP in the last 8760 hours.  CBG: No results for input(s): GLUCAP in the last 168 hours.  Micro Recent Results (from the past 240 hour(s))  Novel Coronavirus, NAA (Hosp order, Send-out to Ref Lab; TAT 18-24 hrs     Status: None   Collection Time: 02/01/19  9:57 AM   Specimen: Nasopharyngeal Swab; Respiratory  Result Value Ref Range Status   SARS-CoV-2, NAA NOT DETECTED NOT DETECTED Final    Comment: (NOTE) This nucleic acid amplification test was developed and its performance characteristics determined by [employer]. Nucleic acid amplification tests include PCR and TMA. This test has not been FDA cleared or approved. This test has been authorized by FDA under an Emergency Use Authorization (EUA). This test is only authorized for the duration of time the declaration that circumstances exist justifying the authorization of the emergency use of in vitro diagnostic tests for detection of SARS-CoV-2 virus and/or diagnosis of COVID-19 infection under section 564(b)(1) of the Act, 21 U.S.C. [id number]) (1), unless the authorization is terminated or revoked sooner. When diagnostic testing is negative, the possibility of a false negative result should be considered in the context of a patient's recent exposures and the presence of clinical signs and symptoms consistent with COVID-19. An individual without symptoms of COVID- 19 and who is not shedding SARS-CoV-2 vi rus would expect to have a negative (not detected) result in this assay. Performed At: [facility] [address] [place], [place] [id number] Rush Farmer MD [id number]    [place]  Final    Comment: Performed at [facility], [place] [address] [place], [place] [address]  SARS CORONAVIRUS 2 (TAT 6-24 HRS) Nasopharyngeal Nasopharyngeal Swab     Status: None   Collection Time: 02/08/19  6:54 PM   Specimen: Nasopharyngeal Swab  Result Value Ref Range Status   SARS Coronavirus 2 NEGATIVE NEGATIVE Final    Comment: (NOTE) SARS-CoV-2 target nucleic acids are NOT DETECTED. The SARS-CoV-2 RNA is generally detectable in upper and lower respiratory specimens during the acute phase of infection. Negative results do not preclude SARS-CoV-2 infection, do not rule out co-infections with other pathogens, and should not be used as the sole basis for treatment or other patient management decisions. Negative results must be combined with clinical observations, patient history, and epidemiological information. The expected result is Negative. Fact Sheet for Patients: SugarRoll.be Fact Sheet for Healthcare Providers: https://www.woods-mathews.com/ This test is not yet approved or cleared by the [place] FDA and  has been authorized for detection and/or diagnosis of SARS-CoV-2 by FDA under an Emergency Use Authorization (EUA). This EUA will remain  in effect (meaning this test can be used) for the duration of the COVID-19 declaration under Section 56 4(b)(1) of the Act, 21 U.S.C. section 360bbb-3(b)(1), unless the  authorization is terminated or revoked sooner. Performed at [facility], [place] [address] [place], [place] [address]      Radiological studies, reviewed  No results found.  Scheduled Meds: . sodium chloride   Intravenous Once  . sodium chloride   Intravenous Once  . anastrozole  1 mg Oral Daily  . apixaban  2.5 mg Oral Q12H  . celecoxib  200 mg Oral BID  . Chlorhexidine Gluconate Cloth  6 each Topical Q0600  . cholecalciferol  1,000 Units Oral Daily  . docusate sodium  100 mg Oral BID  . furosemide  30 mg Oral Daily  . gabapentin  300 mg Oral TID  . mouth rinse  15 mL  Mouth Rinse BID  . metoprolol tartrate  50 mg Oral BID  . pantoprazole  40 mg Oral Daily  . polyethylene glycol  17 g Oral Once per day on Mon Tue Wed Thu Fri Sat  . sertraline  25 mg Oral Daily  . temazepam  15 mg Oral QHS   Continuous Infusions: . sodium chloride Stopped (02/06/19 0023)  . dextrose 5 % and 0.45 % NaCl with KCl 20 mEq/L 30 mL/hr at 02/07/19 0555  . methocarbamol (ROBAXIN) IV 500 mg (02/05/19 1734)     Time spent: 35 minutes  [facility] Pager [phone] If 7PM-7AM, please contact night-coverage at [email or web address], password [facility] 02/10/2019, 12:13 PM  LOS: 5 days

## 2019-02-10 NOTE — Progress Notes (Signed)
   PATIENT ID: Glenda [name]   5 Days Post-Op Procedure(s) (LRB): RIGH TOTAL HIP ARTHROPLASTY ANTERIOR APPROACH AND REMOVAL OF SCREWS (Right)  Subjective: Doing well, min hip pain. Getting up with PT and up for the bathroom and tolerating that well.    Objective:  Vitals:   02/10/19 0217 02/10/19 0544  BP: 111/71 119/81  Pulse: (!) 120 (!) 120  Resp: 15 17  Temp: 98.3 F (36.8 C) 97.8 F (36.6 C)  SpO2: 97% 98%     Neurologically intact Neurovascular intact Sensation intact distally Intact pulses distally Dorsiflexion/Plantar flexion intact Incision: dressing C/D/I and no drainage No cellulitis present Compartment soft  Labs:  Recent Labs    02/08/19 0254  HGB 10.6*   Recent Labs    02/08/19 0254  WBC 7.5  RBC 3.60*  HCT 32.5*  PLT 143*   Recent Labs    02/09/19 1108  NA 138  K 4.6  CL 106  CO2 26  BUN 39*  CREATININE 0.99  GLUCOSE 111*  CALCIUM 8.3*    Assessment and Plan: 5 Days Post-Op Procedure(s) (LRB): RIGH TOTAL HIP ARTHROPLASTY ANTERIOR APPROACH AND REMOVAL OF SCREWS (Right) ADDITIONAL DIAGNOSIS:  Expected Acute Blood Loss Anemia, History of palpitations,Atrial fibrillation, aortic stenosis, long-term use of Eliquis, chronic pain syndrome, chronic diastolic heart failure Anticipated LOS equal to or greater than 2 midnights due to - Age 83 and older with one or more of the following:             - Obesity             - Expected need for hospital services (PT, OT, Nursing) required for safe   discharge             - Anticipated need for postoperative skilled nursing care or inpatient rehab             - Active co-morbidities: Cardiac Arrhythmia OR   - Unanticipated findings during/Post Surgery: Slow post-op progression: GI, pain control, mobility      Plan: PT/OT WBAT, THA  DVT Prophylaxis: SCDx72 hrs, Eliquis 2.5 mg BID  DISCHARGE PLAN: Home  DISCHARGE NEEDS: HHPT, Walker and 3-in-1 comode seat   Cardiology consulted and  echo performed, remains in atrial flutter, will continue to treat and observe over the weekend, possible cardioversion Monday. We appreciate their help with this patient

## 2019-02-11 ENCOUNTER — Inpatient Hospital Stay (HOSPITAL_COMMUNITY): Payer: Medicare Other

## 2019-02-11 DIAGNOSIS — I313 Pericardial effusion (noninflammatory): Secondary | ICD-10-CM

## 2019-02-11 DIAGNOSIS — C801 Malignant (primary) neoplasm, unspecified: Secondary | ICD-10-CM

## 2019-02-11 DIAGNOSIS — R0781 Pleurodynia: Secondary | ICD-10-CM

## 2019-02-11 MED ORDER — SODIUM CHLORIDE (PF) 0.9 % IJ SOLN
INTRAMUSCULAR | Status: AC
  Filled 2019-02-11: qty 50

## 2019-02-11 MED ORDER — IOHEXOL 350 MG/ML SOLN
80.0000 mL | Freq: Once | INTRAVENOUS | Status: AC | PRN
  Administered 2019-02-11: 80 mL via INTRAVENOUS

## 2019-02-11 NOTE — Progress Notes (Signed)
Pt's heart rate has sustained in low 130's. Pt is asymptomatic other than the chest discomfort she felt this am with deep breaths. Md notified and will continue to monitor for now and notify if becomes symptomatic. Eulas Post, RN

## 2019-02-11 NOTE — Progress Notes (Signed)
Progress Note  Patient Name: Glenda [name] Date of Encounter: 02/11/2019  Primary Cardiologist: Kirk Ruths, MD   Subjective   Starting yesterday evening, she has experienced chest discomfort with each breath.  If she takes a very deep breath, the pain is more intense.  It is retrosternal, rather than localized on the right or left side.  She denies shortness of breath.  She is very weak and has difficulty standing so far on the operated leg. Remains in atrial flutter with rapid ventricular response, ventricular rate in the 120s.  Mostly 2: 1 AV conduction, occasionally with higher level of block. Blood pressure is acceptable, but probably precludes higher doses of AV nodal blocking agents.  Inpatient Medications    Scheduled Meds: . sodium chloride   Intravenous Once  . sodium chloride   Intravenous Once  . anastrozole  1 mg Oral Daily  . apixaban  2.5 mg Oral Q12H  . celecoxib  200 mg Oral BID  . Chlorhexidine Gluconate Cloth  6 each Topical Q0600  . cholecalciferol  1,000 Units Oral Daily  . docusate sodium  100 mg Oral BID  . furosemide  30 mg Oral Daily  . gabapentin  300 mg Oral TID  . mouth rinse  15 mL Mouth Rinse BID  . metoprolol tartrate  50 mg Oral BID  . pantoprazole  40 mg Oral Daily  . polyethylene glycol  17 g Oral Once per day on Mon Tue Wed Thu Fri Sat  . sertraline  25 mg Oral Daily  . temazepam  15 mg Oral QHS   Continuous Infusions: . sodium chloride Stopped (02/06/19 0023)  . dextrose 5 % and 0.45 % NaCl with KCl 20 mEq/L 30 mL/hr at 02/07/19 0555  . methocarbamol (ROBAXIN) IV 500 mg (02/05/19 1734)   PRN Meds: acetaminophen, alum & mag hydroxide-simeth, benzonatate, bisacodyl, diphenhydrAMINE, HYDROmorphone (DILAUDID) injection, menthol-cetylpyridinium **OR** phenol, methocarbamol **OR** methocarbamol (ROBAXIN) IV, metoCLOPramide **OR** metoCLOPramide (REGLAN) injection, ondansetron **OR** ondansetron (ZOFRAN) IV, oxyCODONE, polyethylene glycol,  sodium phosphate   Vital Signs    Vitals:   02/10/19 2321 02/10/19 2331 02/11/19 0545 02/11/19 0813  BP: 114/80 114/80 103/77 111/75  Pulse: (!) 125 (!) 125 (!) 124 (!) 121  Resp:   18 20  Temp:   98.7 F (37.1 C)   TempSrc:      SpO2:   96% 94%  Weight:      Height:        Intake/Output Summary (Last 24 hours) at 02/11/2019 0845 Last data filed at 02/11/2019 0600 Gross per 24 hour  Intake 1460 ml  Output -  Net 1460 ml   Last 3 Weights 02/06/2019 02/05/2019 01/30/2019  Weight (lbs) 116 lb 13.5 oz 117 lb 1 oz 116 lb  Weight (kg) 53 kg 53.1 kg 52.617 kg      Telemetry    Atrial flutter with variable AV block and rapid ventricular response- Personally Reviewed  ECG    ECG today ordered, pending- Personally Reviewed  Physical Exam  Appears comfortable, breathing is not labored GEN: No acute distress.   Neck: No JVD Cardiac:  Regular tachycardia, occasionally irregular, no murmurs, rubs, or gallops.  Respiratory: Clear to auscultation bilaterally.  No pleural rubs are heard GI: Soft, nontender, non-distended  MS: No edema/tenderness; compression stockings on.  No deformity. Neuro:  Nonfocal  Psych: Normal affect   Labs    High Sensitivity Troponin:   Recent Labs  Lab 02/06/19 1241  TROPONINIHS <2  Chemistry Recent Labs  Lab 02/06/19 0157 02/07/19 0202 02/09/19 1108  NA 133* 132* 138  K 4.4 4.8 4.6  CL 102 105 106  CO2 22 21* 26  GLUCOSE 215* 165* 111*  BUN 20 30* 39*  CREATININE 0.64 1.01* 0.99  CALCIUM 8.0* 7.7* 8.3*  GFRNONAA >60 48* 49*  GFRAA >60 56* 57*  ANIONGAP 9 6 6      Hematology Recent Labs  Lab 02/06/19 0157 02/07/19 0202 02/08/19 0254  WBC 9.3 10.6* 7.5  RBC 3.33* 2.79* 3.60*  HGB 9.6* 8.1* 10.6*  HCT 30.2* 25.9* 32.5*  MCV 90.7 92.8 90.3  MCH 28.8 29.0 29.4  MCHC 31.8 31.3 32.6  RDW 14.4 14.6 14.6  PLT 164 126* 143*    BNPNo results for input(s): BNP, PROBNP in the last 168 hours.   DDimer No results for  input(s): DDIMER in the last 168 hours.   Radiology    No results found.  Cardiac Studies   Echocardiogram 02/09/2019 1. Left ventricular ejection fraction, by visual estimation, is 55 to 60%. The left ventricle has normal function. There is no left ventricular hypertrophy. 2. Left ventricular diastolic parameters are indeterminate. 3. The left ventricle has no regional wall motion abnormalities. 4. Global right ventricle has normal systolic function.The right ventricular size is normal. No increase in right ventricular wall thickness. 5. Left atrial size was mildly dilated. 6. Right atrial size was mild-moderately dilated. 7. Moderate pericardial effusion. 8. The pericardial effusion is circumferential. 9. Mild mitral annular calcification. 10. The mitral valve is normal in structure. Mild mitral valve regurgitation. 11. The tricuspid valve is normal in structure. Tricuspid valve regurgitation is trivial. 12. The aortic valve is tricuspid. Aortic valve regurgitation is trivial. Mild aortic valve sclerosis without stenosis. 13. There is Mild calcification of the aortic valve. 14. There is Mild thickening of the aortic valve. 15. The pulmonic valve was grossly normal. Pulmonic valve regurgitation is not visualized. 16. Normal pulmonary artery systolic pressure. 17. The inferior vena cava is normal in size with greater than 50% respiratory variability, suggesting right atrial pressure of 3 mmHg. 18. There is a moderate pericardial effusion, most apparent asjacent to RV, that appears to be mixed fluid and adherent material. There is no RA or RV collapse, but both mitral and tricuspid inflow velocities have significant respiratory variation. IVC is not enlarged beyond range of normal and does have some respiratory collapse. No clear cut tamponade, but would correlate clinically. Findings communicated to L. [facility] APP and Dr. Marlou Porch.  Patient Profile     85 y.o. female 6 days status  post right hip surgery for fracture/pseudoarthrosis, and persistent atrial flutter with a rapid ventricular response, new complaints of pleuritic chest discomfort in the last 24 hours.  Assessment & Plan    1.  Pleuritic chest pain: Concerning for pulmonary embolism in a patient with recent hip surgery.  She is on a low-dose of apixaban.  Check CT angiography for evidence of pulmonary embolism, which would require treatment with a higher dose of anticoagulant. 2.  Atrial flutter: Ventricular rate remains fast and I doubt will be able to provide adequate rate control.  We will schedule for TEE-cardioversion tomorrow, barring major surprises on the CT angiogram of the chest.  Both transesophageal echocardiogram and direct-current cardioversion procedures have been fully reviewed with the patient and informed consent has been obtained. 3.  Pericardial effusion: The etiology remains uncertain, but this could also cause pleuritic discomfort, if she has any inflammatory pericarditis.  If there is no evidence of pulmonary embolism, repeat a limited echocardiogram to look for change in effusion size.  No clinical evidence of tamponade.  ECG pending.      For questions or updates, please contact [place] Please consult [email or web address] for contact info under        Signed, Sanda Klein, MD  02/11/2019, 8:45 AM

## 2019-02-11 NOTE — Progress Notes (Signed)
Physical Therapy Treatment Patient Details Name: Glenda [name] MRN: [id number] DOB: [birth year] Today's Date: 02/11/2019    History of Present Illness Pt is 85 yo female s/p direct anterior R THA.  Pt with prior R femur fracture ealier this year.  PMH includes HTN, CHF, anxiety, see H and P for further history.  Post-op pt has had difficulty with hypotension. She received 2 units of PRBC on 02/07/19. Pt continues to have tachycardia  and was transferred to telemetry on 02/09/19 and followed by hospitalist and cardiologist with medication changes for A Flutter. .    PT Comments    Progressing slowly with mobility. Pt continues to require Min-Mod assist for mobility. HR up to 133 bpm during ambulation on today. Highly recommend 24/7 supervision/assist should pt return to her ALF. Per chart, plan is for possible cardioversion on Monday. Will continue to follow.     Follow Up Recommendations  Follow surgeon's recommendation for DC plan and follow-up therapies;Supervision/Assistance - 24 hour;SNF(may need to consider SNF placement if pt will not have 24/7 supervision/assist)     Equipment Recommendations  Rolling walker with 5" wheels(youth height)    Recommendations for Other Services       Precautions / Restrictions Precautions Precautions: Fall Precaution Comments: Monitor BP and HR Restrictions Weight Bearing Restrictions: No RLE Weight Bearing: Weight bearing as tolerated    Mobility  Bed Mobility Overal bed mobility: Needs Assistance Bed Mobility: Supine to Sit     Supine to sit: Mod assist;HOB elevated     General bed mobility comments: Assist for bil LEs and trunk to upright. Also required assist to scoot to EOB. Poor use of bil UEs.  Transfers Overall transfer level: Needs assistance Equipment used: Rolling walker (2 wheeled) Transfers: Sit to/from Stand Sit to Stand: Min assist         General transfer comment: VCs safety, technique, hand placement. Assist  to rise, stabilize, control descent. Increased time. Stand pivot, bed to bsc, with RW.  Ambulation/Gait Ambulation/Gait assistance: Min assist;+2 safety/equipment Gait Distance (Feet): 15 Feet Assistive device: Rolling walker (2 wheeled) Gait Pattern/deviations: Step-to pattern;Trunk flexed;Decreased step length - right;Decreased step length - left     General Gait Details: Assist to stabilize pt throughout short distance. Distance limited by pt fatigue and increased HR. HR up to 133 bpm with ambulation. Followed with recliner.   Stairs             Wheelchair Mobility    Modified Rankin (Stroke Patients Only)       Balance Overall balance assessment: Needs assistance         Standing balance support: Bilateral upper extremity supported Standing balance-Leahy Scale: Poor                              Cognition Arousal/Alertness: Awake/alert Behavior During Therapy: WFL for tasks assessed/performed Overall Cognitive Status: No family/caregiver present to determine baseline cognitive functioning                                 General Comments: Forgetfulness at times      Exercises      General Comments        Pertinent Vitals/Pain Pain Assessment: Faces Faces Pain Scale: Hurts little more Pain Location: R hip Pain Descriptors / Indicators: Discomfort;Sore Pain Intervention(s): Limited activity within patient's tolerance;Monitored during session;Repositioned    Home  Living                      Prior Function            PT Goals (current goals can now be found in the care plan section) Progress towards PT goals: Progressing toward goals    Frequency    7X/week      PT Plan Current plan remains appropriate    Co-evaluation              AM-PAC PT "6 Clicks" Mobility   Outcome Measure  Help needed turning from your back to your side while in a flat bed without using bedrails?: A Lot Help needed  moving from lying on your back to sitting on the side of a flat bed without using bedrails?: A Lot Help needed moving to and from a bed to a chair (including a wheelchair)?: A Little Help needed standing up from a chair using your arms (e.g., wheelchair or bedside chair)?: A Little Help needed to walk in hospital room?: A Little Help needed climbing 3-5 steps with a railing? : A Lot 6 Click Score: 15    End of Session Equipment Utilized During Treatment: Gait belt Activity Tolerance: Patient limited by fatigue Patient left: in chair;with call bell/phone within reach;with chair alarm set   PT Visit Diagnosis: Difficulty in walking, not elsewhere classified (R26.2);Muscle weakness (generalized) (M62.81);History of falling (Z91.81);Unsteadiness on feet (R26.81)     Time: 1030-1050 PT Time Calculation (min) (ACUTE ONLY): 20 min  Charges:  $Gait Training: 8-22 mins                       Weston Anna, PT Acute Rehabilitation Services Pager: [phone] Office: [phone]

## 2019-02-11 NOTE — Progress Notes (Signed)
    Message sent to schedule TEE/DCCV for tomorrow 02/12/2019 for atrial flutter. Made NPO after MDN. Rounding provider to place orders once scheduled.    Kathyrn Drown NP-C [place] Pager: [phone]

## 2019-02-11 NOTE — Progress Notes (Signed)
[facility] PROGRESS NOTE  Glenda [name] [id number] DOB: Jan 22, 1925 DOA: 02/05/2019 PCP: Marin Olp, MD   Subjective  Seen with nursing staff at bedside, complained about chest pain when she takes deep breath. Discussed with cardiology. CT angiography of the chest was done and showed no evidence of PE TEE/DCCV scheduled for tomorrow for atrial flutter.  Brief history  Patient is a 85 years old female with past medical history of paroxysmal atrial flutter, hypertension, mild aortic stenosis, chronic diastolic heart failure, hypertension, hyperlipidemia was admitted to hospital for total hip replacement.  Postoperative course was complicated with acute blood loss anemia and hypotension and required PRBC transfusion and vasopressors.  Subsequently, patient has improved with stable blood pressure.  Medical team was consulted today due to mention of tachycardia.  EKG was performed which showed atrial flutter.  She denies any chest pain, palpitation, dizziness or lightheadedness but has generalized weakness.  Patient denies nausea, vomiting or abdominal pain.  Denies constipation or diarrhea.  Has had a good bowel movement.  Denies urinary urgency, frequency or dysuria.  Assessment and plan   Principal Problem:   Osteoarthritis of right hip Active Problems:   Status post total replacement of right hip   Hypotension due to hypovolemia   A-flutter With 2-1 conduction, cardiology consulted.  TSH WNL. Has history of DCCV in June 2018 with conversion to NSR. Noted plans for possible DCCV on Monday. Complained about pleuritic chest pain, negative CT scan.  Acute blood loss anemia   Hemoglobin dropped to 8.1 postoperatively, hemoglobin is 10.6 posttransfusion. Monitor hemoglobin closely.  Postoperative hypotension She required vasopressors and to be in a stepdown unit. Unclear if this is secondary to postoperative hemorrhage versus rapid heart rate.  Mild  thrombocytopenia Continue to monitor closely.  AKI This is resolved.,  AKI 1.01 improved to 0.9.  Code Status: Full Code Family Communication: Plan discussed with the patient. Disposition Plan: Remains inpatient Diet:  Diet Order            Diet NPO time specified  Diet effective midnight        Diet NPO time specified  Diet effective midnight        Diet regular Room service appropriate? Yes; Fluid consistency: Thin  Diet effective now              Consultants   Tried hospitalist.  Cardiology  Procedures   Anterior right THA  Antibiotics   None   Objective   Vitals:   02/11/19 0545 02/11/19 0813  BP: 103/77 111/75  Pulse: (!) 124 (!) 121  Resp: 18 20  Temp: 98.7 F (37.1 C)   SpO2: 96% 94%    Intake/Output Summary (Last 24 hours) at 02/11/2019 1122 Last data filed at 02/11/2019 1000 Gross per 24 hour  Intake 1340 ml  Output --  Net 1340 ml   Filed Weights   02/05/19 2020 02/06/19 1521  Weight: 53.1 kg 53 kg    Physical examination  General: Alert and awake, oriented x3, not in any acute distress. HEENT: anicteric sclera, pupils reactive to light and accommodation, EOMI CVS: S1-S2 clear, no murmur rubs or gallops Chest: clear to auscultation bilaterally, no wheezing, rales or rhonchi Abdomen: soft nontender, nondistended, normal bowel sounds, no organomegaly Extremities: no cyanosis, clubbing or edema noted bilaterally Neuro: Cranial nerves II-XII intact, no focal neurological deficits  Reviewed data  Basic Metabolic Panel: Recent Labs  Lab 02/06/19 0157 02/07/19 0202 02/09/19 1108  NA 133* 132* 138  K 4.4 4.8 4.6  CL 102 105 106  CO2 22 21* 26  GLUCOSE 215* 165* 111*  BUN 20 30* 39*  CREATININE 0.64 1.01* 0.99  CALCIUM 8.0* 7.7* 8.3*  MG  --   --  2.0   Liver Function Tests: No results for input(s): AST, ALT, ALKPHOS, BILITOT, PROT, ALBUMIN in the last 168 hours. No results for input(s): LIPASE, AMYLASE in the last 168  hours. No results for input(s): AMMONIA in the last 168 hours. CBC: Recent Labs  Lab 02/05/19 1816 02/06/19 0157 02/07/19 0202 02/08/19 0254  WBC  --  9.3 10.6* 7.5  HGB 8.9* 9.6* 8.1* 10.6*  HCT 28.2* 30.2* 25.9* 32.5*  MCV  --  90.7 92.8 90.3  PLT  --  164 126* 143*   Cardiac Enzymes: No results for input(s): CKTOTAL, CKMB, CKMBINDEX, TROPONINI in the last 168 hours. BNP (last 3 results) No results for input(s): BNP in the last 8760 hours.  ProBNP (last 3 results) No results for input(s): PROBNP in the last 8760 hours.  CBG: No results for input(s): GLUCAP in the last 168 hours.  Micro Recent Results (from the past 240 hour(s))  SARS CORONAVIRUS 2 (TAT 6-24 HRS) Nasopharyngeal Nasopharyngeal Swab     Status: None   Collection Time: 02/08/19  6:54 PM   Specimen: Nasopharyngeal Swab  Result Value Ref Range Status   SARS Coronavirus 2 NEGATIVE NEGATIVE Final    Comment: (NOTE) SARS-CoV-2 target nucleic acids are NOT DETECTED. The SARS-CoV-2 RNA is generally detectable in upper and lower respiratory specimens during the acute phase of infection. Negative results do not preclude SARS-CoV-2 infection, do not rule out co-infections with other pathogens, and should not be used as the sole basis for treatment or other patient management decisions. Negative results must be combined with clinical observations, patient history, and epidemiological information. The expected result is Negative. Fact Sheet for Patients: SugarRoll.be Fact Sheet for Healthcare Providers: https://www.woods-mathews.com/ This test is not yet approved or cleared by the [place] FDA and  has been authorized for detection and/or diagnosis of SARS-CoV-2 by FDA under an Emergency Use Authorization (EUA). This EUA will remain  in effect (meaning this test can be used) for the duration of the COVID-19 declaration under Section 56 4(b)(1) of the Act, 21  U.S.C. section 360bbb-3(b)(1), unless the authorization is terminated or revoked sooner. Performed at [facility], [place] [address] [place], [place] [address]      Radiological studies, reviewed  Ct Angio Chest Pe W Or Wo Contrast  Result Date: 02/11/2019 CLINICAL DATA:  Chest pain and tachycardia. Pulmonary embolus suspected. EXAM: CT ANGIOGRAPHY CHEST WITH CONTRAST TECHNIQUE: Multidetector CT imaging of the chest was performed using the standard protocol during bolus administration of intravenous contrast. Multiplanar CT image reconstructions and MIPs were obtained to evaluate the vascular anatomy. CONTRAST:  35mL OMNIPAQUE IOHEXOL 350 MG/ML SOLN COMPARISON:  10/06/2015 FINDINGS: Cardiovascular: The heart size is normal. No substantial pericardial effusion. Coronary artery calcification is evident. Atherosclerotic calcification is noted in the wall of the thoracic aorta. Enlargement of the main pulmonary arteries suggests pulmonary arterial hypertension. No filling defect within the opacified pulmonary arteries to suggest the presence of an acute pulmonary embolus. Mediastinum/Nodes: No mediastinal lymphadenopathy. No evidence for gross hilar lymphadenopathy although assessment is limited by the lack of intravenous contrast on today's study. There is no axillary lymphadenopathy. Food/debris in the esophageal lumen suggests reflux or dysmotility. Lungs/Pleura: Biapical pleuroparenchymal scarring. Left base collapse/consolidation is associated with  a tiny left pleural effusion. No pulmonary edema. No suspicious pulmonary nodule or mass. There is some minimal collapse/consolidation in the posterior lingula. Upper Abdomen: Unremarkable. Musculoskeletal: No worrisome lytic or sclerotic osseous abnormality. Review of the MIP images confirms the above findings. IMPRESSION: 1. No CT evidence for acute pulmonary embolus. 2. Enlargement of the main pulmonary arteries suggests pulmonary arterial  hypertension. 3. Left base collapse/consolidation with tiny left pleural effusion. 4. Aortic Atherosclerosis (ICD10-I70.0). Electronically Signed   By: Misty Stanley M.D.   On: 02/11/2019 10:01    Scheduled Meds:  sodium chloride   Intravenous Once   sodium chloride   Intravenous Once   anastrozole  1 mg Oral Daily   apixaban  2.5 mg Oral Q12H   celecoxib  200 mg Oral BID   Chlorhexidine Gluconate Cloth  6 each Topical Q0600   cholecalciferol  1,000 Units Oral Daily   docusate sodium  100 mg Oral BID   furosemide  30 mg Oral Daily   gabapentin  300 mg Oral TID   mouth rinse  15 mL Mouth Rinse BID   metoprolol tartrate  50 mg Oral BID   pantoprazole  40 mg Oral Daily   polyethylene glycol  17 g Oral Once per day on Mon Tue Wed Thu Fri Sat   sertraline  25 mg Oral Daily   sodium chloride (PF)       temazepam  15 mg Oral QHS   Continuous Infusions:  sodium chloride Stopped (02/06/19 0023)   dextrose 5 % and 0.45 % NaCl with KCl 20 mEq/L 30 mL/hr at 02/07/19 0555   methocarbamol (ROBAXIN) IV 500 mg (02/05/19 1734)     Time spent: 35 minutes  [facility] Pager [phone] If 7PM-7AM, please contact night-coverage at [email or web address], password [facility] 02/11/2019, 11:22 AM  LOS: 6 days

## 2019-02-11 NOTE — Progress Notes (Signed)
   PATIENT ID: Glenda [name]   6 Days Post-Op Procedure(s) (LRB): RIGH TOTAL HIP ARTHROPLASTY ANTERIOR APPROACH AND REMOVAL OF SCREWS (Right)  Subjective: No complaints right hip. Chest pain with inspiration overnight and getting CT angiogram this am. TEE scheduled for tomorrow am.   Objective:  Vitals:   02/11/19 0545 02/11/19 0813  BP: 103/77 111/75  Pulse: (!) 124 (!) 121  Resp: 18 20  Temp: 98.7 F (37.1 C)   SpO2: 96% 94%     Neurologically intact Neurovascular intact Sensation intact distally Intact pulses distally Dorsiflexion/Plantar flexion intact Incision:dressing C/D/I and no drainage No cellulitis present Compartment soft  Labs:  No results for input(s): HGB in the last 72 hours.No results for input(s): WBC, RBC, HCT, PLT in the last 72 hours. Recent Labs    02/09/19 1108  NA 138  K 4.6  CL 106  CO2 26  BUN 39*  CREATININE 0.99  GLUCOSE 111*  CALCIUM 8.3*    Assessment and Plan: PT/OT WBAT, THA  DVT Prophylaxis: SCDx72 hrs,Eliquis 2.5 mg BID  DISCHARGE PLAN:Home  DISCHARGE NEEDS:HHPT, Walker and 3-in-1 comode seat  Cardiology and Internal med involved. CT angio pending. TEE cardioversion scheduled for tomorrow. We appreciate all help.

## 2019-02-12 ENCOUNTER — Inpatient Hospital Stay (HOSPITAL_COMMUNITY): Payer: Medicare Other

## 2019-02-12 ENCOUNTER — Encounter (HOSPITAL_COMMUNITY)
Admission: RE | Disposition: A | Discharge status: Home Health | Payer: Self-pay | Source: Home / Self Care | Attending: Orthopedic Surgery

## 2019-02-12 ENCOUNTER — Encounter (HOSPITAL_COMMUNITY): Payer: Self-pay | Admitting: Emergency Medicine

## 2019-02-12 ENCOUNTER — Inpatient Hospital Stay (HOSPITAL_COMMUNITY): Payer: Medicare Other | Admitting: Certified Registered Nurse Anesthetist

## 2019-02-12 DIAGNOSIS — I9589 Other hypotension: Secondary | ICD-10-CM

## 2019-02-12 DIAGNOSIS — E861 Hypovolemia: Secondary | ICD-10-CM

## 2019-02-12 DIAGNOSIS — M1611 Unilateral primary osteoarthritis, right hip: Principal | ICD-10-CM

## 2019-02-12 LAB — CBC
HCT: 34 % — ABNORMAL LOW (ref 36.0–46.0)
Hemoglobin: 10.7 g/dL — ABNORMAL LOW (ref 12.0–15.0)
MCH: 29.3 pg (ref 26.0–34.0)
MCHC: 31.5 g/dL (ref 30.0–36.0)
MCV: 93.2 fL (ref 80.0–100.0)
Platelets: 211 10*3/uL (ref 150–400)
RBC: 3.65 MIL/uL — ABNORMAL LOW (ref 3.87–5.11)
RDW: 14.9 % (ref 11.5–15.5)
WBC: 7.1 10*3/uL (ref 4.0–10.5)
nRBC: 0 % (ref 0.0–0.2)

## 2019-02-12 SURGERY — CANCELLED PROCEDURE
Anesthesia: General

## 2019-02-12 MED ORDER — SODIUM CHLORIDE 0.9 % IV BOLUS
250.0000 mL | Freq: Once | INTRAVENOUS | Status: AC
  Administered 2019-02-12: 250 mL via INTRAVENOUS

## 2019-02-12 MED ORDER — SODIUM CHLORIDE 0.9 % IV BOLUS
500.0000 mL | Freq: Once | INTRAVENOUS | Status: AC
  Administered 2019-02-12: 500 mL via INTRAVENOUS

## 2019-02-12 NOTE — Anesthesia Preprocedure Evaluation (Deleted)
Anesthesia Evaluation  Patient identified by MRN, date of birth, ID band Patient awake    Reviewed: Allergy & Precautions, NPO status , Patient's Chart, lab work & pertinent test results  Airway Mallampati: III  TM Distance: >3 FB Neck ROM: Full  Mouth opening: Limited Mouth Opening  Dental no notable dental hx. (+) Teeth Intact, Dental Advisory Given   Pulmonary neg pulmonary ROS,    Pulmonary exam normal breath sounds clear to auscultation       Cardiovascular hypertension, +CHF  Normal cardiovascular exam+ dysrhythmias (on eliquis) Atrial Fibrillation  Rhythm:Irregular Rate:Normal  TTE 01/2019  1. Left ventricular ejection fraction, by visual estimation, is 55 to 60%. The left ventricle has normal function. There is no left ventricular hypertrophy.  2. Left ventricular diastolic parameters are indeterminate.  3. The left ventricle has no regional wall motion abnormalities.  4. Global right ventricle has normal systolic function.The right ventricular size is normal. No increase in right ventricular wall thickness.  5. Left atrial size was mildly dilated.  6. Right atrial size was mild-moderately dilated.  7. Moderate pericardial effusion.  8. The pericardial effusion is circumferential.  9. Mild mitral annular calcification. 10. The mitral valve is normal in structure. Mild mitral valve regurgitation. 11. The tricuspid valve is normal in structure. Tricuspid valve regurgitation is trivial. 12. The aortic valve is tricuspid. Aortic valve regurgitation is trivial. Mild aortic valve sclerosis without stenosis. 13. There is Mild calcification of the aortic valve. 14. There is Mild thickening of the aortic valve. 15. The pulmonic valve was grossly normal. Pulmonic valve regurgitation is not visualized. 16. Normal pulmonary artery systolic pressure. 17. The inferior vena cava is normal in size with greater than 50% respiratory  variability, suggesting right atrial pressure of 3 mmHg. 18. There is a moderate pericardial effusion, most apparent asjacent to RV, that appears to be mixed fluid and adherent material. There is no RA or RV collapse, but both mitral and tricuspid inflow velocities have significant respiratory variation. IVC  is not enlarged beyond range of normal and does have some respiratory collapse. No clear cut tamponade, but would correlate clinically. Findings communicated to [facility] and Dr. Marlou Porch.   Neuro/Psych PSYCHIATRIC DISORDERS Anxiety negative neurological ROS     GI/Hepatic Neg liver ROS, hiatal hernia, GERD  Medicated,  Endo/Other  negative endocrine ROS  Renal/GU negative Renal ROS  negative genitourinary   Musculoskeletal  (+) Arthritis ,   Abdominal   Peds  Hematology  (+) Blood dyscrasia (Hgb 10.7), anemia ,   Anesthesia Other Findings   Reproductive/Obstetrics                            Anesthesia Physical Anesthesia Plan  ASA: III  Anesthesia Plan: General   Post-op Pain Management:    Induction: Intravenous  PONV Risk Score and Plan: 3 and Propofol infusion and Treatment may vary due to age or medical condition  Airway Management Planned: Natural Airway  Additional Equipment:   Intra-op Plan:   Post-operative Plan:   Informed Consent: I have reviewed the patients History and Physical, chart, labs and discussed the procedure including the risks, benefits and alternatives for the proposed anesthesia with the patient or authorized representative who has indicated his/her understanding and acceptance.     Dental advisory given  Plan Discussed with: CRNA  Anesthesia Plan Comments: (Pt in NSR. Case cancelled. )      Anesthesia Quick Evaluation

## 2019-02-12 NOTE — Plan of Care (Signed)
  Problem: Clinical Measurements: Goal: Ability to maintain clinical measurements within normal limits will improve Outcome: Progressing Goal: Cardiovascular complication will be avoided Outcome: Progressing   Problem: Activity: Goal: Risk for activity intolerance will decrease Outcome: Progressing   Problem: Pain Managment: Goal: General experience of comfort will improve Outcome: Completed/Met   Problem: Safety: Goal: Ability to remain free from injury will improve Outcome: Completed/Met   Problem: Education: Goal: Knowledge of General Education information will improve Description: Including pain rating scale, medication(s)/side effects and non-pharmacologic comfort measures Outcome: Completed/Met

## 2019-02-12 NOTE — Progress Notes (Addendum)
Progress Note  Patient Name: Glenda [name] Date of Encounter: 02/12/2019  Primary Cardiologist: Kirk Ruths, MD   Subjective   Has not been feeling palpitations today despite being in atrial flutter with a heart rate in the 130s.  Still having chest pain on the left side with deep inspiration, no chest wall tenderness, has never had this before Had some concerns about the TEE cardioversion  Inpatient Medications    Scheduled Meds: . sodium chloride   Intravenous Once  . sodium chloride   Intravenous Once  . anastrozole  1 mg Oral Daily  . apixaban  2.5 mg Oral Q12H  . celecoxib  200 mg Oral BID  . Chlorhexidine Gluconate Cloth  6 each Topical Q0600  . cholecalciferol  1,000 Units Oral Daily  . docusate sodium  100 mg Oral BID  . furosemide  30 mg Oral Daily  . gabapentin  300 mg Oral TID  . mouth rinse  15 mL Mouth Rinse BID  . metoprolol tartrate  50 mg Oral BID  . pantoprazole  40 mg Oral Daily  . polyethylene glycol  17 g Oral Once per day on Mon Tue Wed Thu Fri Sat  . sertraline  25 mg Oral Daily  . temazepam  15 mg Oral QHS   Continuous Infusions: . sodium chloride Stopped (02/06/19 0023)  . dextrose 5 % and 0.45 % NaCl with KCl 20 mEq/L 30 mL/hr at 02/07/19 0555  . methocarbamol (ROBAXIN) IV 500 mg (02/05/19 1734)   PRN Meds: acetaminophen, alum & mag hydroxide-simeth, benzonatate, bisacodyl, diphenhydrAMINE, HYDROmorphone (DILAUDID) injection, menthol-cetylpyridinium **OR** phenol, methocarbamol **OR** methocarbamol (ROBAXIN) IV, metoCLOPramide **OR** metoCLOPramide (REGLAN) injection, ondansetron **OR** ondansetron (ZOFRAN) IV, oxyCODONE, polyethylene glycol, sodium phosphate   Vital Signs    Vitals:   02/11/19 1558 02/11/19 2051 02/12/19 0453 02/12/19 0753  BP: 94/65 (!) 86/63 (!) 87/51 97/72  Pulse: (!) 130 (!) 124 (!) 124 (!) 123  Resp: 18 18 16 18   Temp: 98.3 F (36.8 C) 98 F (36.7 C) 98.7 F (37.1 C) (!) 97.5 F (36.4 C)  TempSrc: Oral  Oral  Oral  SpO2: 94% 96% 93% 96%  Weight:      Height:        Intake/Output Summary (Last 24 hours) at 02/12/2019 [id number] Last data filed at 02/11/2019 1500 Gross per 24 hour  Intake 240 ml  Output -  Net 240 ml   Last 3 Weights 02/06/2019 02/05/2019 01/30/2019  Weight (lbs) 116 lb 13.5 oz 117 lb 1 oz 116 lb  Weight (kg) 53 kg 53.1 kg 52.617 kg      Telemetry    Atrial flutter with rapid ventricular rate, steady in the 130s- Personally Reviewed  ECG    Atrial flutter, RVR, heart rate 123- Personally Reviewed  Physical Exam   General: Well developed, well nourished, female in no acute distress Head: Eyes PERRLA, Head normocephalic and atraumatic Lungs: Decreased breath sounds bases with a few rales on the left Heart: Irregular rate and rhythm S1 S2, without rub or gallop. No murmur. 4/4 extremity pulses are 2+ & equal. JVD 9 cm. Abdomen: Bowel sounds are present, abdomen soft and non-tender without masses or  hernias noted. Msk: Normal strength and tone for age. Extremities: No clubbing, cyanosis or edema.    Skin:  No rashes or lesions noted. Neuro: Alert and oriented X 3. Psych:  Good affect, responds appropriately  Labs    High Sensitivity Troponin:   Recent Labs  Lab 02/06/19  [employer] <2      Chemistry Recent Labs  Lab 02/06/19 0157 02/07/19 0202 02/09/19 1108  NA 133* 132* 138  K 4.4 4.8 4.6  CL 102 105 106  CO2 22 21* 26  GLUCOSE 215* 165* 111*  BUN 20 30* 39*  CREATININE 0.64 1.01* 0.99  CALCIUM 8.0* 7.7* 8.3*  GFRNONAA >60 48* 49*  GFRAA >60 56* 57*  ANIONGAP 9 6 6      Hematology Recent Labs  Lab 02/07/19 0202 02/08/19 0254 02/12/19 0559  WBC 10.6* 7.5 7.1  RBC 2.79* 3.60* 3.65*  HGB 8.1* 10.6* 10.7*  HCT 25.9* 32.5* 34.0*  MCV 92.8 90.3 93.2  MCH 29.0 29.4 29.3  MCHC 31.3 32.6 31.5  RDW 14.6 14.6 14.9  PLT 126* 143* 211    Radiology    Ct Angio Chest Pe W Or Wo Contrast  Result Date: 02/11/2019 CLINICAL DATA:  Chest  pain and tachycardia. Pulmonary embolus suspected. EXAM: CT ANGIOGRAPHY CHEST WITH CONTRAST TECHNIQUE: Multidetector CT imaging of the chest was performed using the standard protocol during bolus administration of intravenous contrast. Multiplanar CT image reconstructions and MIPs were obtained to evaluate the vascular anatomy. CONTRAST:  24mL OMNIPAQUE IOHEXOL 350 MG/ML SOLN COMPARISON:  10/06/2015 FINDINGS: Cardiovascular: The heart size is normal. No substantial pericardial effusion. Coronary artery calcification is evident. Atherosclerotic calcification is noted in the wall of the thoracic aorta. Enlargement of the main pulmonary arteries suggests pulmonary arterial hypertension. No filling defect within the opacified pulmonary arteries to suggest the presence of an acute pulmonary embolus. Mediastinum/Nodes: No mediastinal lymphadenopathy. No evidence for gross hilar lymphadenopathy although assessment is limited by the lack of intravenous contrast on today's study. There is no axillary lymphadenopathy. Food/debris in the esophageal lumen suggests reflux or dysmotility. Lungs/Pleura: Biapical pleuroparenchymal scarring. Left base collapse/consolidation is associated with a tiny left pleural effusion. No pulmonary edema. No suspicious pulmonary nodule or mass. There is some minimal collapse/consolidation in the posterior lingula. Upper Abdomen: Unremarkable. Musculoskeletal: No worrisome lytic or sclerotic osseous abnormality. Review of the MIP images confirms the above findings. IMPRESSION: 1. No CT evidence for acute pulmonary embolus. 2. Enlargement of the main pulmonary arteries suggests pulmonary arterial hypertension. 3. Left base collapse/consolidation with tiny left pleural effusion. 4. Aortic Atherosclerosis (ICD10-I70.0). Electronically Signed   By: Misty Stanley M.D.   On: 02/11/2019 10:01    Cardiac Studies   Echocardiogram 02/09/2019 1. Left ventricular ejection fraction, by visual  estimation, is 55 to 60%. The left ventricle has normal function. There is no left ventricular hypertrophy. 2. Left ventricular diastolic parameters are indeterminate. 3. The left ventricle has no regional wall motion abnormalities. 4. Global right ventricle has normal systolic function.The right ventricular size is normal. No increase in right ventricular wall thickness. 5. Left atrial size was mildly dilated. 6. Right atrial size was mild-moderately dilated. 7. Moderate pericardial effusion. 8. The pericardial effusion is circumferential. 9. Mild mitral annular calcification. 10. The mitral valve is normal in structure. Mild mitral valve regurgitation. 11. The tricuspid valve is normal in structure. Tricuspid valve regurgitation is trivial. 12. The aortic valve is tricuspid. Aortic valve regurgitation is trivial. Mild aortic valve sclerosis without stenosis. 13. There is Mild calcification of the aortic valve. 14. There is Mild thickening of the aortic valve. 15. The pulmonic valve was grossly normal. Pulmonic valve regurgitation is not visualized. 16. Normal pulmonary artery systolic pressure. 17. The inferior vena cava is normal in size with greater than 50% respiratory variability, suggesting  right atrial pressure of 3 mmHg. 18. There is a moderate pericardial effusion, most apparent asjacent to RV, that appears to be mixed fluid and adherent material. There is no RA or RV collapse, but both mitral and tricuspid inflow velocities have significant respiratory variation. IVC is not enlarged beyond range of normal and does have some respiratory collapse. No clear cut tamponade, but would correlate clinically. Findings communicated to [facility] and Dr. Marlou Porch.  Patient Profile     85 y.o. female 6 days status post right hip surgery for fracture/pseudoarthrosis, and persistent atrial flutter with a rapid ventricular response, new complaints of pleuritic chest discomfort in the last  24 hours.  Assessment & Plan    1.  Pleuritic chest pain:  - possible PE>> negative CT -There is a left base consolidation with a tiny effusion seen on her CT, not sure if this could cause pleuritic chest pain -We will leave to IM  2.  Atrial flutter:  - rate control inadequate - plan TEE/DCCV, can get today with Dr. Stanford Breed in the afternoon - Per Dr Sallyanne Kuster:  Both transesophageal echocardiogram and direct-current cardioversion procedures have been fully reviewed with the patient and informed consent has been obtained. -She had some questions, they were answered  3.  Pericardial effusion:  -At the TEE today, can get a good look at the effusion -No obvious symptoms related to this, MD to advise if further evaluation/tap is needed  Otherwise, per IM     For questions or updates, please contact [place] Please consult [email or web address] for contact info under        Signed, Rosaria Ferries, PA-C  02/12/2019, 8:07 AM   As above, patient seen and examined.  She remains in atrial flutter with rapid ventricular response.  This is likely related to hyperadrenergic state associated with recent surgery.  Rate is difficult to control and blood pressure borderline.  Continue apixaban.  Plan for TEE guided cardioversion today.  I have personally reviewed her previous echocardiogram and think her pericardial effusion is small and does not need further evaluation at this time.  Kirk Ruths, MD

## 2019-02-12 NOTE — Interval H&P Note (Signed)
History and Physical Interval Note:  02/12/2019 1:41 PM  Glenda [name]  has presented today for surgery, with the diagnosis of afib.  The various methods of treatment have been discussed with the patient and family. After consideration of risks, benefits and other options for treatment, the patient has consented to  Procedure(s): TRANSESOPHAGEAL ECHOCARDIOGRAM (TEE) (N/A) CARDIOVERSION (N/A) as a surgical intervention.  The patient's history has been reviewed, patient examined, no change in status, stable for surgery.  I have reviewed the patient's chart and labs.  Questions were answered to the patient's satisfaction.     Kirk Ruths

## 2019-02-12 NOTE — Progress Notes (Signed)
PT Cancellation Note  Patient Details Name: Glenda [name] MRN: [id number] DOB: 1924-10-28   Cancelled Treatment:    Reason Eval/Treat Not Completed: Patient at procedure or test/unavailable--cardioversion @ [facility]. Procedure ended up being cancelled per notes. Will resume PT on tomorrow.   Weston Anna, PT Acute Rehabilitation Services Pager: [phone] Office: [phone]

## 2019-02-12 NOTE — Progress Notes (Signed)
PATIENT ID: Glenda [name]  MRN: [id number]  DOB/AGE:  December 18, 1924 / 85 y.o.  7 Days Post-Op Procedure(s) (LRB): RIGH TOTAL HIP ARTHROPLASTY ANTERIOR APPROACH AND REMOVAL OF SCREWS (Right)    PROGRESS NOTE Subjective: Patient is alert, oriented, no Nausea, no Vomiting, yes passing gas, . Taking PO well. Denies SOB, Chest or Calf Pain. Using Incentive Spirometer, PAS in place. Ambulate WBAT with pt walking 15 ft with therapy Patient reports pain as  mild .    Objective: Vital signs in last 24 hours: Vitals:   02/11/19 1558 02/11/19 2051 02/12/19 0453 02/12/19 0753  BP: 94/65 (!) 86/63 (!) 87/51 97/72  Pulse: (!) 130 (!) 124 (!) 124 (!) 123  Resp: 18 18 16 18   Temp: 98.3 F (36.8 C) 98 F (36.7 C) 98.7 F (37.1 C) (!) 97.5 F (36.4 C)  TempSrc: Oral  Oral Oral  SpO2: 94% 96% 93% 96%  Weight:      Height:          Intake/Output from previous day: I/O last 3 completed shifts: In: 980 [P.O.:480; I.V.:500] Out: -    Intake/Output this shift: No intake/output data recorded.   LABORATORY DATA: Recent Labs    02/09/19 1108 02/12/19 0559  WBC  --  7.1  HGB  --  10.7*  HCT  --  34.0*  PLT  --  211  NA 138  --   K 4.6  --   CL 106  --   CO2 26  --   BUN 39*  --   CREATININE 0.99  --   GLUCOSE 111*  --   CALCIUM 8.3*  --     Examination: Neurologically intact Neurovascular intact Sensation intact distally Intact pulses distally Dorsiflexion/Plantar flexion intact Incision: dressing C/D/I and no drainage No cellulitis present Compartment soft} XR AP&Lat of hip shows well placed\fixed THA  Assessment:   7 Days Post-Op Procedure(s) (LRB): RIGH TOTAL HIP ARTHROPLASTY ANTERIOR APPROACH AND REMOVAL OF SCREWS (Right)   ADDITIONAL DIAGNOSIS:  Expected Acute Blood Loss Anemia,  History of palpitations,Atrial fibrillation, aortic stenosis, long-term use of Eliquis, chronic pain syndrome, chronic diastolic heart failure Anticipated LOS equal to or greater than 2  midnights due to - Age 52 and older with one or more of the following:  - Obesity  - Expected need for hospital services (PT, OT, Nursing) required for safe  discharge  - Anticipated need for postoperative skilled nursing care or inpatient rehab  - Active co-morbidities: Cardiac Arrhythmia OR   - Unanticipated findings during/Post Surgery: Slow post-op progression: GI, pain control, mobility      Plan: PT/OT WBAT, THA  DVT Prophylaxis: SCDx72 hrs, Eliquis  DISCHARGE PLAN: Home  DISCHARGE NEEDS: HHPT, Walker and 3-in-1 comode seat   Pt is currently being followed by Cardiology and has a procedure planned for later today.  Will await clearance from cardiology for discharge.

## 2019-02-12 NOTE — Progress Notes (Signed)
PROGRESS NOTE    Glenda [name]  [id number] DOB: 1925/03/01 DOA: 02/05/2019 PCP: Marin Olp, MD   Brief Narrative:   Amiracle Rzonca is a 85 year old Caucasian female with past medical history remarkable for proximal atrial flutter, essential hypertension, mild aortic stenosis, chronic diastolic congestive heart failure, hyperlipidemia admitted under the orthopedic service for total hip placement.  Postoperative course was complicated with acute blood loss anemia, hypotension, with requirement of PRBC transfusion and vasopressors; and subsequently improved.    TRH was consulted for assistance with tachycardia with EKG notable for atrial flutter with rapid ventricular response.   Assessment & Plan:   Principal Problem:   Osteoarthritis of right hip Active Problems:   Status post total replacement of right hip   Hypotension due to hypovolemia  Atrial flutter with RVR TSH within normal limits.  Patient continues with rapid ventricular response this morning with heart rate in the 120s-130s.  Was plan to transfer to [facility] for plan TEE/DCCV today by cardiology, but subsequently spontaneously converted to NSR this afternoon, and procedure was canceled. --Cardiology following, appreciate assistance --Continue metoprolol tartrate 50 mg p.o. twice daily --Anticoagulation with Eliquis 2.5 mg p.o. twice daily  Pericardial effusion Echo from 02/09/2019 with EF 55-30% with moderate pericardial effusion, mixed fluid/adherent material, normal IVC and no signs of tamponade.  Reviewed echo by cardiology on 02/12/2019, Dr. Stanford Breed; no need for repeat limited TTE at this time.  Right hip femoral neck nonunion Patient underwent right total hip arthroplasty on 02/05/2019 by orthopedics, Dr. Mayer Camel. --Weightbearing as tolerated --Continue PT/OT efforts --On Eliquis for DVT prophylaxis  Acute postoperative blood loss anemia Hemoglobin admission 8.9, dropped to 8.1 on 02/07/2019.   Status post 1 units PRBCs 02/05/2019 and 2 units PRBC on 02/07/2019. --Hemoglobin 10.7 today, stable  Postoperative hypotension Patient initially required a short course of vasopressors in the stepdown unit postoperatively, likely secondary to postoperative blood loss anemia versus accelerated atrial flutter. --BP improved, 115/54 this afternoon.  Anxiety/depression: Continue sertraline 25 mg p.o. daily  DVT prophylaxis: Eliquis Code Status: Full code Family Communication: None present at bedside Disposition Plan: Per primary, orthopedics   Consultants:   Cardiology  Procedures:   Planned DCCV/TEE canceled as patient spontaneously converted to NSR on 01/12/2019  TTE 02/09/2019: IMPRESSIONS    1. Left ventricular ejection fraction, by visual estimation, is 55 to 60%. The left ventricle has normal function. There is no left ventricular hypertrophy.  2. Left ventricular diastolic parameters are indeterminate.  3. The left ventricle has no regional wall motion abnormalities.  4. Global right ventricle has normal systolic function.The right ventricular size is normal. No increase in right ventricular wall thickness.  5. Left atrial size was mildly dilated.  6. Right atrial size was mild-moderately dilated.  7. Moderate pericardial effusion.  8. The pericardial effusion is circumferential.  9. Mild mitral annular calcification. 10. The mitral valve is normal in structure. Mild mitral valve regurgitation. 11. The tricuspid valve is normal in structure. Tricuspid valve regurgitation is trivial. 12. The aortic valve is tricuspid. Aortic valve regurgitation is trivial. Mild aortic valve sclerosis without stenosis. 13. There is Mild calcification of the aortic valve. 14. There is Mild thickening of the aortic valve. 15. The pulmonic valve was grossly normal. Pulmonic valve regurgitation is not visualized. 16. Normal pulmonary artery systolic pressure. 17. The inferior vena cava is  normal in size with greater than 50% respiratory variability, suggesting right atrial pressure of 3 mmHg. 18. There is a moderate pericardial effusion,  most apparent asjacent to RV, that appears to be mixed fluid and adherent material. There is no RA or RV collapse, but both mitral and tricuspid inflow velocities have significant respiratory variation. IVC  is not enlarged beyond range of normal and does have some respiratory collapse. No clear cut tamponade, but would correlate clinically. Findings communicated to [facility] APP and Dr. Marlou Porch.   Antimicrobials:   Perioperative vancomycin   Subjective: Patient seen and examined bedside, resting comfortably.  Awaiting transfer to [facility] for planned TEE/DCCV, which was subsequently canceled as patient converted to NSR upon arrival.  Patient without any other complaints this morning.  Denies headache, no dizziness, no chest pain, no palpitations, no shortness of breath, no abdominal pain.  No acute events overnight per nurse staff.  Objective: Vitals:   02/12/19 1150 02/12/19 1240 02/12/19 1327 02/12/19 1624  BP: 105/81 105/73 (!) 114/51 (!) 115/54  Pulse: (!) 143 (!) 139 (!) 106 (!) 56  Resp: 16 16 18 18   Temp:  97.7 F (36.5 C) 98.1 F (36.7 C) (!) 97.5 F (36.4 C)  TempSrc:  Oral Oral Oral  SpO2: 97% 97% 95% 99%  Weight:   53 kg   Height:   5\' 1"  (1.549 m)     Intake/Output Summary (Last 24 hours) at 02/12/2019 1817 Last data filed at 02/12/2019 1234 Gross per 24 hour  Intake 179.69 ml  Output -  Net 179.69 ml   Filed Weights   02/05/19 2020 02/06/19 1521 02/12/19 1327  Weight: 53.1 kg 53 kg 53 kg    Examination:  General exam: Appears calm and comfortable  Respiratory system: Clear to auscultation. Respiratory effort normal. Cardiovascular system: S1 & S2 heard, RRR. No JVD, murmurs, rubs, gallops or clicks. No pedal edema. Gastrointestinal system: Abdomen is nondistended, soft and nontender. No organomegaly or  masses felt. Normal bowel sounds heard. Central nervous system: Alert and oriented. No focal neurological deficits. Extremities: Symmetric 5 x 5 power. Skin: No rashes, lesions or ulcers Psychiatry: Judgement and insight appear normal. Mood & affect appropriate.     Data Reviewed: I have personally reviewed following labs and imaging studies  CBC: Recent Labs  Lab 02/06/19 0157 02/07/19 0202 02/08/19 0254 02/12/19 0559  WBC 9.3 10.6* 7.5 7.1  HGB 9.6* 8.1* 10.6* 10.7*  HCT 30.2* 25.9* 32.5* 34.0*  MCV 90.7 92.8 90.3 93.2  PLT 164 126* 143* [id number]   Basic Metabolic Panel: Recent Labs  Lab 02/06/19 0157 02/07/19 0202 02/09/19 1108  NA 133* 132* 138  K 4.4 4.8 4.6  CL 102 105 106  CO2 22 21* 26  GLUCOSE 215* 165* 111*  BUN 20 30* 39*  CREATININE 0.64 1.01* 0.99  CALCIUM 8.0* 7.7* 8.3*  MG  --   --  2.0   GFR: Estimated Creatinine Clearance: 27.4 mL/min (by C-G formula based on SCr of 0.99 mg/dL). Liver Function Tests: No results for input(s): AST, ALT, ALKPHOS, BILITOT, PROT, ALBUMIN in the last 168 hours. No results for input(s): LIPASE, AMYLASE in the last 168 hours. No results for input(s): AMMONIA in the last 168 hours. Coagulation Profile: No results for input(s): INR, PROTIME in the last 168 hours. Cardiac Enzymes: No results for input(s): CKTOTAL, CKMB, CKMBINDEX, TROPONINI in the last 168 hours. BNP (last 3 results) No results for input(s): PROBNP in the last 8760 hours. HbA1C: No results for input(s): HGBA1C in the last 72 hours. CBG: No results for input(s): GLUCAP in the last 168 hours. Lipid Profile: No  results for input(s): CHOL, HDL, LDLCALC, TRIG, CHOLHDL, LDLDIRECT in the last 72 hours. Thyroid Function Tests: No results for input(s): TSH, T4TOTAL, FREET4, T3FREE, THYROIDAB in the last 72 hours. Anemia Panel: No results for input(s): VITAMINB12, FOLATE, FERRITIN, TIBC, IRON, RETICCTPCT in the last 72 hours. Sepsis Labs: No results for input(s):  PROCALCITON, LATICACIDVEN in the last 168 hours.  Recent Results (from the past 240 hour(s))  SARS CORONAVIRUS 2 (TAT 6-24 HRS) Nasopharyngeal Nasopharyngeal Swab     Status: None   Collection Time: 02/08/19  6:54 PM   Specimen: Nasopharyngeal Swab  Result Value Ref Range Status   SARS Coronavirus 2 NEGATIVE NEGATIVE Final    Comment: (NOTE) SARS-CoV-2 target nucleic acids are NOT DETECTED. The SARS-CoV-2 RNA is generally detectable in upper and lower respiratory specimens during the acute phase of infection. Negative results do not preclude SARS-CoV-2 infection, do not rule out co-infections with other pathogens, and should not be used as the sole basis for treatment or other patient management decisions. Negative results must be combined with clinical observations, patient history, and epidemiological information. The expected result is Negative. Fact Sheet for Patients: SugarRoll.be Fact Sheet for Healthcare Providers: https://www.woods-mathews.com/ This test is not yet approved or cleared by the [place] FDA and  has been authorized for detection and/or diagnosis of SARS-CoV-2 by FDA under an Emergency Use Authorization (EUA). This EUA will remain  in effect (meaning this test can be used) for the duration of the COVID-19 declaration under Section 56 4(b)(1) of the Act, 21 U.S.C. section 360bbb-3(b)(1), unless the authorization is terminated or revoked sooner. Performed at [facility], [place] [address], [place],  [id number]          Radiology Studies: Ct Angio Chest Pe W Or Wo Contrast  Result Date: 02/11/2019 CLINICAL DATA:  Chest pain and tachycardia. Pulmonary embolus suspected. EXAM: CT ANGIOGRAPHY CHEST WITH CONTRAST TECHNIQUE: Multidetector CT imaging of the chest was performed using the standard protocol during bolus administration of intravenous contrast. Multiplanar CT image reconstructions and MIPs were  obtained to evaluate the vascular anatomy. CONTRAST:  22mL OMNIPAQUE IOHEXOL 350 MG/ML SOLN COMPARISON:  10/06/2015 FINDINGS: Cardiovascular: The heart size is normal. No substantial pericardial effusion. Coronary artery calcification is evident. Atherosclerotic calcification is noted in the wall of the thoracic aorta. Enlargement of the main pulmonary arteries suggests pulmonary arterial hypertension. No filling defect within the opacified pulmonary arteries to suggest the presence of an acute pulmonary embolus. Mediastinum/Nodes: No mediastinal lymphadenopathy. No evidence for gross hilar lymphadenopathy although assessment is limited by the lack of intravenous contrast on today's study. There is no axillary lymphadenopathy. Food/debris in the esophageal lumen suggests reflux or dysmotility. Lungs/Pleura: Biapical pleuroparenchymal scarring. Left base collapse/consolidation is associated with a tiny left pleural effusion. No pulmonary edema. No suspicious pulmonary nodule or mass. There is some minimal collapse/consolidation in the posterior lingula. Upper Abdomen: Unremarkable. Musculoskeletal: No worrisome lytic or sclerotic osseous abnormality. Review of the MIP images confirms the above findings. IMPRESSION: 1. No CT evidence for acute pulmonary embolus. 2. Enlargement of the main pulmonary arteries suggests pulmonary arterial hypertension. 3. Left base collapse/consolidation with tiny left pleural effusion. 4. Aortic Atherosclerosis (ICD10-I70.0). Electronically Signed   By: Misty Stanley M.D.   On: 02/11/2019 10:01        Scheduled Meds: . sodium chloride   Intravenous Once  . sodium chloride   Intravenous Once  . anastrozole  1 mg Oral Daily  . apixaban  2.5 mg Oral  Q12H  . celecoxib  200 mg Oral BID  . Chlorhexidine Gluconate Cloth  6 each Topical Q0600  . cholecalciferol  1,000 Units Oral Daily  . docusate sodium  100 mg Oral BID  . furosemide  30 mg Oral Daily  . gabapentin  300 mg Oral  TID  . mouth rinse  15 mL Mouth Rinse BID  . metoprolol tartrate  50 mg Oral BID  . pantoprazole  40 mg Oral Daily  . polyethylene glycol  17 g Oral Once per day on Mon Tue Wed Thu Fri Sat  . sertraline  25 mg Oral Daily  . temazepam  15 mg Oral QHS   Continuous Infusions: . sodium chloride Stopped (02/06/19 0023)  . dextrose 5 % and 0.45 % NaCl with KCl 20 mEq/L 30 mL/hr at 02/07/19 0555  . methocarbamol (ROBAXIN) IV 500 mg (02/05/19 1734)     LOS: 7 days    Time spent: 34 minutes spent on chart review, discussion with nursing staff, consultants, updating family and interview/physical exam; more than 50% of that time was spent in counseling and/or coordination of care.    Urie Loughner J [place] DO [facility] Password [facility] 02/12/2019, 6:17 PM

## 2019-02-12 NOTE — Progress Notes (Signed)
Pt self converted to sinus rhythm prior to anesthesia administration. Cancelled procedure per Dr. Stanford Breed.

## 2019-02-12 NOTE — H&P (View-Only) (Signed)
Progress Note  Patient Name: Glenda [name] Date of Encounter: 02/12/2019  Primary Cardiologist: Kirk Ruths, MD   Subjective   Has not been feeling palpitations today despite being in atrial flutter with a heart rate in the 130s.  Still having chest pain on the left side with deep inspiration, no chest wall tenderness, has never had this before Had some concerns about the TEE cardioversion  Inpatient Medications    Scheduled Meds: . sodium chloride   Intravenous Once  . sodium chloride   Intravenous Once  . anastrozole  1 mg Oral Daily  . apixaban  2.5 mg Oral Q12H  . celecoxib  200 mg Oral BID  . Chlorhexidine Gluconate Cloth  6 each Topical Q0600  . cholecalciferol  1,000 Units Oral Daily  . docusate sodium  100 mg Oral BID  . furosemide  30 mg Oral Daily  . gabapentin  300 mg Oral TID  . mouth rinse  15 mL Mouth Rinse BID  . metoprolol tartrate  50 mg Oral BID  . pantoprazole  40 mg Oral Daily  . polyethylene glycol  17 g Oral Once per day on Mon Tue Wed Thu Fri Sat  . sertraline  25 mg Oral Daily  . temazepam  15 mg Oral QHS   Continuous Infusions: . sodium chloride Stopped (02/06/19 0023)  . dextrose 5 % and 0.45 % NaCl with KCl 20 mEq/L 30 mL/hr at 02/07/19 0555  . methocarbamol (ROBAXIN) IV 500 mg (02/05/19 1734)   PRN Meds: acetaminophen, alum & mag hydroxide-simeth, benzonatate, bisacodyl, diphenhydrAMINE, HYDROmorphone (DILAUDID) injection, menthol-cetylpyridinium **OR** phenol, methocarbamol **OR** methocarbamol (ROBAXIN) IV, metoCLOPramide **OR** metoCLOPramide (REGLAN) injection, ondansetron **OR** ondansetron (ZOFRAN) IV, oxyCODONE, polyethylene glycol, sodium phosphate   Vital Signs    Vitals:   02/11/19 1558 02/11/19 2051 02/12/19 0453 02/12/19 0753  BP: 94/65 (!) 86/63 (!) 87/51 97/72  Pulse: (!) 130 (!) 124 (!) 124 (!) 123  Resp: 18 18 16 18   Temp: 98.3 F (36.8 C) 98 F (36.7 C) 98.7 F (37.1 C) (!) 97.5 F (36.4 C)  TempSrc: Oral  Oral  Oral  SpO2: 94% 96% 93% 96%  Weight:      Height:        Intake/Output Summary (Last 24 hours) at 02/12/2019 [id number] Last data filed at 02/11/2019 1500 Gross per 24 hour  Intake 240 ml  Output -  Net 240 ml   Last 3 Weights 02/06/2019 02/05/2019 01/30/2019  Weight (lbs) 116 lb 13.5 oz 117 lb 1 oz 116 lb  Weight (kg) 53 kg 53.1 kg 52.617 kg      Telemetry    Atrial flutter with rapid ventricular rate, steady in the 130s- Personally Reviewed  ECG    Atrial flutter, RVR, heart rate 123- Personally Reviewed  Physical Exam   General: Well developed, well nourished, female in no acute distress Head: Eyes PERRLA, Head normocephalic and atraumatic Lungs: Decreased breath sounds bases with a few rales on the left Heart: Irregular rate and rhythm S1 S2, without rub or gallop. No murmur. 4/4 extremity pulses are 2+ & equal. JVD 9 cm. Abdomen: Bowel sounds are present, abdomen soft and non-tender without masses or  hernias noted. Msk: Normal strength and tone for age. Extremities: No clubbing, cyanosis or edema.    Skin:  No rashes or lesions noted. Neuro: Alert and oriented X 3. Psych:  Good affect, responds appropriately  Labs    High Sensitivity Troponin:   Recent Labs  Lab 02/06/19  [place] <2      Chemistry Recent Labs  Lab 02/06/19 0157 02/07/19 0202 02/09/19 1108  NA 133* 132* 138  K 4.4 4.8 4.6  CL 102 105 106  CO2 22 21* 26  GLUCOSE 215* 165* 111*  BUN 20 30* 39*  CREATININE 0.64 1.01* 0.99  CALCIUM 8.0* 7.7* 8.3*  GFRNONAA >60 48* 49*  GFRAA >60 56* 57*  ANIONGAP 9 6 6      Hematology Recent Labs  Lab 02/07/19 0202 02/08/19 0254 02/12/19 0559  WBC 10.6* 7.5 7.1  RBC 2.79* 3.60* 3.65*  HGB 8.1* 10.6* 10.7*  HCT 25.9* 32.5* 34.0*  MCV 92.8 90.3 93.2  MCH 29.0 29.4 29.3  MCHC 31.3 32.6 31.5  RDW 14.6 14.6 14.9  PLT 126* 143* 211    Radiology    Ct Angio Chest Pe W Or Wo Contrast  Result Date: 02/11/2019 CLINICAL DATA:  Chest  pain and tachycardia. Pulmonary embolus suspected. EXAM: CT ANGIOGRAPHY CHEST WITH CONTRAST TECHNIQUE: Multidetector CT imaging of the chest was performed using the standard protocol during bolus administration of intravenous contrast. Multiplanar CT image reconstructions and MIPs were obtained to evaluate the vascular anatomy. CONTRAST:  54mL OMNIPAQUE IOHEXOL 350 MG/ML SOLN COMPARISON:  10/06/2015 FINDINGS: Cardiovascular: The heart size is normal. No substantial pericardial effusion. Coronary artery calcification is evident. Atherosclerotic calcification is noted in the wall of the thoracic aorta. Enlargement of the main pulmonary arteries suggests pulmonary arterial hypertension. No filling defect within the opacified pulmonary arteries to suggest the presence of an acute pulmonary embolus. Mediastinum/Nodes: No mediastinal lymphadenopathy. No evidence for gross hilar lymphadenopathy although assessment is limited by the lack of intravenous contrast on today's study. There is no axillary lymphadenopathy. Food/debris in the esophageal lumen suggests reflux or dysmotility. Lungs/Pleura: Biapical pleuroparenchymal scarring. Left base collapse/consolidation is associated with a tiny left pleural effusion. No pulmonary edema. No suspicious pulmonary nodule or mass. There is some minimal collapse/consolidation in the posterior lingula. Upper Abdomen: Unremarkable. Musculoskeletal: No worrisome lytic or sclerotic osseous abnormality. Review of the MIP images confirms the above findings. IMPRESSION: 1. No CT evidence for acute pulmonary embolus. 2. Enlargement of the main pulmonary arteries suggests pulmonary arterial hypertension. 3. Left base collapse/consolidation with tiny left pleural effusion. 4. Aortic Atherosclerosis (ICD10-I70.0). Electronically Signed   By: Misty Stanley M.D.   On: 02/11/2019 10:01    Cardiac Studies   Echocardiogram 02/09/2019 1. Left ventricular ejection fraction, by visual  estimation, is 55 to 60%. The left ventricle has normal function. There is no left ventricular hypertrophy. 2. Left ventricular diastolic parameters are indeterminate. 3. The left ventricle has no regional wall motion abnormalities. 4. Global right ventricle has normal systolic function.The right ventricular size is normal. No increase in right ventricular wall thickness. 5. Left atrial size was mildly dilated. 6. Right atrial size was mild-moderately dilated. 7. Moderate pericardial effusion. 8. The pericardial effusion is circumferential. 9. Mild mitral annular calcification. 10. The mitral valve is normal in structure. Mild mitral valve regurgitation. 11. The tricuspid valve is normal in structure. Tricuspid valve regurgitation is trivial. 12. The aortic valve is tricuspid. Aortic valve regurgitation is trivial. Mild aortic valve sclerosis without stenosis. 13. There is Mild calcification of the aortic valve. 14. There is Mild thickening of the aortic valve. 15. The pulmonic valve was grossly normal. Pulmonic valve regurgitation is not visualized. 16. Normal pulmonary artery systolic pressure. 17. The inferior vena cava is normal in size with greater than 50% respiratory variability, suggesting  right atrial pressure of 3 mmHg. 18. There is a moderate pericardial effusion, most apparent asjacent to RV, that appears to be mixed fluid and adherent material. There is no RA or RV collapse, but both mitral and tricuspid inflow velocities have significant respiratory variation. IVC is not enlarged beyond range of normal and does have some respiratory collapse. No clear cut tamponade, but would correlate clinically. Findings communicated to [facility] APP and Dr. Marlou Porch.  Patient Profile     85 y.o. female 6 days status post right hip surgery for fracture/pseudoarthrosis, and persistent atrial flutter with a rapid ventricular response, new complaints of pleuritic chest discomfort in the last  24 hours.  Assessment & Plan    1.  Pleuritic chest pain:  - possible PE>> negative CT -There is a left base consolidation with a tiny effusion seen on her CT, not sure if this could cause pleuritic chest pain -We will leave to IM  2.  Atrial flutter:  - rate control inadequate - plan TEE/DCCV, can get today with Dr. Stanford Breed in the afternoon - Per Dr Sallyanne Kuster:  Both transesophageal echocardiogram and direct-current cardioversion procedures have been fully reviewed with the patient and informed consent has been obtained. -She had some questions, they were answered  3.  Pericardial effusion:  -At the TEE today, can get a good look at the effusion -No obvious symptoms related to this, MD to advise if further evaluation/tap is needed  Otherwise, per IM     For questions or updates, please contact [place] Please consult [email or web address] for contact info under        Signed, Rosaria Ferries, PA-C  02/12/2019, 8:07 AM   As above, patient seen and examined.  She remains in atrial flutter with rapid ventricular response.  This is likely related to hyperadrenergic state associated with recent surgery.  Rate is difficult to control and blood pressure borderline.  Continue apixaban.  Plan for TEE guided cardioversion today.  I have personally reviewed her previous echocardiogram and think her pericardial effusion is small and does not need further evaluation at this time.  Kirk Ruths, MD

## 2019-02-12 NOTE — Progress Notes (Signed)
Patient set up for [facility] to bring them to [facility] by 1200 for procedure today.

## 2019-02-12 NOTE — Care Management Important Message (Signed)
Important Message  Patient Details IM Letter given to Marney Doctor RN to present to the Patient Name: Glenda [name] MRN: [id number] Date of Birth: 09-16-1924   Medicare Important Message Given:  Yes     [name] 02/12/2019, 10:09 AM

## 2019-02-12 NOTE — Progress Notes (Signed)
    Transesophageal Echocardiogram Note  [name] [id number] 1924-09-11  Procedure: Transesophageal Echocardiogram Indications: atrial flutter  Procedure Details Consent: Obtained Pt presented for TEE/DCCV; was in sinus; procedure canceled Kirk Ruths

## 2019-02-13 DIAGNOSIS — Z9889 Other specified postprocedural states: Secondary | ICD-10-CM

## 2019-02-13 DIAGNOSIS — I4892 Unspecified atrial flutter: Secondary | ICD-10-CM

## 2019-02-13 DIAGNOSIS — I5032 Chronic diastolic (congestive) heart failure: Secondary | ICD-10-CM

## 2019-02-13 LAB — BASIC METABOLIC PANEL
Anion gap: 8 (ref 5–15)
BUN: 32 mg/dL — ABNORMAL HIGH (ref 8–23)
CO2: 25 mmol/L (ref 22–32)
Calcium: 8 mg/dL — ABNORMAL LOW (ref 8.9–10.3)
Chloride: 104 mmol/L (ref 98–111)
Creatinine, Ser: 0.9 mg/dL (ref 0.44–1.00)
GFR calc Af Amer: >60 mL/min (ref >60)
GFR calc non Af Amer: 56 mL/min — ABNORMAL LOW (ref >60)
Glucose, Bld: 100 mg/dL — ABNORMAL HIGH (ref 70–99)
Potassium: 4.3 mmol/L (ref 3.5–5.1)
Sodium: 137 mmol/L (ref 135–145)

## 2019-02-13 LAB — CBC
HCT: 31.7 % — ABNORMAL LOW (ref 36.0–46.0)
Hemoglobin: 9.9 g/dL — ABNORMAL LOW (ref 12.0–15.0)
MCH: 29.5 pg (ref 26.0–34.0)
MCHC: 31.2 g/dL (ref 30.0–36.0)
MCV: 94.3 fL (ref 80.0–100.0)
Platelets: 218 10*3/uL (ref 150–400)
RBC: 3.36 MIL/uL — ABNORMAL LOW (ref 3.87–5.11)
RDW: 14.8 % (ref 11.5–15.5)
WBC: 5.8 10*3/uL (ref 4.0–10.5)
nRBC: 0 % (ref 0.0–0.2)

## 2019-02-13 MED ORDER — METOPROLOL TARTRATE 25 MG PO TABS
25.0000 mg | ORAL_TABLET | Freq: Two times a day (BID) | ORAL | Status: DC
  Administered 2019-02-13: 25 mg via ORAL
  Filled 2019-02-13 (×2): qty 1

## 2019-02-13 NOTE — Progress Notes (Signed)
Physical Therapy Treatment Patient Details Name: Glenda [name] MRN: [id number] DOB: 05-08-1924 Today's Date: 02/13/2019    History of Present Illness Pt is 85 yo female s/p direct anterior R THA.  Pt with prior R femur fracture ealier this year.  PMH includes HTN, CHF, anxiety, see H and P for further history.  Post-op pt has had difficulty with hypotension. She received 2 units of PRBC on 02/07/19. Pt continues to have tachycardia  and was transferred to telemetry on 02/09/19 and followed by hospitalist and cardiologist with medication changes for A Flutter. .    PT Comments    Progressing with mobility.    Follow Up Recommendations  Follow surgeon's recommendation for DC plan and follow-up therapies;Supervision/Assistance - 24 hour     Equipment Recommendations  Rolling walker with 5" wheels(youth height)    Recommendations for Other Services       Precautions / Restrictions Precautions Precautions: Fall Restrictions Weight Bearing Restrictions: No RLE Weight Bearing: Weight bearing as tolerated    Mobility  Bed Mobility Overal bed mobility: Needs Assistance Bed Mobility: Supine to Sit     Supine to sit: Mod assist;HOB elevated     General bed mobility comments: Assist for bil LEs and trunk to upright. Increased time. Cues for technique.  Transfers Overall transfer level: Needs assistance Equipment used: Rolling walker (2 wheeled) Transfers: Sit to/from Stand Sit to Stand: Min guard         General transfer comment: Close guard for safety. VCs safety, hand placement.  Ambulation/Gait Ambulation/Gait assistance: Min assist Gait Distance (Feet): 35 Feet(x2) Assistive device: Rolling walker (2 wheeled) Gait Pattern/deviations: Decreased stride length;Step-through pattern;Trunk flexed     General Gait Details: Cues for safety, posture. Slow gait speed. Pt fatigues easily. Followed with recliner. Seated rest break needed between walks. HR not  elevated.   Stairs             Wheelchair Mobility    Modified Rankin (Stroke Patients Only)       Balance Overall balance assessment: Needs assistance         Standing balance support: Bilateral upper extremity supported Standing balance-Leahy Scale: Poor                              Cognition Arousal/Alertness: Awake/alert Behavior During Therapy: WFL for tasks assessed/performed Overall Cognitive Status: Within Functional Limits for tasks assessed                                 General Comments: Forgetfulness at times      Exercises Total Joint Exercises Ankle Circles/Pumps: AROM;Both;10 reps;Seated Quad Sets: AROM;Both;10 reps;Seated Short Arc Quad: AROM;Right;10 reps;Seated Heel Slides: AAROM;Right;10 reps;Seated Hip ABduction/ADduction: AAROM;Right;10 reps;Seated Long Arc Quad: AROM;Right;10 reps;Seated    General Comments        Pertinent Vitals/Pain Pain Assessment: Faces Faces Pain Scale: Hurts little more Pain Location: R hip Pain Descriptors / Indicators: Discomfort;Sore Pain Intervention(s): Monitored during session;Repositioned    Home Living                      Prior Function            PT Goals (current goals can now be found in the care plan section) Progress towards PT goals: Progressing toward goals    Frequency    7X/week  PT Plan Current plan remains appropriate    Co-evaluation              AM-PAC PT "6 Clicks" Mobility   Outcome Measure  Help needed turning from your back to your side while in a flat bed without using bedrails?: A Little Help needed moving from lying on your back to sitting on the side of a flat bed without using bedrails?: A Lot Help needed moving to and from a bed to a chair (including a wheelchair)?: A Little Help needed standing up from a chair using your arms (e.g., wheelchair or bedside chair)?: A Little Help needed to walk in hospital room?:  A Little Help needed climbing 3-5 steps with a railing? : A Lot 6 Click Score: 16    End of Session Equipment Utilized During Treatment: Gait belt Activity Tolerance: Patient limited by fatigue Patient left: in chair;with call bell/phone within reach;with chair alarm set   PT Visit Diagnosis: Difficulty in walking, not elsewhere classified (R26.2);Muscle weakness (generalized) (M62.81);History of falling (Z91.81);Unsteadiness on feet (R26.81)     Time: [id number] PT Time Calculation (min) (ACUTE ONLY): 22 min  Charges:  $Gait Training: 8-22 mins                        Weston Anna, PT Acute Rehabilitation Services Pager: [phone] Office: [phone]

## 2019-02-13 NOTE — Progress Notes (Addendum)
Progress Note  Patient Name: Glenda [name] Date of Encounter: 02/13/2019  Primary Cardiologist: Kirk Ruths, MD   Subjective   Did not realize she had converted until Dr Stanford Breed told her.  No SOB, does not feel any different today. Has been coughing some, especially during the night, no sweats or chills, no sputum  Inpatient Medications    Scheduled Meds: . sodium chloride   Intravenous Once  . sodium chloride   Intravenous Once  . anastrozole  1 mg Oral Daily  . apixaban  2.5 mg Oral Q12H  . celecoxib  200 mg Oral BID  . Chlorhexidine Gluconate Cloth  6 each Topical Q0600  . cholecalciferol  1,000 Units Oral Daily  . docusate sodium  100 mg Oral BID  . furosemide  30 mg Oral Daily  . gabapentin  300 mg Oral TID  . mouth rinse  15 mL Mouth Rinse BID  . metoprolol tartrate  50 mg Oral BID  . pantoprazole  40 mg Oral Daily  . polyethylene glycol  17 g Oral Once per day on Mon Tue Wed Thu Fri Sat  . sertraline  25 mg Oral Daily  . temazepam  15 mg Oral QHS   Continuous Infusions: . sodium chloride Stopped (02/06/19 0023)  . dextrose 5 % and 0.45 % NaCl with KCl 20 mEq/L 30 mL/hr at 02/07/19 0555  . methocarbamol (ROBAXIN) IV 500 mg (02/05/19 1734)   PRN Meds: acetaminophen, alum & mag hydroxide-simeth, benzonatate, bisacodyl, diphenhydrAMINE, HYDROmorphone (DILAUDID) injection, menthol-cetylpyridinium **OR** phenol, methocarbamol **OR** methocarbamol (ROBAXIN) IV, metoCLOPramide **OR** metoCLOPramide (REGLAN) injection, ondansetron **OR** ondansetron (ZOFRAN) IV, oxyCODONE, polyethylene glycol, sodium phosphate   Vital Signs    Vitals:   02/12/19 1327 02/12/19 1624 02/12/19 2046 02/13/19 0115  BP: (!) 114/51 (!) 115/54 100/71 116/63  Pulse: (!) 106 (!) 56 64 (!) 56  Resp: 18 18 17 16   Temp: 98.1 F (36.7 C) (!) 97.5 F (36.4 C) 98 F (36.7 C) 98.1 F (36.7 C)  TempSrc: Oral Oral Oral Oral  SpO2: 95% 99% 97% 96%  Weight: 53 kg     Height: 5\' 1"  (1.549 m)        Intake/Output Summary (Last 24 hours) at 02/13/2019 0748 Last data filed at 02/12/2019 2200 Gross per 24 hour  Intake 419.69 ml  Output -  Net 419.69 ml   Last 3 Weights 02/12/2019 02/06/2019 02/05/2019  Weight (lbs) 116 lb 13.5 oz 116 lb 13.5 oz 117 lb 1 oz  Weight (kg) 53 kg 53 kg 53.1 kg      Telemetry    Converted to SR yesterday, SR, S brady since then, HR generally 50s HR read as 40s at times 2nd PACs w/ compensatory pause- Personally Reviewed  ECG    11/23 ECG Atrial flutter, RVR, heart rate 123- Personally Reviewed  Physical Exam   General: Well developed, well nourished, female in no acute distress Head: Eyes PERRLA, Head normocephalic and atraumatic Lungs: clear bilaterally to auscultation, except for a few rales bases, wheezes when she coughs Heart: HRRR S1 S2, without rub or gallop. Soft murmur. 4/4 extremity pulses are 2+ & equal. No JVD. Abdomen: Bowel sounds are present, abdomen soft and non-tender without masses or  hernias noted. Msk: Normal strength and tone for age. Extremities: No clubbing, cyanosis or edema. R leg appears larger than L, but no edema   Skin:  No rashes or lesions noted. Neuro: Alert and oriented X 3. Psych:  Good affect, responds appropriately  Labs    High Sensitivity Troponin:   Recent Labs  Lab 02/06/19 1241  TROPONINIHS <2      Chemistry Recent Labs  Lab 02/07/19 0202 02/09/19 1108 02/13/19 0545  NA 132* 138 137  K 4.8 4.6 4.3  CL 105 106 104  CO2 21* 26 25  GLUCOSE 165* 111* 100*  BUN 30* 39* 32*  CREATININE 1.01* 0.99 0.90  CALCIUM 7.7* 8.3* 8.0*  GFRNONAA 48* 49* 56*  GFRAA 56* 57* >60  ANIONGAP 6 6 8      Hematology Recent Labs  Lab 02/08/19 0254 02/12/19 0559 02/13/19 0545  WBC 7.5 7.1 5.8  RBC 3.60* 3.65* 3.36*  HGB 10.6* 10.7* 9.9*  HCT 32.5* 34.0* 31.7*  MCV 90.3 93.2 94.3  MCH 29.4 29.3 29.5  MCHC 32.6 31.5 31.2  RDW 14.6 14.9 14.8  PLT 143* 211 218    Radiology    Ct Angio  Chest Pe W Or Wo Contrast  Result Date: 02/11/2019 CLINICAL DATA:  Chest pain and tachycardia. Pulmonary embolus suspected. EXAM: CT ANGIOGRAPHY CHEST WITH CONTRAST TECHNIQUE: Multidetector CT imaging of the chest was performed using the standard protocol during bolus administration of intravenous contrast. Multiplanar CT image reconstructions and MIPs were obtained to evaluate the vascular anatomy. CONTRAST:  46mL OMNIPAQUE IOHEXOL 350 MG/ML SOLN COMPARISON:  10/06/2015 FINDINGS: Cardiovascular: The heart size is normal. No substantial pericardial effusion. Coronary artery calcification is evident. Atherosclerotic calcification is noted in the wall of the thoracic aorta. Enlargement of the main pulmonary arteries suggests pulmonary arterial hypertension. No filling defect within the opacified pulmonary arteries to suggest the presence of an acute pulmonary embolus. Mediastinum/Nodes: No mediastinal lymphadenopathy. No evidence for gross hilar lymphadenopathy although assessment is limited by the lack of intravenous contrast on today's study. There is no axillary lymphadenopathy. Food/debris in the esophageal lumen suggests reflux or dysmotility. Lungs/Pleura: Biapical pleuroparenchymal scarring. Left base collapse/consolidation is associated with a tiny left pleural effusion. No pulmonary edema. No suspicious pulmonary nodule or mass. There is some minimal collapse/consolidation in the posterior lingula. Upper Abdomen: Unremarkable. Musculoskeletal: No worrisome lytic or sclerotic osseous abnormality. Review of the MIP images confirms the above findings. IMPRESSION: 1. No CT evidence for acute pulmonary embolus. 2. Enlargement of the main pulmonary arteries suggests pulmonary arterial hypertension. 3. Left base collapse/consolidation with tiny left pleural effusion. 4. Aortic Atherosclerosis (ICD10-I70.0). Electronically Signed   By: Misty Stanley M.D.   On: 02/11/2019 10:01    Cardiac Studies    Echocardiogram 02/09/2019 1. Left ventricular ejection fraction, by visual estimation, is 55 to 60%. The left ventricle has normal function. There is no left ventricular hypertrophy. 2. Left ventricular diastolic parameters are indeterminate. 3. The left ventricle has no regional wall motion abnormalities. 4. Global right ventricle has normal systolic function.The right ventricular size is normal. No increase in right ventricular wall thickness. 5. Left atrial size was mildly dilated. 6. Right atrial size was mild-moderately dilated. 7. Moderate pericardial effusion. 8. The pericardial effusion is circumferential. 9. Mild mitral annular calcification. 10. The mitral valve is normal in structure. Mild mitral valve regurgitation. 11. The tricuspid valve is normal in structure. Tricuspid valve regurgitation is trivial. 12. The aortic valve is tricuspid. Aortic valve regurgitation is trivial. Mild aortic valve sclerosis without stenosis. 13. There is Mild calcification of the aortic valve. 14. There is Mild thickening of the aortic valve. 15. The pulmonic valve was grossly normal. Pulmonic valve regurgitation is not visualized. 16. Normal pulmonary artery systolic pressure. 17. The  inferior vena cava is normal in size with greater than 50% respiratory variability, suggesting right atrial pressure of 3 mmHg. 18. There is a moderate pericardial effusion, most apparent asjacent to RV, that appears to be mixed fluid and adherent material. There is no RA or RV collapse, but both mitral and tricuspid inflow velocities have significant respiratory variation. IVC is not enlarged beyond range of normal and does have some respiratory collapse. No clear cut tamponade, but would correlate clinically. Findings communicated to [facility] and Dr. Marlou Porch.  Patient Profile     85 y.o. female 6 days status post right hip surgery for fracture/pseudoarthrosis, and persistent atrial flutter with a rapid  ventricular response, new complaints of pleuritic chest discomfort in the last 24 hours.  Assessment & Plan    1.  Pleuritic chest pain:  - CT negative for PE - L base consolidation w/ tiny effusion seen on CT - per IM  2.  Atrial flutter:  - scheduled for TEE/DCCV, but spontaneously converted to SR>>procedure canceled - L atrium mildly dilated, R atrium mild-mod dilated - needs to follow HR at home, not always aware of the atrial flutter, but HR was always high when in it. - Suggested she get a BP cuff, ck BP/HR daily and when she feels palpitations - on metoprolol 50 mg bid, with bradycardia will decrease to 25 mg bid  3.  Pericardial effusion:  - Dr Stanford Breed reviewed the echo and does not feel the effusion is large - follow clinically  Otherwise, per IM     For questions or updates, please contact [place] Please consult [email or web address] for contact info under        Signed, Rosaria Ferries, PA-C  02/13/2019, 7:48 AM   As above, patient seen and examined.  She denies dyspnea or chest pain today.  She converted spontaneously to sinus rhythm yesterday.  She is somewhat bradycardic.  Decrease metoprolol to 25 mg twice daily.  Continue apixaban.  Patient can be discharged from a cardiac standpoint once she recovers from her recent hip surgery.  Follow-up with me approximately 3 months after discharge.  We will sign off.  Please call with questions.  Continue present cardiac medications.  Note her amlodipine and losartan have been held and her blood pressure appears to be controlled.  She can continue off of these medications if her blood pressure is controlled with metoprolol alone. Kirk Ruths, MD

## 2019-02-13 NOTE — Progress Notes (Signed)
PROGRESS NOTE    Glenda [name]  [id number] DOB: 1924/04/23 DOA: 02/05/2019 PCP: Marin Olp, MD   Brief Narrative:   Glenda [name] is a 85 year old Caucasian female with past medical history remarkable for proximal atrial flutter, essential hypertension, mild aortic stenosis, chronic diastolic congestive heart failure, hyperlipidemia admitted under the orthopedic service for total hip placement.  Postoperative course was complicated with acute blood loss anemia, hypotension, with requirement of PRBC transfusion and vasopressors; and subsequently improved.    TRH was consulted for assistance with tachycardia with EKG notable for atrial flutter with rapid ventricular response.   Assessment & Plan:   Principal Problem:   Osteoarthritis of right hip Active Problems:   Chronic diastolic HF (heart failure) (HCC)   Atrial flutter with rapid ventricular response (HCC)   History of cardioversion   Status post total replacement of right hip   Hypotension due to hypovolemia  Atrial flutter with RVR TSH within normal limits.  Patient continues with rapid ventricular response this morning with heart rate in the 120s-130s.  Was plan to transfer to [facility] for plan TEE/DCCV today by cardiology, but subsequently spontaneously converted to NSR on 11/23, and procedure was canceled. --Cardiology now signed off --Decreased metoprolol tartrate from 50 mg to 25mg  BID for bradycardia --continue anticoagulation with Eliquis 2.5 mg p.o. twice daily  Pericardial effusion Echo from 02/09/2019 with EF 55-30% with moderate pericardial effusion, mixed fluid/adherent material, normal IVC and no signs of tamponade.  Reviewed echo by cardiology on 02/12/2019, Dr. Stanford Breed; no need for repeat limited TTE at this time.  Right hip femoral neck nonunion Patient underwent right total hip arthroplasty on 02/05/2019 by orthopedics, Dr. Mayer Camel. --Weightbearing as tolerated --Continue PT/OT efforts --On  Eliquis for DVT prophylaxis  Acute postoperative blood loss anemia Hemoglobin admission 8.9, dropped to 8.1 on 02/07/2019.  Status post 1 units PRBCs 02/05/2019 and 2 units PRBC on 02/07/2019. --Hemoglobin 9.9 today, stable  Postoperative hypotension Patient initially required a short course of vasopressors in the stepdown unit postoperatively, likely secondary to postoperative blood loss anemia versus accelerated atrial flutter. --BP stable, 116/63 this afternoon. --Cardiology recommends discontinuing pain and losartan as blood pressure is well controlled currently on metoprolol alone  Anxiety/depression: Continue sertraline 25 mg p.o. daily  DVT prophylaxis: Eliquis Code Status: Full code Family Communication: None present at bedside Disposition Plan: Per primary, orthopedics   Consultants:   Cardiology - Dr. Stanford Breed, signed off 02/13/2019  Procedures:   Planned DCCV/TEE canceled as patient spontaneously converted to NSR on 01/12/2019  TTE 02/09/2019: IMPRESSIONS    1. Left ventricular ejection fraction, by visual estimation, is 55 to 60%. The left ventricle has normal function. There is no left ventricular hypertrophy.  2. Left ventricular diastolic parameters are indeterminate.  3. The left ventricle has no regional wall motion abnormalities.  4. Global right ventricle has normal systolic function.The right ventricular size is normal. No increase in right ventricular wall thickness.  5. Left atrial size was mildly dilated.  6. Right atrial size was mild-moderately dilated.  7. Moderate pericardial effusion.  8. The pericardial effusion is circumferential.  9. Mild mitral annular calcification. 10. The mitral valve is normal in structure. Mild mitral valve regurgitation. 11. The tricuspid valve is normal in structure. Tricuspid valve regurgitation is trivial. 12. The aortic valve is tricuspid. Aortic valve regurgitation is trivial. Mild aortic valve sclerosis without  stenosis. 13. There is Mild calcification of the aortic valve. 14. There is Mild thickening of the aortic valve.  15. The pulmonic valve was grossly normal. Pulmonic valve regurgitation is not visualized. 16. Normal pulmonary artery systolic pressure. 17. The inferior vena cava is normal in size with greater than 50% respiratory variability, suggesting right atrial pressure of 3 mmHg. 18. There is a moderate pericardial effusion, most apparent asjacent to RV, that appears to be mixed fluid and adherent material. There is no RA or RV collapse, but both mitral and tricuspid inflow velocities have significant respiratory variation. IVC  is not enlarged beyond range of normal and does have some respiratory collapse. No clear cut tamponade, but would correlate clinically. Findings communicated to [facility] and Dr. Marlou Porch.   Antimicrobials:   Perioperative vancomycin   Subjective: Patient seen and examined bedside, resting comfortably.  Continues in sinus bradycardia.  Seen by cardiology this morning with recommendations of decreased dose of metoprolol, continue off of amlodipine/losartan.  And follow-up with Dr. Stanford Breed 3 months after discharge.  Patient without any other complaints at this time, concerned about timing of her discharge, per orthopedics. Denies headache, no dizziness, no chest pain, no palpitations, no shortness of breath, no abdominal pain.  No acute events overnight per nurse staff.  Objective: Vitals:   02/12/19 1624 02/12/19 2046 02/13/19 0115 02/13/19 1400  BP: (!) 115/54 100/71 116/63 104/76  Pulse: (!) 56 64 (!) 56 (!) 53  Resp: 18 17 16 18   Temp: (!) 97.5 F (36.4 C) 98 F (36.7 C) 98.1 F (36.7 C) 98.1 F (36.7 C)  TempSrc: Oral Oral Oral Oral  SpO2: 99% 97% 96% 99%  Weight:      Height:        Intake/Output Summary (Last 24 hours) at 02/13/2019 1438 Last data filed at 02/13/2019 1000 Gross per 24 hour  Intake 360 ml  Output -  Net 360 ml   Filed  Weights   02/05/19 2020 02/06/19 1521 02/12/19 1327  Weight: 53.1 kg 53 kg 53 kg    Examination:  General exam: Appears calm and comfortable  Respiratory system: Clear to auscultation. Respiratory effort normal. Cardiovascular system: S1 & S2 heard, bradycardic, regular rhythm. No JVD, murmurs, rubs, gallops or clicks. No pedal edema. Gastrointestinal system: Abdomen is nondistended, soft and nontender. No organomegaly or masses felt. Normal bowel sounds heard. Central nervous system: Alert and oriented. No focal neurological deficits. Extremities: Symmetric 5 x 5 power. Skin: No rashes, lesions or ulcers Psychiatry: Judgement and insight appear normal. Mood & affect appropriate.     Data Reviewed: I have personally reviewed following labs and imaging studies  CBC: Recent Labs  Lab 02/07/19 0202 02/08/19 0254 02/12/19 0559 02/13/19 0545  WBC 10.6* 7.5 7.1 5.8  HGB 8.1* 10.6* 10.7* 9.9*  HCT 25.9* 32.5* 34.0* 31.7*  MCV 92.8 90.3 93.2 94.3  PLT 126* 143* 211 [id number]   Basic Metabolic Panel: Recent Labs  Lab 02/07/19 0202 02/09/19 1108 02/13/19 0545  NA 132* 138 137  K 4.8 4.6 4.3  CL 105 106 104  CO2 21* 26 25  GLUCOSE 165* 111* 100*  BUN 30* 39* 32*  CREATININE 1.01* 0.99 0.90  CALCIUM 7.7* 8.3* 8.0*  MG  --  2.0  --    GFR: Estimated Creatinine Clearance: 30.1 mL/min (by C-G formula based on SCr of 0.9 mg/dL). Liver Function Tests: No results for input(s): AST, ALT, ALKPHOS, BILITOT, PROT, ALBUMIN in the last 168 hours. No results for input(s): LIPASE, AMYLASE in the last 168 hours. No results for input(s): AMMONIA in the last 168 hours.  Coagulation Profile: No results for input(s): INR, PROTIME in the last 168 hours. Cardiac Enzymes: No results for input(s): CKTOTAL, CKMB, CKMBINDEX, TROPONINI in the last 168 hours. BNP (last 3 results) No results for input(s): PROBNP in the last 8760 hours. HbA1C: No results for input(s): HGBA1C in the last 72 hours. CBG:  No results for input(s): GLUCAP in the last 168 hours. Lipid Profile: No results for input(s): CHOL, HDL, LDLCALC, TRIG, CHOLHDL, LDLDIRECT in the last 72 hours. Thyroid Function Tests: No results for input(s): TSH, T4TOTAL, FREET4, T3FREE, THYROIDAB in the last 72 hours. Anemia Panel: No results for input(s): VITAMINB12, FOLATE, FERRITIN, TIBC, IRON, RETICCTPCT in the last 72 hours. Sepsis Labs: No results for input(s): PROCALCITON, LATICACIDVEN in the last 168 hours.  Recent Results (from the past 240 hour(s))  SARS CORONAVIRUS 2 (TAT 6-24 HRS) Nasopharyngeal Nasopharyngeal Swab     Status: None   Collection Time: 02/08/19  6:54 PM   Specimen: Nasopharyngeal Swab  Result Value Ref Range Status   SARS Coronavirus 2 NEGATIVE NEGATIVE Final    Comment: (NOTE) SARS-CoV-2 target nucleic acids are NOT DETECTED. The SARS-CoV-2 RNA is generally detectable in upper and lower respiratory specimens during the acute phase of infection. Negative results do not preclude SARS-CoV-2 infection, do not rule out co-infections with other pathogens, and should not be used as the sole basis for treatment or other patient management decisions. Negative results must be combined with clinical observations, patient history, and epidemiological information. The expected result is Negative. Fact Sheet for Patients: SugarRoll.be Fact Sheet for Healthcare Providers: https://www.woods-mathews.com/ This test is not yet approved or cleared by the [place] FDA and  has been authorized for detection and/or diagnosis of SARS-CoV-2 by FDA under an Emergency Use Authorization (EUA). This EUA will remain  in effect (meaning this test can be used) for the duration of the COVID-19 declaration under Section 56 4(b)(1) of the Act, 21 U.S.C. section 360bbb-3(b)(1), unless the authorization is terminated or revoked sooner. Performed at [facility], [place] [address], [place], [place] [address]          Radiology Studies: No results found.      Scheduled Meds: . sodium chloride   Intravenous Once  . sodium chloride   Intravenous Once  . anastrozole  1 mg Oral Daily  . apixaban  2.5 mg Oral Q12H  . celecoxib  200 mg Oral BID  . cholecalciferol  1,000 Units Oral Daily  . docusate sodium  100 mg Oral BID  . furosemide  30 mg Oral Daily  . gabapentin  300 mg Oral TID  . mouth rinse  15 mL Mouth Rinse BID  . metoprolol tartrate  25 mg Oral BID  . pantoprazole  40 mg Oral Daily  . polyethylene glycol  17 g Oral Once per day on Mon Tue Wed Thu Fri Sat  . sertraline  25 mg Oral Daily  . temazepam  15 mg Oral QHS   Continuous Infusions: . sodium chloride Stopped (02/06/19 0023)  . dextrose 5 % and 0.45 % NaCl with KCl 20 mEq/L 30 mL/hr at 02/07/19 0555  . methocarbamol (ROBAXIN) IV 500 mg (02/05/19 1734)     LOS: 8 days    Time spent: 34 minutes spent on chart review, discussion with nursing staff, consultants, updating family and interview/physical exam; more than 50% of that time was spent in counseling and/or coordination of care.    Johnaton Sonneborn J [place] DO [facility] 02/13/2019, 2:38 PM

## 2019-02-13 NOTE — Care Plan (Signed)
Continuing to follow for discharge needs. Plan in place for patient to return to her assisted living when medically stable. Please contact me with needs   Ladell Heads, [place]

## 2019-02-13 NOTE — Progress Notes (Signed)
PATIENT ID: Glenda [name]  MRN: [id number]  DOB/AGE:  04/22/1924 / 85 y.o.  1 Day Post-Op Procedure(s): CANCELLED PROCEDURE    PROGRESS NOTE Subjective: Patient is alert, oriented, no Nausea, no Vomiting, yes passing gas, . Taking PO well. Denies SOB, Chest or Calf Pain. Using Incentive Spirometer, PAS in place. Ambulate Night nurse reports that the patient ambulated in the hallway without difficulty last evening Patient reports pain as  2/10  .    Objective: Vital signs in last 24 hours: Vitals:   02/12/19 1327 02/12/19 1624 02/12/19 2046 02/13/19 0115  BP: (Abnormal) 114/51 (Abnormal) 115/54 100/71 116/63  Pulse: (Abnormal) 106 (Abnormal) 56 64 (Abnormal) 56  Resp: 18 18 17 16   Temp: 98.1 F (36.7 C) (Abnormal) 97.5 F (36.4 C) 98 F (36.7 C) 98.1 F (36.7 C)  TempSrc: Oral Oral Oral Oral  SpO2: 95% 99% 97% 96%  Weight: 53 kg     Height: 5\' 1"  (1.549 m)         Intake/Output from previous day: I/O last 3 completed shifts: In: 659.7 [P.O.:480; IV Piggyback:179.7] Out: -    Intake/Output this shift: No intake/output data recorded.   LABORATORY DATA: Recent Labs    02/12/19 0559 02/13/19 0545  WBC 7.1 5.8  HGB 10.7* 9.9*  HCT 34.0* 31.7*  PLT 211 218  NA  --  137  K  --  4.3  CL  --  104  CO2  --  25  BUN  --  32*  CREATININE  --  0.90  GLUCOSE  --  100*  CALCIUM  --  8.0*    Examination: Neurologically intact ABD soft Neurovascular intact Sensation intact distally Intact pulses distally Dorsiflexion/Plantar flexion intact Incision: dressing C/D/I No cellulitis present Compartment soft} XR AP&Lat of hip shows well placed\fixed THA  Assessment:   1 Day Post-Op Procedure(s): CANCELLED PROCEDURE ADDITIONAL DIAGNOSIS:  Expected Acute Blood Loss Anemia, Status post blood transfusion on this admission, atrial flutter that spontaneously converted.   Plan: PT/OT WBAT, THA  DVT Prophylaxis: SCDx72 hrs, Eliquis 2.5mg  bid  DISCHARGE PLAN: Home,When  cleared by cardiology and his past physical therapy.  DISCHARGE NEEDS: HHPT, Walker and 3-in-1 comode seatPatient ID: Glenda [name], female   DOB: 08/28/1924, 85 y.o.   MRN: [id number]

## 2019-02-14 ENCOUNTER — Telehealth: Payer: Self-pay

## 2019-02-14 LAB — SARS CORONAVIRUS 2 (TAT 6-24 HRS): SARS Coronavirus 2: NEGATIVE

## 2019-02-14 MED ORDER — METOPROLOL TARTRATE 25 MG PO TABS: 12.5000 mg | ORAL_TABLET | Freq: Two times a day (BID) | ORAL | 0 refills | Status: DC

## 2019-02-14 MED ORDER — METOPROLOL TARTRATE 25 MG PO TABS
12.5000 mg | ORAL_TABLET | Freq: Two times a day (BID) | ORAL | Status: DC
  Administered 2019-02-14: 12.5 mg via ORAL
  Filled 2019-02-14: qty 1

## 2019-02-14 NOTE — Progress Notes (Signed)
PATIENT ID: Glenda [name]  MRN: [id number]  DOB/AGE:  1924/11/08 / 85 y.o.  2 Days Post-Op Procedure(s): CANCELLED PROCEDURE    PROGRESS NOTE Subjective: Patient is alert, oriented, no Nausea, no Vomiting, yes passing gas, . Taking PO well. Denies SOB, Chest or Calf Pain. Using Incentive Spirometer, PAS in place. Ambulate WBAT with pt walking 35 ft x 2  Patient reports pain as  mild .    Objective: Vital signs in last 24 hours: Vitals:   02/13/19 0115 02/13/19 1400 02/13/19 2042 02/14/19 0532  BP: 116/63 104/76 93/61 (!) 103/59  Pulse: (!) 56 (!) 53 (!) 53 (!) 50  Resp: 16 18 16 16   Temp: 98.1 F (36.7 C) 98.1 F (36.7 C) 97.8 F (36.6 C) (!) 97.5 F (36.4 C)  TempSrc: Oral Oral Oral Oral  SpO2: 96% 99% 98% 94%  Weight:      Height:          Intake/Output from previous day: I/O last 3 completed shifts: In: 360 [P.O.:360] Out: -    Intake/Output this shift: No intake/output data recorded.   LABORATORY DATA: Recent Labs    02/12/19 0559 02/13/19 0545  WBC 7.1 5.8  HGB 10.7* 9.9*  HCT 34.0* 31.7*  PLT 211 218  NA  --  137  K  --  4.3  CL  --  104  CO2  --  25  BUN  --  32*  CREATININE  --  0.90  GLUCOSE  --  100*  CALCIUM  --  8.0*    Examination: Neurologically intact Neurovascular intact Sensation intact distally Intact pulses distally Dorsiflexion/Plantar flexion intact Incision: dressing C/D/I No cellulitis present Compartment soft} XR AP&Lat of hip shows well placed\fixed THA  Assessment:   2 Days Post-Op Procedure(s): CANCELLED PROCEDURE ADDITIONAL DIAGNOSIS:  Expected Acute Blood Loss Anemia, Cardiac Arrythmia Afib Anticipated LOS equal to or greater than 2 midnights due to - Age 59 and older with one or more of the following:  - Obesity  - Expected need for hospital services (PT, OT, Nursing) required for safe  discharge  - Anticipated need for postoperative skilled nursing care or inpatient rehab  - Active co-morbidities: Status post  blood transfusion on this admission, atrial flutter that spontaneously converted. OR   - Unanticipated findings during/Post Surgery: Status post blood transfusion on this admission, atrial flutter that spontaneously converted.      Plan: PT/OT WBAT, THA  DVT Prophylaxis: SCDx72 hrs, Eliquis 2.5 mg BID  DISCHARGE PLAN: Home, today if cleared by medicine  DISCHARGE NEEDS: HHPT, Walker and 3-in-1 comode seat

## 2019-02-14 NOTE — Discharge Summary (Addendum)
Patient ID: Glenda [name] MRN: [id number] DOB/AGE: [birth year] 85 y.o.  Admit date: 02/05/2019 Discharge date: 02/14/2019  Admission Diagnoses:  Principal Problem:   Osteoarthritis of right hip Active Problems:   Chronic diastolic HF (heart failure) (HCC)   Atrial flutter with rapid ventricular response (HCC)   History of cardioversion   Status post total replacement of right hip   Hypotension due to hypovolemia   Discharge Diagnoses:  Same  Past Medical History:  Diagnosis Date  . Aortic stenosis    Echo 02/2011 showing mild AS with normal LV systolic function  . Arthritis    "lower back" (10/09/2015)  . Atrial flutter with rapid ventricular response ([place]) 08/19/2016   s/p successful DCCV on 08/20/16, continue eliquis  . Chronic bronchitis ([place])    "off and on; several years" (10/09/2015)  . Chronic diastolic CHF (congestive heart failure) ([place])    a. 12/2015 Echo: EF 55-60%, mild AI/MR, mildly dil LA.  Marland Kitchen Chronic lower back pain   . Complication of anesthesia    "they had trouble waking me up after colon resection"  . Confusion    Originally listed as TIA-pt denies this hx on 10/09/2015 "it was the [place] I was taking; they had thought I was having a st originally roke"  . DDD (degenerative disc disease), lumbar    severe facet dz and adv DDD MRI L spine 2009  . Diverticulosis   . Dysrhythmia    A-Fib  . Femur fracture, right ([place]) 10/18/2018  . GERD (gastroesophageal reflux disease)   . Hiatal hernia    hx  . Hypercholesterolemia   . Hypertension   . Insomnia   . OA (osteoarthritis) of knee   . Paroxysmal atrial flutter ([place])    a. 09/2015 s/p TEE/DCCV;  b. CHA2DS2VASc = 5-->Eliquis.  . Peripheral edema   . PVCs (premature ventricular contractions)   . Sacral fracture ([place]) 11/06/2012  . Vertigo 11/19/2012    Surgeries: Procedure(s): CANCELLED PROCEDURE on 02/05/2019 - 02/12/2019   Consultants: Treatment Team:  Flora Lipps, MD [facility], [place],  MD  Discharged Condition: Improved  Hospital Course: Glenda [name] is an 85 y.o. female who was admitted 02/05/2019 for operative treatment ofOsteoarthritis of right hip. Patient has severe unremitting pain that affects sleep, daily activities, and work/hobbies. After pre-op clearance the patient was taken to the operating room on 02/05/2019 - 02/12/2019 and underwent  Procedure(s): CANCELLED PROCEDURE.    Patient was given perioperative antibiotics:  Anti-infectives (From admission, onward)   Start     Dose/Rate Route Frequency Ordered Stop   02/05/19 1200  vancomycin (VANCOCIN) IVPB 1000 mg/200 mL premix     1,000 mg 200 mL/hr over 60 Minutes Intravenous On call to O.R. 02/05/19 1151 02/05/19 1520   02/05/19 1154  vancomycin (VANCOCIN) 1-5 GM/200ML-% IVPB    Note to Pharmacy: Karsten Ro   : cabinet override      02/05/19 1154 02/05/19 1420       Patient was given sequential compression devices, early ambulation, and chemoprophylaxis to prevent DVT.  Patient benefited maximally from hospital stay and there were no complications.    Recent vital signs:  Patient Vitals for the past 24 hrs:  BP Temp Temp src Pulse Resp SpO2  02/14/19 0532 (!) 103/59 (!) 97.5 F (36.4 C) Oral (!) 50 16 94 %  02/13/19 2042 93/61 97.8 F (36.6 C) Oral (!) 53 16 98 %  02/13/19 1400 104/76 98.1 F (36.7 C) Oral (!) 53 18 99 %  Recent laboratory studies:  Recent Labs    02/12/19 0559 02/13/19 0545  WBC 7.1 5.8  HGB 10.7* 9.9*  HCT 34.0* 31.7*  PLT 211 218  NA  --  137  K  --  4.3  CL  --  104  CO2  --  25  BUN  --  32*  CREATININE  --  0.90  GLUCOSE  --  100*  CALCIUM  --  8.0*     Discharge Medications:   Allergies as of 02/14/2019      Reactions   Ace Inhibitors Other (See Comments)   Cough   Halcion [triazolam]    UNSPECIFIED REACTION    Pentazocine Lactate    UNSPECIFIED REACTION    Sulfa Drugs Cross Reactors    UNSPECIFIED REACTION    Trazodone And Nefazodone  Other (See Comments)   Per MAR   Amitriptyline Other (See Comments)   Hyperactivity   Avelox [moxifloxacin Hcl In Nacl] Other (See Comments)   dizziness   Penicillins Rash      Medication List    STOP taking these medications   acetaminophen 500 MG tablet Commonly known as: TYLENOL   amLODipine 5 MG tablet Commonly known as: NORVASC   HYDROcodone-acetaminophen 5-325 MG tablet Commonly known as: NORCO/VICODIN   losartan 100 MG tablet Commonly known as: COZAAR     TAKE these medications   anastrozole 1 MG tablet Commonly known as: ARIMIDEX Take 1 tablet (1 mg total) by mouth daily.   apixaban 2.5 MG Tabs tablet Commonly known as: Eliquis Take 1 tablet (2.5 mg total) by mouth 2 (two) times daily.   benzonatate 100 MG capsule Commonly known as: TESSALON Take 100 mg by mouth every 8 (eight) hours as needed for cough.   CEROVITE ADVANCED FORMULA PO Take 1 tablet by mouth daily. (0800)   Cholecalciferol 25 MCG (1000 UT) tablet Take 1,000 Units by mouth daily.   Cranberry 425 MG Caps Take 425 mg by mouth 2 (two) times daily.   docusate sodium 100 MG capsule Commonly known as: COLACE Take 100 mg by mouth 2 (two) times daily.   furosemide 20 MG tablet Commonly known as: LASIX Take 1.5 tablets (30 mg total) by mouth daily.   metoprolol tartrate 25 MG tablet Commonly known as: LOPRESSOR Take 0.5 tablets (12.5 mg total) by mouth 2 (two) times daily.   omeprazole 40 MG capsule Commonly known as: PRILOSEC Take 40 mg by mouth daily.   ondansetron 4 MG tablet Commonly known as: ZOFRAN Take 4 mg by mouth every 6 (six) hours as needed for nausea.   oxyCODONE-acetaminophen 5-325 MG tablet Commonly known as: PERCOCET/ROXICET Take 1 tablet by mouth every 4 (four) hours as needed for severe pain.   polyethylene glycol 17 g packet Commonly known as: MIRALAX / GLYCOLAX Take 17 g by mouth See admin instructions. Mix 17 grams in 8 ounces of juice/water and drink daily,  Monday through Saturday   sertraline 25 MG tablet Commonly known as: ZOLOFT TAKE (1) TABLET BY MOUTH ONCE DAILY. What changed:   how much to take  how to take this  when to take this   temazepam 15 MG capsule Commonly known as: RESTORIL TAKE (1) CAPSULE BY MOUTH AT BEDTIME. SHOULD SEPARATE FROM HYDROCODONE BY AT LEAST 6 HOURS. What changed: See the new instructions.   tiZANidine 2 MG tablet Commonly known as: ZANAFLEX Take 1 tablet (2 mg total) by mouth every 6 (six) hours as needed.  Durable Medical Equipment  (From admission, onward)         Start     Ordered   02/05/19 2053  DME Walker rolling  Once    Question:  Patient needs a walker to treat with the following condition  Answer:  Status post right hip replacement   02/05/19 2053   02/05/19 2053  DME 3 n 1  Once     02/05/19 2053           Discharge Care Instructions  (From admission, onward)         Start     Ordered   02/14/19 0000  Weight bearing as tolerated     02/14/19 0804          Diagnostic Studies: Ct Angio Chest Pe W Or Wo Contrast  Result Date: 02/11/2019 CLINICAL DATA:  Chest pain and tachycardia. Pulmonary embolus suspected. EXAM: CT ANGIOGRAPHY CHEST WITH CONTRAST TECHNIQUE: Multidetector CT imaging of the chest was performed using the standard protocol during bolus administration of intravenous contrast. Multiplanar CT image reconstructions and MIPs were obtained to evaluate the vascular anatomy. CONTRAST:  59mL OMNIPAQUE IOHEXOL 350 MG/ML SOLN COMPARISON:  10/06/2015 FINDINGS: Cardiovascular: The heart size is normal. No substantial pericardial effusion. Coronary artery calcification is evident. Atherosclerotic calcification is noted in the wall of the thoracic aorta. Enlargement of the main pulmonary arteries suggests pulmonary arterial hypertension. No filling defect within the opacified pulmonary arteries to suggest the presence of an acute pulmonary embolus.  Mediastinum/Nodes: No mediastinal lymphadenopathy. No evidence for gross hilar lymphadenopathy although assessment is limited by the lack of intravenous contrast on today's study. There is no axillary lymphadenopathy. Food/debris in the esophageal lumen suggests reflux or dysmotility. Lungs/Pleura: Biapical pleuroparenchymal scarring. Left base collapse/consolidation is associated with a tiny left pleural effusion. No pulmonary edema. No suspicious pulmonary nodule or mass. There is some minimal collapse/consolidation in the posterior lingula. Upper Abdomen: Unremarkable. Musculoskeletal: No worrisome lytic or sclerotic osseous abnormality. Review of the MIP images confirms the above findings. IMPRESSION: 1. No CT evidence for acute pulmonary embolus. 2. Enlargement of the main pulmonary arteries suggests pulmonary arterial hypertension. 3. Left base collapse/consolidation with tiny left pleural effusion. 4. Aortic Atherosclerosis (ICD10-I70.0). Electronically Signed   By: Misty Stanley M.D.   On: 02/11/2019 10:01   Dg C-arm 1-60 Min-no Report  Result Date: 02/05/2019 Fluoroscopy was utilized by the requesting physician.  No radiographic interpretation.   Dg Hip Operative Unilat W Or W/o Pelvis Right  Result Date: 02/05/2019 CLINICAL DATA:  Removal of hip pins EXAM: OPERATIVE right HIP (WITH PELVIS IF PERFORMED) 2 VIEWS TECHNIQUE: Fluoroscopic spot image(s) were submitted for interpretation post-operatively. COMPARISON:  10/19/2018 FINDINGS: Two low resolution intraoperative spot views of the right hip. Total fluoroscopy time was 40 seconds. Removal of threaded screws from the right femur. There is a right hip replacement with normal alignment. IMPRESSION: Intraoperative fluoroscopic assistance provided during right hip surgery Electronically Signed   By: Donavan Foil M.D.   On: 02/05/2019 17:07    Disposition: Discharge disposition: 01-Home or Self Care       Discharge Instructions    Call MD  / Call 911   Complete by: As directed    If you experience chest pain or shortness of breath, CALL 911 and be transported to the hospital emergency room.  If you develope a fever above 101 F, pus (white drainage) or increased drainage or redness at the wound, or calf pain, call your surgeon's office.  Constipation Prevention   Complete by: As directed    Drink plenty of fluids.  Prune juice may be helpful.  You may use a stool softener, such as Colace (over the counter) 100 mg twice a day.  Use MiraLax (over the counter) for constipation as needed.   Diet - low sodium heart healthy   Complete by: As directed    Driving restrictions   Complete by: As directed    No driving for 2 weeks   Increase activity slowly as tolerated   Complete by: As directed    Patient may shower   Complete by: As directed    You may shower without a dressing once there is no drainage.  Do not wash over the wound.  If drainage remains, cover wound with plastic wrap and then shower.   Weight bearing as tolerated   Complete by: As directed       Follow-up Information    Frederik Pear, MD. Go on 02/20/2019.   Specialty: Orthopedic Surgery Why: Your appointment is scheduled for 10:00.  Contact information: [place] [id number] [phone]            Signed: Joanell Rising 02/14/2019, 8:39 AM

## 2019-02-14 NOTE — Plan of Care (Signed)
  Problem: Clinical Measurements: Goal: Ability to maintain clinical measurements within normal limits will improve Outcome: Completed/Met Goal: Cardiovascular complication will be avoided Outcome: Completed/Met   Problem: Activity: Goal: Risk for activity intolerance will decrease Outcome: Completed/Met   Problem: Health Behavior/Discharge Planning: Goal: Ability to manage health-related needs will improve Outcome: Completed/Met   Problem: Clinical Measurements: Goal: Ability to maintain clinical measurements within normal limits will improve Outcome: Completed/Met Goal: Will remain free from infection Outcome: Completed/Met Goal: Diagnostic test results will improve Outcome: Completed/Met Goal: Respiratory complications will improve Outcome: Completed/Met Goal: Cardiovascular complication will be avoided Outcome: Completed/Met   Problem: Activity: Goal: Risk for activity intolerance will decrease Outcome: Completed/Met   Problem: Nutrition: Goal: Adequate nutrition will be maintained Outcome: Completed/Met   Problem: Coping: Goal: Level of anxiety will decrease Outcome: Completed/Met   Problem: Elimination: Goal: Will not experience complications related to bowel motility Outcome: Completed/Met Goal: Will not experience complications related to urinary retention Outcome: Completed/Met   Problem: Pain Managment: Goal: General experience of comfort will improve Outcome: Completed/Met   Problem: Safety: Goal: Ability to remain free from injury will improve Outcome: Completed/Met   Problem: Skin Integrity: Goal: Risk for impaired skin integrity will decrease Outcome: Completed/Met   Problem: Education: Goal: Knowledge of the prescribed therapeutic regimen will improve Outcome: Completed/Met Goal: Understanding of discharge needs will improve Outcome: Completed/Met   Problem: Activity: Goal: Ability to avoid complications of mobility impairment will  improve Outcome: Completed/Met Goal: Ability to tolerate increased activity will improve Outcome: Completed/Met   Problem: Clinical Measurements: Goal: Postoperative complications will be avoided or minimized Outcome: Completed/Met   Problem: Pain Management: Goal: Pain level will decrease with appropriate interventions Outcome: Completed/Met   Problem: Skin Integrity: Goal: Will show signs of wound healing Outcome: Completed/Met

## 2019-02-14 NOTE — Progress Notes (Signed)
Telemetry has been reviewed.  Patient transiently bradycardic at times.  Decrease metoprolol to 12.5 mg at discharge.  As outlined in note yesterday would not resume amlodipine or losartan as blood pressure has been borderline.  Follow blood pressure as an outpatient and adjust regimen as needed. Kirk Ruths, MD

## 2019-02-14 NOTE — Telephone Encounter (Signed)
Copied from [place] [id number]. Topic: General - Other >> Feb 14, 2019 12:19 PM Yvette Rack wrote: Reason for CRM: Dottie with [facility] stated pt will be discharged from the hospital today and they need Dr. Yong Channel to sign the DNR form. Cb# [phone]

## 2019-02-14 NOTE — Telephone Encounter (Signed)
I am happy to sign the DNR in either way if you can confirm with patient.  With that being said-she may want to do a hospital follow-up video visit so please offer this

## 2019-02-14 NOTE — Progress Notes (Signed)
PROGRESS NOTE    Glenda [name]  [id number] DOB: [birth year] DOA: 02/05/2019 PCP: Marin Olp, MD   Brief Narrative:   Glenda [name] is a 85 year old Caucasian female with past medical history remarkable for proximal atrial flutter, essential hypertension, mild aortic stenosis, chronic diastolic congestive heart failure, hyperlipidemia admitted under the orthopedic service for total hip placement.  Postoperative course was complicated with acute blood loss anemia, hypotension, with requirement of PRBC transfusion and vasopressors; and subsequently improved.    TRH was consulted for assistance with tachycardia with EKG notable for atrial flutter with rapid ventricular response.   Assessment & Plan:   Principal Problem:   Osteoarthritis of right hip Active Problems:   Chronic diastolic HF (heart failure) (HCC)   Atrial flutter with rapid ventricular response (HCC)   History of cardioversion   Status post total replacement of right hip   Hypotension due to hypovolemia  Atrial flutter with RVR TSH within normal limits.  Patient continues with rapid ventricular response this morning with heart rate in the 120s-130s.  Was plan to transfer to [facility] for plan TEE/DCCV today by cardiology, but subsequently spontaneously converted to NSR on 11/23, and procedure was canceled. --Cardiology now signed off --Decreased metoprolol tartrate from 25 mg to 12.5mg  BID for bradycardia --continue anticoagulation with Eliquis 2.5 mg p.o. twice daily  Pericardial effusion Echo from 02/09/2019 with EF 55-30% with moderate pericardial effusion, mixed fluid/adherent material, normal IVC and no signs of tamponade.  Reviewed echo by cardiology on 02/12/2019, Dr. Stanford Breed; no need for repeat limited TTE at this time.  Right hip femoral neck nonunion Patient underwent right total hip arthroplasty on 02/05/2019 by orthopedics, Dr. Mayer Camel. --Weightbearing as tolerated --Continue PT/OT efforts  --On Eliquis for DVT prophylaxis  Acute postoperative blood loss anemia Hemoglobin admission 8.9, dropped to 8.1 on 02/07/2019.  Status post 1 units PRBCs 02/05/2019 and 2 units PRBC on 02/07/2019. --Hemoglobin 9.9 today, stable  Postoperative hypotension Patient initially required a short course of vasopressors in the stepdown unit postoperatively, likely secondary to postoperative blood loss anemia versus accelerated atrial flutter. --BP stable, 116/63 this afternoon. --Cardiology recommends discontinuing amlodipine and losartan as blood pressure is well controlled currently on metoprolol alone  Anxiety/depression: Continue sertraline 25 mg p.o. daily  DVT prophylaxis: Eliquis Code Status: Full code Family Communication: None present at bedside Disposition Plan: Discharging back to ALF per primary, orthopedics today   Consultants:   Cardiology - Dr. Stanford Breed, signed off 02/13/2019  Procedures:   Planned DCCV/TEE canceled as patient spontaneously converted to NSR on 01/12/2019  TTE 02/09/2019: IMPRESSIONS    1. Left ventricular ejection fraction, by visual estimation, is 55 to 60%. The left ventricle has normal function. There is no left ventricular hypertrophy.  2. Left ventricular diastolic parameters are indeterminate.  3. The left ventricle has no regional wall motion abnormalities.  4. Global right ventricle has normal systolic function.The right ventricular size is normal. No increase in right ventricular wall thickness.  5. Left atrial size was mildly dilated.  6. Right atrial size was mild-moderately dilated.  7. Moderate pericardial effusion.  8. The pericardial effusion is circumferential.  9. Mild mitral annular calcification. 10. The mitral valve is normal in structure. Mild mitral valve regurgitation. 11. The tricuspid valve is normal in structure. Tricuspid valve regurgitation is trivial. 12. The aortic valve is tricuspid. Aortic valve regurgitation is  trivial. Mild aortic valve sclerosis without stenosis. 13. There is Mild calcification of the aortic valve. 14. There is Mild  thickening of the aortic valve. 15. The pulmonic valve was grossly normal. Pulmonic valve regurgitation is not visualized. 16. Normal pulmonary artery systolic pressure. 17. The inferior vena cava is normal in size with greater than 50% respiratory variability, suggesting right atrial pressure of 3 mmHg. 18. There is a moderate pericardial effusion, most apparent asjacent to RV, that appears to be mixed fluid and adherent material. There is no RA or RV collapse, but both mitral and tricuspid inflow velocities have significant respiratory variation. IVC  is not enlarged beyond range of normal and does have some respiratory collapse. No clear cut tamponade, but would correlate clinically. Findings communicated to L. [place] APP and Dr. Marlou Porch.   Antimicrobials:   Perioperative vancomycin   Subjective: Patient seen and examined bedside, resting comfortably.  Metoprolol dose reduced to 12.5mg  BID fpr asymptomatic sinus bradycardia.  Discontinued amlodipine/losartan.  Follow-up with Dr. Stanford Breed 3 months after discharge.  Patient without any other complaints at this time. Denies headache, no dizziness, no chest pain, no palpitations, no shortness of breath, no abdominal pain.  No acute events overnight per nurse staff.  Objective: Vitals:   02/13/19 0115 02/13/19 1400 02/13/19 2042 02/14/19 0532  BP: 116/63 104/76 93/61 (!) 103/59  Pulse: (!) 56 (!) 53 (!) 53 (!) 50  Resp: 16 18 16 16   Temp: 98.1 F (36.7 C) 98.1 F (36.7 C) 97.8 F (36.6 C) (!) 97.5 F (36.4 C)  TempSrc: Oral Oral Oral Oral  SpO2: 96% 99% 98% 94%  Weight:      Height:        Intake/Output Summary (Last 24 hours) at 02/14/2019 0838 Last data filed at 02/13/2019 1800 Gross per 24 hour  Intake 360 ml  Output -  Net 360 ml   Filed Weights   02/05/19 2020 02/06/19 1521 02/12/19 1327  Weight:  53.1 kg 53 kg 53 kg    Examination:  General exam: Appears calm and comfortable  Respiratory system: Clear to auscultation. Respiratory effort normal. Cardiovascular system: S1 & S2 heard, bradycardic, regular rhythm. No JVD, murmurs, rubs, gallops or clicks. No pedal edema. Gastrointestinal system: Abdomen is nondistended, soft and nontender. No organomegaly or masses felt. Normal bowel sounds heard. Central nervous system: Alert and oriented. No focal neurological deficits. Extremities: Symmetric 5 x 5 power. Skin: No rashes, lesions or ulcers Psychiatry: Judgement and insight appear normal. Mood & affect appropriate.     Data Reviewed: I have personally reviewed following labs and imaging studies  CBC: Recent Labs  Lab 02/08/19 0254 02/12/19 0559 02/13/19 0545  WBC 7.5 7.1 5.8  HGB 10.6* 10.7* 9.9*  HCT 32.5* 34.0* 31.7*  MCV 90.3 93.2 94.3  PLT 143* 211 [id number]   Basic Metabolic Panel: Recent Labs  Lab 02/09/19 1108 02/13/19 0545  NA 138 137  K 4.6 4.3  CL 106 104  CO2 26 25  GLUCOSE 111* 100*  BUN 39* 32*  CREATININE 0.99 0.90  CALCIUM 8.3* 8.0*  MG 2.0  --    GFR: Estimated Creatinine Clearance: 30.1 mL/min (by C-G formula based on SCr of 0.9 mg/dL). Liver Function Tests: No results for input(s): AST, ALT, ALKPHOS, BILITOT, PROT, ALBUMIN in the last 168 hours. No results for input(s): LIPASE, AMYLASE in the last 168 hours. No results for input(s): AMMONIA in the last 168 hours. Coagulation Profile: No results for input(s): INR, PROTIME in the last 168 hours. Cardiac Enzymes: No results for input(s): CKTOTAL, CKMB, CKMBINDEX, TROPONINI in the last 168 hours. BNP (last 3  results) No results for input(s): PROBNP in the last 8760 hours. HbA1C: No results for input(s): HGBA1C in the last 72 hours. CBG: No results for input(s): GLUCAP in the last 168 hours. Lipid Profile: No results for input(s): CHOL, HDL, LDLCALC, TRIG, CHOLHDL, LDLDIRECT in the last 72  hours. Thyroid Function Tests: No results for input(s): TSH, T4TOTAL, FREET4, T3FREE, THYROIDAB in the last 72 hours. Anemia Panel: No results for input(s): VITAMINB12, FOLATE, FERRITIN, TIBC, IRON, RETICCTPCT in the last 72 hours. Sepsis Labs: No results for input(s): PROCALCITON, LATICACIDVEN in the last 168 hours.  Recent Results (from the past 240 hour(s))  SARS CORONAVIRUS 2 (TAT 6-24 HRS) Nasopharyngeal Nasopharyngeal Swab     Status: None   Collection Time: 02/08/19  6:54 PM   Specimen: Nasopharyngeal Swab  Result Value Ref Range Status   SARS Coronavirus 2 NEGATIVE NEGATIVE Final    Comment: (NOTE) SARS-CoV-2 target nucleic acids are NOT DETECTED. The SARS-CoV-2 RNA is generally detectable in upper and lower respiratory specimens during the acute phase of infection. Negative results do not preclude SARS-CoV-2 infection, do not rule out co-infections with other pathogens, and should not be used as the sole basis for treatment or other patient management decisions. Negative results must be combined with clinical observations, patient history, and epidemiological information. The expected result is Negative. Fact Sheet for Patients: SugarRoll.be Fact Sheet for Healthcare Providers: https://www.woods-mathews.com/ This test is not yet approved or cleared by the [place] FDA and  has been authorized for detection and/or diagnosis of SARS-CoV-2 by FDA under an Emergency Use Authorization (EUA). This EUA will remain  in effect (meaning this test can be used) for the duration of the COVID-19 declaration under Section 56 4(b)(1) of the Act, 21 U.S.C. section 360bbb-3(b)(1), unless the authorization is terminated or revoked sooner. Performed at [facility], [place] [address] [place], [place] [address]   SARS CORONAVIRUS 2 (TAT 6-24 HRS) Nasopharyngeal Nasopharyngeal Swab     Status: None   Collection Time: 02/13/19  5:17 PM    Specimen: Nasopharyngeal Swab  Result Value Ref Range Status   SARS Coronavirus 2 NEGATIVE NEGATIVE Final    Comment: (NOTE) SARS-CoV-2 target nucleic acids are NOT DETECTED. The SARS-CoV-2 RNA is generally detectable in upper and lower respiratory specimens during the acute phase of infection. Negative results do not preclude SARS-CoV-2 infection, do not rule out co-infections with other pathogens, and should not be used as the sole basis for treatment or other patient management decisions. Negative results must be combined with clinical observations, patient history, and epidemiological information. The expected result is Negative. Fact Sheet for Patients: SugarRoll.be Fact Sheet for Healthcare Providers: https://www.woods-mathews.com/ This test is not yet approved or cleared by the [place] FDA and  has been authorized for detection and/or diagnosis of SARS-CoV-2 by FDA under an Emergency Use Authorization (EUA). This EUA will remain  in effect (meaning this test can be used) for the duration of the COVID-19 declaration under Section 56 4(b)(1) of the Act, 21 U.S.C. section 360bbb-3(b)(1), unless the authorization is terminated or revoked sooner. Performed at [facility], [place] [address] [place], [place] [address]          Radiology Studies: No results found.      Scheduled Meds: . sodium chloride   Intravenous Once  . sodium chloride   Intravenous Once  . anastrozole  1 mg Oral Daily  . apixaban  2.5 mg Oral Q12H  . celecoxib  200 mg  Oral BID  . cholecalciferol  1,000 Units Oral Daily  . docusate sodium  100 mg Oral BID  . furosemide  30 mg Oral Daily  . gabapentin  300 mg Oral TID  . mouth rinse  15 mL Mouth Rinse BID  . metoprolol tartrate  12.5 mg Oral BID  . pantoprazole  40 mg Oral Daily  . polyethylene glycol  17 g Oral Once per day on Mon Tue Wed Thu Fri Sat  . sertraline  25 mg Oral Daily  .  temazepam  15 mg Oral QHS   Continuous Infusions: . sodium chloride Stopped (02/06/19 0023)  . dextrose 5 % and 0.45 % NaCl with KCl 20 mEq/L 30 mL/hr at 02/07/19 0555  . methocarbamol (ROBAXIN) IV 500 mg (02/05/19 1734)     LOS: 9 days    Time spent: 34 minutes spent on chart review, discussion with nursing staff, consultants, updating family and interview/physical exam; more than 50% of that time was spent in counseling and/or coordination of care.    Caius Silbernagel J [place] DO [facility] 02/14/2019, 8:38 AM

## 2019-02-14 NOTE — NC FL2 (Signed)
[place] LEVEL OF CARE SCREENING TOOL     IDENTIFICATION  Patient Name: Glenda [name] Birthdate: [birth year] Sex: female Admission Date (Current Location): 02/05/2019  [facility] and [place] Number:  Herbalist and Address:  [facility],  [place] [address] [place]      Provider Number: [id number]  Attending Physician Name and Address:  Frederik Pear, MD  Relative Name and Phone Number:       Current Level of Care: Hospital Recommended Level of Care: [place] Prior Approval Number:    Date Approved/Denied:   PASRR Number:    Discharge Plan: Other (Comment)(ALF [facility])    Current Diagnoses: Patient Active Problem List   Diagnosis Date Noted  . Hypotension due to hypovolemia   . Status post total replacement of right hip 02/05/2019  . Osteoarthritis of right hip 02/01/2019  . Hyponatremia 10/19/2018  . Femur fracture, right ([place]) 10/18/2018  . Hip fracture ([place]) 10/18/2018  . Anxiety 08/08/2018  . Abdominal hernia 05/19/2018  . Chronic pain syndrome 02/09/2018  . Chronic narcotic use 08/11/2017  . Malignant neoplasm of upper-outer quadrant of left breast in female, estrogen receptor positive ([place]) 06/27/2017  . History of cardioversion 08/20/2016  . Atrial flutter with rapid ventricular response ([place]) 08/19/2016  . Atrial flutter ([place])   . Shortness of breath 12/13/2014  . Palpitations 12/13/2014  . Constipation 11/06/2012  . Accelerated/malignant hypertension 09/14/2012  . Aortic stenosis, mild 07/11/2012  . Chronic diastolic HF (heart failure) ([place]) 07/11/2012  . Osteoarthritis 04/03/2012  . Anemia 06/10/2011  . Essential hypertension 03/25/2011  . Insomnia 09/15/2010  . Hypercholesterolemia 09/15/2010    Orientation RESPIRATION BLADDER Height & Weight     Self, Time, Situation, Place  Normal Continent Weight: 116 lb 13.5 oz (53 kg) Height:  5\' 1"  (154.9 cm)  BEHAVIORAL SYMPTOMS/MOOD  NEUROLOGICAL BOWEL NUTRITION STATUS      Continent Diet(heart healthy)  AMBULATORY STATUS COMMUNICATION OF NEEDS Skin   Limited Assist Verbally Normal                       Personal Care Assistance Level of Assistance  Bathing, Feeding, Dressing Bathing Assistance: Limited assistance Feeding assistance: Independent Dressing Assistance: Limited assistance     Functional Limitations Info  Sight, Hearing, Speech Sight Info: Adequate Hearing Info: Adequate Speech Info: Adequate    SPECIAL CARE FACTORS FREQUENCY  PT (By licensed PT), OT (By licensed OT)     PT Frequency: 3x wk OT Frequency: 3xwk            Contractures Contractures Info: Not present    Additional Factors Info  Code Status, Allergies, Psychotropic Code Status Info: full code Allergies Info: Ace Inhibitors Halcion (Triazolam) Pentazocine Lactate Sulfa Drugs Cross Reactors Trazodone And Nefazodone Amitriptyline Avelox (Moxifloxacin Hcl In Nacl) Penicillins Psychotropic Info: zoloft         Current Medications (02/14/2019):  This is the current hospital active medication list Current Facility-Administered Medications  Medication Dose Route Frequency Provider Last Rate Last Dose  . 0.9 %  sodium chloride infusion (Manually program via Guardrails IV Fluids)   Intravenous Once Leighton Parody, PA-C      . 0.9 %  sodium chloride infusion (Manually program via Guardrails IV Fluids)   Intravenous Once Leighton Parody, PA-C      . 0.9 %  sodium chloride infusion  10 mL/hr Intravenous Once Myrtie Soman, MD   Stopped at 02/06/19  0023  . acetaminophen (TYLENOL) tablet 325-650 mg  325-650 mg Oral Q6H PRN Frederik Pear, MD      . alum & mag hydroxide-simeth (MAALOX/MYLANTA) 200-200-20 MG/5ML suspension 30 mL  30 mL Oral Q4H PRN Frederik Pear, MD      . anastrozole (ARIMIDEX) tablet 1 mg  1 mg Oral Daily Frederik Pear, MD   1 mg at 02/13/19 1058  . apixaban (ELIQUIS) tablet 2.5 mg  2.5 mg Oral Q12H Frederik Pear, MD    2.5 mg at 02/13/19 2049  . benzonatate (TESSALON) capsule 100 mg  100 mg Oral Q8H PRN Frederik Pear, MD      . bisacodyl (DULCOLAX) EC tablet 5 mg  5 mg Oral Daily PRN Frederik Pear, MD      . celecoxib (CELEBREX) capsule 200 mg  200 mg Oral BID Frederik Pear, MD   200 mg at 02/13/19 2100  . cholecalciferol (VITAMIN D3) tablet 1,000 Units  1,000 Units Oral Daily Frederik Pear, MD   1,000 Units at 02/13/19 1058  . dextrose 5 % and 0.45 % NaCl with KCl 20 mEq/L infusion   Intravenous Continuous Frederik Pear, MD 30 mL/hr at 02/07/19 0555    . diphenhydrAMINE (BENADRYL) 12.5 MG/5ML elixir 12.5-25 mg  12.5-25 mg Oral Q4H PRN Frederik Pear, MD      . docusate sodium (COLACE) capsule 100 mg  100 mg Oral BID Frederik Pear, MD   100 mg at 02/13/19 2100  . furosemide (LASIX) tablet 30 mg  30 mg Oral Daily Ollis, Brandi L, NP   30 mg at 02/13/19 1058  . gabapentin (NEURONTIN) capsule 300 mg  300 mg Oral TID Frederik Pear, MD   300 mg at 02/13/19 2100  . HYDROmorphone (DILAUDID) injection 0.5-1 mg  0.5-1 mg Intravenous Q4H PRN Frederik Pear, MD      . MEDLINE mouth rinse  15 mL Mouth Rinse BID Frederik Pear, MD   15 mL at 02/13/19 2100  . menthol-cetylpyridinium (CEPACOL) lozenge 3 mg  1 lozenge Oral PRN Frederik Pear, MD       Or  . phenol (CHLORASEPTIC) mouth spray 1 spray  1 spray Mouth/Throat PRN Frederik Pear, MD      . methocarbamol (ROBAXIN) tablet 500 mg  500 mg Oral Q6H PRN Frederik Pear, MD       Or  . methocarbamol (ROBAXIN) 500 mg in dextrose 5 % 50 mL IVPB  500 mg Intravenous Q6H PRN Frederik Pear, MD 100 mL/hr at 02/05/19 1734 500 mg at 02/05/19 1734  . metoCLOPramide (REGLAN) tablet 5-10 mg  5-10 mg Oral Q8H PRN Frederik Pear, MD       Or  . metoCLOPramide (REGLAN) injection 5-10 mg  5-10 mg Intravenous Q8H PRN Frederik Pear, MD      . metoprolol tartrate (LOPRESSOR) tablet 12.5 mg  12.5 mg Oral BID Lelon Perla, MD      . ondansetron [facility]) tablet 4 mg  4 mg Oral Q6H PRN Frederik Pear, MD       Or   . ondansetron [facility]) injection 4 mg  4 mg Intravenous Q6H PRN Frederik Pear, MD      . oxyCODONE (Oxy IR/ROXICODONE) immediate release tablet 5 mg  5 mg Oral Q4H PRN Ollis, Brandi L, NP   5 mg at 02/13/19 1102  . pantoprazole (PROTONIX) EC tablet 40 mg  40 mg Oral Daily Frederik Pear, MD   40 mg at 02/13/19 1058  . polyethylene glycol (MIRALAX / GLYCOLAX) packet 17  g  17 g Oral Once per day on Mon Tue Wed Thu Fri Sat Frederik Pear, MD   17 g at 02/13/19 1102  . polyethylene glycol (MIRALAX / GLYCOLAX) packet 17 g  17 g Oral Daily PRN Frederik Pear, MD      . sertraline (ZOLOFT) tablet 25 mg  25 mg Oral Daily Frederik Pear, MD   25 mg at 02/13/19 1058  . sodium phosphate (FLEET) 7-19 GM/118ML enema 1 enema  1 enema Rectal Once PRN Frederik Pear, MD      . temazepam (RESTORIL) capsule 15 mg  15 mg Oral QHS Frederik Pear, MD   15 mg at 02/13/19 2100     Discharge Medications: Please see discharge summary for a list of discharge medications.  Relevant Imaging Results:  Relevant Lab Results:   Additional Information Burns Kentley Bly[place], [place]

## 2019-02-14 NOTE — Telephone Encounter (Signed)
Called and lm for pt tcb. 

## 2019-02-14 NOTE — TOC Transition Note (Signed)
Transition of Care [facility]) - CM/SW Discharge Note   Patient Details  Name: Glenda [name] MRN: [id number] Date of Birth: 10-Sep-1924  [facility] of Care [facility]) CM/SW Contact:  Wende Neighbors, LCSW Phone Number: 02/14/2019, 12:58 PM   Clinical Narrative:   Patient to discharge back to [facility] via [place]. Facility received FL2, summary and negative covid test prior to patients discharge. RN to please contact [phone] for report.    Final next level of care: Other (comment)([facility]) Barriers to Discharge: No Barriers Identified   Patient Goals and CMS Choice Patient states their goals for this hospitalization and ongoing recovery are:: therapy CMS Medicare.gov Compare Post Acute Care list provided to:: Patient Choice offered to / list presented to : Patient  Discharge Placement              Patient chooses bed at: [facility] [place] Patient to be transferred to facility by: ptar Name of family member notified: family/son Patient and family notified of of transfer: 02/14/19  Discharge Plan and Services In-house Referral: Clinical Social Work   Post Acute Care Choice: Home Health                    HH Arranged: PT [facility] Agency: (will have PT at the ALF from [facility])        Social Determinants of Health ([place]) Interventions     Readmission Risk Interventions Readmission Risk Prevention Plan 02/06/2019  Transportation Screening Complete  PCP or Specialist Appt within 3-5 Days Not Complete  Not Complete comments DC date unknown but established with providers  [place] or [place] Complete  Social Work Consult for [place] Planning/Counseling Complete  Palliative Care Screening Not Complete  Palliative Care Screening Not Complete Comments no desire  Medication Review Press photographer) Referral to Pharmacy  Some recent data might be hidden

## 2019-02-14 NOTE — Progress Notes (Signed)
Pt discharged back to [facility] ALF today per Dr. Mayer Camel. Pt's IV site D/C'd and WDL. Pt's VSS. Reprort called to Dottie at ALF. Verbalized understanding. Son, [name], made aware of discharge plans. Verbalized understanding. Pt left floor in stable condition accompanied by [facility] staff.

## 2019-02-14 NOTE — Telephone Encounter (Signed)
Does this require a visit with you in order to be signed?

## 2019-02-14 NOTE — Care Management Important Message (Signed)
Important Message  Patient Details IM Letter given to Rhea Pink SW to present to the Patient Name: Glenda [name] MRN: [id number] Date of Birth: [birth year]   Medicare Important Message Given:  Yes     [name] 02/14/2019, 11:48 AM

## 2019-02-14 NOTE — TOC Progression Note (Signed)
Transition of Care [facility]) - Progression Note    Patient Details  Name: Glenda [name] MRN: [id number] Date of Birth: 02-13-1925  Transition of Care [facility]) CM/SW [place], LCSW Phone Number: 02/14/2019, 10:41 AM  Clinical Narrative:   CSW following for support and discharge needs. Facility needing to lool over Fl2 and Summary prior to approving patients return. Information has been faxed over to [place] CSW awaiting there response     Expected Discharge Plan: Assisted Living Barriers to Discharge: Other (comment)(COVID-19 test pending)  Expected Discharge Plan and Services Expected Discharge Plan: Assisted Living In-house Referral: Clinical Social Work   Post Acute Care Choice: [place] arrangements for the past 2 months: [place] Expected Discharge Date: 02/14/19                         HH Arranged: PT [place] Agency: (will have PT at the ALF from [facility])         Social Determinants of Health ([place]) Interventions    Readmission Risk Interventions Readmission Risk Prevention Plan 02/06/2019  Transportation Screening Complete  PCP or Specialist Appt within 3-5 Days Not Complete  Not Complete comments DC date unknown but established with providers  [place] or [place] Complete  Social Work Consult for [place] Planning/Counseling Complete  Palliative Care Screening Not Complete  Palliative Care Screening Not Complete Comments no desire  Medication Review Press photographer) Referral to Pharmacy  Some recent data might be hidden

## 2019-02-14 NOTE — Progress Notes (Signed)
Physical Therapy Treatment Patient Details Name: Glenda [name] MRN: [id number] DOB: 08-Sep-1924 Today's Date: 02/14/2019    History of Present Illness Pt is 85 yo female s/p direct anterior R THA.  Pt with prior R femur fracture ealier this year.  PMH includes HTN, CHF, anxiety, see H and P for further history.  Post-op pt has had difficulty with hypotension. She received 2 units of PRBC on 02/07/19. Pt continues to have tachycardia  and was transferred to telemetry on 02/09/19 and followed by hospitalist and cardiologist with medication changes for A Flutter. .    PT Comments    Progressing slowly with mobility. Continue to recommend 24 hour supervision/assist.    Follow Up Recommendations  Follow surgeon's recommendation for DC plan and follow-up therapies;Supervision/Assistance - 24 hour     Equipment Recommendations  Rolling walker with 5" wheels(youth height)    Recommendations for Other Services       Precautions / Restrictions Precautions Precautions: Fall Restrictions Weight Bearing Restrictions: No RLE Weight Bearing: Weight bearing as tolerated    Mobility  Bed Mobility               General bed mobility comments: oob in recliner  Transfers Overall transfer level: Needs assistance Equipment used: Rolling walker (2 wheeled) Transfers: Sit to/from Stand Sit to Stand: Min guard         General transfer comment: Close guard for safety. VCs safety, hand placement.  Ambulation/Gait Ambulation/Gait assistance: Min assist Gait Distance (Feet): 40 Feet Assistive device: Rolling walker (2 wheeled) Gait Pattern/deviations: Decreased stride length;Step-through pattern;Trunk flexed     General Gait Details: Cues for safety, posture. Slow gait speed. Pt fatigues easily. Followed with recliner.   Stairs             Wheelchair Mobility    Modified Rankin (Stroke Patients Only)       Balance Overall balance assessment: Needs assistance          Standing balance support: Bilateral upper extremity supported Standing balance-Leahy Scale: Poor                              Cognition Arousal/Alertness: Awake/alert Behavior During Therapy: WFL for tasks assessed/performed Overall Cognitive Status: Within Functional Limits for tasks assessed                                        Exercises Total Joint Exercises Ankle Circles/Pumps: AROM;Both;10 reps;Seated Quad Sets: AROM;Both;10 reps;Seated Heel Slides: AAROM;Right;10 reps;Seated Hip ABduction/ADduction: AAROM;Right;10 reps;Seated    General Comments        Pertinent Vitals/Pain Pain Assessment: 0-10 Faces Pain Scale: Hurts little more Pain Location: R hip Pain Descriptors / Indicators: Discomfort;Sore Pain Intervention(s): Monitored during session;Repositioned    Home Living                      Prior Function            PT Goals (current goals can now be found in the care plan section) Progress towards PT goals: Progressing toward goals    Frequency    7X/week      PT Plan Current plan remains appropriate    Co-evaluation              AM-PAC PT "6 Clicks" Mobility   Outcome Measure  Help needed  turning from your back to your side while in a flat bed without using bedrails?: A Little Help needed moving from lying on your back to sitting on the side of a flat bed without using bedrails?: A Lot Help needed moving to and from a bed to a chair (including a wheelchair)?: A Little Help needed standing up from a chair using your arms (e.g., wheelchair or bedside chair)?: A Little Help needed to walk in hospital room?: A Little Help needed climbing 3-5 steps with a railing? : A Lot 6 Click Score: 16    End of Session Equipment Utilized During Treatment: Gait belt Activity Tolerance: Patient limited by fatigue Patient left: in chair;with call bell/phone within reach;with chair alarm set   PT Visit Diagnosis:  Difficulty in walking, not elsewhere classified (R26.2);Muscle weakness (generalized) (M62.81);History of falling (Z91.81);Unsteadiness on feet (R26.81)     Time: [id number] PT Time Calculation (min) (ACUTE ONLY): 19 min  Charges:  $Gait Training: 8-22 mins                       Weston Anna, PT Acute Rehabilitation Services Pager: [phone] Office: [phone]

## 2019-02-15 NOTE — Progress Notes (Signed)
02/15/2019 TOC CM faxed signed FL2 to [facility]. Stated they did not receive a signed copy. [place], [place] ED TOC CM [phone]

## 2019-02-16 ENCOUNTER — Ambulatory Visit (INDEPENDENT_AMBULATORY_CARE_PROVIDER_SITE_OTHER): Payer: Medicare Other | Admitting: Family Medicine

## 2019-02-16 ENCOUNTER — Other Ambulatory Visit: Payer: Self-pay

## 2019-02-16 DIAGNOSIS — R05 Cough: Secondary | ICD-10-CM

## 2019-02-16 DIAGNOSIS — R059 Cough, unspecified: Secondary | ICD-10-CM

## 2019-02-16 NOTE — Progress Notes (Signed)
This visit type was conducted due to national recommendations for restrictions regarding the COVID-19 pandemic in an effort to limit this patient's exposure and mitigate transmission in our community.   Virtual Visit via Video Note  I connected with Glenda [name] on 02/16/19 at  1:20 PM EST by a video enabled telemedicine application and verified that I am speaking with the correct person using two identifiers.  Location patient: home Location provider:work or home office Persons participating in the virtual visit: patient, provider  I discussed the limitations of evaluation and management by telemedicine and the availability of in person appointments. The patient expressed understanding and agreed to proceed.   HPI: Glenda [name] is a resident of [facility].  She just had hip replacement surgery few days ago that went well without complication.  She was admitted on the 16th and discharged on the 25th.  She had Covid testing on the 24th which came back negative.  She states she has had a couple days of some mostly dry cough and possible mild wheezing.  No history of any asthma or chronic lung disease.  She denies any body aches.  No significant nasal congestion.  Staff gave her Tessalon this morning which she thinks helps slightly.  She has not had any fevers or chills.  No major dyspnea at rest.  They have not been using pulse oximeter.  She is on Eliquis chronically.  No pleuritic pain.  No hemoptysis.   ROS: See pertinent positives and negatives per HPI.  Past Medical History:  Diagnosis Date  . Aortic stenosis    Echo 02/2011 showing mild AS with normal LV systolic function  . Arthritis    "lower back" (10/09/2015)  . Atrial flutter with rapid ventricular response ([place]) 08/19/2016   s/p successful DCCV on 08/20/16, continue eliquis  . Chronic bronchitis ([place])    "off and on; several years" (10/09/2015)  . Chronic diastolic CHF (congestive heart failure) ([place])    a. 12/2015 Echo: EF  55-60%, mild AI/MR, mildly dil LA.  Marland Kitchen Chronic lower back pain   . Complication of anesthesia    "they had trouble waking me up after colon resection"  . Confusion    Originally listed as TIA-pt denies this hx on 10/09/2015 "it was the [place] I was taking; they had thought I was having a st originally roke"  . DDD (degenerative disc disease), lumbar    severe facet dz and adv DDD MRI L spine 2009  . Diverticulosis   . Dysrhythmia    A-Fib  . Femur fracture, right ([place]) 10/18/2018  . GERD (gastroesophageal reflux disease)   . Hiatal hernia    hx  . Hypercholesterolemia   . Hypertension   . Insomnia   . OA (osteoarthritis) of knee   . Paroxysmal atrial flutter ([place])    a. 09/2015 s/p TEE/DCCV;  b. CHA2DS2VASc = 5-->Eliquis.  . Peripheral edema   . PVCs (premature ventricular contractions)   . Sacral fracture ([place]) 11/06/2012  . Vertigo 11/19/2012    Past Surgical History:  Procedure Laterality Date  . BREAST BIOPSY Left   . CARDIAC CATHETERIZATION  02/17/2001   MILD REGURGITATION. EF 60%  . CARDIOVERSION N/A 10/08/2015   Procedure: CARDIOVERSION;  Surgeon: Sanda Klein, MD;  Location: [facility];  Service: Cardiovascular;  Laterality: N/A;  . CARDIOVERSION N/A 08/20/2016   Procedure: Cardioversion;  Surgeon: Evans Lance, MD;  Location: [facility];  Service: Cardiovascular;  Laterality: N/A;  . CATARACT EXTRACTION W/ INTRAOCULAR  LENS  IMPLANT, BILATERAL Bilateral   . CESAREAN SECTION  1954  . COLECTOMY     related to "blockage"  . COLONOSCOPY    . EXCISIONAL HEMORRHOIDECTOMY    . HIP PINNING,CANNULATED Right 10/19/2018   Procedure: Right percutaneous hip pinning;  Surgeon: Erle Crocker, MD;  Location: [facility];  Service: Orthopedics;  Laterality: Right;  . INGUINAL HERNIA REPAIR Bilateral   . KYPHOPLASTY Right 09/11/2012   Procedure: Right Acrylic Sacroplasty;  Surgeon: Kristeen Miss, MD;  Location: [facility];  Service: Neurosurgery;  Laterality: Right;  Right   Acrylic Sacroplasty  . TEE WITHOUT CARDIOVERSION N/A 10/08/2015   Procedure: TRANSESOPHAGEAL ECHOCARDIOGRAM (TEE);  Surgeon: Sanda Klein, MD;  Location: [facility];  Service: Cardiovascular;  Laterality: N/A;  . TOTAL HIP ARTHROPLASTY Right 02/05/2019   Procedure: [place] TOTAL HIP ARTHROPLASTY ANTERIOR APPROACH AND REMOVAL OF SCREWS;  Surgeon: Frederik Pear, MD;  Location: [facility];  Service: Orthopedics;  Laterality: Right;  . TOTAL KNEE ARTHROPLASTY Right   . TOTAL MASTECTOMY Left 07/22/2017   Procedure: TOTAL MASTECTOMY;  Surgeon: Excell Seltzer, MD;  Location: [facility];  Service: General;  Laterality: Left;  . UMBILICAL HERNIA REPAIR      Family History  Problem Relation Age of Onset  . Heart disease Mother   . Hypertension Father   . Other Sister        73 in 2019  . Other Sister        38 in 2019  . Heart disease Sister   . Heart attack Neg Hx   . Stroke Neg Hx     SOCIAL HX: Non-[name].  Resident of [facility]   Current Outpatient Medications:  .  anastrozole (ARIMIDEX) 1 MG tablet, Take 1 tablet (1 mg total) by mouth daily., Disp: 90 tablet, Rfl: 3 .  apixaban (ELIQUIS) 2.5 MG TABS tablet, Take 1 tablet (2.5 mg total) by mouth 2 (two) times daily., Disp: 60 tablet, Rfl: 4 .  benzonatate (TESSALON) 100 MG capsule, Take 100 mg by mouth every 8 (eight) hours as needed for cough., Disp: , Rfl:  .  Cholecalciferol 1000 units tablet, Take 1,000 Units by mouth daily., Disp: , Rfl:  .  Cranberry 425 MG CAPS, Take 425 mg by mouth 2 (two) times daily., Disp: , Rfl:  .  docusate sodium (COLACE) 100 MG capsule, Take 100 mg by mouth 2 (two) times daily., Disp: , Rfl:  .  furosemide (LASIX) 20 MG tablet, Take 1.5 tablets (30 mg total) by mouth daily., Disp: 45 tablet, Rfl: 3 .  metoprolol tartrate (LOPRESSOR) 25 MG tablet, Take 0.5 tablets (12.5 mg total) by mouth 2 (two) times daily., Disp: 30 tablet, Rfl: 0 .  Multiple Vitamins-Minerals (CEROVITE ADVANCED FORMULA PO), Take 1 tablet by  mouth daily. (0800), Disp: , Rfl:  .  omeprazole (PRILOSEC) 40 MG capsule, Take 40 mg by mouth daily., Disp: , Rfl:  .  ondansetron (ZOFRAN) 4 MG tablet, Take 4 mg by mouth every 6 (six) hours as needed for nausea., Disp: , Rfl:  .  oxyCODONE-acetaminophen (PERCOCET/ROXICET) 5-325 MG tablet, Take 1 tablet by mouth every 4 (four) hours as needed for severe pain., Disp: 30 tablet, Rfl: 0 .  polyethylene glycol (MIRALAX / GLYCOLAX) packet, Take 17 g by mouth See admin instructions. Mix 17 grams in 8 ounces of juice/water and drink daily, Monday through Saturday, Disp: , Rfl:  .  sertraline (ZOLOFT) 25 MG tablet, TAKE (1) TABLET BY MOUTH ONCE DAILY. (Patient taking  differently: Take 25 mg by mouth daily. TAKE (1) TABLET BY MOUTH ONCE DAILY.), Disp: 30 tablet, Rfl: 3 .  temazepam (RESTORIL) 15 MG capsule, TAKE (1) CAPSULE BY MOUTH AT BEDTIME. SHOULD SEPARATE FROM HYDROCODONE BY AT LEAST 6 HOURS. (Patient taking differently: Take 15 mg by mouth at bedtime. Should separate from hydrocodone by at least 6 hours), Disp: 30 capsule, Rfl: 5 .  tiZANidine (ZANAFLEX) 2 MG tablet, Take 1 tablet (2 mg total) by mouth every 6 (six) hours as needed., Disp: 60 tablet, Rfl: 0  EXAM:  VITALS per patient if applicable:  GENERAL: alert, oriented, appears well and in no acute distress  HEENT: atraumatic, conjunttiva clear, no obvious abnormalities on inspection of external nose and ears  NECK: normal movements of the head and neck  LUNGS: on inspection no signs of respiratory distress, breathing rate appears normal, no obvious gross SOB, gasping or wheezing  CV: no obvious cyanosis  MS: moves all visible extremities without noticeable abnormality  PSYCH/NEURO: pleasant and cooperative, no obvious depression or anxiety, speech and thought processing grossly intact  ASSESSMENT AND PLAN:  Discussed the following assessment and plan:  85 year old with 2-day history of cough.  No respiratory distress at rest.  She  had recent hospitalization as above.  Covid testing 3 days ago negative  -Recommend plenty of fluids and she is encouraged to take several deep breaths per day.  She was not discharged with incentive spirometer. -Monitor closely for any fever or worsening symptoms -We have encouraged use of pulse oximetry monitoring if they have availability and be in touch of O2 sats in the low 90s or below -Continue Tessalon Per[place] as necessary for cough -Follow-up promptly for any fever, increased shortness of breath, or other concerns -They report some mild "wheezing" but would like to try to avoid prednisone use if possible given recent surgery     I discussed the assessment and treatment plan with the patient. The patient was provided an opportunity to ask questions and all were answered. The patient agreed with the plan and demonstrated an understanding of the instructions.   The patient was advised to call back or seek an in-person evaluation if the symptoms worsen or if the condition fails to improve as anticipated.    Carolann Littler, MD

## 2019-02-19 ENCOUNTER — Ambulatory Visit: Payer: Medicare Other | Admitting: Cardiology

## 2019-02-19 ENCOUNTER — Telehealth: Payer: Self-pay | Admitting: Family Medicine

## 2019-02-19 NOTE — Telephone Encounter (Signed)
Have sent message to reception to set up hospital follow up. We will not be able to sign orders until she has had follow up.

## 2019-02-19 NOTE — Telephone Encounter (Signed)
Please call to make virtual visit for hospital follow up.

## 2019-02-19 NOTE — Telephone Encounter (Signed)
Ok to put in same day 20 min? She has orders for you to fill out for home health. I informed them that we can not fill out until after you have seen patient.

## 2019-02-19 NOTE — Telephone Encounter (Signed)
Yes thanks May use 20-minute slot for virtual visit-does this qualify for TCM?

## 2019-02-19 NOTE — Telephone Encounter (Signed)
Signed Orders for PT and OT needed back asap  Fax# [phone]

## 2019-02-19 NOTE — Telephone Encounter (Signed)
Please advise if hospital f/u appts can be in 20 min same day slots, Dr. Hunter is booked.  °

## 2019-02-19 NOTE — Telephone Encounter (Signed)
Do not see a TCM call made on Monday-I think Friday counted as business day so not sure we can do this as TCM-please check with Loma Sousa

## 2019-02-19 NOTE — Telephone Encounter (Signed)
See note

## 2019-02-20 ENCOUNTER — Telehealth (INDEPENDENT_AMBULATORY_CARE_PROVIDER_SITE_OTHER): Payer: Medicare Other | Admitting: Cardiology

## 2019-02-20 ENCOUNTER — Telehealth: Payer: Self-pay

## 2019-02-20 ENCOUNTER — Encounter: Payer: Self-pay | Admitting: Cardiology

## 2019-02-20 VITALS — BP 185/92 | HR 57 | Ht 61.0 in

## 2019-02-20 DIAGNOSIS — G894 Chronic pain syndrome: Secondary | ICD-10-CM

## 2019-02-20 DIAGNOSIS — I4892 Unspecified atrial flutter: Secondary | ICD-10-CM

## 2019-02-20 DIAGNOSIS — I10 Essential (primary) hypertension: Secondary | ICD-10-CM | POA: Diagnosis not present

## 2019-02-20 DIAGNOSIS — R001 Bradycardia, unspecified: Secondary | ICD-10-CM | POA: Insufficient documentation

## 2019-02-20 DIAGNOSIS — Z96641 Presence of right artificial hip joint: Secondary | ICD-10-CM

## 2019-02-20 NOTE — Telephone Encounter (Signed)
Left message on patients son, Otisha Davignon, number as well advising him to have patient contact office for virtual visit.

## 2019-02-20 NOTE — Assessment & Plan Note (Signed)
Bradycardic in NSR- sensitive to beta blockers 

## 2019-02-20 NOTE — Telephone Encounter (Signed)
Patient would just be a level visit.  Unfortunately the discharges from the holiday were not included on my report yesterday due to Friday being counted as a business day.

## 2019-02-20 NOTE — Telephone Encounter (Signed)
Patient has been scheduled for a virtual 02/22/19 for HFU

## 2019-02-20 NOTE — Telephone Encounter (Signed)

## 2019-02-20 NOTE — Assessment & Plan Note (Signed)
Initially diagnosed in 0000000 complicated by CHF with moderate LVD then. She was cardioverted and her EF improved. She had recurrent PAF in 2018 and was cardioverted again. In Nov 2020 she had recurrent A-flutter with RVR after hip surgery which was complicated by hypotension requiring pressors and anemia requiring transfusion. She converted spontaneously to NSR-SB by discharge. Sent home on Lopressor 12.5 mg BID.

## 2019-02-20 NOTE — Patient Instructions (Signed)
Medication Instructions:  INCREASE Lasix to 40mg  for 4 days then go back to 30mg  daily; see if this  Helps with cough and swelling  *If you need a refill on your cardiac medications before your next appointment, please call your pharmacy*  Lab Work: None  If you have labs (blood work) drawn today and your tests are completely normal, you will receive your results only by: Marland Kitchen MyChart Message (if you have MyChart) OR . A paper copy in the mail If you have any lab test that is abnormal or we need to change your treatment, we will call you to review the results.  Testing/Procedures: None   Follow-Up: At [facility], you and your health needs are our priority.  As part of our continuing mission to provide you with exceptional heart care, we have created designated Provider Care Teams.  These Care Teams include your primary Cardiologist (physician) and Advanced Practice Providers (APPs -  Physician Assistants and Nurse Practitioners) who all work together to provide you with the care you need, when you need it.  Your next appointment:   2-3 month(s)  The format for your next appointment:   In Person  Provider:   Kirk Ruths, MD  Other Instructions

## 2019-02-20 NOTE — Telephone Encounter (Signed)
See below message ok to make app

## 2019-02-20 NOTE — Telephone Encounter (Signed)
Left message for patient to contact office to get virtual visit started.  

## 2019-02-20 NOTE — Progress Notes (Signed)
Virtual Visit via Telephone Note   This visit type was conducted due to national recommendations for restrictions regarding the COVID-19 Pandemic (e.g. social distancing) in an effort to limit this patient's exposure and mitigate transmission in our community.  Due to her co-morbid illnesses, this patient is at least at moderate risk for complications without adequate follow up.  This format is felt to be most appropriate for this patient at this time.  The patient did not have access to video technology/had technical difficulties with video requiring transitioning to audio format only (telephone).  All issues noted in this document were discussed and addressed.  No physical exam could be performed with this format.  Please refer to the patient's chart for her  consent to telehealth for [facility].   Date:  02/20/2019   ID:  Glenda [name], DOB 06/25/1924, MRN [id number]  Patient Location: Home Provider Location: Home  PCP:  Marin Olp, MD  Cardiologist:  Kirk Ruths, MD  Electrophysiologist:  None   Evaluation Performed:  Follow-Up Visit-post hospital  Chief Complaint:  Cough, swelling  History of Present Illness:    Glenda [name] is a 85 y.o. female with a history of atrial flutter initially diagnosed in 2017.  She she was admitted to the hospital recently for right hip replacement.  Postoperatively she had complications including hypotension which required IV pressors, anemia which required transfusion, and recurrent atrial flutter with rapid ventricular response.  She was placed on a beta-blocker.  She has had issues with bradycardia in the past on beta-blocker.  The dose had to be cut back during her hospitalization to 12.5 mg of metoprolol twice daily by discharge.  She was actually scheduled to have a cardioversion but on the morning of her cardioversion she converted spontaneously to sinus rhythm sinus bradycardia.  She was discharged to SNF.  She also had an  echocardiogram which showed her ejection fraction was preserved.  She did have a pericardial effusion noted on her transthoracic echo, but this was not felt to be significant after the study was reviewed by Dr Stanford Breed.   The patient was contacted at the nursing home today.  The patient was in the room and I also spoke with Glenda [name], the patient's nursing aide.  Since discharge the patient has had problems with pain.  She has not had orthopnea.  She has had some edema in her legs and has had a dry cough.  In the past she had been on losartan and amlodipine for hypertension but these were held when she was discharged because of hypotension.  Glenda [name] tells me that the patient's elevated B/P today was when she was in pain and that it usually runs [id number] systolic.   The patient does not have symptoms concerning for COVID-19 infection (fever, chills, cough, or new shortness of breath).    Past Medical History:  Diagnosis Date   Aortic stenosis    Echo 02/2011 showing mild AS with normal LV systolic function   Arthritis    "lower back" (10/09/2015)   Atrial flutter with rapid ventricular response ([place]) 08/19/2016   s/p successful DCCV on 08/20/16, continue eliquis   Chronic bronchitis ([place])    "off and on; several years" (10/09/2015)   Chronic diastolic CHF (congestive heart failure) ([place])    a. 12/2015 Echo: EF 55-60%, mild AI/MR, mildly dil LA.   Chronic lower back pain    Complication of anesthesia    "they had trouble waking me up after colon resection"  Confusion    Originally listed as TIA-pt denies this hx on 10/09/2015 "it was the [place] I was taking; they had thought I was having a st originally roke"   DDD (degenerative disc disease), lumbar    severe facet dz and adv DDD MRI L spine 2009   Diverticulosis    Dysrhythmia    A-Fib   Femur fracture, right ([place]) 10/18/2018   GERD (gastroesophageal reflux disease)    Hiatal hernia    hx   Hypercholesterolemia     Hypertension    Insomnia    OA (osteoarthritis) of knee    Paroxysmal atrial flutter ([place])    a. 09/2015 s/p TEE/DCCV;  b. CHA2DS2VASc = 5-->Eliquis.   Peripheral edema    PVCs (premature ventricular contractions)    Sacral fracture ([place]) 11/06/2012   Vertigo 11/19/2012   Past Surgical History:  Procedure Laterality Date   BREAST BIOPSY Left    CARDIAC CATHETERIZATION  02/17/2001   MILD REGURGITATION. EF 60%   CARDIOVERSION N/A 10/08/2015   Procedure: CARDIOVERSION;  Surgeon: Sanda Klein, MD;  Location: [facility];  Service: Cardiovascular;  Laterality: N/A;   CARDIOVERSION N/A 08/20/2016   Procedure: Cardioversion;  Surgeon: Evans Lance, MD;  Location: [facility];  Service: Cardiovascular;  Laterality: N/A;   CATARACT EXTRACTION W/ INTRAOCULAR LENS  IMPLANT, BILATERAL Bilateral    CESAREAN SECTION  1954   COLECTOMY     related to "blockage"   COLONOSCOPY     EXCISIONAL HEMORRHOIDECTOMY     HIP PINNING,CANNULATED Right 10/19/2018   Procedure: Right percutaneous hip pinning;  Surgeon: Erle Crocker, MD;  Location: [facility];  Service: Orthopedics;  Laterality: Right;   INGUINAL HERNIA REPAIR Bilateral    KYPHOPLASTY Right 09/11/2012   Procedure: Right Acrylic Sacroplasty;  Surgeon: Kristeen Miss, MD;  Location: [facility];  Service: Neurosurgery;  Laterality: Right;  Right  Acrylic Sacroplasty   TEE WITHOUT CARDIOVERSION N/A 10/08/2015   Procedure: TRANSESOPHAGEAL ECHOCARDIOGRAM (TEE);  Surgeon: Sanda Klein, MD;  Location: [facility];  Service: Cardiovascular;  Laterality: N/A;   TOTAL HIP ARTHROPLASTY Right 02/05/2019   Procedure: [place] TOTAL HIP ARTHROPLASTY ANTERIOR APPROACH AND REMOVAL OF SCREWS;  Surgeon: Frederik Pear, MD;  Location: [facility];  Service: Orthopedics;  Laterality: Right;   TOTAL KNEE ARTHROPLASTY Right    TOTAL MASTECTOMY Left 07/22/2017   Procedure: TOTAL MASTECTOMY;  Surgeon: Excell Seltzer, MD;  Location: [facility];   Service: General;  Laterality: Left;   UMBILICAL HERNIA REPAIR       Current Meds  Medication Sig   anastrozole (ARIMIDEX) 1 MG tablet Take 1 tablet (1 mg total) by mouth daily.   apixaban (ELIQUIS) 2.5 MG TABS tablet Take 1 tablet (2.5 mg total) by mouth 2 (two) times daily.   benzonatate (TESSALON) 100 MG capsule Take 100 mg by mouth every 8 (eight) hours as needed for cough.   Cholecalciferol 1000 units tablet Take 1,000 Units by mouth daily.   Cranberry 425 MG CAPS Take 425 mg by mouth 2 (two) times daily.   docusate sodium (COLACE) 100 MG capsule Take 100 mg by mouth 2 (two) times daily.   furosemide (LASIX) 20 MG tablet Take 1.5 tablets (30 mg total) by mouth daily.   metoprolol tartrate (LOPRESSOR) 25 MG tablet Take 0.5 tablets (12.5 mg total) by mouth 2 (two) times daily.   Multiple Vitamins-Minerals (CEROVITE ADVANCED FORMULA PO) Take 1 tablet by mouth daily. (0800)   omeprazole (PRILOSEC) 40 MG capsule Take  40 mg by mouth daily.   ondansetron (ZOFRAN) 4 MG tablet Take 4 mg by mouth every 6 (six) hours as needed for nausea.   oxyCODONE-acetaminophen (PERCOCET/ROXICET) 5-325 MG tablet Take 1 tablet by mouth every 4 (four) hours as needed for severe pain.   polyethylene glycol (MIRALAX / GLYCOLAX) packet Take 17 g by mouth See admin instructions. Mix 17 grams in 8 ounces of juice/water and drink daily, Monday through Saturday   sertraline (ZOLOFT) 25 MG tablet TAKE (1) TABLET BY MOUTH ONCE DAILY. (Patient taking differently: Take 25 mg by mouth daily. TAKE (1) TABLET BY MOUTH ONCE DAILY.)   temazepam (RESTORIL) 15 MG capsule TAKE (1) CAPSULE BY MOUTH AT BEDTIME. SHOULD SEPARATE FROM HYDROCODONE BY AT LEAST 6 HOURS. (Patient taking differently: Take 15 mg by mouth at bedtime. Should separate from hydrocodone by at least 6 hours)   tiZANidine (ZANAFLEX) 2 MG tablet Take 1 tablet (2 mg total) by mouth every 6 (six) hours as needed.     Allergies:   Ace inhibitors, Halcion  [triazolam], Pentazocine lactate, Sulfa drugs cross reactors, Trazodone and nefazodone, Amitriptyline, Avelox [moxifloxacin hcl in nacl], and Penicillins   Social History   Tobacco Use   Smoking status: Never Smoker   Smokeless tobacco: Never Used  Substance Use Topics   Alcohol use: No   Drug use: No     Family Hx: The patient's family history includes Heart disease in her mother and sister; Hypertension in her father; Other in her sister and sister. There is no history of Heart attack or Stroke.  ROS:   Please see the history of present illness.    All other systems reviewed and are negative.   Prior CV studies:   The following studies were reviewed today:  Echo 02/09/2019  Labs/Other Tests and Data Reviewed:    EKG:  No ECG reviewed.  Recent Labs: 03/20/2018: ALT 9 02/09/2019: Magnesium 2.0; TSH 1.203 02/13/2019: BUN 32; Creatinine, Ser 0.90; Hemoglobin 9.9; Platelets 218; Potassium 4.3; Sodium 137   Recent Lipid Panel Lab Results  Component Value Date/Time   CHOL 182 08/20/2016 05:33 AM   TRIG 47 08/20/2016 05:33 AM   HDL 73 08/20/2016 05:33 AM   CHOLHDL 2.5 08/20/2016 05:33 AM   LDLCALC 100 (H) 08/20/2016 05:33 AM    Wt Readings from Last 3 Encounters:  02/12/19 116 lb 13.5 oz (53 kg)  01/30/19 116 lb (52.6 kg)  12/05/18 119 lb (54 kg)     Objective:    Vital Signs:  BP (!) 185/92    Pulse (!) 57    Ht 5\' 1"  (1.549 m)    BMI 22.08 kg/m    VITAL SIGNS:  reviewed  ASSESSMENT & PLAN:    Atrial flutter with RVR- Initially diagnosed in 0000000 complicated by CHF with moderate LVD then. She was cardioverted and her EF improved. She had recurrent PAF in 2018 and was cardioverted again. In Nov 2020 she had recurrent A-flutter with RVR after hip surgery which was complicated by hypotension requiring pressors and anemia requiring transfusion. She converted spontaneously to NSR-SB by discharge. Sent home on Lopressor 12.5 mg BID.   Bradycardia- Bradycardic  in NSR- sensitive to beta blockers  HTN- B/P meds held at discharge secondary to hypotension- this will need to be followed.   Chronic anticoagulation- CHADs VASc=5- on low dose Eliquis based on weight and age  S/P 75 S/P RTHR [id number]- complicated by hypotension, anemia, and AF (flutter) with RVR  COVID-19 Education: The signs  and symptoms of COVID-19 were discussed with the patient and how to seek care for testing (follow up with PCP or arrange E-visit).  The importance of social distancing was discussed today.  Time:   Today, I have spent 20 minutes with the patient with telehealth technology discussing the above problems.     Medication Adjustments/Labs and Tests Ordered: Current medicines are reviewed at length with the patient today.  Concerns regarding medicines are outlined above.   Tests Ordered: No orders of the defined types were placed in this encounter.   Medication Changes: No orders of the defined types were placed in this encounter.   Follow Up:  In Person Dr Stanford Breed in 2-3 months. I suggested they increase her Lasix to 40 mg daily for 4 days to see if that had any impact on her edema and cough.   Signed, Kerin Ransom, PA-C  02/20/2019 3:56 PM    [facility]

## 2019-02-20 NOTE — Telephone Encounter (Signed)
Yes thanks-can make appointment

## 2019-02-20 NOTE — Telephone Encounter (Signed)
De Burrs from nursing home now answer; left message for her to contact office to get avs instructions.

## 2019-02-21 MED ORDER — FUROSEMIDE 40 MG PO TABS: 40.0000 mg | ORAL_TABLET | Freq: Every day | ORAL | 0 refills | Status: DC

## 2019-02-21 NOTE — Telephone Encounter (Signed)
[place] to speak with patients nurse Helene Kelp, left message for her to contact office to discuss patients discharge instructions and schedule follow up appt.

## 2019-02-21 NOTE — Telephone Encounter (Signed)
Spoke with nurse at facility and patient is more short of breath and her face is puffy. Facilaty has not started increase dose of Lasix at this time. I did not send over because I had not spoken with nurse to give instructions.   Fax number [phone]. To fax over order for change increase dose of Lasix (40mg  for 4 days) then go back to original dose 30mg  qd.   Reviewed AVS instructions with Helene Kelp, nurse at facility. Scheduled patients follow up appt. She voiced understanding.

## 2019-02-22 ENCOUNTER — Ambulatory Visit (INDEPENDENT_AMBULATORY_CARE_PROVIDER_SITE_OTHER): Payer: Medicare Other | Admitting: Family Medicine

## 2019-02-22 ENCOUNTER — Encounter: Payer: Self-pay | Admitting: Family Medicine

## 2019-02-22 VITALS — BP 161/83 | HR 53 | Ht 61.0 in

## 2019-02-22 DIAGNOSIS — G894 Chronic pain syndrome: Secondary | ICD-10-CM

## 2019-02-22 DIAGNOSIS — I10 Essential (primary) hypertension: Secondary | ICD-10-CM

## 2019-02-22 DIAGNOSIS — I4892 Unspecified atrial flutter: Secondary | ICD-10-CM

## 2019-02-22 DIAGNOSIS — I5032 Chronic diastolic (congestive) heart failure: Secondary | ICD-10-CM | POA: Diagnosis not present

## 2019-02-22 DIAGNOSIS — G4700 Insomnia, unspecified: Secondary | ICD-10-CM

## 2019-02-22 DIAGNOSIS — F419 Anxiety disorder, unspecified: Secondary | ICD-10-CM

## 2019-02-22 DIAGNOSIS — M159 Polyosteoarthritis, unspecified: Secondary | ICD-10-CM | POA: Diagnosis not present

## 2019-02-22 DIAGNOSIS — Z96641 Presence of right artificial hip joint: Secondary | ICD-10-CM

## 2019-02-22 NOTE — Progress Notes (Signed)
Phone [phone] Virtual visit via Video note   Subjective:  Chief complaint: Chief Complaint  Patient presents with  . Follow-up    This visit type was conducted due to national recommendations for restrictions regarding the COVID-19 Pandemic (e.g. social distancing).  This format is felt to be most appropriate for this patient at this time balancing risks to patient and risks to population by having him in for in person visit.  No physical exam was performed (except for noted visual exam or audio findings with Telehealth visits).    Our team/I connected with Glenda [name] at  1:00 PM EST by a video enabled telemedicine application (doxy.me or caregility through epic) and verified that I am speaking with the correct person using two identifiers.  Location patient: [facility] Location provider: [facility], office Persons participating in the virtual visit:  patient  Our team/I discussed the limitations of evaluation and management by telemedicine and the availability of in person appointments. In light of current covid-19 pandemic, patient also understands that we are trying to protect them by minimizing in office contact if at all possible.  The patient expressed consent for telemedicine visit and agreed to proceed. Patient understands insurance will be billed.   ROS-slight cough but improving.  Some shortness of breath but improving.  Reports edema.  No chest pain reported.  No fever or chills.  Past Medical History-  Patient Active Problem List   Diagnosis Date Noted  . Chronic pain syndrome 02/09/2018    Priority: High  . Chronic narcotic use 08/11/2017    Priority: High  . Malignant neoplasm of upper-outer quadrant of left breast in female, estrogen receptor positive ([place]) 06/27/2017    Priority: High  . Atrial flutter ([place])     Priority: High  . Chronic diastolic HF (heart failure) ([place]) 07/11/2012    Priority: High  . Osteoarthritis 04/03/2012    Priority: High  .  Abdominal hernia 05/19/2018    Priority: Medium  . Shortness of breath 12/13/2014    Priority: Medium  . Essential hypertension 03/25/2011    Priority: Medium  . Insomnia 09/15/2010    Priority: Medium  . History of cardioversion 08/20/2016    Priority: Low  . Palpitations 12/13/2014    Priority: Low  . Constipation 11/06/2012    Priority: Low  . Accelerated/malignant hypertension 09/14/2012    Priority: Low  . Anemia 06/10/2011    Priority: Low  . Bradycardia 02/20/2019  . Hypotension due to hypovolemia   . Status post total replacement of right hip 02/05/2019  . Osteoarthritis of right hip 02/01/2019  . Hyponatremia 10/19/2018  . Femur fracture, right ([place]) 10/18/2018  . Hip fracture ([place]) 10/18/2018  . Anxiety 08/08/2018  . Atrial flutter with rapid ventricular response ([place]) 08/19/2016  . Chronic anticoagulation-Eliquis 02/04/2016    Medications- reviewed and updated Current Outpatient Medications  Medication Sig Dispense Refill  . anastrozole (ARIMIDEX) 1 MG tablet Take 1 tablet (1 mg total) by mouth daily. 90 tablet 3  . apixaban (ELIQUIS) 2.5 MG TABS tablet Take 1 tablet (2.5 mg total) by mouth 2 (two) times daily. 60 tablet 4  . benzonatate (TESSALON) 100 MG capsule Take 100 mg by mouth every 8 (eight) hours as needed for cough.    . Cholecalciferol 1000 units tablet Take 1,000 Units by mouth daily.    . Cranberry 425 MG CAPS Take 425 mg by mouth 2 (two) times daily.    Marland Kitchen docusate sodium (COLACE) 100 MG capsule Take 100  mg by mouth 2 (two) times daily.    . furosemide (LASIX) 20 MG tablet Take 1.5 tablets (30 mg total) by mouth daily. 45 tablet 3  . furosemide (LASIX) 40 MG tablet Take 1 tablet (40 mg total) by mouth daily for 4 days. 4 tablet 0  . metoprolol tartrate (LOPRESSOR) 25 MG tablet Take 0.5 tablets (12.5 mg total) by mouth 2 (two) times daily. 30 tablet 0  . Multiple Vitamins-Minerals (CEROVITE ADVANCED FORMULA PO) Take 1 tablet by mouth daily. (0800)     . omeprazole (PRILOSEC) 40 MG capsule Take 40 mg by mouth daily.    . ondansetron (ZOFRAN) 4 MG tablet Take 4 mg by mouth every 6 (six) hours as needed for nausea.    Marland Kitchen oxyCODONE-acetaminophen (PERCOCET/ROXICET) 5-325 MG tablet Take 1 tablet by mouth every 4 (four) hours as needed for severe pain. 30 tablet 0  . polyethylene glycol (MIRALAX / GLYCOLAX) packet Take 17 g by mouth See admin instructions. Mix 17 grams in 8 ounces of juice/water and drink daily, Monday through Saturday    . sertraline (ZOLOFT) 25 MG tablet TAKE (1) TABLET BY MOUTH ONCE DAILY. (Patient taking differently: Take 25 mg by mouth daily. TAKE (1) TABLET BY MOUTH ONCE DAILY.) 30 tablet 3  . temazepam (RESTORIL) 15 MG capsule TAKE (1) CAPSULE BY MOUTH AT BEDTIME. SHOULD SEPARATE FROM HYDROCODONE BY AT LEAST 6 HOURS. (Patient taking differently: Take 15 mg by mouth at bedtime. Should separate from hydrocodone by at least 6 hours) 30 capsule 5  . tiZANidine (ZANAFLEX) 2 MG tablet Take 1 tablet (2 mg total) by mouth every 6 (six) hours as needed. 60 tablet 0   No current facility-administered medications for this visit.      Objective:  BP (!) 161/83   Pulse (!) 53   Ht 5\' 1"  (1.549 m)   BMI 22.08 kg/m  self reported vitals Gen: NAD, resting comfortably Lungs: nonlabored, normal respiratory rate  Skin: appears dry, no obvious rash     Assessment and Plan   #Hospital follow-up-patient was hospitalized for right total hip replacement.  Unfortunately she developed hypotension as well as a flutter with RVR after surgery.  She required pressor use short-term.  She also developed significant anemia requiring 2 blood transfusions.  Fortunately she did convert to normal sinus rhythm by discharge.  She had some blood pressure medicines held as below.  Recently had follow-up on the first with cardiology by televisit and had Lasix increased.  She has been able to avoid constipation with MiraLAX  #Cough-developed cough  postoperatively and has tested negative for COVID-19.  States has some slight wheeze but we have opted to hold off on prednisone due to recent surgery.  Patient reports symptoms are improving today-due to improvement we opted to hold off on additional work-up-if worsens will try to get chest x-ray and may repeat Covid test potentially  #Hypertension S: Compliant with metoprolol 12.5 mg twice daily and Lasix 30 mg-no is on short-term increase to 40 mg for several days through cardiology  Patient was significantly elevated blood pressures in the past that actually responded well to l low dose zoloft for anxiety portion early 2020.  She was on losartan 100 mg and amlodipine 5 mg before hospitalization.  Home monitoring-180s during cardiology visit a few days ago now trending down to 160s A/P: Poor control today but seems to be improving with higher dose Lasix 40 mg for several days.  Instead of increasing medicine at this time  we wrote an order for her facility to let us know readings in approximately a week.  If blood pressure remains elevated would likely restart losartan 100 mg.  We will try to hold off on amlodipine for now due to reported edema and increased Lasix.   #Osteoarthritis/insomnia S: Patient with continued pain particularly in her low back.  She was on hydrocodone prior to surgery but currently on oxycodone after the surgery.  She is still taking temazepam at bedtime despite taking oxycodone every 4 hours-I advised against this-should try to space a minimum of 4 hours but ideally at least 6 hours to reduce risk of confusion or respiratory depression.  At least at present her fall risk is reduced as she is not getting out of bed for hip surgery.  Previously we had tried to reduce temazepam to 7.5 mg but this was not cost effective A/P: Patient with ongoing hip pain after surgery-she is hoping to see significant improvement over time and with therapy.  For now she is on oxycodone instead of  hydrocodone I previously prescribed.  Encouraged her to avoid using this along with temazepam within minimum of 4 hours but ideally at least 6 hours.  She may try temazepam and tizanidine before bed-discussed still not ideal I would increase fall risk but as noted currently not ambulatory. -Last UDS May 2019-we will need to complete when she is able to come back in the office in relation to [place] not reviewed today as most recent prescription from hospital -Controlled substance contract May 2019  #Chronic diastolic heart failure S: Compliant with Lasix 30 mg typically.  Hesitant to increase amlodipine due to heart failure.  Edema-has increased recently and is now up to 40 mg for several days to cardiology.  She had some shortness of breath with speech but that seems to be getting better. A/P: Recent exacerbation-increase Lasix dose seems to be helping.  #Atrial flutter S:Patient compliant with Eliquis.  Cardioversion 2017 and appears to stay in normal sinus rhythm for the most part-did have recurrent episode in the hospital and now on metoprolol 12.5 mg twice a day.  Heart rate slightly low A/P: Reportedly back in sinus rhythm before discharge-continue metoprolol for rate control in case converts back to atrial flutter as long as heart rate does not get too low.  Continue Eliquis for anticoagulation.  #osteopenia-she has wanted to wait until conditions for COVID-19 are better before getting updated bone density   % Breast cancer-continues to follow with Dr. Payton Mccallum  Recommended follow up: We did not schedule plan follow-up but will need a visit for her next hydrocodone refill after converts back from oxycodone Future Appointments  Date Time Provider [place]  04/30/2019 11:00 AM Lelon Perla, MD [facility]  10/15/2019 11:00 AM Nicholas Lose, MD [facility] None    Lab/Order associations: No diagnosis found.  No orders of the defined types were placed in this  encounter.   Return precautions advised.  Garret Reddish, MD

## 2019-02-22 NOTE — Assessment & Plan Note (Signed)
S: Compliant with metoprolol 12.5 mg twice daily and Lasix 30 mg-no is on short-term increase to 40 mg for several days through cardiology  Patient was significantly elevated blood pressures in the past that actually responded well to l low dose zoloft for anxiety portion early 2020.  She was on losartan 100 mg and amlodipine 5 mg before hospitalization.  Home monitoring-180s during cardiology visit a few days ago now trending down to 160s A/P: Poor control today but seems to be improving with higher dose Lasix 40 mg for several days.  Instead of increasing medicine at this time we wrote an order for her facility to let us know readings in approximately a week.  If blood pressure remains elevated would likely restart losartan 100 mg.  We will try to hold off on amlodipine for now due to reported edema and increased Lasix.

## 2019-02-22 NOTE — Patient Instructions (Addendum)
Health Maintenance Due  Topic Date Due  . INFLUENZA VACCINE -12/2018 10/21/2018   zofran before oxycodone  Blood pressure monitoring for 1 week and then they will send me a copy- hoping lasix 40mg  reduced blood pressure and if not I will restart losartan next week  Hold off on temazepam while on oxycodone- separate by at least 6 hours

## 2019-02-27 ENCOUNTER — Other Ambulatory Visit: Payer: Self-pay | Admitting: Family Medicine

## 2019-03-12 MED ORDER — HYDROCODONE-ACETAMINOPHEN 5-325 MG PO TABS: 1.0000 | ORAL_TABLET | Freq: Two times a day (BID) | ORAL | 0 refills | Status: DC

## 2019-03-12 NOTE — Addendum Note (Signed)
Addended by: Marin Olp on: 03/12/2019 05:19 PM   Modules accepted: Orders

## 2019-03-12 NOTE — Progress Notes (Signed)
Received fax from [facility] requesting refill on hydrocodone.  Refilled medication but also noted she should not be on oxycodone/Percocet anymore.

## 2019-03-20 ENCOUNTER — Other Ambulatory Visit: Payer: Self-pay | Admitting: Family Medicine

## 2019-03-27 ENCOUNTER — Other Ambulatory Visit: Payer: Self-pay | Admitting: Family Medicine

## 2019-04-09 ENCOUNTER — Other Ambulatory Visit: Payer: Self-pay

## 2019-04-19 ENCOUNTER — Other Ambulatory Visit: Payer: Self-pay | Admitting: Orthopedic Surgery

## 2019-04-19 DIAGNOSIS — M25551 Pain in right hip: Secondary | ICD-10-CM

## 2019-04-19 DIAGNOSIS — M545 Low back pain, unspecified: Secondary | ICD-10-CM

## 2019-04-23 NOTE — Progress Notes (Signed)
HPI: FU hypertension andatrial flutter. History of atypical chest pain and palpitations. She had a exercise treadmill test on 01/26/11 which showed no evidence of ischemia. 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 Admitted July 2017 with atrial flutter vs atrial tachycardia and underwent TEE guided cardioversion. Patient had recurrent atrial flutter May 2018 and had repeat cardioversion. Appointment was scheduled to see Dr. Lovena Le for consideration of ablation but he was not availableandseen by[place]e [place]; she was inclined not to pursue procedures. Monitor March 2019 showed sinus with PACs and PVCs but no atrial flutter. Echocardiogram November 2020 showed normal LV function, mild left atrial enlargement, mild to moderate right atrial enlargement, moderate pericardial effusion, mild mitral regurgitation.  I reviewed this study at the time and felt the pericardial effusion was small.  Patient had recurrent atrial flutter following hip replacement November 2020.  Since last seen,she has dyspnea at times with activities unchanged.  No orthopnea, PND or pedal edema.  No chest pain or syncope.  Current Outpatient Medications  Medication Sig Dispense Refill  . anastrozole (ARIMIDEX) 1 MG tablet Take 1 tablet (1 mg total) by mouth daily. 90 tablet 3  . apixaban (ELIQUIS) 2.5 MG TABS tablet Take 1 tablet (2.5 mg total) by mouth 2 (two) times daily. 60 tablet 4  . benzonatate (TESSALON) 100 MG capsule Take 100 mg by mouth every 8 (eight) hours as needed for cough.    . Cholecalciferol 1000 units tablet Take 1,000 Units by mouth daily.    . Cranberry 425 MG CAPS Take 425 mg by mouth 2 (two) times daily.    Marland Kitchen docusate sodium (COLACE) 100 MG capsule Take 100 mg by mouth 2 (two) times daily.    . furosemide (LASIX) 20 MG tablet Take 1.5 tablets (30 mg total) by mouth daily. 45 tablet 3  . HYDROcodone-acetaminophen (NORCO/VICODIN) 5-325 MG tablet Take 1 tablet by mouth 2 (two) times daily. At 9 Am and 3 PM. Separate by 6  hours from temazepam. Fill in 1 month 60 tablet 0  . HYDROcodone-acetaminophen (NORCO/VICODIN) 5-325 MG tablet Take 1 tablet by mouth 2 (two) times daily. At 9 Am and 3 PM. Separate by 6 hours from temazepam. Fill today. 60 tablet 0  . HYDROcodone-acetaminophen (NORCO/VICODIN) 5-325 MG tablet Take 1 tablet by mouth 2 (two) times daily. At 9 Am and 3 PM. Separate by 6 hours from temazepam. Fill in 2 months 60 tablet 0  . metoprolol tartrate (LOPRESSOR) 25 MG tablet TAKE (1/2) TABLET (25MG ) BY MOUTH TWICE DAILY. 90 tablet 0  . Multiple Vitamins-Minerals (CEROVITE ADVANCED FORMULA PO) Take 1 tablet by mouth daily. (0800)    . omeprazole (PRILOSEC) 40 MG capsule Take 40 mg by mouth daily.    . ondansetron (ZOFRAN) 4 MG tablet Take 4 mg by mouth every 6 (six) hours as needed for nausea.    . polyethylene glycol (MIRALAX / GLYCOLAX) packet Take 17 g by mouth See admin instructions. Mix 17 grams in 8 ounces of juice/water and drink daily, Monday through Saturday    . sertraline (ZOLOFT) 25 MG tablet TAKE (1) TABLET BY MOUTH ONCE DAILY. (Patient taking differently: Take 25 mg by mouth daily. TAKE (1) TABLET BY MOUTH ONCE DAILY.) 30 tablet 3  . temazepam (RESTORIL) 15 MG capsule TAKE (1) CAPSULE BY MOUTH AT BEDTIME. SHOULD SEPARATE FROM HYDROCODONE BY AT LEAST 6 HOURS. (Patient taking differently: Take 15 mg by mouth at bedtime. Should separate from hydrocodone by at least 6 hours) 30 capsule  5  . tiZANidine (ZANAFLEX) 2 MG tablet Take 1 tablet (2 mg total) by mouth every 6 (six) hours as needed. 60 tablet 0   No current facility-administered medications for this visit.     Past Medical History:  Diagnosis Date  . Aortic stenosis    Echo 02/2011 showing mild AS with normal LV systolic function  . Arthritis    "lower back" (10/09/2015)  . Atrial flutter with rapid ventricular response ([place]) 08/19/2016   s/p successful DCCV on 08/20/16, continue eliquis  . Chronic bronchitis ([place])    "off and on; several  years" (10/09/2015)  . Chronic diastolic CHF (congestive heart failure) ([place])    a. 12/2015 Echo: EF 55-60%, mild AI/MR, mildly dil LA.  Marland Kitchen Chronic lower back pain   . Complication of anesthesia    "they had trouble waking me up after colon resection"  . Confusion    Originally listed as TIA-pt denies this hx on 10/09/2015 "it was the [place] I was taking; they had thought I was having a st originally roke"  . DDD (degenerative disc disease), lumbar    severe facet dz and adv DDD MRI L spine 2009  . Diverticulosis   . Dysrhythmia    A-Fib  . Femur fracture, right ([place]) 10/18/2018  . GERD (gastroesophageal reflux disease)   . Hiatal hernia    hx  . Hypercholesterolemia   . Hypertension   . Insomnia   . OA (osteoarthritis) of knee   . Paroxysmal atrial flutter ([place])    a. 09/2015 s/p TEE/DCCV;  b. CHA2DS2VASc = 5-->Eliquis.  . Peripheral edema   . PVCs (premature ventricular contractions)   . Sacral fracture ([place]) 11/06/2012  . Vertigo 11/19/2012    Past Surgical History:  Procedure Laterality Date  . BREAST BIOPSY Left   . CARDIAC CATHETERIZATION  02/17/2001   MILD REGURGITATION. EF 60%  . CARDIOVERSION N/A 10/08/2015   Procedure: CARDIOVERSION;  Surgeon: Sanda Klein, MD;  Location: [facility];  Service: Cardiovascular;  Laterality: N/A;  . CARDIOVERSION N/A 08/20/2016   Procedure: Cardioversion;  Surgeon: Evans Lance, MD;  Location: [facility];  Service: Cardiovascular;  Laterality: N/A;  . CATARACT EXTRACTION W/ INTRAOCULAR LENS  IMPLANT, BILATERAL Bilateral   . CESAREAN SECTION  1954  . COLECTOMY     related to "blockage"  . COLONOSCOPY    . EXCISIONAL HEMORRHOIDECTOMY    . HIP PINNING,CANNULATED Right 10/19/2018   Procedure: Right percutaneous hip pinning;  Surgeon: Erle Crocker, MD;  Location: [facility];  Service: Orthopedics;  Laterality: Right;  . INGUINAL HERNIA REPAIR Bilateral   . KYPHOPLASTY Right 09/11/2012   Procedure: Right Acrylic Sacroplasty;   Surgeon: Kristeen Miss, MD;  Location: [facility];  Service: Neurosurgery;  Laterality: Right;  Right  Acrylic Sacroplasty  . TEE WITHOUT CARDIOVERSION N/A 10/08/2015   Procedure: TRANSESOPHAGEAL ECHOCARDIOGRAM (TEE);  Surgeon: Sanda Klein, MD;  Location: [facility];  Service: Cardiovascular;  Laterality: N/A;  . TOTAL HIP ARTHROPLASTY Right 02/05/2019   Procedure: [place] TOTAL HIP ARTHROPLASTY ANTERIOR APPROACH AND REMOVAL OF SCREWS;  Surgeon: Frederik Pear, MD;  Location: [facility];  Service: Orthopedics;  Laterality: Right;  . TOTAL KNEE ARTHROPLASTY Right   . TOTAL MASTECTOMY Left 07/22/2017   Procedure: TOTAL MASTECTOMY;  Surgeon: Excell Seltzer, MD;  Location: [facility];  Service: General;  Laterality: Left;  . UMBILICAL HERNIA REPAIR      Social History   Socioeconomic History  . Marital status: Widowed  Spouse name: Not on file  . Number of children: Not on file  . Years of education: Not on file  . Highest education level: Not on file  Occupational History  . Not on file  Tobacco Use  . Smoking status: Never Smoker  . Smokeless tobacco: Never Used  Substance and Sexual Activity  . Alcohol use: No  . Drug use: No  . Sexual activity: Never  Other Topics Concern  . 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 Difficulty of Paying Living Expenses: Not on file  Food Insecurity:   . Worried About Charity fundraiser in the Last Year: Not on file  . Ran Out of Food in the Last Year: Not on file  Transportation Needs:   . Lack of Transportation (Medical): Not on file  . Lack of  Transportation (Non-Medical): Not on file  Physical Activity:   . Days of Exercise per Week: Not on file  . Minutes of Exercise per Session: Not on file  Stress:   . Feeling of Stress : Not on file  Social Connections:   . Frequency of Communication with Friends and Family: Not on file  . Frequency of Social Gatherings with Friends and Family: Not on file  . Attends Religious Services: Not on file  . Active Member of Clubs or Organizations: Not on file  . Attends Archivist Meetings: Not on file  . Marital Status: Not on file  Intimate Partner Violence:   . Fear of Current or Ex-Partner: Not on file  . Emotionally Abused: Not on file  . Physically Abused: Not on file  . Sexually Abused: Not on file    Family History  Problem Relation Age of Onset  . Heart disease Mother   . Hypertension Father   . Other Sister        66 in 2019  . Other Sister        23 in 2019  . Heart disease Sister   . Heart attack Neg Hx   . Stroke Neg Hx     ROS: no fevers or chills, productive cough, hemoptysis, dysphasia, odynophagia, melena, hematochezia, dysuria, hematuria, rash, seizure activity, orthopnea, PND, pedal edema, claudication. Remaining systems are negative.  Physical Exam: Well-developed well-nourished in no acute distress.  Skin is warm and dry.  HEENT is normal.  Neck is supple.  Chest is clear to auscultation with normal expansion.  Cardiovascular exam is regular but bradycardic.  2/6 systolic murmur Abdominal exam nontender or distended. No masses palpated. Extremities show no edema. neuro grossly intact  ECG-Marked sinus bradycardia at a rate of 44, left ventricular hypertrophy personally reviewed  A/P  1 history of atrial flutter-patient is in sinus rhythm today.  Continue apixaban.  Check hemoglobin and renal function.  She is bradycardic.  Discontinue metoprolol.  2 Hypertension-blood pressure is controlled.  Continue present medications.  3  palpitations-patient previously noted to have PACs and PVCs on monitor.  Discontinue metoprolol due to bradycardia.  4  chronic diastolic congestive heart failure-patient is euvolemic.  Continue present dose of lasix.  Check potassium and renal function.  5 prior moderate pericardial effusion-small when I reviewed the study.  We will repeat study to reassess.  6 hyperlipidemia-continue statin.  Kirk Ruths, MD

## 2019-04-24 ENCOUNTER — Ambulatory Visit
Admission: RE | Admit: 2019-04-24 | Discharge: 2019-04-24 | Disposition: A | Discharge status: Home / Self Care | Payer: Medicare Other | Source: Ambulatory Visit | Attending: Orthopedic Surgery | Admitting: Orthopedic Surgery

## 2019-04-24 DIAGNOSIS — M545 Low back pain, unspecified: Secondary | ICD-10-CM

## 2019-04-24 DIAGNOSIS — M25551 Pain in right hip: Secondary | ICD-10-CM

## 2019-04-25 ENCOUNTER — Other Ambulatory Visit: Payer: Self-pay | Admitting: Family Medicine

## 2019-04-30 ENCOUNTER — Other Ambulatory Visit: Payer: Self-pay

## 2019-04-30 ENCOUNTER — Ambulatory Visit (INDEPENDENT_AMBULATORY_CARE_PROVIDER_SITE_OTHER): Payer: Medicare Other | Admitting: Cardiology

## 2019-04-30 ENCOUNTER — Encounter: Payer: Self-pay | Admitting: Cardiology

## 2019-04-30 VITALS — BP 128/60 | HR 44 | Temp 96.0°F | Ht 61.0 in | Wt 114.0 lb

## 2019-04-30 DIAGNOSIS — I313 Pericardial effusion (noninflammatory): Secondary | ICD-10-CM

## 2019-04-30 DIAGNOSIS — I10 Essential (primary) hypertension: Secondary | ICD-10-CM | POA: Diagnosis not present

## 2019-04-30 DIAGNOSIS — I3139 Other pericardial effusion (noninflammatory): Secondary | ICD-10-CM

## 2019-04-30 DIAGNOSIS — I4892 Unspecified atrial flutter: Secondary | ICD-10-CM | POA: Diagnosis not present

## 2019-04-30 DIAGNOSIS — R002 Palpitations: Secondary | ICD-10-CM | POA: Diagnosis not present

## 2019-04-30 LAB — CBC
Hematocrit: 40.6 % (ref 34.0–46.6)
Hemoglobin: 13.7 g/dL (ref 11.1–15.9)
MCH: 29.2 pg (ref 26.6–33.0)
MCHC: 33.7 g/dL (ref 31.5–35.7)
MCV: 87 fL (ref 79–97)
Platelets: 183 10*3/uL (ref 150–450)
RBC: 4.69 x10E6/uL (ref 3.77–5.28)
RDW: 13.8 % (ref 11.7–15.4)
WBC: 5 10*3/uL (ref 3.4–10.8)

## 2019-04-30 LAB — BASIC METABOLIC PANEL
BUN/Creatinine Ratio: 27 (ref 12–28)
BUN: 23 mg/dL (ref 10–36)
CO2: 24 mmol/L (ref 20–29)
Calcium: 9 mg/dL (ref 8.7–10.3)
Chloride: 98 mmol/L (ref 96–106)
Creatinine, Ser: 0.86 mg/dL (ref 0.57–1.00)
GFR calc Af Amer: 68 mL/min/{1.73_m2} (ref >59)
GFR calc non Af Amer: 59 mL/min/{1.73_m2} — ABNORMAL LOW (ref >59)
Glucose: 107 mg/dL — ABNORMAL HIGH (ref 65–99)
Potassium: 4.6 mmol/L (ref 3.5–5.2)
Sodium: 138 mmol/L (ref 134–144)

## 2019-04-30 NOTE — Patient Instructions (Signed)
Medication Instructions:  STOP METOPROLOL  *If you need a refill on your cardiac medications before your next appointment, please call your pharmacy*  Lab Work: Your physician recommends that you HAVE LAB WORK TODAY  If you have labs (blood work) drawn today and your tests are completely normal, you will receive your results only by: Marland Kitchen MyChart Message (if you have MyChart) OR . A paper copy in the mail If you have any lab test that is abnormal or we need to change your treatment, we will call you to review the results.  Testing/Procedures: Your physician has requested that you have an echocardiogram. Echocardiography is a painless test that uses sound waves to create images of your heart. It provides your doctor with information about the size and shape of your heart and how well your heart's chambers and valves are working. This procedure takes approximately one hour. There are no restrictions for this procedure.[place]    Follow-Up: At [facility], you and your health needs are our priority.  As part of our continuing mission to provide you with exceptional heart care, we have created designated Provider Care Teams.  These Care Teams include your primary Cardiologist (physician) and Advanced Practice Providers (APPs -  Physician Assistants and Nurse Practitioners) who all work together to provide you with the care you need, when you need it.  Your next appointment:   6 month(s)  The format for your next appointment:   Either In Person or Virtual  Provider:   You may see Kirk Ruths, MD or one of the following Advanced Practice Providers on your designated Care Team:    Kerin Ransom, PA-C  [place], [place]  Coletta Memos, [place]

## 2019-05-01 ENCOUNTER — Encounter: Payer: Self-pay | Admitting: *Deleted

## 2019-05-03 ENCOUNTER — Other Ambulatory Visit: Payer: Self-pay | Admitting: Orthopedic Surgery

## 2019-05-03 ENCOUNTER — Other Ambulatory Visit (HOSPITAL_COMMUNITY): Payer: Self-pay | Admitting: Orthopedic Surgery

## 2019-05-03 DIAGNOSIS — G8929 Other chronic pain: Secondary | ICD-10-CM

## 2019-05-11 ENCOUNTER — Encounter (HOSPITAL_COMMUNITY): Payer: Self-pay

## 2019-05-11 ENCOUNTER — Encounter (HOSPITAL_COMMUNITY): Payer: Medicare Other

## 2019-05-15 ENCOUNTER — Ambulatory Visit (HOSPITAL_COMMUNITY): Payer: Medicare Other | Attending: Cardiology

## 2019-05-15 ENCOUNTER — Other Ambulatory Visit: Payer: Self-pay

## 2019-05-15 DIAGNOSIS — I313 Pericardial effusion (noninflammatory): Secondary | ICD-10-CM | POA: Insufficient documentation

## 2019-05-15 DIAGNOSIS — I119 Hypertensive heart disease without heart failure: Secondary | ICD-10-CM | POA: Insufficient documentation

## 2019-05-15 DIAGNOSIS — I4892 Unspecified atrial flutter: Secondary | ICD-10-CM | POA: Insufficient documentation

## 2019-05-15 DIAGNOSIS — I3139 Other pericardial effusion (noninflammatory): Secondary | ICD-10-CM

## 2019-05-15 DIAGNOSIS — I351 Nonrheumatic aortic (valve) insufficiency: Secondary | ICD-10-CM | POA: Diagnosis not present

## 2019-06-05 ENCOUNTER — Other Ambulatory Visit: Payer: Self-pay | Admitting: Family Medicine

## 2019-06-05 NOTE — Telephone Encounter (Signed)
[facility] Verified LR: 05-09-2019 Qty: 60 Pending appointment: No pending appt  Previous appt: 02-22-2019

## 2019-06-05 NOTE — Telephone Encounter (Signed)
Per narcotics policy needs visit every 3 months. Please get a visit scheduled sometime in next month and I will give 1x refill but we really need a more regular cadence and I would prefer narcotics refills to not be sent unless a visit is scheduled if possible in future (though I know Fontella Shan team did not send this)

## 2019-06-06 ENCOUNTER — Inpatient Hospital Stay (HOSPITAL_COMMUNITY)
Admission: EM | Admit: 2019-06-06 | Discharge: 2019-06-13 | DRG: 354 | Disposition: A | Discharge status: Nursing Home (Includes ICF) | Payer: Medicare Other | Source: Skilled Nursing Facility | Attending: Student | Admitting: Student

## 2019-06-06 ENCOUNTER — Other Ambulatory Visit: Payer: Self-pay

## 2019-06-06 ENCOUNTER — Emergency Department (HOSPITAL_COMMUNITY): Payer: Medicare Other

## 2019-06-06 ENCOUNTER — Encounter (HOSPITAL_COMMUNITY): Payer: Self-pay

## 2019-06-06 DIAGNOSIS — I959 Hypotension, unspecified: Secondary | ICD-10-CM | POA: Diagnosis present

## 2019-06-06 DIAGNOSIS — Z7901 Long term (current) use of anticoagulants: Secondary | ICD-10-CM

## 2019-06-06 DIAGNOSIS — Z79899 Other long term (current) drug therapy: Secondary | ICD-10-CM

## 2019-06-06 DIAGNOSIS — M1990 Unspecified osteoarthritis, unspecified site: Secondary | ICD-10-CM | POA: Diagnosis present

## 2019-06-06 DIAGNOSIS — Z880 Allergy status to penicillin: Secondary | ICD-10-CM

## 2019-06-06 DIAGNOSIS — G4700 Insomnia, unspecified: Secondary | ICD-10-CM | POA: Diagnosis present

## 2019-06-06 DIAGNOSIS — F329 Major depressive disorder, single episode, unspecified: Secondary | ICD-10-CM | POA: Diagnosis present

## 2019-06-06 DIAGNOSIS — I110 Hypertensive heart disease with heart failure: Secondary | ICD-10-CM | POA: Diagnosis present

## 2019-06-06 DIAGNOSIS — I10 Essential (primary) hypertension: Secondary | ICD-10-CM | POA: Diagnosis present

## 2019-06-06 DIAGNOSIS — K436 Other and unspecified ventral hernia with obstruction, without gangrene: Secondary | ICD-10-CM

## 2019-06-06 DIAGNOSIS — I4892 Unspecified atrial flutter: Secondary | ICD-10-CM | POA: Diagnosis present

## 2019-06-06 DIAGNOSIS — Z888 Allergy status to other drugs, medicaments and biological substances status: Secondary | ICD-10-CM

## 2019-06-06 DIAGNOSIS — Z9049 Acquired absence of other specified parts of digestive tract: Secondary | ICD-10-CM

## 2019-06-06 DIAGNOSIS — D6489 Other specified anemias: Secondary | ICD-10-CM | POA: Diagnosis not present

## 2019-06-06 DIAGNOSIS — G8929 Other chronic pain: Secondary | ICD-10-CM | POA: Diagnosis present

## 2019-06-06 DIAGNOSIS — I350 Nonrheumatic aortic (valve) stenosis: Secondary | ICD-10-CM | POA: Diagnosis present

## 2019-06-06 DIAGNOSIS — Z882 Allergy status to sulfonamides status: Secondary | ICD-10-CM

## 2019-06-06 DIAGNOSIS — R5381 Other malaise: Secondary | ICD-10-CM | POA: Diagnosis present

## 2019-06-06 DIAGNOSIS — K450 Other specified abdominal hernia with obstruction, without gangrene: Secondary | ICD-10-CM

## 2019-06-06 DIAGNOSIS — R330 Drug induced retention of urine: Secondary | ICD-10-CM | POA: Diagnosis not present

## 2019-06-06 DIAGNOSIS — K430 Incisional hernia with obstruction, without gangrene: Principal | ICD-10-CM | POA: Diagnosis present

## 2019-06-06 DIAGNOSIS — Z66 Do not resuscitate: Secondary | ICD-10-CM | POA: Diagnosis present

## 2019-06-06 DIAGNOSIS — E7800 Pure hypercholesterolemia, unspecified: Secondary | ICD-10-CM | POA: Diagnosis present

## 2019-06-06 DIAGNOSIS — Z853 Personal history of malignant neoplasm of breast: Secondary | ICD-10-CM

## 2019-06-06 DIAGNOSIS — Z96651 Presence of right artificial knee joint: Secondary | ICD-10-CM | POA: Diagnosis present

## 2019-06-06 DIAGNOSIS — Z96641 Presence of right artificial hip joint: Secondary | ICD-10-CM | POA: Diagnosis present

## 2019-06-06 DIAGNOSIS — Z20822 Contact with and (suspected) exposure to covid-19: Secondary | ICD-10-CM | POA: Diagnosis present

## 2019-06-06 DIAGNOSIS — Z79811 Long term (current) use of aromatase inhibitors: Secondary | ICD-10-CM

## 2019-06-06 DIAGNOSIS — R410 Disorientation, unspecified: Secondary | ICD-10-CM | POA: Diagnosis not present

## 2019-06-06 DIAGNOSIS — Z8249 Family history of ischemic heart disease and other diseases of the circulatory system: Secondary | ICD-10-CM

## 2019-06-06 DIAGNOSIS — K660 Peritoneal adhesions (postprocedural) (postinfection): Secondary | ICD-10-CM | POA: Diagnosis present

## 2019-06-06 DIAGNOSIS — Z9012 Acquired absence of left breast and nipple: Secondary | ICD-10-CM

## 2019-06-06 DIAGNOSIS — I4891 Unspecified atrial fibrillation: Secondary | ICD-10-CM | POA: Diagnosis present

## 2019-06-06 DIAGNOSIS — Z7984 Long term (current) use of oral hypoglycemic drugs: Secondary | ICD-10-CM

## 2019-06-06 DIAGNOSIS — T40605A Adverse effect of unspecified narcotics, initial encounter: Secondary | ICD-10-CM | POA: Diagnosis not present

## 2019-06-06 DIAGNOSIS — F419 Anxiety disorder, unspecified: Secondary | ICD-10-CM | POA: Diagnosis present

## 2019-06-06 DIAGNOSIS — I5032 Chronic diastolic (congestive) heart failure: Secondary | ICD-10-CM | POA: Diagnosis present

## 2019-06-06 LAB — CBC WITH DIFFERENTIAL/PLATELET
Abs Immature Granulocytes: 0.02 10*3/uL (ref 0.00–0.07)
Basophils Absolute: 0 10*3/uL (ref 0.0–0.1)
Basophils Relative: 0 %
Eosinophils Absolute: 0.1 10*3/uL (ref 0.0–0.5)
Eosinophils Relative: 1 %
HCT: 44.1 % (ref 36.0–46.0)
Hemoglobin: 14 g/dL (ref 12.0–15.0)
Immature Granulocytes: 0 %
Lymphocytes Relative: 16 %
Lymphs Abs: 1.2 10*3/uL (ref 0.7–4.0)
MCH: 28.3 pg (ref 26.0–34.0)
MCHC: 31.7 g/dL (ref 30.0–36.0)
MCV: 89.1 fL (ref 80.0–100.0)
Monocytes Absolute: 0.5 10*3/uL (ref 0.1–1.0)
Monocytes Relative: 6 %
Neutro Abs: 5.8 10*3/uL (ref 1.7–7.7)
Neutrophils Relative %: 77 %
Platelets: 205 10*3/uL (ref 150–400)
RBC: 4.95 MIL/uL (ref 3.87–5.11)
RDW: 13.9 % (ref 11.5–15.5)
WBC: 7.6 10*3/uL (ref 4.0–10.5)
nRBC: 0 % (ref 0.0–0.2)

## 2019-06-06 LAB — COMPREHENSIVE METABOLIC PANEL
ALT: 12 U/L (ref 0–44)
AST: 22 U/L (ref 15–41)
Albumin: 4.4 g/dL (ref 3.5–5.0)
Alkaline Phosphatase: 115 U/L (ref 38–126)
Anion gap: 11 (ref 5–15)
BUN: 26 mg/dL — ABNORMAL HIGH (ref 8–23)
CO2: 29 mmol/L (ref 22–32)
Calcium: 9.4 mg/dL (ref 8.9–10.3)
Chloride: 98 mmol/L (ref 98–111)
Creatinine, Ser: 0.77 mg/dL (ref 0.44–1.00)
GFR calc Af Amer: >60 mL/min (ref >60)
GFR calc non Af Amer: >60 mL/min (ref >60)
Glucose, Bld: 137 mg/dL — ABNORMAL HIGH (ref 70–99)
Potassium: 3.6 mmol/L (ref 3.5–5.1)
Sodium: 138 mmol/L (ref 135–145)
Total Bilirubin: 0.4 mg/dL (ref 0.3–1.2)
Total Protein: 8.2 g/dL — ABNORMAL HIGH (ref 6.5–8.1)

## 2019-06-06 MED ORDER — FENTANYL CITRATE (PF) 100 MCG/2ML IJ SOLN
25.0000 ug | Freq: Once | INTRAMUSCULAR | Status: AC
  Administered 2019-06-06: 25 ug via INTRAVENOUS
  Filled 2019-06-06: qty 2

## 2019-06-06 MED ORDER — ONDANSETRON HCL 4 MG/2ML IJ SOLN
4.0000 mg | Freq: Once | INTRAMUSCULAR | Status: AC
  Administered 2019-06-06: 4 mg via INTRAVENOUS
  Filled 2019-06-06: qty 2

## 2019-06-06 MED ORDER — FENTANYL CITRATE (PF) 100 MCG/2ML IJ SOLN
50.0000 ug | Freq: Once | INTRAMUSCULAR | Status: AC
  Administered 2019-06-06: 50 ug via INTRAVENOUS
  Filled 2019-06-06: qty 2

## 2019-06-06 MED ORDER — IOHEXOL 300 MG/ML  SOLN
100.0000 mL | Freq: Once | INTRAMUSCULAR | Status: AC | PRN
  Administered 2019-06-06: 75 mL via INTRAVENOUS

## 2019-06-06 MED ORDER — METOCLOPRAMIDE HCL 5 MG/ML IJ SOLN
10.0000 mg | Freq: Once | INTRAMUSCULAR | Status: AC
  Administered 2019-06-06: 10 mg via INTRAVENOUS
  Filled 2019-06-06: qty 2

## 2019-06-06 NOTE — ED Notes (Signed)
Pt transported to CT ?

## 2019-06-06 NOTE — ED Triage Notes (Signed)
Pt comes from a rehab facility from hip replacement Tonight the patients abdominal hernia was getting bigger and patient was feeling nauseated Pt has had the hernia for an unknown time but recently has been getting bigger

## 2019-06-06 NOTE — Assessment & Plan Note (Addendum)
Pt has incarcerated hernia. Hernia could not be reduced by ER provider.  General surgery has been asked to see pt.  Per RCRI(revised cardiac risk index), pt has class 2 risk(10.1% risk of cardiac event).  However, pt had a total hip arthroplasty in 01/2019 that required general anesthesia and pt tolerated this procedure without complications.  Would continue to hold her Eliquis until after her surgery.  Last dose of Eliquis was the morning of 06-06-2019.  Package insert of Eliquis states that Eliquis should be held at least 24 hours prior to low bleeding risk procedures.  At least 48 hours for high bleeding risk procedures.  Ultimately, General surgery will need to assess the risk of bleeding for incarcerated hernia repair.  Pt does want surgery for her hernia.

## 2019-06-06 NOTE — Assessment & Plan Note (Signed)
Hold Eliquis until after her ventral hernia surgery.

## 2019-06-06 NOTE — Assessment & Plan Note (Signed)
Stable

## 2019-06-06 NOTE — ED Provider Notes (Signed)
[facility] DEPT Provider Note   CSN: [id number] Arrival date & time: 06/06/19  2023     History No chief complaint on file.   Glenda [name] is a 86 y.o. female.  Patient is a 86 year old female with a history of aortic stenosis, atrial flutter on Eliquis, CHF, GERD, prior breast cancer who is presenting today with 2 hours of severe mid abdominal pain.  Patient states that she had several abdominal surgeries in the past with prior hernias but 2 hours ago the hernia became larger and harder and she has had severe nausea and pain ever since then.  She has not passed any gas or had any bowel movement since the pain started.  Otherwise she had was feeling her normal self earlier today.  Patient is currently in rehab because she recently had a hip replacement.  She denies any vomiting but has had severe nausea.  No fevers, cough or shortness of breath.  Patient has been fully vaccinated against Covid.   The history is provided by the patient.       Past Medical History:  Diagnosis Date  . Aortic stenosis    Echo 02/2011 showing mild AS with normal LV systolic function  . Arthritis    "lower back" (10/09/2015)  . Atrial flutter with rapid ventricular response ([place]) 08/19/2016   s/p successful DCCV on 08/20/16, continue eliquis  . Chronic bronchitis ([place])    "off and on; several years" (10/09/2015)  . Chronic diastolic CHF (congestive heart failure) ([place])    a. 12/2015 Echo: EF 55-60%, mild AI/MR, mildly dil LA.  Marland Kitchen Chronic lower back pain   . Complication of anesthesia    "they had trouble waking me up after colon resection"  . Confusion    Originally listed as TIA-pt denies this hx on 10/09/2015 "it was the [place] I was taking; they had thought I was having a st originally roke"  . DDD (degenerative disc disease), lumbar    severe facet dz and adv DDD MRI L spine 2009  . Diverticulosis   . Dysrhythmia    A-Fib  . Femur fracture, right ([place]) 10/18/2018   . GERD (gastroesophageal reflux disease)   . Hiatal hernia    hx  . Hypercholesterolemia   . Hypertension   . Insomnia   . OA (osteoarthritis) of knee   . Paroxysmal atrial flutter ([place])    a. 09/2015 s/p TEE/DCCV;  b. CHA2DS2VASc = 5-->Eliquis.  . Peripheral edema   . PVCs (premature ventricular contractions)   . Sacral fracture ([place]) 11/06/2012  . Vertigo 11/19/2012    Patient Active Problem List   Diagnosis Date Noted  . Bradycardia 02/20/2019  . Hypotension due to hypovolemia   . Status post total replacement of right hip 02/05/2019  . Osteoarthritis of right hip 02/01/2019  . Hyponatremia 10/19/2018  . Femur fracture, right ([place]) 10/18/2018  . Hip fracture ([place]) 10/18/2018  . Anxiety 08/08/2018  . Abdominal hernia 05/19/2018  . Chronic pain syndrome 02/09/2018  . Chronic narcotic use 08/11/2017  . Malignant neoplasm of upper-outer quadrant of left breast in female, estrogen receptor positive ([place]) 06/27/2017  . History of cardioversion 08/20/2016  . Atrial flutter with rapid ventricular response ([place]) 08/19/2016  . Chronic anticoagulation-Eliquis 02/04/2016  . Atrial flutter ([place])   . Shortness of breath 12/13/2014  . Palpitations 12/13/2014  . Constipation 11/06/2012  . Accelerated/malignant hypertension 09/14/2012  . Chronic diastolic HF (heart failure) ([place]) 07/11/2012  . Osteoarthritis 04/03/2012  .  Anemia 06/10/2011  . Essential hypertension 03/25/2011  . Insomnia 09/15/2010    Past Surgical History:  Procedure Laterality Date  . BREAST BIOPSY Left   . CARDIAC CATHETERIZATION  02/17/2001   MILD REGURGITATION. EF 60%  . CARDIOVERSION N/A 10/08/2015   Procedure: CARDIOVERSION;  Surgeon: Sanda Klein, MD;  Location: [facility];  Service: Cardiovascular;  Laterality: N/A;  . CARDIOVERSION N/A 08/20/2016   Procedure: Cardioversion;  Surgeon: Evans Lance, MD;  Location: [facility];  Service: Cardiovascular;  Laterality: N/A;  . CATARACT EXTRACTION  W/ INTRAOCULAR LENS  IMPLANT, BILATERAL Bilateral   . CESAREAN SECTION  1954  . COLECTOMY     related to "blockage"  . COLONOSCOPY    . EXCISIONAL HEMORRHOIDECTOMY    . HIP PINNING,CANNULATED Right 10/19/2018   Procedure: Right percutaneous hip pinning;  Surgeon: Erle Crocker, MD;  Location: [facility];  Service: Orthopedics;  Laterality: Right;  . INGUINAL HERNIA REPAIR Bilateral   . KYPHOPLASTY Right 09/11/2012   Procedure: Right Acrylic Sacroplasty;  Surgeon: Kristeen Miss, MD;  Location: [facility];  Service: Neurosurgery;  Laterality: Right;  Right  Acrylic Sacroplasty  . TEE WITHOUT CARDIOVERSION N/A 10/08/2015   Procedure: TRANSESOPHAGEAL ECHOCARDIOGRAM (TEE);  Surgeon: Sanda Klein, MD;  Location: [facility];  Service: Cardiovascular;  Laterality: N/A;  . TOTAL HIP ARTHROPLASTY Right 02/05/2019   Procedure: [place] TOTAL HIP ARTHROPLASTY ANTERIOR APPROACH AND REMOVAL OF SCREWS;  Surgeon: Frederik Pear, MD;  Location: [facility];  Service: Orthopedics;  Laterality: Right;  . TOTAL KNEE ARTHROPLASTY Right   . TOTAL MASTECTOMY Left 07/22/2017   Procedure: TOTAL MASTECTOMY;  Surgeon: Excell Seltzer, MD;  Location: [facility];  Service: General;  Laterality: Left;  . UMBILICAL HERNIA REPAIR       OB History   No obstetric history on file.     Family History  Problem Relation Age of Onset  . Heart disease Mother   . Hypertension Father   . Other Sister        34 in 2019  . Other Sister        3 in 2019  . Heart disease Sister   . Heart attack Neg Hx   . Stroke Neg Hx     Social History   Tobacco Use  . Smoking status: Never Smoker  . Smokeless tobacco: Never Used  Substance Use Topics  . Alcohol use: No  . Drug use: No    Home Medications Prior to Admission medications   Medication Sig Start Date End Date Taking? Authorizing Provider  anastrozole (ARIMIDEX) 1 MG tablet Take 1 tablet (1 mg total) by mouth daily. 10/10/18   Nicholas Lose, MD  apixaban (ELIQUIS) 2.5 MG  TABS tablet Take 1 tablet (2.5 mg total) by mouth 2 (two) times daily. 07/24/17   Greer Pickerel, MD  benzonatate (TESSALON) 100 MG capsule Take 100 mg by mouth every 8 (eight) hours as needed for cough.    [provider]  Cholecalciferol 1000 units tablet Take 1,000 Units by mouth daily.    [provider]  Cranberry 425 MG CAPS Take 425 mg by mouth 2 (two) times daily.    [provider]  docusate sodium (COLACE) 100 MG capsule Take 100 mg by mouth 2 (two) times daily.    [provider]  furosemide (LASIX) 20 MG tablet Take 1.5 tablets (30 mg total) by mouth daily. 08/26/16   Baldwin Jamaica, PA-C  HYDROcodone-acetaminophen (NORCO/VICODIN) 5-325 MG tablet Take 1  tablet by mouth 2 (two) times daily. At 9 Am and 3 PM. Separate by 6 hours from temazepam. Fill in 1 month 03/12/19   Marin Olp, MD  HYDROcodone-acetaminophen (NORCO/VICODIN) 5-325 MG tablet Take 1 tablet by mouth 2 (two) times daily. At 9 Am and 3 PM. Separate by 6 hours from temazepam. Fill today. 03/12/19   Marin Olp, MD  HYDROcodone-acetaminophen (NORCO/VICODIN) 5-325 MG tablet Take 1 tablet by mouth 2 (two) times daily. At 9 Am and 3 PM. Separate by 6 hours from temazepam. Fill in 2 months 03/12/19   Marin Olp, MD  Multiple Vitamins-Minerals (CEROVITE ADVANCED FORMULA PO) Take 1 tablet by mouth daily. (0800)    [provider]  omeprazole (PRILOSEC) 40 MG capsule Take 40 mg by mouth daily. 08/30/17   [provider]  ondansetron (ZOFRAN) 4 MG tablet Take 4 mg by mouth every 6 (six) hours as needed for nausea.    [provider]  polyethylene glycol (MIRALAX / GLYCOLAX) packet Take 17 g by mouth See admin instructions. Mix 17 grams in 8 ounces of juice/water and drink daily, Monday through Saturday    [provider]  sertraline (ZOLOFT) 25 MG tablet TAKE (1) TABLET BY MOUTH ONCE DAILY. Patient taking differently: Take 25 mg by mouth daily. TAKE  (1) TABLET BY MOUTH ONCE DAILY. 09/15/18   Marin Olp, MD  temazepam (RESTORIL) 15 MG capsule TAKE (1) CAPSULE BY MOUTH AT BEDTIME. SHOULD SEPARATE FROM HYDROCODONE BY AT LEAST 6 HOURS. Patient taking differently: Take 15 mg by mouth at bedtime. Should separate from hydrocodone by at least 6 hours 01/11/19   Marin Olp, MD  tiZANidine (ZANAFLEX) 2 MG tablet Take 1 tablet (2 mg total) by mouth every 6 (six) hours as needed. 02/05/19   Leighton Parody, PA-C    Allergies    Ace inhibitors, Halcion [triazolam], Pentazocine lactate, Sulfa drugs cross reactors, Trazodone and nefazodone, Amitriptyline, Avelox [moxifloxacin hcl in nacl], and Penicillins  Review of Systems   Review of Systems  All other systems reviewed and are negative.   Physical Exam Updated Vital Signs BP (!) 191/117 (BP Location: Left Arm) Comment: Simultaneous filing. User may not have seen previous data.  Pulse (!) 37 Comment: Simultaneous filing. User may not have seen previous data.  Temp 98.2 F (36.8 C) (Oral)   Resp 13 Comment: Simultaneous filing. User may not have seen previous data.  Ht 5\' 1"  (1.549 m)   Wt 52.2 kg   SpO2 96% Comment: Simultaneous filing. User may not have seen previous data.  BMI 21.73 kg/m   Physical Exam Vitals and nursing note reviewed.  Constitutional:      General: She is in acute distress.     Appearance: She is well-developed and normal weight.  HENT:     Head: Normocephalic and atraumatic.  Eyes:     Pupils: Pupils are equal, round, and reactive to light.  Cardiovascular:     Rate and Rhythm: Normal rate and regular rhythm.     Heart sounds: Normal heart sounds. No murmur. No friction rub.  Pulmonary:     Effort: Pulmonary effort is normal.     Breath sounds: Normal breath sounds. No wheezing or rales.  Abdominal:     General: Bowel sounds are normal. There is no distension.     Palpations: Abdomen is soft.     Tenderness: There is abdominal tenderness. There  is guarding. There is no rebound.  Hernia: A hernia is present.    Musculoskeletal:        General: No tenderness. Normal range of motion.     Comments: No edema  Skin:    General: Skin is warm and dry.     Findings: No rash.  Neurological:     General: No focal deficit present.     Mental Status: She is alert and oriented to person, place, and time. Mental status is at baseline.     Cranial Nerves: No cranial nerve deficit.  Psychiatric:        Mood and Affect: Mood normal.        Behavior: Behavior normal.        Thought Content: Thought content normal.     ED Results / Procedures / Treatments   Labs (all labs ordered are listed, but only abnormal results are displayed) Labs Reviewed  COMPREHENSIVE METABOLIC PANEL - Abnormal; Notable for the following components:      Result Value   Glucose, Bld 137 (*)    BUN 26 (*)    Total Protein 8.2 (*)    All other components within normal limits  RESPIRATORY PANEL BY RT PCR (FLU A&B, COVID)  CBC WITH DIFFERENTIAL/PLATELET    EKG EKG Interpretation  Date/Time:  Wednesday June 06 2019 21:14:33 EDT Ventricular Rate:  67 PR Interval:    QRS Duration: 94 QT Interval:  466 QTC Calculation: 450 R Axis:   -31 Text Interpretation: Atrial fibrillation LVH w/ repol abnormalities, possible ischemia Artifact No significant change since last tracing Confirmed by Blanchie Dessert [phone]) on 06/06/2019 9:22:53 PM   Radiology CT ABDOMEN PELVIS W CONTRAST  Result Date: 06/06/2019 CLINICAL DATA:  86 year old female with complicated hernia. EXAM: CT ABDOMEN AND PELVIS WITH CONTRAST TECHNIQUE: Multidetector CT imaging of the abdomen and pelvis was performed using the standard protocol following bolus administration of intravenous contrast. CONTRAST:  65mL OMNIPAQUE IOHEXOL 300 MG/ML  SOLN COMPARISON:  None. FINDINGS: Lower chest: The visualized lung bases are clear. There is moderate cardiomegaly. Partial calcification of the aortic  annulus. No intra-abdominal free air or free fluid. Hepatobiliary: The liver is unremarkable. Mild intrahepatic biliary ductal dilatation. The gallbladder is unremarkable. Pancreas: Unremarkable. No pancreatic ductal dilatation or surrounding inflammatory changes. Spleen: Normal in size without focal abnormality. Adrenals/Urinary Tract: The adrenal glands are unremarkable. There is no hydronephrosis on either side. There is symmetric enhancement and excretion of contrast by both kidneys. The urinary bladder is grossly unremarkable. Stomach/Bowel: There is moderate stool throughout the colon. There are scattered colonic diverticula without active inflammatory changes. There is a ventral infraumbilical hernia containing a segment of small bowel. The neck of the hernia defect measures approximately 12 mm. There is pinching of the bowel at the neck of the hernia with mild dilatation herniated segment and segment of the small bowel proximal to the hernia. The small bowel proximal to the hernia measures approximately 3 cm in diameter. There is mild associated inflammatory changes. There is a small hiatal hernia. The appendix is not visualized with certainty. No inflammatory changes identified in the right lower quadrant. Vascular/Lymphatic: Moderate aortoiliac atherosclerotic disease. The IVC is unremarkable. No portal venous gas. There is no adenopathy. Reproductive: The uterus is grossly unremarkable. No adnexal masses. Other: None Musculoskeletal: Osteopenia with degenerative changes of the spine and multilevel disc desiccation and vacuum phenomena. There is a total right hip arthroplasty. Vertebroplasty changes of the sacrum. No acute osseous pathology. IMPRESSION: 1. Ventral infraumbilical hernia containing a segment of  small bowel with associated mild or early obstruction. 2. Colonic diverticulosis. No active inflammatory changes. 3. Aortic Atherosclerosis (ICD10-I70.0). Electronically Signed   By: Anner Crete  M.D.   On: 06/06/2019 22:46    Procedures Procedures (including critical care time)  Medications Ordered in ED Medications  ondansetron (ZOFRAN) injection 4 mg (has no administration in time range)  fentaNYL (SUBLIMAZE) injection 25 mcg (has no administration in time range)    ED Course  I have reviewed the triage vital signs and the nursing notes.  Pertinent labs & imaging results that were available during my care of the patient were reviewed by me and considered in my medical decision making (see chart for details).    MDM Rules/Calculators/A&P                     Elderly female who is on anticoagulation presenting today with 2 hours of persistent severe abdominal pain and nausea.  Patient appears to have an incarcerated hernia from a ventral hernia that has been present for some time after multiple abdominal surgeries.  Patient is nauseated but no vomiting at this time.  She was giving pain and nausea medication.  Labs are pending.  CT was ordered but will consult general surgery early.  9:28 PM Spoke with Dr. Marcello Moores who recommended preceding with work up.  11:13 PM CT shows a ventral infraumbilical hernia containing a segment of small bowel with associated mild or early obstruction. Patient continues to have pain and nausea. Call Dr. Marcello Moores back to let her know the results so she could come and see the patient. She requested medicine see the patient however speaking with medicine they feel that patient has a surgical emergency that needs to go to surgery but they will consult on the patient.  Final Clinical Impression(s) / ED Diagnoses Final diagnoses:  None    Rx / DC Orders ED Discharge Orders    None       Blanchie Dessert, MD 06/06/19 2314

## 2019-06-06 NOTE — Subjective & Objective (Signed)
Requesting MD: Maryan Rued Reason for Consult: incarcerated ventral hernia  HPI: 86 year old female with a history of atrial flutter, chronic anticoagulation with Eliquis last dose was the morning of 06/06/2019, hypertension, recent total hip replacement after hip fracture who is currently convalescing at a local nursing home ([facility]) presents to the ER this evening with a sudden onset of nausea, abdominal pain.  Patient has a known history of ventral hernia.   Ventral hernia was not reducible by the ER provider.  CT scan demonstrates ventral hernia with associated mild early up obstruction.  [facility] hospitalist consulted for medical management.  Patient without any chest pain or shortness of breath.  Patient is concerned about having surgery.  She has done well with her total hip arthroplasty less than 4 months ago.

## 2019-06-06 NOTE — ED Notes (Signed)
Pt requesting pain and nausea medication. Provider notified

## 2019-06-06 NOTE — Telephone Encounter (Signed)
Unable to LVM for the patient to schedule appt.

## 2019-06-06 NOTE — Consult Note (Signed)
CC: abd pain Consulting MD: Dr Maryan Rued    HPI: Glenda [name] is an 86 y.o. female who is here for worsening abd pain over the afternoon and evening.  She states that she has had a known hernia for several years.  She has a h/o of multiple abd surgeries including colectomy and incisional hernia repair.  She takes Elliquis for A fib.  Last dose was this am (3/17).  Past Medical History:  Diagnosis Date   Aortic stenosis    Echo 02/2011 showing mild AS with normal LV systolic function   Arthritis    "lower back" (10/09/2015)   Atrial flutter with rapid ventricular response ([place]) 08/19/2016   s/p successful DCCV on 08/20/16, continue eliquis   Chronic bronchitis ([place])    "off and on; several years" (10/09/2015)   Chronic diastolic CHF (congestive heart failure) ([place])    a. 12/2015 Echo: EF 55-60%, mild AI/MR, mildly dil LA.   Chronic lower back pain    Complication of anesthesia    "they had trouble waking me up after colon resection"   Confusion    Originally listed as TIA-pt denies this hx on 10/09/2015 "it was the [place] I was taking; they had thought I was having a st originally roke"   DDD (degenerative disc disease), lumbar    severe facet dz and adv DDD MRI L spine 2009   Diverticulosis    Dysrhythmia    A-Fib   Femur fracture, right ([place]) 10/18/2018   GERD (gastroesophageal reflux disease)    Hiatal hernia    hx   Hypercholesterolemia    Hypertension    Insomnia    OA (osteoarthritis) of knee    Paroxysmal atrial flutter ([employer])    a. 09/2015 s/p TEE/DCCV;  b. CHA2DS2VASc = 5-->Eliquis.   Peripheral edema    PVCs (premature ventricular contractions)    Sacral fracture ([place]) 11/06/2012   Vertigo 11/19/2012    Past Surgical History:  Procedure Laterality Date   BREAST BIOPSY Left    CARDIAC CATHETERIZATION  02/17/2001   MILD REGURGITATION. EF 60%   CARDIOVERSION N/A 10/08/2015   Procedure: CARDIOVERSION;  Surgeon: Sanda Klein, MD;  Location: [facility];  Service:  Cardiovascular;  Laterality: N/A;   CARDIOVERSION N/A 08/20/2016   Procedure: Cardioversion;  Surgeon: Evans Lance, MD;  Location: [facility];  Service: Cardiovascular;  Laterality: N/A;   CATARACT EXTRACTION W/ INTRAOCULAR LENS  IMPLANT, BILATERAL Bilateral    CESAREAN SECTION  1954   COLECTOMY     related to "blockage"   COLONOSCOPY     EXCISIONAL HEMORRHOIDECTOMY     HIP PINNING,CANNULATED Right 10/19/2018   Procedure: Right percutaneous hip pinning;  Surgeon: Erle Crocker, MD;  Location: [facility];  Service: Orthopedics;  Laterality: Right;   INGUINAL HERNIA REPAIR Bilateral    KYPHOPLASTY Right 09/11/2012   Procedure: Right Acrylic Sacroplasty;  Surgeon: Kristeen Miss, MD;  Location: [facility];  Service: Neurosurgery;  Laterality: Right;  Right  Acrylic Sacroplasty   TEE WITHOUT CARDIOVERSION N/A 10/08/2015   Procedure: TRANSESOPHAGEAL ECHOCARDIOGRAM (TEE);  Surgeon: Sanda Klein, MD;  Location: [facility];  Service: Cardiovascular;  Laterality: N/A;   TOTAL HIP ARTHROPLASTY Right 02/05/2019   Procedure: [place] TOTAL HIP ARTHROPLASTY ANTERIOR APPROACH AND REMOVAL OF SCREWS;  Surgeon: Frederik Pear, MD;  Location: [facility];  Service: Orthopedics;  Laterality: Right;   TOTAL KNEE ARTHROPLASTY Right    TOTAL MASTECTOMY Left 07/22/2017   Procedure: TOTAL MASTECTOMY;  Surgeon: Excell Seltzer,  Marland Kitchen, MD;  Location: [facility];  Service: General;  Laterality: Left;   UMBILICAL HERNIA REPAIR      Family History  Problem Relation Age of Onset   Heart disease Mother    Hypertension Father    Other Sister        41 in 2019   Other Sister        48 in 2019   Heart disease Sister    Heart attack Neg Hx    Stroke Neg Hx     Social:  reports that she has never smoked. She has never used smokeless tobacco. She reports that she does not drink alcohol or use drugs.  Allergies:  Allergies  Allergen Reactions   Ace Inhibitors Other (See Comments)    Cough   Halcion [Triazolam]      UNSPECIFIED REACTION    Pentazocine Lactate     UNSPECIFIED REACTION    Sulfa Drugs Cross Reactors     UNSPECIFIED REACTION    Trazodone And Nefazodone Other (See Comments)    Per MAR   Amitriptyline Other (See Comments)    Hyperactivity   Avelox [Moxifloxacin Hcl In [place]] Other (See Comments)    dizziness   Penicillins Rash    Medications: I have reviewed the patient's current medications.  Results for orders placed or performed during the hospital encounter of 06/06/19 (from the past 48 hour(s))  CBC with Differential/Platelet     Status: None   Collection Time: 06/06/19  9:02 PM  Result Value Ref Range   WBC 7.6 4.0 - 10.5 K/uL   RBC 4.95 3.87 - 5.11 MIL/uL   Hemoglobin 14.0 12.0 - 15.0 g/dL   HCT 44.1 36.0 - 46.0 %   MCV 89.1 80.0 - 100.0 fL   MCH 28.3 26.0 - 34.0 pg   MCHC 31.7 30.0 - 36.0 g/dL   RDW 13.9 11.5 - 15.5 %   Platelets 205 150 - 400 K/uL   nRBC 0.0 0.0 - 0.2 %   Neutrophils Relative % 77 %   Neutro Abs 5.8 1.7 - 7.7 K/uL   Lymphocytes Relative 16 %   Lymphs Abs 1.2 0.7 - 4.0 K/uL   Monocytes Relative 6 %   Monocytes Absolute 0.5 0.1 - 1.0 K/uL   Eosinophils Relative 1 %   Eosinophils Absolute 0.1 0.0 - 0.5 K/uL   Basophils Relative 0 %   Basophils Absolute 0.0 0.0 - 0.1 K/uL   Immature Granulocytes 0 %   Abs Immature Granulocytes 0.02 0.00 - 0.07 K/uL    Comment: Performed at [facility], [place] [address] [place], [place] [address]  Comprehensive metabolic panel     Status: Abnormal   Collection Time: 06/06/19  9:02 PM  Result Value Ref Range   Sodium 138 135 - 145 mmol/L   Potassium 3.6 3.5 - 5.1 mmol/L   Chloride 98 98 - 111 mmol/L   CO2 29 22 - 32 mmol/L   Glucose, Bld 137 (H) 70 - 99 mg/dL    Comment: Glucose reference range applies only to samples taken after fasting for at least 8 hours.   BUN 26 (H) 8 - 23 mg/dL   Creatinine, Ser 0.77 0.44 - 1.00 mg/dL   Calcium 9.4 8.9 - 10.3 mg/dL   Total Protein 8.2 (H) 6.5 - 8.1  g/dL   Albumin 4.4 3.5 - 5.0 g/dL   AST 22 15 - 41 U/L   ALT 12 0 - 44 U/L   Alkaline  Phosphatase 115 38 - 126 U/L   Total Bilirubin 0.4 0.3 - 1.2 mg/dL   GFR calc non Af Amer >60 >60 mL/min   GFR calc Af Amer >60 >60 mL/min   Anion gap 11 5 - 15    Comment: Performed at [facility], [place] [address], [place], [place] [address]    No results found.  ROS - all of the below systems have been reviewed with the patient and positives are indicated with bold text General: chills, fever or night sweats Eyes: blurry vision or double vision ENT: epistaxis or sore throat Allergy/Immunology: itchy/watery eyes or nasal congestion Hematologic/Lymphatic: bleeding problems, blood clots or swollen lymph nodes Endocrine: temperature intolerance or unexpected weight changes Breast: new or changing breast lumps or nipple discharge Resp: cough, shortness of breath, or wheezing CV: chest pain or dyspnea on exertion GI: as per HPI GU: dysuria, trouble voiding, or hematuria MSK: joint pain or joint stiffness Neuro: TIA or stroke symptoms Derm: pruritus and skin lesion changes Psych: anxiety and depression  PE Blood pressure (!) 191/117, pulse (!) 37, temperature 98.2 F (36.8 C), temperature source Oral, resp. rate 13, height 5\' 1"  (1.549 m), weight 52.2 kg, SpO2 96 %. Constitutional: NAD; conversant; no deformities Eyes: Moist conjunctiva; no lid lag; anicteric; PERRL Neck: Trachea midline; no thyromegaly Lungs: Normal respiratory effort; no tactile fremitus CV: RRR; no palpable thrills; no pitting edema GI: Abd mass/hernia at lower midline, nontender;  Non-reducible. no palpable hepatosplenomegaly MSK: Normal range of motion of extremities; no clubbing/cyanosis Psychiatric: Appropriate affect; alert and oriented x3 Lymphatic: No palpable cervical or axillary lymphadenopathy  Results for orders placed or performed during the hospital encounter of 06/06/19 (from the past 48  hour(s))  CBC with Differential/Platelet     Status: None   Collection Time: 06/06/19  9:02 PM  Result Value Ref Range   WBC 7.6 4.0 - 10.5 K/uL   RBC 4.95 3.87 - 5.11 MIL/uL   Hemoglobin 14.0 12.0 - 15.0 g/dL   HCT 44.1 36.0 - 46.0 %   MCV 89.1 80.0 - 100.0 fL   MCH 28.3 26.0 - 34.0 pg   MCHC 31.7 30.0 - 36.0 g/dL   RDW 13.9 11.5 - 15.5 %   Platelets 205 150 - 400 K/uL   nRBC 0.0 0.0 - 0.2 %   Neutrophils Relative % 77 %   Neutro Abs 5.8 1.7 - 7.7 K/uL   Lymphocytes Relative 16 %   Lymphs Abs 1.2 0.7 - 4.0 K/uL   Monocytes Relative 6 %   Monocytes Absolute 0.5 0.1 - 1.0 K/uL   Eosinophils Relative 1 %   Eosinophils Absolute 0.1 0.0 - 0.5 K/uL   Basophils Relative 0 %   Basophils Absolute 0.0 0.0 - 0.1 K/uL   Immature Granulocytes 0 %   Abs Immature Granulocytes 0.02 0.00 - 0.07 K/uL    Comment: Performed at [facility], [place] [address] [place], [place] [address]  Comprehensive metabolic panel     Status: Abnormal   Collection Time: 06/06/19  9:02 PM  Result Value Ref Range   Sodium 138 135 - 145 mmol/L   Potassium 3.6 3.5 - 5.1 mmol/L   Chloride 98 98 - 111 mmol/L   CO2 29 22 - 32 mmol/L   Glucose, Bld 137 (H) 70 - 99 mg/dL    Comment: Glucose reference range applies only to samples taken after fasting for at least 8 hours.   BUN 26 (H) 8 -  23 mg/dL   Creatinine, Ser 0.77 0.44 - 1.00 mg/dL   Calcium 9.4 8.9 - 10.3 mg/dL   Total Protein 8.2 (H) 6.5 - 8.1 g/dL   Albumin 4.4 3.5 - 5.0 g/dL   AST 22 15 - 41 U/L   ALT 12 0 - 44 U/L   Alkaline Phosphatase 115 38 - 126 U/L   Total Bilirubin 0.4 0.3 - 1.2 mg/dL   GFR calc non Af Amer >60 >60 mL/min   GFR calc Af Amer >60 >60 mL/min   Anion gap 11 5 - 15    Comment: Performed at [facility], [place] [address], [place], [place] [address]    No results found.   A/P: CAMRON DOLAND is an 86 y.o. female with an incarcerated incisional hernia without signs of strangulation.   NPO, IVF.   Pain and nausea control  Rosario Adie, M.D. [facility] Use [email or web address] to contact on call provider

## 2019-06-07 ENCOUNTER — Inpatient Hospital Stay (HOSPITAL_COMMUNITY): Payer: Medicare Other | Admitting: Certified Registered Nurse Anesthetist

## 2019-06-07 ENCOUNTER — Encounter (HOSPITAL_COMMUNITY): Payer: Self-pay | Admitting: Internal Medicine

## 2019-06-07 ENCOUNTER — Inpatient Hospital Stay (HOSPITAL_COMMUNITY): Payer: Medicare Other

## 2019-06-07 ENCOUNTER — Encounter (HOSPITAL_COMMUNITY)
Admission: EM | Disposition: A | Discharge status: Nursing Home (Includes ICF) | Payer: Self-pay | Source: Skilled Nursing Facility | Attending: Student

## 2019-06-07 DIAGNOSIS — E7800 Pure hypercholesterolemia, unspecified: Secondary | ICD-10-CM | POA: Diagnosis present

## 2019-06-07 DIAGNOSIS — I959 Hypotension, unspecified: Secondary | ICD-10-CM | POA: Diagnosis present

## 2019-06-07 DIAGNOSIS — R5381 Other malaise: Secondary | ICD-10-CM | POA: Diagnosis present

## 2019-06-07 DIAGNOSIS — I4891 Unspecified atrial fibrillation: Secondary | ICD-10-CM | POA: Diagnosis present

## 2019-06-07 DIAGNOSIS — Z7901 Long term (current) use of anticoagulants: Secondary | ICD-10-CM

## 2019-06-07 DIAGNOSIS — I5032 Chronic diastolic (congestive) heart failure: Secondary | ICD-10-CM | POA: Diagnosis present

## 2019-06-07 DIAGNOSIS — Z20822 Contact with and (suspected) exposure to covid-19: Secondary | ICD-10-CM | POA: Diagnosis present

## 2019-06-07 DIAGNOSIS — Z888 Allergy status to other drugs, medicaments and biological substances status: Secondary | ICD-10-CM | POA: Diagnosis not present

## 2019-06-07 DIAGNOSIS — M1990 Unspecified osteoarthritis, unspecified site: Secondary | ICD-10-CM | POA: Diagnosis present

## 2019-06-07 DIAGNOSIS — T40605A Adverse effect of unspecified narcotics, initial encounter: Secondary | ICD-10-CM | POA: Diagnosis not present

## 2019-06-07 DIAGNOSIS — K430 Incisional hernia with obstruction, without gangrene: Secondary | ICD-10-CM | POA: Diagnosis present

## 2019-06-07 DIAGNOSIS — Z880 Allergy status to penicillin: Secondary | ICD-10-CM | POA: Diagnosis not present

## 2019-06-07 DIAGNOSIS — I483 Typical atrial flutter: Secondary | ICD-10-CM

## 2019-06-07 DIAGNOSIS — K660 Peritoneal adhesions (postprocedural) (postinfection): Secondary | ICD-10-CM | POA: Diagnosis present

## 2019-06-07 DIAGNOSIS — F419 Anxiety disorder, unspecified: Secondary | ICD-10-CM | POA: Diagnosis present

## 2019-06-07 DIAGNOSIS — R330 Drug induced retention of urine: Secondary | ICD-10-CM | POA: Diagnosis not present

## 2019-06-07 DIAGNOSIS — G4700 Insomnia, unspecified: Secondary | ICD-10-CM | POA: Diagnosis present

## 2019-06-07 DIAGNOSIS — Z66 Do not resuscitate: Secondary | ICD-10-CM | POA: Diagnosis present

## 2019-06-07 DIAGNOSIS — G8929 Other chronic pain: Secondary | ICD-10-CM | POA: Diagnosis present

## 2019-06-07 DIAGNOSIS — R410 Disorientation, unspecified: Secondary | ICD-10-CM | POA: Diagnosis not present

## 2019-06-07 DIAGNOSIS — F329 Major depressive disorder, single episode, unspecified: Secondary | ICD-10-CM | POA: Diagnosis present

## 2019-06-07 DIAGNOSIS — Z882 Allergy status to sulfonamides status: Secondary | ICD-10-CM | POA: Diagnosis not present

## 2019-06-07 DIAGNOSIS — K436 Other and unspecified ventral hernia with obstruction, without gangrene: Secondary | ICD-10-CM

## 2019-06-07 DIAGNOSIS — Z96641 Presence of right artificial hip joint: Secondary | ICD-10-CM

## 2019-06-07 DIAGNOSIS — I10 Essential (primary) hypertension: Secondary | ICD-10-CM

## 2019-06-07 DIAGNOSIS — G894 Chronic pain syndrome: Secondary | ICD-10-CM

## 2019-06-07 DIAGNOSIS — I110 Hypertensive heart disease with heart failure: Secondary | ICD-10-CM | POA: Diagnosis present

## 2019-06-07 DIAGNOSIS — I350 Nonrheumatic aortic (valve) stenosis: Secondary | ICD-10-CM | POA: Diagnosis present

## 2019-06-07 DIAGNOSIS — D6489 Other specified anemias: Secondary | ICD-10-CM | POA: Diagnosis not present

## 2019-06-07 DIAGNOSIS — I4892 Unspecified atrial flutter: Secondary | ICD-10-CM | POA: Diagnosis present

## 2019-06-07 HISTORY — PX: INCISIONAL HERNIA REPAIR: SHX193

## 2019-06-07 LAB — CBC WITH DIFFERENTIAL/PLATELET
Abs Immature Granulocytes: 0.02 10*3/uL (ref 0.00–0.07)
Basophils Absolute: 0 10*3/uL (ref 0.0–0.1)
Basophils Relative: 0 %
Eosinophils Absolute: 0 10*3/uL (ref 0.0–0.5)
Eosinophils Relative: 0 %
HCT: 40.7 % (ref 36.0–46.0)
Hemoglobin: 13.2 g/dL (ref 12.0–15.0)
Immature Granulocytes: 0 %
Lymphocytes Relative: 13 %
Lymphs Abs: 1 10*3/uL (ref 0.7–4.0)
MCH: 28.5 pg (ref 26.0–34.0)
MCHC: 32.4 g/dL (ref 30.0–36.0)
MCV: 87.9 fL (ref 80.0–100.0)
Monocytes Absolute: 0.5 10*3/uL (ref 0.1–1.0)
Monocytes Relative: 7 %
Neutro Abs: 5.9 10*3/uL (ref 1.7–7.7)
Neutrophils Relative %: 80 %
Platelets: 219 10*3/uL (ref 150–400)
RBC: 4.63 MIL/uL (ref 3.87–5.11)
RDW: 14 % (ref 11.5–15.5)
WBC: 7.4 10*3/uL (ref 4.0–10.5)
nRBC: 0 % (ref 0.0–0.2)

## 2019-06-07 LAB — TYPE AND SCREEN
ABO/RH(D): O NEG
Antibody Screen: NEGATIVE

## 2019-06-07 LAB — PROTIME-INR
INR: 1.2 (ref 0.8–1.2)
Prothrombin Time: 14.9 seconds (ref 11.4–15.2)

## 2019-06-07 LAB — SURGICAL PCR SCREEN
MRSA, PCR: NEGATIVE
Staphylococcus aureus: NEGATIVE

## 2019-06-07 LAB — RESPIRATORY PANEL BY RT PCR (FLU A&B, COVID)
Influenza A by PCR: NEGATIVE
Influenza B by PCR: NEGATIVE
SARS Coronavirus 2 by RT PCR: NEGATIVE

## 2019-06-07 SURGERY — REPAIR, HERNIA, INCISIONAL
Anesthesia: General | Site: Abdomen

## 2019-06-07 MED ORDER — PROPOFOL 10 MG/ML IV BOLUS
INTRAVENOUS | Status: AC
  Filled 2019-06-07: qty 20

## 2019-06-07 MED ORDER — ROCURONIUM BROMIDE 10 MG/ML (PF) SYRINGE
PREFILLED_SYRINGE | INTRAVENOUS | Status: AC
  Filled 2019-06-07: qty 10

## 2019-06-07 MED ORDER — STERILE WATER FOR IRRIGATION IR SOLN
Status: DC | PRN
  Administered 2019-06-07: 1000 mL

## 2019-06-07 MED ORDER — PHENYLEPHRINE 40 MCG/ML (10ML) SYRINGE FOR IV PUSH (FOR BLOOD PRESSURE SUPPORT)
PREFILLED_SYRINGE | INTRAVENOUS | Status: DC | PRN
  Administered 2019-06-07: 40 ug via INTRAVENOUS
  Administered 2019-06-07: 120 ug via INTRAVENOUS

## 2019-06-07 MED ORDER — ROCURONIUM BROMIDE 10 MG/ML (PF) SYRINGE
PREFILLED_SYRINGE | INTRAVENOUS | Status: DC | PRN
  Administered 2019-06-07: 40 mg via INTRAVENOUS
  Administered 2019-06-07: 10 mg via INTRAVENOUS

## 2019-06-07 MED ORDER — METRONIDAZOLE IN NACL 5-0.79 MG/ML-% IV SOLN
500.0000 mg | Freq: Three times a day (TID) | INTRAVENOUS | Status: AC
  Administered 2019-06-07: 500 mg via INTRAVENOUS
  Filled 2019-06-07: qty 100

## 2019-06-07 MED ORDER — MORPHINE SULFATE (PF) 2 MG/ML IV SOLN
1.0000 mg | INTRAVENOUS | Status: DC | PRN
  Administered 2019-06-08 – 2019-06-09 (×2): 2 mg via INTRAVENOUS
  Filled 2019-06-07 (×2): qty 1

## 2019-06-07 MED ORDER — BUPIVACAINE HCL (PF) 0.25 % IJ SOLN
INTRAMUSCULAR | Status: AC
  Filled 2019-06-07: qty 30

## 2019-06-07 MED ORDER — HEPARIN SODIUM (PORCINE) 5000 UNIT/ML IJ SOLN
5000.0000 [IU] | Freq: Three times a day (TID) | INTRAMUSCULAR | Status: DC
  Administered 2019-06-07 – 2019-06-11 (×13): 5000 [IU] via SUBCUTANEOUS
  Filled 2019-06-07 (×14): qty 1

## 2019-06-07 MED ORDER — ONDANSETRON HCL 4 MG/2ML IJ SOLN
INTRAMUSCULAR | Status: AC
  Filled 2019-06-07: qty 2

## 2019-06-07 MED ORDER — LACTATED RINGERS IV SOLN: INTRAVENOUS | Status: DC

## 2019-06-07 MED ORDER — FENTANYL CITRATE (PF) 250 MCG/5ML IJ SOLN
INTRAMUSCULAR | Status: AC
  Filled 2019-06-07: qty 5

## 2019-06-07 MED ORDER — ONDANSETRON HCL 4 MG/2ML IJ SOLN
INTRAMUSCULAR | Status: DC | PRN
  Administered 2019-06-07: 4 mg via INTRAVENOUS

## 2019-06-07 MED ORDER — LIDOCAINE 2% (20 MG/ML) 5 ML SYRINGE
INTRAMUSCULAR | Status: DC | PRN
  Administered 2019-06-07: 40 mg via INTRAVENOUS

## 2019-06-07 MED ORDER — MORPHINE SULFATE (PF) 2 MG/ML IV SOLN
2.0000 mg | INTRAVENOUS | Status: DC | PRN
  Administered 2019-06-07 (×2): 2 mg via INTRAVENOUS
  Filled 2019-06-07 (×2): qty 1

## 2019-06-07 MED ORDER — FENTANYL CITRATE (PF) 250 MCG/5ML IJ SOLN
INTRAMUSCULAR | Status: DC | PRN
  Administered 2019-06-07 (×3): 50 ug via INTRAVENOUS

## 2019-06-07 MED ORDER — SUGAMMADEX SODIUM 200 MG/2ML IV SOLN
INTRAVENOUS | Status: DC | PRN
  Administered 2019-06-07: 100 mg via INTRAVENOUS

## 2019-06-07 MED ORDER — WHITE PETROLATUM EX OINT
TOPICAL_OINTMENT | CUTANEOUS | Status: DC | PRN
  Filled 2019-06-07: qty 28.35

## 2019-06-07 MED ORDER — DEXAMETHASONE SODIUM PHOSPHATE 10 MG/ML IJ SOLN
INTRAMUSCULAR | Status: AC
  Filled 2019-06-07: qty 2

## 2019-06-07 MED ORDER — ACETAMINOPHEN 325 MG PO TABS: 650.0000 mg | ORAL_TABLET | Freq: Four times a day (QID) | ORAL | Status: DC | PRN

## 2019-06-07 MED ORDER — PROMETHAZINE HCL 25 MG/ML IJ SOLN
12.5000 mg | Freq: Three times a day (TID) | INTRAMUSCULAR | Status: DC | PRN
  Administered 2019-06-07: 12.5 mg via INTRAVENOUS
  Filled 2019-06-07: qty 1

## 2019-06-07 MED ORDER — ONDANSETRON HCL 4 MG PO TABS: 4.0000 mg | ORAL_TABLET | Freq: Four times a day (QID) | ORAL | Status: DC | PRN

## 2019-06-07 MED ORDER — 0.9 % SODIUM CHLORIDE (POUR BTL) OPTIME
TOPICAL | Status: DC | PRN
  Administered 2019-06-07 (×2): 1000 mL

## 2019-06-07 MED ORDER — DEXAMETHASONE SODIUM PHOSPHATE 10 MG/ML IJ SOLN
INTRAMUSCULAR | Status: DC | PRN
  Administered 2019-06-07: 4 mg via INTRAVENOUS

## 2019-06-07 MED ORDER — DEXAMETHASONE SODIUM PHOSPHATE 10 MG/ML IJ SOLN
INTRAMUSCULAR | Status: AC
  Filled 2019-06-07: qty 1

## 2019-06-07 MED ORDER — PHENYLEPHRINE 40 MCG/ML (10ML) SYRINGE FOR IV PUSH (FOR BLOOD PRESSURE SUPPORT)
PREFILLED_SYRINGE | INTRAVENOUS | Status: AC
  Filled 2019-06-07: qty 10

## 2019-06-07 MED ORDER — SUCCINYLCHOLINE CHLORIDE 200 MG/10ML IV SOSY
PREFILLED_SYRINGE | INTRAVENOUS | Status: AC
  Filled 2019-06-07: qty 10

## 2019-06-07 MED ORDER — PROPOFOL 10 MG/ML IV BOLUS
INTRAVENOUS | Status: DC | PRN
  Administered 2019-06-07: 50 mg via INTRAVENOUS

## 2019-06-07 MED ORDER — LACTATED RINGERS IV SOLN: INTRAVENOUS | Status: DC | PRN

## 2019-06-07 MED ORDER — CIPROFLOXACIN IN D5W 400 MG/200ML IV SOLN
400.0000 mg | Freq: Two times a day (BID) | INTRAVENOUS | Status: DC
  Administered 2019-06-07: 400 mg via INTRAVENOUS
  Filled 2019-06-07 (×3): qty 200

## 2019-06-07 MED ORDER — CHLORHEXIDINE GLUCONATE CLOTH 2 % EX PADS: 6.0000 | MEDICATED_PAD | Freq: Once | CUTANEOUS | Status: DC

## 2019-06-07 MED ORDER — HYDRALAZINE HCL 20 MG/ML IJ SOLN
10.0000 mg | INTRAMUSCULAR | Status: DC | PRN
  Administered 2019-06-07: 10 mg via INTRAVENOUS
  Filled 2019-06-07: qty 1

## 2019-06-07 MED ORDER — CIPROFLOXACIN IN D5W 400 MG/200ML IV SOLN
400.0000 mg | Freq: Two times a day (BID) | INTRAVENOUS | Status: AC
  Administered 2019-06-07 – 2019-06-08 (×2): 400 mg via INTRAVENOUS
  Filled 2019-06-07 (×2): qty 200

## 2019-06-07 MED ORDER — ACETAMINOPHEN 650 MG RE SUPP: 650.0000 mg | Freq: Four times a day (QID) | RECTAL | Status: DC | PRN

## 2019-06-07 MED ORDER — ONDANSETRON HCL 4 MG/2ML IJ SOLN
INTRAMUSCULAR | Status: AC
  Filled 2019-06-07: qty 4

## 2019-06-07 MED ORDER — METRONIDAZOLE IN NACL 5-0.79 MG/ML-% IV SOLN
500.0000 mg | Freq: Three times a day (TID) | INTRAVENOUS | Status: DC
  Administered 2019-06-07: 500 mg via INTRAVENOUS
  Filled 2019-06-07: qty 100

## 2019-06-07 MED ORDER — ONDANSETRON HCL 4 MG/2ML IJ SOLN
4.0000 mg | Freq: Four times a day (QID) | INTRAMUSCULAR | Status: DC | PRN
  Administered 2019-06-07: 4 mg via INTRAVENOUS
  Filled 2019-06-07: qty 2

## 2019-06-07 MED ORDER — DIPHENHYDRAMINE HCL 50 MG/ML IJ SOLN
INTRAMUSCULAR | Status: DC | PRN
  Administered 2019-06-07: 12.5 mg via INTRAVENOUS

## 2019-06-07 MED ORDER — SUCCINYLCHOLINE CHLORIDE 200 MG/10ML IV SOSY
PREFILLED_SYRINGE | INTRAVENOUS | Status: DC | PRN
  Administered 2019-06-07: 120 mg via INTRAVENOUS

## 2019-06-07 MED ORDER — METOPROLOL TARTRATE 5 MG/5ML IV SOLN
2.5000 mg | Freq: Three times a day (TID) | INTRAVENOUS | Status: DC
  Administered 2019-06-07 – 2019-06-09 (×8): 2.5 mg via INTRAVENOUS
  Filled 2019-06-07 (×8): qty 5

## 2019-06-07 SURGICAL SUPPLY — 49 items
APL PRP STRL LF DISP 70% ISPRP (MISCELLANEOUS) ×2
CHLORAPREP W/TINT 26 (MISCELLANEOUS) ×4 IMPLANT
CLIP VESOCCLUDE LG 6/CT (CLIP) ×4 IMPLANT
CLIP VESOCCLUDE MED 6/CT (CLIP) ×4 IMPLANT
CLIP VESOCCLUDE SM WIDE 6/CT (CLIP) ×1 IMPLANT
COVER SURGICAL LIGHT HANDLE (MISCELLANEOUS) ×4 IMPLANT
COVER WAND RF STERILE (DRAPES) ×1 IMPLANT
DRAPE LAPAROSCOPIC ABDOMINAL (DRAPES) ×4 IMPLANT
DRAPE WARM FLUID 44X44 (DRAPES) ×4 IMPLANT
DRSG COVADERM 4X10 (GAUZE/BANDAGES/DRESSINGS) ×3 IMPLANT
DRSG OPSITE POSTOP 4X10 (GAUZE/BANDAGES/DRESSINGS) IMPLANT
DRSG OPSITE POSTOP 4X8 (GAUZE/BANDAGES/DRESSINGS) IMPLANT
ELECT BLADE 6.5 EXT (BLADE) IMPLANT
ELECT CAUTERY BLADE 6.4 (BLADE) ×4 IMPLANT
ELECT REM PT RETURN 9FT ADLT (ELECTROSURGICAL) ×4
ELECTRODE REM PT RTRN 9FT ADLT (ELECTROSURGICAL) ×2 IMPLANT
GLOVE BIOGEL PI IND STRL 7.0 (GLOVE) ×2 IMPLANT
GLOVE BIOGEL PI INDICATOR 7.0 (GLOVE) ×2
GLOVE SURG SS PI 7.0 STRL IVOR (GLOVE) ×4 IMPLANT
GOWN STRL REUS W/ TWL LRG LVL3 (GOWN DISPOSABLE) ×6 IMPLANT
GOWN STRL REUS W/TWL LRG LVL3 (GOWN DISPOSABLE) ×16
HANDLE SUCTION POOLE (INSTRUMENTS) ×2 IMPLANT
KIT BASIN OR (CUSTOM PROCEDURE TRAY) ×4 IMPLANT
LIGASURE IMPACT 36 18CM CVD LR (INSTRUMENTS) IMPLANT
LOOP VESSEL MAXI BLUE (MISCELLANEOUS) IMPLANT
LOOP VESSEL MINI RED (MISCELLANEOUS) IMPLANT
MESH HERNIA 3X6 (Mesh General) ×3 IMPLANT
NEEDLE 22X1 1/2 (OR ONLY) (NEEDLE) ×4 IMPLANT
PACK GENERAL/GYN (CUSTOM PROCEDURE TRAY) ×4 IMPLANT
PENCIL SMOKE EVACUATOR (MISCELLANEOUS) ×4 IMPLANT
SPONGE LAP 18X18 RF (DISPOSABLE) IMPLANT
STAPLER VISISTAT 35W (STAPLE) ×4 IMPLANT
SUCTION POOLE HANDLE (INSTRUMENTS) ×4
SUT MNCRL AB 4-0 PS2 18 (SUTURE) ×3 IMPLANT
SUT NOVA NAB GS-21 0 18 T12 DT (SUTURE) ×3 IMPLANT
SUT PDS AB 0 CT 36 (SUTURE) IMPLANT
SUT PDS AB 1 TP1 96 (SUTURE) IMPLANT
SUT SILK 2 0 (SUTURE) ×4
SUT SILK 2 0 SH CR/8 (SUTURE) ×4 IMPLANT
SUT SILK 2-0 18XBRD TIE 12 (SUTURE) ×2 IMPLANT
SUT SILK 3 0 (SUTURE) ×4
SUT SILK 3 0 SH CR/8 (SUTURE) ×4 IMPLANT
SUT SILK 3-0 18XBRD TIE 12 (SUTURE) ×2 IMPLANT
SUT VIC AB 3-0 SH 27 (SUTURE) ×4
SUT VIC AB 3-0 SH 27XBRD (SUTURE) ×1 IMPLANT
SYR CONTROL 10ML LL (SYRINGE) ×3 IMPLANT
TOWEL GREEN STERILE (TOWEL DISPOSABLE) ×4 IMPLANT
TRAY FOLEY MTR SLVR 16FR STAT (SET/KITS/TRAYS/PACK) ×4 IMPLANT
YANKAUER SUCT BULB TIP NO VENT (SUCTIONS) ×3 IMPLANT

## 2019-06-07 NOTE — Progress Notes (Signed)
[facility] Progress Note     Subjective: CC-  Abdominal pain about the same. Only tender at site of hernia with pressed. Some persistent nausea although it is a little less. No emesis. No flatus or BM. WBC 7.4, afebrile  Objective: Vital signs in last 24 hours: Temp:  [98.2 F (36.8 C)] 98.2 F (36.8 C) (03/18 0529) Pulse Rate:  [37-84] 68 (03/18 0731) Resp:  [12-24] 15 (03/18 0731) BP: (135-193)/(61-117) 164/74 (03/18 0731) SpO2:  [75 %-99 %] 95 % (03/18 0731) Weight:  [49.1 kg-52.2 kg] 49.1 kg (03/18 0527) Last BM Date: 06/05/19  Intake/Output from previous day: No intake/output data recorded. Intake/Output this shift: Total I/O In: 147.4 [I.V.:47.4; IV Piggyback:100] Out: -   PE: Gen:  Alert, NAD, pleasant HEENT: EOM's intact, pupils equal and round Card:  Bradycardic, palpable DP pulses bilaterally Pulm:  CTAB, no W/R/R, rate and effort normal Abd: Soft, ND, +BS, infraumbilical hernia firm and mildly tender to touch/ unable to reduce/ no overlying skin changes, otherwise abdomen is nontender Ext:  no BUE/BLE edema, calves soft and nontender  Lab Results:  Recent Labs    06/06/19 2102 06/07/19 0625  WBC 7.6 7.4  HGB 14.0 13.2  HCT 44.1 40.7  PLT 205 219   BMET Recent Labs    06/06/19 2102  NA 138  K 3.6  CL 98  CO2 29  GLUCOSE 137*  BUN 26*  CREATININE 0.77  CALCIUM 9.4   PT/INR Recent Labs    06/07/19 0625  LABPROT 14.9  INR 1.2   CMP     Component Value Date/Time   NA 138 06/06/2019 2102   NA 138 04/30/2019 1123   K 3.6 06/06/2019 2102   CL 98 06/06/2019 2102   CO2 29 06/06/2019 2102   GLUCOSE 137 (H) 06/06/2019 2102   BUN 26 (H) 06/06/2019 2102   BUN 23 04/30/2019 1123   CREATININE 0.77 06/06/2019 2102   CREATININE 1.28 (H) 08/02/2016 1420   CALCIUM 9.4 06/06/2019 2102   PROT 8.2 (H) 06/06/2019 2102   PROT 7.1 03/20/2018 1059   ALBUMIN 4.4 06/06/2019 2102   ALBUMIN 4.3 03/20/2018 1059   AST 22 06/06/2019 2102   ALT 12 06/06/2019 2102   ALKPHOS 115 06/06/2019 2102   BILITOT 0.4 06/06/2019 2102   BILITOT 0.5 03/20/2018 1059   GFRNONAA >60 06/06/2019 2102   GFRAA >60 06/06/2019 2102   Lipase  No results found for: LIPASE     Studies/Results: CT ABDOMEN PELVIS W CONTRAST  Result Date: 06/06/2019 CLINICAL DATA:  86 year old female with complicated hernia. EXAM: CT ABDOMEN AND PELVIS WITH CONTRAST TECHNIQUE: Multidetector CT imaging of the abdomen and pelvis was performed using the standard protocol following bolus administration of intravenous contrast. CONTRAST:  34mL OMNIPAQUE IOHEXOL 300 MG/ML  SOLN COMPARISON:  None. FINDINGS: Lower chest: The visualized lung bases are clear. There is moderate cardiomegaly. Partial calcification of the aortic annulus. No intra-abdominal free air or free fluid. Hepatobiliary: The liver is unremarkable. Mild intrahepatic biliary ductal dilatation. The gallbladder is unremarkable. Pancreas: Unremarkable. No pancreatic ductal dilatation or surrounding inflammatory changes. Spleen: Normal in size without focal abnormality. Adrenals/Urinary Tract: The adrenal glands are unremarkable. There is no hydronephrosis on either side. There is symmetric enhancement and excretion of contrast by both kidneys. The urinary bladder is grossly unremarkable. Stomach/Bowel: There is moderate stool throughout the colon. There are scattered colonic diverticula without active inflammatory changes. There is a ventral infraumbilical hernia containing a segment of small bowel.  The neck of the hernia defect measures approximately 12 mm. There is pinching of the bowel at the neck of the hernia with mild dilatation herniated segment and segment of the small bowel proximal to the hernia. The small bowel proximal to the hernia measures approximately 3 cm in diameter. There is mild associated inflammatory changes. There is a small hiatal hernia. The appendix is not visualized with certainty. No inflammatory  changes identified in the right lower quadrant. Vascular/Lymphatic: Moderate aortoiliac atherosclerotic disease. The IVC is unremarkable. No portal venous gas. There is no adenopathy. Reproductive: The uterus is grossly unremarkable. No adnexal masses. Other: None Musculoskeletal: Osteopenia with degenerative changes of the spine and multilevel disc desiccation and vacuum phenomena. There is a total right hip arthroplasty. Vertebroplasty changes of the sacrum. No acute osseous pathology. IMPRESSION: 1. Ventral infraumbilical hernia containing a segment of small bowel with associated mild or early obstruction. 2. Colonic diverticulosis. No active inflammatory changes. 3. Aortic Atherosclerosis (ICD10-I70.0). Electronically Signed   By: Anner Crete M.D.   On: 06/06/2019 22:46    Anti-infectives: Anti-infectives (From admission, onward)   Start     Dose/Rate Route Frequency Ordered Stop   06/07/19 0600  ciprofloxacin (CIPRO) IVPB 400 mg     400 mg 200 mL/hr over 60 Minutes Intravenous Every 12 hours 06/07/19 0517     06/07/19 0530  metroNIDAZOLE (FLAGYL) IVPB 500 mg     500 mg 100 mL/hr over 60 Minutes Intravenous Every 8 hours 06/07/19 0517         Assessment/Plan HTN Atrial flutter on eliquis (last dose 3/17 AM) Recent right THA 01/2019 Code status DNR  Incarcerated incisional hernia  - Patient is currently stable/not acutely ill with WBC WNL, afebrile. No signs of bowel compromise at this time. She will require surgical fixation for her hernia, but I think we can wait until tomorrow and allow her eliquis to wear off to decrease risks of bleeding with surgery. Ok for IV heparin today it needed. Will ask cardiology to see for surgical clearance. Continue NPO. If nausea worsens or patient vomits will need to place NG tube.  ID - cipro/flagyl 3/18>> FEN - IVF, NPO VTE - SCDs, ok for IV heparin if needed/ hold eliquis Foley - none Follow up - TBD   LOS: 0 days    Wellington Hampshire,  [facility] 06/07/2019, 8:53 AM Please see Amion for pager number during day hours 7:00am-4:30pm

## 2019-06-07 NOTE — Plan of Care (Signed)
Paged Dr. Marcello Moores at 12:20 AM to discuss pt's case. Awaiting call back

## 2019-06-07 NOTE — Discharge Instructions (Signed)
[facility] _______[facility]  HERNIA REPAIR: POST OP INSTRUCTIONS  Always review your discharge instruction sheet given to you by the facility where your surgery was performed. IF YOU HAVE DISABILITY OR FAMILY LEAVE FORMS, YOU MUST BRING THEM TO THE OFFICE FOR PROCESSING.   DO NOT GIVE THEM TO YOUR DOCTOR.  1. A  prescription for pain medication may be given to you upon discharge.  Take your pain medication as prescribed, if needed.  If narcotic pain medicine is not needed, then you may take acetaminophen (Tylenol) or ibuprofen (Advil) as needed. 2. Take your usually prescribed medications unless otherwise directed. If you need a refill on your pain medication, please contact your pharmacy.  They will contact our office to request authorization. Prescriptions will not be filled after 5 pm or on week-ends. 3. You should follow a light diet the first 24 hours after arrival home, such as soup and crackers, etc.  Be sure to include lots of fluids daily.  Resume your normal diet the day after surgery. 4.Most patients will experience some swelling and bruising around the umbilicus or in the groin and scrotum.  Ice packs and reclining will help.  Swelling and bruising can take several days to resolve.  6. It is common to experience some constipation if taking pain medication after surgery.  Increasing fluid intake and taking a stool softener (such as Colace) will usually help or prevent this problem from occurring.  A mild laxative (Milk of Magnesia or Miralax) should be taken according to package directions if there are no bowel movements after 48 hours. 7. Unless discharge instructions indicate otherwise, you may remove your bandages 24-48 hours after surgery, and you may shower at that time.  You may have steri-strips (small skin tapes) in place directly over the incision.  These strips should be left on the skin for 7-10 days.  If your surgeon used skin glue on the incision, you may shower in  24 hours.  The glue will flake off over the next 2-3 weeks.  Any sutures or staples will be removed at the office during your follow-up visit. 8. ACTIVITIES:  You may resume regular (light) daily activities beginning the next day--such as daily self-care, walking, climbing stairs--gradually increasing activities as tolerated.  You may have sexual intercourse when it is comfortable.  Refrain from any heavy lifting or straining until approved by your doctor.  a.You may drive when you are no longer taking prescription pain medication, you can comfortably wear a seatbelt, and you can safely maneuver your car and apply brakes.  9.You should see your doctor in the office for a follow-up appointment approximately 2-3 weeks after your surgery.  Make sure that you call for this appointment within a day or two after you arrive home to insure a convenient appointment time. 10.OTHER INSTRUCTIONS: _________________________    _____________________________________  WHEN TO CALL YOUR DOCTOR: 1. Fever over 101.0 2. Inability to urinate 3. Nausea and/or vomiting 4. Extreme swelling or bruising 5. Continued bleeding from incision. 6. Increased pain, redness, or drainage from the incision  The clinic staff is available to answer your questions during regular business hours.  Please don't hesitate to call and ask to speak to one of the nurses for clinical concerns.  If you have a medical emergency, go to the nearest emergency room or call 911.  A surgeon from [facility] is always on call at the hospital   [address], [place], [place], [place]  [address] ?  [address] [address], [place], [place]   [address] [phone] ? [phone] ? FAX [phone] Web site: [email or web address]  

## 2019-06-07 NOTE — Assessment & Plan Note (Signed)
Rate controlled at present time with lopressor.

## 2019-06-07 NOTE — Anesthesia Procedure Notes (Signed)
Procedure Name: Intubation Date/Time: 06/07/2019 1:56 PM Performed by: Colin Benton, CRNA Pre-anesthesia Checklist: Patient identified, Emergency Drugs available and Suction available Patient Re-evaluated:Patient Re-evaluated prior to induction Oxygen Delivery Method: Circle system utilized Preoxygenation: Pre-oxygenation with 100% oxygen Induction Type: IV induction and Rapid sequence Laryngoscope Size: Glidescope and 3 Grade View: Grade I Tube type: Oral Tube size: 7.0 mm Number of attempts: 1 Airway Equipment and Method: Video-laryngoscopy and Rigid stylet Placement Confirmation: ETT inserted through vocal cords under direct vision and positive ETCO2 Secured at: 22 cm Tube secured with: Tape Dental Injury: Teeth and Oropharynx as per pre-operative assessment

## 2019-06-07 NOTE — Transfer of Care (Signed)
Immediate Anesthesia Transfer of Care Note  Patient: Glenda [name]  Procedure(s) Performed: Reduction and Repair of Incisional Hernia with Overlay Mesh (N/A Abdomen)  Patient Location: PACU  Anesthesia Type:General  Level of Consciousness: drowsy  Airway & Oxygen Therapy: Patient Spontanous Breathing and Patient connected to nasal cannula oxygen  Post-op Assessment: Report given to RN and Post -op Vital signs reviewed and stable  Post vital signs: Reviewed and stable  Last Vitals:  Vitals Value Taken Time  BP 193/73 06/07/19 1523  Temp 37.1 C 06/07/19 1523  Pulse 50 06/07/19 1524  Resp 20 06/07/19 1524  SpO2 98 % 06/07/19 1524  Vitals shown include unvalidated device data.  Last Pain:  Vitals:   06/07/19 1306  TempSrc:   PainSc: 0-No pain      Patients Stated Pain Goal: 3 ([id number])  Complications: No apparent anesthesia complications

## 2019-06-07 NOTE — ED Notes (Signed)
[facility] contacted and paperwork printed  

## 2019-06-07 NOTE — Assessment & Plan Note (Signed)
Stable

## 2019-06-07 NOTE — Assessment & Plan Note (Signed)
Verified DNR status with patient. ?

## 2019-06-07 NOTE — Progress Notes (Signed)
PROGRESS NOTE  Glenda [name] [id number] DOB: 1924/10/27   PCP: Marin Olp, MD  Patient is from: SNF  DOA: 06/06/2019 LOS: 0  Brief Narrative / Interim history: 86 year old female with history of A. flutter on Eliquis, diastolic CHF, DDD, osteoarthritis, total right hip replacement, HTN, multiple abdominal surgeries and ventral hernia presenting with abdominal pain and nausea for about 2 days, and admitted for incarcerated ventral hernia as noted on CT abdomen and pelvis.  General surgery consulted.  Plan is for surgical reduction.   Subjective: Seen and examined this morning.  No major events overnight.  Reports improvement in abdominal pain but continues to feel nauseous.  She denies emesis.  Last bowel movement 2 days ago.  She says she is passing some gases.  Denies chest pain or dyspnea  Objective: Vitals:   06/07/19 0529 06/07/19 0731 06/07/19 0854 06/07/19 1027  BP: (!) 176/75 (!) 164/74 (!) 160/66 126/67  Pulse: (!) 59 68    Resp: (!) 24 15    Temp: 98.2 F (36.8 C)  98.3 F (36.8 C)   TempSrc: Oral  Oral   SpO2: 96% 95%    Weight:      Height:        Intake/Output Summary (Last 24 hours) at 06/07/2019 1154 Last data filed at 06/07/2019 0730 Gross per 24 hour  Intake 147.42 ml  Output -  Net 147.42 ml   Filed Weights   06/06/19 2115 06/07/19 0527  Weight: 52.2 kg 49.1 kg    Examination:  GENERAL: No acute distress.  Appears well.  HEENT: MMM.  Vision and hearing grossly intact.  NECK: Supple.  No apparent JVD.  RESP:  No IWOB. Good air movement bilaterally. CVS:  RRR. Heart sounds normal.  ABD/GI/GU: BS present.  Abdominal scars.  Tender, nonreducible infraumbilical hernia to the right.  MSK/EXT:  Moves extremities. No apparent deformity. No edema.  SKIN: no apparent skin lesion or wound NEURO: Awake, alert and oriented fairly.  No apparent focal neuro deficit. PSYCH: Calm. Normal affect.  Procedures:  None  Assessment & Plan:  Incarcerated infraumbilical hernia-tender and not reducible. -Plan for surgical reduction by general surgery today. -N.p.o. -Eliquis on hold.  Last dose the morning of 3/17. -Analgesics and antiemetics -Continue Flagyl and Cipro for now -Surgical risk-mainly age and the surgery itself.  She is stable from cardiopulmonary standpoint.  A flutter: Currently rate controlled on IV metoprolol. -Continue IV metoprolol -Hold Eliquis.  No bridging required.  Chronic diastolic CHF: Stable.  Appears euvolemic.  No cardiopulmonary symptoms.  On Lasix 30 mg daily at home.  -Hold IV Lasix -Continue gentle IV fluid -Monitor fluid status  Chronic pain/DJD/osteoarthritis-on Norco at home. -As needed morphine while n.p.o.  Essential HTN: Was hypotensive this morning.  Normotensive now -IV metoprolol and as needed hydralazine  History of breast cancer: Followed by Dr. Lindi Adie. -Resume anastrozole when able  Anxiety/depression -Resume home Zoloft and Restoril when able  Debility: Patient is from SNF. -PT/OT after surgery               DVT prophylaxis: SCD Code Status: DNR/DNI Family Communication: Patient and/or RN. Available if any question.   Discharge barrier: Planned surgery for incarcerated abdominal hernia Patient is from: SNF Final disposition: Likely SNF once stable after surgery.  Consultants: General surgery   Microbiology summarized: -Influenza PCR negative -COVID-19 negative  Sch Meds:  Scheduled Meds: . Chlorhexidine Gluconate Cloth  6 each Topical Once  . metoprolol tartrate  2.5 mg  Intravenous Q8H   Continuous Infusions: . ciprofloxacin 400 mg (06/07/19 0741)  . lactated ringers Stopped (06/07/19 0729)  . metronidazole Stopped (06/07/19 [id number])   PRN Meds:.acetaminophen **OR** acetaminophen, hydrALAZINE, morphine injection, ondansetron **OR** ondansetron (ZOFRAN) IV, promethazine, white petrolatum  Antimicrobials: Anti-infectives (From admission, onward)    Start     Dose/Rate Route Frequency Ordered Stop   06/07/19 0600  ciprofloxacin (CIPRO) IVPB 400 mg     400 mg 200 mL/hr over 60 Minutes Intravenous Every 12 hours 06/07/19 0517     06/07/19 0530  metroNIDAZOLE (FLAGYL) IVPB 500 mg     500 mg 100 mL/hr over 60 Minutes Intravenous Every 8 hours 06/07/19 0517         I have personally reviewed the following labs and images: CBC: Recent Labs  Lab 06/06/19 2102 06/07/19 0625  WBC 7.6 7.4  NEUTROABS 5.8 5.9  HGB 14.0 13.2  HCT 44.1 40.7  MCV 89.1 87.9  PLT 205 219   BMP &GFR Recent Labs  Lab 06/06/19 2102  NA 138  K 3.6  CL 98  CO2 29  GLUCOSE 137*  BUN 26*  CREATININE 0.77  CALCIUM 9.4   Estimated Creatinine Clearance: 33.9 mL/min (by C-G formula based on SCr of 0.77 mg/dL). Liver & Pancreas: Recent Labs  Lab 06/06/19 2102  AST 22  ALT 12  ALKPHOS 115  BILITOT 0.4  PROT 8.2*  ALBUMIN 4.4   No results for input(s): LIPASE, AMYLASE in the last 168 hours. No results for input(s): AMMONIA in the last 168 hours. Diabetic: No results for input(s): HGBA1C in the last 72 hours. No results for input(s): GLUCAP in the last 168 hours. Cardiac Enzymes: No results for input(s): CKTOTAL, CKMB, CKMBINDEX, TROPONINI in the last 168 hours. No results for input(s): PROBNP in the last 8760 hours. Coagulation Profile: Recent Labs  Lab 06/07/19 0625  INR 1.2   Thyroid Function Tests: No results for input(s): TSH, T4TOTAL, FREET4, T3FREE, THYROIDAB in the last 72 hours. Lipid Profile: No results for input(s): CHOL, HDL, LDLCALC, TRIG, CHOLHDL, LDLDIRECT in the last 72 hours. Anemia Panel: No results for input(s): VITAMINB12, FOLATE, FERRITIN, TIBC, IRON, RETICCTPCT in the last 72 hours. Urine analysis:    Component Value Date/Time   COLORURINE YELLOW 01/30/2019 [place] 01/30/2019 1443   LABSPEC 1.012 01/30/2019 1443   PHURINE 6.0 01/30/2019 [place] 01/30/2019 [place] 01/16/2018 1300   HGBUR NEGATIVE 01/30/2019 [place] 01/30/2019 1443   BILIRUBINUR Negative 12/19/2017 [place] 01/30/2019 1443   PROTEINUR NEGATIVE 01/30/2019 1443   UROBILINOGEN 0.2 01/16/2018 1300   NITRITE NEGATIVE 01/30/2019 1443   LEUKOCYTESUR NEGATIVE 01/30/2019 1443   Sepsis Labs: Invalid input(s): PROCALCITONIN, [place]  Microbiology: Recent Results (from the past 240 hour(s))  Respiratory Panel by RT PCR (Flu A&B, Covid) - Nasopharyngeal Swab     Status: None   Collection Time: 06/06/19  9:40 PM   Specimen: Nasopharyngeal Swab  Result Value Ref Range Status   SARS Coronavirus 2 by RT PCR NEGATIVE NEGATIVE Final    Comment: (NOTE) SARS-CoV-2 target nucleic acids are NOT DETECTED. The SARS-CoV-2 RNA is generally detectable in upper respiratoy specimens during the acute phase of infection. The lowest concentration of SARS-CoV-2 viral copies this assay can detect is 131 copies/mL. A negative result does not preclude SARS-Cov-2 infection and should not be used as the sole basis for treatment or other patient management  decisions. A negative result may occur with  improper specimen collection/handling, submission of specimen other than nasopharyngeal swab, presence of viral mutation(s) within the areas targeted by this assay, and inadequate number of viral copies (<131 copies/mL). A negative result must be combined with clinical observations, patient history, and epidemiological information. The expected result is Negative. Fact Sheet for Patients:  PinkCheek.be Fact Sheet for Healthcare Providers:  GravelBags.it This test is not yet ap proved or cleared by the [place] FDA and  has been authorized for detection and/or diagnosis of SARS-CoV-2 by FDA under an Emergency Use Authorization (EUA). This EUA will remain  in effect (meaning this test can be used) for the  duration of the COVID-19 declaration under Section 564(b)(1) of the Act, 21 U.S.C. section 360bbb-3(b)(1), unless the authorization is terminated or revoked sooner.    Influenza A by PCR NEGATIVE NEGATIVE Final   Influenza B by PCR NEGATIVE NEGATIVE Final    Comment: (NOTE) The Xpert Xpress SARS-CoV-2/FLU/RSV assay is intended as an aid in  the diagnosis of influenza from Nasopharyngeal swab specimens and  should not be used as a sole basis for treatment. Nasal washings and  aspirates are unacceptable for Xpert Xpress SARS-CoV-2/FLU/RSV  testing. Fact Sheet for Patients: PinkCheek.be Fact Sheet for Healthcare Providers: GravelBags.it This test is not yet approved or cleared by the [place] FDA and  has been authorized for detection and/or diagnosis of SARS-CoV-2 by  FDA under an Emergency Use Authorization (EUA). This EUA will remain  in effect (meaning this test can be used) for the duration of the  Covid-19 declaration under Section 564(b)(1) of the Act, 21  U.S.C. section 360bbb-3(b)(1), unless the authorization is  terminated or revoked. Performed at [facility], [place] [address] [place], [place] [address]     Radiology Studies: CT ABDOMEN PELVIS W CONTRAST  Result Date: 06/06/2019 CLINICAL DATA:  86 year old female with complicated hernia. EXAM: CT ABDOMEN AND PELVIS WITH CONTRAST TECHNIQUE: Multidetector CT imaging of the abdomen and pelvis was performed using the standard protocol following bolus administration of intravenous contrast. CONTRAST:  63mL OMNIPAQUE IOHEXOL 300 MG/ML  SOLN COMPARISON:  None. FINDINGS: Lower chest: The visualized lung bases are clear. There is moderate cardiomegaly. Partial calcification of the aortic annulus. No intra-abdominal free air or free fluid. Hepatobiliary: The liver is unremarkable. Mild intrahepatic biliary ductal dilatation. The gallbladder is unremarkable.  Pancreas: Unremarkable. No pancreatic ductal dilatation or surrounding inflammatory changes. Spleen: Normal in size without focal abnormality. Adrenals/Urinary Tract: The adrenal glands are unremarkable. There is no hydronephrosis on either side. There is symmetric enhancement and excretion of contrast by both kidneys. The urinary bladder is grossly unremarkable. Stomach/Bowel: There is moderate stool throughout the colon. There are scattered colonic diverticula without active inflammatory changes. There is a ventral infraumbilical hernia containing a segment of small bowel. The neck of the hernia defect measures approximately 12 mm. There is pinching of the bowel at the neck of the hernia with mild dilatation herniated segment and segment of the small bowel proximal to the hernia. The small bowel proximal to the hernia measures approximately 3 cm in diameter. There is mild associated inflammatory changes. There is a small hiatal hernia. The appendix is not visualized with certainty. No inflammatory changes identified in the right lower quadrant. Vascular/Lymphatic: Moderate aortoiliac atherosclerotic disease. The IVC is unremarkable. No portal venous gas. There is no adenopathy. Reproductive: The uterus is grossly unremarkable. No adnexal masses. Other: None Musculoskeletal: Osteopenia with degenerative changes of  the spine and multilevel disc desiccation and vacuum phenomena. There is a total right hip arthroplasty. Vertebroplasty changes of the sacrum. No acute osseous pathology. IMPRESSION: 1. Ventral infraumbilical hernia containing a segment of small bowel with associated mild or early obstruction. 2. Colonic diverticulosis. No active inflammatory changes. 3. Aortic Atherosclerosis (ICD10-I70.0). Electronically Signed   By: Anner Crete M.D.   On: 06/06/2019 22:46   DG Abd Portable 1V  Result Date: 06/07/2019 CLINICAL DATA:  Reported incarcerated ventral hernia EXAM: PORTABLE ABDOMEN - 1 VIEW  COMPARISON:  CT abdomen and pelvis June 06, 2019 FINDINGS: There is no appreciable bowel dilatation or air-fluid level. No free air. Contrast is noted in the urinary bladder. There is a total hip replacement on the right. Visualized lung bases are clear. There is aortic atherosclerosis. Evidence of previous sacroplasty on the right superiorly. IMPRESSION: No apparent bowel obstruction by radiography. No appreciable bowel dilatation or air-fluid levels. No free air. Electronically Signed   By: Lowella Grip III M.D.   On: 06/07/2019 08:53      Taye T. [place]  If 7PM-7AM, please contact night-coverage [email or web address] Password [facility] 06/07/2019, 11:54 AM

## 2019-06-07 NOTE — ED Notes (Signed)
Report given to [facility]. 
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
## 2019-06-07 NOTE — Anesthesia Postprocedure Evaluation (Signed)
Anesthesia Post Note  Patient: Glenda [name]  Procedure(s) Performed: Reduction and Repair of Incisional Hernia with Overlay Mesh (N/A Abdomen)     Patient location during evaluation: PACU Anesthesia Type: General Level of consciousness: awake and alert Pain management: pain level controlled Vital Signs Assessment: post-procedure vital signs reviewed and stable Respiratory status: spontaneous breathing, nonlabored ventilation, respiratory function stable and patient connected to nasal cannula oxygen Cardiovascular status: blood pressure returned to baseline and stable Postop Assessment: no apparent nausea or vomiting Anesthetic complications: no    Last Vitals:  Vitals:   06/07/19 1536 06/07/19 1548  BP: (!) 174/70 (!) 167/79  Pulse: 61 64  Resp: 18 15  Temp:    SpO2: 95% 93%    Last Pain:  Vitals:   06/07/19 1548  TempSrc:   PainSc: 0-No pain                 Yuriy Cui L Braiden Rodman

## 2019-06-07 NOTE — Op Note (Signed)
Preoperative diagnosis: obstructing incisional hernia  Postoperative diagnosis: same   Procedure: open reduction and repair of obstructing incisional hernia with overlay mesh (mesh placed above the anterior fascia), lysis of adhesions  Surgeon: Gurney Maxin, M.D.  Asst: Alferd Apa  Anesthesia: general  Indications for procedure: Glenda [name] is a 86 y.o. year old female with symptoms of abdominal pain and nausea. Her work up showed an incisional hernia with imaging showing small intestine within the sac with obstructive features. Options were discussed and she, the family, and team decided to proceed with surgery for reduction and repair.  Description of procedure: The patient was brought into the operative suite. Anesthesia was administered with General endotracheal anesthesia. WHO checklist was applied. The patient was then placed in supine position. The area was prepped and draped in the usual sterile fashion.  Next, a vertical incision was made over the area of the hernia. Cautery and blunt dissection were used to dissect through the subcutaneous tissue and the hernia was entered bluntly. There was omentum that appeared ischemic and a loop of small intestine that was red and distended. The hernia defect was increased in size and the omentum and small intestine were reduced. Cautery and blunt dissection was used to dissect the adhesions of omentum and small intestine to the abdominal wall.  Next, the small intestine was reinspected. The intestine had pinked up and showed peristalsis. No ischemic regions were seen. The omentum also appeared viable. The contents were reduced. Hemostasis was checked and intact. The fascia was closed with interrupted 0 PDS. A 8 x 12 cm Bard mesh was placed on top of the anterior fascia and sutured to the anterior fascia with interrupted 0 Novafil. 3-0 vicryl was used to appose the deep dermal space. The skin was closed with 4-0 monocryl in subcuticular  running fashion. Dressing was put in place. The patient awoke from anesthesia and was brought to pacu in stable condition   Findings: obstructing incisional hernia with small intestine  Specimen: none  Implant: Bard mesh   Blood loss: 20 ml  Local anesthesia: none  Complications: none  Gurney Maxin, M.D. General, Bariatric, & Minimally Invasive Surgery [facility]

## 2019-06-07 NOTE — Anesthesia Preprocedure Evaluation (Addendum)
Anesthesia Evaluation  Patient identified by MRN, date of birth, ID band Patient awake    Reviewed: Allergy & Precautions, NPO status , Patient's Chart, lab work & pertinent test results  Airway Mallampati: III  TM Distance: >3 FB Neck ROM: Full  Mouth opening: Limited Mouth Opening  Dental no notable dental hx. (+) Teeth Intact, Dental Advisory Given   Pulmonary neg pulmonary ROS,    Pulmonary exam normal breath sounds clear to auscultation       Cardiovascular hypertension, +CHF  Normal cardiovascular exam+ dysrhythmias (on eliquis) Atrial Fibrillation + Valvular Problems/Murmurs AS  Rhythm:Irregular Rate:Normal  TTE 04/2019 Normal LVEF, valves ok   Neuro/Psych Anxiety negative neurological ROS  negative psych ROS   GI/Hepatic Neg liver ROS, GERD  Medicated,  Endo/Other  negative endocrine ROS  Renal/GU negative Renal ROS  negative genitourinary   Musculoskeletal  (+) Arthritis ,   Abdominal   Peds  Hematology negative hematology ROS (+)   Anesthesia Other Findings   Reproductive/Obstetrics                          Anesthesia Physical Anesthesia Plan  ASA: III  Anesthesia Plan: General   Post-op Pain Management:    Induction: Intravenous  PONV Risk Score and Plan: 3 and Dexamethasone, Ondansetron and Treatment may vary due to age or medical condition  Airway Management Planned: Oral ETT and Video Laryngoscope Planned  Additional Equipment:   Intra-op Plan:   Post-operative Plan: Extubation in OR  Informed Consent: I have reviewed the patients History and Physical, chart, labs and discussed the procedure including the risks, benefits and alternatives for the proposed anesthesia with the patient or authorized representative who has indicated his/her understanding and acceptance.   Patient has DNR.  Discussed DNR with patient and Suspend DNR.   Dental advisory given  Plan  Discussed with: CRNA  Anesthesia Plan Comments:        Anesthesia Quick Evaluation

## 2019-06-07 NOTE — ED Notes (Signed)
[facility] at bedside 

## 2019-06-07 NOTE — Consult Note (Signed)
Consultation History and Physical    Glenda [name] [id number] DOB: Aug 01, 1924 DOA: 06/06/2019  PCP: Marin Olp, MD   Patient coming from: SNF  I have personally briefly reviewed patient's old medical records in [place]  Requesting MD: Maryan Rued Reason for Consult: incarcerated ventral hernia  HPI: 86 year old female with a history of atrial flutter, chronic anticoagulation with Eliquis last dose was the morning of 06/06/2019, hypertension, recent total hip replacement after hip fracture who is currently convalescing at a local nursing home ([facility]) presents to the ER this evening with a sudden onset of nausea, abdominal pain.  Patient has a known history of ventral hernia.   Ventral hernia was not reducible by the ER provider.  CT scan demonstrates ventral hernia with associated mild early up obstruction.  [facility] hospitalist consulted for medical management.  Patient without any chest pain or shortness of breath.  Patient is concerned about having surgery.  She has done well with her total hip arthroplasty less than 4 months ago.    ED Course: CT abd/pelvis shows ventral hernia with early obstruction.  General surgery consulted.  Review of Systems:  Review of Systems  Constitutional: Negative.   HENT: Negative.   Eyes: Negative.   Respiratory: Negative.   Cardiovascular: Negative.   Gastrointestinal: Positive for abdominal pain and nausea. Negative for diarrhea.       Last BM was yesterday  Genitourinary: Negative.   Musculoskeletal: Negative.   Skin: Negative.   Neurological: Negative.   Endo/Heme/Allergies: Negative.   Psychiatric/Behavioral: Negative.   All other systems reviewed and are negative.   Past Medical History:  Diagnosis Date  . Aortic stenosis    Echo 02/2011 showing mild AS with normal LV systolic function  . Arthritis    "lower back" (10/09/2015)  . Atrial flutter with rapid ventricular response ([place]) 08/19/2016   s/p successful DCCV on 08/20/16, continue eliquis  . Chronic bronchitis ([place])    "off and on; several years" (10/09/2015)  . Chronic diastolic CHF (congestive heart failure) ([place])    a. 12/2015 Echo: EF 55-60%, mild AI/MR, mildly dil LA.  Marland Kitchen Chronic lower back pain   . Complication of anesthesia    "they had trouble waking me up after colon resection"  . Confusion    Originally listed as TIA-pt denies this hx on 10/09/2015 "it was the [place] I was taking; they had thought I was having a st originally roke"  . DDD (degenerative disc disease), lumbar    severe facet dz and adv DDD MRI L spine 2009  . Diverticulosis   . Dysrhythmia    A-Fib  . Femur fracture, right ([place]) 10/18/2018  . GERD (gastroesophageal reflux disease)   . Hiatal hernia    hx  . Hypercholesterolemia   . Hypertension   . Insomnia   . OA (osteoarthritis) of knee   . Paroxysmal atrial flutter ([place])    a. 09/2015 s/p TEE/DCCV;  b. CHA2DS2VASc = 5-->Eliquis.  . Peripheral edema   . PVCs (premature ventricular contractions)   . Sacral fracture ([place]) 11/06/2012  . Vertigo 11/19/2012    Past Surgical History:  Procedure Laterality Date  . BREAST BIOPSY Left   . CARDIAC CATHETERIZATION  02/17/2001   MILD REGURGITATION. EF 60%  . CARDIOVERSION N/A 10/08/2015   Procedure: CARDIOVERSION;  Surgeon: Sanda Klein, MD;  Location: [facility];  Service: Cardiovascular;  Laterality: N/A;  . CARDIOVERSION N/A 08/20/2016   Procedure: Cardioversion;  Surgeon: Evans Lance, MD;  Location: [facility];  Service: Cardiovascular;  Laterality: N/A;  . CATARACT EXTRACTION W/ INTRAOCULAR LENS  IMPLANT, BILATERAL Bilateral   . CESAREAN SECTION  1954  . COLECTOMY     related to "blockage"  . COLONOSCOPY    . EXCISIONAL HEMORRHOIDECTOMY    . HIP PINNING,CANNULATED Right 10/19/2018   Procedure: Right percutaneous hip pinning;  Surgeon: Erle Crocker, MD;  Location: [facility];  Service: Orthopedics;  Laterality: Right;  . INGUINAL  HERNIA REPAIR Bilateral   . KYPHOPLASTY Right 09/11/2012   Procedure: Right Acrylic Sacroplasty;  Surgeon: Kristeen Miss, MD;  Location: [facility];  Service: Neurosurgery;  Laterality: Right;  Right  Acrylic Sacroplasty  . TEE WITHOUT CARDIOVERSION N/A 10/08/2015   Procedure: TRANSESOPHAGEAL ECHOCARDIOGRAM (TEE);  Surgeon: Sanda Klein, MD;  Location: [facility];  Service: Cardiovascular;  Laterality: N/A;  . TOTAL HIP ARTHROPLASTY Right 02/05/2019   Procedure: [place] TOTAL HIP ARTHROPLASTY ANTERIOR APPROACH AND REMOVAL OF SCREWS;  Surgeon: Frederik Pear, MD;  Location: [facility];  Service: Orthopedics;  Laterality: Right;  . TOTAL KNEE ARTHROPLASTY Right   . TOTAL MASTECTOMY Left 07/22/2017   Procedure: TOTAL MASTECTOMY;  Surgeon: Excell Seltzer, MD;  Location: [facility];  Service: General;  Laterality: Left;  . UMBILICAL HERNIA REPAIR       reports that she has never smoked. She has never used smokeless tobacco. She reports that she does not drink alcohol or use drugs.  Allergies  Allergen Reactions  . Ace Inhibitors Other (See Comments)    Cough  . Halcion [Triazolam]     UNSPECIFIED REACTION   . Pentazocine Lactate     UNSPECIFIED REACTION   . Sulfa Drugs Cross Reactors     UNSPECIFIED REACTION   . Trazodone And Nefazodone Other (See Comments)    Per MAR  . Amitriptyline Other (See Comments)    Hyperactivity  . Avelox [Moxifloxacin Hcl In Nacl] Other (See Comments)    dizziness  . Penicillins Rash    Family History  Problem Relation Age of Onset  . Heart disease Mother   . Hypertension Father   . Other Sister        23 in 2019  . Other Sister        53 in 2019  . Heart disease Sister   . Heart attack Neg Hx   . Stroke Neg Hx     Prior to Admission medications   Medication Sig Start Date End Date Taking? Authorizing Provider  anastrozole (ARIMIDEX) 1 MG tablet Take 1 tablet (1 mg total) by mouth daily. 10/10/18  Yes Nicholas Lose, MD  apixaban (ELIQUIS) 2.5 MG TABS  tablet Take 1 tablet (2.5 mg total) by mouth 2 (two) times daily. 07/24/17  Yes Greer Pickerel, MD  benzonatate (TESSALON) 100 MG capsule Take 100 mg by mouth every 8 (eight) hours as needed for cough.   Yes [provider]  Cholecalciferol 1000 units tablet Take 1,000 Units by mouth daily.   Yes [provider]  Cranberry 425 MG CAPS Take 425 mg by mouth 2 (two) times daily.   Yes [provider]  docusate sodium (COLACE) 100 MG capsule Take 100 mg by mouth 2 (two) times daily.   Yes [provider]  furosemide (LASIX) 20 MG tablet Take 1.5 tablets (30 mg total) by mouth daily. 08/26/16  Yes Baldwin Jamaica, PA-C  HYDROcodone-acetaminophen (NORCO/VICODIN) 5-325 MG tablet Take 1 tablet by mouth 2 (two) times daily. At 9 Am and  3 PM. Separate by 6 hours from temazepam. Fill in 2 months 03/12/19  Yes Marin Olp, MD  Multiple Vitamins-Minerals (CEROVITE ADVANCED FORMULA PO) Take 1 tablet by mouth daily. (0800)   Yes [provider]  omeprazole (PRILOSEC) 40 MG capsule Take 40 mg by mouth daily. 08/30/17  Yes [provider]  ondansetron (ZOFRAN) 4 MG tablet Take 4 mg by mouth every 6 (six) hours as needed for nausea.   Yes [provider]  polyethylene glycol (MIRALAX / GLYCOLAX) packet Take 17 g by mouth See admin instructions. Mix 17 grams in 8 ounces of juice/water and drink daily, Monday through Saturday   Yes [provider]  sertraline (ZOLOFT) 25 MG tablet TAKE (1) TABLET BY MOUTH ONCE DAILY. Patient taking differently: Take 25 mg by mouth daily. TAKE (1) TABLET BY MOUTH ONCE DAILY. 09/15/18  Yes Marin Olp, MD  temazepam (RESTORIL) 15 MG capsule TAKE (1) CAPSULE BY MOUTH AT BEDTIME. SHOULD SEPARATE FROM HYDROCODONE BY AT LEAST 6 HOURS. Patient taking differently: Take 15 mg by mouth at bedtime. Should separate from hydrocodone by at least 6 hours 01/11/19  Yes Marin Olp, MD  tiZANidine (ZANAFLEX) 2 MG tablet  Take 1 tablet (2 mg total) by mouth every 6 (six) hours as needed. Patient taking differently: Take 2 mg by mouth every 6 (six) hours as needed for muscle spasms.  02/05/19  Yes Leighton Parody, PA-C    Physical Exam: Vitals:   06/06/19 2115 06/06/19 2307 06/06/19 2315  BP: (!) 191/117 (!) 186/90 (!) 192/96  Pulse: (!) 37 77 61  Resp: 13 12 14   Temp: 98.2 F (36.8 C)    TempSrc: Oral    SpO2: 96% (!) 75% 95%  Weight: 52.2 kg    Height: 5\' 1"  (1.549 m)      Physical Exam  Nursing note and vitals reviewed. Constitutional: She is oriented to person, place, and time. No distress.  Appears younger than stated age of 75  HENT:  Head: Normocephalic and atraumatic.  Eyes: Right eye exhibits no discharge. Left eye exhibits no discharge.  Neck: No JVD present.  Cardiovascular: Normal rate. An irregularly irregular rhythm present.  Respiratory: Effort normal. No respiratory distress. She has no wheezes.  GI: Bowel sounds are increased. There is abdominal tenderness. A hernia is present. Hernia confirmed positive in the ventral area.    Musculoskeletal:        General: No deformity or edema.  Neurological: She is alert and oriented to person, place, and time.  Skin: Skin is warm and dry. She is not diaphoretic.     Labs on Admission: I have personally reviewed following labs and imaging studies  CBC: Recent Labs  Lab 06/06/19 2102  WBC 7.6  NEUTROABS 5.8  HGB 14.0  HCT 44.1  MCV 89.1  PLT 99991111   Basic Metabolic Panel: Recent Labs  Lab 06/06/19 2102  NA 138  K 3.6  CL 98  CO2 29  GLUCOSE 137*  BUN 26*  CREATININE 0.77  CALCIUM 9.4   GFR: Estimated Creatinine Clearance: 33.9 mL/min (by C-G formula based on SCr of 0.77 mg/dL). Liver Function Tests: Recent Labs  Lab 06/06/19 2102  AST 22  ALT 12  ALKPHOS 115  BILITOT 0.4  PROT 8.2*  ALBUMIN 4.4   No results for input(s): LIPASE, AMYLASE in the last 168 hours. No results for input(s): AMMONIA in the last  168 hours. Coagulation Profile: No results for input(s): INR, PROTIME  in the last 168 hours. Cardiac Enzymes: No results for input(s): CKTOTAL, CKMB, CKMBINDEX, TROPONINI in the last 168 hours. BNP (last 3 results) No results for input(s): PROBNP in the last 8760 hours. HbA1C: No results for input(s): HGBA1C in the last 72 hours. CBG: No results for input(s): GLUCAP in the last 168 hours. Lipid Profile: No results for input(s): CHOL, HDL, LDLCALC, TRIG, CHOLHDL, LDLDIRECT in the last 72 hours. Thyroid Function Tests: No results for input(s): TSH, T4TOTAL, FREET4, T3FREE, THYROIDAB in the last 72 hours. Anemia Panel: No results for input(s): VITAMINB12, FOLATE, FERRITIN, TIBC, IRON, RETICCTPCT in the last 72 hours. Urine analysis:    Component Value Date/Time   COLORURINE YELLOW 01/30/2019 [place] 01/30/2019 1443   LABSPEC 1.012 01/30/2019 1443   PHURINE 6.0 01/30/2019 1443   GLUCOSEU NEGATIVE 01/30/2019 1443   GLUCOSEU NEGATIVE 01/16/2018 1300   HGBUR NEGATIVE 01/30/2019 1443   BILIRUBINUR NEGATIVE 01/30/2019 1443   BILIRUBINUR Negative 12/19/2017 1349   KETONESUR NEGATIVE 01/30/2019 1443   PROTEINUR NEGATIVE 01/30/2019 1443   UROBILINOGEN 0.2 01/16/2018 1300   NITRITE NEGATIVE 01/30/2019 1443   LEUKOCYTESUR NEGATIVE 01/30/2019 1443    Radiological Exams on Admission: I have personally reviewed images CT ABDOMEN PELVIS W CONTRAST  Result Date: 06/06/2019 CLINICAL DATA:  86 year old female with complicated hernia. EXAM: CT ABDOMEN AND PELVIS WITH CONTRAST TECHNIQUE: Multidetector CT imaging of the abdomen and pelvis was performed using the standard protocol following bolus administration of intravenous contrast. CONTRAST:  33mL OMNIPAQUE IOHEXOL 300 MG/ML  SOLN COMPARISON:  None. FINDINGS: Lower chest: The visualized lung bases are clear. There is moderate cardiomegaly. Partial calcification of the aortic annulus. No intra-abdominal free air or free fluid.  Hepatobiliary: The liver is unremarkable. Mild intrahepatic biliary ductal dilatation. The gallbladder is unremarkable. Pancreas: Unremarkable. No pancreatic ductal dilatation or surrounding inflammatory changes. Spleen: Normal in size without focal abnormality. Adrenals/Urinary Tract: The adrenal glands are unremarkable. There is no hydronephrosis on either side. There is symmetric enhancement and excretion of contrast by both kidneys. The urinary bladder is grossly unremarkable. Stomach/Bowel: There is moderate stool throughout the colon. There are scattered colonic diverticula without active inflammatory changes. There is a ventral infraumbilical hernia containing a segment of small bowel. The neck of the hernia defect measures approximately 12 mm. There is pinching of the bowel at the neck of the hernia with mild dilatation herniated segment and segment of the small bowel proximal to the hernia. The small bowel proximal to the hernia measures approximately 3 cm in diameter. There is mild associated inflammatory changes. There is a small hiatal hernia. The appendix is not visualized with certainty. No inflammatory changes identified in the right lower quadrant. Vascular/Lymphatic: Moderate aortoiliac atherosclerotic disease. The IVC is unremarkable. No portal venous gas. There is no adenopathy. Reproductive: The uterus is grossly unremarkable. No adnexal masses. Other: None Musculoskeletal: Osteopenia with degenerative changes of the spine and multilevel disc desiccation and vacuum phenomena. There is a total right hip arthroplasty. Vertebroplasty changes of the sacrum. No acute osseous pathology. IMPRESSION: 1. Ventral infraumbilical hernia containing a segment of small bowel with associated mild or early obstruction. 2. Colonic diverticulosis. No active inflammatory changes. 3. Aortic Atherosclerosis (ICD10-I70.0). Electronically Signed   By: Anner Crete M.D.   On: 06/06/2019 22:46    EKG: I have  personally reviewed EKG: rate controlled afib   Assessment/Plan Principal Problem:   Incarcerated ventral hernia Active Problems:   Essential hypertension   Atrial flutter ([place])  Chronic anticoagulation-Eliquis   Status post total replacement of right hip   DNR (do not resuscitate)    Incarcerated ventral hernia Pt has incarcerated hernia. Hernia could not be reduced by ER provider.  General surgery has been asked to see pt.  Per RCRI(revised cardiac risk index), pt has class 2 risk(10.1% risk of cardiac event).  However, pt had a total hip arthroplasty in 01/2019 that required general anesthesia and pt tolerated this procedure without complications.  Would continue to hold her Eliquis until after her surgery.  Last dose of Eliquis was the morning of 06-06-2019.  Package insert of Eliquis states that Eliquis should be held at least 24 hours prior to low bleeding risk procedures.  At least 48 hours for high bleeding risk procedures.  Ultimately, General surgery will need to assess the risk of bleeding for incarcerated hernia repair.  Pt does want surgery for her hernia.       Essential hypertension Stable.  Chronic anticoagulation-Eliquis Hold Eliquis until after her ventral hernia surgery.  Atrial flutter ([place]) Rate controlled at present time with lopressor.  Status post total replacement of right hip Stable.  DNR (do not resuscitate) Verified DNR status with patient.       DVT prophylaxis: SCDs Code Status: DNR/DNI(Do NOT Intubate) Family Communication: discussed with pt. Discussed with pt's son [name]  Disposition Plan: transfer to [facility] for surgery  Consults called: general surgery(Dr. Leighton Ruff) by ER provider   Kristopher Oppenheim, DO [facility] 06/07/2019, 12:00 AM

## 2019-06-08 DIAGNOSIS — R410 Disorientation, unspecified: Secondary | ICD-10-CM

## 2019-06-08 LAB — COMPREHENSIVE METABOLIC PANEL
ALT: 10 U/L (ref 0–44)
AST: 19 U/L (ref 15–41)
Albumin: 2.8 g/dL — ABNORMAL LOW (ref 3.5–5.0)
Alkaline Phosphatase: 71 U/L (ref 38–126)
Anion gap: 10 (ref 5–15)
BUN: 24 mg/dL — ABNORMAL HIGH (ref 8–23)
CO2: 25 mmol/L (ref 22–32)
Calcium: 8.3 mg/dL — ABNORMAL LOW (ref 8.9–10.3)
Chloride: 101 mmol/L (ref 98–111)
Creatinine, Ser: 0.86 mg/dL (ref 0.44–1.00)
GFR calc Af Amer: >60 mL/min (ref >60)
GFR calc non Af Amer: 59 mL/min — ABNORMAL LOW (ref >60)
Glucose, Bld: 145 mg/dL — ABNORMAL HIGH (ref 70–99)
Potassium: 3.7 mmol/L (ref 3.5–5.1)
Sodium: 136 mmol/L (ref 135–145)
Total Bilirubin: 0.9 mg/dL (ref 0.3–1.2)
Total Protein: 5.9 g/dL — ABNORMAL LOW (ref 6.5–8.1)

## 2019-06-08 LAB — CBC
HCT: 38.9 % (ref 36.0–46.0)
Hemoglobin: 12.5 g/dL (ref 12.0–15.0)
MCH: 28.3 pg (ref 26.0–34.0)
MCHC: 32.1 g/dL (ref 30.0–36.0)
MCV: 88 fL (ref 80.0–100.0)
Platelets: 200 10*3/uL (ref 150–400)
RBC: 4.42 MIL/uL (ref 3.87–5.11)
RDW: 14.4 % (ref 11.5–15.5)
WBC: 9.6 10*3/uL (ref 4.0–10.5)
nRBC: 0 % (ref 0.0–0.2)

## 2019-06-08 LAB — MAGNESIUM: Magnesium: 1.6 mg/dL — ABNORMAL LOW (ref 1.7–2.4)

## 2019-06-08 MED ORDER — CHLORHEXIDINE GLUCONATE CLOTH 2 % EX PADS
6.0000 | MEDICATED_PAD | Freq: Every day | CUTANEOUS | Status: DC
  Administered 2019-06-09 – 2019-06-11 (×3): 6 via TOPICAL

## 2019-06-08 MED ORDER — OXYCODONE HCL 5 MG PO TABS: 5.0000 mg | ORAL_TABLET | ORAL | Status: DC | PRN

## 2019-06-08 MED ORDER — MAGNESIUM SULFATE 2 GM/50ML IV SOLN
2.0000 g | Freq: Once | INTRAVENOUS | Status: AC
  Administered 2019-06-08: 2 g via INTRAVENOUS
  Filled 2019-06-08: qty 50

## 2019-06-08 NOTE — Progress Notes (Signed)
Pt in/out cathed with approximately 100 ml urine return.  Will continue to monitor.

## 2019-06-08 NOTE — Progress Notes (Signed)
Pt ambulated in room and transferred from her bed to the chair without difficulty.  Pt tolerated ambulation well.  Will continue to monitor.

## 2019-06-08 NOTE — Progress Notes (Signed)
PROGRESS NOTE  Glenda [name] [id number] DOB: [birth year]   PCP: Marin Olp, MD  Patient is from: SNF  DOA: 06/06/2019 LOS: 1  Brief Narrative / Interim history: 86 year old female with history of A. flutter on Eliquis, diastolic CHF, DDD, osteoarthritis, total right hip replacement, HTN, multiple abdominal surgeries and ventral hernia presenting with abdominal pain and nausea for about 2 days, and admitted for incarcerated ventral hernia as noted on CT abdomen and pelvis.  General surgery consulted.  She underwent open reduction and repair of obstructing incisional hernia with overlay mesh, and lysis of adhesion by Dr. Kieth Brightly on 06/07/2019.  Subjective: Seen and evaluated earlier this morning.  No major events overnight or this morning.  Reports improvement in her pain and nausea.  Denies chest pain, dyspnea or UTI symptoms.  Has not passed gas after surgery.  She is oriented x2 but seems to have fair insight.  Objective: Vitals:   06/07/19 2342 06/08/19 0300 06/08/19 0840 06/08/19 1519  BP: (!) 166/88 (!) 116/56 139/61 (!) 138/59  Pulse: 61 64 (!) 55 63  Resp: 19 16 14  (!) 23  Temp: 98.4 F (36.9 C) 98.2 F (36.8 C) 98.6 F (37 C) 98.2 F (36.8 C)  TempSrc: Oral Oral Oral Oral  SpO2: 94% 93% 95% 93%  Weight:      Height:        Intake/Output Summary (Last 24 hours) at 06/08/2019 1535 Last data filed at 06/08/2019 1000 Gross per 24 hour  Intake 944.49 ml  Output 291 ml  Net 653.49 ml   Filed Weights   06/06/19 2115 06/07/19 0527 06/07/19 1323  Weight: 52.2 kg 49.1 kg 49.1 kg    Examination:  GENERAL: No acute distress.  Appears well.  HEENT: MMM.  Vision and hearing grossly intact.  NECK: Supple.  No apparent JVD.  RESP:  No IWOB. Good air movement bilaterally. CVS:  RRR. Heart sounds normal.  ABD/GI/GU: Bowel sounds diminished. Soft.  Slight tenderness.  Dressing DCI MSK/EXT:  Moves extremities. No apparent deformity. No edema.  SKIN: no apparent  skin lesion or wound NEURO: Awake, alert and oriented x2.  No apparent focal neuro deficit. PSYCH: Calm. Normal affect.  Procedures:  3/18-open reduction and repair of obstructing incisional hernia with overlay mesh, and lysis of adhesion by Dr. Kieth Brightly  Assessment & Plan: Incarcerated infraumbilical hernia-tender and not reducible. -s/p open reduction and repair with overlay mesh and lysis of adhesion by Dr. Kieth Brightly on 3/18 -Eliquis on hold.  Will resume when okay from surgical standpoint -Analgesics and antiemetics -Cipro and Flagyl 3/18-3/19 -IVF and clears per surgery -OOB/PT/OT  A flutter: Currently rate controlled on IV metoprolol. -Continue IV metoprolol -Eliquis on hold-will resume when okay from surgical standpoint  Chronic diastolic CHF: Stable.  Appears euvolemic.  No cardiopulmonary symptoms.  On Lasix 30 mg daily at home.  -Hold IV Lasix -Continue gentle IV fluid -Monitor fluid status  Delirium-patient had an episode of agitation yesterday afternoon after surgery when the light went off due to bad weather.  She is calm now. -Frequent reorientation's and delirium precautions  Chronic pain/DJD/osteoarthritis-on Norco at home. -As needed morphine while n.p.o.  Essential HTN: Was hypotensive this morning.  Normotensive now -IV metoprolol and as needed hydralazine  History of breast cancer: Followed by Dr. Lindi Adie. -Resume anastrozole when able  Anxiety/depression: Stable -Resume home Zoloft and Restoril when able  Debility: Patient is from SNF. -PT/OT  DVT prophylaxis: SCD Code Status: DNR/DNI Family Communication: Patient and/or RN. Available if any question.   Discharge barrier: P.o. tolerance and clearance by surgery-she is currently on IV fluid and clears only. Patient is from: SNF Final disposition: Likely back to SNF  Consultants: General surgery   Microbiology summarized: -Influenza PCR negative -COVID-19 negative  Sch  Meds:  Scheduled Meds: . heparin injection (subcutaneous)  5,000 Units Subcutaneous Q8H  . metoprolol tartrate  2.5 mg Intravenous Q8H   Continuous Infusions: . lactated ringers 75 mL/hr at 06/08/19 0400   PRN Meds:.acetaminophen **OR** acetaminophen, hydrALAZINE, morphine injection, ondansetron **OR** ondansetron (ZOFRAN) IV, oxyCODONE, promethazine, white petrolatum  Antimicrobials: Anti-infectives (From admission, onward)   Start     Dose/Rate Route Frequency Ordered Stop   06/07/19 2200  metroNIDAZOLE (FLAGYL) IVPB 500 mg     500 mg 100 mL/hr over 60 Minutes Intravenous Every 8 hours 06/07/19 1714 06/08/19 0001   06/07/19 1800  ciprofloxacin (CIPRO) IVPB 400 mg     400 mg 200 mL/hr over 60 Minutes Intravenous Every 12 hours 06/07/19 1714 06/08/19 0654   06/07/19 0600  ciprofloxacin (CIPRO) IVPB 400 mg  Status:  Discontinued     400 mg 200 mL/hr over 60 Minutes Intravenous Every 12 hours 06/07/19 0517 06/07/19 1714   06/07/19 0530  metroNIDAZOLE (FLAGYL) IVPB 500 mg  Status:  Discontinued     500 mg 100 mL/hr over 60 Minutes Intravenous Every 8 hours 06/07/19 0517 06/07/19 1714       I have personally reviewed the following labs and images: CBC: Recent Labs  Lab 06/06/19 2102 06/07/19 0625 06/08/19 0306  WBC 7.6 7.4 9.6  NEUTROABS 5.8 5.9  --   HGB 14.0 13.2 12.5  HCT 44.1 40.7 38.9  MCV 89.1 87.9 88.0  PLT 205 219 200   BMP &GFR Recent Labs  Lab 06/06/19 2102 06/08/19 0306  NA 138 136  K 3.6 3.7  CL 98 101  CO2 29 25  GLUCOSE 137* 145*  BUN 26* 24*  CREATININE 0.77 0.86  CALCIUM 9.4 8.3*  MG  --  1.6*   Estimated Creatinine Clearance: 31.5 mL/min (by C-G formula based on SCr of 0.86 mg/dL). Liver & Pancreas: Recent Labs  Lab 06/06/19 2102 06/08/19 0306  AST 22 19  ALT 12 10  ALKPHOS 115 71  BILITOT 0.4 0.9  PROT 8.2* 5.9*  ALBUMIN 4.4 2.8*   No results for input(s): LIPASE, AMYLASE in the last 168 hours. No results for input(s): AMMONIA in  the last 168 hours. Diabetic: No results for input(s): HGBA1C in the last 72 hours. No results for input(s): GLUCAP in the last 168 hours. Cardiac Enzymes: No results for input(s): CKTOTAL, CKMB, CKMBINDEX, TROPONINI in the last 168 hours. No results for input(s): PROBNP in the last 8760 hours. Coagulation Profile: Recent Labs  Lab 06/07/19 0625  INR 1.2   Thyroid Function Tests: No results for input(s): TSH, T4TOTAL, FREET4, T3FREE, THYROIDAB in the last 72 hours. Lipid Profile: No results for input(s): CHOL, HDL, LDLCALC, TRIG, CHOLHDL, LDLDIRECT in the last 72 hours. Anemia Panel: No results for input(s): VITAMINB12, FOLATE, FERRITIN, TIBC, IRON, RETICCTPCT in the last 72 hours. Urine analysis:    Component Value Date/Time   COLORURINE YELLOW 01/30/2019 [place] 01/30/2019 1443   LABSPEC 1.012 01/30/2019 1443   PHURINE 6.0 01/30/2019 1443   GLUCOSEU NEGATIVE 01/30/2019 [place] 01/16/2018 1300   HGBUR NEGATIVE 01/30/2019 1443   BILIRUBINUR NEGATIVE  01/30/2019 1443   BILIRUBINUR Negative 12/19/2017 [place] 01/30/2019 1443   PROTEINUR NEGATIVE 01/30/2019 1443   UROBILINOGEN 0.2 01/16/2018 1300   NITRITE NEGATIVE 01/30/2019 1443   LEUKOCYTESUR NEGATIVE 01/30/2019 1443   Sepsis Labs: Invalid input(s): PROCALCITONIN, [place]  Microbiology: Recent Results (from the past 240 hour(s))  Respiratory Panel by RT PCR (Flu A&B, Covid) - Nasopharyngeal Swab     Status: None   Collection Time: 06/06/19  9:40 PM   Specimen: Nasopharyngeal Swab  Result Value Ref Range Status   SARS Coronavirus 2 by RT PCR NEGATIVE NEGATIVE Final    Comment: (NOTE) SARS-CoV-2 target nucleic acids are NOT DETECTED. The SARS-CoV-2 RNA is generally detectable in upper respiratoy specimens during the acute phase of infection. The lowest concentration of SARS-CoV-2 viral copies this assay can detect is 131 copies/mL. A negative result does not  preclude SARS-Cov-2 infection and should not be used as the sole basis for treatment or other patient management decisions. A negative result may occur with  improper specimen collection/handling, submission of specimen other than nasopharyngeal swab, presence of viral mutation(s) within the areas targeted by this assay, and inadequate number of viral copies (<131 copies/mL). A negative result must be combined with clinical observations, patient history, and epidemiological information. The expected result is Negative. Fact Sheet for Patients:  PinkCheek.be Fact Sheet for Healthcare Providers:  GravelBags.it This test is not yet ap proved or cleared by the [place] FDA and  has been authorized for detection and/or diagnosis of SARS-CoV-2 by FDA under an Emergency Use Authorization (EUA). This EUA will remain  in effect (meaning this test can be used) for the duration of the COVID-19 declaration under Section 564(b)(1) of the Act, 21 U.S.C. section 360bbb-3(b)(1), unless the authorization is terminated or revoked sooner.    Influenza A by PCR NEGATIVE NEGATIVE Final   Influenza B by PCR NEGATIVE NEGATIVE Final    Comment: (NOTE) The Xpert Xpress SARS-CoV-2/FLU/RSV assay is intended as an aid in  the diagnosis of influenza from Nasopharyngeal swab specimens and  should not be used as a sole basis for treatment. Nasal washings and  aspirates are unacceptable for Xpert Xpress SARS-CoV-2/FLU/RSV  testing. Fact Sheet for Patients: PinkCheek.be Fact Sheet for Healthcare Providers: GravelBags.it This test is not yet approved or cleared by the [place] FDA and  has been authorized for detection and/or diagnosis of SARS-CoV-2 by  FDA under an Emergency Use Authorization (EUA). This EUA will remain  in effect (meaning this test can be used) for the duration of the    Covid-19 declaration under Section 564(b)(1) of the Act, 21  U.S.C. section 360bbb-3(b)(1), unless the authorization is  terminated or revoked. Performed at [facility], [place] [address], [place], [place] [address]   Surgical pcr screen     Status: None   Collection Time: 06/07/19 12:45 PM   Specimen: Nasal Mucosa; Nasal Swab  Result Value Ref Range Status   MRSA, PCR NEGATIVE NEGATIVE Final   Staphylococcus aureus NEGATIVE NEGATIVE Final    Comment: (NOTE) The Xpert SA Assay (FDA approved for NASAL specimens in patients 25 years of age and older), is one component of a comprehensive surveillance program. It is not intended to diagnose infection nor to guide or monitor treatment. Performed at [facility], [place] [address] [place], [place] [address]     Radiology Studies: No results found.    Larose Batres T. [place]  If 7PM-7AM, please contact  night-coverage [email or web address] Password [facility] 06/08/2019, 3:35 PM

## 2019-06-08 NOTE — Progress Notes (Signed)
Attempted to bladder scan pt per MD order.  Pt has surgical dressing in place on top of bladder area.  Scan registers 0 x2.  Spoke to MD. Order to in/out cath again. Will continue to monitor.

## 2019-06-08 NOTE — Progress Notes (Addendum)
1 Day Post-Op  Subjective: CC: Doing well since surgery. She notes nausea has resolved. She did pull out foley and NGT yesterday. Denies any emesis. She is not passing flatus. She has only voided a small amount this morning and required an I/O with 100cc of output. She mobilized to bedside commode with help of nursing staff.   Objective: Vital signs in last 24 hours: Temp:  [97.9 F (36.6 C)-98.8 F (37.1 C)] 98.2 F (36.8 C) (03/19 0300) Pulse Rate:  [58-79] 64 (03/19 0300) Resp:  [13-21] 16 (03/19 0300) BP: (116-193)/(56-88) 116/56 (03/19 0300) SpO2:  [91 %-98 %] 93 % (03/19 0300) Weight:  [49.1 kg] 49.1 kg (03/18 1323) Last BM Date: 06/05/19  Intake/Output from previous day: 03/18 0701 - 03/19 0700 In: 1841.9 [I.V.:1441.9; IV Piggyback:400] Out: 716 [Urine:666; Blood:50] Intake/Output this shift: No intake/output data recorded.  PE: Gen:  Alert, NAD, pleasant HEENT: EOM's intact, pupils equal and round Pulm:  CTAB, no W/R/R, rate and effort normal Abd: Soft, ND, appropriately tender around midline incision as well as tenderness in lower abdomen. Midline wound with dressing in place. +BS Ext:  no BUE/BLE edema, calves soft and nontender  Lab Results:  Recent Labs    06/07/19 0625 06/08/19 0306  WBC 7.4 9.6  HGB 13.2 12.5  HCT 40.7 38.9  PLT 219 200   BMET Recent Labs    06/06/19 2102 06/08/19 0306  NA 138 136  K 3.6 3.7  CL 98 101  CO2 29 25  GLUCOSE 137* 145*  BUN 26* 24*  CREATININE 0.77 0.86  CALCIUM 9.4 8.3*   PT/INR Recent Labs    06/07/19 0625  LABPROT 14.9  INR 1.2   CMP     Component Value Date/Time   NA 136 06/08/2019 0306   NA 138 04/30/2019 1123   K 3.7 06/08/2019 0306   CL 101 06/08/2019 0306   CO2 25 06/08/2019 0306   GLUCOSE 145 (H) 06/08/2019 0306   BUN 24 (H) 06/08/2019 0306   BUN 23 04/30/2019 1123   CREATININE 0.86 06/08/2019 0306   CREATININE 1.28 (H) 08/02/2016 1420   CALCIUM 8.3 (L) 06/08/2019 0306   PROT 5.9  (L) 06/08/2019 0306   PROT 7.1 03/20/2018 1059   ALBUMIN 2.8 (L) 06/08/2019 0306   ALBUMIN 4.3 03/20/2018 1059   AST 19 06/08/2019 0306   ALT 10 06/08/2019 0306   ALKPHOS 71 06/08/2019 0306   BILITOT 0.9 06/08/2019 0306   BILITOT 0.5 03/20/2018 1059   GFRNONAA 59 (L) 06/08/2019 0306   GFRAA >60 06/08/2019 0306   Lipase  No results found for: LIPASE     Studies/Results: CT ABDOMEN PELVIS W CONTRAST  Result Date: 06/06/2019 CLINICAL DATA:  86 year old female with complicated hernia. EXAM: CT ABDOMEN AND PELVIS WITH CONTRAST TECHNIQUE: Multidetector CT imaging of the abdomen and pelvis was performed using the standard protocol following bolus administration of intravenous contrast. CONTRAST:  51mL OMNIPAQUE IOHEXOL 300 MG/ML  SOLN COMPARISON:  None. FINDINGS: Lower chest: The visualized lung bases are clear. There is moderate cardiomegaly. Partial calcification of the aortic annulus. No intra-abdominal free air or free fluid. Hepatobiliary: The liver is unremarkable. Mild intrahepatic biliary ductal dilatation. The gallbladder is unremarkable. Pancreas: Unremarkable. No pancreatic ductal dilatation or surrounding inflammatory changes. Spleen: Normal in size without focal abnormality. Adrenals/Urinary Tract: The adrenal glands are unremarkable. There is no hydronephrosis on either side. There is symmetric enhancement and excretion of contrast by both kidneys. The urinary bladder is grossly  unremarkable. Stomach/Bowel: There is moderate stool throughout the colon. There are scattered colonic diverticula without active inflammatory changes. There is a ventral infraumbilical hernia containing a segment of small bowel. The neck of the hernia defect measures approximately 12 mm. There is pinching of the bowel at the neck of the hernia with mild dilatation herniated segment and segment of the small bowel proximal to the hernia. The small bowel proximal to the hernia measures approximately 3 cm in  diameter. There is mild associated inflammatory changes. There is a small hiatal hernia. The appendix is not visualized with certainty. No inflammatory changes identified in the right lower quadrant. Vascular/Lymphatic: Moderate aortoiliac atherosclerotic disease. The IVC is unremarkable. No portal venous gas. There is no adenopathy. Reproductive: The uterus is grossly unremarkable. No adnexal masses. Other: None Musculoskeletal: Osteopenia with degenerative changes of the spine and multilevel disc desiccation and vacuum phenomena. There is a total right hip arthroplasty. Vertebroplasty changes of the sacrum. No acute osseous pathology. IMPRESSION: 1. Ventral infraumbilical hernia containing a segment of small bowel with associated mild or early obstruction. 2. Colonic diverticulosis. No active inflammatory changes. 3. Aortic Atherosclerosis (ICD10-I70.0). Electronically Signed   By: Anner Crete M.D.   On: 06/06/2019 22:46   DG Abd Portable 1V  Result Date: 06/07/2019 CLINICAL DATA:  Reported incarcerated ventral hernia EXAM: PORTABLE ABDOMEN - 1 VIEW COMPARISON:  CT abdomen and pelvis June 06, 2019 FINDINGS: There is no appreciable bowel dilatation or air-fluid level. No free air. Contrast is noted in the urinary bladder. There is a total hip replacement on the right. Visualized lung bases are clear. There is aortic atherosclerosis. Evidence of previous sacroplasty on the right superiorly. IMPRESSION: No apparent bowel obstruction by radiography. No appreciable bowel dilatation or air-fluid levels. No free air. Electronically Signed   By: Lowella Grip III M.D.   On: 06/07/2019 08:53    Anti-infectives: Anti-infectives (From admission, onward)   Start     Dose/Rate Route Frequency Ordered Stop   06/07/19 2200  metroNIDAZOLE (FLAGYL) IVPB 500 mg     500 mg 100 mL/hr over 60 Minutes Intravenous Every 8 hours 06/07/19 1714 06/08/19 0001   06/07/19 1800  ciprofloxacin (CIPRO) IVPB 400 mg     400  mg 200 mL/hr over 60 Minutes Intravenous Every 12 hours 06/07/19 1714 06/08/19 0654   06/07/19 0600  ciprofloxacin (CIPRO) IVPB 400 mg  Status:  Discontinued     400 mg 200 mL/hr over 60 Minutes Intravenous Every 12 hours 06/07/19 0517 06/07/19 1714   06/07/19 0530  metroNIDAZOLE (FLAGYL) IVPB 500 mg  Status:  Discontinued     500 mg 100 mL/hr over 60 Minutes Intravenous Every 8 hours 06/07/19 0517 06/07/19 1714       Assessment/Plan HTN Atrial flutter on eliquis at home Recent right THA 01/2019 Code status DNR  Incarcerated incisional hernia  - S/p open reduction and repair of obstructing incisional hernia with overlay mesh (mesh placed above the anterior fascia), lysis of adhesions - Dr. Kieth Brightly - 06/07/2019 - POD #1 - NGT out. Start sips of clears - Mobilize. PT - Pulm toilet, IS  ID - cipro/flagyl 3/18 - 3/19 FEN - IVF, sips of clears VTE - SCDs, subq heparin  Foley - removed 3/18. Bladder scan in 4 hours if has not voided   LOS: 1 day    Jillyn Ledger , [facility] Surgery 06/08/2019, 8:35 AM Please see Amion for pager number during day hours 7:00am-4:30pm

## 2019-06-08 NOTE — Progress Notes (Signed)
Pt only voided 16 cc the whole night and nurse unable to do bladder scan because of the abdominal dressing.  Informed on call physician and got an order for an In & out cath.  Only 100 cc of urine came out of the cath.  Will inform on coming nurse.  Lupita Dawn, RN

## 2019-06-08 NOTE — Evaluation (Signed)
Physical Therapy Evaluation Patient Details Name: Glenda [name] MRN: [id number] DOB: 06/19/1924 Today's Date: 06/08/2019   History of Present Illness  Patient is 86 year old female admitted with abdominal pain. S/P repair of incarcerated hernia. PMH includes: recent R THA (november), HTN, CHF, anxiety.  Clinical Impression  Patient received sitting up in recliner, son present. She agrees to PT assessment. Reports pain at IV site and in abdomen. Patient requires min assist to attain sitting up on the edge of recliner in preparation to stand. She requires min assist to stand using RW and cues for hand placement. Patient took 4 steps to [facility] then 4 steps back to recliner. Cues for safety. She will continue to benefit from skilled PT while here to improve strength and functional mobility for safe return to assisted living.     Follow Up Recommendations Home health PT;Supervision for mobility/OOB;Supervision - Intermittent    Equipment Recommendations  None recommended by PT    Recommendations for Other Services       Precautions / Restrictions Precautions Precautions: Fall Restrictions Weight Bearing Restrictions: No      Mobility  Bed Mobility               General bed mobility comments: no assessed, patient found in recliner upon arrival to room and remained in recliner  Transfers Overall transfer level: Needs assistance Equipment used: Rolling walker (2 wheeled) Transfers: Sit to/from Stand Sit to Stand: Min assist         General transfer comment: requires assist to scoot forward and bring shoulders forward to sitting up position in recliner. Verbal cues for safe hand placement with transitions.  Ambulation/Gait Ambulation/Gait assistance: Min assist Gait Distance (Feet): 4 Feet Assistive device: Rolling walker (2 wheeled) Gait Pattern/deviations: Step-to pattern;Narrow base of support;Decreased stride length;Decreased step length - right;Decreased step length -  left;Shuffle Gait velocity: decr   General Gait Details: patient able to take a few steps from recliner to [facility] and back to recliner. Cues for safety with mobility. Small, shuffle steps  Stairs            Wheelchair Mobility    Modified Rankin (Stroke Patients Only)       Balance Overall balance assessment: Needs assistance Sitting-balance support: Feet supported Sitting balance-Leahy Scale: Fair     Standing balance support: Bilateral upper extremity supported;During functional activity Standing balance-Leahy Scale: Fair Standing balance comment: relaint on RW and external assist for mobility                             Pertinent Vitals/Pain Pain Assessment: Faces Faces Pain Scale: Hurts little more Pain Location: abdomen Pain Descriptors / Indicators: Sore;Discomfort Pain Intervention(s): Monitored during session    Home Living Family/patient expects to be discharged to:: Assisted living               Home Equipment: Walker - 2 wheels;Walker - 4 wheels;Shower seat;Grab bars - toilet;Grab bars - tub/shower      Prior Function Level of Independence: Needs assistance   Gait / Transfers Assistance Needed: patient has been receiving PT at assisted living since hip replacement this fall. Patient gets assist from staff as needed.  ADL's / Homemaking Assistance Needed: Pt had min A with showering and supervision with toielting and dressing  Comments: ambulates with rollator     Hand Dominance   Dominant Hand: Right    Extremity/Trunk Assessment   Upper Extremity Assessment Upper  Extremity Assessment: Generalized weakness    Lower Extremity Assessment Lower Extremity Assessment: Generalized weakness    Cervical / Trunk Assessment Cervical / Trunk Assessment: Normal  Communication   Communication: No difficulties  Cognition Arousal/Alertness: Awake/alert   Overall Cognitive Status: Impaired/Different from baseline Area of Impairment:  Awareness;Problem solving                             Problem Solving: Slow processing;Requires verbal cues;Difficulty sequencing General Comments: patient is slightly off, and not clear since surgery per son.      General Comments      Exercises     Assessment/Plan    PT Assessment Patient needs continued PT services  PT Problem List Decreased strength;Decreased mobility;Decreased safety awareness;Decreased activity tolerance;Decreased balance;Decreased cognition;Pain       PT Treatment Interventions DME instruction;Therapeutic exercise;Gait training;Balance training;Functional mobility training;Therapeutic activities;Patient/family education    PT Goals (Current goals can be found in the Care Plan section)  Acute Rehab PT Goals Patient Stated Goal: hopeful to return to assisted living at discharge PT Goal Formulation: With patient/family Time For Goal Achievement: 06/22/19 Potential to Achieve Goals: Fair    Frequency Min 3X/week   Barriers to discharge Decreased caregiver support      Co-evaluation               AM-PAC PT "6 Clicks" Mobility  Outcome Measure Help needed turning from your back to your side while in a flat bed without using bedrails?: A Lot Help needed moving from lying on your back to sitting on the side of a flat bed without using bedrails?: A Lot Help needed moving to and from a bed to a chair (including a wheelchair)?: A Little Help needed standing up from a chair using your arms (e.g., wheelchair or bedside chair)?: A Little Help needed to walk in hospital room?: A Little Help needed climbing 3-5 steps with a railing? : A Lot 6 Click Score: 15    End of Session Equipment Utilized During Treatment: Gait belt Activity Tolerance: Patient tolerated treatment well Patient left: in chair;with call bell/phone within reach;with family/visitor present Nurse Communication: Mobility status;Other (comment)(inability to urinate) PT Visit  Diagnosis: Other abnormalities of gait and mobility (R26.89);Muscle weakness (generalized) (M62.81);Difficulty in walking, not elsewhere classified (R26.2);History of falling (Z91.81);Pain Pain - part of body: (abdomen)    Time: 1125-1150 PT Time Calculation (min) (ACUTE ONLY): 25 min   Charges:   PT Evaluation $PT Eval Moderate Complexity: 1 Mod PT Treatments $Therapeutic Activity: 8-22 mins        Ernest Orr, PT, GCS 06/08/19,12:25 PM

## 2019-06-08 NOTE — Plan of Care (Signed)
  Problem: Education: Goal: Knowledge of General Education information will improve Description: Including pain rating scale, medication(s)/side effects and non-pharmacologic comfort measures Outcome: Not Progressing   Problem: Health Behavior/Discharge Planning: Goal: Ability to manage health-related needs will improve Outcome: Not Progressing   

## 2019-06-09 LAB — BASIC METABOLIC PANEL
Anion gap: 9 (ref 5–15)
BUN: 23 mg/dL (ref 8–23)
CO2: 24 mmol/L (ref 22–32)
Calcium: 7.9 mg/dL — ABNORMAL LOW (ref 8.9–10.3)
Chloride: 104 mmol/L (ref 98–111)
Creatinine, Ser: 0.79 mg/dL (ref 0.44–1.00)
GFR calc Af Amer: >60 mL/min (ref >60)
GFR calc non Af Amer: >60 mL/min (ref >60)
Glucose, Bld: 99 mg/dL (ref 70–99)
Potassium: 4.1 mmol/L (ref 3.5–5.1)
Sodium: 137 mmol/L (ref 135–145)

## 2019-06-09 LAB — HEMOGLOBIN AND HEMATOCRIT, BLOOD
HCT: 34.1 % — ABNORMAL LOW (ref 36.0–46.0)
Hemoglobin: 10.8 g/dL — ABNORMAL LOW (ref 12.0–15.0)

## 2019-06-09 LAB — MAGNESIUM: Magnesium: 2.1 mg/dL (ref 1.7–2.4)

## 2019-06-09 MED ORDER — BETHANECHOL CHLORIDE 10 MG PO TABS
10.0000 mg | ORAL_TABLET | Freq: Three times a day (TID) | ORAL | Status: DC
  Administered 2019-06-09 – 2019-06-10 (×6): 10 mg via ORAL
  Filled 2019-06-09 (×7): qty 1

## 2019-06-09 MED ORDER — ANASTROZOLE 1 MG PO TABS
1.0000 mg | ORAL_TABLET | Freq: Every day | ORAL | Status: DC
  Administered 2019-06-10 – 2019-06-13 (×4): 1 mg via ORAL
  Filled 2019-06-09 (×4): qty 1

## 2019-06-09 MED ORDER — TEMAZEPAM 7.5 MG PO CAPS
7.5000 mg | ORAL_CAPSULE | Freq: Every day | ORAL | Status: DC
  Administered 2019-06-09 – 2019-06-12 (×4): 7.5 mg via ORAL
  Filled 2019-06-09 (×4): qty 1

## 2019-06-09 NOTE — Progress Notes (Signed)
2 Days Post-Op  Subjective: CC: Foley placed overnight for urinary retention.  Patient reports mild abdominal pain to the left of her incision.  She is unsure if she is passed any flatus.  No BM.  She denies any nausea since surgery.  She is tolerating clear liquids.  Patient has oriented to self, time, place(lknows she is at [facility]) and situation however continues to asked why her room does not look like [facility].  Objective: Vital signs in last 24 hours: Temp:  [98.2 F (36.8 C)-98.9 F (37.2 C)] 98.2 F (36.8 C) (03/20 0737) Pulse Rate:  [57-63] 61 (03/20 0737) Resp:  [19-23] 19 (03/20 0737) BP: (132-141)/(59-61) 138/59 (03/20 0737) SpO2:  [92 %-94 %] 92 % (03/20 0737) Last BM Date: 06/05/19  Intake/Output from previous day: 03/19 0701 - 03/20 0700 In: 1800.4 [P.O.:60; I.V.:1740.4] Out: 425 [Urine:425] Intake/Output this shift: No intake/output data recorded.  PE: Gen: Alert, NAD, pleasant HEENT: EOM's intact, pupils equal and round Pulm: CTAB, no W/R/R, rate and effort normal Abd: Soft,ND, mild firmness to the left upper portion of the midline incision without skin changes. She is appropriately tender around midline incision as well as tenderness in lower abdomen. Midline wound c/d/i. +BS Ext: no BUE/BLE edema, calves soft and nontender  Lab Results:  Recent Labs    06/07/19 0625 06/07/19 0625 06/08/19 0306 06/09/19 0918  WBC 7.4  --  9.6  --   HGB 13.2   < > 12.5 10.8*  HCT 40.7   < > 38.9 34.1*  PLT 219  --  200  --    < > = values in this interval not displayed.   BMET Recent Labs    06/06/19 2102 06/08/19 0306  NA 138 136  K 3.6 3.7  CL 98 101  CO2 29 25  GLUCOSE 137* 145*  BUN 26* 24*  CREATININE 0.77 0.86  CALCIUM 9.4 8.3*   PT/INR Recent Labs    06/07/19 0625  LABPROT 14.9  INR 1.2   CMP     Component Value Date/Time   NA 136 06/08/2019 0306   NA 138 04/30/2019 1123   K 3.7 06/08/2019 0306   CL 101 06/08/2019 0306   CO2 25 06/08/2019 0306   GLUCOSE 145 (H) 06/08/2019 0306   BUN 24 (H) 06/08/2019 0306   BUN 23 04/30/2019 1123   CREATININE 0.86 06/08/2019 0306   CREATININE 1.28 (H) 08/02/2016 1420   CALCIUM 8.3 (L) 06/08/2019 0306   PROT 5.9 (L) 06/08/2019 0306   PROT 7.1 03/20/2018 1059   ALBUMIN 2.8 (L) 06/08/2019 0306   ALBUMIN 4.3 03/20/2018 1059   AST 19 06/08/2019 0306   ALT 10 06/08/2019 0306   ALKPHOS 71 06/08/2019 0306   BILITOT 0.9 06/08/2019 0306   BILITOT 0.5 03/20/2018 1059   GFRNONAA 59 (L) 06/08/2019 0306   GFRAA >60 06/08/2019 0306   Lipase  No results found for: LIPASE     Studies/Results: No results found.  Anti-infectives: Anti-infectives (From admission, onward)   Start     Dose/Rate Route Frequency Ordered Stop   06/07/19 2200  metroNIDAZOLE (FLAGYL) IVPB 500 mg     500 mg 100 mL/hr over 60 Minutes Intravenous Every 8 hours 06/07/19 1714 06/08/19 0001   06/07/19 1800  ciprofloxacin (CIPRO) IVPB 400 mg     400 mg 200 mL/hr over 60 Minutes Intravenous Every 12 hours 06/07/19 1714 06/08/19 0654   06/07/19 0600  ciprofloxacin (CIPRO) IVPB 400 mg  Status:  Discontinued     400 mg 200 mL/hr over 60 Minutes Intravenous Every 12 hours 06/07/19 0517 06/07/19 1714   06/07/19 0530  metroNIDAZOLE (FLAGYL) IVPB 500 mg  Status:  Discontinued     500 mg 100 mL/hr over 60 Minutes Intravenous Every 8 hours 06/07/19 0517 06/07/19 1714       Assessment/Plan HTN Atrial flutter on eliquis at home Recent right THA 01/2019 Code status DNR  Incarcerated incisional hernia -S/p open reduction and repair of obstructing incisional hernia with overlay mesh (mesh placed above the anterior fascia), lysis of adhesions - Dr. Kieth Brightly - 06/07/2019 - POD #2 - FLD. Await ROBF before adv further  - Mobilize. PT - Pulm toilet, IS  ID -cipro/flagyl 3/18 - 3/19 FEN -IVF, FLD VTE -SCDs, subq heparin, await stabilization of hgb before restarting Eliquis. Hgb 10.8 from 12.5 this am.  Am CBC  Foley - removed 3/18. Replaced 3/19 for urinary retention. Urecholine    LOS: 2 days    Jillyn Ledger , [facility] 06/09/2019, 9:54 AM Please see Amion for pager number during day hours 7:00am-4:30pm

## 2019-06-09 NOTE — Progress Notes (Signed)
PROGRESS NOTE  Glenda [name] [id number] DOB: 05/30/1924   PCP: Marin Olp, MD  Patient is from: SNF  DOA: 06/06/2019 LOS: 2  Brief Narrative / Interim history: 86 year old female with history of A. flutter on Eliquis, diastolic CHF, DDD, osteoarthritis, total right hip replacement, HTN, multiple abdominal surgeries and ventral hernia presenting with abdominal pain and nausea for about 2 days, and admitted for incarcerated ventral hernia as noted on CT abdomen and pelvis.  General surgery consulted.  She underwent open reduction and repair of obstructing incisional hernia with overlay mesh, and lysis of adhesion by Dr. Kieth Brightly on 06/07/2019.  Subjective: Seen and examined earlier this morning.  No major events overnight of this morning.  Reportedly not able to void and has to had indwelling Foley replaced.  She is somewhat confused and delirious.  Asking why she has a lot of furniture syndrome.  However, she understand that she is at [facility].  No distress or agitation.  She denies chest pain, dyspnea, nausea, vomiting or abdominal pain.  Objective: Vitals:   06/09/19 0100 06/09/19 0423 06/09/19 0737 06/09/19 1210  BP:  132/61 (!) 138/59 (!) 131/54  Pulse:  61 61 (!) 48  Resp:  (!) 23 19 15   Temp:  98.2 F (36.8 C) 98.2 F (36.8 C) 98.3 F (36.8 C)  TempSrc:  Oral Oral Oral  SpO2: 94% 94% 92% 96%  Weight:      Height:        Intake/Output Summary (Last 24 hours) at 06/09/2019 1502 Last data filed at 06/09/2019 0600 Gross per 24 hour  Intake 1740.37 ml  Output 300 ml  Net 1440.37 ml   Filed Weights   06/06/19 2115 06/07/19 0527 06/07/19 1323  Weight: 52.2 kg 49.1 kg 49.1 kg    Examination:  GENERAL: No acute distress.  Appears well.  HEENT: MMM.  Vision and hearing grossly intact.  NECK: Supple.  No apparent JVD.  RESP:  No IWOB. Good air movement bilaterally. CVS:  RRR. Heart sounds normal.  ABD/GI/GU: Diminished BS.  Soft.  Slight  tenderness.  Dressing DCI.  Indwelling Foley. MSK/EXT:  Moves extremities. No apparent deformity. No edema.  SKIN: Surgical wound over abdomen. NEURO: Awake, alert and oriented x2 but confused and delirious.  No apparent focal neuro deficit. PSYCH: Calm but confused.  Procedures:  3/18-open reduction and repair of obstructing incisional hernia with overlay mesh, and lysis of adhesion by Dr. Kieth Brightly  Assessment & Plan: Incarcerated infraumbilical hernia-tender and not reducible. -s/p open reduction and repair with overlay mesh and lysis of adhesion by Dr. Kieth Brightly on 3/18 -Eliquis on hold.  Will resume when okay from surgical standpoint -Analgesics and antiemetics -Cipro and Flagyl 3/18-3/19 -Full liquid diet per surgery. -OOB/PT/OT  A flutter: Currently rate controlled on IV metoprolol. -Continue IV metoprolol -Eliquis on hold-will resume when okay from surgical standpoint  Chronic diastolic CHF: Stable.  Appears euvolemic.  No cardiopulmonary symptoms.  On Lasix 30 mg daily at home.  -Continue holding Lasix -Monitor fluid status  Delirium-oriented to self and place but confused. -Frequent reorientation's and delirium precautions  Chronic pain/DJD/osteoarthritis-on Norco at home. -As needed morphine while n.p.o.  Essential HTN: Was hypotensive this morning.  Normotensive now -IV metoprolol and as needed hydralazine  Acute urinary retention-likely due to opiates. -Agree with bethanechol 10 mg 3 times daily -try voiding trial on 3/21 if stable.  Normocytic anemia: Likely a combination of hemodilution from IV fluid and blood loss from blood draws -Reduce  IV fluid now she is a full liquid diet. -Continue monitoring  History of breast cancer: Followed by Dr. Lindi Adie. -Resume anastrozole when able  Anxiety/depression/insomnia: Stable -Resume home Zoloft, Restoril when able  Debility: Patient is from SNF. -PT/OT               DVT prophylaxis: Subq heparin Code  Status: DNR/DNI Family Communication: Updated patient's son, [name] over the phone.  Discharge barrier: P.o. tolerance and clearance by surgery-she is currently on IV fluid and full liquid diet Patient is from: SNF Final disposition: Likely back to SNF  Consultants: General surgery   Microbiology summarized: -Influenza PCR negative -COVID-19 negative  Sch Meds:  Scheduled Meds: . bethanechol  10 mg Oral TID  . Chlorhexidine Gluconate Cloth  6 each Topical Q0600  . heparin injection (subcutaneous)  5,000 Units Subcutaneous Q8H  . metoprolol tartrate  2.5 mg Intravenous Q8H   Continuous Infusions: . lactated ringers 75 mL/hr at 06/09/19 0600   PRN Meds:.acetaminophen **OR** acetaminophen, hydrALAZINE, morphine injection, ondansetron **OR** ondansetron (ZOFRAN) IV, oxyCODONE, promethazine, white petrolatum  Antimicrobials: Anti-infectives (From admission, onward)   Start     Dose/Rate Route Frequency Ordered Stop   06/07/19 2200  metroNIDAZOLE (FLAGYL) IVPB 500 mg     500 mg 100 mL/hr over 60 Minutes Intravenous Every 8 hours 06/07/19 1714 06/08/19 0001   06/07/19 1800  ciprofloxacin (CIPRO) IVPB 400 mg     400 mg 200 mL/hr over 60 Minutes Intravenous Every 12 hours 06/07/19 1714 06/08/19 0654   06/07/19 0600  ciprofloxacin (CIPRO) IVPB 400 mg  Status:  Discontinued     400 mg 200 mL/hr over 60 Minutes Intravenous Every 12 hours 06/07/19 0517 06/07/19 1714   06/07/19 0530  metroNIDAZOLE (FLAGYL) IVPB 500 mg  Status:  Discontinued     500 mg 100 mL/hr over 60 Minutes Intravenous Every 8 hours 06/07/19 0517 06/07/19 1714       I have personally reviewed the following labs and images: CBC: Recent Labs  Lab 06/06/19 2102 06/07/19 0625 06/08/19 0306 06/09/19 0918  WBC 7.6 7.4 9.6  --   NEUTROABS 5.8 5.9  --   --   HGB 14.0 13.2 12.5 10.8*  HCT 44.1 40.7 38.9 34.1*  MCV 89.1 87.9 88.0  --   PLT 205 219 200  --    BMP &GFR Recent Labs  Lab 06/06/19 2102  06/08/19 0306 06/09/19 0918  NA 138 136 137  K 3.6 3.7 4.1  CL 98 101 104  CO2 29 25 24   GLUCOSE 137* 145* 99  BUN 26* 24* 23  CREATININE 0.77 0.86 0.79  CALCIUM 9.4 8.3* 7.9*  MG  --  1.6* 2.1   Estimated Creatinine Clearance: 33.9 mL/min (by C-G formula based on SCr of 0.79 mg/dL). Liver & Pancreas: Recent Labs  Lab 06/06/19 2102 06/08/19 0306  AST 22 19  ALT 12 10  ALKPHOS 115 71  BILITOT 0.4 0.9  PROT 8.2* 5.9*  ALBUMIN 4.4 2.8*   No results for input(s): LIPASE, AMYLASE in the last 168 hours. No results for input(s): AMMONIA in the last 168 hours. Diabetic: No results for input(s): HGBA1C in the last 72 hours. No results for input(s): GLUCAP in the last 168 hours. Cardiac Enzymes: No results for input(s): CKTOTAL, CKMB, CKMBINDEX, TROPONINI in the last 168 hours. No results for input(s): PROBNP in the last 8760 hours. Coagulation Profile: Recent Labs  Lab 06/07/19 0625  INR 1.2   Thyroid Function Tests: No results  for input(s): TSH, T4TOTAL, FREET4, T3FREE, THYROIDAB in the last 72 hours. Lipid Profile: No results for input(s): CHOL, HDL, LDLCALC, TRIG, CHOLHDL, LDLDIRECT in the last 72 hours. Anemia Panel: No results for input(s): VITAMINB12, FOLATE, FERRITIN, TIBC, IRON, RETICCTPCT in the last 72 hours. Urine analysis:    Component Value Date/Time   COLORURINE YELLOW 01/30/2019 [place] 01/30/2019 1443   LABSPEC 1.012 01/30/2019 1443   PHURINE 6.0 01/30/2019 [place] 01/30/2019 [place] 01/16/2018 1300   HGBUR NEGATIVE 01/30/2019 [place] 01/30/2019 1443   BILIRUBINUR Negative 12/19/2017 [place] 01/30/2019 1443   PROTEINUR NEGATIVE 01/30/2019 1443   UROBILINOGEN 0.2 01/16/2018 1300   NITRITE NEGATIVE 01/30/2019 1443   LEUKOCYTESUR NEGATIVE 01/30/2019 1443   Sepsis Labs: Invalid input(s): PROCALCITONIN, [place]  Microbiology: Recent Results (from the past  240 hour(s))  Respiratory Panel by RT PCR (Flu A&B, Covid) - Nasopharyngeal Swab     Status: None   Collection Time: 06/06/19  9:40 PM   Specimen: Nasopharyngeal Swab  Result Value Ref Range Status   SARS Coronavirus 2 by RT PCR NEGATIVE NEGATIVE Final    Comment: (NOTE) SARS-CoV-2 target nucleic acids are NOT DETECTED. The SARS-CoV-2 RNA is generally detectable in upper respiratoy specimens during the acute phase of infection. The lowest concentration of SARS-CoV-2 viral copies this assay can detect is 131 copies/mL. A negative result does not preclude SARS-Cov-2 infection and should not be used as the sole basis for treatment or other patient management decisions. A negative result may occur with  improper specimen collection/handling, submission of specimen other than nasopharyngeal swab, presence of viral mutation(s) within the areas targeted by this assay, and inadequate number of viral copies (<131 copies/mL). A negative result must be combined with clinical observations, patient history, and epidemiological information. The expected result is Negative. Fact Sheet for Patients:  PinkCheek.be Fact Sheet for Healthcare Providers:  GravelBags.it This test is not yet ap proved or cleared by the [place] FDA and  has been authorized for detection and/or diagnosis of SARS-CoV-2 by FDA under an Emergency Use Authorization (EUA). This EUA will remain  in effect (meaning this test can be used) for the duration of the COVID-19 declaration under Section 564(b)(1) of the Act, 21 U.S.C. section 360bbb-3(b)(1), unless the authorization is terminated or revoked sooner.    Influenza A by PCR NEGATIVE NEGATIVE Final   Influenza B by PCR NEGATIVE NEGATIVE Final    Comment: (NOTE) The Xpert Xpress SARS-CoV-2/FLU/RSV assay is intended as an aid in  the diagnosis of influenza from Nasopharyngeal swab specimens and  should not be used  as a sole basis for treatment. Nasal washings and  aspirates are unacceptable for Xpert Xpress SARS-CoV-2/FLU/RSV  testing. Fact Sheet for Patients: PinkCheek.be Fact Sheet for Healthcare Providers: GravelBags.it This test is not yet approved or cleared by the [place] FDA and  has been authorized for detection and/or diagnosis of SARS-CoV-2 by  FDA under an Emergency Use Authorization (EUA). This EUA will remain  in effect (meaning this test can be used) for the duration of the  Covid-19 declaration under Section 564(b)(1) of the Act, 21  U.S.C. section 360bbb-3(b)(1), unless the authorization is  terminated or revoked. Performed at [facility], [place] [address] [place], [place] [address]   Surgical pcr screen     Status: None   Collection Time: 06/07/19 12:45 PM   Specimen: Nasal Mucosa; Nasal  Swab  Result Value Ref Range Status   MRSA, PCR NEGATIVE NEGATIVE Final   Staphylococcus aureus NEGATIVE NEGATIVE Final    Comment: (NOTE) The Xpert SA Assay (FDA approved for NASAL specimens in patients 27 years of age and older), is one component of a comprehensive surveillance program. It is not intended to diagnose infection nor to guide or monitor treatment. Performed at [facility], [place] [address], [place], [place] [address]     Radiology Studies: No results found.    Jacy Brocker T. [place]  If 7PM-7AM, please contact night-coverage [email or web address] Password [email or web address] 06/09/2019, 3:02 PM

## 2019-06-09 NOTE — Progress Notes (Signed)
Physical Therapy Treatment Patient Details Name: Glenda [name] MRN: [id number] DOB: January 31, 1925 Today's Date: 06/09/2019    History of Present Illness Patient is 86 year old female admitted with abdominal pain. S/P repair of incarcerated hernia. PMH includes: recent R THA (november), HTN, CHF, anxiety.    PT Comments    Pt did well with mobility and tx this am, She was very preoccupied with her ALF being sold and losing all her belongings. Therapist attempted to redirect and reorient throughout session but returned to this and how upset she was by it all. Functionally did well, apprehensive about mobility but able to get from bed with mod a, sat unsupported edge of bed approx 10 mins with UE activity and no loss of balance. Was able to ambulate approx 31ft from bed to recliner with RW and min a. Overall functionally making gains but due to cognition will need oob supervision at dc.     Follow Up Recommendations  Home health PT;Supervision for mobility/OOB;Supervision - Intermittent     Equipment Recommendations  None recommended by PT    Recommendations for Other Services       Precautions / Restrictions Precautions Precautions: Fall Restrictions Weight Bearing Restrictions: No    Mobility  Bed Mobility Overal bed mobility: Needs Assistance Bed Mobility: Sit to Supine       Sit to supine: Mod assist      Transfers Overall transfer level: Needs assistance Equipment used: Rolling walker (2 wheeled) Transfers: Sit to/from [facility] Sit to Stand: Min assist;Mod assist Stand pivot transfers: Min assist          Ambulation/Gait Ambulation/Gait assistance: Min assist Gait Distance (Feet): 5 Feet Assistive device: Rolling walker (2 wheeled) Gait Pattern/deviations: Step-to pattern;Narrow base of support;Decreased stride length;Decreased step length - right;Decreased step length - left;Shuffle Gait velocity: decr   General Gait Details: few steps  from bed to recliner   Stairs             Wheelchair Mobility    Modified Rankin (Stroke Patients Only)       Balance Overall balance assessment: Needs assistance Sitting-balance support: Feet supported Sitting balance-Leahy Scale: Good Sitting balance - Comments: able to sit edge of bed with no UE support and not lose balance, reaches out of base of support also   Standing balance support: During functional activity;Bilateral upper extremity supported Standing balance-Leahy Scale: Fair                              Cognition Arousal/Alertness: Awake/alert Behavior During Therapy: Anxious Overall Cognitive Status: Impaired/Different from baseline Area of Impairment: Awareness;Problem solving;Safety/judgement;Orientation;Attention;Memory                 Orientation Level: Disoriented to;Place;Time;Situation Current Attention Level: Alternating Memory: Decreased recall of precautions;Decreased short-term memory   Safety/Judgement: Decreased awareness of safety;Decreased awareness of deficits Awareness: Intellectual Problem Solving: Slow processing;Difficulty sequencing;Requires verbal cues General Comments: knows she has surgery recently but at times confuses where she is and what is happening, is preoccupied with her ALF being sold and everything changing, her not having all her clothes and make up due to place being sold while she was away getting a coke. very confused narrative and difficult to reorient      Exercises      General Comments        Pertinent Vitals/Pain Pain Assessment: Faces Faces Pain Scale: Hurts a little bit Pain Location: abdomen  Pain Descriptors / Indicators: Discomfort;Guarding Pain Intervention(s): Limited activity within patient's tolerance    Home Living Family/patient expects to be discharged to:: Assisted living                    Prior Function            PT Goals (current goals can now be found  in the care plan section) Acute Rehab PT Goals Patient Stated Goal: to go home, but confused about where that is and if its still the same as previous PT Goal Formulation: With patient/family Time For Goal Achievement: 06/22/19 Potential to Achieve Goals: Fair Progress towards PT goals: Progressing toward goals    Frequency    Min 3X/week      PT Plan Current plan remains appropriate    Co-evaluation              AM-PAC PT "6 Clicks" Mobility   Outcome Measure  Help needed turning from your back to your side while in a flat bed without using bedrails?: A Lot Help needed moving from lying on your back to sitting on the side of a flat bed without using bedrails?: A Lot Help needed moving to and from a bed to a chair (including a wheelchair)?: A Little Help needed standing up from a chair using your arms (e.g., wheelchair or bedside chair)?: A Little Help needed to walk in hospital room?: A Little Help needed climbing 3-5 steps with a railing? : A Lot 6 Click Score: 15    End of Session Equipment Utilized During Treatment: Gait belt Activity Tolerance: Patient limited by fatigue Patient left: in chair;with call bell/phone within reach;with chair alarm set Nurse Communication: Mobility status PT Visit Diagnosis: Other abnormalities of gait and mobility (R26.89);Muscle weakness (generalized) (M62.81);Difficulty in walking, not elsewhere classified (R26.2);History of falling (Z91.81);Pain     Time: [id number] PT Time Calculation (min) (ACUTE ONLY): 31 min  Charges:  $Gait Training: 8-22 mins $Therapeutic Activity: 8-22 mins                     Horald Chestnut, PT    Delford Field 06/09/2019, 2:07 PM

## 2019-06-09 NOTE — Progress Notes (Signed)
Patient unable to void. Placed foley per MD order. Patient tolerated well. Resting comforably

## 2019-06-10 DIAGNOSIS — D62 Acute posthemorrhagic anemia: Secondary | ICD-10-CM

## 2019-06-10 LAB — CBC
HCT: 29.6 % — ABNORMAL LOW (ref 36.0–46.0)
Hemoglobin: 9.5 g/dL — ABNORMAL LOW (ref 12.0–15.0)
MCH: 28.8 pg (ref 26.0–34.0)
MCHC: 32.1 g/dL (ref 30.0–36.0)
MCV: 89.7 fL (ref 80.0–100.0)
Platelets: 149 10*3/uL — ABNORMAL LOW (ref 150–400)
RBC: 3.3 MIL/uL — ABNORMAL LOW (ref 3.87–5.11)
RDW: 14.6 % (ref 11.5–15.5)
WBC: 7 10*3/uL (ref 4.0–10.5)
nRBC: 0 % (ref 0.0–0.2)

## 2019-06-10 LAB — IRON AND TIBC
Iron: 35 ug/dL (ref 28–170)
Saturation Ratios: 22 % (ref 10.4–31.8)
TIBC: 157 ug/dL — ABNORMAL LOW (ref 250–450)
UIBC: 122 ug/dL

## 2019-06-10 LAB — VITAMIN B12: Vitamin B-12: 447 pg/mL (ref 180–914)

## 2019-06-10 LAB — FOLATE: Folate: 19.1 ng/mL (ref >5.9)

## 2019-06-10 LAB — RETICULOCYTES
Immature Retic Fract: 1.3 % — ABNORMAL LOW (ref 2.3–15.9)
RBC.: 3.29 MIL/uL — ABNORMAL LOW (ref 3.87–5.11)
Retic Count, Absolute: 26.6 10*3/uL (ref 19.0–186.0)
Retic Ct Pct: 0.8 % (ref 0.4–3.1)

## 2019-06-10 LAB — FERRITIN: Ferritin: 402 ng/mL — ABNORMAL HIGH (ref 11–307)

## 2019-06-10 MED ORDER — SERTRALINE HCL 25 MG PO TABS
25.0000 mg | ORAL_TABLET | Freq: Every day | ORAL | Status: DC
  Administered 2019-06-10 – 2019-06-12 (×3): 25 mg via ORAL
  Filled 2019-06-10 (×3): qty 1

## 2019-06-10 MED ORDER — FUROSEMIDE 20 MG PO TABS
20.0000 mg | ORAL_TABLET | Freq: Every day | ORAL | Status: DC
  Administered 2019-06-10 – 2019-06-13 (×4): 20 mg via ORAL
  Filled 2019-06-10 (×4): qty 1

## 2019-06-10 MED ORDER — HYDROCODONE-ACETAMINOPHEN 5-325 MG PO TABS
1.0000 | ORAL_TABLET | Freq: Three times a day (TID) | ORAL | Status: DC | PRN
  Administered 2019-06-10 – 2019-06-13 (×5): 1 via ORAL
  Filled 2019-06-10 (×5): qty 1

## 2019-06-10 NOTE — Progress Notes (Signed)
Physician was made aware of patients bradycardia overnight and was made aware of holding morning dose of metoprolol.  Layth Cerezo RN

## 2019-06-10 NOTE — Progress Notes (Signed)
3 Days Post-Op  Subjective: CC: Foley in place for urinary retention.  Patient reports mild abdominal pain to the left of her incision.  She has had a BM.  She is tolerating a diet   Objective: Vital signs in last 24 hours: Temp:  [97.7 F (36.5 C)-98.3 F (36.8 C)] 98.1 F (36.7 C) (03/21 0814) Pulse Rate:  [44-70] 53 (03/21 0814) Resp:  [15-22] 17 (03/21 0814) BP: (131-147)/(54-66) 133/58 (03/21 0814) SpO2:  [96 %-97 %] 97 % (03/21 0814) Last BM Date: 06/09/19  Intake/Output from previous day: 03/20 0701 - 03/21 0700 In: 912.9 [P.O.:120; I.V.:792.9] Out: 425 [Urine:425] Intake/Output this shift: No intake/output data recorded.  PE: Gen: Alert, NAD, pleasant HEENT: EOM's intact, pupils equal and round Pulm: non-labored breathing Abd: Soft, mild distention, mild firmness to the left upper portion of the midline incision without skin changes. She is appropriately tender around midline incision as well as tenderness in lower abdomen. Midline wound c/d/i.  Ext: no BUE/BLE edema  Lab Results:  Recent Labs    06/08/19 0306 06/08/19 0306 06/09/19 0918 06/10/19 0245  WBC 9.6  --   --  7.0  HGB 12.5   < > 10.8* 9.5*  HCT 38.9   < > 34.1* 29.6*  PLT 200  --   --  149*   < > = values in this interval not displayed.   BMET Recent Labs    06/08/19 0306 06/09/19 0918  NA 136 137  K 3.7 4.1  CL 101 104  CO2 25 24  GLUCOSE 145* 99  BUN 24* 23  CREATININE 0.86 0.79  CALCIUM 8.3* 7.9*   PT/INR No results for input(s): LABPROT, INR in the last 72 hours. CMP     Component Value Date/Time   NA 137 06/09/2019 0918   NA 138 04/30/2019 1123   K 4.1 06/09/2019 0918   CL 104 06/09/2019 0918   CO2 24 06/09/2019 0918   GLUCOSE 99 06/09/2019 0918   BUN 23 06/09/2019 0918   BUN 23 04/30/2019 1123   CREATININE 0.79 06/09/2019 0918   CREATININE 1.28 (H) 08/02/2016 1420   CALCIUM 7.9 (L) 06/09/2019 0918   PROT 5.9 (L) 06/08/2019 0306   PROT 7.1 03/20/2018 1059   ALBUMIN 2.8 (L) 06/08/2019 0306   ALBUMIN 4.3 03/20/2018 1059   AST 19 06/08/2019 0306   ALT 10 06/08/2019 0306   ALKPHOS 71 06/08/2019 0306   BILITOT 0.9 06/08/2019 0306   BILITOT 0.5 03/20/2018 1059   GFRNONAA >60 06/09/2019 0918   GFRAA >60 06/09/2019 0918   Lipase  No results found for: LIPASE     Studies/Results: No results found.  Anti-infectives: Anti-infectives (From admission, onward)   Start     Dose/Rate Route Frequency Ordered Stop   06/07/19 2200  metroNIDAZOLE (FLAGYL) IVPB 500 mg     500 mg 100 mL/hr over 60 Minutes Intravenous Every 8 hours 06/07/19 1714 06/08/19 0001   06/07/19 1800  ciprofloxacin (CIPRO) IVPB 400 mg     400 mg 200 mL/hr over 60 Minutes Intravenous Every 12 hours 06/07/19 1714 06/08/19 0654   06/07/19 0600  ciprofloxacin (CIPRO) IVPB 400 mg  Status:  Discontinued     400 mg 200 mL/hr over 60 Minutes Intravenous Every 12 hours 06/07/19 0517 06/07/19 1714   06/07/19 0530  metroNIDAZOLE (FLAGYL) IVPB 500 mg  Status:  Discontinued     500 mg 100 mL/hr over 60 Minutes Intravenous Every 8 hours 06/07/19 0517 06/07/19 1714  Assessment/Plan HTN Atrial flutter on eliquis at home Recent right THA 01/2019 Code status DNR  Incarcerated incisional hernia -S/p open reduction and repair of obstructing incisional hernia with overlay mesh (mesh placed above the anterior fascia), lysis of adhesions - Dr. Kieth Brightly - 06/07/2019 - POD #3 - reg diet today   - Mobilize. PT - Pulm toilet, IS  ID -cipro/flagyl 3/18 - 3/19 FEN -IVF, FLD VTE -SCDs, subq heparin, await stabilization of hgb before restarting Eliquis. Hgb 9.5 from 12.5 this am. Am CBC  Foley - removed 3/18. Replaced 3/19 for urinary retention. Urecholine.  Will try removal again tom am   LOS: 3 days    Rosario Adie, MD  [facility]

## 2019-06-10 NOTE — Progress Notes (Signed)
PROGRESS NOTE  Glenda [name] [id number] DOB: 04-24-1924   PCP: Marin Olp, MD  Patient is from: SNF  DOA: 06/06/2019 LOS: 3  Brief Narrative / Interim history: 86 year old female with history of A. flutter on Eliquis, diastolic CHF, DDD, osteoarthritis, total right hip replacement, HTN, multiple abdominal surgeries and ventral hernia presenting with abdominal pain and nausea for about 2 days, and admitted for incarcerated ventral hernia as noted on CT abdomen and pelvis.  General surgery consulted.  She underwent open reduction and repair of obstructing incisional hernia with overlay mesh, and lysis of adhesion by Dr. Kieth Brightly on 06/07/2019.    Subjective: Seen and examined earlier this morning.  No major events overnight of this morning.  Heart rate in 40s overnight but not symptomatic.  Reports having a good night last night.  Denies chest pain, shortness of breath or GI symptoms.  She says she had a bowel movement yesterday.  Denies melena or hematochezia.  Objective: Vitals:   06/10/19 0258 06/10/19 0332 06/10/19 0527 06/10/19 0814  BP:  138/66  (!) 133/58  Pulse: (!) 44 (!) 55 (!) 48 (!) 53  Resp: 18 16 17 17   Temp:  98 F (36.7 C)  98.1 F (36.7 C)  TempSrc:  Oral  Oral  SpO2: 96% 97% 97% 97%  Weight:      Height:        Intake/Output Summary (Last 24 hours) at 06/10/2019 1353 Last data filed at 06/10/2019 1107 Gross per 24 hour  Intake 792.93 ml  Output 525 ml  Net 267.93 ml   Filed Weights   06/06/19 2115 06/07/19 0527 06/07/19 1323  Weight: 52.2 kg 49.1 kg 49.1 kg    Examination:  GENERAL: No acute distress.  Appears well.  HEENT: MMM.  Vision and hearing grossly intact.  NECK: Supple.  No apparent JVD.  RESP:  No IWOB. Good air movement bilaterally. CVS: HR in 63s.  Regular.Marland Kitchen Heart sounds normal.  ABD/GI/GU: Bowel sounds present. Soft. Non tender.  Dressing DCI.  Indwelling Foley. MSK/EXT:  Moves extremities. No apparent deformity. No edema.    SKIN: no apparent skin lesion or wound NEURO: Awake, alert and oriented appropriately.  No apparent focal neuro deficit. PSYCH: Calm. Normal affect.  Procedures:  3/18-open reduction and repair of obstructing incisional hernia with overlay mesh, and lysis of adhesion by Dr. Kieth Brightly  Assessment & Plan: Incarcerated infraumbilical hernia-tender and not reducible. -s/p open reduction and repair with overlay mesh and lysis of adhesion by Dr. Kieth Brightly on 3/18 -Eliquis on hold.  Will resume when okay from surgical standpoint -Changed oxycodone to home Norco per patient's request. -Cipro and Flagyl 3/18-3/19 -On regular diet. -OOB/PT/OT  A flutter: Bradycardic but not symptomatic. -Discontinue IV metoprolol -Eliquis on hold-hope to resume today if okay from surgical standpoint.  We will check FOBT as well  Chronic diastolic CHF: Stable.  Appears euvolemic.  No cardiopulmonary symptoms.  On Lasix 30 mg daily at home.  -Continue holding Lasix -Monitor fluid status  Delirium-resolved. -Frequent reorientation's and delirium precautions  Chronic pain/DJD/osteoarthritis-on Norco at home. -As needed morphine while n.p.o.  Essential HTN: Normotensive this morning. -As needed hydralazine  Acute urinary retention-likely due to opiates. -Continue bethanechol 10 mg 3 times daily -Voiding trial today  Normocytic anemia: Baseline Hgb 13-14> 14 (admit)> 12.5> 9.5. Likely a combination of hemodilution and blood loss from blood draws.  Anemia panel favors ACD.  -Check FOBT -Continue monitoring  History of breast cancer: Followed by Dr. Lindi Adie. -Resume  anastrozole when able  Anxiety/depression/insomnia: Stable -Resume home Zoloft -Home temazepam at reduced dose.  Debility: Patient is from SNF. -PT/OT               DVT prophylaxis: Subq heparin Code Status: DNR/DNI Family Communication: Updated patient's son, [name] over the phone on 3/20.  Discharge barrier: Ensure stable  H&H after resuming Eliquis.  Hemoglobin downtrending. Patient is from: SNF Final disposition: Likely back to SNF  Consultants: General surgery   Microbiology summarized: -Influenza PCR negative -COVID-19 negative  Sch Meds:  Scheduled Meds: . anastrozole  1 mg Oral Daily  . bethanechol  10 mg Oral TID  . Chlorhexidine Gluconate Cloth  6 each Topical Q0600  . heparin injection (subcutaneous)  5,000 Units Subcutaneous Q8H  . temazepam  7.5 mg Oral QHS   Continuous Infusions:  PRN Meds:.acetaminophen **OR** acetaminophen, hydrALAZINE, HYDROcodone-acetaminophen, morphine injection, ondansetron **OR** ondansetron (ZOFRAN) IV, promethazine, white petrolatum  Antimicrobials: Anti-infectives (From admission, onward)   Start     Dose/Rate Route Frequency Ordered Stop   06/07/19 2200  metroNIDAZOLE (FLAGYL) IVPB 500 mg     500 mg 100 mL/hr over 60 Minutes Intravenous Every 8 hours 06/07/19 1714 06/08/19 0001   06/07/19 1800  ciprofloxacin (CIPRO) IVPB 400 mg     400 mg 200 mL/hr over 60 Minutes Intravenous Every 12 hours 06/07/19 1714 06/08/19 0654   06/07/19 0600  ciprofloxacin (CIPRO) IVPB 400 mg  Status:  Discontinued     400 mg 200 mL/hr over 60 Minutes Intravenous Every 12 hours 06/07/19 0517 06/07/19 1714   06/07/19 0530  metroNIDAZOLE (FLAGYL) IVPB 500 mg  Status:  Discontinued     500 mg 100 mL/hr over 60 Minutes Intravenous Every 8 hours 06/07/19 0517 06/07/19 1714       I have personally reviewed the following labs and images: CBC: Recent Labs  Lab 06/06/19 2102 06/07/19 0625 06/08/19 0306 06/09/19 0918 06/10/19 0245  WBC 7.6 7.4 9.6  --  7.0  NEUTROABS 5.8 5.9  --   --   --   HGB 14.0 13.2 12.5 10.8* 9.5*  HCT 44.1 40.7 38.9 34.1* 29.6*  MCV 89.1 87.9 88.0  --  89.7  PLT 205 219 200  --  149*   BMP &GFR Recent Labs  Lab 06/06/19 2102 06/08/19 0306 06/09/19 0918  NA 138 136 137  K 3.6 3.7 4.1  CL 98 101 104  CO2 29 25 24   GLUCOSE 137* 145* 99  BUN  26* 24* 23  CREATININE 0.77 0.86 0.79  CALCIUM 9.4 8.3* 7.9*  MG  --  1.6* 2.1   Estimated Creatinine Clearance: 33.9 mL/min (by C-G formula based on SCr of 0.79 mg/dL). Liver & Pancreas: Recent Labs  Lab 06/06/19 2102 06/08/19 0306  AST 22 19  ALT 12 10  ALKPHOS 115 71  BILITOT 0.4 0.9  PROT 8.2* 5.9*  ALBUMIN 4.4 2.8*   No results for input(s): LIPASE, AMYLASE in the last 168 hours. No results for input(s): AMMONIA in the last 168 hours. Diabetic: No results for input(s): HGBA1C in the last 72 hours. No results for input(s): GLUCAP in the last 168 hours. Cardiac Enzymes: No results for input(s): CKTOTAL, CKMB, CKMBINDEX, TROPONINI in the last 168 hours. No results for input(s): PROBNP in the last 8760 hours. Coagulation Profile: Recent Labs  Lab 06/07/19 0625  INR 1.2   Thyroid Function Tests: No results for input(s): TSH, T4TOTAL, FREET4, T3FREE, THYROIDAB in the last 72 hours. Lipid Profile: No  results for input(s): CHOL, HDL, LDLCALC, TRIG, CHOLHDL, LDLDIRECT in the last 72 hours. Anemia Panel: Recent Labs    06/10/19 0802  VITAMINB12 447  FOLATE 19.1  FERRITIN 402*  TIBC 157*  IRON 35  RETICCTPCT 0.8   Urine analysis:    Component Value Date/Time   COLORURINE YELLOW 01/30/2019 [place] 01/30/2019 1443   LABSPEC 1.012 01/30/2019 1443   PHURINE 6.0 01/30/2019 [place] 01/30/2019 1443   GLUCOSEU NEGATIVE 01/16/2018 1300   HGBUR NEGATIVE 01/30/2019 1443   [place] 01/30/2019 1443   BILIRUBINUR Negative 12/19/2017 [place] 01/30/2019 1443   PROTEINUR NEGATIVE 01/30/2019 1443   UROBILINOGEN 0.2 01/16/2018 1300   NITRITE NEGATIVE 01/30/2019 1443   LEUKOCYTESUR NEGATIVE 01/30/2019 1443   Sepsis Labs: Invalid input(s): PROCALCITONIN, [place]  Microbiology: Recent Results (from the past 240 hour(s))  Respiratory Panel by RT PCR (Flu A&B, Covid) - Nasopharyngeal Swab     Status: None    Collection Time: 06/06/19  9:40 PM   Specimen: Nasopharyngeal Swab  Result Value Ref Range Status   SARS Coronavirus 2 by RT PCR NEGATIVE NEGATIVE Final    Comment: (NOTE) SARS-CoV-2 target nucleic acids are NOT DETECTED. The SARS-CoV-2 RNA is generally detectable in upper respiratoy specimens during the acute phase of infection. The lowest concentration of SARS-CoV-2 viral copies this assay can detect is 131 copies/mL. A negative result does not preclude SARS-Cov-2 infection and should not be used as the sole basis for treatment or other patient management decisions. A negative result may occur with  improper specimen collection/handling, submission of specimen other than nasopharyngeal swab, presence of viral mutation(s) within the areas targeted by this assay, and inadequate number of viral copies (<131 copies/mL). A negative result must be combined with clinical observations, patient history, and epidemiological information. The expected result is Negative. Fact Sheet for Patients:  PinkCheek.be Fact Sheet for Healthcare Providers:  GravelBags.it This test is not yet ap proved or cleared by the [place] FDA and  has been authorized for detection and/or diagnosis of SARS-CoV-2 by FDA under an Emergency Use Authorization (EUA). This EUA will remain  in effect (meaning this test can be used) for the duration of the COVID-19 declaration under Section 564(b)(1) of the Act, 21 U.S.C. section 360bbb-3(b)(1), unless the authorization is terminated or revoked sooner.    Influenza A by PCR NEGATIVE NEGATIVE Final   Influenza B by PCR NEGATIVE NEGATIVE Final    Comment: (NOTE) The Xpert Xpress SARS-CoV-2/FLU/RSV assay is intended as an aid in  the diagnosis of influenza from Nasopharyngeal swab specimens and  should not be used as a sole basis for treatment. Nasal washings and  aspirates are unacceptable for Xpert Xpress  SARS-CoV-2/FLU/RSV  testing. Fact Sheet for Patients: PinkCheek.be Fact Sheet for Healthcare Providers: GravelBags.it This test is not yet approved or cleared by the [place] FDA and  has been authorized for detection and/or diagnosis of SARS-CoV-2 by  FDA under an Emergency Use Authorization (EUA). This EUA will remain  in effect (meaning this test can be used) for the duration of the  Covid-19 declaration under Section 564(b)(1) of the Act, 21  U.S.C. section 360bbb-3(b)(1), unless the authorization is  terminated or revoked. Performed at [facility], [place] [address], [place], [place] [address]   Surgical pcr screen     Status: None   Collection Time: 06/07/19 12:45 PM   Specimen: Nasal Mucosa; Nasal Swab  Result  Value Ref Range Status   MRSA, PCR NEGATIVE NEGATIVE Final   Staphylococcus aureus NEGATIVE NEGATIVE Final    Comment: (NOTE) The Xpert SA Assay (FDA approved for NASAL specimens in patients 65 years of age and older), is one component of a comprehensive surveillance program. It is not intended to diagnose infection nor to guide or monitor treatment. Performed at [facility], [place] [address] [place], [place] [address]     Radiology Studies: No results found.    Elenor Wildes T. Shelton  If 7PM-7AM, please contact night-coverage [email or web address] Password [facility] 06/10/2019, 1:53 PM

## 2019-06-10 NOTE — Progress Notes (Signed)
Pt had not voided since foley removal at 1130. Pt said that she did not have the urge to void. Pt sat on BSC and was not successful. Bladder scan shows only 3cc. Will continue to monitor.  Clyde Canterbury, RN

## 2019-06-10 NOTE — Progress Notes (Signed)
Patient has been sustaining non symptomatic bradycardia through out the evening after received 2200 metoprolol dose. Patient has slept through out the night, which from previous nurse this is the first time since she has been her. When nurse wakes up patient to assess patients heart rate goes back up into the 50's. Will hold morning dose of metoprolol.  Will continue to monitor [facility] RN

## 2019-06-11 ENCOUNTER — Ambulatory Visit: Payer: Medicare Other | Admitting: Family Medicine

## 2019-06-11 LAB — RENAL FUNCTION PANEL
Albumin: 2.3 g/dL — ABNORMAL LOW (ref 3.5–5.0)
Anion gap: 9 (ref 5–15)
BUN: 27 mg/dL — ABNORMAL HIGH (ref 8–23)
CO2: 24 mmol/L (ref 22–32)
Calcium: 7.6 mg/dL — ABNORMAL LOW (ref 8.9–10.3)
Chloride: 104 mmol/L (ref 98–111)
Creatinine, Ser: 0.74 mg/dL (ref 0.44–1.00)
GFR calc Af Amer: >60 mL/min (ref >60)
GFR calc non Af Amer: >60 mL/min (ref >60)
Glucose, Bld: 106 mg/dL — ABNORMAL HIGH (ref 70–99)
Phosphorus: 3.3 mg/dL (ref 2.5–4.6)
Potassium: 3.9 mmol/L (ref 3.5–5.1)
Sodium: 137 mmol/L (ref 135–145)

## 2019-06-11 LAB — CBC
HCT: 28.8 % — ABNORMAL LOW (ref 36.0–46.0)
Hemoglobin: 9.1 g/dL — ABNORMAL LOW (ref 12.0–15.0)
MCH: 28.4 pg (ref 26.0–34.0)
MCHC: 31.6 g/dL (ref 30.0–36.0)
MCV: 90 fL (ref 80.0–100.0)
Platelets: 157 10*3/uL (ref 150–400)
RBC: 3.2 MIL/uL — ABNORMAL LOW (ref 3.87–5.11)
RDW: 14.5 % (ref 11.5–15.5)
WBC: 6.3 10*3/uL (ref 4.0–10.5)
nRBC: 0 % (ref 0.0–0.2)

## 2019-06-11 LAB — MAGNESIUM: Magnesium: 2 mg/dL (ref 1.7–2.4)

## 2019-06-11 MED ORDER — DOCUSATE SODIUM 100 MG PO CAPS
100.0000 mg | ORAL_CAPSULE | Freq: Two times a day (BID) | ORAL | Status: DC
  Administered 2019-06-11 – 2019-06-12 (×4): 100 mg via ORAL
  Filled 2019-06-11 (×4): qty 1

## 2019-06-11 MED ORDER — BETHANECHOL CHLORIDE 25 MG PO TABS
25.0000 mg | ORAL_TABLET | Freq: Three times a day (TID) | ORAL | Status: DC
  Administered 2019-06-11 (×3): 25 mg via ORAL
  Filled 2019-06-11 (×3): qty 1

## 2019-06-11 MED ORDER — BISACODYL 10 MG RE SUPP: 10.0000 mg | Freq: Every day | RECTAL | Status: DC | PRN

## 2019-06-11 MED ORDER — POLYETHYLENE GLYCOL 3350 17 G PO PACK
17.0000 g | PACK | Freq: Every day | ORAL | Status: DC
  Filled 2019-06-11: qty 1

## 2019-06-11 MED ORDER — POLYETHYLENE GLYCOL 3350 17 G PO PACK
17.0000 g | PACK | Freq: Two times a day (BID) | ORAL | Status: DC | PRN
  Administered 2019-06-11 – 2019-06-13 (×3): 17 g via ORAL
  Filled 2019-06-11 (×2): qty 1

## 2019-06-11 NOTE — TOC Initial Note (Signed)
Transition of Care [facility]) - Initial/Assessment Note    Patient Details  Name: Glenda [name] MRN: [id number] Date of Birth: June 18, 1924  Transition of Care [facility]) CM/SW Contact:    Trula Ore, [place] Phone Number: 06/11/2019, 3:38 PM  Clinical Narrative:                  CSW spoke with patients son [name] is was in agreement to patient going back to [place]. CSW called [facility] and informed them of patients current care needs.  They informed CSW they can provide Outpatient Physical Therapy for patient.   No other discharge needs identified at this time.   Expected Discharge Plan: Assisted Living(With Home Health) Barriers to Discharge: Continued Medical Work up   Patient Goals and CMS Choice   CMS Medicare.gov Compare Post Acute Care list provided to:: Patient Represenative (must comment)(Son [name]) Choice offered to / list presented to : Adult Children(Son [name])  Expected Discharge Plan and Services Expected Discharge Plan: Assisted Living(With Home Health)     Post Acute Care Choice: (Return to [facility]) Living arrangements for the past 2 months: [place] Agency: ([facility])        Prior Living Arrangements/Services Living arrangements for the past 2 months: [place] Lives with:: Self, Facility Resident Patient language and need for interpreter reviewed:: Yes Do you feel safe going back to the place where you live?: Yes      Need for Family Participation in Patient Care: Yes (Comment) Care giver support system in place?: Yes (comment)   Criminal Activity/Legal Involvement Pertinent to Current Situation/Hospitalization: No - Comment as needed  Activities of Daily Living Home Assistive Devices/Equipment: Eyeglasses, Hearing aid, Walker (specify type) ADL Screening (condition at time of admission) Patient's cognitive ability adequate to safely complete daily activities?: Yes Is  the patient deaf or have difficulty hearing?: Yes Does the patient have difficulty seeing, even when wearing glasses/contacts?: No Does the patient have difficulty concentrating, remembering, or making decisions?: No Patient able to express need for assistance with ADLs?: Yes Does the patient have difficulty dressing or bathing?: No Independently performs ADLs?: Yes (appropriate for developmental age) Does the patient have difficulty walking or climbing stairs?: No Weakness of Legs: None Weakness of Arms/Hands: None  Permission Sought/Granted Permission sought to share information with : Case Manager, Family Supports, Chartered certified accountant granted to share information with : Yes, Verbal Permission Granted  Share Information with NAME: [name]  Permission granted to share info w AGENCY: [place] granted to share info w Relationship: Son  Permission granted to share info w Contact Information: [name] [phone]  Emotional Assessment       Orientation: : Oriented to Self, Oriented to Place, Oriented to  Time, Oriented to Situation Alcohol / Substance Use: Not Applicable Psych Involvement: No (comment)  Admission diagnosis:  Incarcerated ventral hernia [K43.6] Incarcerated hernia of abdominal cavity [K45.0] Patient Active Problem List   Diagnosis Date Noted  . Incarcerated ventral hernia 06/06/2019  . DNR (do not resuscitate) 06/06/2019  . Bradycardia 02/20/2019  . Status post total replacement of right hip 02/05/2019  . Osteoarthritis of right hip 02/01/2019  . Hyponatremia 10/19/2018  . Femur fracture, right ([place]) 10/18/2018  . Hip fracture ([place]) 10/18/2018  . Anxiety 08/08/2018  . Abdominal hernia  05/19/2018  . Chronic pain syndrome 02/09/2018  . Chronic narcotic use 08/11/2017  . Malignant neoplasm of upper-outer quadrant of left breast in female, estrogen receptor positive ([place]) 06/27/2017  . History of cardioversion 08/20/2016  .  Atrial flutter with rapid ventricular response ([place]) 08/19/2016  . Chronic anticoagulation-Eliquis 02/04/2016  . Atrial flutter ([place])   . Shortness of breath 12/13/2014  . Palpitations 12/13/2014  . Constipation 11/06/2012  . Accelerated/malignant hypertension 09/14/2012  . Chronic diastolic HF (heart failure) ([place]) 07/11/2012  . Osteoarthritis 04/03/2012  . Anemia 06/10/2011  . Essential hypertension 03/25/2011  . Insomnia 09/15/2010   PCP:  Marin Olp, MD Pharmacy:   [facility], [place] [facility] [place] [address] [place] [address] Phone: [phone] Fax: [phone]     Social Determinants of Health (SDOH) Interventions    Readmission Risk Interventions Readmission Risk Prevention Plan 02/06/2019  Transportation Screening Complete  PCP or Specialist Appt within 3-5 Days Not Complete  Not Complete comments DC date unknown but established with providers  [place] or [place] Complete  Social Work Consult for [place] Planning/Counseling Complete  Palliative Care Screening Not Complete  Palliative Care Screening Not Complete Comments no desire  Medication Review (RN Transport planner) Referral to Pharmacy  Some recent data might be hidden

## 2019-06-11 NOTE — Progress Notes (Signed)
PROGRESS NOTE  Glenda [name] [id number] DOB: [birth year]   PCP: Marin Olp, MD  Patient is from: SNF  DOA: 06/06/2019 LOS: 4  Brief Narrative / Interim history: 86 year old female with history of A. flutter on Eliquis, diastolic CHF, DDD, osteoarthritis, total right hip replacement, HTN, multiple abdominal surgeries and ventral hernia presenting with abdominal pain and nausea for about 2 days, and admitted for incarcerated ventral hernia as noted on CT abdomen and pelvis.  General surgery consulted.  She underwent open reduction and repair of obstructing incisional hernia with overlay mesh, and lysis of adhesion by Dr. Kieth Brightly on 06/07/2019.   Patient had postop urinary retention and drop in hemoglobin.   Subjective: Seen and examined earlier this morning.  No major events overnight or this morning.  Reports difficulty going to bathroom-BM and urination.  Denies chest pain, dyspnea, nausea, vomiting or abdominal pain.  Passing gases.  Objective: Vitals:   06/10/19 0814 06/10/19 1648 06/10/19 2019 06/10/19 2333  BP: (!) 133/58 (!) 142/55 (!) 130/54   Pulse: (!) 53 (!) 52 65   Resp: 17 16 17 17   Temp: 98.1 F (36.7 C) 98.1 F (36.7 C) 98.2 F (36.8 C)   TempSrc: Oral Oral Oral   SpO2: 97% 99%    Weight:      Height:        Intake/Output Summary (Last 24 hours) at 06/11/2019 1244 Last data filed at 06/11/2019 0925 Gross per 24 hour  Intake 240 ml  Output 400 ml  Net -160 ml   Filed Weights   06/06/19 2115 06/07/19 0527 06/07/19 1323  Weight: 52.2 kg 49.1 kg 49.1 kg    Examination:  GENERAL: Sitting on bedside chair trying to eat breakfast. HEENT: MMM.  Vision and hearing grossly intact.  NECK: Supple.  No apparent JVD.  RESP:  No IWOB. Good air movement bilaterally. CVS:  RRR. Heart sounds normal.  ABD/GI/GU: Bowel sounds present. Soft.  Mild tenderness to palpation.  Surgical dressing DCI. MSK/EXT:  Moves extremities. No apparent deformity. No edema.    SKIN: Surgical dressing DCI. NEURO: Awake, alert and oriented appropriately.  No apparent focal neuro deficit. PSYCH: Calm. Normal affect.   Procedures:  3/18-open reduction and repair of obstructing incisional hernia with overlay mesh, and lysis of adhesion by Dr. Kieth Brightly  Assessment & Plan: Incarcerated infraumbilical hernia-tender and not reducible. -s/p open reduction and repair with overlay mesh and lysis of adhesion by Dr. Kieth Brightly on 3/18 -Eliquis on hold.  Will resume when Hgb stabilized. -Changed oxycodone to home Norco per patient's request. -Cipro and Flagyl 3/18-3/19 -On regular diet. -Bowel regimen -OOB/PT/OT  A flutter: Bradycardic but not symptomatic. -Discontinue IV metoprolol -We will resume home Eliquis if FOBT negative and Hgb stabilizes.  Chronic diastolic CHF: Stable.  Appears euvolemic.  On Lasix 30 mg daily at home.  -Resume home Lasix at 20 mg daily -Monitor fluid status  Delirium-resolved. -Frequent reorientation's and delirium precautions  Chronic pain/DJD/osteoarthritis-on Norco at home. -Resume home Norco.  Essential HTN: Normotensive this morning. -As needed hydralazine  Acute urinary retention-likely due to opiates.  Indwelling Foley 3/18-3/19 and 3/19-3/21 -Increase bethanechol to 25 mg 3 times daily -Continue intermittent bladder scan  Normocytic anemia: Baseline Hgb 13-14> 14 (admit)> 12.5> 9.5> 9.1. Likely a combination of hemodilution and blood loss from blood draws.  She could be dehydrated due to abdominal pain and nausea on arrival.  Anemia panel favors ACD.  -Check FOBT -Continue monitoring -Eliquis on hold.  History of breast  cancer: Followed by Dr. Lindi Adie. -Continue home anastrozole  Anxiety/depression/insomnia: Stable -Continue home Zoloft -Home temazepam at reduced dose.  Debility: Patient is from SNF. -PT/OT               DVT prophylaxis: Subq heparin Code Status: DNR/DNI Family Communication: Updated  patient's son, [name] over the phone on 3/20.  Discharge barrier: Ensure stable H&H after resuming Eliquis.  Hemoglobin downtrending. Patient is from: SNF Final disposition: Likely back to SNF  Consultants: General surgery   Microbiology summarized: -Influenza PCR negative -COVID-19 negative  Sch Meds:  Scheduled Meds: . anastrozole  1 mg Oral Daily  . bethanechol  25 mg Oral TID  . Chlorhexidine Gluconate Cloth  6 each Topical Q0600  . docusate sodium  100 mg Oral BID  . furosemide  20 mg Oral Daily  . heparin injection (subcutaneous)  5,000 Units Subcutaneous Q8H  . sertraline  25 mg Oral QHS  . temazepam  7.5 mg Oral QHS   Continuous Infusions:  PRN Meds:.acetaminophen **OR** acetaminophen, bisacodyl, hydrALAZINE, HYDROcodone-acetaminophen, ondansetron **OR** ondansetron (ZOFRAN) IV, polyethylene glycol, white petrolatum  Antimicrobials: Anti-infectives (From admission, onward)   Start     Dose/Rate Route Frequency Ordered Stop   06/07/19 2200  metroNIDAZOLE (FLAGYL) IVPB 500 mg     500 mg 100 mL/hr over 60 Minutes Intravenous Every 8 hours 06/07/19 1714 06/08/19 0001   06/07/19 1800  ciprofloxacin (CIPRO) IVPB 400 mg     400 mg 200 mL/hr over 60 Minutes Intravenous Every 12 hours 06/07/19 1714 06/08/19 0654   06/07/19 0600  ciprofloxacin (CIPRO) IVPB 400 mg  Status:  Discontinued     400 mg 200 mL/hr over 60 Minutes Intravenous Every 12 hours 06/07/19 0517 06/07/19 1714   06/07/19 0530  metroNIDAZOLE (FLAGYL) IVPB 500 mg  Status:  Discontinued     500 mg 100 mL/hr over 60 Minutes Intravenous Every 8 hours 06/07/19 0517 06/07/19 1714       I have personally reviewed the following labs and images: CBC: Recent Labs  Lab 06/06/19 2102 06/06/19 2102 06/07/19 0625 06/08/19 0306 06/09/19 0918 06/10/19 0245 06/11/19 0341  WBC 7.6  --  7.4 9.6  --  7.0 6.3  NEUTROABS 5.8  --  5.9  --   --   --   --   HGB 14.0   < > 13.2 12.5 10.8* 9.5* 9.1*  HCT 44.1   < > 40.7  38.9 34.1* 29.6* 28.8*  MCV 89.1  --  87.9 88.0  --  89.7 90.0  PLT 205  --  219 200  --  149* 157   < > = values in this interval not displayed.   BMP &GFR Recent Labs  Lab 06/06/19 2102 06/08/19 0306 06/09/19 0918 06/11/19 0341  NA 138 136 137 137  K 3.6 3.7 4.1 3.9  CL 98 101 104 104  CO2 29 25 24 24   GLUCOSE 137* 145* 99 106*  BUN 26* 24* 23 27*  CREATININE 0.77 0.86 0.79 0.74  CALCIUM 9.4 8.3* 7.9* 7.6*  MG  --  1.6* 2.1 2.0  PHOS  --   --   --  3.3   Estimated Creatinine Clearance: 33.9 mL/min (by C-G formula based on SCr of 0.74 mg/dL). Liver & Pancreas: Recent Labs  Lab 06/06/19 2102 06/08/19 0306 06/11/19 0341  AST 22 19  --   ALT 12 10  --   ALKPHOS 115 71  --   BILITOT 0.4 0.9  --  PROT 8.2* 5.9*  --   ALBUMIN 4.4 2.8* 2.3*   No results for input(s): LIPASE, AMYLASE in the last 168 hours. No results for input(s): AMMONIA in the last 168 hours. Diabetic: No results for input(s): HGBA1C in the last 72 hours. No results for input(s): GLUCAP in the last 168 hours. Cardiac Enzymes: No results for input(s): CKTOTAL, CKMB, CKMBINDEX, TROPONINI in the last 168 hours. No results for input(s): PROBNP in the last 8760 hours. Coagulation Profile: Recent Labs  Lab 06/07/19 0625  INR 1.2   Thyroid Function Tests: No results for input(s): TSH, T4TOTAL, FREET4, T3FREE, THYROIDAB in the last 72 hours. Lipid Profile: No results for input(s): CHOL, HDL, LDLCALC, TRIG, CHOLHDL, LDLDIRECT in the last 72 hours. Anemia Panel: Recent Labs    06/10/19 0802  VITAMINB12 447  FOLATE 19.1  FERRITIN 402*  TIBC 157*  IRON 35  RETICCTPCT 0.8   Urine analysis:    Component Value Date/Time   COLORURINE YELLOW 01/30/2019 [place] 01/30/2019 1443   LABSPEC 1.012 01/30/2019 1443   PHURINE 6.0 01/30/2019 [place] 01/30/2019 1443   GLUCOSEU NEGATIVE 01/16/2018 1300   HGBUR NEGATIVE 01/30/2019 1443   [place] 01/30/2019 1443    BILIRUBINUR Negative 12/19/2017 [place] 01/30/2019 1443   PROTEINUR NEGATIVE 01/30/2019 1443   UROBILINOGEN 0.2 01/16/2018 1300   NITRITE NEGATIVE 01/30/2019 1443   LEUKOCYTESUR NEGATIVE 01/30/2019 1443   Sepsis Labs: Invalid input(s): PROCALCITONIN, [place]  Microbiology: Recent Results (from the past 240 hour(s))  Respiratory Panel by RT PCR (Flu A&B, Covid) - Nasopharyngeal Swab     Status: None   Collection Time: 06/06/19  9:40 PM   Specimen: Nasopharyngeal Swab  Result Value Ref Range Status   SARS Coronavirus 2 by RT PCR NEGATIVE NEGATIVE Final    Comment: (NOTE) SARS-CoV-2 target nucleic acids are NOT DETECTED. The SARS-CoV-2 RNA is generally detectable in upper respiratoy specimens during the acute phase of infection. The lowest concentration of SARS-CoV-2 viral copies this assay can detect is 131 copies/mL. A negative result does not preclude SARS-Cov-2 infection and should not be used as the sole basis for treatment or other patient management decisions. A negative result may occur with  improper specimen collection/handling, submission of specimen other than nasopharyngeal swab, presence of viral mutation(s) within the areas targeted by this assay, and inadequate number of viral copies (<131 copies/mL). A negative result must be combined with clinical observations, patient history, and epidemiological information. The expected result is Negative. Fact Sheet for Patients:  PinkCheek.be Fact Sheet for Healthcare Providers:  GravelBags.it This test is not yet ap proved or cleared by the [place] FDA and  has been authorized for detection and/or diagnosis of SARS-CoV-2 by FDA under an Emergency Use Authorization (EUA). This EUA will remain  in effect (meaning this test can be used) for the duration of the COVID-19 declaration under Section 564(b)(1) of the Act, 21 U.S.C. section  360bbb-3(b)(1), unless the authorization is terminated or revoked sooner.    Influenza A by PCR NEGATIVE NEGATIVE Final   Influenza B by PCR NEGATIVE NEGATIVE Final    Comment: (NOTE) The Xpert Xpress SARS-CoV-2/FLU/RSV assay is intended as an aid in  the diagnosis of influenza from Nasopharyngeal swab specimens and  should not be used as a sole basis for treatment. Nasal washings and  aspirates are unacceptable for Xpert Xpress SARS-CoV-2/FLU/RSV  testing. Fact Sheet for Patients: PinkCheek.be Fact Sheet for Healthcare Providers: GravelBags.it  This test is not yet approved or cleared by the [place] and  has been authorized for detection and/or diagnosis of SARS-CoV-2 by  FDA under an Emergency Use Authorization (EUA). This EUA will remain  in effect (meaning this test can be used) for the duration of the  Covid-19 declaration under Section 564(b)(1) of the Act, 21  U.S.C. section 360bbb-3(b)(1), unless the authorization is  terminated or revoked. Performed at [facility], [place] [address] [place], [place] [address]   Surgical pcr screen     Status: None   Collection Time: 06/07/19 12:45 PM   Specimen: Nasal Mucosa; Nasal Swab  Result Value Ref Range Status   MRSA, PCR NEGATIVE NEGATIVE Final   Staphylococcus aureus NEGATIVE NEGATIVE Final    Comment: (NOTE) The Xpert SA Assay (FDA approved for NASAL specimens in patients 55 years of age and older), is one component of a comprehensive surveillance program. It is not intended to diagnose infection nor to guide or monitor treatment. Performed at [facility], [place] [address], [place], [place] [address]     Radiology Studies: No results found.    Efren Kross T. Holladay  If 7PM-7AM, please contact night-coverage [email or web address] Password [email or web address] 06/11/2019, 12:44 PM

## 2019-06-11 NOTE — Progress Notes (Signed)
4 Days Post-Op   Subjective/Chief Complaint: Had small bm, small amt flatus, no more nausea, oob   Objective: Vital signs in last 24 hours: Temp:  [98.1 F (36.7 C)-98.2 F (36.8 C)] 98.2 F (36.8 C) (03/21 2019) Pulse Rate:  [52-65] 65 (03/21 2019) Resp:  [16-17] 17 (03/21 2333) BP: (130-142)/(54-55) 130/54 (03/21 2019) SpO2:  [99 %] 99 % (03/21 1648) Last BM Date: 06/10/19  Intake/Output from previous day: 03/21 0701 - 03/22 0700 In: 240 [P.O.:240] Out: 400 [Urine:400] Intake/Output this shift: Total I/O In: -  Out: 100 [Urine:100]  abd mild distended hematoma at surgical site, approp tender, no wound infection, some bs  Lab Results:  Recent Labs    06/10/19 0245 06/11/19 0341  WBC 7.0 6.3  HGB 9.5* 9.1*  HCT 29.6* 28.8*  PLT 149* 157   BMET Recent Labs    06/09/19 0918 06/11/19 0341  NA 137 137  K 4.1 3.9  CL 104 104  CO2 24 24  GLUCOSE 99 106*  BUN 23 27*  CREATININE 0.79 0.74  CALCIUM 7.9* 7.6*   PT/INR No results for input(s): LABPROT, INR in the last 72 hours. ABG No results for input(s): PHART, HCO3 in the last 72 hours.  Invalid input(s): PCO2, PO2  Studies/Results: No results found.  Anti-infectives: Anti-infectives (From admission, onward)   Start     Dose/Rate Route Frequency Ordered Stop   06/07/19 2200  metroNIDAZOLE (FLAGYL) IVPB 500 mg     500 mg 100 mL/hr over 60 Minutes Intravenous Every 8 hours 06/07/19 1714 06/08/19 0001   06/07/19 1800  ciprofloxacin (CIPRO) IVPB 400 mg     400 mg 200 mL/hr over 60 Minutes Intravenous Every 12 hours 06/07/19 1714 06/08/19 0654   06/07/19 0600  ciprofloxacin (CIPRO) IVPB 400 mg  Status:  Discontinued     400 mg 200 mL/hr over 60 Minutes Intravenous Every 12 hours 06/07/19 0517 06/07/19 1714   06/07/19 0530  metroNIDAZOLE (FLAGYL) IVPB 500 mg  Status:  Discontinued     500 mg 100 mL/hr over 60 Minutes Intravenous Every 8 hours 06/07/19 0517 06/07/19 1714       Assessment/Plan:   POD 4 mesh repair of Incarcerated incisional hernia-LK - reg diet, bowel regimen   - Mobilize. PT - Pulm toilet, IS ID -cipro/flagyl 3/18- 3/19 FEN -reg diet VTE -SCDs,subq heparin, await stabilization of hgb before restarting Eliquis. Hgb 9.5 from 12.5 this am. Can start eliquis tomorrow if hb same, do not think actively bleeding  Foley -removed 3/18. Replaced 3/19 for urinary retention. Urecholine.  remove foley again dispo- likely snf soon Rolm Bookbinder 06/11/2019

## 2019-06-12 LAB — HEMOGLOBIN AND HEMATOCRIT, BLOOD
HCT: 28.4 % — ABNORMAL LOW (ref 36.0–46.0)
Hemoglobin: 9.3 g/dL — ABNORMAL LOW (ref 12.0–15.0)

## 2019-06-12 LAB — OCCULT BLOOD X 1 CARD TO LAB, STOOL: Fecal Occult Bld: NEGATIVE

## 2019-06-12 MED ORDER — APIXABAN 2.5 MG PO TABS
2.5000 mg | ORAL_TABLET | Freq: Two times a day (BID) | ORAL | Status: DC
  Administered 2019-06-12 – 2019-06-13 (×3): 2.5 mg via ORAL
  Filled 2019-06-12 (×3): qty 1

## 2019-06-12 MED ORDER — BETHANECHOL CHLORIDE 10 MG PO TABS
10.0000 mg | ORAL_TABLET | Freq: Three times a day (TID) | ORAL | Status: DC
  Administered 2019-06-12 – 2019-06-13 (×4): 10 mg via ORAL
  Filled 2019-06-12 (×6): qty 1

## 2019-06-12 NOTE — Progress Notes (Signed)
Physical Therapy Treatment Patient Details Name: Glenda [name] MRN: [id number] DOB: 03/26/1924 Today's Date: 06/12/2019    History of Present Illness Patient is 86 year old female admitted with abdominal pain. S/P repair of incarcerated hernia. PMH includes: recent R THA (november), HTN, CHF, anxiety.    PT Comments    Patient seen for mobility progression. Pt tolerated gait distance of 60 ft with RW and min guard/min A (grossly min guard). VSS on RA.  Pt will continue to benefit from further skilled PT services to maximize independence and safety with mobility.    Follow Up Recommendations  Home health PT;Supervision for mobility/OOB;Supervision - Intermittent     Equipment Recommendations  None recommended by PT    Recommendations for Other Services       Precautions / Restrictions Precautions Precautions: Fall Restrictions Weight Bearing Restrictions: No    Mobility  Bed Mobility Overal bed mobility: Needs Assistance Bed Mobility: Rolling;Sidelying to Sit Rolling: Min guard Sidelying to sit: Min assist       General bed mobility comments: use for bed rails; cues for sequencing   Transfers Overall transfer level: Needs assistance Equipment used: Rolling walker (2 wheeled) Transfers: Sit to/from Stand Sit to Stand: Min assist         General transfer comment: cues for safe hand placement; assist to power up into standing   Ambulation/Gait Ambulation/Gait assistance: Min assist;Min guard Gait Distance (Feet): 60 Feet Assistive device: Rolling walker (2 wheeled) Gait Pattern/deviations: Decreased stride length;Decreased step length - right;Decreased step length - left;Step-through pattern;Trunk flexed Gait velocity: decreased   General Gait Details: cues for maintaining safe proximity to RW and upright posture; assist to steady especially when turning     Stairs             Wheelchair Mobility    Modified Rankin (Stroke Patients Only)       Balance Overall balance assessment: Needs assistance Sitting-balance support: Feet supported Sitting balance-Leahy Scale: Good     Standing balance support: During functional activity;Bilateral upper extremity supported Standing balance-Leahy Scale: Fair                              Cognition Arousal/Alertness: Awake/alert Behavior During Therapy: WFL for tasks assessed/performed Overall Cognitive Status: No family/caregiver present to determine baseline cognitive functioning Area of Impairment: Problem solving;Safety/judgement;Memory                     Memory: Decreased short-term memory   Safety/Judgement: Decreased awareness of safety;Decreased awareness of deficits   Problem Solving: Difficulty sequencing;Requires verbal cues        Exercises      General Comments        Pertinent Vitals/Pain Pain Assessment: Faces Faces Pain Scale: Hurts a little bit Pain Location: abdomen Pain Descriptors / Indicators: Discomfort;Guarding Pain Intervention(s): Limited activity within patient's tolerance;Monitored during session;Repositioned    Home Living                      Prior Function            PT Goals (current goals can now be found in the care plan section) Progress towards PT goals: Progressing toward goals    Frequency    Min 3X/week      PT Plan Current plan remains appropriate    Co-evaluation              AM-PAC  PT "6 Clicks" Mobility   Outcome Measure  Help needed turning from your back to your side while in a flat bed without using bedrails?: A Little Help needed moving from lying on your back to sitting on the side of a flat bed without using bedrails?: A Little Help needed moving to and from a bed to a chair (including a wheelchair)?: A Little Help needed standing up from a chair using your arms (e.g., wheelchair or bedside chair)?: A Little Help needed to walk in hospital room?: A Little Help needed  climbing 3-5 steps with a railing? : A Lot 6 Click Score: 17    End of Session Equipment Utilized During Treatment: Gait belt Activity Tolerance: Patient tolerated treatment well Patient left: in chair;with call bell/phone within reach;with chair alarm set Nurse Communication: Mobility status PT Visit Diagnosis: Other abnormalities of gait and mobility (R26.89);Muscle weakness (generalized) (M62.81);Difficulty in walking, not elsewhere classified (R26.2);History of falling (Z91.81);Pain Pain - part of body: (abdomen)     Time: [id number] PT Time Calculation (min) (ACUTE ONLY): 18 min  Charges:  $Gait Training: 8-22 mins                     Earney Navy, PTA Acute Rehabilitation Services Pager: [phone] Office: [phone]     Darliss Cheney 06/12/2019, 4:48 PM

## 2019-06-12 NOTE — Progress Notes (Signed)
5 Days Post-Op  Subjective: CC: Doing well. Tolerating diet without n/v, increased abdominal pain or distension. She is passing flatus. BM recorded from overnight.   Objective: Vital signs in last 24 hours: Temp:  [97.8 F (36.6 C)-98.2 F (36.8 C)] 98.1 F (36.7 C) (03/23 0840) Pulse Rate:  [60-75] 75 (03/23 0840) Resp:  [15-21] 21 (03/23 0840) BP: (141-171)/(58-72) 155/72 (03/23 0840) SpO2:  [90 %-99 %] 90 % (03/23 0840) Last BM Date: 06/11/19  Intake/Output from previous day: 03/22 0701 - 03/23 0700 In: -  Out: 850 [Urine:850] Intake/Output this shift: No intake/output data recorded.  PE: Gen: Alert, NAD, pleasant Pulm: rate and effort normal Abd: Soft,ND,hematoma noted at surgical site. No signs of skin infection. She is appropriately tender around midline incision. Midline wound c/d/i.+BS Ext: no BUE/BLE edema, calves soft and nontender  Lab Results:  Recent Labs    06/10/19 0245 06/10/19 0245 06/11/19 0341 06/12/19 0254  WBC 7.0  --  6.3  --   HGB 9.5*   < > 9.1* 9.3*  HCT 29.6*   < > 28.8* 28.4*  PLT 149*  --  157  --    < > = values in this interval not displayed.   BMET Recent Labs    06/11/19 0341  NA 137  K 3.9  CL 104  CO2 24  GLUCOSE 106*  BUN 27*  CREATININE 0.74  CALCIUM 7.6*   PT/INR No results for input(s): LABPROT, INR in the last 72 hours. CMP     Component Value Date/Time   NA 137 06/11/2019 0341   NA 138 04/30/2019 1123   K 3.9 06/11/2019 0341   CL 104 06/11/2019 0341   CO2 24 06/11/2019 0341   GLUCOSE 106 (H) 06/11/2019 0341   BUN 27 (H) 06/11/2019 0341   BUN 23 04/30/2019 1123   CREATININE 0.74 06/11/2019 0341   CREATININE 1.28 (H) 08/02/2016 1420   CALCIUM 7.6 (L) 06/11/2019 0341   PROT 5.9 (L) 06/08/2019 0306   PROT 7.1 03/20/2018 1059   ALBUMIN 2.3 (L) 06/11/2019 0341   ALBUMIN 4.3 03/20/2018 1059   AST 19 06/08/2019 0306   ALT 10 06/08/2019 0306   ALKPHOS 71 06/08/2019 0306   BILITOT 0.9 06/08/2019  0306   BILITOT 0.5 03/20/2018 1059   GFRNONAA >60 06/11/2019 0341   GFRAA >60 06/11/2019 0341   Lipase  No results found for: LIPASE     Studies/Results: No results found.  Anti-infectives: Anti-infectives (From admission, onward)   Start     Dose/Rate Route Frequency Ordered Stop   06/07/19 2200  metroNIDAZOLE (FLAGYL) IVPB 500 mg     500 mg 100 mL/hr over 60 Minutes Intravenous Every 8 hours 06/07/19 1714 06/08/19 0001   06/07/19 1800  ciprofloxacin (CIPRO) IVPB 400 mg     400 mg 200 mL/hr over 60 Minutes Intravenous Every 12 hours 06/07/19 1714 06/08/19 0654   06/07/19 0600  ciprofloxacin (CIPRO) IVPB 400 mg  Status:  Discontinued     400 mg 200 mL/hr over 60 Minutes Intravenous Every 12 hours 06/07/19 0517 06/07/19 1714   06/07/19 0530  metroNIDAZOLE (FLAGYL) IVPB 500 mg  Status:  Discontinued     500 mg 100 mL/hr over 60 Minutes Intravenous Every 8 hours 06/07/19 0517 06/07/19 1714       Assessment/Plan HTN Atrial flutter on eliquisat home Recent right THA 01/2019 Code status DNR  Incarcerated incisional hernia -S/popen reduction and repair of obstructing incisional hernia with overlay mesh (  mesh placed above the anterior fascia), lysis of adhesions- Dr. Kieth Brightly - 06/07/2019 - POD #5 - The patient is voiding well (foley out yesterday), tolerating diet, working well with therapies, pain well controlled with oral pain medication, vital signs stable, incisions c/d/i (hematoma at surgical site stable) and felt stable for discharge. Appears she will be going back to [facility] per notes.  - Cont to mobilize. PT - Pulm toilet, IS  ID -cipro/flagyl 3/18- 3/19 FEN -Reg VTE -SCDs,subq heparin, okay to restart Eliquis. Hgb stable Foley -removed  Follow-Up - Dr. Kieth Brightly    LOS: 5 days    Jillyn Ledger , [facility] Surgery 06/12/2019, 9:19 AM Please see Amion for pager number during day hours 7:00am-4:30pm

## 2019-06-12 NOTE — Progress Notes (Signed)
PROGRESS NOTE  PAW CARBINE [id number] DOB: 10-23-1924   PCP: Marin Olp, MD  Patient is from: SNF  DOA: 06/06/2019 LOS: 5  Brief Narrative / Interim history: 86 year old female with history of A. flutter on Eliquis, diastolic CHF, DDD, osteoarthritis, total right hip replacement, HTN, multiple abdominal surgeries and ventral hernia presenting with abdominal pain and nausea for about 2 days, and admitted for incarcerated ventral hernia as noted on CT abdomen and pelvis.  General surgery consulted.  She underwent open reduction and repair of obstructing incisional hernia with overlay mesh, and lysis of adhesion by Dr. Kieth Brightly on 06/07/2019.    Subjective: Seen and examined earlier this morning.  No major events overnight or this morning.  Reports some abdominal discomfort but not pain.  Denies nausea, vomiting, chest pain, dyspnea or UTI symptoms.  Voiding okay.  LBM yesterday morning.  Passing gases.  Denies melena or hematochezia.  Objective: Vitals:   06/11/19 1556 06/11/19 1937 06/12/19 0315 06/12/19 0840  BP: (!) 171/69 (!) 153/67 (!) 141/58 (!) 155/72  Pulse: 60 63 60 75  Resp: 19 16 15  (!) 21  Temp: 98.2 F (36.8 C) 97.8 F (36.6 C) 98.1 F (36.7 C) 98.1 F (36.7 C)  TempSrc: Oral Oral Oral Oral  SpO2: 98% 99% 97% 90%  Weight:      Height:        Intake/Output Summary (Last 24 hours) at 06/12/2019 1454 Last data filed at 06/12/2019 1300 Gross per 24 hour  Intake --  Output 850 ml  Net -850 ml   Filed Weights   06/06/19 2115 06/07/19 0527 06/07/19 1323  Weight: 52.2 kg 49.1 kg 49.1 kg    Examination:  GENERAL: No acute distress.  Appears well.  HEENT: MMM.  Vision and hearing grossly intact.  NECK: Supple.  No apparent JVD.  RESP:  No IWOB. Good air movement bilaterally. CVS: Regular rhythm.  Normal rate. Heart sounds normal.  ABD/GI/GU: Bowel sounds present. Soft.  Mild tenderness to palpation.  Surgical dressing DCI. MSK/EXT:  Moves  extremities. No apparent deformity. No edema.  SKIN: Surgical dressing DCI. NEURO: Awake, alert and oriented appropriately.  No apparent focal neuro deficit. PSYCH: Calm. Normal affect.  Procedures:  3/18-open reduction and repair of obstructing incisional hernia with overlay mesh, and lysis of adhesion by Dr. Kieth Brightly  Assessment & Plan: Incarcerated infraumbilical hernia-tender and not reducible. -s/p open reduction and repair with overlay mesh and lysis of adhesion by Dr. Kieth Brightly on 3/18 -Okay to resume Eliquis per surgery. -Changed oxycodone to home Norco per patient's request. -Cipro and Flagyl 3/18-3/19 -On regular diet. -Bowel regimen -OOB/PT/OT  A flutter: Bradycardic but not symptomatic. -Resume Eliquis.  Chronic diastolic CHF: Stable.  Appears euvolemic.  On Lasix 30 mg daily at home.  -Continue home Lasix at 20 mg daily -Monitor fluid status  Delirium-resolved. -Frequent reorientation's and delirium precautions  Chronic pain/DJD/osteoarthritis-on Norco at home. -Resume home Norco.  Essential HTN: BP slightly elevated. -Lasix as above. -As needed hydralazine  Acute urinary retention-likely due to opiates.  Indwelling Foley 3/18-3/19 and 3/19-3/21.  Seems to have resolved. -Decrease bethanechol to 10 mg 3 times daily with a goal to discontinue -Continue intermittent bladder scan as needed  Normocytic anemia: Baseline Hgb 13-14> 14 (admit)> 12.5> 9.5> 9.1> 9.3. Likely a combination of hemodilution and blood loss from blood draws.  She could be dehydrated due to abdominal pain and nausea on arrival.  Anemia panel favors ACD.  -Recheck in the morning.  History  of breast cancer: Followed by Dr. Lindi Adie. -Continue home anastrozole  Anxiety/depression/insomnia: Stable -Continue home Zoloft -Home temazepam at reduced dose.  Debility: Patient is from SNF. -PT/OT-SNF               DVT prophylaxis: Subq heparin Code Status: DNR/DNI Family Communication:  Updated patient's son, [name] over the phone on 3/20.  Discharge barrier: Ensure stable H&H after resuming Eliquis. Patient is from: SNF Final disposition: Likely back to SNF on 3/24 if H&H stable.  Consultants: General surgery (off)   Microbiology summarized: -Influenza PCR negative -COVID-19 negative  Sch Meds:  Scheduled Meds: . anastrozole  1 mg Oral Daily  . apixaban  2.5 mg Oral BID  . bethanechol  10 mg Oral TID  . Chlorhexidine Gluconate Cloth  6 each Topical Q0600  . docusate sodium  100 mg Oral BID  . furosemide  20 mg Oral Daily  . sertraline  25 mg Oral QHS  . temazepam  7.5 mg Oral QHS   Continuous Infusions:  PRN Meds:.acetaminophen **OR** acetaminophen, bisacodyl, hydrALAZINE, HYDROcodone-acetaminophen, ondansetron **OR** ondansetron (ZOFRAN) IV, polyethylene glycol, white petrolatum  Antimicrobials: Anti-infectives (From admission, onward)   Start     Dose/Rate Route Frequency Ordered Stop   06/07/19 2200  metroNIDAZOLE (FLAGYL) IVPB 500 mg     500 mg 100 mL/hr over 60 Minutes Intravenous Every 8 hours 06/07/19 1714 06/08/19 0001   06/07/19 1800  ciprofloxacin (CIPRO) IVPB 400 mg     400 mg 200 mL/hr over 60 Minutes Intravenous Every 12 hours 06/07/19 1714 06/08/19 0654   06/07/19 0600  ciprofloxacin (CIPRO) IVPB 400 mg  Status:  Discontinued     400 mg 200 mL/hr over 60 Minutes Intravenous Every 12 hours 06/07/19 0517 06/07/19 1714   06/07/19 0530  metroNIDAZOLE (FLAGYL) IVPB 500 mg  Status:  Discontinued     500 mg 100 mL/hr over 60 Minutes Intravenous Every 8 hours 06/07/19 0517 06/07/19 1714       I have personally reviewed the following labs and images: CBC: Recent Labs  Lab 06/06/19 2102 06/06/19 2102 06/07/19 0625 06/07/19 0625 06/08/19 0306 06/09/19 0918 06/10/19 0245 06/11/19 0341 06/12/19 0254  WBC 7.6  --  7.4  --  9.6  --  7.0 6.3  --   NEUTROABS 5.8  --  5.9  --   --   --   --   --   --   HGB 14.0   < > 13.2   < > 12.5 10.8*  9.5* 9.1* 9.3*  HCT 44.1   < > 40.7   < > 38.9 34.1* 29.6* 28.8* 28.4*  MCV 89.1  --  87.9  --  88.0  --  89.7 90.0  --   PLT 205  --  219  --  200  --  149* 157  --    < > = values in this interval not displayed.   BMP &GFR Recent Labs  Lab 06/06/19 2102 06/08/19 0306 06/09/19 0918 06/11/19 0341  NA 138 136 137 137  K 3.6 3.7 4.1 3.9  CL 98 101 104 104  CO2 29 25 24 24   GLUCOSE 137* 145* 99 106*  BUN 26* 24* 23 27*  CREATININE 0.77 0.86 0.79 0.74  CALCIUM 9.4 8.3* 7.9* 7.6*  MG  --  1.6* 2.1 2.0  PHOS  --   --   --  3.3   Estimated Creatinine Clearance: 33.9 mL/min (by C-G formula based on SCr of 0.74 mg/dL).  Liver & Pancreas: Recent Labs  Lab 06/06/19 2102 06/08/19 0306 06/11/19 0341  AST 22 19  --   ALT 12 10  --   ALKPHOS 115 71  --   BILITOT 0.4 0.9  --   PROT 8.2* 5.9*  --   ALBUMIN 4.4 2.8* 2.3*   No results for input(s): LIPASE, AMYLASE in the last 168 hours. No results for input(s): AMMONIA in the last 168 hours. Diabetic: No results for input(s): HGBA1C in the last 72 hours. No results for input(s): GLUCAP in the last 168 hours. Cardiac Enzymes: No results for input(s): CKTOTAL, CKMB, CKMBINDEX, TROPONINI in the last 168 hours. No results for input(s): PROBNP in the last 8760 hours. Coagulation Profile: Recent Labs  Lab 06/07/19 0625  INR 1.2   Thyroid Function Tests: No results for input(s): TSH, T4TOTAL, FREET4, T3FREE, THYROIDAB in the last 72 hours. Lipid Profile: No results for input(s): CHOL, HDL, LDLCALC, TRIG, CHOLHDL, LDLDIRECT in the last 72 hours. Anemia Panel: Recent Labs    06/10/19 0802  VITAMINB12 447  FOLATE 19.1  FERRITIN 402*  TIBC 157*  IRON 35  RETICCTPCT 0.8   Urine analysis:    Component Value Date/Time   COLORURINE YELLOW 01/30/2019 [place] 01/30/2019 1443   LABSPEC 1.012 01/30/2019 1443   PHURINE 6.0 01/30/2019 [place] 01/30/2019 1443   GLUCOSEU NEGATIVE 01/16/2018 1300    HGBUR NEGATIVE 01/30/2019 1443   [place] 01/30/2019 1443   BILIRUBINUR Negative 12/19/2017 [place] 01/30/2019 1443   PROTEINUR NEGATIVE 01/30/2019 1443   UROBILINOGEN 0.2 01/16/2018 1300   NITRITE NEGATIVE 01/30/2019 1443   LEUKOCYTESUR NEGATIVE 01/30/2019 1443   Sepsis Labs: Invalid input(s): PROCALCITONIN, [place]  Microbiology: Recent Results (from the past 240 hour(s))  Respiratory Panel by RT PCR (Flu A&B, Covid) - Nasopharyngeal Swab     Status: None   Collection Time: 06/06/19  9:40 PM   Specimen: Nasopharyngeal Swab  Result Value Ref Range Status   SARS Coronavirus 2 by RT PCR NEGATIVE NEGATIVE Final    Comment: (NOTE) SARS-CoV-2 target nucleic acids are NOT DETECTED. The SARS-CoV-2 RNA is generally detectable in upper respiratoy specimens during the acute phase of infection. The lowest concentration of SARS-CoV-2 viral copies this assay can detect is 131 copies/mL. A negative result does not preclude SARS-Cov-2 infection and should not be used as the sole basis for treatment or other patient management decisions. A negative result may occur with  improper specimen collection/handling, submission of specimen other than nasopharyngeal swab, presence of viral mutation(s) within the areas targeted by this assay, and inadequate number of viral copies (<131 copies/mL). A negative result must be combined with clinical observations, patient history, and epidemiological information. The expected result is Negative. Fact Sheet for Patients:  PinkCheek.be Fact Sheet for Healthcare Providers:  GravelBags.it This test is not yet ap proved or cleared by the [place] FDA and  has been authorized for detection and/or diagnosis of SARS-CoV-2 by FDA under an Emergency Use Authorization (EUA). This EUA will remain  in effect (meaning this test can be used) for the duration of the COVID-19  declaration under Section 564(b)(1) of the Act, 21 U.S.C. section 360bbb-3(b)(1), unless the authorization is terminated or revoked sooner.    Influenza A by PCR NEGATIVE NEGATIVE Final   Influenza B by PCR NEGATIVE NEGATIVE Final    Comment: (NOTE) The Xpert Xpress SARS-CoV-2/FLU/RSV assay is intended as an aid in  the diagnosis  of influenza from Nasopharyngeal swab specimens and  should not be used as a sole basis for treatment. Nasal washings and  aspirates are unacceptable for Xpert Xpress SARS-CoV-2/FLU/RSV  testing. Fact Sheet for Patients: PinkCheek.be Fact Sheet for Healthcare Providers: GravelBags.it This test is not yet approved or cleared by the [place] FDA and  has been authorized for detection and/or diagnosis of SARS-CoV-2 by  FDA under an Emergency Use Authorization (EUA). This EUA will remain  in effect (meaning this test can be used) for the duration of the  Covid-19 declaration under Section 564(b)(1) of the Act, 21  U.S.C. section 360bbb-3(b)(1), unless the authorization is  terminated or revoked. Performed at [facility], [place] [address], [place], [place] [address]   Surgical pcr screen     Status: None   Collection Time: 06/07/19 12:45 PM   Specimen: Nasal Mucosa; Nasal Swab  Result Value Ref Range Status   MRSA, PCR NEGATIVE NEGATIVE Final   Staphylococcus aureus NEGATIVE NEGATIVE Final    Comment: (NOTE) The Xpert SA Assay (FDA approved for NASAL specimens in patients 22 years of age and older), is one component of a comprehensive surveillance program. It is not intended to diagnose infection nor to guide or monitor treatment. Performed at [facility], [place] [address] [place], [place] [address]     Radiology Studies: No results found.    Shaylan Tutton T. Mazon  If 7PM-7AM, please contact night-coverage [email or web address] Password [facility] 06/12/2019, 2:54  PM

## 2019-06-13 ENCOUNTER — Other Ambulatory Visit: Payer: Self-pay

## 2019-06-13 DIAGNOSIS — D649 Anemia, unspecified: Secondary | ICD-10-CM

## 2019-06-13 DIAGNOSIS — R531 Weakness: Secondary | ICD-10-CM

## 2019-06-13 DIAGNOSIS — R338 Other retention of urine: Secondary | ICD-10-CM

## 2019-06-13 DIAGNOSIS — R5381 Other malaise: Secondary | ICD-10-CM

## 2019-06-13 LAB — HEMOGLOBIN AND HEMATOCRIT, BLOOD
HCT: 30.7 % — ABNORMAL LOW (ref 36.0–46.0)
Hemoglobin: 9.7 g/dL — ABNORMAL LOW (ref 12.0–15.0)

## 2019-06-13 LAB — SARS CORONAVIRUS 2 (TAT 6-24 HRS): SARS Coronavirus 2: NEGATIVE

## 2019-06-13 MED ORDER — SERTRALINE HCL 25 MG PO TABS: 25.0000 mg | ORAL_TABLET | Freq: Every day | ORAL | 3 refills | Status: DC

## 2019-06-13 MED ORDER — HYDROCODONE-ACETAMINOPHEN 5-325 MG PO TABS: 1.0000 | ORAL_TABLET | Freq: Two times a day (BID) | ORAL | 0 refills | Status: DC | PRN

## 2019-06-13 MED ORDER — TEMAZEPAM 7.5 MG PO CAPS: 7.5000 mg | ORAL_CAPSULE | Freq: Every evening | ORAL | 0 refills | Status: DC | PRN

## 2019-06-13 MED ORDER — SENNOSIDES-DOCUSATE SODIUM 8.6-50 MG PO TABS: 1.0000 | ORAL_TABLET | Freq: Two times a day (BID) | ORAL | Status: DC | PRN

## 2019-06-13 NOTE — Care Management Important Message (Signed)
Important Message  Patient Details  Name: Glenda [name] MRN: [id number] Date of Birth: 1924-05-11   Medicare Important Message Given:  Yes     [name] 06/13/2019, 10:01 AM

## 2019-06-13 NOTE — Progress Notes (Signed)
Report called to Herbalist, [place] at [facility].  Discussed medications, and new medications, signs and symptoms to watch for and when to contact the doctor.  Verbalized understanding.

## 2019-06-13 NOTE — TOC Transition Note (Signed)
Transition of Care [facility]) - CM/SW Discharge Note   Patient Details  Name: Glenda [name] MRN: [id number] Date of Birth: January 29, 1925  [facility] of [facility]) CM/SW Contact:  Vinie Sill, [place] Phone Number: 06/13/2019, 1:51 PM   Clinical Narrative:     Patient will DC to: [facility] DC Date: 06/13/19 Family Notified: [name],son Transport [id number]  RN, patient, and facility notified of DC. Discharge Summary sent to facility. RN given number for [phone]. Ambulance transport requested for patient.   Clinical Social Worker signing off.  Thurmond Butts, MSW, LCSWA Clinical Social Worker    Final next level of care: Assisted Living Barriers to Discharge: No Barriers Identified   Patient Goals and CMS Choice   CMS Medicare.gov Compare Post Acute Care list provided to:: Patient Represenative (must comment)(Son [name]) Choice offered to / list presented to : Adult Children(Son [name])  Discharge Placement              Patient chooses bed at: ([facility]) Patient to be transferred to facility by: [place] Name of family member notified: [name], son Patient and family notified of of transfer: 06/13/19  Discharge Plan and Services     Post Acute Care Choice: (Return to [place])                      [facility]: [facility])        Social Determinants of Health (SDOH) Interventions     Readmission Risk Interventions Readmission Risk Prevention Plan 02/06/2019  Transportation Screening Complete  PCP or Specialist Appt within 3-5 Days Not Complete  Not Complete comments DC date unknown but established with providers  [place] or [place] Complete  Social Work Consult for [place] Planning/Counseling Complete  Palliative Care Screening Not Complete  Palliative Care Screening Not Complete Comments no desire  Medication Review (RN Transport planner) Referral to Pharmacy  Some recent data might be hidden

## 2019-06-13 NOTE — Plan of Care (Signed)
  Problem: Education: Goal: Knowledge of General Education information will improve Description: Including pain rating scale, medication(s)/side effects and non-pharmacologic comfort measures Outcome: Progressing   Problem: Health Behavior/Discharge Planning: Goal: Ability to manage health-related needs will improve Outcome: Progressing   Problem: Clinical Measurements: Goal: Ability to maintain clinical measurements within normal limits will improve Outcome: Progressing Goal: Will remain free from infection Outcome: Progressing Goal: Diagnostic test results will improve Outcome: Progressing Goal: Respiratory complications will improve Outcome: Progressing Goal: Cardiovascular complication will be avoided Outcome: Progressing   Problem: Activity: Goal: Risk for activity intolerance will decrease Outcome: Progressing   Problem: Nutrition: Goal: Adequate nutrition will be maintained Outcome: Progressing   Problem: Elimination: Goal: Will not experience complications related to bowel motility Outcome: Progressing   Problem: Pain Managment: Goal: General experience of comfort will improve Outcome: Progressing   Problem: Skin Integrity: Goal: Risk for impaired skin integrity will decrease Outcome: Progressing   Problem: Education: Goal: Required Educational Video(s) Outcome: Progressing   Problem: Clinical Measurements: Goal: Ability to maintain clinical measurements within normal limits will improve Outcome: Progressing   Problem: Skin Integrity: Goal: Demonstration of wound healing without infection will improve Outcome: Progressing

## 2019-06-13 NOTE — NC FL2 (Addendum)
[place] LEVEL OF CARE SCREENING TOOL     IDENTIFICATION  Patient Name: DARIELLE SPADY Birthdate: 1924-08-19 Sex: female Admission Date (Current Location): 06/06/2019  [facility] [place] Number:  Herbalist and Address:  [facility], [place] [address], [place], [place] [address]      Provider Number: [id number]  Attending Physician Name and Address:  Mercy Riding, MD  Relative Name and Phone Number:  [name] [phone]    Current Level of Care: Hospital Recommended Level of Care: [place] Prior Approval Number:    Date Approved/Denied:   PASRR Number:    Discharge Plan: (ALF)    Current Diagnoses: Patient Active Problem List   Diagnosis Date Noted  . Incarcerated ventral hernia 06/06/2019  . DNR (do not resuscitate) 06/06/2019  . Bradycardia 02/20/2019  . Status post total replacement of right hip 02/05/2019  . Osteoarthritis of right hip 02/01/2019  . Hyponatremia 10/19/2018  . Femur fracture, right ([place]) 10/18/2018  . Hip fracture ([place]) 10/18/2018  . Anxiety 08/08/2018  . Abdominal hernia 05/19/2018  . Chronic pain syndrome 02/09/2018  . Chronic narcotic use 08/11/2017  . Malignant neoplasm of upper-outer quadrant of left breast in female, estrogen receptor positive ([place]) 06/27/2017  . History of cardioversion 08/20/2016  . Atrial flutter with rapid ventricular response ([place]) 08/19/2016  . Chronic anticoagulation-Eliquis 02/04/2016  . Atrial flutter ([place])   . Shortness of breath 12/13/2014  . Palpitations 12/13/2014  . Constipation 11/06/2012  . Accelerated/malignant hypertension 09/14/2012  . Chronic diastolic HF (heart failure) ([place]) 07/11/2012  . Osteoarthritis 04/03/2012  . Anemia 06/10/2011  . Essential hypertension 03/25/2011  . Insomnia 09/15/2010    Orientation RESPIRATION BLADDER Height & Weight     Self, Time, Situation, Place  Normal Continent Weight: 108 lb 3.9 oz  (49.1 kg) Height:  5\' 1"  (154.9 cm)  BEHAVIORAL SYMPTOMS/MOOD NEUROLOGICAL BOWEL NUTRITION STATUS      Continent Diet(low sodium heart healthy)  AMBULATORY STATUS COMMUNICATION OF NEEDS Skin   Limited Assist Verbally Surgical wounds(closed incision,abdomen)                       Personal Care Assistance Level of Assistance  Bathing, Feeding, Dressing Bathing Assistance: Limited assistance Feeding assistance: Independent Dressing Assistance: Limited assistance     Functional Limitations Info  Sight, Hearing, Speech Sight Info: Adequate Hearing Info: Adequate Speech Info: Adequate    SPECIAL CARE FACTORS FREQUENCY  PT (By licensed PT), OT (By licensed OT)     PT Frequency: 3x per week OT Frequency: 3x per week            Contractures Contractures Info: Not present    Additional Factors Info  Code Status, Allergies Code Status Info: DNR Allergies Info: Ace Inhibitors,Halcion,Pentazocine Lactate,Sulfa Drugs Cross Reactors,Trazodone And Nefazodone,Amitriptyline,Avelox,Penicillins           Current Medications (06/13/2019):  This is the current hospital active medication list Current Facility-Administered Medications  Medication Dose Route Frequency Provider Last Rate Last Admin  . acetaminophen (TYLENOL) tablet 650 mg  650 mg Oral Q6H PRN Maczis, Barth Kirks, PA-C       Or  . acetaminophen (TYLENOL) suppository 650 mg  650 mg Rectal Q6H PRN Maczis, Barth Kirks, PA-C      . anastrozole (ARIMIDEX) tablet 1 mg  1 mg Oral Daily Wendee Beavers T, MD   1 mg at 06/13/19 1055  . apixaban (ELIQUIS) tablet 2.5  mg  2.5 mg Oral BID Wendee Beavers T, MD   2.5 mg at 06/13/19 1055  . bethanechol (URECHOLINE) tablet 10 mg  10 mg Oral TID Wendee Beavers T, MD   10 mg at 06/13/19 1055  . bisacodyl (DULCOLAX) suppository 10 mg  10 mg Rectal Daily PRN Rolm Bookbinder, MD      . Chlorhexidine Gluconate Cloth 2 % PADS 6 each  6 each Topical Q0600 Mercy Riding, MD   6 each at 06/11/19 0530  .  furosemide (LASIX) tablet 20 mg  20 mg Oral Daily Wendee Beavers T, MD   20 mg at 06/13/19 1056  . hydrALAZINE (APRESOLINE) injection 10 mg  10 mg Intravenous Q4H PRN Jillyn Ledger, PA-C   10 mg at 06/07/19 0855  . HYDROcodone-acetaminophen (NORCO/VICODIN) 5-325 MG per tablet 1 tablet  1 tablet Oral Q8H PRN Mercy Riding, MD   1 tablet at 06/12/19 0942  . ondansetron (ZOFRAN) tablet 4 mg  4 mg Oral Q6H PRN Maczis, Barth Kirks, PA-C       Or  . ondansetron [facility]) injection 4 mg  4 mg Intravenous Q6H PRN Jillyn Ledger, PA-C   4 mg at 06/07/19 [id number]  . polyethylene glycol (MIRALAX / GLYCOLAX) packet 17 g  17 g Oral BID PRN Wendee Beavers T, MD   17 g at 06/13/19 1103  . senna-docusate (Senokot-S) tablet 1 tablet  1 tablet Oral BID PRN Wendee Beavers T, MD      . sertraline (ZOLOFT) tablet 25 mg  25 mg Oral QHS Wendee Beavers T, MD   25 mg at 06/12/19 2104  . temazepam (RESTORIL) capsule 7.5 mg  7.5 mg Oral QHS Wendee Beavers T, MD   7.5 mg at 06/12/19 2104  . white petrolatum (VASELINE) gel   Topical PRN Jillyn Ledger, PA-C         Discharge Medications:       TAKE these medications   anastrozole 1 MG tablet Commonly known as: ARIMIDEX Take 1 tablet (1 mg total) by mouth daily.   apixaban 2.5 MG Tabs tablet Commonly known as: Eliquis Take 1 tablet (2.5 mg total) by mouth 2 (two) times daily.   CEROVITE ADVANCED FORMULA PO Take 1 tablet by mouth daily. (0800)   Cholecalciferol 25 MCG (1000 UT) tablet Take 1,000 Units by mouth daily.   Cranberry 425 MG Caps Take 425 mg by mouth 2 (two) times daily.   docusate sodium 100 MG capsule Commonly known as: COLACE Take 100 mg by mouth 2 (two) times daily.   furosemide 20 MG tablet Commonly known as: LASIX Take 1.5 tablets (30 mg total) by mouth daily.   HYDROcodone-acetaminophen 5-325 MG tablet Commonly known as: NORCO/VICODIN Take 1 tablet by mouth 2 (two) times daily. At 9 Am and 3 PM. Separate by 6 hours from temazepam. Fill  in 2 months   omeprazole 40 MG capsule Commonly known as: PRILOSEC Take 40 mg by mouth daily.   ondansetron 4 MG tablet Commonly known as: ZOFRAN Take 4 mg by mouth every 6 (six) hours as needed for nausea.   polyethylene glycol 17 g packet Commonly known as: MIRALAX / GLYCOLAX Take 17 g by mouth See admin instructions. Mix 17 grams in 8 ounces of juice/water and drink daily, Monday through Saturday   sertraline 25 MG tablet Commonly known as: ZOLOFT TAKE (1) TABLET BY MOUTH ONCE DAILY. What changed:   how much to take  how to take this  when to take this   temazepam 7.5 MG capsule Commonly known as: RESTORIL Take 1 capsule (7.5 mg total) by mouth at bedtime as needed for sleep. What changed:   medication strength  See the new instructions.   tiZANidine 2 MG tablet Commonly known as: ZANAFLEX Take 1 tablet (2 mg total) by mouth every 6 (six) hours as needed. What changed: reasons to take this        Relevant Imaging Results:  Relevant Lab Results:   Additional Information SSN [id number]  Vinie Sill, [place]

## 2019-06-13 NOTE — Progress Notes (Signed)
6 Days Post-Op  Subjective: CC: Doing well. Abdominal pain minimal and around midline. No n/v. Tolerating diet. Passing flatus. Had a BM yesterday. Voiding without difficulty.   Objective: Vital signs in last 24 hours: Temp:  [97.9 F (36.6 C)-98.8 F (37.1 C)] 97.9 F (36.6 C) (03/24 0313) Pulse Rate:  [71-80] 80 (03/24 0313) Resp:  [17-20] 20 (03/24 0313) BP: (138-139)/(54-72) 139/72 (03/24 0313) SpO2:  [91 %-98 %] 91 % (03/24 0313) Last BM Date: 06/12/19  Intake/Output from previous day: 03/23 0701 - 03/24 0700 In: -  Out: 276 [Urine:275; Stool:1] Intake/Output this shift: Total I/O In: -  Out: 200 [Urine:200]  PE: Gen: Alert, NAD, pleasant Pulm: rate and effort normal Abd: Soft,ND,hematoma noted at surgical site. No signs of skin infection. She isappropriately tender around midline incision. Midline woundc/d/i.+BS Ext: no BUE/BLE edema, calves soft and nontender  Lab Results:  Recent Labs    06/11/19 0341 06/11/19 0341 06/12/19 0254 06/13/19 0317  WBC 6.3  --   --   --   HGB 9.1*   < > 9.3* 9.7*  HCT 28.8*   < > 28.4* 30.7*  PLT 157  --   --   --    < > = values in this interval not displayed.   BMET Recent Labs    06/11/19 0341  NA 137  K 3.9  CL 104  CO2 24  GLUCOSE 106*  BUN 27*  CREATININE 0.74  CALCIUM 7.6*   PT/INR No results for input(s): LABPROT, INR in the last 72 hours. CMP     Component Value Date/Time   NA 137 06/11/2019 0341   NA 138 04/30/2019 1123   K 3.9 06/11/2019 0341   CL 104 06/11/2019 0341   CO2 24 06/11/2019 0341   GLUCOSE 106 (H) 06/11/2019 0341   BUN 27 (H) 06/11/2019 0341   BUN 23 04/30/2019 1123   CREATININE 0.74 06/11/2019 0341   CREATININE 1.28 (H) 08/02/2016 1420   CALCIUM 7.6 (L) 06/11/2019 0341   PROT 5.9 (L) 06/08/2019 0306   PROT 7.1 03/20/2018 1059   ALBUMIN 2.3 (L) 06/11/2019 0341   ALBUMIN 4.3 03/20/2018 1059   AST 19 06/08/2019 0306   ALT 10 06/08/2019 0306   ALKPHOS 71 06/08/2019  0306   BILITOT 0.9 06/08/2019 0306   BILITOT 0.5 03/20/2018 1059   GFRNONAA >60 06/11/2019 0341   GFRAA >60 06/11/2019 0341   Lipase  No results found for: LIPASE     Studies/Results: No results found.  Anti-infectives: Anti-infectives (From admission, onward)   Start     Dose/Rate Route Frequency Ordered Stop   06/07/19 2200  metroNIDAZOLE (FLAGYL) IVPB 500 mg     500 mg 100 mL/hr over 60 Minutes Intravenous Every 8 hours 06/07/19 1714 06/08/19 0001   06/07/19 1800  ciprofloxacin (CIPRO) IVPB 400 mg     400 mg 200 mL/hr over 60 Minutes Intravenous Every 12 hours 06/07/19 1714 06/08/19 0654   06/07/19 0600  ciprofloxacin (CIPRO) IVPB 400 mg  Status:  Discontinued     400 mg 200 mL/hr over 60 Minutes Intravenous Every 12 hours 06/07/19 0517 06/07/19 1714   06/07/19 0530  metroNIDAZOLE (FLAGYL) IVPB 500 mg  Status:  Discontinued     500 mg 100 mL/hr over 60 Minutes Intravenous Every 8 hours 06/07/19 0517 06/07/19 1714       Assessment/Plan HTN Atrial flutter on eliquisat home Recent right THA 01/2019 Code status DNR  Incarcerated incisional hernia -S/popen reduction  and repair of obstructing incisional hernia with overlay mesh (mesh placed above the anterior fascia), lysis of adhesions- Dr. Kieth Brightly - 06/07/2019 - POD #6 -The patient is voiding well, tolerating a diet, working well with therapies, pain well controlled with oral pain medication, vital signs stable, incisions c/d/i (hematoma at surgical site stable) and felt stable for discharge. Appears she will be going back to [facility] per notes.  - Cont to mobilize. PT - Pulm toilet, IS  ID -cipro/flagyl 3/18- 3/19 FEN -Reg VTE -SCDs,Eliquis. Hgb stable Foley -removed  Follow-Up - Dr. Kieth Brightly   Plan: Okay for discharge from a general surgery standpoint    LOS: 6 days    Jillyn Ledger , [facility] 06/13/2019, 9:06 AM Please see Amion for pager number during day  hours 7:00am-4:30pm

## 2019-06-13 NOTE — Plan of Care (Signed)
  Problem: Education: Goal: Knowledge of General Education information will improve Description: Including pain rating scale, medication(s)/side effects and non-pharmacologic comfort measures 06/13/2019 1748 by Glenard Haring, RN Outcome: Adequate for Discharge 06/13/2019 1252 by Glenard Haring, RN Outcome: Progressing   Problem: Health Behavior/Discharge Planning: Goal: Ability to manage health-related needs will improve 06/13/2019 1748 by Glenard Haring, RN Outcome: Adequate for Discharge 06/13/2019 1252 by Glenard Haring, RN Outcome: Progressing   Problem: Clinical Measurements: Goal: Ability to maintain clinical measurements within normal limits will improve 06/13/2019 1748 by Glenard Haring, RN Outcome: Adequate for Discharge 06/13/2019 1252 by Glenard Haring, RN Outcome: Progressing Goal: Will remain free from infection 06/13/2019 1748 by Glenard Haring, RN Outcome: Adequate for Discharge 06/13/2019 1252 by Glenard Haring, RN Outcome: Progressing Goal: Diagnostic test results will improve 06/13/2019 1748 by Glenard Haring, RN Outcome: Adequate for Discharge 06/13/2019 1252 by Glenard Haring, RN Outcome: Progressing Goal: Respiratory complications will improve 06/13/2019 1748 by Glenard Haring, RN Outcome: Adequate for Discharge 06/13/2019 1252 by Glenard Haring, RN Outcome: Progressing Goal: Cardiovascular complication will be avoided 06/13/2019 1748 by Glenard Haring, RN Outcome: Adequate for Discharge 06/13/2019 1252 by Glenard Haring, RN Outcome: Progressing   Problem: Nutrition: Goal: Adequate nutrition will be maintained 06/13/2019 1748 by Glenard Haring, RN Outcome: Adequate for Discharge 06/13/2019 1252 by Glenard Haring, RN Outcome: Progressing   Problem: Coping: Goal: Level of anxiety will decrease 06/13/2019 1748 by Glenard Haring, RN Outcome: Adequate for Discharge 06/13/2019 1252 by Glenard Haring, RN Outcome:  Progressing   Problem: Elimination: Goal: Will not experience complications related to bowel motility 06/13/2019 1748 by Glenard Haring, RN Outcome: Adequate for Discharge 06/13/2019 1252 by Glenard Haring, RN Outcome: Progressing Goal: Will not experience complications related to urinary retention 06/13/2019 1748 by Glenard Haring, RN Outcome: Adequate for Discharge 06/13/2019 1252 by Glenard Haring, RN Outcome: Progressing   Problem: Pain Managment: Goal: General experience of comfort will improve 06/13/2019 1748 by Glenard Haring, RN Outcome: Adequate for Discharge 06/13/2019 1252 by Glenard Haring, RN Outcome: Progressing   Problem: Skin Integrity: Goal: Risk for impaired skin integrity will decrease 06/13/2019 1748 by Glenard Haring, RN Outcome: Adequate for Discharge 06/13/2019 1252 by Glenard Haring, RN Outcome: Progressing   Problem: Safety: Goal: Ability to remain free from injury will improve 06/13/2019 1748 by Glenard Haring, RN Outcome: Adequate for Discharge 06/13/2019 1252 by Glenard Haring, RN Outcome: Progressing   Problem: Education: Goal: Required Educational Video(s) 06/13/2019 1748 by Glenard Haring, RN Outcome: Adequate for Discharge 06/13/2019 1252 by Glenard Haring, RN Outcome: Progressing   Problem: Clinical Measurements: Goal: Ability to maintain clinical measurements within normal limits will improve 06/13/2019 1748 by Glenard Haring, RN Outcome: Adequate for Discharge 06/13/2019 1252 by Glenard Haring, RN Outcome: Progressing Goal: Postoperative complications will be avoided or minimized 06/13/2019 1748 by Glenard Haring, RN Outcome: Adequate for Discharge 06/13/2019 1252 by Glenard Haring, RN Outcome: Progressing   Problem: Skin Integrity: Goal: Demonstration of wound healing without infection will improve 06/13/2019 1748 by Glenard Haring, RN Outcome: Adequate for Discharge 06/13/2019 1252 by  Glenard Haring, RN Outcome: Progressing

## 2019-06-13 NOTE — Discharge Summary (Signed)
Physician Discharge Summary  Glenda [name] [id number] DOB: [birth year] DOA: 06/06/2019  PCP: Marin Olp, MD  Admit date: 06/06/2019 Discharge date: 06/13/2019  Admitted From: [facility] Disposition: [facility]  Recommendations for Outpatient Follow-up:  1. Follow ups as below. 2. Please obtain CBC/BMP/Mag at follow up 3. Please follow up on the following pending results: None  Home Health: PT/OT Equipment/Devices: None.  Patient has required DME  Discharge Condition: Stable CODE STATUS: DNR/DNI  Follow-up Information    Kinsinger, Arta Bruce, MD. Go on 06/28/2019.   Specialty: General Surgery Why: Your appointment is 4/8 at 2:10pm Please arrive 30 mintues prior to your appointment to check in and fill out paperwork. Bring photo ID and insurance information. Contact information: [address] [place] [address] [phone]        Marin Olp, MD. Schedule an appointment as soon as possible for a visit in 1 week(s).   Specialty: Family Medicine Contact information: [place] [address] [phone]        Lelon Perla, MD. Schedule an appointment as soon as possible for a visit in 2 week(s).   Specialty: Cardiology Contact information: [address] [place] [address] [phone]           Hospital Course: 86 year old female with history of A. flutter on Eliquis, diastolic CHF, DDD, osteoarthritis, total right hip replacement, HTN, multiple abdominal surgeries and ventral hernia presenting with abdominal pain and nausea for about 2 days, and admitted for incarcerated ventral hernia as noted on CT abdomen and pelvis.  General surgery consulted.  She underwent open reduction and repair of obstructing incisional hernia with overlay mesh, and lysis of adhesion by Dr. Kieth Brightly on 06/07/2019.   Patient had brief postop urinary retention and drop in hemoglobin.  Urinary retention  resolved.  Hemoglobin eventually stabilized and remained stable after resuming Eliquis.  Evaluated by PT/OT who recommended home health.  Outpatient follow-up with PCP and surgery as above.  See individual problem list below for more hospital course.  Discharge Diagnoses:  Incarcerated infraumbilical hernia-tender and not reducible. -s/p open reduction and repair with overlay mesh and lysis of adhesion by Dr. Kieth Brightly on 3/18 -Eliquis resumed.  H&H stable. -Cipro and Flagyl 3/18-3/19 -Tolerating regular diet. -Bowel regimen -OOB/PT/OT  A flutter: Bradycardic but not symptomatic.  Not on nodal blocking agents. -Eliquis resumed.  Chronic diastolic CHF: Stable.  Appears euvolemic.  On Lasix 30 mg daily at home.  -Discharged on home Lasix.  Delirium-resolved. -Frequent reorientation's and delirium precautions  Chronic pain/DJD/osteoarthritis-on Norco at home. -Continue home Norco  Essential HTN:  Normotensive for most part. -On Lasix as above.  Acute urinary retention-likely due to opiates.  Indwelling Foley 3/18-3/19 and 3/19-3/21.   Urinary retention resolved.  Normocytic anemia: Baseline Hgb 13-14> 14 (admit)> 12.5> 9.5> 9.1> 9.3> 9.7. Likely a combination of hemodilution and blood loss from blood draws.  She could be dehydrated due to abdominal pain and nausea on arrival.  Anemia panel favors ACD.  Hemoccult negative x2. -Recheck CBC in 1 week  History of breast cancer: Followed by Dr. Lindi Adie. -Continue home anastrozole  Anxiety/depression/insomnia: Stable -Continue home Zoloft -Reduced home temazepam to 7.5 mg  Debility: Patient is from SNF. -Ongoing PT/OT.    Discharge Instructions  Discharge Instructions    Call MD for:  difficulty breathing, headache or visual disturbances   Complete by: As directed    Call MD for:  extreme  fatigue   Complete by: As directed    Call MD for:  persistant dizziness or light-headedness   Complete by: As directed     Call MD for:  persistant nausea and vomiting   Complete by: As directed    Call MD for:  redness, tenderness, or signs of infection (pain, swelling, redness, odor or green/yellow discharge around incision site)   Complete by: As directed    Call MD for:  severe uncontrolled pain   Complete by: As directed    Call MD for:  temperature >100.4   Complete by: As directed    Diet - low sodium heart healthy   Complete by: As directed    Increase activity slowly   Complete by: As directed      Allergies as of 06/13/2019      Reactions   Ace Inhibitors Other (See Comments)   Cough   Halcion [triazolam]    UNSPECIFIED REACTION    Pentazocine Lactate    UNSPECIFIED REACTION    Sulfa Drugs Cross Reactors    UNSPECIFIED REACTION    Trazodone And Nefazodone Other (See Comments)   Per MAR   Amitriptyline Other (See Comments)   Hyperactivity   Avelox [moxifloxacin Hcl In Nacl] Other (See Comments)   dizziness   Penicillins Rash      Medication List    STOP taking these medications   benzonatate 100 MG capsule Commonly known as: TESSALON     TAKE these medications   anastrozole 1 MG tablet Commonly known as: ARIMIDEX Take 1 tablet (1 mg total) by mouth daily.   apixaban 2.5 MG Tabs tablet Commonly known as: Eliquis Take 1 tablet (2.5 mg total) by mouth 2 (two) times daily.   CEROVITE ADVANCED FORMULA PO Take 1 tablet by mouth daily. (0800)   Cholecalciferol 25 MCG (1000 UT) tablet Take 1,000 Units by mouth daily.   Cranberry 425 MG Caps Take 425 mg by mouth 2 (two) times daily.   docusate sodium 100 MG capsule Commonly known as: COLACE Take 100 mg by mouth 2 (two) times daily.   furosemide 20 MG tablet Commonly known as: LASIX Take 1.5 tablets (30 mg total) by mouth daily.   HYDROcodone-acetaminophen 5-325 MG tablet Commonly known as: NORCO/VICODIN Take 1 tablet by mouth 2 (two) times daily. At 9 Am and 3 PM. Separate by 6 hours from temazepam. Fill in 2 months     omeprazole 40 MG capsule Commonly known as: PRILOSEC Take 40 mg by mouth daily.   ondansetron 4 MG tablet Commonly known as: ZOFRAN Take 4 mg by mouth every 6 (six) hours as needed for nausea.   polyethylene glycol 17 g packet Commonly known as: MIRALAX / GLYCOLAX Take 17 g by mouth See admin instructions. Mix 17 grams in 8 ounces of juice/water and drink daily, Monday through Saturday   sertraline 25 MG tablet Commonly known as: ZOLOFT TAKE (1) TABLET BY MOUTH ONCE DAILY. What changed:   how much to take  how to take this  when to take this   temazepam 7.5 MG capsule Commonly known as: RESTORIL Take 1 capsule (7.5 mg total) by mouth at bedtime as needed for sleep. What changed:   medication strength  See the new instructions.   tiZANidine 2 MG tablet Commonly known as: ZANAFLEX Take 1 tablet (2 mg total) by mouth every 6 (six) hours as needed. What changed: reasons to take this       Consultations:  General surgery  Procedures/Studies: -06/07/2019 open reduction and repair with overlay mesh and lysis of adhesion by Dr. Kieth Brightly    CT ABDOMEN PELVIS W CONTRAST  Result Date: 06/06/2019 CLINICAL DATA:  86 year old female with complicated hernia. EXAM: CT ABDOMEN AND PELVIS WITH CONTRAST TECHNIQUE: Multidetector CT imaging of the abdomen and pelvis was performed using the standard protocol following bolus administration of intravenous contrast. CONTRAST:  53mL OMNIPAQUE IOHEXOL 300 MG/ML  SOLN COMPARISON:  None. FINDINGS: Lower chest: The visualized lung bases are clear. There is moderate cardiomegaly. Partial calcification of the aortic annulus. No intra-abdominal free air or free fluid. Hepatobiliary: The liver is unremarkable. Mild intrahepatic biliary ductal dilatation. The gallbladder is unremarkable. Pancreas: Unremarkable. No pancreatic ductal dilatation or surrounding inflammatory changes. Spleen: Normal in size without focal abnormality. Adrenals/Urinary  Tract: The adrenal glands are unremarkable. There is no hydronephrosis on either side. There is symmetric enhancement and excretion of contrast by both kidneys. The urinary bladder is grossly unremarkable. Stomach/Bowel: There is moderate stool throughout the colon. There are scattered colonic diverticula without active inflammatory changes. There is a ventral infraumbilical hernia containing a segment of small bowel. The neck of the hernia defect measures approximately 12 mm. There is pinching of the bowel at the neck of the hernia with mild dilatation herniated segment and segment of the small bowel proximal to the hernia. The small bowel proximal to the hernia measures approximately 3 cm in diameter. There is mild associated inflammatory changes. There is a small hiatal hernia. The appendix is not visualized with certainty. No inflammatory changes identified in the right lower quadrant. Vascular/Lymphatic: Moderate aortoiliac atherosclerotic disease. The IVC is unremarkable. No portal venous gas. There is no adenopathy. Reproductive: The uterus is grossly unremarkable. No adnexal masses. Other: None Musculoskeletal: Osteopenia with degenerative changes of the spine and multilevel disc desiccation and vacuum phenomena. There is a total right hip arthroplasty. Vertebroplasty changes of the sacrum. No acute osseous pathology. IMPRESSION: 1. Ventral infraumbilical hernia containing a segment of small bowel with associated mild or early obstruction. 2. Colonic diverticulosis. No active inflammatory changes. 3. Aortic Atherosclerosis (ICD10-I70.0). Electronically Signed   By: Anner Crete M.D.   On: 06/06/2019 22:46   DG Abd Portable 1V  Result Date: 06/07/2019 CLINICAL DATA:  Reported incarcerated ventral hernia EXAM: PORTABLE ABDOMEN - 1 VIEW COMPARISON:  CT abdomen and pelvis June 06, 2019 FINDINGS: There is no appreciable bowel dilatation or air-fluid level. No free air. Contrast is noted in the urinary  bladder. There is a total hip replacement on the right. Visualized lung bases are clear. There is aortic atherosclerosis. Evidence of previous sacroplasty on the right superiorly. IMPRESSION: No apparent bowel obstruction by radiography. No appreciable bowel dilatation or air-fluid levels. No free air. Electronically Signed   By: Lowella Grip III M.D.   On: 06/07/2019 08:53   ECHOCARDIOGRAM COMPLETE  Result Date: 05/15/2019    ECHOCARDIOGRAM REPORT   Patient Name:   AALEAH DINOLFO Date of Exam: 05/15/2019 Medical Rec #:  [id number]        Height:       61.0 in Accession #:    [id number]       Weight:       114.0 lb Date of Birth:  11-Apr-1924        BSA:          1.487 m Patient Age:    62 years         BP:  128/60 mmHg Patient Gender: F                HR:           75 bpm. Exam Location:  [address] Procedure: 2D Echo, Cardiac Doppler and Color Doppler Indications:    I31.3  History:        Patient has prior history of Echocardiogram examinations, most                 recent 02/09/2019. Pericardial effusion, Arrythmias:Atrial                 Flutter; Risk Factors:Hypertension.  Sonographer:    Coralyn Helling RDCS Referring Phys: [place]  1. Left ventricular ejection fraction, by estimation, is 60 to 65%. The left ventricle has normal function. The left ventricle has no regional wall motion abnormalities. There is mild asymmetric left ventricular hypertrophy of the septal segment. Left ventricular diastolic parameters are indeterminate.  2. Right ventricular systolic function is normal. The right ventricular size is normal. There is normal pulmonary artery systolic pressure.  3. Left atrial size was mildly dilated.  4. The mitral valve is normal in structure and function. Mild mitral valve regurgitation. No evidence of mitral stenosis.  5. The aortic valve is tricuspid. Aortic valve regurgitation is mild. No aortic stenosis is present.  6. The inferior vena cava is  normal in size with greater than 50% respiratory variability, suggesting right atrial pressure of 3 mmHg.  7. Side by side comparisons were done with echo 01/2019 showing no significant changes. FINDINGS  Left Ventricle: Left ventricular ejection fraction, by estimation, is 60 to 65%. The left ventricle has normal function. The left ventricle has no regional wall motion abnormalities. The left ventricular internal cavity size was normal in size. There is  mild asymmetric left ventricular hypertrophy of the septal segment. Left ventricular diastolic parameters are indeterminate. Indeterminate filling pressures. Right Ventricle: The right ventricular size is normal. No increase in right ventricular wall thickness. Right ventricular systolic function is normal. There is normal pulmonary artery systolic pressure. The tricuspid regurgitant velocity is 2.32 m/s, and  with an assumed right atrial pressure of 3 mmHg, the estimated right ventricular systolic pressure is 123456 mmHg. Left Atrium: Left atrial size was mildly dilated. Right Atrium: Right atrial size was normal in size. Pericardium: Trivial pericardial effusion is present. The pericardial effusion is circumferential. Mitral Valve: The mitral valve is normal in structure and function. Normal mobility of the mitral valve leaflets. Severe mitral annular calcification. Mild mitral valve regurgitation. No evidence of mitral valve stenosis. Tricuspid Valve: The tricuspid valve is normal in structure. Tricuspid valve regurgitation is mild . No evidence of tricuspid stenosis. Aortic Valve: The aortic valve is tricuspid. . There is moderate thickening and moderate calcification of the aortic valve. Aortic valve regurgitation is mild. Aortic regurgitation PHT measures 780 msec. No aortic stenosis is present. There is moderate thickening of the aortic valve. There is moderate calcification of the aortic valve. Pulmonic Valve: The pulmonic valve was normal in structure.  Pulmonic valve regurgitation is mild. No evidence of pulmonic stenosis. Aorta: The aortic root is normal in size and structure. Venous: The inferior vena cava is normal in size with greater than 50% respiratory variability, suggesting right atrial pressure of 3 mmHg. IAS/Shunts: The interatrial septum appears to be lipomatous. No atrial level shunt detected by color flow Doppler.  LEFT VENTRICLE PLAX 2D LVIDd:  4.20 cm LVIDs:         3.00 cm LV PW:         1.00 cm LV IVS:        1.20 cm LVOT diam:     2.00 cm LV SV:         54 LV SV Index:   36 LVOT Area:     3.14 cm  RIGHT VENTRICLE            IVC RVSP:           24.5 mmHg  IVC diam: 1.10 cm LEFT ATRIUM             Index       RIGHT ATRIUM           Index LA diam:        4.00 cm 2.69 cm/m  RA Pressure: 3.00 mmHg LA Vol (A2C):   60.9 ml 40.94 ml/m RA Area:     16.20 cm LA Vol (A4C):   53.9 ml 36.24 ml/m RA Volume:   36.50 ml  24.54 ml/m LA Biplane Vol: 59.3 ml 39.87 ml/m  AORTIC VALVE LVOT Vmax:   73.48 cm/s LVOT Vmean:  49.780 cm/s LVOT VTI:    0.171 m AI PHT:      780 msec  AORTA Ao Root diam: 2.50 cm Ao Asc diam:  2.80 cm MV E velocity: 89.78 cm/s  TRICUSPID VALVE MV A velocity: 87.50 cm/s  TR Peak grad:   21.5 mmHg MV E/A ratio:  1.03        TR Vmax:        232.00 cm/s                            Estimated RAP:  3.00 mmHg                            RVSP:           24.5 mmHg                             SHUNTS                            Systemic VTI:  0.17 m                            Systemic Diam: 2.00 cm Fransico Him MD Electronically signed by Fransico Him MD Signature Date/Time: 05/15/2019/3:02:53 PM    Final        Discharge Exam: Vitals:   06/12/19 2013 06/13/19 0313  BP: 138/65 139/72  Pulse: 71 80  Resp: 17 20  Temp: 98.8 F (37.1 C) 97.9 F (36.6 C)  SpO2: 98% 91%    GENERAL: No acute distress.  Appears well.  HEENT: MMM.  Vision and hearing grossly intact.  NECK: Supple.  No apparent JVD.  RESP:  No IWOB. Good air  movement bilaterally. CVS: Irregular rhythm.  Normal rate. Heart sounds normal.  ABD/GI/GU: Bowel sounds present. Soft. Non tender.  Surgical wound clean and dry without signs of infection or drainage. MSK/EXT:  Moves extremities. No apparent deformity or edema.  SKIN: no apparent skin lesion or wound NEURO: Awake, alert and oriented appropriately.  No apparent focal neuro deficit.  PSYCH: Calm. Normal affect.    The results of significant diagnostics from this hospitalization (including imaging, microbiology, ancillary and laboratory) are listed below for reference.     Microbiology: Recent Results (from the past 240 hour(s))  Respiratory Panel by RT PCR (Flu A&B, Covid) - Nasopharyngeal Swab     Status: None   Collection Time: 06/06/19  9:40 PM   Specimen: Nasopharyngeal Swab  Result Value Ref Range Status   SARS Coronavirus 2 by RT PCR NEGATIVE NEGATIVE Final    Comment: (NOTE) SARS-CoV-2 target nucleic acids are NOT DETECTED. The SARS-CoV-2 RNA is generally detectable in upper respiratoy specimens during the acute phase of infection. The lowest concentration of SARS-CoV-2 viral copies this assay can detect is 131 copies/mL. A negative result does not preclude SARS-Cov-2 infection and should not be used as the sole basis for treatment or other patient management decisions. A negative result may occur with  improper specimen collection/handling, submission of specimen other than nasopharyngeal swab, presence of viral mutation(s) within the areas targeted by this assay, and inadequate number of viral copies (<131 copies/mL). A negative result must be combined with clinical observations, patient history, and epidemiological information. The expected result is Negative. Fact Sheet for Patients:  PinkCheek.be Fact Sheet for Healthcare Providers:  GravelBags.it This test is not yet ap proved or cleared by the [place] FDA  and  has been authorized for detection and/or diagnosis of SARS-CoV-2 by FDA under an Emergency Use Authorization (EUA). This EUA will remain  in effect (meaning this test can be used) for the duration of the COVID-19 declaration under Section 564(b)(1) of the Act, 21 U.S.C. section 360bbb-3(b)(1), unless the authorization is terminated or revoked sooner.    Influenza A by PCR NEGATIVE NEGATIVE Final   Influenza B by PCR NEGATIVE NEGATIVE Final    Comment: (NOTE) The Xpert Xpress SARS-CoV-2/FLU/RSV assay is intended as an aid in  the diagnosis of influenza from Nasopharyngeal swab specimens and  should not be used as a sole basis for treatment. Nasal washings and  aspirates are unacceptable for Xpert Xpress SARS-CoV-2/FLU/RSV  testing. Fact Sheet for Patients: PinkCheek.be Fact Sheet for Healthcare Providers: GravelBags.it This test is not yet approved or cleared by the [place] FDA and  has been authorized for detection and/or diagnosis of SARS-CoV-2 by  FDA under an Emergency Use Authorization (EUA). This EUA will remain  in effect (meaning this test can be used) for the duration of the  Covid-19 declaration under Section 564(b)(1) of the Act, 21  U.S.C. section 360bbb-3(b)(1), unless the authorization is  terminated or revoked. Performed at [facility], [place] [address] [place], [place] [address]   Surgical pcr screen     Status: None   Collection Time: 06/07/19 12:45 PM   Specimen: Nasal Mucosa; Nasal Swab  Result Value Ref Range Status   MRSA, PCR NEGATIVE NEGATIVE Final   Staphylococcus aureus NEGATIVE NEGATIVE Final    Comment: (NOTE) The Xpert SA Assay (FDA approved for NASAL specimens in patients 74 years of age and older), is one component of a comprehensive surveillance program. It is not intended to diagnose infection nor to guide or monitor treatment. Performed at [facility], [place] [address], [place],  [address]      Labs: BNP (last 3 results) No results for input(s): BNP in the last 8760 hours. Basic Metabolic Panel: Recent Labs  Lab 06/06/19 2102 06/08/19 0306 06/09/19 0918 06/11/19 0341  NA 138 136 137  137  K 3.6 3.7 4.1 3.9  CL 98 101 104 104  CO2 29 25 24 24   GLUCOSE 137* 145* 99 106*  BUN 26* 24* 23 27*  CREATININE 0.77 0.86 0.79 0.74  CALCIUM 9.4 8.3* 7.9* 7.6*  MG  --  1.6* 2.1 2.0  PHOS  --   --   --  3.3   Liver Function Tests: Recent Labs  Lab 06/06/19 2102 06/08/19 0306 06/11/19 0341  AST 22 19  --   ALT 12 10  --   ALKPHOS 115 71  --   BILITOT 0.4 0.9  --   PROT 8.2* 5.9*  --   ALBUMIN 4.4 2.8* 2.3*   No results for input(s): LIPASE, AMYLASE in the last 168 hours. No results for input(s): AMMONIA in the last 168 hours. CBC: Recent Labs  Lab 06/06/19 2102 06/06/19 2102 06/07/19 [id number] 06/07/19 0625 06/08/19 0306 06/08/19 0306 06/09/19 0918 06/10/19 0245 06/11/19 0341 06/12/19 0254 06/13/19 0317  WBC 7.6  --  7.4  --  9.6  --   --  7.0 6.3  --   --   NEUTROABS 5.8  --  5.9  --   --   --   --   --   --   --   --   HGB 14.0   < > 13.2   < > 12.5   < > 10.8* 9.5* 9.1* 9.3* 9.7*  HCT 44.1   < > 40.7   < > 38.9   < > 34.1* 29.6* 28.8* 28.4* 30.7*  MCV 89.1  --  87.9  --  88.0  --   --  89.7 90.0  --   --   PLT 205  --  219  --  200  --   --  149* 157  --   --    < > = values in this interval not displayed.   Cardiac Enzymes: No results for input(s): CKTOTAL, CKMB, CKMBINDEX, TROPONINI in the last 168 hours. BNP: Invalid input(s): POCBNP CBG: No results for input(s): GLUCAP in the last 168 hours. D-Dimer No results for input(s): DDIMER in the last 72 hours. Hgb A1c No results for input(s): HGBA1C in the last 72 hours. Lipid Profile No results for input(s): CHOL, HDL, LDLCALC, TRIG, CHOLHDL, LDLDIRECT in the last 72 hours. Thyroid function studies No results for input(s): TSH, T4TOTAL, T3FREE, THYROIDAB  in the last 72 hours.  Invalid input(s): FREET3 Anemia work up No results for input(s): VITAMINB12, FOLATE, FERRITIN, TIBC, IRON, RETICCTPCT in the last 72 hours. Urinalysis    Component Value Date/Time   COLORURINE YELLOW 01/30/2019 [place] 01/30/2019 1443   LABSPEC 1.012 01/30/2019 1443   PHURINE 6.0 01/30/2019 1443   GLUCOSEU NEGATIVE 01/30/2019 1443   GLUCOSEU NEGATIVE 01/16/2018 1300   HGBUR NEGATIVE 01/30/2019 [place] 01/30/2019 1443   BILIRUBINUR Negative 12/19/2017 [place] 01/30/2019 1443   PROTEINUR NEGATIVE 01/30/2019 1443   UROBILINOGEN 0.2 01/16/2018 1300   NITRITE NEGATIVE 01/30/2019 1443   LEUKOCYTESUR NEGATIVE 01/30/2019 1443   Sepsis Labs Invalid input(s): PROCALCITONIN,  WBC,  LACTICIDVEN   Time coordinating discharge: 35 minutes  SIGNED:  Mercy Riding, MD  [facility] 06/13/2019, 8:18 AM  If 7PM-7AM, please contact night-coverage [email or web address] Password [email or web address]

## 2019-06-13 NOTE — Telephone Encounter (Signed)
Per son message   Dr. Yong Channel, my mother , Glenda [name], 1925/01/08, missed a check up with you recently. She needed to update her prescriptions so she can continue to receive them at [facility].  She needs to continue her hydrocodone and the rest of her meds. She was admitted to the hospital last Wednesday for an incarcerated hernia and had surgery Thursday. Hopefully she will go back to [facility] this week. Could you re prescribe her meds until she can get well enough to get an appointment to see you? Thanks,  [name] [birth year]

## 2019-06-18 ENCOUNTER — Telehealth: Payer: Self-pay | Admitting: Family Medicine

## 2019-06-18 NOTE — Telephone Encounter (Signed)
Paperwork has been received and has been placed for Dr. Yong Channel to review.

## 2019-06-18 NOTE — Telephone Encounter (Signed)
Has called to schedule patient for a 1 week follow up with Dr. Yong Channel after hernia surgery.  I have scheduled patient for 4/5. Also, has sent communications over informing Dr. Yong Channel of patient going to the ED. Along with changes to her RX.  Would like to know if Dr. Yong Channel has received these faxes and if they could be sent back as soon as possible.

## 2019-06-25 ENCOUNTER — Encounter: Payer: Self-pay | Admitting: Family Medicine

## 2019-06-25 ENCOUNTER — Other Ambulatory Visit: Payer: Self-pay

## 2019-06-25 ENCOUNTER — Ambulatory Visit (INDEPENDENT_AMBULATORY_CARE_PROVIDER_SITE_OTHER): Payer: Medicare Other | Admitting: Family Medicine

## 2019-06-25 VITALS — BP 126/72 | HR 54 | Temp 97.9°F | Ht 61.0 in | Wt 112.0 lb

## 2019-06-25 DIAGNOSIS — D649 Anemia, unspecified: Secondary | ICD-10-CM

## 2019-06-25 DIAGNOSIS — K436 Other and unspecified ventral hernia with obstruction, without gangrene: Secondary | ICD-10-CM

## 2019-06-25 DIAGNOSIS — I5032 Chronic diastolic (congestive) heart failure: Secondary | ICD-10-CM

## 2019-06-25 DIAGNOSIS — G894 Chronic pain syndrome: Secondary | ICD-10-CM | POA: Diagnosis not present

## 2019-06-25 DIAGNOSIS — Z79899 Other long term (current) drug therapy: Secondary | ICD-10-CM

## 2019-06-25 DIAGNOSIS — I10 Essential (primary) hypertension: Secondary | ICD-10-CM | POA: Diagnosis not present

## 2019-06-25 DIAGNOSIS — C801 Malignant (primary) neoplasm, unspecified: Secondary | ICD-10-CM

## 2019-06-25 DIAGNOSIS — I483 Typical atrial flutter: Secondary | ICD-10-CM

## 2019-06-25 LAB — CBC WITH DIFFERENTIAL/PLATELET
Basophils Absolute: 0 10*3/uL (ref 0.0–0.1)
Basophils Relative: 0.8 % (ref 0.0–3.0)
Eosinophils Absolute: 0.1 10*3/uL (ref 0.0–0.7)
Eosinophils Relative: 1.5 % (ref 0.0–5.0)
HCT: 35.3 % — ABNORMAL LOW (ref 36.0–46.0)
Hemoglobin: 11.9 g/dL — ABNORMAL LOW (ref 12.0–15.0)
Lymphocytes Relative: 19.4 % (ref 12.0–46.0)
Lymphs Abs: 1.1 10*3/uL (ref 0.7–4.0)
MCHC: 33.6 g/dL (ref 30.0–36.0)
MCV: 88.7 fl (ref 78.0–100.0)
Monocytes Absolute: 0.5 10*3/uL (ref 0.1–1.0)
Monocytes Relative: 8.3 % (ref 3.0–12.0)
Neutro Abs: 3.8 10*3/uL (ref 1.4–7.7)
Neutrophils Relative %: 70 % (ref 43.0–77.0)
Platelets: 346 10*3/uL (ref 150.0–400.0)
RBC: 3.99 Mil/uL (ref 3.87–5.11)
RDW: 15.3 % (ref 11.5–15.5)
WBC: 5.5 10*3/uL (ref 4.0–10.5)

## 2019-06-25 LAB — COMPREHENSIVE METABOLIC PANEL
ALT: 7 U/L (ref 0–35)
AST: 17 U/L (ref 0–37)
Albumin: 3.8 g/dL (ref 3.5–5.2)
Alkaline Phosphatase: 103 U/L (ref 39–117)
BUN: 24 mg/dL — ABNORMAL HIGH (ref 6–23)
CO2: 32 mEq/L (ref 19–32)
Calcium: 9.2 mg/dL (ref 8.4–10.5)
Chloride: 98 mEq/L (ref 96–112)
Creatinine, Ser: 0.69 mg/dL (ref 0.40–1.20)
GFR: 79.48 mL/min (ref >60.00)
Glucose, Bld: 115 mg/dL — ABNORMAL HIGH (ref 70–99)
Potassium: 4.5 mEq/L (ref 3.5–5.1)
Sodium: 136 mEq/L (ref 135–145)
Total Bilirubin: 0.5 mg/dL (ref 0.2–1.2)
Total Protein: 6.9 g/dL (ref 6.0–8.3)

## 2019-06-25 MED ORDER — SERTRALINE HCL 25 MG PO TABS: 25.0000 mg | ORAL_TABLET | Freq: Every day | ORAL | 3 refills | Status: DC

## 2019-06-25 MED ORDER — HYDROCODONE-ACETAMINOPHEN 5-325 MG PO TABS: 1.0000 | ORAL_TABLET | Freq: Two times a day (BID) | ORAL | 0 refills | Status: DC

## 2019-06-25 NOTE — Patient Instructions (Addendum)
No changes in meds today.   I am thrilled you are doing so well all things considered after your surgery  Please stop by lab before you go If you do not have mychart- we will call you about results within 5 business days of [name] receiving them.  If you have mychart- we will send your results within 3 business days of [name] receiving them.  If abnormal or we want to clarify a result, we will call or mychart you to make sure you receive the message.  If you have questions or concerns or don't hear within 5 business days, please send [name] a message or call us.   Recommended follow up: Return in about 3 months (around 09/24/2019) for follow up- or sooner if needed.

## 2019-06-25 NOTE — Progress Notes (Signed)
Phone [phone] In person visit   Subjective:   Glenda [name] is a 86 y.o. year old very pleasant female patient who presents for/with See problem oriented charting Chief Complaint  Patient presents with  . Hernia    had repare on 06/07/2019   This visit occurred during the SARS-CoV-2 public health emergency.  Safety protocols were in place, including screening questions prior to the visit, additional usage of staff PPE, and extensive cleaning of exam room while observing appropriate contact time as indicated for disinfecting solutions.   Past Medical History-  Patient Active Problem List   Diagnosis Date Noted  . Incarcerated ventral hernia 06/06/2019    Priority: High  . DNR (do not resuscitate) 06/06/2019    Priority: High  . Chronic pain syndrome 02/09/2018    Priority: High  . Chronic narcotic use 08/11/2017    Priority: High  . Malignant neoplasm of upper-outer quadrant of left breast in female, estrogen receptor positive ([place]) 06/27/2017    Priority: High  . Atrial flutter ([place])     Priority: High  . Chronic diastolic HF (heart failure) ([place]) 07/11/2012    Priority: High  . Osteoarthritis 04/03/2012    Priority: High  . Bradycardia 02/20/2019    Priority: Medium  . Anxiety 08/08/2018    Priority: Medium  . Shortness of breath 12/13/2014    Priority: Medium  . Essential hypertension 03/25/2011    Priority: Medium  . Insomnia 09/15/2010    Priority: Medium  . Status post total replacement of right hip 02/05/2019    Priority: Low  . Osteoarthritis of right hip 02/01/2019    Priority: Low  . History of cardioversion 08/20/2016    Priority: Low  . Chronic anticoagulation-Eliquis 02/04/2016    Priority: Low  . Palpitations 12/13/2014    Priority: Low  . Constipation 11/06/2012    Priority: Low  . Accelerated/malignant hypertension 09/14/2012    Priority: Low  . Anemia 06/10/2011    Priority: Low    Medications- reviewed and updated Current  Outpatient Medications  Medication Sig Dispense Refill  . anastrozole (ARIMIDEX) 1 MG tablet Take 1 tablet (1 mg total) by mouth daily. 90 tablet 3  . apixaban (ELIQUIS) 2.5 MG TABS tablet Take 1 tablet (2.5 mg total) by mouth 2 (two) times daily. 60 tablet 4  . docusate sodium (COLACE) 100 MG capsule Take 100 mg by mouth 2 (two) times daily.    . furosemide (LASIX) 20 MG tablet Take 1.5 tablets (30 mg total) by mouth daily. 45 tablet 3  . Multiple Vitamins-Minerals (CEROVITE ADVANCED FORMULA PO) Take 1 tablet by mouth daily. (0800)    . omeprazole (PRILOSEC) 40 MG capsule Take 40 mg by mouth daily.    . ondansetron (ZOFRAN) 4 MG tablet Take 4 mg by mouth every 6 (six) hours as needed for nausea.    . polyethylene glycol (MIRALAX / GLYCOLAX) packet Take 17 g by mouth See admin instructions. Mix 17 grams in 8 ounces of juice/water and drink daily, Monday through Saturday    . temazepam (RESTORIL) 7.5 MG capsule Take 1 capsule (7.5 mg total) by mouth at bedtime as needed for sleep. 30 capsule 0  . Cholecalciferol 1000 units tablet Take 1,000 Units by mouth daily.    . Cranberry 425 MG CAPS Take 425 mg by mouth 2 (two) times daily.    Marland Kitchen HYDROcodone-acetaminophen (NORCO/VICODIN) 5-325 MG tablet Take 1 tablet by mouth 2 (two) times daily. May refill 1 month. At 9 Am  and 3 PM. Separate by 6 hours from temazepam. 60 tablet 0  . HYDROcodone-acetaminophen (NORCO/VICODIN) 5-325 MG tablet Take 1 tablet by mouth 2 (two) times daily. 1 tablet At 9 AM and 3 pm. separate by 6 hours from temazepam. May refill in 2 months. 60 tablet 0  . HYDROcodone-acetaminophen (NORCO/VICODIN) 5-325 MG tablet Take 1 tablet by mouth 2 (two) times daily. May refill today At 9 Am and 3 PM. Separate by 6 hours from temazepam. 60 tablet 0  . sertraline (ZOLOFT) 25 MG tablet Take 1 tablet (25 mg total) by mouth daily. 90 tablet 3   No current facility-administered medications for this visit.     Objective:  BP 126/72   Pulse (!)  54   Temp 97.9 F (36.6 C) (Temporal)   Ht 5\' 1"  (1.549 m)   Wt 112 lb (50.8 kg)   SpO2 97%   BMI 21.16 kg/m  Gen: NAD, resting comfortably [id number] irregular no murmurs rubs or gallops Lungs: CTAB no crackles, wheeze, rhonchi Abdomen: soft/nontender/nondistended/normal bowel sounds. Midline below umbilicus surgical scar healing well- some slight induration at some edges Ext: no edema Skin: warm, dry    Assessment and Plan     # Hernia Repair  S: Patient presented to the hospital with abdominal pain and nausea for 2 days-ultimately admitted for incarcerated ventral hernia on CT abdomen pelvis and required surgery which was done on 06/07/2019.  Patient had open reduction and repair of obstructing incisional hernia with overlay mesh with lysis of adhesions by Dr. Kieth Brightly.  Patient did experience some urinary retention and drop in hemoglobin postoperatively.  Urinary retention resolved without need for ongoing medication.  Hemoglobin stabilized and remained stable even after Eliquis was resumed for atrial flutter. hgb got as low as 9.1 but improved to 9.7 before discharge. Stool cards were negative.   PT/OT recommended home health at her facility.  He also recommended outpatient follow-up with primary care. A/P: healig well post hernia. Update cbc/cmp- did have some anemia hoping stabilized  #Atrial flutter S: Patient resumed Eliquis after surgery.  She is not on beta-blocker due to bradycardia A/P:  Stable. Continue current medications.    #Hypertension/diastolic chf #Anxiety S: Held after surgery-losartan 100 mg, amlodipine 5 mg (gets edema even on nonfull dose), Lasix 30 mg-she is compliant with this. Trial low dose zoloft for anxiety portion early 2020 and this has been helpful  A/P: blood pressure doing well on lasix alone.  This is also controlling chf. Continue current meds -monitor home BP daily for 2 weeks and send me results then once weekly - anxiety controlled on  zoloft and helping BP- continue current meds   #Osteoarthritis/insomnia S: Patient with continued pain particularly in her low back and now some in abdomen.   Pain worsens with walking-hydrocodone lessens pain some.  She seems to do okay at rest as long as hydrocodone is available.  She uses hydrocodone twice a day and spaces last dose from 6 hours from temazepam to decrease fall risk.  Temazepam remains helpful  for sleep issues/insomnia- we tried to change to 7.5mg  dose but was very expensive so have opted to continue 15mg  dose. Her facility has trazodone listed as allergy so we have opted out. Also hospital tried to reduce to 7.5 mg but facility continued prior dose- she has not had delirium issus there so we will continue A/P:  OA reasonable control with hydrocodone twice daily- also helping incision pain/abdominal pain- continue current meds -insomnia continue temazepam at  15mg  given no more delirium issues  -Last UDS May 2019. Update today.  - [place] tried to review but CPU wouldn't load  -Controlled substance contract May 2019 - I did take tizanidine off her med list as she said she was not taking   #breast cancer- has close oncology follow up- continue arimidex. Appears stable  Recommended follow up: Return in about 3 months (around 09/24/2019) for follow up- or sooner if needed. Future Appointments  Date Time Provider [place]  06/28/2019 11:45 AM Deberah Pelton, NP [facility]  10/15/2019 11:00 AM Nicholas Lose, MD [facility] None   Lab/Order associations:   ICD-10-CM   1. Incarcerated ventral hernia  K43.6   2. Essential hypertension  I10 CBC with Differential/Platelet    Comprehensive metabolic panel  3. Chronic pain syndrome  G89.4 Pain Mgmt, Profile 8 w/Conf, U  4. Chronic diastolic HF (heart failure) (HCC)  I50.32   5. Typical atrial flutter (HCC)  I48.3   6. Anemia, unspecified type  D64.9   7. High risk medication use  Z79.899 Pain Mgmt, Profile 8 w/Conf,  U    Meds ordered this encounter  Medications  . sertraline (ZOLOFT) 25 MG tablet    Sig: Take 1 tablet (25 mg total) by mouth daily.    Dispense:  90 tablet    Refill:  3  . HYDROcodone-acetaminophen (NORCO/VICODIN) 5-325 MG tablet    Sig: Take 1 tablet by mouth 2 (two) times daily. May refill 1 month. At 9 Am and 3 PM. Separate by 6 hours from temazepam.    Dispense:  60 tablet    Refill:  0  . HYDROcodone-acetaminophen (NORCO/VICODIN) 5-325 MG tablet    Sig: Take 1 tablet by mouth 2 (two) times daily. 1 tablet At 9 AM and 3 pm. separate by 6 hours from temazepam. May refill in 2 months.    Dispense:  60 tablet    Refill:  0  . HYDROcodone-acetaminophen (NORCO/VICODIN) 5-325 MG tablet    Sig: Take 1 tablet by mouth 2 (two) times daily. May refill today At 9 Am and 3 PM. Separate by 6 hours from temazepam.    Dispense:  60 tablet    Refill:  0   Return precautions advised.  Garret Reddish, MD

## 2019-06-27 LAB — PAIN MGMT, PROFILE 8 W/CONF, U
6 Acetylmorphine: NEGATIVE ng/mL
Alcohol Metabolites: NEGATIVE ng/mL (ref <500)
Alphahydroxyalprazolam: NEGATIVE ng/mL
Alphahydroxymidazolam: NEGATIVE ng/mL
Alphahydroxytriazolam: NEGATIVE ng/mL
Aminoclonazepam: NEGATIVE ng/mL
Amphetamines: NEGATIVE ng/mL
Benzodiazepines: POSITIVE ng/mL
Buprenorphine, Urine: NEGATIVE ng/mL
Cocaine Metabolite: NEGATIVE ng/mL
Codeine: NEGATIVE ng/mL
Creatinine: 46.5 mg/dL
Hydrocodone: 779 ng/mL
Hydromorphone: NEGATIVE ng/mL
Hydroxyethylflurazepam: NEGATIVE ng/mL
Lorazepam: NEGATIVE ng/mL
MDMA: NEGATIVE ng/mL
Marijuana Metabolite: NEGATIVE ng/mL
Morphine: NEGATIVE ng/mL
Nordiazepam: NEGATIVE ng/mL
Norhydrocodone: 988 ng/mL
Opiates: POSITIVE ng/mL
Oxazepam: 712 ng/mL
Oxidant: NEGATIVE ug/mL
Oxycodone: NEGATIVE ng/mL
Temazepam: >8000 ng/mL
pH: 5.6 (ref 4.5–9.0)

## 2019-06-28 ENCOUNTER — Other Ambulatory Visit: Payer: Self-pay

## 2019-06-28 ENCOUNTER — Encounter: Payer: Self-pay | Admitting: General Practice

## 2019-06-28 ENCOUNTER — Ambulatory Visit (INDEPENDENT_AMBULATORY_CARE_PROVIDER_SITE_OTHER): Payer: Medicare Other | Admitting: General Practice

## 2019-06-28 VITALS — BP 116/70 | HR 56 | Ht 60.0 in | Wt 109.0 lb

## 2019-06-28 DIAGNOSIS — I3139 Other pericardial effusion (noninflammatory): Secondary | ICD-10-CM

## 2019-06-28 DIAGNOSIS — E7800 Pure hypercholesterolemia, unspecified: Secondary | ICD-10-CM | POA: Diagnosis not present

## 2019-06-28 DIAGNOSIS — I4892 Unspecified atrial flutter: Secondary | ICD-10-CM | POA: Diagnosis not present

## 2019-06-28 DIAGNOSIS — I10 Essential (primary) hypertension: Secondary | ICD-10-CM

## 2019-06-28 DIAGNOSIS — R002 Palpitations: Secondary | ICD-10-CM

## 2019-06-28 DIAGNOSIS — I5032 Chronic diastolic (congestive) heart failure: Secondary | ICD-10-CM

## 2019-06-28 DIAGNOSIS — I313 Pericardial effusion (noninflammatory): Secondary | ICD-10-CM

## 2019-06-28 NOTE — Progress Notes (Signed)
Cardiology Clinic Note   Patient Name: Glenda [name] Date of Encounter: 06/28/2019  Primary Care Provider:  Marin Olp, [name] Primary Cardiologist:  Glenda Ruths, [name]  Patient Profile    Glenda [name] 86 year old female presents today for follow-up evaluation of her essential hypertension, chronic diastolic CHF, atrial flutter and bradycardia.  Past Medical History    Past Medical History:  Diagnosis Date  . Aortic stenosis    Echo 02/2011 showing mild AS with normal LV systolic function  . Arthritis    "lower back" (10/09/2015)  . Atrial flutter with rapid ventricular response (Glenda [name]) 08/19/2016   s/p successful DCCV on 08/20/16, continue eliquis  . Chronic bronchitis ([place])    "off and on; several years" (10/09/2015)  . Chronic diastolic CHF (congestive heart failure) ([place])    a. 12/2015 Echo: EF 55-60%, mild AI/MR, mildly dil LA.  Marland Kitchen Chronic lower back pain   . Complication of anesthesia    "they had trouble waking me up after colon resection"  . Confusion    Originally listed as Glenda [name]-pt denies this hx on 10/09/2015 "it was the [place] I was taking; they had thought I was having a st originally roke"  . DDD (degenerative disc disease), lumbar    severe facet dz and adv DDD MRI L spine 2009  . Diverticulosis   . Dysrhythmia    A-Fib  . Femur fracture, right ([place]) 10/18/2018  . GERD (gastroesophageal reflux disease)   . Hiatal hernia    hx  . Hypercholesterolemia   . Hypertension   . Insomnia   . OA (osteoarthritis) of knee   . Paroxysmal atrial flutter ([place])    a. 09/2015 s/p TEE/DCCV;  b. CHA2DS2VASc = 5-->Eliquis.  . Peripheral edema   . PVCs (premature ventricular contractions)   . Sacral fracture ([place]) 11/06/2012  . Vertigo 11/19/2012   Past Surgical History:  Procedure Laterality Date  . BREAST BIOPSY Left   . CARDIAC CATHETERIZATION  02/17/2001   MILD REGURGITATION. EF 60%  . CARDIOVERSION N/A 10/08/2015   Procedure: CARDIOVERSION;  Surgeon: Glenda Klein, [name];  Location: [facility];  Service: Cardiovascular;  Laterality: N/A;  . CARDIOVERSION N/A 08/20/2016   Procedure: Cardioversion;  Surgeon: Glenda Lance, [name];  Location: [facility];  Service: Cardiovascular;  Laterality: N/A;  . CATARACT EXTRACTION W/ INTRAOCULAR LENS  IMPLANT, BILATERAL Bilateral   . CESAREAN SECTION  1954  . COLECTOMY     related to "blockage"  . COLONOSCOPY    . EXCISIONAL HEMORRHOIDECTOMY    . HIP PINNING,CANNULATED Right 10/19/2018   Procedure: Right percutaneous hip pinning;  Surgeon: Glenda Crocker, [name];  Location: [facility];  Service: Orthopedics;  Laterality: Right;  . INCISIONAL HERNIA REPAIR N/A 06/07/2019   Procedure: Reduction and Repair of Incisional Hernia with Overlay Mesh;  Surgeon: Glenda [name];  Location: [facility];  Service: General;  Laterality: N/A;  . INGUINAL HERNIA REPAIR Bilateral   . KYPHOPLASTY Right 09/11/2012   Procedure: Right Acrylic Sacroplasty;  Surgeon: Glenda Miss, [name];  Location: [facility];  Service: Neurosurgery;  Laterality: Right;  Right  Acrylic Sacroplasty  . TEE WITHOUT CARDIOVERSION N/A 10/08/2015   Procedure: TRANSESOPHAGEAL ECHOCARDIOGRAM (TEE);  Surgeon: Glenda Klein, [name];  Location: [facility];  Service: Cardiovascular;  Laterality: N/A;  . TOTAL HIP ARTHROPLASTY Right 02/05/2019   Procedure: [place] TOTAL HIP ARTHROPLASTY ANTERIOR APPROACH AND REMOVAL OF SCREWS;  Surgeon: Glenda Pear, [name];  Location: [facility];  Service: Orthopedics;  Laterality: Right;  . TOTAL KNEE ARTHROPLASTY Right   . TOTAL MASTECTOMY Left 07/22/2017   Procedure: TOTAL MASTECTOMY;  Surgeon: Glenda Seltzer, [name];  Location: [facility];  Service: General;  Laterality: Left;  . UMBILICAL HERNIA REPAIR      Allergies  Allergies  Allergen Reactions  . Ace Inhibitors Other (See Comments)    Cough  . Halcion [Triazolam]     UNSPECIFIED REACTION   . Pentazocine Lactate     UNSPECIFIED REACTION   . Sulfa Drugs Cross Reactors      UNSPECIFIED REACTION   . Trazodone And Nefazodone Other (See Comments)    Per MAR  . Amitriptyline Other (See Comments)    Hyperactivity  . Avelox [Moxifloxacin Hcl In Nacl] Other (See Comments)    dizziness  . Penicillins Rash    History of Present Illness    Glenda [name] is a past medical history of essential hypertension, chronic diastolic CHF, atrial flutter, bradycardia, anemia, constipation, shortness of breath, palpitations, history of DCCV, anxiety, incarcerated ventral hernia, and DNR.  She had an exercise stress test on 11/12 which showed no evidence of ischemia.  She was admitted 717 with atrial flutter versus atrial tachycardia and underwent a TEE guided DCCV.  She had recurrent atrial flutter 5/18 and underwent repeat DCCV.  An appointment was scheduled to see EP for consideration of ablation.  However she elected not to have the procedure.  A cardiac monitor 3/19 showed sinus with PVCs and PACs but no atrial flutter.  Echocardiogram 11/20 showed normal LV function, mild left atrial enlargement, mild to moderate right atrial enlargement, moderate pericardial effusion, mild mitral regurgitation.  Dr. Stanford [name] I reviewed the study and felt the pericardial effusion was small.  She then had recurrent atrial flutter following her hip replacement 01/2019.    She was last seen by Dr. Stanford [name] on 04/30/2019.  During that time she was noted to have periods of dyspnea with physical activities which was unchanged.  She had no PND, orthopnea pedal edema, chest pain or syncope.  She was recently admitted to the hospital for incarcerated ventral hernia.  She was admitted on 06/07/2019 and discharged on 06/13/2019.  She presents to the emergency department with abdominal pain and nausea.  Her CT showed ventral hernia with a segment of small bowel and associated mild or early obstruction.  She was taken to surgery and hernia was repaired with mesh.  She experienced urinary retention and a drop in her  hemoglobin postoperatively.  Her urinary retention resolved without need for ongoing medication and her hemoglobin stabilized.  Hemoglobin was 9.7 before discharge occult stool was negative.  She followed up with her PCP on 06/25/2019 and was found to be healing well postoperatively he ordered CBC/CMP.  Hemoglobin 06/25/2019 is11.9.  Her Eliquis was resumed after surgery.  She presents to the clinic today and states she was at her PCP office 3 days ago.  She states she is slowly increasing her physical activity but is having some trouble due to her right hip.  She also has noticed some firm places on her abdomen that she is concerned about.  She has been compliant with her Eliquis and states that it was resumed following her abdominal surgery.  She went on to say that she had a follow-up appointment with her surgeon this afternoon.  Today she denies chest pain, shortness of breath, lower extremity edema, fatigue, palpitations, melena, hematuria, hemoptysis, diaphoresis, weakness, presyncope, syncope, orthopnea, and PND.  Home Medications  Prior to Admission medications   Medication Sig Start Date End Date Taking? Authorizing Provider  anastrozole (ARIMIDEX) 1 MG tablet Take 1 tablet (1 mg total) by mouth daily. 10/10/18   Nicholas Lose, [name]  apixaban (ELIQUIS) 2.5 MG TABS tablet Take 1 tablet (2.5 mg total) by mouth 2 (two) times daily. 07/24/17   Greer Pickerel, [name]  Cholecalciferol 1000 units tablet Take 1,000 Units by mouth daily.    Provider, Historical, [name]  Cranberry 425 MG CAPS Take 425 mg by mouth 2 (two) times daily.    Provider, Historical, [name]  docusate sodium (COLACE) 100 MG capsule Take 100 mg by mouth 2 (two) times daily.    Provider, Historical, [name]  furosemide (LASIX) 20 MG tablet Take 1.5 tablets (30 mg total) by mouth daily. 08/26/16   Baldwin Jamaica, PA-C  HYDROcodone-acetaminophen (NORCO/VICODIN) 5-325 MG tablet Take 1 tablet by mouth 2 (two) times daily. May refill 1 month. At 9 Am and 3  PM. Separate by 6 hours from temazepam. 06/25/19   Glenda Olp, [name]  HYDROcodone-acetaminophen (NORCO/VICODIN) 5-325 MG tablet Take 1 tablet by mouth 2 (two) times daily. 1 tablet At 9 AM and 3 pm. separate by 6 hours from temazepam. May refill in 2 months. 06/25/19   Glenda Olp, [name]  HYDROcodone-acetaminophen (NORCO/VICODIN) 5-325 MG tablet Take 1 tablet by mouth 2 (two) times daily. May refill today At 18 Am and 3 PM. Separate by 6 hours from temazepam. 06/25/19   Glenda Olp, [name]  Multiple Vitamins-Minerals (CEROVITE ADVANCED FORMULA PO) Take 1 tablet by mouth daily. (0800)    Provider, Historical, [name]  omeprazole (PRILOSEC) 40 MG capsule Take 40 mg by mouth daily. 08/30/17   Provider, Historical, [name]  ondansetron (ZOFRAN) 4 MG tablet Take 4 mg by mouth every 6 (six) hours as needed for nausea.    Provider, Historical, [name]  polyethylene glycol (MIRALAX / GLYCOLAX) packet Take 17 g by mouth See admin instructions. Mix 17 grams in 8 ounces of juice/water and drink daily, Monday through Saturday    Provider, Historical, [name]  sertraline (ZOLOFT) 25 MG tablet Take 1 tablet (25 mg total) by mouth daily. 06/25/19   Glenda Olp, [name]  temazepam (RESTORIL) 7.5 MG capsule Take 1 capsule (7.5 mg total) by mouth at bedtime as needed for sleep. 06/13/19   Mercy Riding, [name]    Family History    Family History  Problem Relation Age of Onset  . Heart disease Mother   . Hypertension Father   . Other Sister        39 in 2019  . Other Sister        14 in 2019  . Heart disease Sister   . Heart attack Neg Hx   . Stroke Neg Hx    She indicated that her mother is deceased. She indicated that her father is deceased. She indicated that two of her three sisters are alive. She indicated that her maternal grandmother is deceased. She indicated that her maternal grandfather is deceased. She indicated that her paternal grandmother is deceased. She indicated that her paternal grandfather is deceased. She indicated  that the status of her neg hx is unknown.  Social History    Social History   Socioeconomic History  . Marital status: Widowed    Spouse name: Not on file  . Number of children: Not on file  . Years of education: Not on file  . Highest education level: Not on file  Occupational History  . Not on file  Tobacco Use  . Smoking status: Never Smoker  . Smokeless tobacco: Never Used  Substance and Sexual Activity  . Alcohol use: No  . Drug use: No  . Sexual activity: Never  Other Topics Concern  . 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 Difficulty of Paying Living Expenses:   Food Insecurity:   . Worried About Charity fundraiser in the Last Year:   . Arboriculturist in the Last Year:   Transportation Needs:   . Film/video editor (Medical):   Marland Kitchen Lack of Transportation (Non-Medical):   Physical Activity:   . Days of Exercise per Week:   . Minutes of Exercise per Session:   Stress:   . Feeling of Stress :   Social Connections:   . Frequency of Communication with Friends and Family:   . Frequency of Social Gatherings with Friends and Family:   . Attends Religious Services:   . Active Member of Clubs or Organizations:   . Attends Archivist Meetings:   Marland Kitchen Marital Status:   Intimate Partner Violence:   . Fear of Current or Ex-Partner:   . Emotionally Abused:   Marland Kitchen Physically Abused:   . Sexually Abused:      Review of Systems    General:  No chills, fever, night sweats or weight changes.  Cardiovascular:  No chest pain, dyspnea on exertion, edema, orthopnea,  palpitations, paroxysmal nocturnal dyspnea. Dermatological: No rash, lesions/masses Respiratory: No cough, dyspnea Urologic: No hematuria, dysuria Abdominal:   No nausea, vomiting, diarrhea, bright red blood per rectum, melena, or hematemesis Neurologic:  No visual changes, wkns, changes in mental status. All other systems reviewed and are otherwise negative except as noted above.  Physical Exam    VS:  BP 116/70 (BP Location: Right Arm, Patient Position: Sitting, Cuff Size: Normal)   Pulse (!) 56   Ht 5' (1.524 m)   Wt 109 lb (49.4 kg)   SpO2 97%   BMI 21.29 kg/m  , BMI Body mass index is 21.29 kg/m. GEN: Well nourished, well developed, in no acute distress. HEENT: normal. Neck: Supple, no JVD, carotid bruits, or masses. Cardiac: RRR, no murmurs, rubs, or gallops. No clubbing, cyanosis, edema.  Radials/DP/PT 2+ and equal bilaterally.  Respiratory:  Respirations regular and unlabored, clear to auscultation bilaterally. GI: Soft, nontender, nondistended, BS + x 4. MS: no deformity or atrophy. Skin: warm and dry, no rash.  Abdominal incision well approximated.  Clean dry no drainage. Neuro:  Strength and sensation are intact. Psych: Normal affect.  Accessory Clinical Findings    ECG personally reviewed by me today-none today.  EKG 06/06/2019 Atrial fibrillation with LVH repolarization abnormalities possible ischemia artifact no acute changes from prior reading 67 bpm  Echocardiogram 05/15/2019 IMPRESSIONS    1. Left ventricular ejection fraction, by estimation, is 60 to 65%. The  left ventricle has normal function. The left ventricle has no regional  wall motion abnormalities. There  is mild asymmetric left ventricular  hypertrophy of the septal segment. Left  ventricular diastolic parameters are indeterminate.  2. Right ventricular systolic function is normal. The right ventricular  size is normal. There is normal pulmonary artery systolic pressure.  3. Left atrial size  was mildly dilated.  4. The mitral valve is normal in structure and function. Mild mitral  valve regurgitation. No evidence of mitral stenosis.  5. The aortic valve is tricuspid. Aortic valve regurgitation is mild. No  aortic stenosis is present.  6. The inferior vena cava is normal in size with greater than 50%  respiratory variability, suggesting right atrial pressure of 3 mmHg.  7. Side by side comparisons were done with echo 01/2019 showing no  significant changes.     Assessment & Plan   1.  Atrial flutter-history of atrial fibrillation and atrial flutter.  Heart rate today 56.  Hemoglobin stable after hernia repair 11.9 on 06/25/2019.  Creatinine 0.69 Continue apixaban 2.5 mg twice daily Avoid triggers caffeine, chocolate, EtOH, etc. Heart healthy low-sodium diet Increase physical activity as tolerated  Essential hypertension-blood pressure today 116/70.  Not currently on antihypertensive medication. Heart healthy low-sodium diet-salty 6 given Increase physical activity as tolerated  Palpitations-previous cardiac monitor showed PACs and PVCs.  Beta-blocker stopped due to bradycardia.  No recent episodes of palpitations. Continue to monitor  Chronic diastolic congestive heart failure-euvolemic today.  CMP stable on 06/25/2019. Continue furosemide 30 mg daily Heart healthy low-sodium diet Daily weights  Hyperlipidemia-LDL 100 on 08/20/2016 Heart healthy low-sodium high-fiber diet Increase physical activity as tolerated  Moderate pericardial effusion-follow-up echocardiogram on 05/15/2019 showed trivial pericardial effusion. No need to repeat/further testing at this time. Continue to monitor  Disposition: Follow-up with Dr. Stanford [name] in 3 months.  Jossie Ng. [facility] [address] [address] Office [phone] Fax [phone]

## 2019-06-28 NOTE — Patient Instructions (Signed)
Medication Instructions:  The current medical regimen is effective;  continue present plan and medications as directed. Please refer to the Current Medication list given to you today. *If you need a refill on your cardiac medications before your next appointment, please call your pharmacy*   Follow-Up: Your next appointment:  3 month(s) Please call our office 2 months in advance to schedule this appointment In Person with Kirk Ruths, MD  At [facility], you and your health needs are our priority.  As part of our continuing mission to provide you with exceptional heart care, we have created designated Provider Care Teams.  These Care Teams include your primary Cardiologist (physician) and Advanced Practice Providers (APPs -  Physician Assistants and Nurse Practitioners) who all work together to provide you with the care you need, when you need it.  We recommend signing up for the patient portal called "MyChart".  Sign up information is provided on this After Visit Summary.  MyChart is used to connect with patients for Virtual Visits (Telemedicine).  Patients are able to view lab/test results, encounter notes, upcoming appointments, etc.  Non-urgent messages can be sent to your provider as well.   To learn more about what you can do with MyChart, go to [email or web address].

## 2019-07-27 ENCOUNTER — Other Ambulatory Visit: Payer: Self-pay | Admitting: Family Medicine

## 2019-07-27 NOTE — Telephone Encounter (Signed)
Pharmacy requesting rx refill for Temazepam 15mg  capsule  LOV: 06/25/2019 Next Visit: 10/04/2019  Last refill: 06/27/2019  Approve?

## 2019-07-30 ENCOUNTER — Telehealth: Payer: Self-pay | Admitting: Family Medicine

## 2019-07-30 NOTE — Telephone Encounter (Signed)
No answer on home number.  I called the cell number which went to her son, [name].  He gave me the number to call her, which is the home number, but said that she may be at lunch.

## 2019-09-05 ENCOUNTER — Other Ambulatory Visit: Payer: Self-pay | Admitting: Internal Medicine

## 2019-09-05 MED ORDER — AMLODIPINE BESYLATE 2.5 MG PO TABS: 2.5000 mg | ORAL_TABLET | Freq: Every day | ORAL | 3 refills | Status: DC

## 2019-09-06 ENCOUNTER — Telehealth: Payer: Self-pay | Admitting: Family Medicine

## 2019-09-06 NOTE — Telephone Encounter (Signed)
Initial Comment caller is calling for [name] and she has elevated BP, needs to be advised on how to proceed, [facility] Additional Comment sent to paging Patient Name Glenda [name] Patient DOB Sep 21, 1924 Requesting Provider [place] Physician Number [phone] Facility Name [facility] Room Number na Disp. Time Disposition Final User 09/05/2019 5:25:22 PM Send to [place], [place] 09/05/2019 5:35:12 PM Called On-Call Provider Electa Sniff 09/05/2019 5:35:30 PM Page Completed Yes Gerrit Heck DoctorName Phone DateTime Result/Outcome Message Type Notes Deborra Medina - MD [phone] 09/05/2019 5:35:12 PM Called On Call Provider - Reached Doctor Paged Deborra Medina - MD 09/05/2019 5:35:19 PM Spoke with On Call - General Message Result Call Closed By: Electa Sniff Transaction Date/Time: 09/05/2019 5:22:13 PM (ET

## 2019-09-06 NOTE — Telephone Encounter (Signed)
Called assisted living and was transferred to a voicemail, vm was left for [place] to call the office back.

## 2019-09-21 ENCOUNTER — Telehealth: Payer: Self-pay | Admitting: Cardiology

## 2019-09-21 NOTE — Telephone Encounter (Signed)
Pt c/o BP issue: STAT if pt c/o blurred vision, one-sided weakness or slurred speech  1. What are your last 5 BP readings?  09/21/19 199/95 AM (Electronic Cuff) 202/76 (Manual Cuff) 179/81  2. Are you having any other symptoms (ex. Dizziness, headache, blurred vision, passed out)? Felt like heart was fluttering & had headache last night   3. What is your BP issue? Pt has been experiencing hypertension this morning, it only dropped to 179/81 after morning meds.

## 2019-09-21 NOTE — Telephone Encounter (Signed)
Returned call to [employer] with [facility] concerning patient's HTN - readings today: 199/95 AM (Electronic Cuff) 202/76 (Manual Cuff) 179/81 (after meds). Amber reports patient started amlodipine 2.5mg  on 6/17 for HTN - added by Dr. Yong Channel. Patient has c/o off/on headache but not painful enough for her to ask for meds. She has no acute pain issues or other underlying complaints that may contribute to high BP. Of note, BP was normal during last cardiology OV.   Per note, patient also c/o heart fluttering (has h/o a-flutter). Amber reports recent HR readings of 75, 60, 78.  Will send to MD to advise if amlodipine dose should be increased or another med added for HTN.

## 2019-09-21 NOTE — Telephone Encounter (Signed)
Increase amlodipine to 5 mg daily and follow BP Glenda [name]

## 2019-09-25 NOTE — Telephone Encounter (Signed)
Left detailed message on [facility] voicemail of medication changes. They are call to confirm.

## 2019-09-28 MED ORDER — AMLODIPINE BESYLATE 5 MG PO TABS: 5.0000 mg | ORAL_TABLET | Freq: Every day | ORAL | 3 refills | Status: DC

## 2019-09-28 NOTE — Telephone Encounter (Signed)
Spoke with Glenda [name] at [facility], will fax this note to [facility] and [facility] to discontinue the amlodipine 2.5 mg daily and start amlodipine 5 mg once daily. New script sent to [facility]

## 2019-10-04 ENCOUNTER — Other Ambulatory Visit: Payer: Self-pay | Admitting: Hematology and Oncology

## 2019-10-04 ENCOUNTER — Other Ambulatory Visit: Payer: Self-pay | Admitting: *Deleted

## 2019-10-04 ENCOUNTER — Encounter: Payer: Self-pay | Admitting: Family Medicine

## 2019-10-04 ENCOUNTER — Other Ambulatory Visit: Payer: Self-pay

## 2019-10-04 ENCOUNTER — Ambulatory Visit (INDEPENDENT_AMBULATORY_CARE_PROVIDER_SITE_OTHER): Payer: Medicare Other | Admitting: Family Medicine

## 2019-10-04 VITALS — BP 127/72 | HR 72 | Temp 98.2°F | Ht 60.0 in | Wt 114.0 lb

## 2019-10-04 DIAGNOSIS — M159 Polyosteoarthritis, unspecified: Secondary | ICD-10-CM

## 2019-10-04 DIAGNOSIS — G4700 Insomnia, unspecified: Secondary | ICD-10-CM

## 2019-10-04 DIAGNOSIS — I483 Typical atrial flutter: Secondary | ICD-10-CM | POA: Diagnosis not present

## 2019-10-04 DIAGNOSIS — G894 Chronic pain syndrome: Secondary | ICD-10-CM

## 2019-10-04 DIAGNOSIS — Z170 Estrogen receptor positive status [ER+]: Secondary | ICD-10-CM

## 2019-10-04 DIAGNOSIS — F419 Anxiety disorder, unspecified: Secondary | ICD-10-CM

## 2019-10-04 DIAGNOSIS — I10 Essential (primary) hypertension: Secondary | ICD-10-CM

## 2019-10-04 DIAGNOSIS — Z1231 Encounter for screening mammogram for malignant neoplasm of breast: Secondary | ICD-10-CM

## 2019-10-04 DIAGNOSIS — I5032 Chronic diastolic (congestive) heart failure: Secondary | ICD-10-CM | POA: Diagnosis not present

## 2019-10-04 DIAGNOSIS — M85852 Other specified disorders of bone density and structure, left thigh: Secondary | ICD-10-CM

## 2019-10-04 DIAGNOSIS — C50412 Malignant neoplasm of upper-outer quadrant of left female breast: Secondary | ICD-10-CM

## 2019-10-04 MED ORDER — HYDROCODONE-ACETAMINOPHEN 5-325 MG PO TABS: 1.0000 | ORAL_TABLET | Freq: Two times a day (BID) | ORAL | 0 refills | Status: DC

## 2019-10-04 MED ORDER — CHOLECALCIFEROL 25 MCG (1000 UT) PO TABS: 1000.0000 [IU] | ORAL_TABLET | Freq: Every day | ORAL | 3 refills | Status: DC

## 2019-10-04 MED ORDER — TEMAZEPAM 7.5 MG PO CAPS: 7.5000 mg | ORAL_CAPSULE | Freq: Every evening | ORAL | 5 refills | Status: DC | PRN

## 2019-10-04 NOTE — Progress Notes (Signed)
[facility] Note  10/09/2019     CHIEF COMPLAINT Patient presents for Retina Evaluation   HISTORY OF PRESENT ILLNESS: Glenda [name] is a 86 y.o. female who presents to the clinic today for:   HPI    Retina Evaluation    In both eyes.  Duration of years.  I, the attending physician,  performed the HPI with the patient and updated documentation appropriately.          Comments    Patient had an exam 04/12/2019 (encounter is in chart review) Dr. Cordelia Pen.  She was told she has AMD OU.  She tried to take capsules for her eyes but it made her sick. She has not had any treatment for this issue.  She had seen a retina doctor in the past that dx her with MD.  Faces are looking more distorted now OS>OD x several months.   Fam hx glaucoma- Mother       Last edited by Bernarda Caffey, MD on 10/10/2019 11:36 PM. (History)    pt saw Dr. Gerarda Fraction back in January (in [place]), she states he told her she has macular degeneration, pt states [facility] did not tell her she needed any treatments or any drops, pt states she tried taking AREDS 2 (preservision), but they make her nauseous, pt states Dr. Cordelia Pen told her glasses would not help her vision, pt used to see Dr. Silvestre Gunner in 2014, pt states she sees Dr. Marica Otter for routine eye exams, pt feels like her vision has decreased since her exam in January, she says faces are distorted, she denies being diabetic  Referring physician: Marica Otter, OD [address],  [place] [address]  HISTORICAL INFORMATION:   Selected notes from the MEDICAL RECORD NUMBER Referred for ret eval OU   CURRENT MEDICATIONS: No current outpatient medications on file. (Ophthalmic Drugs)   No current facility-administered medications for this visit. (Ophthalmic Drugs)   Current Outpatient Medications (Other)  Medication Sig  . amLODipine (NORVASC) 5 MG tablet Take 1 tablet (5 mg total) by mouth daily.  Marland Kitchen anastrozole  (ARIMIDEX) 1 MG tablet Take 1 tablet (1 mg total) by mouth daily.  Marland Kitchen apixaban (ELIQUIS) 2.5 MG TABS tablet Take 1 tablet (2.5 mg total) by mouth 2 (two) times daily.  . Cholecalciferol 25 MCG (1000 UT) tablet Take 1 tablet (1,000 Units total) by mouth daily.  . Cranberry 425 MG CAPS Take 425 mg by mouth 2 (two) times daily.  Marland Kitchen docusate sodium (COLACE) 100 MG capsule Take 100 mg by mouth 2 (two) times daily.  . furosemide (LASIX) 20 MG tablet Take 1.5 tablets (30 mg total) by mouth daily.  Marland Kitchen HYDROcodone-acetaminophen (NORCO/VICODIN) 5-325 MG tablet Take 1 tablet by mouth 2 (two) times daily. May refill 1 month. At 9 Am and 3 PM. Separate by 6 hours from temazepam.  . HYDROcodone-acetaminophen (NORCO/VICODIN) 5-325 MG tablet Take 1 tablet by mouth 2 (two) times daily. 1 tablet At 9 AM and 3 pm. separate by 6 hours from temazepam. May refill in 2 months.  Marland Kitchen HYDROcodone-acetaminophen (NORCO/VICODIN) 5-325 MG tablet Take 1 tablet by mouth 2 (two) times daily. May refill today At 9 Am and 3 PM. Separate by 6 hours from temazepam.  . Multiple Vitamins-Minerals (CEROVITE ADVANCED FORMULA PO) Take 1 tablet by mouth daily. (0800)  . omeprazole (PRILOSEC) 40 MG capsule Take 40 mg by mouth daily.  . ondansetron (ZOFRAN) 4 MG tablet Take 4  mg by mouth every 6 (six) hours as needed for nausea.  . polyethylene glycol (MIRALAX / GLYCOLAX) packet Take 17 g by mouth See admin instructions. Mix 17 grams in 8 ounces of juice/water and drink daily, Monday through Saturday  . sertraline (ZOLOFT) 25 MG tablet Take 1 tablet (25 mg total) by mouth daily.  . temazepam (RESTORIL) 15 MG capsule TAKE (1) CAPSULE BY MOUTH AT BEDTIME. SHOULD SEPARATE FROM HYDROCODONE BY AT LEAST 6 HOURS.  Marland Kitchen temazepam (RESTORIL) 7.5 MG capsule Take 1 capsule (7.5 mg total) by mouth at bedtime as needed for sleep.   No current facility-administered medications for this visit. (Other)      REVIEW OF SYSTEMS: ROS    Positive for:  Musculoskeletal, HENT, Cardiovascular, Eyes, Psychiatric   Negative for: Constitutional, Gastrointestinal, Neurological, Skin, Genitourinary, Endocrine, Respiratory, Allergic/Imm, Heme/Lymph   Last edited by Leonie Douglas, COA on 10/09/2019  1:35 PM. (History)       ALLERGIES Allergies  Allergen Reactions  . Ace Inhibitors Other (See Comments)    Cough  . Halcion [Triazolam]     UNSPECIFIED REACTION   . Pentazocine Lactate     UNSPECIFIED REACTION   . Sulfa Drugs Cross Reactors     UNSPECIFIED REACTION   . Trazodone And Nefazodone Other (See Comments)    Per MAR  . Amitriptyline Other (See Comments)    Hyperactivity  . Avelox [Moxifloxacin Hcl In Nacl] Other (See Comments)    dizziness  . Penicillins Rash    PAST MEDICAL HISTORY Past Medical History:  Diagnosis Date  . Aortic stenosis    Echo 02/2011 showing mild AS with normal LV systolic function  . Arthritis    "lower back" (10/09/2015)  . Atrial flutter with rapid ventricular response ([place]) 08/19/2016   s/p successful DCCV on 08/20/16, continue eliquis  . Chronic bronchitis ([place])    "off and on; several years" (10/09/2015)  . Chronic diastolic CHF (congestive heart failure) ([place])    a. 12/2015 Echo: EF 55-60%, mild AI/MR, mildly dil LA.  Marland Kitchen Chronic lower back pain   . Complication of anesthesia    "they had trouble waking me up after colon resection"  . Confusion    Originally listed as TIA-pt denies this hx on 10/09/2015 "it was the [place] I was taking; they had thought I was having a st originally roke"  . DDD (degenerative disc disease), lumbar    severe facet dz and adv DDD MRI L spine 2009  . Diverticulosis   . Dysrhythmia    A-Fib  . Femur fracture, right ([place]) 10/18/2018  . GERD (gastroesophageal reflux disease)   . Hiatal hernia    hx  . Hypercholesterolemia   . Hypertension   . Insomnia   . OA (osteoarthritis) of knee   . Paroxysmal atrial flutter ([place])    a. 09/2015 s/p TEE/DCCV;  b. CHA2DS2VASc =  5-->Eliquis.  . Peripheral edema   . PVCs (premature ventricular contractions)   . Sacral fracture ([place]) 11/06/2012  . Vertigo 11/19/2012   Past Surgical History:  Procedure Laterality Date  . BREAST BIOPSY Left   . CARDIAC CATHETERIZATION  02/17/2001   MILD REGURGITATION. EF 60%  . CARDIOVERSION N/A 10/08/2015   Procedure: CARDIOVERSION;  Surgeon: Sanda Klein, MD;  Location: [facility];  Service: Cardiovascular;  Laterality: N/A;  . CARDIOVERSION N/A 08/20/2016   Procedure: Cardioversion;  Surgeon: Evans Lance, MD;  Location: [facility];  Service: Cardiovascular;  Laterality: N/A;  . CATARACT EXTRACTION  W/ INTRAOCULAR LENS  IMPLANT, BILATERAL Bilateral   . CESAREAN SECTION  1954  . COLECTOMY     related to "blockage"  . COLONOSCOPY    . EXCISIONAL HEMORRHOIDECTOMY    . HIP PINNING,CANNULATED Right 10/19/2018   Procedure: Right percutaneous hip pinning;  Surgeon: Erle Crocker, MD;  Location: [facility];  Service: Orthopedics;  Laterality: Right;  . INCISIONAL HERNIA REPAIR N/A 06/07/2019   Procedure: Reduction and Repair of Incisional Hernia with Overlay Mesh;  Surgeon: Kinsinger, Arta Bruce, MD;  Location: [facility];  Service: General;  Laterality: N/A;  . INGUINAL HERNIA REPAIR Bilateral   . KYPHOPLASTY Right 09/11/2012   Procedure: Right Acrylic Sacroplasty;  Surgeon: Kristeen Miss, MD;  Location: [facility];  Service: Neurosurgery;  Laterality: Right;  Right  Acrylic Sacroplasty  . TEE WITHOUT CARDIOVERSION N/A 10/08/2015   Procedure: TRANSESOPHAGEAL ECHOCARDIOGRAM (TEE);  Surgeon: Sanda Klein, MD;  Location: [facility];  Service: Cardiovascular;  Laterality: N/A;  . TOTAL HIP ARTHROPLASTY Right 02/05/2019   Procedure: [place] TOTAL HIP ARTHROPLASTY ANTERIOR APPROACH AND REMOVAL OF SCREWS;  Surgeon: Frederik Pear, MD;  Location: [facility];  Service: Orthopedics;  Laterality: Right;  . TOTAL KNEE ARTHROPLASTY Right   . TOTAL MASTECTOMY Left 07/22/2017   Procedure: TOTAL  MASTECTOMY;  Surgeon: Excell Seltzer, MD;  Location: [facility];  Service: General;  Laterality: Left;  . UMBILICAL HERNIA REPAIR      FAMILY HISTORY Family History  Problem Relation Age of Onset  . Heart disease Mother   . Hypertension Father   . Other Sister        64 in 2019  . Other Sister        68 in 2019  . Heart disease Sister   . Heart attack Neg Hx   . Stroke Neg Hx     SOCIAL HISTORY Social History   Tobacco Use  . Smoking status: Never Smoker  . Smokeless tobacco: Never Used  Vaping Use  . Vaping Use: Never used  Substance Use Topics  . Alcohol use: No  . Drug use: No         OPHTHALMIC EXAM:  Base Eye Exam    Visual Acuity (Snellen - Linear)      Right Left   Dist cc 20/25 +2 20/100 -2   Dist ph cc NI 20/100 +2   Correction: Glasses       Tonometry (Tonopen, 1:52 PM)      Right Left   Pressure 15 16       Pupils      Dark Light Shape React APD   Right 2 1 Round Brisk None   Left 2 1 Round Brisk None       Visual Fields (Counting fingers)      Left Right    Full Full       Extraocular Movement      Right Left    Full Full       Neuro/Psych    Oriented x3: Yes   Mood/Affect: Normal       Dilation    Both eyes: 1.0% Mydriacyl, 2.5% Phenylephrine @ 1:52 PM        Slit Lamp and Fundus Exam    Slit Lamp Exam      Right Left   Lids/Lashes Dermatochalasis - upper lid, mild Meibomian gland dysfunction Dermatochalasis - upper lid, mild Meibomian gland dysfunction   Conjunctiva/Sclera White and quiet White and quiet   Cornea arcus, well healed  temporal cataract wounds, trace Punctate epithelial erosions arcus, well healed temporal cataract wounds, 1+Punctate epithelial erosions   Anterior Chamber Deep and quiet Deep and quiet   Iris Round and dilated Round and dilated   Lens 3 piece Posterior chamber intraocular lens, anterior capsular phimosis 3 piece Posterior chamber intraocular lens, mild anterior capsular phimosis, open PC    Vitreous Vitreous syneresis Vitreous syneresis, Posterior vitreous detachment       Fundus Exam      Right Left   Disc mild Pallor, Sharp rim mild Pallor, Sharp rim, temporal PPA   C/D Ratio 0.2 0.1   Macula Blunted foveal reflex, Drusen, RPE mottling, clumping and atrophy, punctate IRH superior macula Blunted foveal reflex, Drusen, RPE mottling, clumping and atrophy, +PED, no heme   Vessels Vascular attenuation, mild tortuousity Vascular attenuation, mild tortuousity   Periphery Attached, scattered peripheral drusen, mild reticular degeneration, No heme  Attached           Refraction    Wearing Rx      Sphere Cylinder Axis Add   Right [place] +0.50 051 +3.00   Left -1.50 +1.00 107 +3.00       Manifest Refraction      Sphere Cylinder Axis Dist VA   Right [place] +0.25 050 NI   Left -2.00 +1.00 105 NI          IMAGING AND PROCEDURES  Imaging and Procedures for 10/09/2019  OCT, Retina - OU - Both Eyes       Right Eye Quality was good. Central Foveal Thickness: 291. Progression has no prior data. Findings include normal foveal contour, no IRF, retinal drusen , no SRF (Shallow PED).   Left Eye Quality was good. Central Foveal Thickness: 293. Progression has no prior data. Findings include normal foveal contour, no IRF, retinal drusen , no SRF, outer retinal atrophy, pigment epithelial detachment.   Notes *Images captured and stored on drive  Diagnosis / Impression:  NFP; no IRF/SRF OU +drusen/PED OU Nonexudative ARMD OU   Clinical management:  See below  Abbreviations: NFP - Normal foveal profile. CME - cystoid macular edema. PED - pigment epithelial detachment. IRF - intraretinal fluid. SRF - subretinal fluid. EZ - ellipsoid zone. ERM - epiretinal membrane. ORA - outer retinal atrophy. ORT - outer retinal tubulation. SRHM - subretinal hyper-reflective material. IRHM - intraretinal hyper-reflective material                 ASSESSMENT/PLAN:    ICD-10-CM   1.  Intermediate stage nonexudative age-related macular degeneration of both eyes  H35.3132   2. Retinal edema  H35.81 OCT, Retina - OU - Both Eyes  3. Essential hypertension  I10   4. Hypertensive retinopathy of both eyes  H35.033   5. Pseudophakia, both eyes  Z96.1     1,2. Age related macular degeneration, non-exudative, OU (OS > OD)  - early RPE atrophy OS  - The incidence, anatomy, and pathology of dry AMD, risk of progression, and the AREDS and AREDS 2 studies including smoking risks discussed with patient.  - Recommend amsler grid monitoring  - f/u 6 weeks, DFE, OCT  3,4. Hypertensive retinopathy OU - discussed importance of tight BP control - monitor  5. Pseudophakia OU  - s/p CE/IOL OU  - IOL in good position, doing well  - monitor   Ophthalmic Meds Ordered this visit:  No orders of the defined types were placed in this encounter.      Return in about 6  weeks (around 11/20/2019) for f/u non-exu ARMD OU, DFE, OCT.  There are no Patient Instructions on file for this visit.   Explained the diagnoses, plan, and follow up with the patient and they expressed understanding.  Patient expressed understanding of the importance of proper follow up care.  This document serves as a record of services personally performed by Gardiner Sleeper, MD, PhD. It was created on their behalf by Estill Bakes, COT an ophthalmic technician. The creation of this record is the provider's dictation and/or activities during the visit.    Electronically signed by: Estill Bakes, COT 10/04/19 @ 11:47 PM   This document serves as a record of services personally performed by Gardiner Sleeper, MD, PhD. It was created on their behalf by San Jetty. Owens Shark, OA an ophthalmic technician. The creation of this record is the provider's dictation and/or activities during the visit.    Electronically signed by: San Jetty. Owens Shark, [place] 07.20.2021 11:47 PM   Gardiner Sleeper, M.D., Ph.D. Diseases & Surgery of the Retina  and Vitreous [facility]  I have reviewed the above documentation for accuracy and completeness, and I agree with the above. Gardiner Sleeper, M.D., Ph.D. 10/10/19 11:47 PM   Abbreviations: M myopia (nearsighted); A astigmatism; H hyperopia (farsighted); P presbyopia; Mrx spectacle prescription;  CTL contact lenses; OD right eye; OS left eye; OU both eyes  XT exotropia; ET esotropia; PEK punctate epithelial keratitis; PEE punctate epithelial erosions; DES dry eye syndrome; MGD meibomian gland dysfunction; ATs artificial tears; PFAT's preservative free artificial tears; [place] nuclear sclerotic cataract; PSC posterior subcapsular cataract; ERM epi-retinal membrane; PVD posterior vitreous detachment; RD retinal detachment; DM diabetes mellitus; DR diabetic retinopathy; NPDR non-proliferative diabetic retinopathy; PDR proliferative diabetic retinopathy; CSME clinically significant macular edema; DME diabetic macular edema; dbh dot blot hemorrhages; CWS cotton wool spot; POAG primary open angle glaucoma; C/D cup-to-disc ratio; HVF humphrey visual field; GVF goldmann visual field; OCT optical coherence tomography; IOP intraocular pressure; BRVO Branch retinal vein occlusion; CRVO central retinal vein occlusion; CRAO central retinal artery occlusion; BRAO branch retinal artery occlusion; RT retinal tear; SB scleral buckle; PPV pars plana vitrectomy; VH Vitreous hemorrhage; PRP panretinal laser photocoagulation; IVK intravitreal kenalog; VMT vitreomacular traction; MH Macular hole;  NVD neovascularization of the disc; NVE neovascularization elsewhere; AREDS age related eye disease study; ARMD age related macular degeneration; POAG primary open angle glaucoma; EBMD epithelial/anterior basement membrane dystrophy; ACIOL anterior chamber intraocular lens; IOL intraocular lens; PCIOL posterior chamber intraocular lens; Phaco/IOL phacoemulsification with intraocular lens placement; [place] photorefractive  keratectomy; LASIK laser assisted in situ keratomileusis; HTN hypertension; DM diabetes mellitus; COPD chronic obstructive pulmonary disease

## 2019-10-04 NOTE — Progress Notes (Signed)
Phone [phone] In person visit   Subjective:   Glenda [name] is a 86 y.o. year old very pleasant female patient who presents for/with See problem oriented charting Chief Complaint  Patient presents with  . Follow-up    This visit occurred during the SARS-CoV-2 public health emergency.  Safety protocols were in place, including screening questions prior to the visit, additional usage of staff PPE, and extensive cleaning of exam room while observing appropriate contact time as indicated for disinfecting solutions.   Past Medical History-  Patient Active Problem List   Diagnosis Date Noted  . Incarcerated ventral hernia 06/06/2019    Priority: High  . DNR (do not resuscitate) 06/06/2019    Priority: High  . Chronic pain syndrome 02/09/2018    Priority: High  . Chronic narcotic use 08/11/2017    Priority: High  . Malignant neoplasm of upper-outer quadrant of left breast in female, estrogen receptor positive ([place]) 06/27/2017    Priority: High  . Atrial flutter ([place])     Priority: High  . Chronic diastolic HF (heart failure) ([place]) 07/11/2012    Priority: High  . Osteoarthritis 04/03/2012    Priority: High  . Bradycardia 02/20/2019    Priority: Medium  . Anxiety 08/08/2018    Priority: Medium  . Shortness of breath 12/13/2014    Priority: Medium  . Essential hypertension 03/25/2011    Priority: Medium  . Insomnia 09/15/2010    Priority: Medium  . Status post total replacement of right hip 02/05/2019    Priority: Low  . Osteoarthritis of right hip 02/01/2019    Priority: Low  . History of cardioversion 08/20/2016    Priority: Low  . Chronic anticoagulation-Eliquis 02/04/2016    Priority: Low  . Palpitations 12/13/2014    Priority: Low  . Constipation 11/06/2012    Priority: Low  . Accelerated/malignant hypertension 09/14/2012    Priority: Low  . Anemia 06/10/2011    Priority: Low    Medications- reviewed and updated Current Outpatient Medications    Medication Sig Dispense Refill  . amLODipine (NORVASC) 5 MG tablet Take 1 tablet (5 mg total) by mouth daily. 90 tablet 3  . anastrozole (ARIMIDEX) 1 MG tablet Take 1 tablet (1 mg total) by mouth daily. 90 tablet 3  . apixaban (ELIQUIS) 2.5 MG TABS tablet Take 1 tablet (2.5 mg total) by mouth 2 (two) times daily. 60 tablet 4  . Cholecalciferol 1000 units tablet Take 1,000 Units by mouth daily.    . Cranberry 425 MG CAPS Take 425 mg by mouth 2 (two) times daily.    Marland Kitchen docusate sodium (COLACE) 100 MG capsule Take 100 mg by mouth 2 (two) times daily.    . furosemide (LASIX) 20 MG tablet Take 1.5 tablets (30 mg total) by mouth daily. 45 tablet 3  . HYDROcodone-acetaminophen (NORCO/VICODIN) 5-325 MG tablet Take 1 tablet by mouth 2 (two) times daily. May refill 1 month. At 9 Am and 3 PM. Separate by 6 hours from temazepam. 60 tablet 0  . HYDROcodone-acetaminophen (NORCO/VICODIN) 5-325 MG tablet Take 1 tablet by mouth 2 (two) times daily. 1 tablet At 9 AM and 3 pm. separate by 6 hours from temazepam. May refill in 2 months. 60 tablet 0  . HYDROcodone-acetaminophen (NORCO/VICODIN) 5-325 MG tablet Take 1 tablet by mouth 2 (two) times daily. May refill today At 9 Am and 3 PM. Separate by 6 hours from temazepam. 60 tablet 0  . Multiple Vitamins-Minerals (CEROVITE ADVANCED FORMULA PO) Take 1 tablet by  mouth daily. (0800)    . omeprazole (PRILOSEC) 40 MG capsule Take 40 mg by mouth daily.    . ondansetron (ZOFRAN) 4 MG tablet Take 4 mg by mouth every 6 (six) hours as needed for nausea.    . polyethylene glycol (MIRALAX / GLYCOLAX) packet Take 17 g by mouth See admin instructions. Mix 17 grams in 8 ounces of juice/water and drink daily, Monday through Saturday    . sertraline (ZOLOFT) 25 MG tablet Take 1 tablet (25 mg total) by mouth daily. 90 tablet 3  . temazepam (RESTORIL) 15 MG capsule TAKE (1) CAPSULE BY MOUTH AT BEDTIME. SHOULD SEPARATE FROM HYDROCODONE BY AT LEAST 6 HOURS. 30 capsule 5  . temazepam  (RESTORIL) 7.5 MG capsule Take 1 capsule (7.5 mg total) by mouth at bedtime as needed for sleep. 30 capsule 0   No current facility-administered medications for this visit.     Objective:  BP 127/72   Pulse 72   Temp 98.2 F (36.8 C) (Temporal)   Ht 5' (1.524 m)   Wt 114 lb (51.7 kg)   SpO2 98%   BMI 22.26 kg/m  Gen: NAD, resting comfortably CV: RRR no murmurs rubs or gallops Lungs: CTAB no crackles, wheeze, rhonchi Ext: no edema Skin: warm, dry Neuro: wheelchair bound        #Hypertension #Anxiety-low-dose Zoloft 25 mg has actually been helpful for blood pressure. S: Compliant with  amlodipine 5 mg (gets edema even on non max dose), Lasix 30 mg. Trial low dose zoloft for anxiety portion early 2020  Has been helpful  losartan 100 mg- held after surgery  March 2021 and has not been restarted  Anxiety doing well also BP Readings from Last 3 Encounters:  10/04/19 127/72  06/28/19 116/70  06/25/19 126/72  A/P: Stable x2. Continue current medications.     #Osteoarthritis/insomnia S: Patient with continued pain particularly in her low back.  Also some finger pain.  Pain worsens with walking-hydrocodone lessens pain some.  She seems to do okay at rest as long as hydrocodone is available.  She uses hydrocodone twice a day and states helping pain and spaces last dose from 6 hours from temazepam to decrease fall risk.  Temazepam remains helpful  for sleep issues/insomnia- we tried to change to 7.5mg  dose but was very expensive so have opted to continue 15mg  dose. Her facility has trazodone listed as allergy so we have opted out A/P: reasonable control- continue current meds  -Last UDS May 2019 - NCCSRS /PDMP reviewed today  -Controlled substance contract May 2019  #Chronic diastolic heart failure S: Compliant with Lasix 30 mg.  Hesitant to increase amlodipine due to heart failure.  Edema-none.  Weight-largely stable  A/P: Stable. Continue current medications.    #Atrial  flutter S:Patient compliant with Eliquis 2.5mg  twice daily.  Cardioversion 2017 and appears to stay in normal sinus rhythm. No rate control medicine due to bradycardia A/P: Stable. Continue current medications.    #osteopenia- we discussed updating this when safe from covid 19 - she agrees to tdo this now - will start vitamin d 1000 units (not on her med list but she states may be taking OTC)- likely check next bloodwork  % Breast cancer-continues to follow with oncology on arimidex  Appears like actinic keratosis on left ear- tender if lays on it- has appointment with dermatology soon  Recommended follow up:  3 month follow up  Future Appointments  Date Time Provider [place]  10/09/2019  1:15 PM  Bernarda Caffey, MD [facility] None  10/15/2019  2:50 PM [facility] MM 3 [facility]  10/22/2019  1:45 PM Nicholas Lose, MD [facility] None    Lab/Order associations: No diagnosis found.  No orders of the defined types were placed in this encounter.  Return precautions advised.  Garret Reddish, MD

## 2019-10-04 NOTE — Patient Instructions (Addendum)
Health Maintenance Due  Topic Date Due  . COVID-19 Vaccine (1) WILL CALL [facility] TO GET DATES.  Never done   Schedule your bone density test at check out desk. You may also call directly to X-ray at [phone] to schedule an appointment that is convenient for you.  - located [address] across the street from [place] - in the basement - you do need an appointment for the bone density tests.   Follow up with dermatology- I think this could be a precancer on the ear.    Recommended follow up: Return in about 3 months (around 01/04/2020) for follow up- or sooner if needed.

## 2019-10-08 ENCOUNTER — Telehealth: Payer: Self-pay

## 2019-10-08 NOTE — Progress Notes (Signed)
This is taken from the osteoporosis section of the AVS.. "Temazepam remains helpful  for sleep issues/insomnia- we tried to change to 7.5mg  dose but was very expensive so have opted to continue 15mg  dose. Her facility has trazodone listed as allergy so we have opted out"

## 2019-10-08 NOTE — Telephone Encounter (Signed)
Nurse from [facility] called and wanted to check if the changes to Pt.s temazepam was a mistake. The orders received at [facility] were temazepam (RESTORIL) 7.5 MG capsule.   However, if you Ellissa Ayo the AVS it stated that Dr. Yong Channel was going to stick with temazepam (RESTORIL) 15 MG capsule for the patient. If this is the case, she needs this prescription sent in. Please Advise

## 2019-10-08 NOTE — Telephone Encounter (Signed)
Can one of yall look for the form that I sent to scan on Friday so that I can see what it has? I don't see anything on the AVS.

## 2019-10-09 ENCOUNTER — Other Ambulatory Visit: Payer: Self-pay

## 2019-10-09 ENCOUNTER — Ambulatory Visit (INDEPENDENT_AMBULATORY_CARE_PROVIDER_SITE_OTHER): Payer: Medicare Other | Admitting: Ophthalmology

## 2019-10-09 DIAGNOSIS — H353132 Nonexudative age-related macular degeneration, bilateral, intermediate dry stage: Secondary | ICD-10-CM | POA: Diagnosis not present

## 2019-10-09 DIAGNOSIS — Z961 Presence of intraocular lens: Secondary | ICD-10-CM

## 2019-10-09 DIAGNOSIS — H3581 Retinal edema: Secondary | ICD-10-CM | POA: Diagnosis not present

## 2019-10-09 DIAGNOSIS — I10 Essential (primary) hypertension: Secondary | ICD-10-CM | POA: Diagnosis not present

## 2019-10-09 DIAGNOSIS — H35033 Hypertensive retinopathy, bilateral: Secondary | ICD-10-CM

## 2019-10-09 NOTE — Telephone Encounter (Signed)
This is what I was referring to in the AVS. Its very possible I Glenda [name] it incorrectly.    #Osteoarthritis/insomnia S: Patient with continued pain particularly in her low back.  Also some finger pain.  Pain worsens with walking-hydrocodone lessens pain some.  She seems to do okay at rest as long as hydrocodone is available.  She uses hydrocodone twice a day and states helping pain and spaces last dose from 6 hours from temazepam to decrease fall risk.  Temazepam remains helpful  for sleep issues/insomnia- we tried to change to 7.5mg  dose but was very expensive so have opted to continue 15mg  dose. Her facility has trazodone listed as allergy so we have opted out A/P: reasonable control- continue current meds  -Last UDS May 2019 - NCCSRS /PDMP reviewed today  -Controlled substance contract May 2019

## 2019-10-10 ENCOUNTER — Encounter (INDEPENDENT_AMBULATORY_CARE_PROVIDER_SITE_OTHER): Payer: Self-pay | Admitting: Ophthalmology

## 2019-10-10 ENCOUNTER — Ambulatory Visit: Payer: Medicare Other | Admitting: Hematology and Oncology

## 2019-10-10 NOTE — Telephone Encounter (Signed)
Do you mind taking a look at this for me? Not understanding.

## 2019-10-10 NOTE — Telephone Encounter (Signed)
We need clarification from [facility].

## 2019-10-10 NOTE — Telephone Encounter (Signed)
I'm not sure. Sorry!

## 2019-10-11 ENCOUNTER — Other Ambulatory Visit: Payer: Self-pay | Admitting: Family Medicine

## 2019-10-13 NOTE — Telephone Encounter (Signed)
Please advise 

## 2019-10-13 NOTE — Telephone Encounter (Signed)
Will not let me decline script she had refilled this month not needed.

## 2019-10-15 ENCOUNTER — Other Ambulatory Visit: Payer: Self-pay

## 2019-10-15 ENCOUNTER — Ambulatory Visit
Admission: RE | Admit: 2019-10-15 | Discharge: 2019-10-15 | Disposition: A | Discharge status: Home / Self Care | Payer: Medicare Other | Source: Ambulatory Visit | Attending: Hematology and Oncology | Admitting: Hematology and Oncology

## 2019-10-15 ENCOUNTER — Ambulatory Visit: Payer: Medicare Other | Admitting: Hematology and Oncology

## 2019-10-15 DIAGNOSIS — Z1231 Encounter for screening mammogram for malignant neoplasm of breast: Secondary | ICD-10-CM

## 2019-10-15 NOTE — Telephone Encounter (Signed)
Unless the 7.5mg  has become cheaper- I want her on the 15 mg- can delete 7.5 mg from her list if this is not cheaper as she has an older rx for 15 mg on her list for temazepam

## 2019-10-16 ENCOUNTER — Ambulatory Visit: Payer: Medicare Other

## 2019-10-21 NOTE — Progress Notes (Signed)
Patient Care Team: Marin Olp, MD as PCP - General (Family Medicine) Lelon Perla, MD as PCP - Cardiology (Cardiology) Darlin Coco, MD (Cardiology) Garald Balding, MD (Orthopedic Surgery) Nicholas Lose, MD as Consulting Physician (Hematology and Oncology) Excell Seltzer, MD (Inactive) as Consulting Physician (General Surgery) Gardenia Phlegm, NP as Nurse Practitioner (Hematology and Oncology)  DIAGNOSIS:    ICD-10-CM   1. Malignant neoplasm of upper-outer quadrant of left breast in female, estrogen receptor positive ([place])  C50.412    Z17.0     SUMMARY OF ONCOLOGIC HISTORY: Oncology History  Malignant neoplasm of upper-outer quadrant of left breast in female, estrogen receptor positive ([place])  06/07/2017 Initial Diagnosis   Suspicious mass left breast 1 o'clock position at the site of the palpable lump 2.3 x 1.2 x 3.8 cm, 1 cm away is some mild satellite lesions 7 x 5 x 5 mm, no axillary lymph nodes biopsy revealed IDC with DCIS grade 3.  ER 100%, PR 100%, Ki-67 70%, HER-2 positive ratio 2.03, gene copy #9.25, T2 N0 stage Ib   07/22/2017 Surgery   Left mastectomy: Grade 3 multifocal IDC with DCIS, 3.9 cm and 0.8 cm, margins negative, ER 100%, PR 100%, HER-2 positive ratio 2.03, T2 N0 stage Ia    07/2017 -  Anti-estrogen oral therapy   Letrozole daily     CHIEF COMPLIANT: Follow-up of left breast cancer on letrozole  INTERVAL HISTORY: Glenda [name] is a 86 y.o. with above-mentioned history of left breast cancer treated with left mastectomy and who is currently on anti-estrogen therapy with letrozole. Mammogram on 10/15/19 showed no evidence of malignancy in the right breast. She presents to the clinic today for annual follow-up. Since the last year she had fallen and fractured her hip and had required a hip replacement surgery. She has also noticed a scab on her left earlobe and she is worried about skin cancer.  ALLERGIES:  is allergic to ace  inhibitors, halcion [triazolam], pentazocine lactate, sulfa drugs cross reactors, trazodone and nefazodone, amitriptyline, avelox [moxifloxacin hcl in nacl], and penicillins.  MEDICATIONS:  Current Outpatient Medications  Medication Sig Dispense Refill  . amLODipine (NORVASC) 5 MG tablet Take 1 tablet (5 mg total) by mouth daily. 90 tablet 3  . anastrozole (ARIMIDEX) 1 MG tablet Take 1 tablet (1 mg total) by mouth daily. 90 tablet 3  . apixaban (ELIQUIS) 2.5 MG TABS tablet Take 1 tablet (2.5 mg total) by mouth 2 (two) times daily. 60 tablet 4  . Cholecalciferol 25 MCG (1000 UT) tablet Take 1 tablet (1,000 Units total) by mouth daily. 90 tablet 3  . Cranberry 425 MG CAPS Take 425 mg by mouth 2 (two) times daily.    Marland Kitchen docusate sodium (COLACE) 100 MG capsule Take 100 mg by mouth 2 (two) times daily.    . furosemide (LASIX) 20 MG tablet Take 1.5 tablets (30 mg total) by mouth daily. 45 tablet 3  . HYDROcodone-acetaminophen (NORCO/VICODIN) 5-325 MG tablet Take 1 tablet by mouth 2 (two) times daily. May refill 1 month. At 9 Am and 3 PM. Separate by 6 hours from temazepam. 60 tablet 0  . Multiple Vitamins-Minerals (CEROVITE ADVANCED FORMULA PO) Take 1 tablet by mouth daily. (0800)    . omeprazole (PRILOSEC) 40 MG capsule Take 40 mg by mouth daily.    . ondansetron (ZOFRAN) 4 MG tablet Take 4 mg by mouth every 6 (six) hours as needed for nausea.    . polyethylene glycol (MIRALAX / GLYCOLAX)  packet Take 17 g by mouth See admin instructions. Mix 17 grams in 8 ounces of juice/water and drink daily, Monday through Saturday    . sertraline (ZOLOFT) 25 MG tablet Take 1 tablet (25 mg total) by mouth daily. 90 tablet 3  . temazepam (RESTORIL) 7.5 MG capsule Take 1 capsule (7.5 mg total) by mouth at bedtime as needed for sleep. 30 capsule 5   No current facility-administered medications for this visit.    PHYSICAL EXAMINATION: ECOG PERFORMANCE STATUS: 3 - Symptomatic, >50% confined to bed  Vitals:    10/22/19 1345  BP: (!) 146/55  Pulse: 60  Resp: 18  Temp: 98.5 F (36.9 C)  SpO2: 96%   Filed Weights   10/22/19 1345  Weight: 110 lb 6.4 oz (50.1 kg)       LABORATORY DATA:  I have reviewed the data as listed CMP Latest Ref Rng & Units 06/25/2019 06/11/2019 06/09/2019  Glucose 70 - 99 mg/dL 115(H) 106(H) 99  BUN 6 - 23 mg/dL 24(H) 27(H) 23  Creatinine 0.40 - 1.20 mg/dL 0.69 0.74 0.79  Sodium 135 - 145 mEq/L 136 137 137  Potassium 3.5 - 5.1 mEq/L 4.5 3.9 4.1  Chloride 96 - 112 mEq/L 98 104 104  CO2 19 - 32 mEq/L 32 24 24  Calcium 8.4 - 10.5 mg/dL 9.2 7.6(L) 7.9(L)  Total Protein 6.0 - 8.3 g/dL 6.9 - -  Total Bilirubin 0.2 - 1.2 mg/dL 0.5 - -  Alkaline Phos 39 - 117 U/L 103 - -  AST 0 - 37 U/L 17 - -  ALT 0 - 35 U/L 7 - -    Lab Results  Component Value Date   WBC 5.5 06/25/2019   HGB 11.9 (L) 06/25/2019   HCT 35.3 (L) 06/25/2019   MCV 88.7 06/25/2019   PLT 346.0 06/25/2019   NEUTROABS 3.8 06/25/2019    ASSESSMENT & PLAN:  Malignant neoplasm of upper-outer quadrant of left breast in female, estrogen receptor positive ([place]) 06/07/2017 suspicious mass left breast 1 o'clock position at the site of the palpable lump 2.3 x 1.2 x 3.8 cm, 1 cm away is some mild satellite lesions 7 x 5 x 5 mm, no axillary lymph nodes biopsy revealed IDC with DCIS grade 3. ER 100%, PR 100%, Ki-67 70%, HER-2 positive ratio 2.03, gene copy #9.25, T2 N0 stage Ib 07/22/2017:Left mastectomy: Grade 3 multifocal IDC with DCIS, 3.9 cm and 0.8 cm, margins negative, ER 100%, PR 100%, HER-2 positive ratio 2.03, T2 N0 stage Ia  (Even though patient is HER-2 positive, given her advanced age, I did not recommend anti-HER-2 therapy.)  Current treatment: Letrozole 2.5 mg daily started 07/29/2017 discontinued 10/10/2018 for hair loss.  Switched to anastrozole 1 mg daily  Anastrozole toxicities: She reports no major side effects to anastrozole therapy. She did fall and fractured her hip and required hip  replacement surgery.  Breast cancer surveillance:  Mammogram 10/16/2019: Benign breast density category C. Breast exam 10/22/2019: Benign  Possible skin cancer on her left earlobe: We will request clearance for dermatology to see and evaluate her. Patient has been try to get in touch with them but was unable to get any appointment.  Return to clinic in 1 year for follow-up    No orders of the defined types were placed in this encounter.  The patient has a good understanding of the overall plan. she agrees with it. she will call with any problems that may develop before the next visit here.  Total  time spent: 20 mins including face to face time and time spent for planning, charting and coordination of care  Nicholas Lose, MD 10/22/2019  I, Cloyde Reams Dorshimer, am acting as scribe for Dr. Nicholas Lose.  I have reviewed the above documentation for accuracy and completeness, and I agree with the above.

## 2019-10-22 ENCOUNTER — Telehealth: Payer: Self-pay | Admitting: *Deleted

## 2019-10-22 ENCOUNTER — Telehealth: Payer: Self-pay | Admitting: Hematology and Oncology

## 2019-10-22 ENCOUNTER — Inpatient Hospital Stay: Payer: Medicare Other | Attending: Hematology and Oncology | Admitting: Hematology and Oncology

## 2019-10-22 ENCOUNTER — Other Ambulatory Visit: Payer: Self-pay

## 2019-10-22 DIAGNOSIS — Z7901 Long term (current) use of anticoagulants: Secondary | ICD-10-CM | POA: Diagnosis not present

## 2019-10-22 DIAGNOSIS — C50412 Malignant neoplasm of upper-outer quadrant of left female breast: Secondary | ICD-10-CM | POA: Insufficient documentation

## 2019-10-22 DIAGNOSIS — Z9012 Acquired absence of left breast and nipple: Secondary | ICD-10-CM | POA: Diagnosis not present

## 2019-10-22 DIAGNOSIS — L989 Disorder of the skin and subcutaneous tissue, unspecified: Secondary | ICD-10-CM | POA: Diagnosis not present

## 2019-10-22 DIAGNOSIS — Z79899 Other long term (current) drug therapy: Secondary | ICD-10-CM | POA: Diagnosis not present

## 2019-10-22 DIAGNOSIS — Z170 Estrogen receptor positive status [ER+]: Secondary | ICD-10-CM | POA: Diagnosis not present

## 2019-10-22 DIAGNOSIS — Z79811 Long term (current) use of aromatase inhibitors: Secondary | ICD-10-CM | POA: Insufficient documentation

## 2019-10-22 DIAGNOSIS — Z923 Personal history of irradiation: Secondary | ICD-10-CM | POA: Diagnosis not present

## 2019-10-22 MED ORDER — ANASTROZOLE 1 MG PO TABS: 1.0000 mg | ORAL_TABLET | Freq: Every day | ORAL | 3 refills | Status: DC

## 2019-10-22 NOTE — Telephone Encounter (Signed)
Scheduled appts per 8/2 los. Gave pt a print out of AVS.

## 2019-10-22 NOTE — Assessment & Plan Note (Addendum)
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 ER 100%, PR 100%, Ki-67 70%, HER-2 positive ratio 2.03, gene copy #9.25, T2 N0 stage Ib 07/22/2017:Left mastectomy: Grade 3 multifocal IDC with DCIS, 3.9 cm and 0.8 cm, margins negative, ER 100%, PR 100%, HER-2 positive ratio 2.03, T2 N0 stage Ia  (Even though patient is HER-2 positive, given her advanced age, I did not recommend anti-HER-2 therapy.)  Current treatment: Letrozole 2.5 mg daily started 07/29/2017 discontinued 10/10/2018 for hair loss.  Switched to anastrozole 1 mg daily  Anastrozole toxicities:  Breast cancer surveillance:  Mammogram 10/16/2019: Benign breast density category C. Breast exam 10/22/2019: Benign  Return to clinic in 1 year for follow-up

## 2019-10-22 NOTE — Telephone Encounter (Signed)
[facility] for appt for ? established pt per Dr Geralyn Flash request for possible skin cancer to ear & need to be seen soon.  I was transferred to Referral's & message left to call back.

## 2019-10-23 ENCOUNTER — Other Ambulatory Visit: Payer: Self-pay | Admitting: *Deleted

## 2019-10-23 DIAGNOSIS — C50412 Malignant neoplasm of upper-outer quadrant of left female breast: Secondary | ICD-10-CM

## 2019-10-23 DIAGNOSIS — Z170 Estrogen receptor positive status [ER+]: Secondary | ICD-10-CM

## 2019-11-01 ENCOUNTER — Telehealth: Payer: Self-pay

## 2019-11-01 NOTE — Telephone Encounter (Signed)
Returned pharmacy call and gave message from Dr. Ronney Lion last message "Unless the 7.5mg  has become cheaper- I want her on the 15 mg- can delete 7.5 mg from her list if this is not cheaper as she has an older rx for 15 mg on her list for temazepam".

## 2019-11-01 NOTE — Telephone Encounter (Signed)
temazepam (RESTORIL) 7.5 MG capsule  dosage is different that last month and just wants to clarify if its correct

## 2019-11-07 ENCOUNTER — Other Ambulatory Visit: Payer: Self-pay | Admitting: Family Medicine

## 2019-11-08 ENCOUNTER — Telehealth: Payer: Self-pay | Admitting: *Deleted

## 2019-11-08 NOTE — Telephone Encounter (Signed)
LR: 10-04-2019 Qty: 60 with 0 refills  Last office visit: 10-04-2019 Upcoming appointment: No pending appt

## 2019-11-08 NOTE — Telephone Encounter (Signed)
A message was left, re: her follow up visit. 

## 2019-11-16 NOTE — Progress Notes (Signed)
[facility] - Clinic Note  11/20/2019     CHIEF COMPLAINT Patient presents for Retina Follow Up   HISTORY OF PRESENT ILLNESS: Glenda [name] is a 86 y.o. female who presents to the clinic today for:   HPI    Retina Follow Up    Patient presents with  Dry AMD.  In both eyes.  Severity is moderate.  Duration of 6 weeks.  Since onset it is stable.  I, the attending physician,  performed the HPI with the patient and updated documentation appropriately.          Comments    Retina follow up. Patient states vision in the left eye is no better. Patient recently started Areds2       Last edited by Rennis Chris, MD on 11/20/2019  8:39 PM. (History)    pt states she doesn't feel like her vision has changed since last visit  Referring physician: Blima Ledger, OD [address] [place],  [place] [address]  HISTORICAL INFORMATION:   Selected notes from the MEDICAL RECORD NUMBER Referred for ret eval OU   CURRENT MEDICATIONS: No current outpatient medications on file. (Ophthalmic Drugs)   No current facility-administered medications for this visit. (Ophthalmic Drugs)   Current Outpatient Medications (Other)  Medication Sig  . amLODipine (NORVASC) 5 MG tablet Take 1 tablet (5 mg total) by mouth daily.  Marland Kitchen anastrozole (ARIMIDEX) 1 MG tablet Take 1 tablet (1 mg total) by mouth daily.  Marland Kitchen apixaban (ELIQUIS) 2.5 MG TABS tablet Take 1 tablet (2.5 mg total) by mouth 2 (two) times daily.  . Cholecalciferol 25 MCG (1000 UT) tablet Take 1 tablet (1,000 Units total) by mouth daily.  . Cranberry 425 MG CAPS Take 425 mg by mouth 2 (two) times daily.  Marland Kitchen docusate sodium (COLACE) 100 MG capsule Take 100 mg by mouth 2 (two) times daily.  . furosemide (LASIX) 20 MG tablet Take 1.5 tablets (30 mg total) by mouth daily.  Marland Kitchen HYDROcodone-acetaminophen (NORCO/VICODIN) 5-325 MG tablet TAKE (1) TABLET BY MOUTH TWICE DAILY AT 9AM AND 3PM. SEPARATE BY 6 HOURS FROM TEMAZEPAM.  .  Multiple Vitamins-Minerals (CEROVITE ADVANCED FORMULA PO) Take 1 tablet by mouth daily. (0800)  . omeprazole (PRILOSEC) 40 MG capsule Take 40 mg by mouth daily.  . ondansetron (ZOFRAN) 4 MG tablet Take 4 mg by mouth every 6 (six) hours as needed for nausea.  . polyethylene glycol (MIRALAX / GLYCOLAX) packet Take 17 g by mouth See admin instructions. Mix 17 grams in 8 ounces of juice/water and drink daily, Monday through Saturday  . sertraline (ZOLOFT) 25 MG tablet Take 1 tablet (25 mg total) by mouth daily.  . temazepam (RESTORIL) 7.5 MG capsule Take 1 capsule (7.5 mg total) by mouth at bedtime as needed for sleep.   No current facility-administered medications for this visit. (Other)      REVIEW OF SYSTEMS: ROS    Positive for: Musculoskeletal, HENT, Cardiovascular, Eyes, Psychiatric   Negative for: Constitutional, Gastrointestinal, Neurological, Skin, Genitourinary, Endocrine, Respiratory, Allergic/Imm, Heme/Lymph   Last edited by Lana Fish, COT on 11/20/2019  1:14 PM. (History)       ALLERGIES Allergies  Allergen Reactions  . Ace Inhibitors Other (See Comments)    Cough  . Halcion [Triazolam]     UNSPECIFIED REACTION   . Pentazocine Lactate     UNSPECIFIED REACTION   . Sulfa Drugs Cross Reactors     UNSPECIFIED REACTION   . Trazodone And  Nefazodone Other (See Comments)    Per MAR  . Amitriptyline Other (See Comments)    Hyperactivity  . Avelox [Moxifloxacin Hcl In Nacl] Other (See Comments)    dizziness  . Penicillins Rash    PAST MEDICAL HISTORY Past Medical History:  Diagnosis Date  . Aortic stenosis    Echo 02/2011 showing mild AS with normal LV systolic function  . Arthritis    "lower back" (10/09/2015)  . Atrial flutter with rapid ventricular response ([place]) 08/19/2016   s/p successful DCCV on 08/20/16, continue eliquis  . Chronic bronchitis ([place])    "off and on; several years" (10/09/2015)  . Chronic diastolic CHF (congestive heart failure) ([place])    a.  12/2015 Echo: EF 55-60%, mild AI/MR, mildly dil LA.  Marland Kitchen Chronic lower back pain   . Complication of anesthesia    "they had trouble waking me up after colon resection"  . Confusion    Originally listed as TIA-pt denies this hx on 10/09/2015 "it was the [place] I was taking; they had thought I was having a st originally roke"  . DDD (degenerative disc disease), lumbar    severe facet dz and adv DDD MRI L spine 2009  . Diverticulosis   . Dysrhythmia    A-Fib  . Femur fracture, right ([place]) 10/18/2018  . GERD (gastroesophageal reflux disease)   . Hiatal hernia    hx  . Hypercholesterolemia   . Hypertension   . Insomnia   . OA (osteoarthritis) of knee   . Paroxysmal atrial flutter ([place])    a. 09/2015 s/p TEE/DCCV;  b. CHA2DS2VASc = 5-->Eliquis.  . Peripheral edema   . PVCs (premature ventricular contractions)   . Sacral fracture ([place]) 11/06/2012  . Vertigo 11/19/2012   Past Surgical History:  Procedure Laterality Date  . BREAST BIOPSY Left   . CARDIAC CATHETERIZATION  02/17/2001   MILD REGURGITATION. EF 60%  . CARDIOVERSION N/A 10/08/2015   Procedure: CARDIOVERSION;  Surgeon: Sanda Klein, MD;  Location: [facility];  Service: Cardiovascular;  Laterality: N/A;  . CARDIOVERSION N/A 08/20/2016   Procedure: Cardioversion;  Surgeon: Evans Lance, MD;  Location: [facility];  Service: Cardiovascular;  Laterality: N/A;  . CATARACT EXTRACTION W/ INTRAOCULAR LENS  IMPLANT, BILATERAL Bilateral   . CESAREAN SECTION  1954  . COLECTOMY     related to "blockage"  . COLONOSCOPY    . EXCISIONAL HEMORRHOIDECTOMY    . HIP PINNING,CANNULATED Right 10/19/2018   Procedure: Right percutaneous hip pinning;  Surgeon: Erle Crocker, MD;  Location: [facility];  Service: Orthopedics;  Laterality: Right;  . INCISIONAL HERNIA REPAIR N/A 06/07/2019   Procedure: Reduction and Repair of Incisional Hernia with Overlay Mesh;  Surgeon: Kinsinger, Arta Bruce, MD;  Location: [facility];  Service: General;   Laterality: N/A;  . INGUINAL HERNIA REPAIR Bilateral   . KYPHOPLASTY Right 09/11/2012   Procedure: Right Acrylic Sacroplasty;  Surgeon: Kristeen Miss, MD;  Location: [facility];  Service: Neurosurgery;  Laterality: Right;  Right  Acrylic Sacroplasty  . TEE WITHOUT CARDIOVERSION N/A 10/08/2015   Procedure: TRANSESOPHAGEAL ECHOCARDIOGRAM (TEE);  Surgeon: Sanda Klein, MD;  Location: [facility];  Service: Cardiovascular;  Laterality: N/A;  . TOTAL HIP ARTHROPLASTY Right 02/05/2019   Procedure: [place] TOTAL HIP ARTHROPLASTY ANTERIOR APPROACH AND REMOVAL OF SCREWS;  Surgeon: Frederik Pear, MD;  Location: [facility];  Service: Orthopedics;  Laterality: Right;  . TOTAL KNEE ARTHROPLASTY Right   . TOTAL MASTECTOMY Left 07/22/2017   Procedure: TOTAL  MASTECTOMY;  Surgeon: Excell Seltzer, MD;  Location: [facility];  Service: General;  Laterality: Left;  . UMBILICAL HERNIA REPAIR      FAMILY HISTORY Family History  Problem Relation Age of Onset  . Heart disease Mother   . Hypertension Father   . Other Sister        71 in 2019  . Other Sister        1 in 2019  . Heart disease Sister   . Heart attack Neg Hx   . Stroke Neg Hx     SOCIAL HISTORY Social History   Tobacco Use  . Smoking status: Never Smoker  . Smokeless tobacco: Never Used  Vaping Use  . Vaping Use: Never used  Substance Use Topics  . Alcohol use: No  . Drug use: No         OPHTHALMIC EXAM:  Base Eye Exam    Visual Acuity (Snellen - Linear)      Right Left   Dist cc 20/25-1 20/60-2   Dist ph cc  20/NI   Correction: Glasses       Tonometry (Tonopen, 1:15 PM)      Right Left   Pressure 17 17       Pupils      Dark Light Shape React APD   Right 3 2 Round Brisk None   Left 3 2 Round Brisk None       Visual Fields      Left Right    Full Full       Extraocular Movement      Right Left    Full, Ortho Full, Ortho       Neuro/Psych    Oriented x3: Yes   Mood/Affect: Normal       Dilation    Both eyes:  1.0% Mydriacyl, 2.5% Phenylephrine @ 1:15 PM        Slit Lamp and Fundus Exam    Slit Lamp Exam      Right Left   Lids/Lashes Dermatochalasis - upper lid, mild Meibomian gland dysfunction Dermatochalasis - upper lid, mild Meibomian gland dysfunction   Conjunctiva/Sclera White and quiet White and quiet   Cornea arcus, well healed temporal cataract wounds, trace Punctate epithelial erosions arcus, well healed temporal cataract wounds, 1+Punctate epithelial erosions   Anterior Chamber Deep and quiet Deep and quiet   Iris Round and dilated Round and dilated   Lens 3 piece Posterior chamber intraocular lens, anterior capsular phimosis 3 piece Posterior chamber intraocular lens, mild anterior capsular phimosis, open PC   Vitreous Vitreous syneresis Vitreous syneresis, Posterior vitreous detachment       Fundus Exam      Right Left   Disc mild Pallor, Sharp rim, Compact mild Pallor, Sharp rim, temporal PPA   C/D Ratio 0.2 0.1   Macula Blunted foveal reflex, Drusen, RPE mottling, clumping and atrophy, punctate IRH superior macula - persistent Blunted foveal reflex, Drusen, RPE mottling, clumping and atrophy, +PED, no heme   Vessels Vascular attenuation, tortuousity Vascular attenuation, mild tortuousity   Periphery Attached, pigmented CR atrophy at 0500 midzone, scattered peripheral drusen, mild reticular degeneration, No heme  Attached, scattered blot hemes greatest superior to disc          Refraction    Wearing Rx      Sphere Cylinder Axis Add   Right [place] +0.50 051 +3.00   Left -1.50 +1.00 107 +3.00          IMAGING AND PROCEDURES  Imaging and Procedures for 11/20/2019  OCT, Retina - OU - Both Eyes       Right Eye Quality was good. Central Foveal Thickness: 294. Progression has been stable. Findings include normal foveal contour, no IRF, retinal drusen , no SRF, pigment epithelial detachment (Shallow PED; focal pocket of SRF).   Left Eye Quality was good. Central Foveal  Thickness: 278. Progression has improved. Findings include normal foveal contour, no IRF, retinal drusen , outer retinal atrophy, pigment epithelial detachment, intraretinal hyper-reflective material (Interval improvement in central PED).   Notes *Images captured and stored on drive  Diagnosis / Impression:  NFP; no IRF OU +drusen/PED OU Nonexudative ARMD OU   Clinical management:  See below  Abbreviations: NFP - Normal foveal profile. CME - cystoid macular edema. PED - pigment epithelial detachment. IRF - intraretinal fluid. SRF - subretinal fluid. EZ - ellipsoid zone. ERM - epiretinal membrane. ORA - outer retinal atrophy. ORT - outer retinal tubulation. SRHM - subretinal hyper-reflective material. IRHM - intraretinal hyper-reflective material                 ASSESSMENT/PLAN:    ICD-10-CM   1. Intermediate stage nonexudative age-related macular degeneration of both eyes  H35.3132   2. Retinal edema  H35.81 OCT, Retina - OU - Both Eyes  3. Essential hypertension  I10   4. Hypertensive retinopathy of both eyes  H35.033   5. Pseudophakia, both eyes  Z96.1     1,2. Age related macular degeneration, non-exudative, OU (OS > OD)  - early RPE atrophy OS  - today, exam and OCT shows interval improvement in central PEDs OS  - OD stable on exam and OCT  - BCVA OD 20/25 (stable), OS 20/60 (improved from 20/100)  - The incidence, anatomy, and pathology of dry AMD, risk of progression, and the AREDS and AREDS 2 studies including smoking risks discussed with patient.  - continue amsler grid monitoring  - f/u 8 weeks, DFE, OCT  3,4. Hypertensive retinopathy OU - discussed importance of tight BP control - monitor  5. Pseudophakia OU  - s/p CE/IOL OU  - IOL in good position, doing well  - monitor   Ophthalmic Meds Ordered this visit:  No orders of the defined types were placed in this encounter.      Return in about 8 weeks (around 01/15/2020) for f/u 8 weeks, non-exu ARMD  OU, DFE, OCT.  There are no Patient Instructions on file for this visit.   Explained the diagnoses, plan, and follow up with the patient and they expressed understanding.  Patient expressed understanding of the importance of proper follow up care.  This document serves as a record of services personally performed by Gardiner Sleeper, MD, PhD. It was created on their behalf by Estill Bakes, COT an ophthalmic technician. The creation of this record is the provider's dictation and/or activities during the visit.    Electronically signed by: Estill Bakes, COT 8.27.21 @ 9:41 PM   This document serves as a record of services personally performed by Gardiner Sleeper, MD, PhD. It was created on their behalf by San Jetty. Owens Shark, OA an ophthalmic technician. The creation of this record is the provider's dictation and/or activities during the visit.    Electronically signed by: San Jetty. Owens Shark, [place] 08.31.2021 9:41 PM  Gardiner Sleeper, M.D., Ph.D. Diseases & Surgery of the Retina and [place] 11/20/2019   I have reviewed the above documentation for accuracy  and completeness, and I agree with the above. Gardiner Sleeper, M.D., Ph.D. 11/20/19 9:41 PM   Abbreviations: M myopia (nearsighted); A astigmatism; H hyperopia (farsighted); P presbyopia; Mrx spectacle prescription;  CTL contact lenses; OD right eye; OS left eye; OU both eyes  XT exotropia; ET esotropia; PEK punctate epithelial keratitis; PEE punctate epithelial erosions; DES dry eye syndrome; MGD meibomian gland dysfunction; ATs artificial tears; PFAT's preservative free artificial tears; [place] nuclear sclerotic cataract; PSC posterior subcapsular cataract; ERM epi-retinal membrane; PVD posterior vitreous detachment; RD retinal detachment; DM diabetes mellitus; DR diabetic retinopathy; NPDR non-proliferative diabetic retinopathy; PDR proliferative diabetic retinopathy; CSME clinically significant macular edema; DME  diabetic macular edema; dbh dot blot hemorrhages; CWS cotton wool spot; POAG primary open angle glaucoma; C/D cup-to-disc ratio; HVF humphrey visual field; GVF goldmann visual field; OCT optical coherence tomography; IOP intraocular pressure; BRVO Branch retinal vein occlusion; CRVO central retinal vein occlusion; CRAO central retinal artery occlusion; BRAO branch retinal artery occlusion; RT retinal tear; SB scleral buckle; PPV pars plana vitrectomy; VH Vitreous hemorrhage; PRP panretinal laser photocoagulation; IVK intravitreal kenalog; VMT vitreomacular traction; MH Macular hole;  NVD neovascularization of the disc; NVE neovascularization elsewhere; AREDS age related eye disease study; ARMD age related macular degeneration; POAG primary open angle glaucoma; EBMD epithelial/anterior basement membrane dystrophy; ACIOL anterior chamber intraocular lens; IOL intraocular lens; PCIOL posterior chamber intraocular lens; Phaco/IOL phacoemulsification with intraocular lens placement; [place] photorefractive keratectomy; LASIK laser assisted in situ keratomileusis; HTN hypertension; DM diabetes mellitus; COPD chronic obstructive pulmonary disease

## 2019-11-20 ENCOUNTER — Encounter (INDEPENDENT_AMBULATORY_CARE_PROVIDER_SITE_OTHER): Payer: Self-pay | Admitting: Ophthalmology

## 2019-11-20 ENCOUNTER — Ambulatory Visit (INDEPENDENT_AMBULATORY_CARE_PROVIDER_SITE_OTHER): Payer: Medicare Other | Admitting: Ophthalmology

## 2019-11-20 ENCOUNTER — Other Ambulatory Visit: Payer: Self-pay

## 2019-11-20 DIAGNOSIS — Z961 Presence of intraocular lens: Secondary | ICD-10-CM

## 2019-11-20 DIAGNOSIS — H353132 Nonexudative age-related macular degeneration, bilateral, intermediate dry stage: Secondary | ICD-10-CM | POA: Diagnosis not present

## 2019-11-20 DIAGNOSIS — I10 Essential (primary) hypertension: Secondary | ICD-10-CM | POA: Diagnosis not present

## 2019-11-20 DIAGNOSIS — H3581 Retinal edema: Secondary | ICD-10-CM | POA: Diagnosis not present

## 2019-11-20 DIAGNOSIS — H35033 Hypertensive retinopathy, bilateral: Secondary | ICD-10-CM | POA: Diagnosis not present

## 2019-12-04 NOTE — Progress Notes (Signed)
HPI: FU hypertension andatrial flutter. History of atypical chest pain and palpitations. She had a exercise treadmill test on 01/26/11 which showed no evidence of ischemia. 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 Admitted July 2017 with atrial flutter vs atrial tachycardia and underwent TEE guided cardioversion. Patient had recurrent atrial flutter May 2018 and had repeat cardioversion. Appointment was scheduled to see Dr. Lovena Le for consideration of ablation but he was not availableandseen byRenee Danne Harbor; she was inclined not to pursue procedures. Monitor March 2019 showed sinus with PACs and PVCs but no atrial flutter. Patient had recurrent atrial flutter following hip replacement November 2020. Echocardiogram February 2021 showed normal LV function, mild asymmetric left ventricular hypertrophy, mild left atrial enlargement, mild mitral regurgitation, mild aortic insufficiency.  Since last seen,over the past 3 weeks she notes increased palpitations.  She has some dyspnea on exertion and pedal edema as well worse compared to previous.  Occasional sharp pain in her chest but no exertional symptoms.  No syncope or bleeding.  Current Outpatient Medications  Medication Sig Dispense Refill   amLODipine (NORVASC) 5 MG tablet Take 1 tablet (5 mg total) by mouth daily. 90 tablet 3   anastrozole (ARIMIDEX) 1 MG tablet Take 1 tablet (1 mg total) by mouth daily. 90 tablet 3   apixaban (ELIQUIS) 2.5 MG TABS tablet Take 1 tablet (2.5 mg total) by mouth 2 (two) times daily. 60 tablet 4   Cholecalciferol 25 MCG (1000 UT) tablet Take 1 tablet (1,000 Units total) by mouth daily. 90 tablet 3   Cranberry 425 MG CAPS Take 425 mg by mouth 2 (two) times daily.     docusate sodium (COLACE) 100 MG capsule Take 100 mg by mouth 2 (two) times daily.     furosemide (LASIX) 20 MG tablet Take 1.5 tablets (30 mg total) by mouth daily. 45 tablet 3   HYDROcodone-acetaminophen (NORCO/VICODIN) 5-325 MG tablet TAKE (1) TABLET BY MOUTH  TWICE DAILY AT 9AM AND 3PM. SEPARATE BY 6 HOURS FROM TEMAZEPAM. 60 tablet 0   Multiple Vitamins-Minerals (CEROVITE ADVANCED FORMULA PO) Take 1 tablet by mouth daily. (0800)     omeprazole (PRILOSEC) 40 MG capsule Take 40 mg by mouth daily.     ondansetron (ZOFRAN) 4 MG tablet Take 4 mg by mouth every 6 (six) hours as needed for nausea.     polyethylene glycol (MIRALAX / GLYCOLAX) packet Take 17 g by mouth See admin instructions. Mix 17 grams in 8 ounces of juice/water and drink daily, Monday through Saturday     sertraline (ZOLOFT) 25 MG tablet Take 1 tablet (25 mg total) by mouth daily. 90 tablet 3   temazepam (RESTORIL) 7.5 MG capsule Take 1 capsule (7.5 mg total) by mouth at bedtime as needed for sleep. 30 capsule 5   No current facility-administered medications for this visit.     Past Medical History:  Diagnosis Date   Aortic stenosis    Echo 02/2011 showing mild AS with normal LV systolic function   Arthritis    "lower back" (10/09/2015)   Atrial flutter with rapid ventricular response ([place]) 08/19/2016   s/p successful DCCV on 08/20/16, continue eliquis   Chronic bronchitis ([place])    "off and on; several years" (10/09/2015)   Chronic diastolic CHF (congestive heart failure) ([place])    a. 12/2015 Echo: EF 55-60%, mild AI/MR, mildly dil LA.   Chronic lower back pain    Complication of anesthesia    "they had trouble waking me up after colon  resection"   Confusion    Originally listed as TIA-pt denies this hx on 10/09/2015 "it was the [place] I was taking; they had thought I was having a st originally roke"   DDD (degenerative disc disease), lumbar    severe facet dz and adv DDD MRI L spine 2009   Diverticulosis    Dysrhythmia    A-Fib   Femur fracture, right ([place]) 10/18/2018   GERD (gastroesophageal reflux disease)    Hiatal hernia    hx   Hypercholesterolemia    Hypertension    Insomnia    OA (osteoarthritis) of knee    Paroxysmal atrial flutter ([place])     a. 09/2015 s/p TEE/DCCV;  b. CHA2DS2VASc = 5-->Eliquis.   Peripheral edema    PVCs (premature ventricular contractions)    Sacral fracture ([place]) 11/06/2012   Vertigo 11/19/2012    Past Surgical History:  Procedure Laterality Date   BREAST BIOPSY Left    CARDIAC CATHETERIZATION  02/17/2001   MILD REGURGITATION. EF 60%   CARDIOVERSION N/A 10/08/2015   Procedure: CARDIOVERSION;  Surgeon: Sanda Klein, MD;  Location: [facility];  Service: Cardiovascular;  Laterality: N/A;   CARDIOVERSION N/A 08/20/2016   Procedure: Cardioversion;  Surgeon: Evans Lance, MD;  Location: [facility];  Service: Cardiovascular;  Laterality: N/A;   CATARACT EXTRACTION W/ INTRAOCULAR LENS  IMPLANT, BILATERAL Bilateral    CESAREAN SECTION  1954   COLECTOMY     related to "blockage"   COLONOSCOPY     EXCISIONAL HEMORRHOIDECTOMY     HIP PINNING,CANNULATED Right 10/19/2018   Procedure: Right percutaneous hip pinning;  Surgeon: Erle Crocker, MD;  Location: [facility];  Service: Orthopedics;  Laterality: Right;   INCISIONAL HERNIA REPAIR N/A 06/07/2019   Procedure: Reduction and Repair of Incisional Hernia with Overlay Mesh;  Surgeon: Kinsinger, Arta Bruce, MD;  Location: [facility];  Service: General;  Laterality: N/A;   INGUINAL HERNIA REPAIR Bilateral    KYPHOPLASTY Right 09/11/2012   Procedure: Right Acrylic Sacroplasty;  Surgeon: Kristeen Miss, MD;  Location: [facility];  Service: Neurosurgery;  Laterality: Right;  Right  Acrylic Sacroplasty   TEE WITHOUT CARDIOVERSION N/A 10/08/2015   Procedure: TRANSESOPHAGEAL ECHOCARDIOGRAM (TEE);  Surgeon: Sanda Klein, MD;  Location: [facility];  Service: Cardiovascular;  Laterality: N/A;   TOTAL HIP ARTHROPLASTY Right 02/05/2019   Procedure: [place] TOTAL HIP ARTHROPLASTY ANTERIOR APPROACH AND REMOVAL OF SCREWS;  Surgeon: Frederik Pear, MD;  [facility];  Service: Orthopedics;  Laterality: Right;   TOTAL KNEE ARTHROPLASTY Right    TOTAL  MASTECTOMY Left 07/22/2017   Procedure: TOTAL MASTECTOMY;  Surgeon: Excell Seltzer, MD;  Location: [facility];  Service: General;  Laterality: Left;   UMBILICAL HERNIA REPAIR      Social History   Socioeconomic History   Marital status: Widowed    Spouse name: Not on file   Number of children: Not on file   Years of education: Not on file   Highest education level: Not on file  Occupational History   Not on file  Tobacco Use   Smoking status: Never Smoker   Smokeless tobacco: Never Used  Vaping Use   Vaping Use: Never used  Substance and Sexual Activity   Alcohol use: No   Drug use: No   Sexual activity: Never  Other Topics Concern   Not on file  Social History Narrative   Widowed since 2010   Lives in assisted living at [facility]  Claims adjusting when in [place]- 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 [facility]   She has two children ( one local is a Marine scientist with [employer], and one in [place])       Hobbies: walks after every meal, difficulty standing with back, activities at [facility]   Social Determinants of Health   Financial Resource Strain:    Difficulty of Paying Living Expenses: Not on file  Food Insecurity:    Worried About Charity fundraiser in the Last Year: Not on file   [employer] of Food in the Last Year: Not on file  Transportation Needs:    Lack of Transportation (Medical): Not on file   Lack of Transportation (Non-Medical): Not on file  Physical Activity:    Days of Exercise per Week: Not on file   Minutes of Exercise per Session: Not on file  Stress:    Feeling of Stress : Not on file  Social Connections:    Frequency of Communication with Friends and Family: Not on file   Frequency of Social Gatherings with Friends and Family: Not on file   Attends Religious Services: Not on file   Active Member of [place] or Organizations: Not on file   Attends Archivist Meetings: Not on file   Marital Status: Not on  file  Intimate Partner Violence:    Fear of Current or Ex-Partner: Not on file   Emotionally Abused: Not on file   Physically Abused: Not on file   Sexually Abused: Not on file    Family History  Problem Relation Age of Onset   Heart disease Mother    Hypertension Father    Other Sister        82 in 2019   Other Sister        43 in 2019   Heart disease Sister    Heart attack Neg Hx    Stroke Neg Hx     ROS: no fevers or chills, productive cough, hemoptysis, dysphasia, odynophagia, melena, hematochezia, dysuria, hematuria, rash, seizure activity, orthopnea, PND, claudication. Remaining systems are negative.  Physical Exam: Well-developed well-nourished in no acute distress.  Skin is warm and dry.  HEENT is normal.  Neck is supple.  Chest is clear to auscultation with normal expansion.  Cardiovascular exam is irregular and tachycardic Abdominal exam nontender or distended. No masses palpated. Extremities show trace to 1+ ankle edema. neuro grossly intact  ECG-atrial flutter with occasional PVC, left axis deviation.  Personally reviewed  A/P  1 history of atrial flutter-patient is back in atrial flutter this morning.  She appears to be symptomatic with worsening dyspnea on exertion and pedal edema.  We will arrange repeat cardioversion.  She has missed no doses of apixaban.  She continues to have episodes of atrial flutter.  I would like to avoid procedures such as ablation given patient's age.  We will refer to atrial fibrillation clinic for suggestions concerning antiarrhythmics.  Flecainide might be the best option.  I would like to avoid amiodarone as she has had bradycardia when in sinus rhythm previously with heart rates in the 40s.  Will not add AV nodal blocking agent at this point due to history of bradycardia.  Check BMET and CBC.  2 hypertension-patient's blood pressure is controlled today.  Continue present medical regimen.  3 history of palpitations-As  outlined above we discontinued beta-blockade due to bradycardia.  4 chronic diastolic congestive heart failure-minimally volume overloaded on examination.  Continue Lasix.  Check potassium and renal function.    5 Hyperlipidemia-continue statin.  Kirk Ruths, MD

## 2019-12-04 NOTE — H&P (View-Only) (Signed)
HPI: FU hypertension andatrial flutter. History of atypical chest pain and palpitations. She had a exercise treadmill test on 01/26/11 which showed no evidence of ischemia. 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 Admitted July 2017 with atrial flutter vs atrial tachycardia and underwent TEE guided cardioversion. Patient had recurrent atrial flutter May 2018 and had repeat cardioversion. Appointment was scheduled to see Dr. Lovena Le for consideration of ablation but he was not availableandseen byRenee Danne Harbor; she was inclined not to pursue procedures. Monitor March 2019 showed sinus with PACs and PVCs but no atrial flutter. Patient had recurrent atrial flutter following hip replacement November 2020. Echocardiogram February 2021 showed normal LV function, mild asymmetric left ventricular hypertrophy, mild left atrial enlargement, mild mitral regurgitation, mild aortic insufficiency.  Since last seen,over the past 3 weeks she notes increased palpitations.  She has some dyspnea on exertion and pedal edema as well worse compared to previous.  Occasional sharp pain in her chest but no exertional symptoms.  No syncope or bleeding.  Current Outpatient Medications  Medication Sig Dispense Refill  . amLODipine (NORVASC) 5 MG tablet Take 1 tablet (5 mg total) by mouth daily. 90 tablet 3  . anastrozole (ARIMIDEX) 1 MG tablet Take 1 tablet (1 mg total) by mouth daily. 90 tablet 3  . apixaban (ELIQUIS) 2.5 MG TABS tablet Take 1 tablet (2.5 mg total) by mouth 2 (two) times daily. 60 tablet 4  . Cholecalciferol 25 MCG (1000 UT) tablet Take 1 tablet (1,000 Units total) by mouth daily. 90 tablet 3  . Cranberry 425 MG CAPS Take 425 mg by mouth 2 (two) times daily.    Marland Kitchen docusate sodium (COLACE) 100 MG capsule Take 100 mg by mouth 2 (two) times daily.    . furosemide (LASIX) 20 MG tablet Take 1.5 tablets (30 mg total) by mouth daily. 45 tablet 3  . HYDROcodone-acetaminophen (NORCO/VICODIN) 5-325 MG tablet TAKE (1) TABLET BY MOUTH  TWICE DAILY AT 9AM AND 3PM. SEPARATE BY 6 HOURS FROM TEMAZEPAM. 60 tablet 0  . Multiple Vitamins-Minerals (CEROVITE ADVANCED FORMULA PO) Take 1 tablet by mouth daily. (0800)    . omeprazole (PRILOSEC) 40 MG capsule Take 40 mg by mouth daily.    . ondansetron (ZOFRAN) 4 MG tablet Take 4 mg by mouth every 6 (six) hours as needed for nausea.    . polyethylene glycol (MIRALAX / GLYCOLAX) packet Take 17 g by mouth See admin instructions. Mix 17 grams in 8 ounces of juice/water and drink daily, Monday through Saturday    . sertraline (ZOLOFT) 25 MG tablet Take 1 tablet (25 mg total) by mouth daily. 90 tablet 3  . temazepam (RESTORIL) 7.5 MG capsule Take 1 capsule (7.5 mg total) by mouth at bedtime as needed for sleep. 30 capsule 5   No current facility-administered medications for this visit.     Past Medical History:  Diagnosis Date  . Aortic stenosis    Echo 02/2011 showing mild AS with normal LV systolic function  . Arthritis    "lower back" (10/09/2015)  . Atrial flutter with rapid ventricular response ([place]) 08/19/2016   s/p successful DCCV on 08/20/16, continue eliquis  . Chronic bronchitis ([place])    "off and on; several years" (10/09/2015)  . Chronic diastolic CHF (congestive heart failure) ([place])    a. 12/2015 Echo: EF 55-60%, mild AI/MR, mildly dil LA.  Marland Kitchen Chronic lower back pain   . Complication of anesthesia    "they had trouble waking me up after colon  resection"  . Confusion    Originally listed as TIA-pt denies this hx on 10/09/2015 "it was the [place] I was taking; they had thought I was having a st originally roke"  . DDD (degenerative disc disease), lumbar    severe facet dz and adv DDD MRI L spine 2009  . Diverticulosis   . Dysrhythmia    A-Fib  . Femur fracture, right ([place]) 10/18/2018  . GERD (gastroesophageal reflux disease)   . Hiatal hernia    hx  . Hypercholesterolemia   . Hypertension   . Insomnia   . OA (osteoarthritis) of knee   . Paroxysmal atrial flutter ([place])     a. 09/2015 s/p TEE/DCCV;  b. CHA2DS2VASc = 5-->Eliquis.  . Peripheral edema   . PVCs (premature ventricular contractions)   . Sacral fracture ([place]) 11/06/2012  . Vertigo 11/19/2012    Past Surgical History:  Procedure Laterality Date  . BREAST BIOPSY Left   . CARDIAC CATHETERIZATION  02/17/2001   MILD REGURGITATION. EF 60%  . CARDIOVERSION N/A 10/08/2015   Procedure: CARDIOVERSION;  Surgeon: Sanda Klein, MD;  Location: [facility];  Service: Cardiovascular;  Laterality: N/A;  . CARDIOVERSION N/A 08/20/2016   Procedure: Cardioversion;  Surgeon: Evans Lance, MD;  Location: [facility];  Service: Cardiovascular;  Laterality: N/A;  . CATARACT EXTRACTION W/ INTRAOCULAR LENS  IMPLANT, BILATERAL Bilateral   . CESAREAN SECTION  1954  . COLECTOMY     related to "blockage"  . COLONOSCOPY    . EXCISIONAL HEMORRHOIDECTOMY    . HIP PINNING,CANNULATED Right 10/19/2018   Procedure: Right percutaneous hip pinning;  Surgeon: Erle Crocker, MD;  Location: [facility];  Service: Orthopedics;  Laterality: Right;  . INCISIONAL HERNIA REPAIR N/A 06/07/2019   Procedure: Reduction and Repair of Incisional Hernia with Overlay Mesh;  Surgeon: Kinsinger, Arta Bruce, MD;  Location: [facility];  Service: General;  Laterality: N/A;  . INGUINAL HERNIA REPAIR Bilateral   . KYPHOPLASTY Right 09/11/2012   Procedure: Right Acrylic Sacroplasty;  Surgeon: Kristeen Miss, MD;  Location: [facility];  Service: Neurosurgery;  Laterality: Right;  Right  Acrylic Sacroplasty  . TEE WITHOUT CARDIOVERSION N/A 10/08/2015   Procedure: TRANSESOPHAGEAL ECHOCARDIOGRAM (TEE);  Surgeon: Sanda Klein, MD;  Location: [facility];  Service: Cardiovascular;  Laterality: N/A;  . TOTAL HIP ARTHROPLASTY Right 02/05/2019   Procedure: [place] TOTAL HIP ARTHROPLASTY ANTERIOR APPROACH AND REMOVAL OF SCREWS;  Surgeon: Frederik Pear, MD;  [facility];  Service: Orthopedics;  Laterality: Right;  . TOTAL KNEE ARTHROPLASTY Right   . TOTAL  MASTECTOMY Left 07/22/2017   Procedure: TOTAL MASTECTOMY;  Surgeon: Excell Seltzer, MD;  Location: [facility];  Service: General;  Laterality: Left;  . UMBILICAL HERNIA REPAIR      Social History   Socioeconomic History  . Marital status: Widowed    Spouse name: Not on file  . Number of children: Not on file  . Years of education: Not on file  . Highest education level: Not on file  Occupational History  . Not on file  Tobacco Use  . Smoking status: Never Smoker  . Smokeless tobacco: Never Used  Vaping Use  . Vaping Use: Never used  Substance and Sexual Activity  . Alcohol use: No  . Drug use: No  . Sexual activity: Never  Other Topics Concern  . 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 Difficulty of Paying Living Expenses: Not on file  Food Insecurity:   . Worried About Charity fundraiser in the Last Year: Not on file  . Ran Out of Food in the Last Year: Not on file  Transportation Needs:   . Lack of Transportation (Medical): Not on file  . Lack of Transportation (Non-Medical): Not on file  Physical Activity:   . Days of Exercise per Week: Not on file  . Minutes of Exercise per Session: Not on file  Stress:   . Feeling of Stress : Not on file  Social Connections:   . Frequency of Communication with Friends and Family: Not on file  . Frequency of Social Gatherings with Friends and Family: Not on file  . Attends Religious Services: Not on file  . Active Member of Clubs or Organizations: Not on file  . Attends Archivist Meetings: Not on file  . Marital Status: Not on  file  Intimate Partner Violence:   . Fear of Current or Ex-Partner: Not on file  . Emotionally Abused: Not on file  . Physically Abused: Not on file  . Sexually Abused: Not on file    Family History  Problem Relation Age of Onset  . Heart disease Mother   . Hypertension Father   . Other Sister        74 in 2019  . Other Sister        27 in 2019  . Heart disease Sister   . Heart attack Neg Hx   . Stroke Neg Hx     ROS: no fevers or chills, productive cough, hemoptysis, dysphasia, odynophagia, melena, hematochezia, dysuria, hematuria, rash, seizure activity, orthopnea, PND, claudication. Remaining systems are negative.  Physical Exam: Well-developed well-nourished in no acute distress.  Skin is warm and dry.  HEENT is normal.  Neck is supple.  Chest is clear to auscultation with normal expansion.  Cardiovascular exam is irregular and tachycardic Abdominal exam nontender or distended. No masses palpated. Extremities show trace to 1+ ankle edema. neuro grossly intact  ECG-atrial flutter with occasional PVC, left axis deviation.  Personally reviewed  A/P  1 history of atrial flutter-patient is back in atrial flutter this morning.  She appears to be symptomatic with worsening dyspnea on exertion and pedal edema.  We will arrange repeat cardioversion.  She has missed no doses of apixaban.  She continues to have episodes of atrial flutter.  I would like to avoid procedures such as ablation given patient's age.  We will refer to atrial fibrillation clinic for suggestions concerning antiarrhythmics.  Flecainide might be the best option.  I would like to avoid amiodarone as she has had bradycardia when in sinus rhythm previously with heart rates in the 40s.  Will not add AV nodal blocking agent at this point due to history of bradycardia.  Check BMET and CBC.  2 hypertension-patient's blood pressure is controlled today.  Continue present medical regimen.  3 history of palpitations-As  outlined above we discontinued beta-blockade due to bradycardia.  4 chronic diastolic congestive heart failure-minimally volume overloaded on examination.  Continue Lasix.  Check potassium and renal function.    5 Hyperlipidemia-continue statin.  Kirk Ruths, MD

## 2019-12-10 ENCOUNTER — Other Ambulatory Visit: Payer: Self-pay | Admitting: Family Medicine

## 2019-12-18 ENCOUNTER — Other Ambulatory Visit: Payer: Self-pay

## 2019-12-18 ENCOUNTER — Ambulatory Visit (INDEPENDENT_AMBULATORY_CARE_PROVIDER_SITE_OTHER): Payer: Medicare Other | Admitting: Cardiology

## 2019-12-18 ENCOUNTER — Other Ambulatory Visit: Payer: Self-pay | Admitting: *Deleted

## 2019-12-18 ENCOUNTER — Encounter: Payer: Self-pay | Admitting: Cardiology

## 2019-12-18 VITALS — BP 124/82 | HR 122 | Ht 60.0 in | Wt 114.8 lb

## 2019-12-18 DIAGNOSIS — I5032 Chronic diastolic (congestive) heart failure: Secondary | ICD-10-CM

## 2019-12-18 DIAGNOSIS — I4892 Unspecified atrial flutter: Secondary | ICD-10-CM

## 2019-12-18 DIAGNOSIS — I10 Essential (primary) hypertension: Secondary | ICD-10-CM | POA: Diagnosis not present

## 2019-12-18 DIAGNOSIS — R002 Palpitations: Secondary | ICD-10-CM

## 2019-12-18 LAB — BASIC METABOLIC PANEL
BUN/Creatinine Ratio: 30 — ABNORMAL HIGH (ref 12–28)
BUN: 23 mg/dL (ref 10–36)
CO2: 26 mmol/L (ref 20–29)
Calcium: 9 mg/dL (ref 8.7–10.3)
Chloride: 98 mmol/L (ref 96–106)
Creatinine, Ser: 0.76 mg/dL (ref 0.57–1.00)
GFR calc Af Amer: 78 mL/min/{1.73_m2} (ref >59)
GFR calc non Af Amer: 68 mL/min/{1.73_m2} (ref >59)
Glucose: 79 mg/dL (ref 65–99)
Potassium: 4.8 mmol/L (ref 3.5–5.2)
Sodium: 138 mmol/L (ref 134–144)

## 2019-12-18 LAB — CBC
Hematocrit: 38.8 % (ref 34.0–46.6)
Hemoglobin: 12.6 g/dL (ref 11.1–15.9)
MCH: 29.1 pg (ref 26.6–33.0)
MCHC: 32.5 g/dL (ref 31.5–35.7)
MCV: 90 fL (ref 79–97)
Platelets: 215 10*3/uL (ref 150–450)
RBC: 4.33 x10E6/uL (ref 3.77–5.28)
RDW: 12.9 % (ref 11.7–15.4)
WBC: 5.2 10*3/uL (ref 3.4–10.8)

## 2019-12-18 NOTE — Patient Instructions (Signed)
Medication Instructions:  NO CHANGE *If you need a refill on your cardiac medications before your next appointment, please call your pharmacy*   Lab Work: Your physician recommends that you HAVE LAB WORK TODAY  If you have labs (blood work) drawn today and your tests are completely normal, you will receive your results only by: Marland Kitchen MyChart Message (if you have MyChart) OR . A paper copy in the mail If you have any lab test that is abnormal or we need to change your treatment, we will call you to review the results.   Testing/Procedures:   You are scheduled for a  Cardioversion on Monday 12/24/19 with Dr. Meda Coffee.  Please arrive at the [facility] (Main Entrance A) at [facility]: [address] [place], [place] [address] at [place] am. (1 hour prior to procedure unless lab work is needed; if lab work is needed arrive 1.5 hours ahead)  DIET: Nothing to eat or drink after midnight except a sip of water with medications (see medication instructions below)  Medication Instructions:  Continue your anticoagulant: ELIQUIS  GO TO [place] Thursday 12-20-19 @ 9:30 AM.  Dennis Bast must have a responsible person to drive you home and stay in the waiting area during your procedure. Failure to do so could result in cancellation.  Bring your insurance cards.  *Special Note: Every effort is made to have your procedure done on time. Occasionally there are emergencies that occur at the hospital that may cause delays. Please be patient if a delay does occur.    Follow-Up: At [facility], you and your health needs are our priority.  As part of our continuing mission to provide you with exceptional heart care, we have created designated Provider Care Teams.  These Care Teams include your primary Cardiologist (physician) and Advanced Practice Providers (APPs -  Physician Assistants and Nurse Practitioners) who all work together to provide you with the care you need, when you need it.  We  recommend signing up for the patient portal called "MyChart".  Sign up information is provided on this After Visit Summary.  MyChart is used to connect with patients for Virtual Visits (Telemedicine).  Patients are able to view lab/test results, encounter notes, upcoming appointments, etc.  Non-urgent messages can be sent to your provider as well.   To learn more about what you can do with MyChart, go to [email or web address].    Your next appointment:    Your physician recommends that you schedule a follow-up appointment in: [place]  Your physician recommends that you schedule a follow-up appointment in: [place]

## 2019-12-19 ENCOUNTER — Telehealth: Payer: Self-pay | Admitting: Cardiology

## 2019-12-19 ENCOUNTER — Encounter: Payer: Self-pay | Admitting: *Deleted

## 2019-12-19 NOTE — Telephone Encounter (Signed)
New message:     [place] calling from [place] this patient medication. Please call they need a sign order sent [phone]

## 2019-12-20 ENCOUNTER — Other Ambulatory Visit (HOSPITAL_COMMUNITY)
Admission: RE | Admit: 2019-12-20 | Discharge: 2019-12-20 | Disposition: A | Discharge status: Home / Self Care | Payer: Medicare Other | Source: Ambulatory Visit | Attending: Cardiology | Admitting: Cardiology

## 2019-12-20 DIAGNOSIS — Z20822 Contact with and (suspected) exposure to covid-19: Secondary | ICD-10-CM | POA: Diagnosis not present

## 2019-12-20 DIAGNOSIS — Z01812 Encounter for preprocedural laboratory examination: Secondary | ICD-10-CM | POA: Diagnosis present

## 2019-12-20 LAB — SARS CORONAVIRUS 2 (TAT 6-24 HRS): SARS Coronavirus 2: NEGATIVE

## 2019-12-24 ENCOUNTER — Other Ambulatory Visit: Payer: Self-pay

## 2019-12-24 ENCOUNTER — Ambulatory Visit (HOSPITAL_COMMUNITY): Payer: Medicare Other | Admitting: Anesthesiology

## 2019-12-24 ENCOUNTER — Ambulatory Visit (HOSPITAL_COMMUNITY)
Admission: RE | Admit: 2019-12-24 | Discharge: 2019-12-24 | Disposition: A | Discharge status: Home / Self Care | Payer: Medicare Other | Attending: Cardiology | Admitting: Cardiology

## 2019-12-24 ENCOUNTER — Encounter (HOSPITAL_COMMUNITY)
Admission: RE | Disposition: A | Discharge status: Home / Self Care | Payer: Self-pay | Source: Home / Self Care | Attending: Cardiology

## 2019-12-24 DIAGNOSIS — R0609 Other forms of dyspnea: Secondary | ICD-10-CM | POA: Diagnosis not present

## 2019-12-24 DIAGNOSIS — Z79899 Other long term (current) drug therapy: Secondary | ICD-10-CM | POA: Insufficient documentation

## 2019-12-24 DIAGNOSIS — I110 Hypertensive heart disease with heart failure: Secondary | ICD-10-CM | POA: Diagnosis not present

## 2019-12-24 DIAGNOSIS — Z7901 Long term (current) use of anticoagulants: Secondary | ICD-10-CM | POA: Insufficient documentation

## 2019-12-24 DIAGNOSIS — I4892 Unspecified atrial flutter: Secondary | ICD-10-CM | POA: Insufficient documentation

## 2019-12-24 DIAGNOSIS — I350 Nonrheumatic aortic (valve) stenosis: Secondary | ICD-10-CM | POA: Diagnosis not present

## 2019-12-24 DIAGNOSIS — I4891 Unspecified atrial fibrillation: Secondary | ICD-10-CM | POA: Insufficient documentation

## 2019-12-24 DIAGNOSIS — E785 Hyperlipidemia, unspecified: Secondary | ICD-10-CM | POA: Diagnosis not present

## 2019-12-24 DIAGNOSIS — E7800 Pure hypercholesterolemia, unspecified: Secondary | ICD-10-CM | POA: Diagnosis not present

## 2019-12-24 DIAGNOSIS — K219 Gastro-esophageal reflux disease without esophagitis: Secondary | ICD-10-CM | POA: Insufficient documentation

## 2019-12-24 DIAGNOSIS — I5032 Chronic diastolic (congestive) heart failure: Secondary | ICD-10-CM | POA: Insufficient documentation

## 2019-12-24 HISTORY — PX: CARDIOVERSION: SHX1299

## 2019-12-24 SURGERY — CARDIOVERSION
Anesthesia: General

## 2019-12-24 MED ORDER — PROPOFOL 10 MG/ML IV BOLUS
INTRAVENOUS | Status: DC | PRN
  Administered 2019-12-24: 60 mg via INTRAVENOUS

## 2019-12-24 MED ORDER — LIDOCAINE HCL (CARDIAC) PF 100 MG/5ML IV SOSY
PREFILLED_SYRINGE | INTRAVENOUS | Status: DC | PRN
  Administered 2019-12-24: 20 mg via INTRAVENOUS

## 2019-12-24 NOTE — Transfer of Care (Signed)
Immediate Anesthesia Transfer of Care Note  Patient: Glenda [name]  Procedure(s) Performed: CARDIOVERSION (N/A )  Patient Location: PACU and Endoscopy Unit  Anesthesia Type:General  Level of Consciousness: awake, alert  and oriented  Airway & Oxygen Therapy: Patient Spontanous Breathing  Post-op Assessment: Report given to RN, Post -op Vital signs reviewed and stable and Patient moving all extremities  Post vital signs: Reviewed and stable  Last Vitals:  Vitals Value Taken Time  BP 140/62 12/24/19 0946  Temp    Pulse 58 12/24/19 0947  Resp 18 12/24/19 0947  SpO2 99 % 12/24/19 0947  Vitals shown include unvalidated device data.  Last Pain:  Vitals:   12/24/19 0841  TempSrc: Oral         Complications: No complications documented.

## 2019-12-24 NOTE — Discharge Instructions (Signed)
Electrical Cardioversion Electrical cardioversion is the delivery of a jolt of electricity to restore a normal rhythm to the heart. A rhythm that is too fast or is not regular keeps the heart from pumping well. In this procedure, sticky patches or metal paddles are placed on the chest to deliver electricity to the heart from a device.  Follow these instructions at home:  Do not drive for 24 hours if you were given a sedative during your procedure.  Take over-the-counter and prescription medicines only as told by your health care provider.  Ask your health care provider how to check your pulse. Check it often.  Rest for 48 hours after the procedure or as told by your health care provider.  Avoid or limit your caffeine use as told by your health care provider.  Keep all follow-up visits as told by your health care provider. This is important. Contact a health care provider if:  You feel like your heart is beating too quickly or your pulse is not regular.  You have a serious muscle cramp that does not go away. Get help right away if:  You have discomfort in your chest.  You are dizzy or you feel faint.  You have trouble breathing or you are short of breath.  Your speech is slurred.  You have trouble moving an arm or leg on one side of your body.  Your fingers or toes turn cold or blue. Summary  Electrical cardioversion is the delivery of a jolt of electricity to restore a normal rhythm to the heart.  This procedure may be done right away in an emergency or may be a scheduled procedure if the condition is not an emergency.  Generally, this is a safe procedure.  After the procedure, check your pulse often as told by your health care provider. This information is not intended to replace advice given to you by your health care provider. Make sure you discuss any questions you have with your health care provider. Document Revised: 10/09/2018 Document Reviewed: 10/09/2018 Elsevier  Patient Education  2020 Elsevier Inc.  

## 2019-12-24 NOTE — Anesthesia Preprocedure Evaluation (Signed)
Anesthesia Evaluation  Patient identified by MRN, date of birth, ID band Patient awake    Reviewed: Allergy & Precautions, NPO status , Patient's Chart, lab work & pertinent test results  Airway Mallampati: II  TM Distance: >3 FB Neck ROM: Full    Dental  (+) Teeth Intact, Dental Advisory Given   Pulmonary    breath sounds clear to auscultation       Cardiovascular hypertension,  Rhythm:Irregular Rate:Normal     Neuro/Psych    GI/Hepatic   Endo/Other    Renal/GU      Musculoskeletal   Abdominal   Peds  Hematology   Anesthesia Other Findings   Reproductive/Obstetrics                             Anesthesia Physical Anesthesia Plan  ASA: III  Anesthesia Plan: General   Post-op Pain Management:    Induction: Intravenous  PONV Risk Score and Plan:   Airway Management Planned: Mask  Additional Equipment:   Intra-op Plan:   Post-operative Plan:   Informed Consent: I have reviewed the patients History and Physical, chart, labs and discussed the procedure including the risks, benefits and alternatives for the proposed anesthesia with the patient or authorized representative who has indicated his/her understanding and acceptance.     Dental advisory given  Plan Discussed with: CRNA and Anesthesiologist  Anesthesia Plan Comments:         Anesthesia Quick Evaluation

## 2019-12-24 NOTE — CV Procedure (Signed)
    Cardioversion Note  Glenda [name] [id number] 1924-07-02  Procedure: DC Cardioversion Indications: atrial fibrillation  Procedure Details Consent: Obtained Time Out: Verified patient identification, verified procedure, site/side was marked, verified correct patient position, special equipment/implants available, Radiology Safety Procedures followed,  medications/allergies/relevent history reviewed, required imaging and test results available.  Performed  The patient has been on adequate anticoagulation.  The patient received IV propofol and lidocaine administered by anesthesia staff for sedation.  Synchronous cardioversion was performed at 120 joules.  The cardioversion was successful.   Complications: No apparent complications Patient did tolerate procedure well.   Glenda Dawley, MD, [facility] 12/24/2019, 9:38 AM

## 2019-12-24 NOTE — Anesthesia Postprocedure Evaluation (Signed)
Anesthesia Post Note  Patient: Glenda [name]  Procedure(s) Performed: CARDIOVERSION (N/A )     Patient location during evaluation: Endoscopy Anesthesia Type: General Level of consciousness: awake and alert Pain management: pain level controlled Vital Signs Assessment: post-procedure vital signs reviewed and stable Respiratory status: spontaneous breathing, nonlabored ventilation, respiratory function stable and patient connected to nasal cannula oxygen Cardiovascular status: blood pressure returned to baseline and stable Postop Assessment: no apparent nausea or vomiting Anesthetic complications: no   No complications documented.  Last Vitals:  Vitals:   12/24/19 0944 12/24/19 0947  BP:  140/62  Pulse: 62 62  Resp: (!) 26 (!) 21  Temp:  36.4 C  SpO2: 100% 99%    Last Pain:  Vitals:   12/24/19 0947  TempSrc: Temporal  PainSc: 0-No pain                 Kanchan Gal COKER

## 2019-12-24 NOTE — Interval H&P Note (Signed)
History and Physical Interval Note:  12/24/2019 9:01 AM  Glenda [name]  has presented today for surgery, with the diagnosis of AFIB.  The various methods of treatment have been discussed with the patient and family. After consideration of risks, benefits and other options for treatment, the patient has consented to  Procedure(s): CARDIOVERSION (N/A) as a surgical intervention.  The patient's history has been reviewed, patient examined, no change in status, stable for surgery.  I have reviewed the patient's chart and labs.  Questions were answered to the patient's satisfaction.     Ena Dawley

## 2019-12-25 ENCOUNTER — Encounter (HOSPITAL_COMMUNITY): Payer: Self-pay | Admitting: Cardiology

## 2019-12-31 ENCOUNTER — Other Ambulatory Visit: Payer: Self-pay

## 2019-12-31 ENCOUNTER — Ambulatory Visit (HOSPITAL_COMMUNITY)
Admission: RE | Admit: 2019-12-31 | Discharge: 2019-12-31 | Disposition: A | Discharge status: Home / Self Care | Payer: Medicare Other | Source: Ambulatory Visit | Attending: Physician Assistant | Admitting: Physician Assistant

## 2019-12-31 VITALS — BP 132/60 | HR 58 | Ht 60.0 in | Wt 113.4 lb

## 2019-12-31 DIAGNOSIS — I4891 Unspecified atrial fibrillation: Secondary | ICD-10-CM | POA: Insufficient documentation

## 2019-12-31 DIAGNOSIS — D6869 Other thrombophilia: Secondary | ICD-10-CM | POA: Diagnosis not present

## 2019-12-31 DIAGNOSIS — I495 Sick sinus syndrome: Secondary | ICD-10-CM | POA: Diagnosis not present

## 2019-12-31 DIAGNOSIS — I4892 Unspecified atrial flutter: Secondary | ICD-10-CM | POA: Diagnosis not present

## 2019-12-31 NOTE — Progress Notes (Signed)
Primary Care Physician: Glenda Olp, MD Primary Cardiologist: Gl[name] Primary Electrophysiologist: Gl[name] Referring Physician: Dr Glenda [name] is a 86 y.o. female with a history of atrial flutter, chronic diastolic CHF, HTN, atrial fibrillation who presents for consultation in the [name]. Admitted July 2017 with atrial flutter vs atrial tachycardia and underwent TEE guided cardioversion. Patient had recurrent atrial flutter May 2018 and had repeat cardioversion. Appointment was scheduled to see Glenda. [name] for consideration of ablation,seen byRenee Charlcie [name] and patient was inclined not to pursue procedures. Monitor March 2019 showed sinus with PACs and PVCs but no atrial flutter.Patient had recurrent atrial flutter following hip replacement November 2020. Patient is on Eliquis for a CHADS2VASC score of 4. She was seen by Glenda [name] on 12/18/19 and was in atrial flutter. She is s/p DCCV 12/24/19. She does feel better with more energy and less palpitations although her SOB is only minimally improved.   Today, she denies symptoms of palpitations, chest pain, shortness of breath, orthopnea, PND, lower extremity edema, dizziness, presyncope, syncope, snoring, daytime somnolence, bleeding, or neurologic sequela. The patient is tolerating medications without difficulties and is otherwise without complaint today.    Atrial Fibrillation Risk Factors:  she does not have symptoms or diagnosis of sleep apnea. she does not have a history of rheumatic fever.   she has a BMI of Body mass index is 22.15 kg/m.Marland Kitchen Filed Weights   12/31/19 1330  Weight: 51.4 kg    Family History  Problem Relation Age of Onset  . Heart disease Mother   . Hypertension Father   . Other Sister        22 in 2019  . Other Sister        56 in 2019  . Heart disease Sister   . Heart attack Neg Hx   . Stroke Neg Hx      Atrial Fibrillation Management  history:  Previous antiarrhythmic drugs: none Previous cardioversions: 2017, 2018, 12/24/19 Previous ablations: none CHADS2VASC score: 4 Anticoagulation history: Eliquis    Past Medical History:  Diagnosis Date  . Aortic stenosis    Echo 02/2011 showing mild AS with normal LV systolic function  . Arthritis    "lower back" (10/09/2015)  . Atrial flutter with rapid ventricular response ([name]) 08/19/2016   s/p successful DCCV on 08/20/16, continue eliquis  . Chronic bronchitis (Glenda [name])    "off and on; several years" (10/09/2015)  . Chronic diastolic CHF (congestive heart failure) (Gl[name])    a. 12/2015 Echo: EF 55-60%, mild AI/MR, mildly dil LA.  Marland Kitchen Chronic lower back pain   . Complication of anesthesia    "they had trouble waking me up after colon resection"  . Confusion    Originally listed as TIA-pt denies this hx on 10/09/2015 "it was the [place] I was taking; they had thought I was having a st originally roke"  . DDD (degenerative disc disease), lumbar    severe facet dz and adv DDD MRI L spine 2009  . Diverticulosis   . Dysrhythmia    A-Fib  . Femur fracture, right ([name]) 10/18/2018  . GERD (gastroesophageal reflux disease)   . Hiatal hernia    hx  . Hypercholesterolemia   . Hypertension   . Insomnia   . OA (osteoarthritis) of knee   . Paroxysmal atrial flutter (Glenda [name])    a. 09/2015 s/p TEE/DCCV;  b. CHA2DS2VASc = 5-->Eliquis.  . Peripheral edema   . PVCs (premature  ventricular contractions)   . Sacral fracture (Glenda [name]) 11/06/2012  . Vertigo 11/19/2012   Past Surgical History:  Procedure Laterality Date  . BREAST BIOPSY Left   . CARDIAC CATHETERIZATION  02/17/2001   MILD REGURGITATION. EF 60%  . CARDIOVERSION N/A 10/08/2015   Procedure: CARDIOVERSION;  Surgeon: Glenda Klein, MD;  Location: [name];  Service: Cardiovascular;  Laterality: N/A;  . CARDIOVERSION N/A 08/20/2016   Procedure: Cardioversion;  Surgeon: Glenda Lance, MD;  Location: [name];  Service:  Cardiovascular;  Laterality: N/A;  . CARDIOVERSION N/A 12/24/2019   Procedure: CARDIOVERSION;  Surgeon: Glenda Spark, MD;  Location: [name];  Service: Cardiovascular;  Laterality: N/A;  . CATARACT EXTRACTION W/ INTRAOCULAR LENS  IMPLANT, BILATERAL Bilateral   . [place]  . COLECTOMY     related to "blockage"  . COLONOSCOPY    . EXCISIONAL HEMORRHOIDECTOMY    . HIP PINNING,CANNULATED Right 10/19/2018   Procedure: Right percutaneous hip pinning;  Surgeon: Erle Crocker, MD;  Location: [place];  Service: Orthopedics;  Laterality: Right;  . INCISIONAL HERNIA REPAIR N/A 06/07/2019   Procedure: Reduction and Repair of Incisional Hernia with Overlay Mesh;  Surgeon: Kinsinger, Arta Bruce, MD;  Location: [place];  Service: General;  Laterality: N/A;  . INGUINAL HERNIA REPAIR Bilateral   . KYPHOPLASTY Right 09/11/2012   Procedure: Right Acrylic Sacroplasty;  Surgeon: Kristeen Miss, MD;  Location: [name];  Service: Neurosurgery;  Laterality: Right;  Right  Acrylic Sacroplasty  . TEE WITHOUT CARDIOVERSION N/A 10/08/2015   Procedure: TRANSESOPHAGEAL ECHOCARDIOGRAM (TEE);  Surgeon: Glenda Klein, MD;  Location: [place];  Service: Cardiovascular;  Laterality: N/A;  . TOTAL HIP ARTHROPLASTY Right 02/05/2019   Procedure: [place] TOTAL HIP ARTHROPLASTY ANTERIOR APPROACH AND REMOVAL OF SCREWS;  Surgeon: Frederik Pear, MD;  Location: [name];  Service: Orthopedics;  Laterality: Right;  . TOTAL KNEE ARTHROPLASTY Right   . TOTAL MASTECTOMY Left 07/22/2017   Procedure: TOTAL MASTECTOMY;  Surgeon: Excell Seltzer, MD;  Location: [name];  Service: General;  Laterality: Left;  . UMBILICAL HERNIA REPAIR      Current Outpatient Medications  Medication Sig Dispense Refill  . amLODipine (NORVASC) 5 MG tablet Take 1 tablet (5 mg total) by mouth daily. 90 tablet 3  . anastrozole (ARIMIDEX) 1 MG tablet Take 1 tablet (1 mg total) by mouth daily. 90 tablet 3  . apixaban (ELIQUIS) 2.5 MG TABS  tablet Take 1 tablet (2.5 mg total) by mouth 2 (two) times daily. 60 tablet 4  . Cholecalciferol 25 MCG (1000 UT) tablet Take 1 tablet (1,000 Units total) by mouth daily. 90 tablet 3  . Cranberry 425 MG CAPS Take 425 mg by mouth 2 (two) times daily.    Marland Kitchen docusate sodium (COLACE) 100 MG capsule Take 100 mg by mouth 2 (two) times daily.    . furosemide (LASIX) 20 MG tablet Take 1.5 tablets (30 mg total) by mouth daily. 45 tablet 3  . HYDROcodone-acetaminophen (NORCO/VICODIN) 5-325 MG tablet TAKE (1) TABLET BY MOUTH TWICE DAILY AT 9AM AND 3PM. SEPARATE BY 6 HOURS FROM TEMAZEPAM. (Patient taking differently: Take 1 tablet by mouth in the morning and at bedtime. 900 & 1500 Seperated by 6 hours from Temazepam) 60 tablet 0  . Multiple Vitamins-Minerals (CEROVITE ADVANCED FORMULA PO) Take 1 tablet by mouth daily. (0800)    . omeprazole (PRILOSEC) 40 MG capsule Take 40 mg by mouth daily.    . ondansetron (ZOFRAN) 4 MG tablet Take  4 mg by mouth every 6 (six) hours as needed for nausea.    . polyethylene glycol (MIRALAX / GLYCOLAX) packet Take 17 g by mouth See admin instructions. Mix 17 grams in 8 ounces of juice/water and drink daily, Monday through Saturday    . sertraline (ZOLOFT) 25 MG tablet Take 1 tablet (25 mg total) by mouth daily. 90 tablet 3  . temazepam (RESTORIL) 7.5 MG capsule Take 1 capsule (7.5 mg total) by mouth at bedtime as needed for sleep. 30 capsule 5  . tiZANidine (ZANAFLEX) 2 MG tablet Take 2 mg by mouth every 6 (six) hours as needed for muscle spasms.     No current facility-administered medications for this encounter.    Allergies  Allergen Reactions  . Ace Inhibitors Other (See Comments)    Cough  . Halcion [Triazolam]     UNSPECIFIED REACTION   . Pentazocine Lactate     UNSPECIFIED REACTION   . Sulfa Drugs Cross Reactors     UNSPECIFIED REACTION   . Trazodone And Nefazodone Other (See Comments)    Per MAR  . Amitriptyline Other (See Comments)    Hyperactivity  .  Avelox [Moxifloxacin Hcl In Nacl] Other (See Comments)    dizziness  . Penicillins Rash    Social History   Socioeconomic History  . Marital status: Widowed    Spouse name: Not on file  . Number of children: Not on file  . Years of education: Not on file  . Highest education level: Not on file  Occupational History  . Not on file  Tobacco Use  . Smoking status: Never Smoker  . Smokeless tobacco: Never Used  Vaping Use  . Vaping Use: Never used  Substance and Sexual Activity  . Alcohol use: No  . Drug use: No  . Sexual activity: Never  Other Topics Concern  . 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 Difficulty of Paying Living Expenses: Not on file  Food Insecurity:   . Worried About Charity fundraiser in the Last Year: Not on file  . Ran Out of Food in the Last Year: Not on file  Transportation Needs:   . Lack of Transportation (Medical): Not on file  . Lack of Transportation (Non-Medical): Not on file  Physical Activity:   . Days of Exercise per Week: Not on file  . Minutes of Exercise per Session: Not on file  Stress:   . Feeling of Stress : Not on file  Social Connections:   . Frequency of Communication with Friends and Family: Not on file  . Frequency of Social Gatherings with Friends and Family: Not on file  . Attends Religious Services: Not on file  . Active Member of Clubs or Organizations: Not on file  . Attends Archivist Meetings: Not on file  . Marital Status: Not on file  Intimate Partner Violence:   . Fear of Current or Ex-Partner: Not on file  .  Emotionally Abused: Not on file  .  Physically Abused: Not on file  . Sexually Abused: Not on file     ROS- All systems are reviewed and negative except as per the HPI above.  Physical Exam: Vitals:   12/31/19 1330  BP: 132/60  Pulse: (!) 58  Weight: 51.4 kg  Height: 5' (1.524 m)    GEN- The patient is well appearing elderly female, alert and oriented x 3 today.   Head- normocephalic, atraumatic Eyes-  Sclera clear, conjunctiva pink Ears- hearing intact Oropharynx- clear Neck- supple  Lungs- Clear to ausculation bilaterally, normal work of breathing Heart- Regular rate and rhythm, occasional ectopic beat, no murmurs, rubs or gallops  GI- soft, NT, ND, + BS Extremities- no clubbing, cyanosis, or edema MS- no significant deformity or atrophy Skin- no rash or lesion Psych- euthymic mood, full affect Neuro- strength and sensation are intact  Wt Readings from Last 3 Encounters:  12/31/19 51.4 kg  12/18/19 52.1 kg  10/22/19 50.1 kg    EKG today demonstrates SB HR 58, PACs, QRS 88, QTc 459  Echo 05/15/19 demonstrated  1. Left ventricular ejection fraction, by estimation, is 60 to 65%. The  left ventricle has normal function. The left ventricle has no regional  wall motion abnormalities. There is mild asymmetric left ventricular  hypertrophy of the septal segment. Left  ventricular diastolic parameters are indeterminate.  2. Right ventricular systolic function is normal. The right ventricular  size is normal. There is normal pulmonary artery systolic pressure.  3. Left atrial size was mildly dilated.  4. The mitral valve is normal in structure and function. Mild mitral  valve regurgitation. No evidence of mitral stenosis.  5. The aortic valve is tricuspid. Aortic valve regurgitation is mild. No  aortic stenosis is present.  6. The inferior vena cava is normal in size with greater than 50%  respiratory variability, suggesting right atrial pressure of 3 mmHg.  7. Side  by side comparisons were done with echo 01/2019 showing no  significant changes.   Epic records are reviewed at length today  CHA2DS2-VASc Score = 4  The patient's score is based upon: CHF History: 0 HTN History: 1 Diabetes History: 0 Stroke History: 0 Vascular Disease History: 0 Age Score: 2 Gender Score: 1      ASSESSMENT AND PLAN: 1. Paroxysmal Atrial Fibrillation/atrial flutter The patient's CHA2DS2-VASc score is 4, indicating a 4.8% annual risk of stroke.   S/p DCCV on 12/24/19 We discussed therapeutic option today. Agree with Glenda [name] that h/o bradycardia limits therapy. Concern for rapid flutter on class 1C drugs if unable to use AV node agents.  ? Tachybrady syndrome. We discussed possibility of PPM today. She is agreeable to discussing with EP. She has seen Glenda [name] previously.   2. Secondary Hypercoagulable State (ICD10:  D68.69) The patient is at significant risk for stroke/thromboembolism based upon her CHA2DS2-VASc Score of 4.  Continue Apixaban (Eliquis).   3. HTN Stable, no changes today.   Follow up with Glenda [name].   [name] [address] [place], [place] [address] [phone] 12/31/2019 4:51 PM

## 2020-01-04 ENCOUNTER — Telehealth: Payer: Self-pay

## 2020-01-04 NOTE — Telephone Encounter (Signed)
Spoke with Davy Pique from [facility] and she stated that she would talk to her supervisors and the patient to figure out what the next steps should be. She stated that she would give our office a call once a decision has been made. No other questions or concerns at this time.

## 2020-01-04 NOTE — Telephone Encounter (Signed)
With patient already being on hydrocodone and temazepam I do not think adding a muscle relaxant at her age is ideal.  This would also increase her fall risk.  I had mentioned when they wrote about this that she would need an office visit if pain was not controlled-perhaps we can set her up with Saturday clinic?  Or sports medicine for early next week?

## 2020-01-04 NOTE — Telephone Encounter (Signed)
See below

## 2020-01-04 NOTE — Telephone Encounter (Signed)
Pt fell on 12/27/2019 and hurt her lower back. Pt is taking pain medication but it is not helping much. Pt wanted to know if Dr. Yong Channel could prescribe her a muscle relaxer to help with the pain. Please advise.   This was a call from [place] at [facility].

## 2020-01-08 ENCOUNTER — Other Ambulatory Visit: Payer: Self-pay | Admitting: Family Medicine

## 2020-01-08 ENCOUNTER — Encounter: Payer: Self-pay | Admitting: Family Medicine

## 2020-01-08 ENCOUNTER — Other Ambulatory Visit: Payer: Self-pay

## 2020-01-08 ENCOUNTER — Ambulatory Visit (INDEPENDENT_AMBULATORY_CARE_PROVIDER_SITE_OTHER): Payer: Medicare Other | Admitting: Family Medicine

## 2020-01-08 ENCOUNTER — Ambulatory Visit (INDEPENDENT_AMBULATORY_CARE_PROVIDER_SITE_OTHER): Payer: Medicare Other

## 2020-01-08 VITALS — BP 160/80 | HR 87 | Ht 60.0 in

## 2020-01-08 DIAGNOSIS — S22000A Wedge compression fracture of unspecified thoracic vertebra, initial encounter for closed fracture: Secondary | ICD-10-CM | POA: Diagnosis not present

## 2020-01-08 DIAGNOSIS — M545 Low back pain, unspecified: Secondary | ICD-10-CM | POA: Diagnosis not present

## 2020-01-08 NOTE — Telephone Encounter (Signed)
She is supposed to do 3 month follow up for these refills. Please schedule her for next available- I will fill her prescription to get her to visit

## 2020-01-08 NOTE — Telephone Encounter (Signed)
Please call and schedule pt 3 month f/u to continue to get refills.

## 2020-01-08 NOTE — Progress Notes (Signed)
Subjective:    CC: Low back pain  I, Judy Pimple, am serving as a Education administrator for Dr. Lynne Leader.  HPI: Pt is a 86 y/o female presenting w/ c/o LBP after suffering a fall on 12/27/19.  She locates her pain to Mid back. Was trying to get out of bed and she tripped and fell. Fell on Left side Causing bruising on her Left arm and the pain across her mid back. Patient is having trouble staying asleep. Patient cannot walk without any help.   Radiating pain: no   Aggravating factors: walking; siting;  Treatments tried: hydrocodone-acetaminophen; temazepam;    [facility]. Driver available  M,T,Th  Pertinent review of Systems: No fevers or chills  Relevant historical information: Chronic pain.  Hypertension.  Heart failure.   Objective:    Vitals:   01/08/20 1308  BP: (!) 160/80  Pulse: 87  SpO2: 96%   General: Well Developed, well nourished, and in no acute distress.   MSK: T-spine normal-appearing tender palpation midportion T-spine midline. L-spine normal-appearing mildly tender palpation midline lower portion of lumbar spine. Decreased motion spine T-spine and L-spine.  Lower extremity strength is intact.  Sensation intact distal.  Lab and Radiology Results  X-ray images T-spine and L-spine obtained today personally independently interpreted.  T-spine: Significant compression fracture deformity present and T-spine at T8.  No other acute fractures visible.  L-spine: No acute lumbar DDD and facet DJD present lower portion of L-spine.  Evidence of prior sacral vertebroplasty right sacrum visible.  Await formal radiology review   Impression and Recommendations:    Assessment and Plan: 86 y.o. female with acute pain spine after a fall occurring about 2 weeks ago.  Patient has point tenderness along the midportion of her thoracic spine and near the thoracic lumbar junction.  X-ray evaluation today shows significant compression fracture at T-spine  around T8..  This was not seen on previous imaging per my understanding.  Plan for CT scan of the thoracic spine to further evaluate compression fracture and for vertebroplasty or kyphoplasty planning.  Plan to continue existing hydrocodone for pain control.  PDMP reviewed during this encounter. Orders Placed This Encounter  Procedures  . DG Lumbar Spine 2-3 Views    Standing Status:   Future    Number of Occurrences:   1    Standing Expiration Date:   01/07/2021    Order Specific Question:   Reason for Exam (SYMPTOM  OR DIAGNOSIS REQUIRED)    Answer:   eval low back pain after fall    Order Specific Question:   Preferred imaging location?    Answer:   [facility]  . DG Thoracic Spine 2 View    Standing Status:   Future    Number of Occurrences:   1    Standing Expiration Date:   01/07/2021    Order Specific Question:   Reason for Exam (SYMPTOM  OR DIAGNOSIS REQUIRED)    Answer:   eval pain tspine    Order Specific Question:   Preferred imaging location?    Answer:   [facility]  . CT THORACIC SPINE WO CONTRAST    Standing Status:   Future    Standing Expiration Date:   01/07/2021    Order Specific Question:   Preferred imaging location?    Answer:   [facility]   No orders of the defined types were placed in this encounter.   Discussed warning signs or symptoms. Please  see discharge instructions. Patient expresses understanding.   The above documentation has been reviewed and is accurate and complete Lynne Leader, M.D.

## 2020-01-08 NOTE — Patient Instructions (Addendum)
Thank you for coming in today.  Plan for CT scan of the thoracic spine for evaluate for a compression fracture.  You should hear soon about the CT scan.   If compression fracture seen plan for Kyphopalsty or Vertibroplasty.    Balloon Kyphoplasty Balloon kyphoplasty is a procedure to treat a spinal compression fracture, which is a collapse of the bones that form the spine (vertebrae). With this type of fracture, the vertebrae are squeezed (compressed) into a wedge shape, and this causes pain. In this procedure, the collapsed vertebrae are expanded with a balloon, and bone cement is injected into the vertebrae to strengthen them. Tell a health care provider about:  Any allergies you have.  All medicines you are taking, including vitamins, herbs, eye drops, creams, and over-the-counter medicines.  Any problems you or family members have had with anesthetic medicines.  Any blood disorders you have.  Any surgeries you have had.  Any medical conditions you have or have had.  Whether you are pregnant or may be pregnant. What are the risks? Generally, this is a safe procedure. However, problems may occur, including:  Infection.  Bleeding.  Increased back pain.  Allergic reactions to medicines.  Damage to other structures, nerves, or organs.  Leaking of bone cement into other parts of the body. What happens before the procedure? Medicines Ask your health care provider about:  Changing or stopping your regular medicines. This is especially important if you are taking diabetes medicines or blood thinners.  Taking medicines such as aspirin and ibuprofen. These medicines can thin your blood. Do not take these medicines unless your health care provider tells you to take them.  Taking over-the-counter medicines, vitamins, herbs, and supplements. General instructions  Follow instructions from your health care provider about eating or drinking restrictions.  Do not use any  products that contain nicotine or tobacco for at least 4 weeks before the procedure. These products include cigarettes, e-cigarettes, and chewing tobacco. If you need help quitting, ask your health care provider.  Ask your health care provider: ? How your surgery site will be marked. ? What steps will be taken to help prevent infection. These may include:  Removing hair at the surgery site.  Washing skin with a germ-killing soap.  Taking antibiotic medicine.  Plan to have someone take you home from the hospital or clinic.  If you will be going home right after the procedure, plan to have someone with you for 24 hours. What happens during the procedure?   An IV will be inserted into one of your veins.  You will be given one or more of the following: ? A medicine to help you relax (sedative). ? A medicine to numb the area (local anesthetic). ? A medicine to make you fall asleep (general anesthetic).  Your surgeon will use an X-ray machine to see your spinal compression fracture.  A hollow needle will be inserted through the skin and into the spine.  Through this needle, the balloon will be placed in your spine where the fractures are located.  The balloon will be inflated. This will create space and push the bone back toward its normal height and shape.  The balloon will be removed.  The newly created space in your spine will be filled with bone cement.  When the cement hardens, the hollow needle will be removed.  The needle puncture site will be closed with skin glue or adhesive tape.  A bandage (dressing) may be used to cover the  needle puncture site. The procedure may vary among health care providers and hospitals. What happens after the procedure?  Your blood pressure, heart rate, breathing rate, and blood oxygen level will be monitored until you leave the hospital or clinic.  You will have some pain. Pain medicine will be available to help you.  An ice pack may be  placed over the needle site.  Do not drive for 24 hours if you were given a sedative during your procedure. Summary  Balloon kyphoplasty is a procedure to treat a spinal compression fracture, which is a collapse of the bones that form the spine (vertebrae).  Before the procedure, follow instructions from your health care provider about eating or drinking restrictions.  Ask your health care provider about changing or stopping your regular medicines. This is especially important if you are taking diabetes medicines or blood thinners.  Plan to have someone take you home from the hospital or clinic. Do not drive for 24 hours if you were given a sedative during your procedure.  If you will be going home right after the procedure, plan to have someone with you for 24 hours. This information is not intended to replace advice given to you by your health care provider. Make sure you discuss any questions you have with your health care provider. Document Revised: 02/13/2018 Document Reviewed: 02/13/2018 Elsevier Patient Education  2020 [employer].

## 2020-01-09 ENCOUNTER — Telehealth: Payer: Self-pay | Admitting: Family Medicine

## 2020-01-09 DIAGNOSIS — M545 Low back pain, unspecified: Secondary | ICD-10-CM

## 2020-01-09 DIAGNOSIS — S22000A Wedge compression fracture of unspecified thoracic vertebra, initial encounter for closed fracture: Secondary | ICD-10-CM

## 2020-01-09 NOTE — Progress Notes (Signed)
X-ray lumbar spine shows no new fractures or significant changes.  You do have existing back arthritis and scoliosis which is not new.

## 2020-01-09 NOTE — Progress Notes (Signed)
X-ray thoracic spine shows a new T7 compression fracture.  This appears to be new since CT scan of the chest from November 2020.  Plan for CT scan as already ordered to further evaluate the new compression fracture.

## 2020-01-09 NOTE — Telephone Encounter (Signed)
.   LAST APPOINTMENT DATE: 01/04/2020   NEXT APPOINTMENT DATE:@Visit  date not found  MEDICATION:HYDROcodone-acetaminophen (NORCO/VICODIN) 5-325 MG tablet  PHARMACY:[facility] - [place], [place] - [place]  **Let patient know to contact pharmacy at the end of the day to make sure medication is ready. **  ** Please notify patient to allow 48-72 hours to process**  **Encourage patient to contact the pharmacy for refills or they can request refills through [facility]**  CLINICAL FILLS OUT ALL BELOW:   LAST REFILL:  QTY:  REFILL DATE:    OTHER COMMENTS:    Okay for refill?  Please advise

## 2020-01-09 NOTE — Telephone Encounter (Signed)
Pt scheduled  

## 2020-01-09 NOTE — Telephone Encounter (Signed)
[facility] called, as patient recd a message from [name] that we were working on a CT order. They can only transport on Mondays,Tuesdays, and Thursdays. FYI for scheduling purposes, I could not figure out how to note this in the actual order.

## 2020-01-09 NOTE — Telephone Encounter (Signed)
Bonnita Nasuti wanted to know if you  realize that patient will need to be transported from home in [place] to [place]? She has had most of her priors at [facility].   If that is all fine with patient and you she will go ahead and call patient to schedule.

## 2020-01-09 NOTE — Telephone Encounter (Signed)
Patient has been scheduled for November 4th.

## 2020-01-10 NOTE — Telephone Encounter (Signed)
CT scan thoracic spine ordered for [facility].  Try to avoid the hospital location for Covid risk.

## 2020-01-10 NOTE — Telephone Encounter (Signed)
Changed location to [facility] and have sent a message to them about transportation

## 2020-01-11 NOTE — Progress Notes (Signed)
[facility] Note  01/15/2020     CHIEF COMPLAINT Patient presents for Retina Follow Up   HISTORY OF PRESENT ILLNESS: Glenda [name] is a 86 y.o. female who presents to the clinic today for:   HPI    Retina Follow Up    Patient presents with  Dry AMD.  In both eyes.  Severity is moderate.  Duration of 8 weeks.  Since onset it is stable.  I, the attending physician,  performed the HPI with the patient and updated documentation appropriately.          Comments    Patient states vision slightly worse OU. Some days patient sees better than others.        Last edited by Bernarda Caffey, MD on 01/15/2020  1:13 PM. (History)    pt states her vision fluctuates day to day, she feels like her right eye is worse today than last time she was here, she states faces are distorted when she watches TV  Referring physician: Marin Olp, MD [place],  [place] [id number]  HISTORICAL INFORMATION:   Selected notes from the [facility] Referred for ret eval OU   CURRENT MEDICATIONS: No current outpatient medications on file. (Ophthalmic Drugs)   No current facility-administered medications for this visit. (Ophthalmic Drugs)   Current Outpatient Medications (Other)  Medication Sig  . amLODipine (NORVASC) 5 MG tablet Take 1 tablet (5 mg total) by mouth daily.  Marland Kitchen anastrozole (ARIMIDEX) 1 MG tablet Take 1 tablet (1 mg total) by mouth daily.  Marland Kitchen apixaban (ELIQUIS) 2.5 MG TABS tablet Take 1 tablet (2.5 mg total) by mouth 2 (two) times daily.  . Cholecalciferol 25 MCG (1000 UT) tablet Take 1 tablet (1,000 Units total) by mouth daily.  . Cranberry 425 MG CAPS Take 425 mg by mouth 2 (two) times daily.  Marland Kitchen docusate sodium (COLACE) 100 MG capsule Take 100 mg by mouth 2 (two) times daily.  . furosemide (LASIX) 20 MG tablet Take 1.5 tablets (30 mg total) by mouth daily.  Marland Kitchen HYDROcodone-acetaminophen (NORCO/VICODIN) 5-325 MG tablet TAKE (1) TABLET  BY MOUTH TWICE DAILY AT 9AM AND 3PM. SEPARATE BY 6 HOURS FROM TEMAZEPAM.  . Multiple Vitamins-Minerals (CEROVITE ADVANCED FORMULA PO) Take 1 tablet by mouth daily. (0800)  . omeprazole (PRILOSEC) 40 MG capsule Take 40 mg by mouth daily.  . ondansetron (ZOFRAN) 4 MG tablet Take 4 mg by mouth every 6 (six) hours as needed for nausea.  . polyethylene glycol (MIRALAX / GLYCOLAX) packet Take 17 g by mouth See admin instructions. Mix 17 grams in 8 ounces of juice/water and drink daily, Monday through Saturday  . sertraline (ZOLOFT) 25 MG tablet Take 1 tablet (25 mg total) by mouth daily.  . temazepam (RESTORIL) 7.5 MG capsule Take 1 capsule (7.5 mg total) by mouth at bedtime as needed for sleep.  Marland Kitchen tiZANidine (ZANAFLEX) 2 MG tablet Take 2 mg by mouth every 6 (six) hours as needed for muscle spasms.   No current facility-administered medications for this visit. (Other)      REVIEW OF SYSTEMS: ROS    Positive for: Musculoskeletal, HENT, Cardiovascular, Eyes, Psychiatric   Negative for: Constitutional, Gastrointestinal, Neurological, Skin, Genitourinary, Endocrine, Respiratory, Allergic/Imm, Heme/Lymph   Last edited by Roselee Nova D, COT on 01/15/2020 12:48 PM. (History)       ALLERGIES Allergies  Allergen Reactions  . Ace Inhibitors Other (See Comments)    Cough  . Halcion [  Triazolam]     UNSPECIFIED REACTION   . Pentazocine Lactate     UNSPECIFIED REACTION   . Sulfa Drugs Cross Reactors     UNSPECIFIED REACTION   . Trazodone And Nefazodone Other (See Comments)    Per MAR  . Amitriptyline Other (See Comments)    Hyperactivity  . Avelox [Moxifloxacin Hcl In Nacl] Other (See Comments)    dizziness  . Penicillins Rash    PAST MEDICAL HISTORY Past Medical History:  Diagnosis Date  . Aortic stenosis    Echo 02/2011 showing mild AS with normal LV systolic function  . Arthritis    "lower back" (10/09/2015)  . Atrial flutter with rapid ventricular response ([place]) 08/19/2016   s/p  successful DCCV on 08/20/16, continue eliquis  . Chronic bronchitis ([place])    "off and on; several years" (10/09/2015)  . Chronic diastolic CHF (congestive heart failure) ([place])    a. 12/2015 Echo: EF 55-60%, mild AI/MR, mildly dil LA.  Marland Kitchen Chronic lower back pain   . Complication of anesthesia    "they had trouble waking me up after colon resection"  . Confusion    Originally listed as TIA-pt denies this hx on 10/09/2015 "it was the [place] I was taking; they had thought I was having a st originally roke"  . DDD (degenerative disc disease), lumbar    severe facet dz and adv DDD MRI L spine 2009  . Diverticulosis   . Dysrhythmia    A-Fib  . Femur fracture, right ([place]) 10/18/2018  . GERD (gastroesophageal reflux disease)   . Hiatal hernia    hx  . Hypercholesterolemia   . Hypertension   . Insomnia   . OA (osteoarthritis) of knee   . Paroxysmal atrial flutter ([place])    a. 09/2015 s/p TEE/DCCV;  b. CHA2DS2VASc = 5-->Eliquis.  . Peripheral edema   . PVCs (premature ventricular contractions)   . Sacral fracture ([place]) 11/06/2012  . Vertigo 11/19/2012   Past Surgical History:  Procedure Laterality Date  . BREAST BIOPSY Left   . CARDIAC CATHETERIZATION  02/17/2001   MILD REGURGITATION. EF 60%  . CARDIOVERSION N/A 10/08/2015   Procedure: CARDIOVERSION;  Surgeon: Sanda Klein, MD;  Location: [facility];  Service: Cardiovascular;  Laterality: N/A;  . CARDIOVERSION N/A 08/20/2016   Procedure: Cardioversion;  Surgeon: Evans Lance, MD;  Location: [facility];  Service: Cardiovascular;  Laterality: N/A;  . CARDIOVERSION N/A 12/24/2019   Procedure: CARDIOVERSION;  Surgeon: Dorothy Spark, MD;  Location: [facility];  Service: Cardiovascular;  Laterality: N/A;  . CATARACT EXTRACTION W/ INTRAOCULAR LENS  IMPLANT, BILATERAL Bilateral   . [place]  . COLECTOMY     related to "blockage"  . COLONOSCOPY    . EXCISIONAL HEMORRHOIDECTOMY    . HIP PINNING,CANNULATED Right  10/19/2018   Procedure: Right percutaneous hip pinning;  Surgeon: Erle Crocker, MD;  Location: [facility];  Service: Orthopedics;  Laterality: Right;  . INCISIONAL HERNIA REPAIR N/A 06/07/2019   Procedure: Reduction and Repair of Incisional Hernia with Overlay Mesh;  Surgeon: Kinsinger, Arta Bruce, MD;  Location: [facility];  Service: General;  Laterality: N/A;  . INGUINAL HERNIA REPAIR Bilateral   . KYPHOPLASTY Right 09/11/2012   Procedure: Right Acrylic Sacroplasty;  Surgeon: Kristeen Miss, MD;  Location: [facility];  Service: Neurosurgery;  Laterality: Right;  Right  Acrylic Sacroplasty  . TEE WITHOUT CARDIOVERSION N/A 10/08/2015   Procedure: TRANSESOPHAGEAL ECHOCARDIOGRAM (TEE);  Surgeon: Sanda Klein, MD;  Location: [facility];  Service: Cardiovascular;  Laterality: N/A;  . TOTAL HIP ARTHROPLASTY Right 02/05/2019   Procedure: RIGH TOTAL HIP ARTHROPLASTY ANTERIOR APPROACH AND REMOVAL OF SCREWS;  Surgeon: Gean Birchwood, MD;  Location: [facility];  Service: Orthopedics;  Laterality: Right;  . TOTAL KNEE ARTHROPLASTY Right   . TOTAL MASTECTOMY Left 07/22/2017   Procedure: TOTAL MASTECTOMY;  Surgeon: Glenna Fellows, MD;  Location: [facility];  Service: General;  Laterality: Left;  . UMBILICAL HERNIA REPAIR      FAMILY HISTORY Family History  Problem Relation Age of Onset  . Heart disease Mother   . Hypertension Father   . Other Sister        19 in 2019  . Other Sister        65 in 2019  . Heart disease Sister   . Heart attack Neg Hx   . Stroke Neg Hx     SOCIAL HISTORY Social History   Tobacco Use  . Smoking status: Never Smoker  . Smokeless tobacco: Never Used  Vaping Use  . Vaping Use: Never used  Substance Use Topics  . Alcohol use: No  . Drug use: No         OPHTHALMIC EXAM:  Base Eye Exam    Visual Acuity (Snellen - Linear)      Right Left   Dist cc 20/30 -2 20/80 -2   Dist ph cc NI 20/70 -2       Tonometry (Tonopen, 12:58 PM)      Right Left   Pressure 14 16        Pupils      Dark Light Shape React APD   Right 3 2 Round Brisk None   Left 3 2 Round Brisk None       Visual Fields (Counting fingers)      Left Right    Full Full       Extraocular Movement      Right Left    Full, Ortho Full, Ortho       Neuro/Psych    Oriented x3: Yes   Mood/Affect: Normal       Dilation    Both eyes: 1.0% Mydriacyl, 2.5% Phenylephrine @ 12:58 PM        Slit Lamp and Fundus Exam    Slit Lamp Exam      Right Left   Lids/Lashes Dermatochalasis - upper lid, mild Meibomian gland dysfunction Dermatochalasis - upper lid, mild Meibomian gland dysfunction   Conjunctiva/Sclera White and quiet White and quiet   Cornea arcus, well healed temporal cataract wounds, 1+Punctate epithelial erosions arcus, well healed temporal cataract wounds, 1+Punctate epithelial erosions   Anterior Chamber Deep and quiet Deep and quiet   Iris Round and dilated Round and dilated   Lens 3 piece Posterior chamber intraocular lens with open PC, anterior capsular phimosis 3 piece Posterior chamber intraocular lens, mild anterior capsular phimosis, open PC   Vitreous Vitreous syneresis Vitreous syneresis, Posterior vitreous detachment       Fundus Exam      Right Left   Disc mild Pallor, Sharp rim, Compact, +Peripapillary atrophy mild Pallor, Sharp rim, temporal PPA   C/D Ratio 0.2 0.1   Macula Flat, Blunted foveal reflex, hard Drusen, RPE mottling, clumping and atrophy, punctate IRH superior macula - persistent Blunted foveal reflex, Drusen, RPE mottling, clumping and atrophy, +PED, no heme   Vessels Vascular attenuation, mild tortuousity Vascular attenuation, mild tortuousity   Periphery Attached, pigmented CR atrophy at  0500 midzone, scattered peripheral and midzonal drusen, mild reticular degeneration, No heme  Attached, pigmented CR atrophy at 0500 midzone, scattered peripheral and midzonal drusen, mild reticular degeneration, No heme         Refraction    Wearing Rx       Sphere Cylinder Axis Add   Right [place] +0.50 051 +3.00   Left -1.50 +1.00 107 +3.00       Manifest Refraction      Sphere Cylinder Axis Dist VA   Right +0.50 +0.50 052 20/30+2   Left -1.00 +1.00 110 20/60-2          IMAGING AND PROCEDURES  Imaging and Procedures for 01/15/2020  OCT, Retina - OU - Both Eyes       Right Eye Quality was good. Central Foveal Thickness: 294. Progression has been stable. Findings include normal foveal contour, no IRF, retinal drusen , no SRF, pigment epithelial detachment (Shallow PED).   Left Eye Quality was good. Central Foveal Thickness: 283. Progression has been stable. Findings include normal foveal contour, no IRF, retinal drusen , outer retinal atrophy, pigment epithelial detachment, intraretinal hyper-reflective material, no SRF (Persistent central PEDs).   Notes *Images captured and stored on drive  Diagnosis / Impression:  NFP; no IRF OU +drusen/PED OU Nonexudative ARMD OU   Clinical management:  See below  Abbreviations: NFP - Normal foveal profile. CME - cystoid macular edema. PED - pigment epithelial detachment. IRF - intraretinal fluid. SRF - subretinal fluid. EZ - ellipsoid zone. ERM - epiretinal membrane. ORA - outer retinal atrophy. ORT - outer retinal tubulation. SRHM - subretinal hyper-reflective material. IRHM - intraretinal hyper-reflective material                 ASSESSMENT/PLAN:    ICD-10-CM   1. Intermediate stage nonexudative age-related macular degeneration of both eyes  H35.3132   2. Retinal edema  H35.81 OCT, Retina - OU - Both Eyes  3. Essential hypertension  I10   4. Hypertensive retinopathy of both eyes  H35.033   5. Pseudophakia, both eyes  Z96.1   6. Dry eyes  H04.123     1,2. Age related macular degeneration, non-exudative, OU (OS > OD)  - early RPE atrophy OS  - today, exam and OCT stable drusen and PEDs OU  - OD stable on exam and OCT  - BCVA OD 20/30 (decreased), OS 20/70 (decreased from  20/60)  - f/u 3-4 months, sooner prn DFE, OCT  3,4. Hypertensive retinopathy OU - discussed importance of tight BP control - monitor  5. Pseudophakia OU  - s/p CE/IOL OU  - IOL in good position, doing well  - monitor  6. Dry eyes OU  - recommend artificial tears and lubricating ointment as needed    Ophthalmic Meds Ordered this visit:  No orders of the defined types were placed in this encounter.      Return for f/u 3-4 months, non-exu ARMD OU, DFE, OCT.  There are no Patient Instructions on file for this visit.   Explained the diagnoses, plan, and follow up with the patient and they expressed understanding.  Patient expressed understanding of the importance of proper follow up care.  This document serves as a record of services personally performed by Gardiner Sleeper, MD, PhD. It was created on their behalf by Estill Bakes, COT an ophthalmic technician. The creation of this record is the provider's dictation and/or activities during the visit.    Electronically signed by: Estill Bakes, COT  10.22.21 @ 1:44 PM   This document serves as a record of services personally performed by Gardiner Sleeper, MD, PhD. It was created on their behalf by San Jetty. Owens Shark, OA an ophthalmic technician. The creation of this record is the provider's dictation and/or activities during the visit.    Electronically signed by: San Jetty. Owens Shark, [place] 10.26.2021 1:44 PM  Gardiner Sleeper, M.D., Ph.D. Diseases & Surgery of the Retina and [place] 01/15/2020   I have reviewed the above documentation for accuracy and completeness, and I agree with the above. Gardiner Sleeper, M.D., Ph.D. 01/15/20 1:44 PM   Abbreviations: M myopia (nearsighted); A astigmatism; H hyperopia (farsighted); P presbyopia; Mrx spectacle prescription;  CTL contact lenses; OD right eye; OS left eye; OU both eyes  XT exotropia; ET esotropia; PEK punctate epithelial keratitis; PEE punctate  epithelial erosions; DES dry eye syndrome; MGD meibomian gland dysfunction; ATs artificial tears; PFAT's preservative free artificial tears; [place] nuclear sclerotic cataract; PSC posterior subcapsular cataract; ERM epi-retinal membrane; PVD posterior vitreous detachment; RD retinal detachment; DM diabetes mellitus; DR diabetic retinopathy; NPDR non-proliferative diabetic retinopathy; PDR proliferative diabetic retinopathy; CSME clinically significant macular edema; DME diabetic macular edema; dbh dot blot hemorrhages; CWS cotton wool spot; POAG primary open angle glaucoma; C/D cup-to-disc ratio; HVF humphrey visual field; GVF goldmann visual field; OCT optical coherence tomography; IOP intraocular pressure; BRVO Branch retinal vein occlusion; CRVO central retinal vein occlusion; CRAO central retinal artery occlusion; BRAO branch retinal artery occlusion; RT retinal tear; SB scleral buckle; PPV pars plana vitrectomy; VH Vitreous hemorrhage; PRP panretinal laser photocoagulation; IVK intravitreal kenalog; VMT vitreomacular traction; MH Macular hole;  NVD neovascularization of the disc; NVE neovascularization elsewhere; AREDS age related eye disease study; ARMD age related macular degeneration; POAG primary open angle glaucoma; EBMD epithelial/anterior basement membrane dystrophy; ACIOL anterior chamber intraocular lens; IOL intraocular lens; PCIOL posterior chamber intraocular lens; Phaco/IOL phacoemulsification with intraocular lens placement; [place] photorefractive keratectomy; LASIK laser assisted in situ keratomileusis; HTN hypertension; DM diabetes mellitus; COPD chronic obstructive pulmonary disease

## 2020-01-15 ENCOUNTER — Ambulatory Visit (INDEPENDENT_AMBULATORY_CARE_PROVIDER_SITE_OTHER): Payer: Medicare Other | Admitting: Ophthalmology

## 2020-01-15 ENCOUNTER — Other Ambulatory Visit: Payer: Self-pay

## 2020-01-15 ENCOUNTER — Encounter (INDEPENDENT_AMBULATORY_CARE_PROVIDER_SITE_OTHER): Payer: Self-pay | Admitting: Ophthalmology

## 2020-01-15 DIAGNOSIS — Z961 Presence of intraocular lens: Secondary | ICD-10-CM

## 2020-01-15 DIAGNOSIS — H04123 Dry eye syndrome of bilateral lacrimal glands: Secondary | ICD-10-CM

## 2020-01-15 DIAGNOSIS — H35033 Hypertensive retinopathy, bilateral: Secondary | ICD-10-CM

## 2020-01-15 DIAGNOSIS — H3581 Retinal edema: Secondary | ICD-10-CM | POA: Diagnosis not present

## 2020-01-15 DIAGNOSIS — H353132 Nonexudative age-related macular degeneration, bilateral, intermediate dry stage: Secondary | ICD-10-CM | POA: Diagnosis not present

## 2020-01-15 DIAGNOSIS — I10 Essential (primary) hypertension: Secondary | ICD-10-CM

## 2020-01-18 NOTE — Patient Instructions (Addendum)
Health Maintenance Due  Topic Date Due  . INFLUENZA VACCINE Received at [facility]. Will call nurse for the date 10/21/2019   Discussed if does not have someone with her at night permanently then we would need to discontinue temazepam  Schedule your bone density test at check out desk. You may also call directly to X-ray at [phone] to schedule an appointment that is convenient for you.  - located [address] across the [address] from [place] - in the basement - you do need an appointment for the bone density tests.

## 2020-01-18 NOTE — Progress Notes (Signed)
Phone [phone] In person visit   Subjective:   Glenda [name] is a 86 y.o. year old very pleasant female patient who presents for/with See problem oriented charting Chief Complaint  Patient presents with  . Medication Refill   This visit occurred during the SARS-CoV-2 public health emergency.  Safety protocols were in place, including screening questions prior to the visit, additional usage of staff PPE, and extensive cleaning of exam room while observing appropriate contact time as indicated for disinfecting solutions.   Past Medical History-  Patient Active Problem List   Diagnosis Date Noted  . Incarcerated ventral hernia 06/06/2019    Priority: High  . DNR (do not resuscitate) 06/06/2019    Priority: High  . Chronic pain syndrome 02/09/2018    Priority: High  . Chronic narcotic use 08/11/2017    Priority: High  . Malignant neoplasm of upper-outer quadrant of left breast in female, estrogen receptor positive ([place]) 06/27/2017    Priority: High  . Atrial flutter ([place])     Priority: High  . Chronic diastolic HF (heart failure) ([place]) 07/11/2012    Priority: High  . Osteoarthritis 04/03/2012    Priority: High  . Bradycardia 02/20/2019    Priority: Medium  . Anxiety 08/08/2018    Priority: Medium  . Shortness of breath 12/13/2014    Priority: Medium  . Essential hypertension 03/25/2011    Priority: Medium  . Insomnia 09/15/2010    Priority: Medium  . Status post total replacement of right hip 02/05/2019    Priority: Low  . Osteoarthritis of right hip 02/01/2019    Priority: Low  . History of cardioversion 08/20/2016    Priority: Low  . Chronic anticoagulation-Eliquis 02/04/2016    Priority: Low  . Palpitations 12/13/2014    Priority: Low  . Constipation 11/06/2012    Priority: Low  . Accelerated/malignant hypertension 09/14/2012    Priority: Low  . Anemia 06/10/2011    Priority: Low  . Paroxysmal atrial fibrillation ([place]) 01/21/2020  . Secondary  hypercoagulable state ([place]) 12/31/2019  . Osteopenia of neck of left femur 10/04/2019    Medications- reviewed and updated Current Outpatient Medications  Medication Sig Dispense Refill  . amLODipine (NORVASC) 5 MG tablet Take 1 tablet (5 mg total) by mouth daily. 90 tablet 3  . anastrozole (ARIMIDEX) 1 MG tablet Take 1 tablet (1 mg total) by mouth daily. 90 tablet 3  . apixaban (ELIQUIS) 2.5 MG TABS tablet Take 1 tablet (2.5 mg total) by mouth 2 (two) times daily. 60 tablet 4  . Cholecalciferol 25 MCG (1000 UT) tablet Take 1 tablet (1,000 Units total) by mouth daily. 90 tablet 3  . Cranberry 425 MG CAPS Take 425 mg by mouth 2 (two) times daily.    Marland Kitchen docusate sodium (COLACE) 100 MG capsule Take 100 mg by mouth 2 (two) times daily.    . furosemide (LASIX) 20 MG tablet Take 1.5 tablets (30 mg total) by mouth daily. 45 tablet 3  . [START ON 02/08/2020] HYDROcodone-acetaminophen (NORCO/VICODIN) 5-325 MG tablet TAKE (1) TABLET BY MOUTH TWICE DAILY AT 9AM AND 3PM. SEPARATE BY 6 HOURS FROM TEMAZEPAM. 30 tablet 0  . Multiple Vitamins-Minerals (CEROVITE ADVANCED FORMULA PO) Take 1 tablet by mouth daily. (0800)    . omeprazole (PRILOSEC) 40 MG capsule Take 40 mg by mouth daily.    . ondansetron (ZOFRAN) 4 MG tablet Take 4 mg by mouth every 6 (six) hours as needed for nausea.    . polyethylene glycol (MIRALAX / GLYCOLAX)  packet Take 17 g by mouth See admin instructions. Mix 17 grams in 8 ounces of juice/water and drink daily, Monday through Saturday    . sertraline (ZOLOFT) 25 MG tablet Take 1 tablet (25 mg total) by mouth daily. 90 tablet 3  . temazepam (RESTORIL) 15 MG capsule Take 1 capsule (15 mg total) by mouth at bedtime. 30 capsule 5  . [START ON 02/21/2020] amiodarone (PACERONE) 200 MG tablet Take 0.5 tablets (100 mg total) by mouth 2 (two) times daily for 30 days, THEN 0.5 tablets (100 mg total) daily. 40 tablet 3  . [START ON 03/09/2020] HYDROcodone-acetaminophen (NORCO/VICODIN) 5-325 MG tablet  TAKE (1) TABLET BY MOUTH TWICE DAILY AT 9AM AND 3PM. SEPARATE BY 6 HOURS FROM TEMAZEPAM. 60 tablet 0  . [START ON 04/08/2020] HYDROcodone-acetaminophen (NORCO/VICODIN) 5-325 MG tablet TAKE (1) TABLET BY MOUTH TWICE DAILY AT 9AM AND 3PM. SEPARATE BY 6 HOURS FROM TEMAZEPAM. 60 tablet 0   No current facility-administered medications for this visit.     Objective:  BP 128/64   Pulse 60   Temp 98 F (36.7 C) (Temporal)   Resp 18   Ht 5' (1.524 m)   Wt 111 lb 12.8 oz (50.7 kg)   SpO2 98%   BMI 21.83 kg/m  Gen: NAD, resting comfortably CV: RRR no murmurs rubs or gallops Lungs: CTAB no crackles, wheeze, rhonchi Ext: no edema Skin: warm, dry Neuro: In walker    Assessment and Plan   # Compression fracture- unfortunately patient with compression fracture T7 noted on x-ray 01/08/20 with Dr. Georgina Snell. Plans for CT scan as the next step- scheduled on 02/01/20. Has had worsening pain as a result of compression fracture. She fell at nighttime when trying to go to the bathroom. Now on fall risk precautions and someone will come help her. Discussed if does not have someone with her at night permanently then we would need to discontinue temazepam  # atrial fibrillation- recent episode of RVR. Options limited. Was started on amiodarone by Dr. Lovena Le 100mg  twice daily for 30 days planned. They did not think pacemaker was best idea with fraility- she reports still considering.   #Hypertension #Anxiety-low-dose Zoloft 25 mg has actually been helpful for blood pressure. S: Compliant with  amlodipine 5 mg (gets edema even on non max dose), Lasix 30 mg. Trial low dose zoloft for anxiety portion early 2020  Has been helpful  losartan 100 mg- held after surgery  March 2021 and has not been restarted Metoprolol caused bradycardis  Anxiety doing well also A/P: hypertension controlled- continue current meds. zoloft helpful for anxiety as well as BP- continue current meds    #Osteoarthritis/insomnia S:  Patient with continued pain particularly in her low back as well as in thoracic spine with fracture.  Also some finger pain.  Pain worsens with walking-hydrocodone lessens pain some.  She seems to do okay at rest as long as hydrocodone is available.  She uses hydrocodone twice a day and spaces last dose from 6 hours from temazepam to decrease fall risk.  Temazepam remains helpful  for sleep issues/insomnia- we tried to change to 7.5mg  dose but was very expensive so have opted to continue 15mg  dose (in past had PVCs provoked by poor sleep). Her facility has trazodone listed as allergy so we have opted out A/P: reasonable control- refill pain medication for 3 months as well as temazepam (needs fall precautions for at least 10 hours afte rmedicine- needs support)  -Last UDS april 2021 - NCCSRS reviewed  and only rx through me  -Controlled substance contract May 2019 -once again if she has future falls despite 24 hour support will need to discontinue temazepam  #Chronic diastolic heart failure S: Compliant with Lasix 30 mg.  Hesitant to increase amlodipine due to heart failure.  Edema-no recent increase.  Weight-weight largely stable - down 1 lb from last visit A/P: appears stable- continue current medicines.   #Atrial flutter S:Patient compliant with Eliquis 2.5 mg BID.  Cardioversion 2017 and appears to stay in normal sinus rhythm. Unfortunately recurrent issues recently- has had recurrence and now on amiodarone through cardiology A/P: recurrent issues. Appropriately anticoagulated. Hoping amiodarone would be helpful- working with cardiology  - did not do well with anesthesia hernia surgery march 2021 - makes me more hesitant about procedure  #osteopenia- we discussed updating this when safe from covid 19 previously- with recent fracture we need to update this now- ordered  Recommended follow up: Return in about 3 months (around 04/25/2020) for follow up- or sooner if needed. Future Appointments   Date Time Provider [place]  02/01/2020  2:30 PM [facility] 1 [facility]  02/07/2020 10:00 AM [facility] NURSE [facility]  03/03/2020  3:20 PM Lelon Perla, MD [facility]  03/04/2020 12:15 PM Evans Lance, MD [facility]  05/06/2020  1:30 PM Bernarda Caffey, MD [facility] None  10/21/2020 11:00 AM Nicholas Lose, MD [facility] None    Lab/Order associations:   ICD-10-CM   1. Osteopenia of neck of left femur  M85.852 DG Bone Density  2. Osteoarthritis of multiple joints, unspecified osteoarthritis type  M15.9   3. Chronic pain syndrome  G89.4   4. Typical atrial flutter (HCC)  I48.3   5. Insomnia, unspecified type  G47.00   6. Essential hypertension  I10   7. Anxiety  F41.9     Meds ordered this encounter  Medications  . HYDROcodone-acetaminophen (NORCO/VICODIN) 5-325 MG tablet    Sig: TAKE (1) TABLET BY MOUTH TWICE DAILY AT 9AM AND 3PM. SEPARATE BY 6 HOURS FROM TEMAZEPAM.    Dispense:  30 tablet    Refill:  0  . HYDROcodone-acetaminophen (NORCO/VICODIN) 5-325 MG tablet    Sig: TAKE (1) TABLET BY MOUTH TWICE DAILY AT 9AM AND 3PM. SEPARATE BY 6 HOURS FROM TEMAZEPAM.    Dispense:  60 tablet    Refill:  0  . HYDROcodone-acetaminophen (NORCO/VICODIN) 5-325 MG tablet    Sig: TAKE (1) TABLET BY MOUTH TWICE DAILY AT 9AM AND 3PM. SEPARATE BY 6 HOURS FROM TEMAZEPAM.    Dispense:  60 tablet    Refill:  0  . temazepam (RESTORIL) 15 MG capsule    Sig: Take 1 capsule (15 mg total) by mouth at bedtime.    Dispense:  30 capsule    Refill:  5     Return precautions advised.  Garret Reddish, MD

## 2020-01-21 ENCOUNTER — Ambulatory Visit (INDEPENDENT_AMBULATORY_CARE_PROVIDER_SITE_OTHER): Payer: Medicare Other | Admitting: Internal Medicine

## 2020-01-21 ENCOUNTER — Telehealth: Payer: Self-pay

## 2020-01-21 ENCOUNTER — Encounter: Payer: Self-pay | Admitting: Internal Medicine

## 2020-01-21 ENCOUNTER — Other Ambulatory Visit: Payer: Self-pay

## 2020-01-21 VITALS — BP 158/62 | HR 62 | Ht 60.0 in | Wt 108.6 lb

## 2020-01-21 DIAGNOSIS — R001 Bradycardia, unspecified: Secondary | ICD-10-CM | POA: Diagnosis not present

## 2020-01-21 DIAGNOSIS — I480 Paroxysmal atrial fibrillation: Secondary | ICD-10-CM | POA: Diagnosis not present

## 2020-01-21 NOTE — Progress Notes (Signed)
HPI Glenda [name] returns today for followup. She is a pleasant 86 yo woman with PAF, sinus node dysfunction, HTN and chronic anti-coagulation. She has been cardioverted a couple of weeks ago. She has not reverted back to NSR yet. She has a h/o RVR and sinus bradycardia on medical therapy. She has not had any bleeding on systemic anti-coagulation. We have been limited in her treatment options. Allergies  Allergen Reactions  . Ace Inhibitors Other (See Comments)    Cough  . Halcion [Triazolam]     UNSPECIFIED REACTION   . Pentazocine Lactate     UNSPECIFIED REACTION   . Sulfa Drugs Cross Reactors     UNSPECIFIED REACTION   . Trazodone And Nefazodone Other (See Comments)    Per MAR  . Amitriptyline Other (See Comments)    Hyperactivity  . Avelox [Moxifloxacin Hcl In Nacl] Other (See Comments)    dizziness  . Penicillins Rash     Current Outpatient Medications  Medication Sig Dispense Refill  . amLODipine (NORVASC) 5 MG tablet Take 1 tablet (5 mg total) by mouth daily. 90 tablet 3  . anastrozole (ARIMIDEX) 1 MG tablet Take 1 tablet (1 mg total) by mouth daily. 90 tablet 3  . apixaban (ELIQUIS) 2.5 MG TABS tablet Take 1 tablet (2.5 mg total) by mouth 2 (two) times daily. 60 tablet 4  . Cholecalciferol 25 MCG (1000 UT) tablet Take 1 tablet (1,000 Units total) by mouth daily. 90 tablet 3  . Cranberry 425 MG CAPS Take 425 mg by mouth 2 (two) times daily.    Marland Kitchen docusate sodium (COLACE) 100 MG capsule Take 100 mg by mouth 2 (two) times daily.    . furosemide (LASIX) 20 MG tablet Take 1.5 tablets (30 mg total) by mouth daily. 45 tablet 3  . HYDROcodone-acetaminophen (NORCO/VICODIN) 5-325 MG tablet TAKE (1) TABLET BY MOUTH TWICE DAILY AT 9AM AND 3PM. SEPARATE BY 6 HOURS FROM TEMAZEPAM. 60 tablet 0  . Multiple Vitamins-Minerals (CEROVITE ADVANCED FORMULA PO) Take 1 tablet by mouth daily. (0800)    . omeprazole (PRILOSEC) 40 MG capsule Take 40 mg by mouth daily.    . ondansetron (ZOFRAN)  4 MG tablet Take 4 mg by mouth every 6 (six) hours as needed for nausea.    . polyethylene glycol (MIRALAX / GLYCOLAX) packet Take 17 g by mouth See admin instructions. Mix 17 grams in 8 ounces of juice/water and drink daily, Monday through Saturday    . sertraline (ZOLOFT) 25 MG tablet Take 1 tablet (25 mg total) by mouth daily. 90 tablet 3  . temazepam (RESTORIL) 7.5 MG capsule Take 1 capsule (7.5 mg total) by mouth at bedtime as needed for sleep. 30 capsule 5   No current facility-administered medications for this visit.     Past Medical History:  Diagnosis Date  . Aortic stenosis    Echo 02/2011 showing mild AS with normal LV systolic function  . Arthritis    "lower back" (10/09/2015)  . Atrial flutter with rapid ventricular response ([place]) 08/19/2016   s/p successful DCCV on 08/20/16, continue eliquis  . Chronic bronchitis ([place])    "off and on; several years" (10/09/2015)  . Chronic diastolic CHF (congestive heart failure) ([place])    a. 12/2015 Echo: EF 55-60%, mild AI/MR, mildly dil LA.  Marland Kitchen Chronic lower back pain   . Complication of anesthesia    "they had trouble waking me up after colon resection"  . Confusion    Originally listed  as TIA-pt denies this hx on 10/09/2015 "it was the [place] I was taking; they had thought I was having a st originally roke"  . DDD (degenerative disc disease), lumbar    severe facet dz and adv DDD MRI L spine 2009  . Diverticulosis   . Dysrhythmia    A-Fib  . Femur fracture, right ([place]) 10/18/2018  . GERD (gastroesophageal reflux disease)   . Hiatal hernia    hx  . Hypercholesterolemia   . Hypertension   . Insomnia   . OA (osteoarthritis) of knee   . Paroxysmal atrial flutter ([place])    a. 09/2015 s/p TEE/DCCV;  b. CHA2DS2VASc = 5-->Eliquis.  . Peripheral edema   . PVCs (premature ventricular contractions)   . Sacral fracture ([place]) 11/06/2012  . Vertigo 11/19/2012    ROS:   All systems reviewed and negative except as noted in the HPI.   Past  Surgical History:  Procedure Laterality Date  . BREAST BIOPSY Left   . CARDIAC CATHETERIZATION  02/17/2001   MILD REGURGITATION. EF 60%  . CARDIOVERSION N/A 10/08/2015   Procedure: CARDIOVERSION;  Surgeon: Sanda Klein, MD;  Location: [facility];  Service: Cardiovascular;  Laterality: N/A;  . CARDIOVERSION N/A 08/20/2016   Procedure: Cardioversion;  Surgeon: Evans Lance, MD;  Location: [facility];  Service: Cardiovascular;  Laterality: N/A;  . CARDIOVERSION N/A 12/24/2019   Procedure: CARDIOVERSION;  Surgeon: Dorothy Spark, MD;  Location: [facility];  Service: Cardiovascular;  Laterality: N/A;  . CATARACT EXTRACTION W/ INTRAOCULAR LENS  IMPLANT, BILATERAL Bilateral   . [place]  . COLECTOMY     related to "blockage"  . COLONOSCOPY    . EXCISIONAL HEMORRHOIDECTOMY    . HIP PINNING,CANNULATED Right 10/19/2018   Procedure: Right percutaneous hip pinning;  Surgeon: Erle Crocker, MD;  Location: [facility];  Service: Orthopedics;  Laterality: Right;  . INCISIONAL HERNIA REPAIR N/A 06/07/2019   Procedure: Reduction and Repair of Incisional Hernia with Overlay Mesh;  Surgeon: Kinsinger, Arta Bruce, MD;  Location: [facility];  Service: General;  Laterality: N/A;  . INGUINAL HERNIA REPAIR Bilateral   . KYPHOPLASTY Right 09/11/2012   Procedure: Right Acrylic Sacroplasty;  Surgeon: Kristeen Miss, MD;  Location: [facility];  Service: Neurosurgery;  Laterality: Right;  Right  Acrylic Sacroplasty  . TEE WITHOUT CARDIOVERSION N/A 10/08/2015   Procedure: TRANSESOPHAGEAL ECHOCARDIOGRAM (TEE);  Surgeon: Sanda Klein, MD;  Location: [facility];  Service: Cardiovascular;  Laterality: N/A;  . TOTAL HIP ARTHROPLASTY Right 02/05/2019   Procedure: [place] TOTAL HIP ARTHROPLASTY ANTERIOR APPROACH AND REMOVAL OF SCREWS;  Surgeon: Frederik Pear, MD;  Location: [facility];  Service: Orthopedics;  Laterality: Right;  . TOTAL KNEE ARTHROPLASTY Right   . TOTAL MASTECTOMY Left 07/22/2017    Procedure: TOTAL MASTECTOMY;  Surgeon: Excell Seltzer, MD;  Location: [facility];  Service: General;  Laterality: Left;  . UMBILICAL HERNIA REPAIR       Family History  Problem Relation Age of Onset  . Heart disease Mother   . Hypertension Father   . Other Sister        34 in 2019  . Other Sister        25 in 2019  . Heart disease Sister   . Heart attack Neg Hx   . Stroke Neg Hx      Social History   Socioeconomic History  . Marital status: Widowed    Spouse name: Not on file  . Number of  children: Not on file  . Years of education: Not on file  . Highest education level: Not on file  Occupational History  . Not on file  Tobacco Use  . Smoking status: Never Smoker  . Smokeless tobacco: Never Used  Vaping Use  . Vaping Use: Never used  Substance and Sexual Activity  . Alcohol use: No  . Drug use: No  . Sexual activity: Never  Other Topics Concern  . 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 Difficulty of Paying Living Expenses: Not on file  Food Insecurity:   . Worried About Charity fundraiser in the Last Year: Not on file  . Ran Out of Food in the Last Year: Not on file  Transportation Needs:   . Lack of Transportation (Medical): Not on file  . Lack of Transportation (Non-Medical): Not on file  Physical Activity:   . Days of Exercise per Week: Not on file  . Minutes of Exercise per Session: Not on file  Stress:   . Feeling of Stress : Not on file  Social Connections:   . Frequency of Communication with Friends and Family: Not on file  . Frequency of Social Gatherings  with Friends and Family: Not on file  . Attends Religious Services: Not on file  . Active Member of Clubs or Organizations: Not on file  . Attends Archivist Meetings: Not on file  . Marital Status: Not on file  Intimate Partner Violence:   . Fear of Current or Ex-Partner: Not on file  . Emotionally Abused: Not on file  . Physically Abused: Not on file  . Sexually Abused: Not on file     BP (!) 158/62   Pulse 62   Ht 5' (1.524 m)   Wt 108 lb 9.6 oz (49.3 kg)   SpO2 98%   BMI 21.21 kg/m   Physical Exam:  Well appearing NAD HEENT: Unremarkable Neck:  No JVD, no thyromegally Lymphatics:  No adenopathy Back:  No CVA tenderness Lungs:  Clear HEART:  Regular rate rhythm, no murmurs, no rubs, no clicks Abd:  soft, positive bowel sounds, no organomegally, no rebound, no guarding Ext:  2 plus pulses, no edema, no cyanosis, no clubbing Skin:  No rashes no nodules Neuro:  CN II through XII intact, motor grossly intact   Assess/Plan: 1. Atrial fib/flutter with a RVR - she will certainly have more of this. I have discussed the treatment options with the patient and her son. There is no good option for her. She is quite frail and I am concerned about PPM insertion in conjunction with AA drug therapy. 2. Sinus node dysfunction - her HR's are in the 60 range on no meds. I would like to try low dose amiodarone. 3. Coags - she has not had any bleeding on low dose eliquis. 4. Disp. - I have reviewed the treatment options with the patient, her son, and Dr. London Sheer. I think a trial of low dose amiodarone might help. We  will start 200 mg daily and reduce down to 100 mg daily after a month. She will need an ECG in 2 weeks.   Carleene Overlie Shery [place],MD

## 2020-01-21 NOTE — Patient Instructions (Addendum)
Medication Instructions:  Your physician recommends that you continue on your current medications as directed. Please refer to the Current Medication list given to you today.  Labwork: None ordered.  Testing/Procedures: None ordered.  Follow-Up:  We will contact you later this week with a plan.  Any Other Special Instructions Will Be Listed Below (If Applicable).  If you need a refill on your cardiac medications before your next appointment, please call your pharmacy.

## 2020-01-21 NOTE — Telephone Encounter (Signed)
Start amiodarone 200 mg-1/2 tablet by mouth twice a day for 30 days. After 30 days reduce to 1/2 tablet by mouth daily.  Will need EKG in 2 weeks.  6 week f/u with Dr. Lovena Le.

## 2020-01-22 MED ORDER — AMIODARONE HCL 200 MG PO TABS: ORAL_TABLET | ORAL | 3 refills | Status: DC

## 2020-01-22 NOTE — Telephone Encounter (Signed)
Left message for Pt and son to call back to discuss plan of care.

## 2020-01-22 NOTE — Telephone Encounter (Signed)
Call back received from Pt.  She prefers this nurse give instruction to her son.  Will call son tomorrow morning.

## 2020-01-22 NOTE — Telephone Encounter (Signed)
Returned call to pt's son.  Gave new medication instructions.  Scheduled nurse visit.  Amiodarone prescription sent to [facility] as requested.  Await further needs.

## 2020-01-24 ENCOUNTER — Encounter: Payer: Self-pay | Admitting: Family Medicine

## 2020-01-24 ENCOUNTER — Other Ambulatory Visit: Payer: Self-pay

## 2020-01-24 ENCOUNTER — Ambulatory Visit (INDEPENDENT_AMBULATORY_CARE_PROVIDER_SITE_OTHER): Payer: Medicare Other | Admitting: Family Medicine

## 2020-01-24 VITALS — BP 128/64 | HR 60 | Temp 98.0°F | Resp 18 | Ht 60.0 in | Wt 111.8 lb

## 2020-01-24 DIAGNOSIS — I483 Typical atrial flutter: Secondary | ICD-10-CM

## 2020-01-24 DIAGNOSIS — M85852 Other specified disorders of bone density and structure, left thigh: Secondary | ICD-10-CM | POA: Diagnosis not present

## 2020-01-24 DIAGNOSIS — G4700 Insomnia, unspecified: Secondary | ICD-10-CM

## 2020-01-24 DIAGNOSIS — F419 Anxiety disorder, unspecified: Secondary | ICD-10-CM

## 2020-01-24 DIAGNOSIS — M159 Polyosteoarthritis, unspecified: Secondary | ICD-10-CM

## 2020-01-24 DIAGNOSIS — I10 Essential (primary) hypertension: Secondary | ICD-10-CM

## 2020-01-24 DIAGNOSIS — G894 Chronic pain syndrome: Secondary | ICD-10-CM | POA: Diagnosis not present

## 2020-01-24 MED ORDER — HYDROCODONE-ACETAMINOPHEN 5-325 MG PO TABS: ORAL_TABLET | ORAL | 0 refills | Status: DC

## 2020-01-24 MED ORDER — TEMAZEPAM 15 MG PO CAPS
15.0000 mg | ORAL_CAPSULE | Freq: Every day | ORAL | 5 refills | Status: DC
Start: 2020-01-24 — End: 2020-05-19

## 2020-01-24 MED ORDER — HYDROCODONE-ACETAMINOPHEN 5-325 MG PO TABS
ORAL_TABLET | ORAL | 0 refills | Status: DC
Start: 2020-02-08 — End: 2020-04-24

## 2020-02-01 ENCOUNTER — Ambulatory Visit
Admission: RE | Admit: 2020-02-01 | Discharge: 2020-02-01 | Disposition: A | Discharge status: Home / Self Care | Payer: Medicare Other | Source: Ambulatory Visit | Attending: Family Medicine | Admitting: Family Medicine

## 2020-02-01 ENCOUNTER — Other Ambulatory Visit: Payer: Medicare Other

## 2020-02-01 DIAGNOSIS — S22000A Wedge compression fracture of unspecified thoracic vertebra, initial encounter for closed fracture: Secondary | ICD-10-CM

## 2020-02-01 DIAGNOSIS — M545 Low back pain, unspecified: Secondary | ICD-10-CM

## 2020-02-04 ENCOUNTER — Other Ambulatory Visit: Payer: Self-pay | Admitting: Family Medicine

## 2020-02-04 ENCOUNTER — Telehealth: Payer: Self-pay | Admitting: *Deleted

## 2020-02-04 ENCOUNTER — Telehealth: Payer: Self-pay | Admitting: Family Medicine

## 2020-02-04 DIAGNOSIS — S22000A Wedge compression fracture of unspecified thoracic vertebra, initial encounter for closed fracture: Secondary | ICD-10-CM

## 2020-02-04 NOTE — Progress Notes (Signed)
CT scan shows a severe compression fracture at T7 and a more milder compression fracture at T8.  Plan for referral to interventional radiology for evaluation for kyphoplasty or vertebroplasty treatment.  They may require MRI first.  You should hear soon about scheduling.

## 2020-02-04 NOTE — Telephone Encounter (Signed)
Referred to IR for compression fracture eval and treat.  Our office should call the IR clinic at [phone] to inform the clinic.

## 2020-02-04 NOTE — Telephone Encounter (Signed)
I s/w [name] to scheduled consult for Vertebral Plasty/KP, pt states she has to check with her driver and then she will c/b./vm

## 2020-02-04 NOTE — Telephone Encounter (Signed)
Called GI they are calling patient to get scheduled hopefully for Thursday as they have an opening

## 2020-02-07 ENCOUNTER — Telehealth: Payer: Self-pay | Admitting: Internal Medicine

## 2020-02-07 ENCOUNTER — Ambulatory Visit
Admission: RE | Admit: 2020-02-07 | Discharge: 2020-02-07 | Disposition: A | Discharge status: Home / Self Care | Payer: Medicare Other | Source: Ambulatory Visit | Attending: Family Medicine | Admitting: Family Medicine

## 2020-02-07 ENCOUNTER — Ambulatory Visit (INDEPENDENT_AMBULATORY_CARE_PROVIDER_SITE_OTHER): Payer: Medicare Other | Admitting: *Deleted

## 2020-02-07 ENCOUNTER — Other Ambulatory Visit: Payer: Medicare Other

## 2020-02-07 ENCOUNTER — Encounter: Payer: Self-pay | Admitting: *Deleted

## 2020-02-07 ENCOUNTER — Telehealth: Payer: Self-pay | Admitting: Cardiology

## 2020-02-07 ENCOUNTER — Other Ambulatory Visit: Payer: Self-pay

## 2020-02-07 VITALS — HR 56 | Ht 61.0 in

## 2020-02-07 DIAGNOSIS — S22000A Wedge compression fracture of unspecified thoracic vertebra, initial encounter for closed fracture: Secondary | ICD-10-CM

## 2020-02-07 DIAGNOSIS — I480 Paroxysmal atrial fibrillation: Secondary | ICD-10-CM

## 2020-02-07 HISTORY — PX: IR RADIOLOGIST EVAL & MGMT: IMG5224

## 2020-02-07 MED ORDER — AMIODARONE HCL 200 MG PO TABS
200.0000 mg | ORAL_TABLET | Freq: Every day | ORAL | 3 refills | Status: DC
Start: 2020-02-07 — End: 2020-02-18

## 2020-02-07 NOTE — Consult Note (Signed)
Chief Complaint: Back Pain  Referring Physician(s): Corey,Evan S  PCP: Dr. Garret Reddish  History of Present Illness: Glenda [name] is a 86 y.o. female presenting today to [facility] for consultation, kindly referred by Dr. Georgina Snell,  for ongoing life-style limiting back pain related to compression fracture.   Ms Glenda [name] is here today with her son for our consultation.   She tells me that about 2 months ago she fell at night time going to the bathroom.  She lives at [facility] in [place].  She recognized the pain in her back immediately, but tells me that "she thought it would get better".  The pain has persisted, and she has had CT imaging that shows a T7 compression fracture, vertebra plana configuration, and an anterior/superior endplate fracture of T8.  Both are new from CT imaging of 1 year prior, compatible with acute injury.   She tells me she has persisting back pain in the mid back, though has a difficult time assigning a quality and intensity.  She says that she has had pain with her OT/PT that was assigned after her right hip replacement performed last November (fracture).  The pain is worst when walking and standing, and most of her mobility now is in a wheelchair, which is new for her.   She does take vicodin, typically 1 tablet in the morning and 1 tablet in the evening.  She denies any significant constipation. She does feel somewhat woozy with taking the medication.   She reports having received vaccination and her booster.   Past Medical History:  Diagnosis Date  . Aortic stenosis    Echo 02/2011 showing mild AS with normal LV systolic function  . Arthritis    "lower back" (10/09/2015)  . Atrial flutter with rapid ventricular response ([place]) 08/19/2016   s/p successful DCCV on 08/20/16, continue eliquis  . Chronic bronchitis ([place])    "off and on; several years" (10/09/2015)  . Chronic diastolic CHF (congestive heart failure) ([place])    a. 12/2015 Echo: EF  55-60%, mild AI/MR, mildly dil LA.  Marland Kitchen Chronic lower back pain   . Complication of anesthesia    "they had trouble waking me up after colon resection"  . Confusion    Originally listed as TIA-pt denies this hx on 10/09/2015 "it was the [place] I was taking; they had thought I was having a st originally roke"  . DDD (degenerative disc disease), lumbar    severe facet dz and adv DDD MRI L spine 2009  . Diverticulosis   . Dysrhythmia    A-Fib  . Femur fracture, right ([place]) 10/18/2018  . GERD (gastroesophageal reflux disease)   . Hiatal hernia    hx  . Hypercholesterolemia   . Hypertension   . Insomnia   . OA (osteoarthritis) of knee   . Paroxysmal atrial flutter ([place])    a. 09/2015 s/p TEE/DCCV;  b. CHA2DS2VASc = 5-->Eliquis.  . Peripheral edema   . PVCs (premature ventricular contractions)   . Sacral fracture ([place]) 11/06/2012  . Vertigo 11/19/2012    Past Surgical History:  Procedure Laterality Date  . BREAST BIOPSY Left   . CARDIAC CATHETERIZATION  02/17/2001   MILD REGURGITATION. EF 60%  . CARDIOVERSION N/A 10/08/2015   Procedure: CARDIOVERSION;  Surgeon: Sanda Klein, MD;  Location: [facility];  Service: Cardiovascular;  Laterality: N/A;  . CARDIOVERSION N/A 08/20/2016   Procedure: Cardioversion;  Surgeon: Evans Lance, MD;  Location: [facility];  Service:  Cardiovascular;  Laterality: N/A;  . CARDIOVERSION N/A 12/24/2019   Procedure: CARDIOVERSION;  Surgeon: Dorothy Spark, MD;  Location: [facility];  Service: Cardiovascular;  Laterality: N/A;  . CATARACT EXTRACTION W/ INTRAOCULAR LENS  IMPLANT, BILATERAL Bilateral   . [place]  . COLECTOMY     related to "blockage"  . COLONOSCOPY    . EXCISIONAL HEMORRHOIDECTOMY    . HIP PINNING,CANNULATED Right 10/19/2018   Procedure: Right percutaneous hip pinning;  Surgeon: Erle Crocker, MD;  Location: [facility];  Service: Orthopedics;  Laterality: Right;  . INCISIONAL HERNIA REPAIR N/A 06/07/2019    Procedure: Reduction and Repair of Incisional Hernia with Overlay Mesh;  Surgeon: Kinsinger, Arta Bruce, MD;  Location: [facility];  Service: General;  Laterality: N/A;  . INGUINAL HERNIA REPAIR Bilateral   . IR RADIOLOGIST EVAL & MGMT  02/07/2020  . KYPHOPLASTY Right 09/11/2012   Procedure: Right Acrylic Sacroplasty;  Surgeon: Kristeen Miss, MD;  Location: [facility];  Service: Neurosurgery;  Laterality: Right;  Right  Acrylic Sacroplasty  . TEE WITHOUT CARDIOVERSION N/A 10/08/2015   Procedure: TRANSESOPHAGEAL ECHOCARDIOGRAM (TEE);  Surgeon: Sanda Klein, MD;  Location: [facility];  Service: Cardiovascular;  Laterality: N/A;  . TOTAL HIP ARTHROPLASTY Right 02/05/2019   Procedure: [place] TOTAL HIP ARTHROPLASTY ANTERIOR APPROACH AND REMOVAL OF SCREWS;  Surgeon: Frederik Pear, MD;  Location: [facility];  Service: Orthopedics;  Laterality: Right;  . TOTAL KNEE ARTHROPLASTY Right   . TOTAL MASTECTOMY Left 07/22/2017   Procedure: TOTAL MASTECTOMY;  Surgeon: Excell Seltzer, MD;  Location: [facility];  Service: General;  Laterality: Left;  . UMBILICAL HERNIA REPAIR      Allergies: Ace inhibitors, Halcion [triazolam], Pentazocine lactate, Sulfa drugs cross reactors, Trazodone and nefazodone, Amitriptyline, Avelox [moxifloxacin hcl in nacl], and Penicillins  Medications: Prior to Admission medications   Medication Sig Start Date End Date Taking? Authorizing Provider  amiodarone (PACERONE) 200 MG tablet Take 1 tablet (200 mg total) by mouth daily. 02/07/20   Evans Lance, MD  amLODipine (NORVASC) 5 MG tablet Take 1 tablet (5 mg total) by mouth daily. 09/28/19   Lelon Perla, MD  anastrozole (ARIMIDEX) 1 MG tablet Take 1 tablet (1 mg total) by mouth daily. 10/22/19   Nicholas Lose, MD  apixaban (ELIQUIS) 2.5 MG TABS tablet Take 1 tablet (2.5 mg total) by mouth 2 (two) times daily. 07/24/17   Greer Pickerel, MD  Cholecalciferol 25 MCG (1000 UT) tablet Take 1 tablet (1,000 Units total) by mouth daily. 10/04/19    Marin Olp, MD  Cranberry 425 MG CAPS Take 425 mg by mouth 2 (two) times daily.    [provider]  docusate sodium (COLACE) 100 MG capsule Take 100 mg by mouth 2 (two) times daily.    [provider]  furosemide (LASIX) 20 MG tablet Take 1.5 tablets (30 mg total) by mouth daily. 08/26/16   Baldwin Jamaica, PA-C  HYDROcodone-acetaminophen (NORCO/VICODIN) 5-325 MG tablet TAKE (1) TABLET BY MOUTH TWICE DAILY AT 9AM AND 3PM. SEPARATE BY 6 HOURS FROM TEMAZEPAM. 02/08/20   Marin Olp, MD  HYDROcodone-acetaminophen (NORCO/VICODIN) 5-325 MG tablet TAKE (1) TABLET BY MOUTH TWICE DAILY AT 9AM AND 3PM. SEPARATE BY 6 HOURS FROM TEMAZEPAM. 03/09/20   Marin Olp, MD  HYDROcodone-acetaminophen (NORCO/VICODIN) 5-325 MG tablet TAKE (1) TABLET BY MOUTH TWICE DAILY AT 9AM AND 3PM. SEPARATE BY 6 HOURS FROM TEMAZEPAM. 04/08/20   Marin Olp, MD  Multiple Vitamins-Minerals ([place]  PO) Take 1 tablet by mouth daily. (0800)    [provider]  omeprazole (PRILOSEC) 40 MG capsule Take 40 mg by mouth daily. 08/30/17   [provider]  ondansetron (ZOFRAN) 4 MG tablet Take 4 mg by mouth every 6 (six) hours as needed for nausea.    [provider]  polyethylene glycol (MIRALAX / GLYCOLAX) packet Take 17 g by mouth See admin instructions. Mix 17 grams in 8 ounces of juice/water and drink daily, Monday through Saturday    [provider]  sertraline (ZOLOFT) 25 MG tablet Take 1 tablet (25 mg total) by mouth daily. 06/25/19   Marin Olp, MD  temazepam (RESTORIL) 15 MG capsule Take 1 capsule (15 mg total) by mouth at bedtime. 01/24/20   Marin Olp, MD     Family History  Problem Relation Age of Onset  . Heart disease Mother   . Hypertension Father   . Other Sister        53 in 2019  . Other Sister        21 in 2019  . Heart disease Sister   . Heart attack Neg Hx   . Stroke Neg Hx     Social History    Socioeconomic History  . Marital status: Widowed    Spouse name: Not on file  . Number of children: Not on file  . Years of education: Not on file  . Highest education level: Not on file  Occupational History  . Not on file  Tobacco Use  . Smoking status: Never Smoker  . Smokeless tobacco: Never Used  Vaping Use  . Vaping Use: Never used  Substance and Sexual Activity  . Alcohol use: No  . Drug use: No  . Sexual activity: Never  Other Topics Concern  . 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 Difficulty of Paying Living Expenses: Not on file  Food Insecurity:   . Worried About Charity fundraiser in the Last Year: Not on file  . Ran Out of Food in the Last Year: Not on file  Transportation Needs:   . Lack of Transportation (Medical): Not on file  . Lack of Transportation (Non-Medical): Not on file  Physical Activity:   . Days of Exercise per Week: Not on file  . Minutes of Exercise per Session: Not on file  Stress:   . Feeling of Stress : Not on file  Social Connections:   . Frequency of Communication with Friends and Family: Not on file  . Frequency of Social Gatherings with Friends and Family: Not on file  . Attends Religious Services: Not on file  . Active Member of Clubs or Organizations: Not on file  . Attends Archivist Meetings: Not on file  . Marital Status: Not on file      Review of Systems: A 12 point ROS discussed and pertinent positives are indicated in the HPI above.  All other systems are negative.  Review of Systems  Vital Signs: There were no  vitals taken for this visit.  Physical Exam General: 86 yo female appearing stated age.  Well-developed, well-nourished.  No distress. In wheelchair.  HEENT: Atraumatic, normocephalic.  Conjugate gaze, extra-ocular motor intact. No scleral icterus or scleral injection. No lesions on external ears, nose, lips, or gums.  Oral mucosa moist, pink.  Neck: Symmetric with no goiter enlargement.  Chest/Lungs:  Symmetric chest with inspiration/expiration.  No labored breathing.  Clear to auscultation with no wheezes, rhonchi, or rales.  Heart:  RRR, with no third heart sounds appreciated. No JVD appreciated.  Abdomen:  Soft, NT/ND, with + bowel sounds.   Genito-urinary: Deferred Neurologic: Alert & Oriented to person, place, and time.     MSK: reproducible pain upon palpation of the mid thoracic spine, corresponding to the level of injury.     Mallampati Score:     Imaging: DG Thoracic Spine 2 View  Result Date: 01/09/2020 CLINICAL DATA:  Fall with back pain EXAM: THORACIC SPINE 2 VIEWS COMPARISON:  10/18/2018 FINDINGS: T7 compression fracture with advanced height loss, new from 02/11/2019 chest CT. No gross retropulsion. No traumatic listhesis. Cardiomegaly.  Maintained posterior mediastinal fat planes. IMPRESSION: T7 compression fracture with advanced height loss. The fracture is new since a 02/11/2019 chest CT. Electronically Signed   By: Monte Fantasia M.D.   On: 01/09/2020 08:37   DG Lumbar Spine 2-3 Views  Result Date: 01/09/2020 CLINICAL DATA:  Low back pain after fall EXAM: LUMBAR SPINE - 2-3 VIEW COMPARISON:  10/18/2018 FINDINGS: No acute fracture or traumatic malalignment. Mild concavity of the L2 superior endplate is unchanged. Dextroscoliosis with advanced facet and disc degeneration at L3-4 and below. Mild degenerative appearing anterolisthesis at L3-4. Remote cement augmentation of the right sacral ala.  Anterolisthesis IMPRESSION: 1. No acute finding. 2. Lower lumbar degenerative  disease and scoliosis. Electronically Signed   By: Monte Fantasia M.D.   On: 01/09/2020 08:35   CT THORACIC SPINE WO CONTRAST  Result Date: 02/01/2020 CLINICAL DATA:  86 year old female with T7 compression fracture. EXAM: CT THORACIC SPINE WITHOUT CONTRAST TECHNIQUE: Multidetector CT images of the thoracic were obtained using the standard protocol without intravenous contrast. COMPARISON:  Thoracic spine radiographs 01/08/2020. CTA chest 02/11/2019. FINDINGS: Limited cervical spine imaging: Stable cervicothoracic junction since the 2020 chest CTA, slight anterolisthesis with mild degenerative facet hypertrophy. Thoracic spine segmentation:  Normal. Alignment: Focal thoracic kyphosis about the abnormal T7 vertebral body, about 38 degrees. Mild dextroconvex thoracic scoliosis has also developed associated with T7. No thoracic spondylolisthesis. Vertebrae: T7 vertebra plana with roughly 7 mm of retropulsed bone (series 7, image 36) resulting in 50% stenosis of the spinal canal at that level. Posterior elements remain normally aligned at T7. Superimposed anterior superior T8 vertebral body compression. Elsewhere thoracic vertebral height is stable from the 2020 CTA. L1 appears stable and intact. Visible posterior ribs appear intact. Paraspinal and other soft tissues: There is mild thoracic paraspinal hematoma associated with the T7 vertebral deformity (series 3, image 69). Other thoracic paraspinal soft tissues are negative. Calcified aortic atherosclerosis. Calcified coronary artery atherosclerosis. Stable cardiomegaly. No pericardial effusion. Stable visible lung parenchyma, noncontrast upper abdominal viscera. Disc levels: Mild for age superimposed thoracic disc degeneration. Aside from the T7 level the thoracic spinal canal is capacious, with no other evidence of thoracic spinal stenosis. IMPRESSION: 1. Severe compression fracture of T7 (vertebra plana) is estimated to be subacute, although Noncontrast MRI or  whole-body bone scan would best determine acuity. Bulky retropulsed bone results  in 50% stenosis of the spinal canal. 2. Superimposed mild anterior superior T8 vertebral body compression fracture. 3. No other acute osseous abnormality in the thoracic spine. Mild for age superimposed thoracic spine degeneration. 4. Aortic Atherosclerosis (ICD10-I70.0).  Cardiomegaly. Electronically Signed   By: Genevie Ann M.D.   On: 02/01/2020 20:26   OCT, Retina - OU - Both Eyes  Result Date: 01/15/2020 Right Eye Quality was good. Central Foveal Thickness: 294. Progression has been stable. Findings include normal foveal contour, no IRF, retinal drusen , no SRF, pigment epithelial detachment (Shallow PED). Left Eye Quality was good. Central Foveal Thickness: 283. Progression has been stable. Findings include normal foveal contour, no IRF, retinal drusen , outer retinal atrophy, pigment epithelial detachment, intraretinal hyper-reflective material, no SRF (Persistent central PEDs). Notes *Images captured and stored on drive Diagnosis / Impression: NFP; no IRF OU +drusen/PED OU Nonexudative ARMD OU Clinical management: See below Abbreviations: NFP - Normal foveal profile. CME - cystoid macular edema. PED - pigment epithelial detachment. IRF - intraretinal fluid. SRF - subretinal fluid. EZ - ellipsoid zone. ERM - epiretinal membrane. ORA - outer retinal atrophy. ORT - outer retinal tubulation. SRHM - subretinal hyper-reflective material. IRHM - intraretinal hyper-reflective material   IR Radiologist Eval & Mgmt  Result Date: 02/07/2020 Please refer to notes tab for details about interventional procedure. (Op Note)   Labs:  CBC: Recent Labs    06/10/19 0245 06/10/19 0245 06/11/19 0341 06/11/19 0341 06/12/19 0254 06/13/19 0317 06/25/19 1058 12/18/19 0918  WBC 7.0  --  6.3  --   --   --  5.5 5.2  HGB 9.5*   < > 9.1*   < > 9.3* 9.7* 11.9* 12.6  HCT 29.6*   < > 28.8*   < > 28.4* 30.7* 35.3* 38.8  PLT 149*  --  157   --   --   --  346.0 215   < > = values in this interval not displayed.    COAGS: Recent Labs    06/07/19 0625  INR 1.2    BMP: Recent Labs    06/08/19 0306 06/08/19 0306 06/09/19 0918 06/11/19 0341 06/25/19 1058 12/18/19 0918  NA 136   < > 137 137 136 138  K 3.7   < > 4.1 3.9 4.5 4.8  CL 101   < > 104 104 98 98  CO2 25   < > 24 24 32 26  GLUCOSE 145*   < > 99 106* 115* 79  BUN 24*   < > 23 27* 24* 23  CALCIUM 8.3*   < > 7.9* 7.6* 9.2 9.0  CREATININE 0.86   < > 0.79 0.74 0.69 0.76  GFRNONAA 59*  --  >60 >60  --  68  GFRAA >60  --  >60 >60  --  78   < > = values in this interval not displayed.    LIVER FUNCTION TESTS: Recent Labs    06/06/19 2102 06/08/19 0306 06/11/19 0341 06/25/19 1058  BILITOT 0.4 0.9  --  0.5  AST 22 19  --  17  ALT 12 10  --  7  ALKPHOS 115 71  --  103  PROT 8.2* 5.9*  --  6.9  ALBUMIN 4.4 2.8* 2.3* 3.8    TUMOR MARKERS: No results for input(s): AFPTM, CEA, CA199, CHROMGRNA in the last 8760 hours.  Assessment and Plan:  Ms Komar is very pleasant 86 year old female with symptomatic T7 and T8 compression fracture,  and life-style limiting pain.   I had a long discussion with her and her son regarding anatomy/pathology/pathophysiology of compression fracture, as well as possible management strategy.  These include conservative and more aggressive such as vertebral augmentation.  While conservative therapy is lower risk from non-invasive stand-point, the risk is not zero, as she is at significant risk of further deconditioning if she is unable to restore some of her prior level of activity.  Currently she is very restricted and is only using a wheelchair.  She used to be very active and with what sounds like high quality of life.    I had a specific discussion with them regarding the risk benefit of vertebral augmentation, with specific risks including: bleeding, infection, nerve injury, need for further surgery/procedure, hospitalization,  embolization, on-going pain, cardiopulmonary collapse, death.    After our discussion, she would like to discuss with her family and consider her options.    Plan: - Our recommendation is 2-level vertebral augmentation, kyphoplasty, of T7 and T8 compression fracture, with Dr. Earleen Newport - She is going to discuss with her family and get back to [name].  - She would have to be withdrawn from any blood thinner before therapy  Thank you for this interesting consult.  I greatly enjoyed meeting CHEY RACHELS and look forward to participating in their care.  A copy of this report was sent to the requesting provider on this date.  Electronically Signed: Corrie Mckusick 02/07/2020, 10:25 AM   I spent a total of  40 Minutes   in face to face in clinical consultation, greater than 50% of which was counseling/coordinating care for symptomatic compression fracture of T7 and T8, possible vertebral augmentation.

## 2020-02-07 NOTE — Patient Instructions (Signed)
Medication Instructions:  Continue amiodarone at 200 mg daily   *If you need a refill on your cardiac medications before your next appointment, please call your pharmacy*  Lab Work: None ordered.  If you have labs (blood work) drawn today and your tests are completely normal, you will receive your results only by: Marland Kitchen MyChart Message (if you have MyChart) OR . A paper copy in the mail If you have any lab test that is abnormal or we need to change your treatment, we will call you to review the results.  Testing/Procedures: None ordered.  Follow-Up: At [facility], you and your health needs are our priority.  As part of our continuing mission to provide you with exceptional heart care, we have created designated Provider Care Teams.  These Care Teams include your primary Cardiologist (physician) and Advanced Practice Providers (APPs -  Physician Assistants and Nurse Practitioners) who all work together to provide you with the care you need, when you need it.  We recommend signing up for the patient portal called "MyChart".  Sign up information is provided on this After Visit Summary.  MyChart is used to connect with patients for Virtual Visits (Telemedicine).  Patients are able to view lab/test results, encounter notes, upcoming appointments, etc.  Non-urgent messages can be sent to your provider as well.   To learn more about what you can do with MyChart, go to [email or web address].    Your next appointment:   Your physician wants you to follow-up in: 03/04/20 at 12:15 pm with Dr. Lovena Le.     Other Instructions:

## 2020-02-07 NOTE — Telephone Encounter (Signed)
Pt c/o medication issue:  1. Name of Medication: amiodarone (PACERONE) 200 MG tablet  2. How are you currently taking this medication (dosage and times per day)? 1/2 a tablet by mouth twice a day   3. Are you having a reaction (difficulty breathing--STAT)? No   4. What is your medication issue? Glenda [name] is calling wanting to confirm that this patient is to now start taking this medication 1 tablet by mouth daily instead of 1/2 a tablet two times daily before filling. Please advise.

## 2020-02-07 NOTE — Progress Notes (Signed)
1.) Reason for visit: 2 week post amiodarone start   2.) Name of MD requesting visit: Dr. Lovena Le  3.) H&P: Patient started Amiodarone 200 mg daily  4.) ROS related to problem: Tolerating mediation well. The afib no longer waking up at night. Still feels fluttering in chest at times but getting better.   5.) Assessment and plan per MD: EKG looks good, rate and QTc. Still in afib. Plan to continue current dosing until follow up appointment on 03/04/20 with Dr. Lovena Le.

## 2020-02-07 NOTE — Telephone Encounter (Signed)
Pharmacy please comment on the holding Eliquis in this patient prior to kyphoplasty and then we will contact her for clearance.  Kerin Ransom PA-C 02/07/2020 4:08 PM

## 2020-02-07 NOTE — Telephone Encounter (Signed)
   [facility] Pre-operative Risk Assessment    HEARTCARE STAFF: - Please ensure there is not already an duplicate clearance open for this procedure. - Under Visit Info/Reason for Call, type in Other and utilize the format Clearance MM/DD/YY or Clearance TBD. Do not use dashes or single digits. - If request is for dental extraction, please clarify the # of teeth to be extracted.  Request for surgical clearance:  1. What type of surgery is being performed? Vertebral augmentation kyphoplasty   2. When is this surgery scheduled? TBD requesting within 2 weeks   3. What type of clearance is required (medical clearance vs. Pharmacy clearance to hold med vs. Both)? Phamracy  4. Are there any medications that need to be held prior to surgery and how long? Hold eliquis 2 days prior  5. Practice name and name of physician performing surgery? [facility], Dr. Shanda Bumps  6. What is the office phone number? [phone]   7.   What is the office fax number? [phone]  8.   Anesthesia type (None, local, MAC, general) ? unknown   Glenda [name] 02/07/2020, 3:24 PM  _________________________________________________________________   (provider comments below)

## 2020-02-07 NOTE — Telephone Encounter (Signed)
Patient with diagnosis of afib on Eliquis for anticoagulation.    Procedure: vertebral augmentation kyphoplasty Date of procedure: TBD  CHA2DS2-VASc Score = 5  This indicates a 7.2% annual risk of stroke. The patient's score is based upon: CHF History: 1 (diastolic CHF) HTN History: 1 Diabetes History: 0 Stroke History: 0 Vascular Disease History: 0 Age Score: 2 Gender Score: 1   CrCl 59mL/min Platelet count 215K  Per office protocol, patient can hold Eliquis for 3 days prior to procedure.

## 2020-02-07 NOTE — Telephone Encounter (Signed)
Returned call to Pharmacist.  Advised Pt should continue amiodarone 200 mg 1/2 tablet by mouth twice a day until at least her upcoming appt March 04, 2020  Pharmacist thanked nurse for call

## 2020-02-08 NOTE — Telephone Encounter (Signed)
   Primary Cardiologist: Kirk Ruths, MD  Chart reviewed as part of pre-operative protocol coverage.   Per pharmacy recommendations, patient can hold eliquis 3 days prior to her upcoming kyphoplasty with plans to restart as soon as she is cleared to do so by her surgeon.   I will route this recommendation to the requesting party via Epic fax function and remove from pre-op pool.  Please call with questions.  Abigail Butts, PA-C 02/08/2020, 9:21 AM

## 2020-02-11 ENCOUNTER — Telehealth (HOSPITAL_COMMUNITY): Payer: Self-pay | Admitting: Radiology

## 2020-02-11 ENCOUNTER — Other Ambulatory Visit (HOSPITAL_COMMUNITY): Payer: Self-pay | Admitting: Interventional Radiology

## 2020-02-11 DIAGNOSIS — S22060A Wedge compression fracture of T7-T8 vertebra, initial encounter for closed fracture: Secondary | ICD-10-CM

## 2020-02-11 NOTE — Telephone Encounter (Signed)
Called pt, left VM for her to call to scheduled T7 & T8 KP with Wagner. JM

## 2020-02-12 ENCOUNTER — Telehealth (HOSPITAL_COMMUNITY): Payer: Self-pay | Admitting: Radiology

## 2020-02-12 NOTE — Telephone Encounter (Signed)
Pt returned my call about scheduling her T7 & T8 KP with [facility]. She is not available this week for the procedure but would like to look at next week. I will call her back when I find out if Earleen Newport can be at [facility] one day next week. JM

## 2020-02-13 NOTE — Telephone Encounter (Signed)
Glenda [name] with [facility] called to state that they need a signed order by the physician in regards to taking the amiodarone. They received the verbal and have started the patient taking the medication as directed but need the written and signed order within 15 days of starting it - they started on 02/07/20. Order can be faxed to [phone].  [name] can be reached at [phone] with any questions.

## 2020-02-18 MED ORDER — AMIODARONE HCL 200 MG PO TABS: ORAL_TABLET | ORAL | 3 refills | Status: DC

## 2020-02-18 NOTE — Addendum Note (Signed)
Addended by: Willeen Cass A on: 02/18/2020 07:21 AM   Modules accepted: Orders

## 2020-02-18 NOTE — Telephone Encounter (Signed)
Order faxed as requested

## 2020-02-20 NOTE — Progress Notes (Signed)
HPI: FU hypertension andatrial flutter. History of atypical chest pain and palpitations. She had a exercise treadmill test on 01/26/11 which showed no evidence of ischemia. 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 Admitted July 2017 with atrial flutter vs atrial tachycardia and underwent TEE guided cardioversion. Patient had recurrent atrial flutter May 2018 and had repeat cardioversion. Appointment was scheduled to see Dr. Lovena Le for consideration of ablation but he was not availableandseen by[place]enee [place]; she was inclined not to pursue procedures. Monitor March 2019 showed sinus with PACs and PVCs but no atrial flutter.Patient had recurrent atrial flutter following hip replacement November 2020. Echocardiogram February 2021 showed normal LV function, mild asymmetric left ventricular hypertrophy, mild left atrial enlargement, mild mitral regurgitation, mild aortic insufficiency. At last office visit September patient had recurrent atrial flutter. She underwent successful cardioversion and was placed on amiodarone.  Since last seen, she denies dyspnea, chest pain or syncope.  She does have palpitations at times.  Current Outpatient Medications  Medication Sig Dispense Refill   amiodarone (PACERONE) 200 MG tablet Take 1/2 tablet by mouth twice a day. 90 tablet 3   amLODipine (NORVASC) 5 MG tablet Take 1 tablet (5 mg total) by mouth daily. 90 tablet 3   anastrozole (ARIMIDEX) 1 MG tablet Take 1 tablet (1 mg total) by mouth daily. 90 tablet 3   apixaban (ELIQUIS) 2.5 MG TABS tablet Take 1 tablet (2.5 mg total) by mouth 2 (two) times daily. 60 tablet 4   Cholecalciferol 25 MCG (1000 UT) tablet Take 1 tablet (1,000 Units total) by mouth daily. 90 tablet 3   Cranberry 425 MG CAPS Take 425 mg by mouth 2 (two) times daily.     docusate sodium (COLACE) 100 MG capsule Take 100 mg by mouth 2 (two) times daily.     furosemide (LASIX) 20 MG tablet Take 1.5 tablets (30 mg total) by mouth daily. 45 tablet 3    HYDROcodone-acetaminophen (NORCO/VICODIN) 5-325 MG tablet TAKE (1) TABLET BY MOUTH TWICE DAILY AT 9AM AND 3PM. SEPARATE BY 6 HOURS FROM TEMAZEPAM. 30 tablet 0   [START ON 03/09/2020] HYDROcodone-acetaminophen (NORCO/VICODIN) 5-325 MG tablet TAKE (1) TABLET BY MOUTH TWICE DAILY AT 9AM AND 3PM. SEPARATE BY 6 HOURS FROM TEMAZEPAM. 60 tablet 0   [START ON 04/08/2020] HYDROcodone-acetaminophen (NORCO/VICODIN) 5-325 MG tablet TAKE (1) TABLET BY MOUTH TWICE DAILY AT 9AM AND 3PM. SEPARATE BY 6 HOURS FROM TEMAZEPAM. 60 tablet 0   Multiple Vitamins-Minerals (CEROVITE ADVANCED FORMULA PO) Take 1 tablet by mouth daily. (0800)     omeprazole (PRILOSEC) 40 MG capsule Take 40 mg by mouth daily.     ondansetron (ZOFRAN) 4 MG tablet Take 4 mg by mouth every 6 (six) hours as needed for nausea.     polyethylene glycol (MIRALAX / GLYCOLAX) packet Take 17 g by mouth See admin instructions. Mix 17 grams in 8 ounces of juice/water and drink daily, Monday through Saturday     sertraline (ZOLOFT) 25 MG tablet Take 1 tablet (25 mg total) by mouth daily. 90 tablet 3   temazepam (RESTORIL) 15 MG capsule Take 1 capsule (15 mg total) by mouth at bedtime. 30 capsule 5   No current facility-administered medications for this visit.     Past Medical History:  Diagnosis Date   Aortic stenosis    Echo 02/2011 showing mild AS with normal LV systolic function   Arthritis    "lower back" (10/09/2015)   Atrial flutter with rapid ventricular response ([place]) 08/19/2016  s/p successful DCCV on 08/20/16, continue eliquis   Chronic bronchitis ([place])    "off and on; several years" (10/09/2015)   Chronic diastolic CHF (congestive heart failure) ([place])    a. 12/2015 Echo: EF 55-60%, mild AI/MR, mildly dil LA.   Chronic lower back pain    Complication of anesthesia    "they had trouble waking me up after colon resection"   Confusion    Originally listed as TIA-pt denies this hx on 10/09/2015 "it was the [place] I was taking;  they had thought I was having a st originally roke"   DDD (degenerative disc disease), lumbar    severe facet dz and adv DDD MRI L spine 2009   Diverticulosis    Dysrhythmia    A-Fib   Femur fracture, right ([place]) 10/18/2018   GERD (gastroesophageal reflux disease)    Hiatal hernia    hx   Hypercholesterolemia    Hypertension    Insomnia    OA (osteoarthritis) of knee    Paroxysmal atrial flutter ([place])    a. 09/2015 s/p TEE/DCCV;  b. CHA2DS2VASc = 5-->Eliquis.   Peripheral edema    PVCs (premature ventricular contractions)    Sacral fracture ([place]) 11/06/2012   Vertigo 11/19/2012    Past Surgical History:  Procedure Laterality Date   BREAST BIOPSY Left    CARDIAC CATHETERIZATION  02/17/2001   MILD REGURGITATION. EF 60%   CARDIOVERSION N/A 10/08/2015   Procedure: CARDIOVERSION;  Surgeon: Sanda Klein, MD;  Location: [facility];  Service: Cardiovascular;  Laterality: N/A;   CARDIOVERSION N/A 08/20/2016   Procedure: Cardioversion;  Surgeon: Evans Lance, MD;  Location: [facility];  Service: Cardiovascular;  Laterality: N/A;   CARDIOVERSION N/A 12/24/2019   Procedure: CARDIOVERSION;  Surgeon: Dorothy Spark, MD;  Location: [facility] END[facility]COPY;  Service: Cardiovascular;  Laterality: N/A;   CATARACT EXTRACTION W/ INTRAOCULAR LENS  IMPLANT, BILATERAL Bilateral    [place]     related to "blockage"   COLONOSCOPY     EXCISIONAL HEMORRHOIDECTOMY     HIP PINNING,CANNULATED Right 10/19/2018   Procedure: Right percutaneous hip pinning;  Surgeon: Erle Crocker, MD;  Location: [facility];  Service: Orthopedics;  Laterality: Right;   INCISIONAL HERNIA REPAIR N/A 06/07/2019   Procedure: Reduction and Repair of Incisional Hernia with Overlay Mesh;  Surgeon: Kinsinger, Arta Bruce, MD;  Location: [facility];  Service: General;  Laterality: N/A;   INGUINAL HERNIA REPAIR Bilateral    IR RADIOLOGIST EVAL & MGMT  02/07/2020   KYPHOPLASTY Right  09/11/2012   Procedure: Right Acrylic Sacroplasty;  Surgeon: Kristeen Miss, MD;  Location: [facility];  Service: Neurosurgery;  Laterality: Right;  Right  Acrylic Sacroplasty   TEE WITHOUT CARDIOVERSION N/A 10/08/2015   Procedure: TRANSESOPHAGEAL ECHOCARDIOGRAM (TEE);  Surgeon: Sanda Klein, MD;  Location: [facility];  Service: Cardiovascular;  Laterality: N/A;   TOTAL HIP ARTHROPLASTY Right 02/05/2019   Procedure: [facility] TOTAL HIP ARTHROPLASTY ANTERIOR APPROACH AND REMOVAL OF SCREWS;  Surgeon: Frederik Pear, MD;  Location: [facility];  Service: Orthopedics;  Laterality: Right;   TOTAL KNEE ARTHROPLASTY Right    TOTAL MASTECTOMY Left 07/22/2017   Procedure: TOTAL MASTECTOMY;  Surgeon: Excell Seltzer, MD;  Location: [facility];  Service: General;  Laterality: Left;   UMBILICAL HERNIA REPAIR      Social History   Socioeconomic History   Marital status: Widowed    Spouse name: Not on file   Number of children: Not on  file   Years of education: Not on file   Highest education level: Not on file  Occupational History   Not on file  Tobacco Use   Smoking status: Never Smoker   Smokeless tobacco: Never Used  Vaping Use   Vaping Use: Never used  Substance and Sexual Activity   Alcohol use: No   Drug use: No   Sexual activity: Never  Other Topics Concern   Not on file  Social History Narrative   Widowed since 2010   Lives in assisted living at [facility]      Claims adjusting when in [place]- 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 [place]   She has two children ( one local is a Marine scientist with [employer], and one in [place])       [place]: walks after every meal, difficulty standing with back, activities at [facility]   Social Determinants of Health   Financial Resource Strain: Not on file  Food Insecurity: Not on file  Transportation Needs: Not on file  Physical Activity: Not on file  Stress: Not on file  Social Connections: Not on file  Intimate Partner Violence:  Not on file    Family History  Problem Relation Age of Onset   Heart disease Mother    Hypertension Father    Other Sister        52 in 2019   Other Sister        70 in 2019   Heart disease Sister    Heart attack Neg Hx    Stroke Neg Hx     ROS: no fevers or chills, productive cough, hemoptysis, dysphasia, odynophagia, melena, hematochezia, dysuria, hematuria, rash, seizure activity, orthopnea, PND, pedal edema, claudication. Remaining systems are negative.  Physical Exam: Well-developed well-nourished in no acute distress.  Skin is warm and dry.  HEENT is normal.  Neck is supple.  Chest is clear to auscultation with normal expansion.  Cardiovascular exam is regular rate and rhythm.  Abdominal exam nontender or distended. No masses palpated. Extremities show no edema. neuro grossly intact  ECG-sinus bradycardia at a rate of 49, first-degree AV block, left axis deviation, left ventricular hypertrophy.  Personally reviewed  A/P  1 atrial flutter-patient is now status post cardioversion and remains in sinus rhythm.  Continue apixaban.  Continue amiodarone but decrease to 100 mg daily as outlined by Dr. Lovena Le.  Follow for bradycardia as she does have a history of this.  Check TSH, liver functions and chest x-ray.  2 hypertension-blood pressure elevated; increase amlodipine to 10 mg daily and follow.  3 palpitations-beta-blocker discontinued previously due to bradycardia.  No recent symptoms.  4 chronic diastolic congestive heart failure-patient appears to be euvolemic.  Continue Lasix.  5 hyperlipidemia-continue statin.  Kirk Ruths, MD

## 2020-02-25 ENCOUNTER — Telehealth: Payer: Self-pay | Admitting: Cardiology

## 2020-02-25 NOTE — Telephone Encounter (Signed)
Spoke with pt son, the patient is seeing dr taylor 1 day after dr Stanford Breed for an EKG and regarding amiodarone. He would like to cancel the [facility] appointment as we can do the EKG at the [facility] appointment. Dr Lovena Le appointment canceled and we will forward any information to dr taylor after she sees dr Lovena Le.

## 2020-02-25 NOTE — Telephone Encounter (Signed)
Patient's son [name] states the patient has an appointment with Dr. Stanford Breed on 03/03/2020 and another appointment 03/04/2020 with Dr. Lovena Le. He would like to know if she can get everything down at one appointment.

## 2020-02-28 ENCOUNTER — Telehealth (HOSPITAL_COMMUNITY): Payer: Self-pay | Admitting: Radiology

## 2020-02-28 NOTE — Telephone Encounter (Signed)
Called pt, left VM for her to call to schedule T7 and T8 KP with [facility]. JM

## 2020-02-29 ENCOUNTER — Telehealth: Payer: Self-pay | Admitting: Cardiology

## 2020-02-29 NOTE — Telephone Encounter (Signed)
Follow Up:    Need a written clearance signed by the doctor to hold pt Eliquis for 03-04-20 thru 03-06-20. Please fax asap to [phone].

## 2020-02-29 NOTE — Telephone Encounter (Signed)
Eliquis recommendations were addressed on 02/08/2020 per phone note. Will refax that to requested number below.  Darreld Mclean, PA-C 02/29/2020 12:27 PM Pager: [phone]

## 2020-03-03 ENCOUNTER — Ambulatory Visit (INDEPENDENT_AMBULATORY_CARE_PROVIDER_SITE_OTHER): Payer: Medicare Other | Admitting: Cardiology

## 2020-03-03 ENCOUNTER — Other Ambulatory Visit: Payer: Self-pay | Admitting: Radiology

## 2020-03-03 ENCOUNTER — Encounter: Payer: Self-pay | Admitting: Cardiology

## 2020-03-03 ENCOUNTER — Other Ambulatory Visit: Payer: Self-pay

## 2020-03-03 ENCOUNTER — Telehealth: Payer: Self-pay | Admitting: Cardiology

## 2020-03-03 VITALS — BP 156/60 | HR 49 | Ht 60.0 in | Wt 111.0 lb

## 2020-03-03 DIAGNOSIS — I4892 Unspecified atrial flutter: Secondary | ICD-10-CM | POA: Diagnosis not present

## 2020-03-03 DIAGNOSIS — I5032 Chronic diastolic (congestive) heart failure: Secondary | ICD-10-CM

## 2020-03-03 DIAGNOSIS — I480 Paroxysmal atrial fibrillation: Secondary | ICD-10-CM | POA: Diagnosis not present

## 2020-03-03 DIAGNOSIS — I10 Essential (primary) hypertension: Secondary | ICD-10-CM

## 2020-03-03 MED ORDER — AMIODARONE HCL 100 MG PO TABS: 100.0000 mg | ORAL_TABLET | Freq: Every day | ORAL | 3 refills | Status: DC

## 2020-03-03 MED ORDER — AMLODIPINE BESYLATE 10 MG PO TABS: 10.0000 mg | ORAL_TABLET | Freq: Every day | ORAL | 3 refills | Status: DC

## 2020-03-03 NOTE — Telephone Encounter (Signed)
° °  [facility] Pre-operative Risk Assessment    HEARTCARE STAFF: - Please ensure there is not already an duplicate clearance open for this procedure. - Under Visit Info/Reason for Call, type in Other and utilize the format Clearance MM/DD/YY or Clearance TBD. Do not use dashes or single digits. - If request is for dental extraction, please clarify the # of teeth to be extracted.  Request for surgical clearance:  1. What type of surgery is being performed? Back surgery  2. When is this surgery scheduled? 03/06/20  3. What type of clearance is required (medical clearance vs. Pharmacy clearance to hold med vs. Both)? Pharmacy  4. Are there any medications that need to be held prior to surgery and how long? Eliquis 3 days prior  5. Practice name and name of physician performing surgery? Dr. Earleen [name]  6. What is the office phone number? [phone]   7.   What is the office fax number? [phone]  8.   Anesthesia type (None, local, MAC, general) ?    Glenda [name] 03/03/2020, 2:12 PM  _________________________________________________________________   (provider comments below)

## 2020-03-03 NOTE — Patient Instructions (Signed)
Medication Instructions:   DECREASE AMIODARONE TO 100 MG ONCE DAILY= 1/2 OF THE 200 MG TABLET ONCE DAILY  INCREASE AMLODIPINE TO 10 MG ONCE DAILY= 2 OF THE 5 MG TABLETS ONCE DAILY  *If you need a refill on your cardiac medications before your next appointment, please call your pharmacy*   Lab Work:  Your physician recommends that you return for lab work WHEN CONVENIENT  If you have labs (blood work) drawn today and your tests are completely normal, you will receive your results only by: Marland Kitchen MyChart Message (if you have MyChart) OR . A paper copy in the mail If you have any lab test that is abnormal or we need to change your treatment, we will call you to review the results.   Testing/Procedures:  A chest x-ray takes a picture of the organs and structures inside the chest, including the heart, lungs, and blood vessels. This test can show several things, including, whether the heart is enlarges; whether fluid is building up in the lungs; and whether pacemaker / defibrillator leads are still in place. [place]   Follow-Up: At [facility], you and your health needs are our priority.  As part of our continuing mission to provide you with exceptional heart care, we have created designated Provider Care Teams.  These Care Teams include your primary Cardiologist (physician) and Advanced Practice Providers (APPs -  Physician Assistants and Nurse Practitioners) who all work together to provide you with the care you need, when you need it.  We recommend signing up for the patient portal called "MyChart".  Sign up information is provided on this After Visit Summary.  MyChart is used to connect with patients for Virtual Visits (Telemedicine).  Patients are able to view lab/test results, encounter notes, upcoming appointments, etc.  Non-urgent messages can be sent to your provider as well.   To learn more about what you can do with MyChart, go to [email or web address].    Your next  appointment:   6 month(s)  The format for your next appointment:   In Person  Provider:   Kirk Ruths, MD

## 2020-03-04 ENCOUNTER — Ambulatory Visit: Payer: Medicare Other | Admitting: Internal Medicine

## 2020-03-04 ENCOUNTER — Encounter: Payer: Self-pay | Admitting: *Deleted

## 2020-03-04 LAB — HEPATIC FUNCTION PANEL
ALT: 10 IU/L (ref 0–32)
AST: 15 IU/L (ref 0–40)
Albumin: 4.3 g/dL (ref 3.5–4.6)
Alkaline Phosphatase: 120 IU/L (ref 44–121)
Bilirubin Total: 0.2 mg/dL (ref 0.0–1.2)
Bilirubin, Direct: 0.1 mg/dL (ref 0.00–0.40)
Total Protein: 7.4 g/dL (ref 6.0–8.5)

## 2020-03-04 LAB — TSH: TSH: 2.09 u[IU]/mL (ref 0.450–4.500)

## 2020-03-04 NOTE — Telephone Encounter (Signed)
Confirmed fax number [phone]

## 2020-03-04 NOTE — Telephone Encounter (Signed)
S/w Dr. Pasty Arch office who state they still have not received any of the faxes that we have sent in regards to clearance for the pt. I informed their office I will send over a manual fax for the clearance.

## 2020-03-05 ENCOUNTER — Other Ambulatory Visit: Payer: Self-pay | Admitting: Radiology

## 2020-03-06 ENCOUNTER — Encounter (HOSPITAL_COMMUNITY): Payer: Self-pay

## 2020-03-06 ENCOUNTER — Other Ambulatory Visit (HOSPITAL_COMMUNITY): Payer: Self-pay | Admitting: Interventional Radiology

## 2020-03-06 ENCOUNTER — Ambulatory Visit (HOSPITAL_COMMUNITY)
Admission: RE | Admit: 2020-03-06 | Discharge: 2020-03-06 | Disposition: A | Discharge status: Home / Self Care | Payer: Medicare Other | Source: Ambulatory Visit | Attending: Interventional Radiology | Admitting: Interventional Radiology

## 2020-03-06 ENCOUNTER — Other Ambulatory Visit: Payer: Self-pay

## 2020-03-06 DIAGNOSIS — Z880 Allergy status to penicillin: Secondary | ICD-10-CM | POA: Diagnosis not present

## 2020-03-06 DIAGNOSIS — S22060A Wedge compression fracture of T7-T8 vertebra, initial encounter for closed fracture: Secondary | ICD-10-CM | POA: Insufficient documentation

## 2020-03-06 DIAGNOSIS — Z7901 Long term (current) use of anticoagulants: Secondary | ICD-10-CM | POA: Diagnosis not present

## 2020-03-06 DIAGNOSIS — Z79899 Other long term (current) drug therapy: Secondary | ICD-10-CM | POA: Insufficient documentation

## 2020-03-06 DIAGNOSIS — W19XXXA Unspecified fall, initial encounter: Secondary | ICD-10-CM | POA: Insufficient documentation

## 2020-03-06 DIAGNOSIS — Z882 Allergy status to sulfonamides status: Secondary | ICD-10-CM | POA: Insufficient documentation

## 2020-03-06 DIAGNOSIS — Z888 Allergy status to other drugs, medicaments and biological substances status: Secondary | ICD-10-CM | POA: Diagnosis not present

## 2020-03-06 HISTORY — PX: IR KYPHO THORACIC WITH BONE BIOPSY: IMG5518

## 2020-03-06 HISTORY — PX: IR VERTEBROPLASTY CERV/THOR BX INC UNI/BIL INC/INJECT/IMAGING: IMG5515

## 2020-03-06 LAB — BASIC METABOLIC PANEL
Anion gap: 14 (ref 5–15)
BUN: 24 mg/dL — ABNORMAL HIGH (ref 8–23)
CO2: 25 mmol/L (ref 22–32)
Calcium: 9.2 mg/dL (ref 8.9–10.3)
Chloride: 94 mmol/L — ABNORMAL LOW (ref 98–111)
Creatinine, Ser: 0.9 mg/dL (ref 0.44–1.00)
GFR, Estimated: 60 mL/min — ABNORMAL LOW (ref >60)
Glucose, Bld: 89 mg/dL (ref 70–99)
Potassium: 5.9 mmol/L — ABNORMAL HIGH (ref 3.5–5.1)
Sodium: 133 mmol/L — ABNORMAL LOW (ref 135–145)

## 2020-03-06 LAB — CBC WITH DIFFERENTIAL/PLATELET
Abs Immature Granulocytes: 0.02 10*3/uL (ref 0.00–0.07)
Basophils Absolute: 0 10*3/uL (ref 0.0–0.1)
Basophils Relative: 0 %
Eosinophils Absolute: 0.1 10*3/uL (ref 0.0–0.5)
Eosinophils Relative: 1 %
HCT: 42.9 % (ref 36.0–46.0)
Hemoglobin: 14.3 g/dL (ref 12.0–15.0)
Immature Granulocytes: 0 %
Lymphocytes Relative: 22 %
Lymphs Abs: 1.3 10*3/uL (ref 0.7–4.0)
MCH: 29.2 pg (ref 26.0–34.0)
MCHC: 33.3 g/dL (ref 30.0–36.0)
MCV: 87.7 fL (ref 80.0–100.0)
Monocytes Absolute: 0.4 10*3/uL (ref 0.1–1.0)
Monocytes Relative: 7 %
Neutro Abs: 4.1 10*3/uL (ref 1.7–7.7)
Neutrophils Relative %: 70 %
Platelets: 233 10*3/uL (ref 150–400)
RBC: 4.89 MIL/uL (ref 3.87–5.11)
RDW: 14.5 % (ref 11.5–15.5)
WBC: 5.8 10*3/uL (ref 4.0–10.5)
nRBC: 0 % (ref 0.0–0.2)

## 2020-03-06 MED ORDER — MIDAZOLAM HCL 2 MG/2ML IJ SOLN
INTRAMUSCULAR | Status: AC | PRN
Start: 2020-03-06 — End: 2020-03-06
  Administered 2020-03-06 (×2): 0.5 mg via INTRAVENOUS

## 2020-03-06 MED ORDER — LIDOCAINE HCL 1 % IJ SOLN
INTRAMUSCULAR | Status: DC | PRN
  Administered 2020-03-06: 20 mL

## 2020-03-06 MED ORDER — IOHEXOL 300 MG/ML  SOLN: 50.0000 mL | Freq: Once | INTRAMUSCULAR | Status: DC | PRN

## 2020-03-06 MED ORDER — MIDAZOLAM HCL 2 MG/2ML IJ SOLN
INTRAMUSCULAR | Status: AC
  Filled 2020-03-06: qty 2

## 2020-03-06 MED ORDER — LIDOCAINE HCL 1 % IJ SOLN
INTRAMUSCULAR | Status: AC
  Filled 2020-03-06: qty 20

## 2020-03-06 MED ORDER — VANCOMYCIN HCL IN DEXTROSE 1-5 GM/200ML-% IV SOLN: 1000.0000 mg | INTRAVENOUS | Status: AC

## 2020-03-06 MED ORDER — FENTANYL CITRATE (PF) 100 MCG/2ML IJ SOLN
INTRAMUSCULAR | Status: AC
  Filled 2020-03-06: qty 2

## 2020-03-06 MED ORDER — FENTANYL CITRATE (PF) 100 MCG/2ML IJ SOLN
INTRAMUSCULAR | Status: AC | PRN
  Administered 2020-03-06: 25 ug via INTRAVENOUS

## 2020-03-06 MED ORDER — SODIUM CHLORIDE 0.9 % IV SOLN: INTRAVENOUS | Status: DC

## 2020-03-06 MED ORDER — VANCOMYCIN HCL IN DEXTROSE 1-5 GM/200ML-% IV SOLN
INTRAVENOUS | Status: AC
  Administered 2020-03-06: 14:00:00 1000 mg via INTRAVENOUS
  Filled 2020-03-06: qty 200

## 2020-03-06 NOTE — Discharge Instructions (Addendum)
Percutaneous Vertebroplasty, Care After This sheet gives you information about how to care for yourself after your procedure. Your doctor may also give you more specific instructions. If you have problems or questions, contact your doctor. Follow these instructions at home: Surgical cut (incision) care  Follow instructions from your doctor about how to take care of your cut from surgery. Make sure you: ? Wash your hands with soap and water before you change your bandage (dressing). If you cannot use soap and water, use hand sanitizer. ? Change your bandage as told by your doctor. ? Leave stitches (sutures), skin glue, or skin tape (adhesive) strips in place. They may need to stay in place for 2 weeks or longer. If tape strips get loose and curl up, you may trim the loose edges. Do not remove tape strips completely unless your doctor says it is okay.  Check your surgical cut area every day for signs of infection. Check for: ? Redness, swelling, or pain. ? Fluid or blood. ? Warmth. ? Pus or a bad smell.  Keep the bandage dry as told by your doctor. Do not shower or bathe until your doctor says it is okay. Managing pain, stiffness, and swelling   If directed, apply ice to the affected area: ? Put ice in a plastic bag. ? Place a towel between your skin and bag. ? Leave the ice on for 20 minutes, 2-3 times a day.  Rest for 24 hrs after the procedure or as told by your doctor. Activity  Slowly return to normal activities as told by your doctor.  Ask what type of stretching and strengthening exercises you should do.  Do not bend or lift anything greater than 10 lb (4.5 kg). Follow your doctor's instructions about bending and lifting. General instructions  Take over-the-counter and prescription medicines only as told by your doctor.  Do not drive for 24 hours if you were given a medicine to help you relax (sedative).  To prevent or treat constipation while you are taking prescription  pain medicine, your doctor may recommend that you: ? Drink enough fluid to keep your pee (urine) clear or pale yellow. ? Take over-the-counter or prescription medicines. ? Eat foods that are high in fiber, such as:  Fresh fruits.  Fresh vegetables.  Whole grains.  Beans. ? Limit foods that are high in fat and processed sugars, such as fried and sweet foods. ? Keep all follow-up visits as told by your doctor. This is important. Contact a doctor if:  You have redness, swelling, or pain around your cut.  You have fluid or blood coming from your cut.  Your cut feels warm to the touch.  You have pus or bad smell coming from your cut.  You have a fever.  You are sick to your stomach (nauseous) or throw up (vomit) for more than 24 hours.  Your back pain does not get better. Get help right away if:  You have very bad back pain that comes on all of a sudden.  You cannot control when you pee or poop (bowel movement).  You lose feeling (become numb) or have tingling in your legs or feet, or they become weak.  You have new tingling, numbness, or weakness in your legs or feet.  You have sudden weakness in your legs.  You have pain that shoots down your legs.  You have chest pain.  You have trouble breathing.  You are short of breath.  You feel dizzy or you pass   out (faint).  Your vision changes or you cannot talk as you normally do. Summary  Rest for 24 hrs after the procedure and return slowly to normal activities as told by your doctor.  Do not drive for 24 hours if you were given a medicine to help you relax (sedative).  Take over-the-counter and prescription medicines only as told by your doctor. This information is not intended to replace advice given to you by your health care provider. Make sure you discuss any questions you have with your health care provider. Document Revised: 02/18/2017 Document Reviewed: 06/08/2016 [employer] Patient Education  2020 [employer] Balloon Kyphoplasty, Care After This sheet gives you information about how to care for yourself after your procedure. Your health care provider may also give you more specific instructions. If you have problems or questions, contact your health care provider. What can I expect after the procedure? After your procedure, it is common to have back pain. Follow these instructions at home: Medicines  Take over-the-counter and prescription medicines only as told by your health care provider.  Ask your health care provider if the medicine prescribed to you: ? Requires you to avoid driving or using heavy machinery. ? Can cause constipation. You may need to take steps to prevent or treat constipation, such as:  Drink enough fluid to keep your urine pale yellow.  Take over-the-counter or prescription medicines.  Eat foods that are high in fiber, such as beans, whole grains, and fresh fruits and vegetables.  Limit foods that are high in fat and processed sugars, such as fried or sweet foods. Puncture site care   Follow instructions from your health care provider about how to take care of your puncture site. Make sure you: ? Wash your hands with soap and water before and after you change your bandage (dressing). If soap and water are not available, use hand sanitizer. ? Change your dressing as told by your health care provider. ? Leave skin glue or adhesive strips in place. These skin closures may need to be in place for 2 weeks or longer. If adhesive strip edges start to loosen and curl up, you may trim the loose edges. Do not remove adhesive strips completely unless your health care provider tells you to do that.  Check your puncture site every day for signs of infection. Watch for: ? Redness, swelling, or pain. ? Fluid or blood. ? Warmth. ? Pus or a bad smell.  Keep your dressing dry until your health care provider says that it can be removed. Managing pain, stiffness, and  swelling   If directed, put ice on the painful area. ? Put ice in a plastic bag. ? Place a towel between your skin and the bag. ? Leave the ice on for 20 minutes, 2-3 times a day. Activity  Rest your back and avoid intense physical activity for as long as told by your health care provider.  Avoid bending, lifting, or twisting your back for as long as told by your health care provider.  Return to your normal activities as told by your health care provider. Ask your health care provider what activities are safe for you.  Do not lift anything that is heavier than 5 lb (2.2 kg). You may need to avoid heavy lifting for several weeks. General instructions  Do not use any products that contain nicotine or tobacco, such as cigarettes, e-cigarettes, and chewing tobacco. These can delay bone healing. If you need help quitting, ask your health care   provider.  Do not drive for 24 hours if you were given a sedative during your procedure.  Keep all follow-up visits as told by your health care provider. This is important. Contact a health care provider if:  You have a fever or chills.  You have redness, swelling, or pain at the site of your puncture.  You have fluid, blood, or pus coming from the puncture site.  You have pain that gets worse or does not get better with medicine.  You develop numbness or weakness in any part of your body. Get help right away if:  You have chest pain.  You have difficulty breathing.  You have weakness, numbness, or tingling in your legs.  You cannot control your bladder or bowel movements.  You suddenly become weak or numb on one side of your body.  You become very confused.  You have trouble speaking or understanding, or both. Summary  Follow instructions from your health care provider about how to take care of your puncture site.  Take over-the-counter and prescription medicines only as told by your health care provider.  Rest your back and  avoid intense physical activity for as long as told by your health care provider.  Contact a health care provider if you have pain that gets worse or does not get better with medicine.  Keep all follow-up visits as told by your health care provider. This is important. This information is not intended to replace advice given to you by your health care provider. Make sure you discuss any questions you have with your health care provider. Document Revised: 02/13/2018 Document Reviewed: 02/13/2018 Else[employer] Patient Education  2020 [employer] Moderate Conscious Sedation, Adult Sedation is the use of medicines to promote relaxation and relieve discomfort and anxiety. Moderate conscious sedation is a type of sedation. Under moderate conscious sedation, you are less alert than normal, but you are still able to respond to instructions, touch, or both. Moderate conscious sedation is used during short medical and dental procedures. It is milder than deep sedation, which is a type of sedation under which you cannot be easily woken up. It is also milder than general anesthesia, which is the use of medicines to make you unconscious. Moderate conscious sedation allows you to return to your regular activities sooner. Tell a health care provider about:  Any allergies you have.  All medicines you are taking, including vitamins, herbs, eye drops, creams, and over-the-counter medicines.  Use of steroids (by mouth or creams).  Any problems you or family members have had with sedatives and anesthetic medicines.  Any blood disorders you have.  Any surgeries you have had.  Any medical conditions you have, such as sleep apnea.  Whether you are pregnant or may be pregnant.  Any use of cigarettes, alcohol, marijuana, or street drugs. What are the risks? Generally, this is a safe procedure. However, problems may occur, including:  Getting too much medicine (oversedation).  Nausea.  Allergic reaction to  medicines.  Trouble breathing. If this happens, a breathing tube may be used to help with breathing. It will be removed when you are awake and breathing on your own.  Heart trouble.  Lung trouble. What happens before the procedure? Staying hydrated Follow instructions from your health care provider about hydration, which may include:  Up to 2 hours before the procedure - you may continue to drink clear liquids, such as water, clear fruit juice, black coffee, and plain tea. Eating and drinking restrictions Follow instructions from   your health care provider about eating and drinking, which may include:  8 hours before the procedure - stop eating heavy meals or foods such as meat, fried foods, or fatty foods.  6 hours before the procedure - stop eating light meals or foods, such as toast or cereal.  6 hours before the procedure - stop drinking milk or drinks that contain milk.  2 hours before the procedure - stop drinking clear liquids. Medicine Ask your health care provider about:  Changing or stopping your regular medicines. This is especially important if you are taking diabetes medicines or blood thinners.  Taking medicines such as aspirin and ibuprofen. These medicines can thin your blood. Do not take these medicines before your procedure if your health care provider instructs you not to.  Tests and exams  You will have a physical exam.  You may have blood tests done to show: ? How well your kidneys and liver are working. ? How well your blood can clot. General instructions  Plan to have someone take you home from the hospital or clinic.  If you will be going home right after the procedure, plan to have someone with you for 24 hours. What happens during the procedure?  An IV tube will be inserted into one of your veins.  Medicine to help you relax (sedative) will be given through the IV tube.  The medical or dental procedure will be performed. What happens after the  procedure?  Your blood pressure, heart rate, breathing rate, and blood oxygen level will be monitored often until the medicines you were given have worn off.  Do not drive for 24 hours. This information is not intended to replace advice given to you by your health care provider. Make sure you discuss any questions you have with your health care provider. Document Revised: 02/18/2017 Document Reviewed: 06/28/2015 Elsevier Patient Education  2020 [employer]  

## 2020-03-06 NOTE — Procedures (Addendum)
Interventional Radiology Procedure Note  Procedure:  Vertebral augmentation T7 and T8.  Bipedicular approach for T7 VP, and right unipedicular approach for T8 for KP.  Marland Kitchen  Complications: None  Recommendations:  - 2 hour recovery - advance diet - Do not submerge x 7 days - Routine care  - may restart eliquis on schedule tomorrow  Signed,  Dulcy Fanny. Earleen Newport, DO

## 2020-03-06 NOTE — H&P (Signed)
Chief Complaint: Back Pain  Referring Physician(s): Gregor Hams  Supervising Physician: Corrie Mckusick  Patient Status: [facility] - Out-pt  History of Present Illness: Glenda [name] is a 85 y.o. female with life-style limiting back pain related to compression fracture.   She was seen by Dr. Earleen Newport  On 02/07/20 for evaluation for kyphoplasty.   About 2 months ago she fell at night time going to the bathroom.    She recognized the pain in her back immediately, but tells me that "she thought it would get better" but herer pain has persisted.   CT done 02/01/20 showed a T7 compression fracture, vertebra plana configuration, and an anterior/superior endplate fracture of T8.  Both are new from CT imaging of 1 year prior, compatible with acute injury.    She is here today for cement augmentation of T7 and T8.  She is NPO.    Past Medical History:  Diagnosis Date  . Aortic stenosis    Echo 02/2011 showing mild AS with normal LV systolic function  . Arthritis    "lower back" (10/09/2015)  . Atrial flutter with rapid ventricular response ([place]) 08/19/2016   s/p successful DCCV on 08/20/16, continue eliquis  . Chronic bronchitis ([place])    "off and on; several years" (10/09/2015)  . Chronic diastolic CHF (congestive heart failure) ([place])    a. 12/2015 Echo: EF 55-60%, mild AI/MR, mildly dil LA.  Marland Kitchen Chronic lower back pain   . Complication of anesthesia    "they had trouble waking me up after colon resection"  . Confusion    Originally listed as TIA-pt denies this hx on 10/09/2015 "it was the [place] I was taking; they had thought I was having a st originally roke"  . DDD (degenerative disc disease), lumbar    severe facet dz and adv DDD MRI L spine 2009  . Diverticulosis   . Dysrhythmia    A-Fib  . Femur fracture, right ([place]) 10/18/2018  . GERD (gastroesophageal reflux disease)   . Hiatal hernia    hx  . Hypercholesterolemia   . Hypertension   . Insomnia   . OA  (osteoarthritis) of knee   . Paroxysmal atrial flutter ([place])    a. 09/2015 s/p TEE/DCCV;  b. CHA2DS2VASc = 5-->Eliquis.  . Peripheral edema   . PVCs (premature ventricular contractions)   . Sacral fracture ([place]) 11/06/2012  . Vertigo 11/19/2012    Past Surgical History:  Procedure Laterality Date  . BREAST BIOPSY Left   . CARDIAC CATHETERIZATION  02/17/2001   MILD REGURGITATION. EF 60%  . CARDIOVERSION N/A 10/08/2015   Procedure: CARDIOVERSION;  Surgeon: Sanda Klein, MD;  Location: [place] ENDOSCOPY;  Service: Cardiovascular;  Laterality: N/A;  . CARDIOVERSION N/A 08/20/2016   Procedure: Cardioversion;  Surgeon: Evans Lance, MD;  Location: [place] CV LAB;  Service: Cardiovascular;  Laterality: N/A;  . CARDIOVERSION N/A 12/24/2019   Procedure: CARDIOVERSION;  Surgeon: Dorothy Spark, MD;  Location: [place];  Service: Cardiovascular;  Laterality: N/A;  . CATARACT EXTRACTION W/ INTRAOCULAR LENS  IMPLANT, BILATERAL Bilateral   . [place]  . COLECTOMY     related to "blockage"  . COLONOSCOPY    . EXCISIONAL HEMORRHOIDECTOMY    . HIP PINNING,CANNULATED Right 10/19/2018   Procedure: Right percutaneous hip pinning;  Surgeon: Erle Crocker, MD;  Location: [place];  Service: Orthopedics;  Laterality: Right;  . INCISIONAL HERNIA REPAIR N/A 06/07/2019   Procedure: Reduction and Repair of  Incisional Hernia with Overlay Mesh;  Surgeon: Kinsinger, Arta Bruce, MD;  Location: [place];  Service: General;  Laterality: N/A;  . INGUINAL HERNIA REPAIR Bilateral   . IR RADIOLOGIST EVAL & MGMT  02/07/2020  . KYPHOPLASTY Right 09/11/2012   Procedure: Right Acrylic Sacroplasty;  Surgeon: Kristeen Miss, MD;  Location: [place] NEURO ORS;  Service: Neurosurgery;  Laterality: Right;  Right  Acrylic Sacroplasty  . TEE WITHOUT CARDIOVERSION N/A 10/08/2015   Procedure: TRANSESOPHAGEAL ECHOCARDIOGRAM (TEE);  Surgeon: Sanda Klein, MD;  Location: [place];  Service: Cardiovascular;  Laterality:  N/A;  . TOTAL HIP ARTHROPLASTY Right 02/05/2019   Procedure: [place] TOTAL HIP ARTHROPLASTY ANTERIOR APPROACH AND REMOVAL OF SCREWS;  Surgeon: Frederik Pear, MD;  Location: [facility];  Service: Orthopedics;  Laterality: Right;  . TOTAL KNEE ARTHROPLASTY Right   . TOTAL MASTECTOMY Left 07/22/2017   Procedure: TOTAL MASTECTOMY;  Surgeon: Excell Seltzer, MD;  Location: [place];  Service: General;  Laterality: Left;  . UMBILICAL HERNIA REPAIR      Allergies: Ace inhibitors, Halcion [triazolam], Pentazocine lactate, Sulfa drugs cross reactors, Trazodone and nefazodone, Amitriptyline, Avelox [moxifloxacin hcl in nacl], and Penicillins  Medications: Prior to Admission medications   Medication Sig Start Date End Date Taking? Authorizing Provider  amiodarone (PACERONE) 200 MG tablet Take 100 mg by mouth daily.   Yes [provider]  amLODipine (NORVASC) 10 MG tablet Take 1 tablet (10 mg total) by mouth daily. 03/03/20  Yes Lelon Perla, MD  anastrozole (ARIMIDEX) 1 MG tablet Take 1 tablet (1 mg total) by mouth daily. 10/22/19  Yes Nicholas Lose, MD  apixaban (ELIQUIS) 2.5 MG TABS tablet Take 1 tablet (2.5 mg total) by mouth 2 (two) times daily. 07/24/17  Yes Greer Pickerel, MD  Cholecalciferol 25 MCG (1000 UT) tablet Take 1 tablet (1,000 Units total) by mouth daily. 10/04/19  Yes Marin Olp, MD  Cranberry 425 MG CAPS Take 425 mg by mouth 2 (two) times daily.   Yes [provider]  docusate sodium (COLACE) 100 MG capsule Take 100 mg by mouth 2 (two) times daily.   Yes [provider]  furosemide (LASIX) 20 MG tablet Take 1.5 tablets (30 mg total) by mouth daily. 08/26/16  Yes Baldwin Jamaica, PA-C  HYDROcodone-acetaminophen (NORCO/VICODIN) 5-325 MG tablet TAKE (1) TABLET BY MOUTH TWICE DAILY AT 9AM AND 3PM. SEPARATE BY 6 HOURS FROM TEMAZEPAM. Patient taking differently: Take 1 tablet by mouth in the morning and at bedtime. AT 9AM AND 3PM. SEPARATE BY 6 HOURS FROM TEMAZEPAM.  02/08/20  Yes Marin Olp, MD  Multiple Vitamins-Minerals (CEROVITE ADVANCED FORMULA PO) Take 1 tablet by mouth daily. (0800)   Yes [provider]  omeprazole (PRILOSEC) 40 MG capsule Take 40 mg by mouth daily. 08/30/17  Yes [provider]  ondansetron (ZOFRAN) 4 MG tablet Take 4 mg by mouth every 6 (six) hours as needed for nausea.   Yes [provider]  polyethylene glycol (MIRALAX / GLYCOLAX) packet Take 17 g by mouth See admin instructions. Mix 17 grams in 8 ounces of juice/water and drink daily, Monday through Saturday   Yes [provider]  sertraline (ZOLOFT) 25 MG tablet Take 1 tablet (25 mg total) by mouth daily. 06/25/19  Yes Marin Olp, MD  temazepam (RESTORIL) 15 MG capsule Take 1 capsule (15 mg total) by mouth at bedtime. 01/24/20  Yes Marin Olp, MD     Family History  Problem Relation Age of Onset  .  Heart disease Mother   . Hypertension Father   . Other Sister        103 in 2019  . Other Sister        35 in 2019  . Heart disease Sister   . Heart attack Neg Hx   . Stroke Neg Hx     Social History   Socioeconomic History  . Marital status: Widowed    Spouse name: Not on file  . Number of children: Not on file  . Years of education: Not on file  . Highest education level: Not on file  Occupational History  . Not on file  Tobacco Use  . Smoking status: Never Smoker  . Smokeless tobacco: Never Used  Vaping Use  . Vaping Use: Never used  Substance and Sexual Activity  . Alcohol use: No  . Drug use: No  . Sexual activity: Never  Other Topics Concern  . 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  All other systems are negative.  Review of Systems  Vital Signs: BP (!) 161/67   Pulse (!) 56   Temp 97.7 F (36.5 C) (Oral)   Resp 14   Ht 5' (1.524 m)   Wt 50.8 kg   SpO2 100%   BMI 21.87 kg/m   Physical Exam Vitals reviewed.  Constitutional:      Comments: Elderly , frail  HENT:     Head: Normocephalic and atraumatic.  Eyes:     Extraocular Movements: Extraocular movements intact.  Cardiovascular:     Rate and Rhythm: Normal rate and regular rhythm.  Pulmonary:     Effort: Pulmonary effort is normal. No respiratory distress.     Breath sounds: Normal breath sounds.  Abdominal:     Palpations: Abdomen is soft.  Musculoskeletal:        General: Normal range of motion.  Skin:    General: Skin is warm and dry.  Neurological:     General: No focal deficit present.     Mental Status: She is alert and oriented to person, place, and time.  Psychiatric:        Mood and Affect: Mood normal.        Behavior: Behavior normal.        Thought Content: Thought content normal.        Judgment: Judgment normal.     Imaging: CLINICAL DATA:  86 year old female with T7 compression fracture.  EXAM: CT THORACIC SPINE WITHOUT CONTRAST  TECHNIQUE: Multidetector CT images of the thoracic were obtained using the standard protocol without intravenous contrast.  COMPARISON:  Thoracic spine radiographs 01/08/2020. CTA chest 02/11/2019.  FINDINGS: Limited cervical spine imaging: Stable cervicothoracic junction since the 2020 chest  CTA, slight anterolisthesis with mild degenerative facet hypertrophy.  Thoracic spine  segmentation:  Normal.  Alignment: Focal thoracic kyphosis about the abnormal T7 vertebral body, about 38 degrees. Mild dextroconvex thoracic scoliosis has also developed associated with T7. No thoracic spondylolisthesis.  Vertebrae: T7 vertebra plana with roughly 7 mm of retropulsed bone (series 7, image 36) resulting in 50% stenosis of the spinal canal at that level. Posterior elements remain normally aligned at T7.  Superimposed anterior superior T8 vertebral body compression.  Elsewhere thoracic vertebral height is stable from the 2020 CTA. L1 appears stable and intact. Visible posterior ribs appear intact.  Paraspinal and other soft tissues: There is mild thoracic paraspinal hematoma associated with the T7 vertebral deformity (series 3, image 69). Other thoracic paraspinal soft tissues are negative.  Calcified aortic atherosclerosis. Calcified coronary artery atherosclerosis. Stable cardiomegaly. No pericardial effusion. Stable visible lung parenchyma, noncontrast upper abdominal viscera.  Disc levels:  Mild for age superimposed thoracic disc degeneration. Aside from the T7 level the thoracic spinal canal is capacious, with no other evidence of thoracic spinal stenosis.  IMPRESSION: 1. Severe compression fracture of T7 (vertebra plana) is estimated to be subacute, although Noncontrast MRI or whole-body bone scan would best determine acuity. Bulky retropulsed bone results in 50% stenosis of the spinal canal. 2. Superimposed mild anterior superior T8 vertebral body compression fracture. 3. No other acute osseous abnormality in the thoracic spine. Mild for age superimposed thoracic spine degeneration. 4. Aortic Atherosclerosis (ICD10-I70.0).  Cardiomegaly.   Electronically Signed   By: Genevie Ann M.D.   On: 02/01/2020 20:26      Labs:  CBC: Recent Labs    06/10/19 0245 06/11/19 0341 06/12/19 0254 06/13/19 0317 06/25/19 1058 12/18/19 0918  WBC 7.0  6.3  --   --  5.5 5.2  HGB 9.5* 9.1* 9.3* 9.7* 11.9* 12.6  HCT 29.6* 28.8* 28.4* 30.7* 35.3* 38.8  PLT 149* 157  --   --  346.0 215    COAGS: Recent Labs    06/07/19 0625  INR 1.2    BMP: Recent Labs    06/08/19 0306 06/09/19 0918 06/11/19 0341 06/25/19 1058 12/18/19 0918  NA 136 137 137 136 138  K 3.7 4.1 3.9 4.5 4.8  CL 101 104 104 98 98  CO2 25 24 24  32 26  GLUCOSE 145* 99 106* 115* 79  BUN 24* 23 27* 24* 23  CALCIUM 8.3* 7.9* 7.6* 9.2 9.0  CREATININE 0.86 0.79 0.74 0.69 0.76  GFRNONAA 59* >60 >60  --  68  GFRAA >60 >60 >60  --  78    LIVER FUNCTION TESTS: Recent Labs    06/06/19 2102 06/08/19 0306 06/11/19 0341 06/25/19 1058 03/03/20 1530  BILITOT 0.4 0.9  --  0.5 0.2  AST 22 19  --  17 15  ALT 12 10  --  7 10  ALKPHOS 115 71  --  103 120  PROT 8.2* 5.9*  --  6.9 7.4  ALBUMIN 4.4 2.8* 2.3* 3.8 4.3    TUMOR MARKERS: No results for input(s): AFPTM, CEA, CA199, CHROMGRNA in the last 8760 hours.  Assessment and Plan:  CT done 02/01/20 showed a T7 compression fracture and an anterior/superior endplate fracture of T8.    Will proceed with cement augmentation of T7 and T8 today by Dr. Earleen Newport.  Risks and benefits of kyphoplasty were discussed with the patient including, but not limited to education regarding the natural healing process of compression fractures without intervention, bleeding, infection,  cement migration which may cause spinal cord damage, paralysis, pulmonary embolism or even death.  This interventional procedure involves the use of X-rays and because of the nature of the planned procedure, it is possible that we will have prolonged use of X-ray fluoroscopy.  Potential radiation risks to you include (but are not limited to) the following: - A slightly elevated risk for cancer  several years later in life. This risk is typically less than 0.5% percent. This risk is low in comparison to the normal incidence of human cancer, which is 33% for  women and 50% for men according to the [place]. - Radiation induced injury can include skin redness, resembling a rash, tissue breakdown / ulcers and hair loss (which can be temporary or permanent).   The likelihood of either of these occurring depends on the difficulty of the procedure and whether you are sensitive to radiation due to previous procedures, disease, or genetic conditions.   IF your procedure requires a prolonged use of radiation, you will be notified and given written instructions for further action.  It is your responsibility to monitor the irradiated area for the 2 weeks following the procedure and to notify your physician if you are concerned that you have suffered a radiation induced injury.    All of the patient's questions were answered, patient is agreeable to proceed.  Consent signed and in chart.  Thank you for this interesting consult.  I greatly enjoyed meeting Glenda [name] and look forward to participating in their care.  A copy of this report was sent to the requesting provider on this date.  Electronically Signed: Murrell Redden, PA-C   03/06/2020, 11:31 AM      I spent a total of    25 Minutes in face to face in clinical consultation, greater than 50% of which was counseling/coordinating care for kyphoplasty.

## 2020-03-11 ENCOUNTER — Other Ambulatory Visit (HOSPITAL_COMMUNITY): Payer: Self-pay | Admitting: Interventional Radiology

## 2020-03-11 ENCOUNTER — Encounter (HOSPITAL_COMMUNITY): Payer: Self-pay

## 2020-03-11 DIAGNOSIS — S22060A Wedge compression fracture of T7-T8 vertebra, initial encounter for closed fracture: Secondary | ICD-10-CM

## 2020-03-18 ENCOUNTER — Telehealth: Payer: Self-pay

## 2020-03-18 NOTE — Telephone Encounter (Signed)
Called and spoke with [facility] and they state that an order needs to be faxed to [phone] saying that she can restart her Eliquis. [place] or Francena Hanly can one of you all handle this please?

## 2020-03-18 NOTE — Telephone Encounter (Signed)
Ok with me. Please place any necessary orders. 

## 2020-03-18 NOTE — Telephone Encounter (Signed)
Burna Mortimer from [facility] calling and needs orders for the patient to start restart taking apixaban (ELIQUIS) 2.5 MG TABS tablet since she stopped prior to her surgery. Call back [phone] and ask for med tec AL.

## 2020-03-18 NOTE — Telephone Encounter (Signed)
Is this ok?

## 2020-03-20 ENCOUNTER — Telehealth: Payer: Self-pay

## 2020-03-20 ENCOUNTER — Telehealth: Payer: Self-pay | Admitting: Cardiology

## 2020-03-20 NOTE — Telephone Encounter (Signed)
Patient son brought in Disability parking placard paperwork putting in Dr. Durene Cal folder.

## 2020-03-20 NOTE — Telephone Encounter (Signed)
Glenda [name] is calling stating [name] is needing an order to start her Eliquis back due to stopping it for the procedure she had performed on 03/05/20. Please advise.

## 2020-03-20 NOTE — Telephone Encounter (Signed)
Spoke with wanda, they have called the surgeon's office for clearance for the patient to restart eliquis and they have not heard from them. This note will be faxed to wanda at 336 [phone], with the okay for patient to restart eliquis 2.5 mg one tablet by mouth twice daily.

## 2020-03-24 NOTE — Telephone Encounter (Signed)
Noted  

## 2020-03-25 NOTE — Telephone Encounter (Signed)
Order has been placed in [place]'s box to sign.

## 2020-03-26 ENCOUNTER — Telehealth: Payer: Self-pay | Admitting: Cardiology

## 2020-03-26 NOTE — Telephone Encounter (Signed)
Returned call to patient of Dr. Jens Som who reports swelling in her feet/ankles. She noticed the symptoms about 1 week after increasing amlodipine to 10mg  daily. She states she had issues w/amlodipine causing swelling in the past. She denies weight gain. No other concerns. Her assisted living checks BP occasionally and she states it was OK.   OK to leave a message if she is unavailable   Will route to MD to review/advise

## 2020-03-26 NOTE — Telephone Encounter (Signed)
Left message for pt to call.

## 2020-03-26 NOTE — Telephone Encounter (Signed)
Change amlodipine to 5 mg daily; add hydralazine 25 mg BID; follow BP Glenda [name]

## 2020-03-26 NOTE — Telephone Encounter (Signed)
Pt c/o swelling: STAT is pt has developed SOB within 24 hours  1) How much weight have you gained and in what time span? N/A  2) If swelling, where is the swelling located? Both feet and ankles  3) Are you currently taking a fluid pill? Yes   4) Are you currently SOB? No  5) Do you have a log of your daily weights (if so, list)? No  6) Have you gained 3 pounds in a day or 5 pounds in a week? No  7) Have you traveled recently? No

## 2020-03-28 MED ORDER — HYDRALAZINE HCL 25 MG PO TABS: 25.0000 mg | ORAL_TABLET | Freq: Two times a day (BID) | ORAL | 3 refills | Status: DC

## 2020-03-28 NOTE — Telephone Encounter (Signed)
Routed to primary nurse to assist w/fax

## 2020-03-28 NOTE — Telephone Encounter (Signed)
Will fax this note to [place] at [facility].

## 2020-03-28 NOTE — Telephone Encounter (Signed)
Helene Kelp from [facility] states they need an order to change the patients medications. Fax: [phone]

## 2020-03-31 ENCOUNTER — Other Ambulatory Visit: Payer: Self-pay | Admitting: Interventional Radiology

## 2020-03-31 DIAGNOSIS — S22000A Wedge compression fracture of unspecified thoracic vertebra, initial encounter for closed fracture: Secondary | ICD-10-CM

## 2020-04-11 ENCOUNTER — Telehealth: Payer: Self-pay

## 2020-04-11 ENCOUNTER — Ambulatory Visit: Payer: Medicare Other

## 2020-04-11 DIAGNOSIS — I5032 Chronic diastolic (congestive) heart failure: Secondary | ICD-10-CM

## 2020-04-11 DIAGNOSIS — U071 COVID-19: Secondary | ICD-10-CM

## 2020-04-11 DIAGNOSIS — I10 Essential (primary) hypertension: Secondary | ICD-10-CM

## 2020-04-11 NOTE — Telephone Encounter (Signed)
Glenda [name] is calling in stating he dropped off a form for handicap sticker a few weeks ago, and recently missed a call. Wanted to know if it was in regards to this.

## 2020-04-11 NOTE — Telephone Encounter (Signed)
[facility] called in stating patient has tested positive for covid. She is experiencing tremors in her upper extremeties bilaterally for 2 days. She also has edema bilaterally +2 in her feet, which the nurse states is unusual for pt.  [name] ( nurse personal cell)  Virtual appt can also be done on this nurse cell phone if needed she states.

## 2020-04-11 NOTE — Telephone Encounter (Signed)
Patient tested positive for COVID-19 on January 13 and developed headache around that time.  Patient also with increased edema with 2+ edema.  Headache has persisted.  We will refer her for COVID outpatient treatment- late in the course of her illness so not sure if she will qualify.  Also discussed doing Lasix 30 mg twice daily through the weekend instead of once daily (Saturday, Sunday, Monday). Limit salt intake.   On amlodipine 5mg - could reduce this as well if swelling persists.   Can also call in for Saturday clinic visit virtual

## 2020-04-11 NOTE — Telephone Encounter (Signed)
Glenda [name] tested positive on the 13 th. She states that the tremors started 2 days ago and lower extremities edema +2 in her feet. The patient has an headache but she not too concerned about that because that comes with headaches. [name] states that she would gladly do an VV with you as soon as possible for this patient.

## 2020-04-13 ENCOUNTER — Encounter: Payer: Self-pay | Admitting: Infectious Diseases

## 2020-04-13 NOTE — Progress Notes (Signed)
Referral received for COVID therapies:   Symptom onset: 1/13 Vaccinated: Completed PFizer 04-2019 Booster? Undocumented  Immunocompromised? no Qualifiers: age, HTN, atrial flutter, Hx Breast Ca  Given duration of symptoms unable to offer any treatment based off EUA. Will send message to referring provider.   [facility] referral could be considered to help with non-severe persistent symptoms   Glenda [name]

## 2020-04-14 NOTE — Telephone Encounter (Signed)
For has been filled out and placed in Dr. Yong Channel folder to sign. Call son when ready.

## 2020-04-16 ENCOUNTER — Telehealth: Payer: Medicare Other

## 2020-04-18 ENCOUNTER — Telehealth: Payer: Self-pay

## 2020-04-18 NOTE — Telephone Encounter (Signed)
Do we have anything sooner? If not, ok to keep appointment as is.

## 2020-04-18 NOTE — Telephone Encounter (Signed)
Need to schedule office visit to evaluate- looks like she has a vsiit on 2/3- if we have something sooner im willing to see her then

## 2020-04-18 NOTE — Telephone Encounter (Signed)
Glenda [name] from [facility] called regarding pts edema. Pt was prescribe lasix to take on weekends for her edema. Lasix have provided some relief but pt is still concerned about the edema and it is not resolving as quickly as she thought it would. Glenda [name] is asking what Dr. Yong Channel would like to do about this. Please advise.

## 2020-04-18 NOTE — Telephone Encounter (Signed)
See below

## 2020-04-21 ENCOUNTER — Telehealth: Payer: Self-pay

## 2020-04-21 NOTE — Telephone Encounter (Signed)
Initial Comment Med tech with [facility] [place], says pt of Dr. Ansel Bong has been having tremors and dizziness today Disp. Time Disposition Final User 04/18/2020 6:20:43 PM Send to [place], [place] 04/18/2020 6:24:08 PM [place], Teddi 04/18/2020 6:26:55 PM Page Completed Dava Najjar, Teddi Paging DoctorName Phone DateTime Result/Outcome Message Type Notes Waunita Schooner- MD [phone] 04/18/2020 6:24:08 PM Called Office - Relayed Information Doctor Paged Waunita Schooner- MD 04/18/2020 6:26:49 PM Spoke with On Call - General Message Result Warm transferred Dr. Einar Pheasant to [place]. Call Closed By: Christain [place] Transaction Date/Time: 04/18/2020 6:17:21 PM (ET

## 2020-04-21 NOTE — Telephone Encounter (Signed)
Pt is scheduled for 04/24/20.

## 2020-04-23 NOTE — Progress Notes (Signed)
Phone [phone] In person visit   Subjective:   Glenda [name] is a 87 y.o. year old very pleasant female patient who presents for/with See problem oriented charting Chief Complaint  Patient presents with  . Hypertension  . Anxiety  . Insomnia    With current medication regimen, getting around 8-10 hours nightly   . Shortness of Breath    Started a couple weeks ago  . Leg Swelling    2-3 weeks, denies any changes in size or pain, states she has had issues with Amlodipine in the past - believes this is causing current swelling   . Dizziness    Constant but worse when moving around   . Shaking    Constant, states at times she is unable to hold a glass of water   This visit occurred during the SARS-CoV-2 public health emergency.  Safety protocols were in place, including screening questions prior to the visit, additional usage of staff PPE, and extensive cleaning of exam room while observing appropriate contact time as indicated for disinfecting solutions.   Past Medical History-  Patient Active Problem List   Diagnosis Date Noted  . Incarcerated ventral hernia 06/06/2019    Priority: High  . DNR (do not resuscitate) 06/06/2019    Priority: High  . Chronic pain syndrome 02/09/2018    Priority: High  . Chronic narcotic use 08/11/2017    Priority: High  . Malignant neoplasm of upper-outer quadrant of left breast in female, estrogen receptor positive ([place]) 06/27/2017    Priority: High  . Atrial flutter ([place])     Priority: High  . Chronic diastolic HF (heart failure) ([place]) 07/11/2012    Priority: High  . Osteoarthritis 04/03/2012    Priority: High  . Macular degeneration 04/24/2020    Priority: Medium  . Bradycardia 02/20/2019    Priority: Medium  . Anxiety 08/08/2018    Priority: Medium  . Shortness of breath 12/13/2014    Priority: Medium  . Essential hypertension 03/25/2011    Priority: Medium  . Insomnia 09/15/2010    Priority: Medium  . Status post total  replacement of right hip 02/05/2019    Priority: Low  . Osteoarthritis of right hip 02/01/2019    Priority: Low  . History of cardioversion 08/20/2016    Priority: Low  . Chronic anticoagulation-Eliquis 02/04/2016    Priority: Low  . Palpitations 12/13/2014    Priority: Low  . Constipation 11/06/2012    Priority: Low  . Accelerated/malignant hypertension 09/14/2012    Priority: Low  . Anemia 06/10/2011    Priority: Low  . Paroxysmal atrial fibrillation ([place]) 01/21/2020  . Secondary hypercoagulable state ([place]) 12/31/2019  . Osteopenia of neck of left femur 10/04/2019    Medications- reviewed and updated Current Outpatient Medications  Medication Sig Dispense Refill  . amiodarone (PACERONE) 200 MG tablet Take 100 mg by mouth daily.    Marland Kitchen anastrozole (ARIMIDEX) 1 MG tablet Take 1 tablet (1 mg total) by mouth daily. 90 tablet 3  . apixaban (ELIQUIS) 2.5 MG TABS tablet Take 1 tablet (2.5 mg total) by mouth 2 (two) times daily. 60 tablet 4  . Cholecalciferol 25 MCG (1000 UT) tablet Take 1 tablet (1,000 Units total) by mouth daily. 90 tablet 3  . Cranberry 425 MG CAPS Take 425 mg by mouth 2 (two) times daily.    Marland Kitchen docusate sodium (COLACE) 100 MG capsule Take 100 mg by mouth 2 (two) times daily.    . furosemide (LASIX) 20 MG tablet  Take 1.5 tablets (30 mg total) by mouth daily. 45 tablet 3  . hydrALAZINE (APRESOLINE) 25 MG tablet Take 1 tablet (25 mg total) by mouth 2 (two) times daily. 180 tablet 3  . Multiple Vitamins-Minerals (CEROVITE ADVANCED FORMULA PO) Take 1 tablet by mouth daily. (0800)    . omeprazole (PRILOSEC) 40 MG capsule Take 40 mg by mouth daily.    . ondansetron (ZOFRAN) 4 MG tablet Take 4 mg by mouth every 6 (six) hours as needed for nausea.    . polyethylene glycol (MIRALAX / GLYCOLAX) packet Take 17 g by mouth See admin instructions. Mix 17 grams in 8 ounces of juice/water and drink daily, Monday through Saturday    . sertraline (ZOLOFT) 25 MG tablet Take 1 tablet (25  mg total) by mouth daily. 90 tablet 3  . temazepam (RESTORIL) 15 MG capsule Take 1 capsule (15 mg total) by mouth at bedtime. 30 capsule 5  . HYDROcodone-acetaminophen (NORCO/VICODIN) 5-325 MG tablet TAKE (1) TABLET BY MOUTH TWICE DAILY AT 9AM AND 3PM. SEPARATE BY 6 HOURS FROM TEMAZEPAM. 30 tablet 0   No current facility-administered medications for this visit.     Objective:  BP (!) 146/78   Pulse (!) 53   Temp (!) 97.3 F (36.3 C) (Temporal)   Resp 17   Ht 5' (1.524 m)   Wt 111 lb 6.4 oz (50.5 kg)   SpO2 97%   BMI 21.76 kg/m  Gen: NAD, resting comfortably CV: RRR no murmurs rubs or gallops Lungs: CTAB no crackles, wheeze, rhonchi Abdomen: soft/nontender/nondistended/normal bowel sounds.  Ext: 1-2+ edema Skin: warm, dry     Assessment and Plan   #  tremor S: started a few weeks ago starting around the time she had covid.  Tested positive on January 13th. May be more anxious overall since covid. Feels shaky with movement- for most part ok at rest. Today did have slight tremor with rest but after some movement this became more pronounced even when she went back to rest. Worse in right arm- but feels like through uppe rbody A/P:  Patient also stopped metoprolol in march 2021 but no recent tremor until last month. I do not have a good explanation for this- some resting tremor but worsened with movement. Not classically essential or parkinsonian tremor- would like neuro input. Could have anxiety element- continue zoloft 25 mg  # Shortness of breath in patient with CHF S:Reports swelling and shortness of breath in past on amiodarone. Reports cardiologist restarted - appears November 2021 - looks like was reduced to 100mg  in December. She had her amloidpine increased to 10mg  as well initially. She feels like SOB started with amiodarone on one hand but on other hand reports it mainly started in last few weeks   When patient had covid 19 we received a call aroudn 04/11/20 from nursing at  her facility and they noted 2+ edema- we did lasix 30mg  twice daily for 3 days and helped short term now back on daily 30mg  lasix.  Now down to 1-2+ edema. We decreased amlodipine back to 5 mg in case contributing to edema.   No chest pain. More thirsty lately and prior a1c 2018 in 5.7 A/P: with ongoing edema concerned CHF may be poorly controlled- trial lasix 30mg  BID for 2 weeks and then reassess. I wonder if SOB with activity could be contributing to tremors as well- will see if she improves and cancel neurology visit if needed -also get CXR, BNP, cbc, cmp today as  well as TSH.    #hypertension S: medication: amlodipien 5 mg,, lasix 30mg , hydralazine 25 mg BID BP Readings from Last 3 Encounters:  04/24/20 (!) 146/78  03/06/20 139/83  03/06/20 117/90  A/P: poor control but could be fluid overloaded- increase lasix to BID short term and hoping this is beneficial  #chronic pain syndrome- did refill hydrocodone which she takes BID and makes days tolerable and then on temazepam at night for sleep. This really helps quality of life but I also wonder if polypharmacy could contribute to some of her symptoms- these would be high targets for me to try to wean but has not tolerated being off these in the past and I do not have a great alternate at this time.   Recommended follow up: 3 month follow up recommended but also 2 week recheck on fluid status (sooner if worsenign symptoms recommended) Future Appointments  Date Time Provider [place]  04/29/2020 10:00 AM [facility]  05/06/2020  1:30 PM Bernarda Caffey, MD [facility] None  09/02/2020 10:40 AM Lelon Perla, MD [facility]  10/21/2020 11:00 AM Nicholas Lose, MD [facility] None    Lab/Order associations:   ICD-10-CM   1. Essential hypertension  I10   2. Anxiety  F41.9   3. Macular degeneration of both eyes, unspecified type  H35.30   4. DOE (dyspnea on exertion)  R06.00 DG Chest 2 View    CBC with  Differential/Platelet    Comprehensive metabolic panel    B Nat Peptide  5. Tremor  R25.1 Ambulatory referral to Neurology    TSH  6. Hyperglycemia  R73.9 Hemoglobin A1c  7. Chronic diastolic HF (heart failure) (H[facility])  I50.32   8. Chronic pain syndrome  G89.4     Meds ordered this encounter  Medications  . HYDROcodone-acetaminophen (NORCO/VICODIN) 5-325 MG tablet    Sig: TAKE (1) TABLET BY MOUTH TWICE DAILY AT 9AM AND 3PM. SEPARATE BY 6 HOURS FROM TEMAZEPAM.    Dispense:  30 tablet    Refill:  0    Return precautions advised.  Garret Reddish, MD

## 2020-04-23 NOTE — Patient Instructions (Addendum)
Lets increase lasix to 30 mg twice daily for another 14 days and then follow up in 2 weeks (schedule before you leave). See [name] sooner if new or worsening symptoms.   Please stop by lab and x-ray before you go If you have mychart- we will send your results within 3 business days of [name] receiving them.  If you do not have mychart- we will call you about results within 5 business days of [name] receiving them.  *please also note that you will see labs on mychart as soon as they post. I will later go in and write notes on them- will say "notes from Dr. Yong Channel"  Make sure only on amiodarone 100mg - this was reduced in deccember by cardiology.   Continue amlodipine 5 mg alone for now  For neck pain try heating pad twice a day for 3 weeks  We will call you within two weeks about your referral to neurology for shakiness. If you do not hear within 2 weeks, give [name] a call.   I can refill pain medicine 2 more times before next in person visit needed- go ahead and schedule a visit in 3 months so we can do next refill

## 2020-04-24 ENCOUNTER — Ambulatory Visit (INDEPENDENT_AMBULATORY_CARE_PROVIDER_SITE_OTHER): Payer: Medicare Other | Admitting: Family Medicine

## 2020-04-24 ENCOUNTER — Encounter: Payer: Self-pay | Admitting: Family Medicine

## 2020-04-24 ENCOUNTER — Ambulatory Visit (INDEPENDENT_AMBULATORY_CARE_PROVIDER_SITE_OTHER): Payer: Medicare Other

## 2020-04-24 ENCOUNTER — Other Ambulatory Visit: Payer: Self-pay

## 2020-04-24 VITALS — BP 146/78 | HR 53 | Temp 97.3°F | Resp 17 | Ht 60.0 in | Wt 111.4 lb

## 2020-04-24 DIAGNOSIS — R0609 Other forms of dyspnea: Secondary | ICD-10-CM

## 2020-04-24 DIAGNOSIS — F419 Anxiety disorder, unspecified: Secondary | ICD-10-CM

## 2020-04-24 DIAGNOSIS — R739 Hyperglycemia, unspecified: Secondary | ICD-10-CM

## 2020-04-24 DIAGNOSIS — I10 Essential (primary) hypertension: Secondary | ICD-10-CM | POA: Diagnosis not present

## 2020-04-24 DIAGNOSIS — R251 Tremor, unspecified: Secondary | ICD-10-CM

## 2020-04-24 DIAGNOSIS — H3530 Unspecified macular degeneration: Secondary | ICD-10-CM | POA: Diagnosis not present

## 2020-04-24 DIAGNOSIS — I5032 Chronic diastolic (congestive) heart failure: Secondary | ICD-10-CM

## 2020-04-24 DIAGNOSIS — R0600 Dyspnea, unspecified: Secondary | ICD-10-CM

## 2020-04-24 DIAGNOSIS — G894 Chronic pain syndrome: Secondary | ICD-10-CM

## 2020-04-24 MED ORDER — HYDROCODONE-ACETAMINOPHEN 5-325 MG PO TABS: ORAL_TABLET | ORAL | 0 refills | Status: DC

## 2020-04-25 LAB — COMPREHENSIVE METABOLIC PANEL
ALT: 16 U/L (ref 0–35)
AST: 25 U/L (ref 0–37)
Albumin: 4.2 g/dL (ref 3.5–5.2)
Alkaline Phosphatase: 104 U/L (ref 39–117)
BUN: 39 mg/dL — ABNORMAL HIGH (ref 6–23)
CO2: 29 mEq/L (ref 19–32)
Calcium: 9.5 mg/dL (ref 8.4–10.5)
Chloride: 101 mEq/L (ref 96–112)
Creatinine, Ser: 1.24 mg/dL — ABNORMAL HIGH (ref 0.40–1.20)
GFR: 37.55 mL/min — ABNORMAL LOW (ref >60.00)
Glucose, Bld: 110 mg/dL — ABNORMAL HIGH (ref 70–99)
Potassium: 4.9 mEq/L (ref 3.5–5.1)
Sodium: 138 mEq/L (ref 135–145)
Total Bilirubin: 0.4 mg/dL (ref 0.2–1.2)
Total Protein: 8.1 g/dL (ref 6.0–8.3)

## 2020-04-25 LAB — CBC WITH DIFFERENTIAL/PLATELET
Basophils Absolute: 0 10*3/uL (ref 0.0–0.1)
Basophils Relative: 0.7 % (ref 0.0–3.0)
Eosinophils Absolute: 0.1 10*3/uL (ref 0.0–0.7)
Eosinophils Relative: 1.1 % (ref 0.0–5.0)
HCT: 34.7 % — ABNORMAL LOW (ref 36.0–46.0)
Hemoglobin: 11.8 g/dL — ABNORMAL LOW (ref 12.0–15.0)
Lymphocytes Relative: 14.4 % (ref 12.0–46.0)
Lymphs Abs: 0.8 10*3/uL (ref 0.7–4.0)
MCHC: 33.9 g/dL (ref 30.0–36.0)
MCV: 87.6 fl (ref 78.0–100.0)
Monocytes Absolute: 0.4 10*3/uL (ref 0.1–1.0)
Monocytes Relative: 8.3 % (ref 3.0–12.0)
Neutro Abs: 4 10*3/uL (ref 1.4–7.7)
Neutrophils Relative %: 75.5 % (ref 43.0–77.0)
Platelets: 213 10*3/uL (ref 150.0–400.0)
RBC: 3.96 Mil/uL (ref 3.87–5.11)
RDW: 17.6 % — ABNORMAL HIGH (ref 11.5–15.5)
WBC: 5.3 10*3/uL (ref 4.0–10.5)

## 2020-04-25 LAB — TSH: TSH: 6.29 u[IU]/mL — ABNORMAL HIGH (ref 0.35–4.50)

## 2020-04-25 LAB — HEMOGLOBIN A1C: Hgb A1c MFr Bld: 5.7 % (ref 4.6–6.5)

## 2020-04-25 LAB — BRAIN NATRIURETIC PEPTIDE: Pro B Natriuretic peptide (BNP): 180 pg/mL — ABNORMAL HIGH (ref 0.0–100.0)

## 2020-04-28 NOTE — Progress Notes (Signed)
[facility] Note  05/06/2020     CHIEF COMPLAINT Patient presents for Retina Follow Up   HISTORY OF PRESENT ILLNESS: Glenda [name] is a 87 y.o. female who presents to the clinic today for: HPI    Retina Follow Up    Patient presents with  Dry AMD.  In both eyes.  Severity is moderate.  Duration of 3.5 months.  Since onset it is gradually worsening.  I, the attending physician,  performed the HPI with the patient and updated documentation appropriately.          Comments    Patient states vision slightly worse OU. Faces on TV look blurred and reading fine print is difficult.        Last edited by Bernarda Caffey, MD on 05/06/2020  1:56 PM. (History)    Pt states she feels like her right eye vision has decreased, she states she is having problems with vertigo right now as well as headaches, she has a hard time reading small print  Referring physician: Marin Olp, MD [place],   [id number]  HISTORICAL INFORMATION:   Selected notes from the MEDICAL RECORD NUMBER Referred for ret eval OU   CURRENT MEDICATIONS: No current outpatient medications on file. (Ophthalmic Drugs)   No current facility-administered medications for this visit. (Ophthalmic Drugs)   Current Outpatient Medications (Other)  Medication Sig  . amiodarone (PACERONE) 200 MG tablet Take 100 mg by mouth daily.  Marland Kitchen anastrozole (ARIMIDEX) 1 MG tablet Take 1 tablet (1 mg total) by mouth daily.  Marland Kitchen apixaban (ELIQUIS) 2.5 MG TABS tablet Take 1 tablet (2.5 mg total) by mouth 2 (two) times daily.  . Cholecalciferol 25 MCG (1000 UT) tablet Take 1 tablet (1,000 Units total) by mouth daily.  . Cranberry 425 MG CAPS Take 425 mg by mouth 2 (two) times daily.  Marland Kitchen docusate sodium (COLACE) 100 MG capsule Take 100 mg by mouth 2 (two) times daily.  . furosemide (LASIX) 20 MG tablet Take 1.5 tablets (30 mg total) by mouth daily.  . hydrALAZINE (APRESOLINE) 25 MG tablet Take 1  tablet (25 mg total) by mouth 2 (two) times daily.  Marland Kitchen HYDROcodone-acetaminophen (NORCO/VICODIN) 5-325 MG tablet TAKE (1) TABLET BY MOUTH TWICE DAILY AT 9AM AND 3PM. SEPARATE BY 6 HOURS FROM TEMAZEPAM.  . Multiple Vitamins-Minerals (CEROVITE ADVANCED FORMULA PO) Take 1 tablet by mouth daily. (0800)  . omeprazole (PRILOSEC) 40 MG capsule Take 40 mg by mouth daily.  . ondansetron (ZOFRAN) 4 MG tablet Take 4 mg by mouth every 6 (six) hours as needed for nausea.  . polyethylene glycol (MIRALAX / GLYCOLAX) packet Take 17 g by mouth See admin instructions. Mix 17 grams in 8 ounces of juice/water and drink daily, Monday through Saturday  . sertraline (ZOLOFT) 25 MG tablet Take 1 tablet (25 mg total) by mouth daily.  . temazepam (RESTORIL) 15 MG capsule Take 1 capsule (15 mg total) by mouth at bedtime.   No current facility-administered medications for this visit. (Other)      REVIEW OF SYSTEMS: ROS    Positive for: Musculoskeletal, HENT, Cardiovascular, Eyes, Psychiatric   Negative for: Constitutional, Gastrointestinal, Neurological, Skin, Genitourinary, Endocrine, Respiratory, Allergic/Imm, Heme/Lymph   Last edited by Roselee Nova D, COT on 05/06/2020  1:34 PM. (History)       ALLERGIES Allergies  Allergen Reactions  . Ace Inhibitors Other (See Comments)    Cough  . Halcion [Triazolam]  UNSPECIFIED REACTION   . Pentazocine Lactate     UNSPECIFIED REACTION   . Sulfa Drugs Cross Reactors     UNSPECIFIED REACTION   . Trazodone And Nefazodone Other (See Comments)    Per MAR  . Amitriptyline Other (See Comments)    Hyperactivity  . Avelox [Moxifloxacin Hcl In Nacl] Other (See Comments)    dizziness  . Penicillins Rash    PAST MEDICAL HISTORY Past Medical History:  Diagnosis Date  . Aortic stenosis    Echo 02/2011 showing mild AS with normal LV systolic function  . Arthritis    "lower back" (10/09/2015)  . Atrial flutter with rapid ventricular response ([place]) 08/19/2016   s/p  successful DCCV on 08/20/16, continue eliquis  . Chronic bronchitis ([place])    "off and on; several years" (10/09/2015)  . Chronic diastolic CHF (congestive heart failure) ([place])    a. 12/2015 Echo: EF 55-60%, mild AI/MR, mildly dil LA.  Marland Kitchen Chronic lower back pain   . Complication of anesthesia    "they had trouble waking me up after colon resection"  . Confusion    Originally listed as TIA-pt denies this hx on 10/09/2015 "it was the [place] I was taking; they had thought I was having a st originally roke"  . DDD (degenerative disc disease), lumbar    severe facet dz and adv DDD MRI L spine 2009  . Diverticulosis   . Dysrhythmia    A-Fib  . Femur fracture, right ([place]) 10/18/2018  . GERD (gastroesophageal reflux disease)   . Hiatal hernia    hx  . Hypercholesterolemia   . Hypertension   . Insomnia   . OA (osteoarthritis) of knee   . Paroxysmal atrial flutter ([place])    a. 09/2015 s/p TEE/DCCV;  b. CHA2DS2VASc = 5-->Eliquis.  . Peripheral edema   . PVCs (premature ventricular contractions)   . Sacral fracture ([place]) 11/06/2012  . Vertigo 11/19/2012   Past Surgical History:  Procedure Laterality Date  . BREAST BIOPSY Left   . CARDIAC CATHETERIZATION  02/17/2001   MILD REGURGITATION. EF 60%  . CARDIOVERSION N/A 10/08/2015   Procedure: CARDIOVERSION;  Surgeon: Sanda Klein, MD;  Location: [facility];  Service: Cardiovascular;  Laterality: N/A;  . CARDIOVERSION N/A 08/20/2016   Procedure: Cardioversion;  Surgeon: Evans Lance, MD;  Location: [facility];  Service: Cardiovascular;  Laterality: N/A;  . CARDIOVERSION N/A 12/24/2019   Procedure: CARDIOVERSION;  Surgeon: Dorothy Spark, MD;  Location: [facility];  Service: Cardiovascular;  Laterality: N/A;  . CATARACT EXTRACTION W/ INTRAOCULAR LENS  IMPLANT, BILATERAL Bilateral   . [place]  . COLECTOMY     related to "blockage"  . COLONOSCOPY    . EXCISIONAL HEMORRHOIDECTOMY    . HIP PINNING,CANNULATED Right  10/19/2018   Procedure: Right percutaneous hip pinning;  Surgeon: Erle Crocker, MD;  Location: [facility];  Service: Orthopedics;  Laterality: Right;  . INCISIONAL HERNIA REPAIR N/A 06/07/2019   Procedure: Reduction and Repair of Incisional Hernia with Overlay Mesh;  Surgeon: Kinsinger, Arta Bruce, MD;  Location: [facility];  Service: General;  Laterality: N/A;  . INGUINAL HERNIA REPAIR Bilateral   . IR KYPHO THORACIC WITH BONE BIOPSY  03/06/2020  . IR RADIOLOGIST EVAL & MGMT  02/07/2020  . IR VERTEBROPLASTY CERV/THOR BX INC UNI/BIL INC/INJECT/IMAGING  03/06/2020  . KYPHOPLASTY Right 09/11/2012   Procedure: Right Acrylic Sacroplasty;  Surgeon: Kristeen Miss, MD;  Location: [facility];  Service: Neurosurgery;  Laterality:  Right;  Right  Acrylic Sacroplasty  . TEE WITHOUT CARDIOVERSION N/A 10/08/2015   Procedure: TRANSESOPHAGEAL ECHOCARDIOGRAM (TEE);  Surgeon: Sanda Klein, MD;  Location: [facility];  Service: Cardiovascular;  Laterality: N/A;  . TOTAL HIP ARTHROPLASTY Right 02/05/2019   Procedure: [place] TOTAL HIP ARTHROPLASTY ANTERIOR APPROACH AND REMOVAL OF SCREWS;  Surgeon: Frederik Pear, MD;  Location: [facility];  Service: Orthopedics;  Laterality: Right;  . TOTAL KNEE ARTHROPLASTY Right   . TOTAL MASTECTOMY Left 07/22/2017   Procedure: TOTAL MASTECTOMY;  Surgeon: Excell Seltzer, MD;  Location: [facility];  Service: General;  Laterality: Left;  . UMBILICAL HERNIA REPAIR      FAMILY HISTORY Family History  Problem Relation Age of Onset  . Heart disease Mother   . Hypertension Father   . Other Sister        48 in 2019  . Other Sister        10 in 2019  . Heart disease Sister   . Heart attack Neg Hx   . Stroke Neg Hx     SOCIAL HISTORY Social History   Tobacco Use  . Smoking status: Never Smoker  . Smokeless tobacco: Never Used  Vaping Use  . Vaping Use: Never used  Substance Use Topics  . Alcohol use: No  . Drug use: No         OPHTHALMIC EXAM:  Base Eye Exam    Visual  Acuity (Snellen - Linear)      Right Left   Dist cc 20/50 20/100 -2   Dist ph cc NI NI   Correction: Glasses       Tonometry (Tonopen, 1:49 PM)      Right Left   Pressure 13 14       Pupils      Dark Light Shape React APD   Right 3 2 Round Brisk None   Left 3 2 Round Brisk None       Visual Fields (Counting fingers)      Left Right    Full Full       Extraocular Movement      Right Left    Full, Ortho Full, Ortho       Neuro/Psych    Oriented x3: Yes   Mood/Affect: Normal       Dilation    Both eyes: 1.0% Mydriacyl, 2.5% Phenylephrine @ 1:49 PM        Slit Lamp and Fundus Exam    Slit Lamp Exam      Right Left   Lids/Lashes Dermatochalasis - upper lid, mild Meibomian gland dysfunction Dermatochalasis - upper lid, mild Meibomian gland dysfunction   Conjunctiva/Sclera White and quiet White and quiet   Cornea arcus, well healed temporal cataract wounds, 3+inferior Punctate epithelial erosions arcus, well healed temporal cataract wounds, 2-3+Punctate epithelial erosions   Anterior Chamber Deep and quiet Deep and quiet   Iris Round and dilated Round and dilated   Lens 3 piece Posterior chamber intraocular lens with open PC, anterior capsular phimosis 3 piece Posterior chamber intraocular lens, mild anterior capsular phimosis, open PC   Vitreous Vitreous syneresis Vitreous syneresis, Posterior vitreous detachment       Fundus Exam      Right Left   Disc mild Pallor, Sharp rim, Compact, +Peripapillary atrophy inferiorly mild Pallor, Sharp rim, temporal PPA   C/D Ratio 0.2 0.1   Macula Flat, Blunted foveal reflex, hard Drusen, RPE mottling, clumping and atrophy, punctate IRH superior macula - resolved Blunted foveal  reflex, Drusen, RPE mottling, clumping and atrophy, +PED, no heme, mild progression of atrophy centrally   Vessels attenuated, mild tortuousity attenuated, mild tortuousity   Periphery Attached, pigmented CR atrophy at 0500 midzone, scattered peripheral and  midzonal drusen, mild reticular degeneration, No heme  Attached, pigmented CR atrophy at 0500 midzone, scattered peripheral and midzonal drusen, mild reticular degeneration, No heme         Refraction    Wearing Rx      Sphere Cylinder Axis Add   Right [place] +0.50 051 +3.00   Left -1.50 +1.00 107 +3.00       Manifest Refraction      Sphere Cylinder Axis Dist VA   Right [place] +0.50 050 20/30-1   Left -2.00 +1.00 110 20/70-1          IMAGING AND PROCEDURES  Imaging and Procedures for 05/06/2020  OCT, Retina - OU - Both Eyes       Right Eye Quality was good. Central Foveal Thickness: 296. Progression has worsened. Findings include normal foveal contour, no IRF, retinal drusen , no SRF, pigment epithelial detachment, outer retinal atrophy (Mild interval increase in central PED).   Left Eye Quality was good. Central Foveal Thickness: 283. Progression has worsened. Findings include normal foveal contour, no IRF, retinal drusen , outer retinal atrophy, pigment epithelial detachment, intraretinal hyper-reflective material, no SRF (Interval conversion of central PEDs to ORA).   Notes *Images captured and stored on drive  Diagnosis / Impression:  NFP; no IRF OU +drusen/PED OU Nonexudative ARMD OU OD: Mild interval increase in central PED and overlying ORA OS: Interval conversion of central PEDs to ORA   Clinical management:  See below  Abbreviations: NFP - Normal foveal profile. CME - cystoid macular edema. PED - pigment epithelial detachment. IRF - intraretinal fluid. SRF - subretinal fluid. EZ - ellipsoid zone. ERM - epiretinal membrane. ORA - outer retinal atrophy. ORT - outer retinal tubulation. SRHM - subretinal hyper-reflective material. IRHM - intraretinal hyper-reflective material                 ASSESSMENT/PLAN:    ICD-10-CM   1. Intermediate stage nonexudative age-related macular degeneration of both eyes  H35.3132   2. Retinal edema  H35.81 OCT, Retina -  OU - Both Eyes  3. Essential hypertension  I10   4. Hypertensive retinopathy of both eyes  H35.033   5. Pseudophakia, both eyes  Z96.1   6. Dry eyes  H04.123     1,2. Age related macular degeneration, non-exudative, OU (OS > OD)  - today, exam and OCT shows interval progression of atrophy OU  - BCVA OD 20/50 (decreased), OS 20/100 (decreased from 20/70) -- +corneal dryness as well  - discussed findings and progression  - no exudative disease  - continue AREDS2 and [place] grid monitoring  - f/u 3-4 months, sooner prn DFE, OCT  3,4. Hypertensive retinopathy OU - discussed importance of tight BP control - monitor  5. Pseudophakia OU  - s/p CE/IOL OU  - IOL in good position, doing well  - monitor  6. Dry eyes OU  - recommend artificial tears and lubricating ointment as needed    Ophthalmic Meds Ordered this visit:  No orders of the defined types were placed in this encounter.      Return for f/u 3-4 months, exu ARMD OU, DFE, OCT.  There are no Patient Instructions on file for this visit.   Explained the diagnoses, plan, and follow up with the  patient and they expressed understanding.  Patient expressed understanding of the importance of proper follow up care.  This document serves as a record of services personally performed by Gardiner Sleeper, MD, PhD. It was created on their behalf by Estill Bakes, COT an ophthalmic technician. The creation of this record is the provider's dictation and/or activities during the visit.    Electronically signed by: Estill Bakes, COT 2.7.22 @ 4:45 PM   This document serves as a record of services personally performed by Gardiner Sleeper, MD, PhD. It was created on their behalf by San Jetty. Owens Shark, OA an ophthalmic technician. The creation of this record is the provider's dictation and/or activities during the visit.    Electronically signed by: San Jetty. Owens Shark, [place] 02.15.2022 4:45 PM  Gardiner Sleeper, M.D., Ph.D. Diseases & Surgery of the  Retina and [place] 05/06/2020   I have reviewed the above documentation for accuracy and completeness, and I agree with the above. Gardiner Sleeper, M.D., Ph.D. 05/06/20 4:45 PM   Abbreviations: M myopia (nearsighted); A astigmatism; H hyperopia (farsighted); P presbyopia; Mrx spectacle prescription;  CTL contact lenses; OD right eye; OS left eye; OU both eyes  XT exotropia; ET esotropia; PEK punctate epithelial keratitis; PEE punctate epithelial erosions; DES dry eye syndrome; MGD meibomian gland dysfunction; ATs artificial tears; PFAT's preservative free artificial tears; [place] nuclear sclerotic cataract; PSC posterior subcapsular cataract; ERM epi-retinal membrane; PVD posterior vitreous detachment; RD retinal detachment; DM diabetes mellitus; DR diabetic retinopathy; NPDR non-proliferative diabetic retinopathy; PDR proliferative diabetic retinopathy; CSME clinically significant macular edema; DME diabetic macular edema; dbh dot blot hemorrhages; CWS cotton wool spot; POAG primary open angle glaucoma; C/D cup-to-disc ratio; HVF humphrey visual field; GVF goldmann visual field; OCT optical coherence tomography; IOP intraocular pressure; BRVO Branch retinal vein occlusion; CRVO central retinal vein occlusion; CRAO central retinal artery occlusion; BRAO branch retinal artery occlusion; RT retinal tear; SB scleral buckle; PPV pars plana vitrectomy; VH Vitreous hemorrhage; PRP panretinal laser photocoagulation; IVK intravitreal kenalog; VMT vitreomacular traction; MH Macular hole;  NVD neovascularization of the disc; NVE neovascularization elsewhere; AREDS age related eye disease study; ARMD age related macular degeneration; POAG primary open angle glaucoma; EBMD epithelial/anterior basement membrane dystrophy; ACIOL anterior chamber intraocular lens; IOL intraocular lens; PCIOL posterior chamber intraocular lens; Phaco/IOL phacoemulsification with intraocular lens placement;  [place] photorefractive keratectomy; LASIK laser assisted in situ keratomileusis; HTN hypertension; DM diabetes mellitus; COPD chronic obstructive pulmonary disease

## 2020-04-29 ENCOUNTER — Other Ambulatory Visit: Payer: Medicare Other

## 2020-05-06 ENCOUNTER — Ambulatory Visit (INDEPENDENT_AMBULATORY_CARE_PROVIDER_SITE_OTHER): Payer: Medicare Other | Admitting: Ophthalmology

## 2020-05-06 ENCOUNTER — Encounter (INDEPENDENT_AMBULATORY_CARE_PROVIDER_SITE_OTHER): Payer: Self-pay | Admitting: Ophthalmology

## 2020-05-06 ENCOUNTER — Other Ambulatory Visit: Payer: Self-pay

## 2020-05-06 DIAGNOSIS — H3581 Retinal edema: Secondary | ICD-10-CM

## 2020-05-06 DIAGNOSIS — I10 Essential (primary) hypertension: Secondary | ICD-10-CM

## 2020-05-06 DIAGNOSIS — H353132 Nonexudative age-related macular degeneration, bilateral, intermediate dry stage: Secondary | ICD-10-CM

## 2020-05-06 DIAGNOSIS — H04123 Dry eye syndrome of bilateral lacrimal glands: Secondary | ICD-10-CM

## 2020-05-06 DIAGNOSIS — Z961 Presence of intraocular lens: Secondary | ICD-10-CM

## 2020-05-06 DIAGNOSIS — H35033 Hypertensive retinopathy, bilateral: Secondary | ICD-10-CM | POA: Diagnosis not present

## 2020-05-08 ENCOUNTER — Encounter: Payer: Self-pay | Admitting: Family Medicine

## 2020-05-08 ENCOUNTER — Other Ambulatory Visit: Payer: Self-pay

## 2020-05-08 ENCOUNTER — Ambulatory Visit (INDEPENDENT_AMBULATORY_CARE_PROVIDER_SITE_OTHER): Payer: Medicare Other | Admitting: Family Medicine

## 2020-05-08 VITALS — BP 136/72 | HR 51 | Temp 97.2°F | Ht 60.0 in | Wt 113.8 lb

## 2020-05-08 DIAGNOSIS — I483 Typical atrial flutter: Secondary | ICD-10-CM | POA: Diagnosis not present

## 2020-05-08 DIAGNOSIS — I5032 Chronic diastolic (congestive) heart failure: Secondary | ICD-10-CM

## 2020-05-08 DIAGNOSIS — R7989 Other specified abnormal findings of blood chemistry: Secondary | ICD-10-CM

## 2020-05-08 DIAGNOSIS — I10 Essential (primary) hypertension: Secondary | ICD-10-CM

## 2020-05-08 NOTE — Progress Notes (Signed)
Phone [phone] In person visit   Subjective:   Glenda [name] is a 87 y.o. year old very pleasant female patient who presents for/with See problem oriented charting Chief Complaint  Patient presents with  . Edema    Feet and ankles   . Dizziness    Patient states that its all the time    This visit occurred during the SARS-CoV-2 public health emergency.  Safety protocols were in place, including screening questions prior to the visit, additional usage of staff PPE, and extensive cleaning of exam room while observing appropriate contact time as indicated for disinfecting solutions.   Past Medical History-  Patient Active Problem List   Diagnosis Date Noted  . Incarcerated ventral hernia 06/06/2019    Priority: High  . DNR (do not resuscitate) 06/06/2019    Priority: High  . Chronic pain syndrome 02/09/2018    Priority: High  . Chronic narcotic use 08/11/2017    Priority: High  . Malignant neoplasm of upper-outer quadrant of left breast in female, estrogen receptor positive ([place]) 06/27/2017    Priority: High  . Atrial flutter ([place])     Priority: High  . Chronic diastolic HF (heart failure) ([place]) 07/11/2012    Priority: High  . Osteoarthritis 04/03/2012    Priority: High  . Macular degeneration 04/24/2020    Priority: Medium  . Bradycardia 02/20/2019    Priority: Medium  . Anxiety 08/08/2018    Priority: Medium  . Shortness of breath 12/13/2014    Priority: Medium  . Essential hypertension 03/25/2011    Priority: Medium  . Insomnia 09/15/2010    Priority: Medium  . Status post total replacement of right hip 02/05/2019    Priority: Low  . Osteoarthritis of right hip 02/01/2019    Priority: Low  . History of cardioversion 08/20/2016    Priority: Low  . Chronic anticoagulation-Eliquis 02/04/2016    Priority: Low  . Palpitations 12/13/2014    Priority: Low  . Constipation 11/06/2012    Priority: Low  . Accelerated/malignant hypertension 09/14/2012     Priority: Low  . Anemia 06/10/2011    Priority: Low  . Paroxysmal atrial fibrillation ([place]) 01/21/2020  . Secondary hypercoagulable state ([place]) 12/31/2019  . Osteopenia of neck of left femur 10/04/2019    Medications- reviewed and updated Current Outpatient Medications  Medication Sig Dispense Refill  . amiodarone (PACERONE) 200 MG tablet Take 100 mg by mouth daily.    Marland Kitchen amLODipine (NORVASC) 5 MG tablet Take 5 mg by mouth daily.    Marland Kitchen anastrozole (ARIMIDEX) 1 MG tablet Take 1 tablet (1 mg total) by mouth daily. 90 tablet 3  . apixaban (ELIQUIS) 2.5 MG TABS tablet Take 1 tablet (2.5 mg total) by mouth 2 (two) times daily. (Patient taking differently: Take 5 mg by mouth 2 (two) times daily.) 60 tablet 4  . ARTIFICIAL TEARS 0.2-0.2-1 % SOLN Apply to eye.    . Cholecalciferol 25 MCG (1000 UT) tablet Take 1 tablet (1,000 Units total) by mouth daily. 90 tablet 3  . Cranberry 425 MG CAPS Take 425 mg by mouth 2 (two) times daily.    Marland Kitchen docusate sodium (COLACE) 100 MG capsule Take 100 mg by mouth 2 (two) times daily.    . furosemide (LASIX) 20 MG tablet Take 1.5 tablets (30 mg total) by mouth daily. 45 tablet 3  . hydrALAZINE (APRESOLINE) 25 MG tablet Take 1 tablet (25 mg total) by mouth 2 (two) times daily. 180 tablet 3  . HYDROcodone-acetaminophen (NORCO/VICODIN)  5-325 MG tablet TAKE (1) TABLET BY MOUTH TWICE DAILY AT 9AM AND 3PM. SEPARATE BY 6 HOURS FROM TEMAZEPAM. 30 tablet 0  . Multiple Vitamins-Minerals (CEROVITE ADVANCED FORMULA PO) Take 1 tablet by mouth daily. (0800)    . omeprazole (PRILOSEC) 40 MG capsule Take 40 mg by mouth daily.    . ondansetron (ZOFRAN) 4 MG tablet Take 4 mg by mouth every 6 (six) hours as needed for nausea.    . polyethylene glycol (MIRALAX / GLYCOLAX) packet Take 17 g by mouth See admin instructions. Mix 17 grams in 8 ounces of juice/water and drink daily, Monday through Saturday    . sertraline (ZOLOFT) 25 MG tablet Take 1 tablet (25 mg total) by mouth daily. 90  tablet 3  . temazepam (RESTORIL) 15 MG capsule Take 1 capsule (15 mg total) by mouth at bedtime. 30 capsule 5   No current facility-administered medications for this visit.     Objective:  BP 136/72   Pulse (!) 51   Temp (!) 97.2 F (36.2 C) (Temporal)   Ht 5' (1.524 m)   Wt 113 lb 12.8 oz (51.6 kg)   SpO2 96%   BMI 22.23 kg/m  Gen: NAD, resting comfortably CV: bradycardic but regular Lungs: nonlabored breathing at rest Ext: 1+ edema Skin: warm, dry     Assessment and Plan    #Hypertension S: Compliant with  amlodipine 5 mg (gets edema even on non max dose), Lasix 30 mg twice daily for last 2 weeks, hydralazine 25 mg twice a day.  losartan 100 mg- held after surgery  March 2021 and has not been restarted  Feels more dizzy since higher dose lasix- almost all the time with standing and if on her feet BP Readings from Last 3 Encounters:  05/08/20 136/72  04/24/20 (!) 146/78  03/06/20 139/83  A/P: Blood pressure has improved-I think continuing Lasix 30 mg for 2 weeks has been helpful with this-she reports better blood pressures at her facility as well.  For now we will continue amlodipine 5 mg, Lasix 30 mg twice daily for now though I may increase depending on blood work, hydralazine 25 mg twice daily  #Chronic diastolic heart failure S: Compliant with Lasix 30 mg BID for alst 2 weeks (started on 04/24/20 after office visit). We also decreased amlodipine back to 5 mg prior to her coming in - in coordination with nursing home. Pro Bnp only slightly high at 180. Cbc with some anemia but though possibly dilutional as fluid overloaded. Creatinine worsening with lasix that had been increased short term previosuly. CXR with some pulmonary edema  Edema-patient reports no improvement but appears slightly better on exam.  Weight- up 2 lbs despite increase in lasix   Feels short of breath with minimal activity but not worse from last visit. DVT unlikely with ongoing eliquis.  A/P:  Patient with continued edema and weight is actually up slightly-despite that on exam edema looks slightly better and blood pressure improved-I suspect fluid status is slightly improved.  I am worried about her renal function as it had worsened last visit-we will check today before we make any adjustments -I would like to check renal function and then may increase Lasix to 40 mg twice daily  #Atrial flutter S:Patient compliant with Eliquis 2.5 mg twice daily.  Cardioversion 2017 and appeared to stay in normal sinus rhythm for some time-later had to be started on amiodarone-currently on 100 mg  Patient continues to have a tremor-she thinks this started around  the time of starting amiodarone several months ago but certainly has worsened more recently A/P: appears to be in sinus though cannot prove that without EKG.  Appropriate anticoagulate with Eliquis-continue.  She has become bradycardic with her tremor I am worried about ongoing amiodarone-I am going to reach out to cardiology to see if they are okay with [name] stopping this.  I also entered an urgent referral for cardiology follow-up due to bradycardia, last TSH mildly elevated-repeating today, potential poorly controlled CHF  Recommended follow up: Urgent referral to cardiology, recommended follow-up with me 1 to 2 weeks after cardiology visit Future Appointments  Date Time Provider [place]  06/10/2020  1:30 PM [facility]  08/19/2020  1:15 PM Bernarda Caffey, MD [facility] None  09/02/2020 10:40 AM Lelon Perla, MD [facility]  10/21/2020 11:00 AM Nicholas Lose, MD [facility] None    Lab/Order associations:   ICD-10-CM   1. Chronic diastolic HF (heart failure) (HCC)  I50.32 Comprehensive metabolic panel    Ambulatory referral to Cardiology  2. Essential hypertension  I10 TSH  3. Elevated TSH  R79.89 TSH  4. High serum thyroid stimulating hormone (TSH)  R79.89 TSH    Return precautions advised.  Garret Reddish, MD

## 2020-05-08 NOTE — Patient Instructions (Addendum)
Please stop by lab before you go If you have mychart- we will send your results within 3 business days of [name] receiving them.  If you do not have mychart- we will call you about results within 5 business days of [name] receiving them.  *please also note that you will see labs on mychart as soon as they post. I will later go in and write notes on them- will say "notes from Dr. Yong Channel"  I may increase your lasix but want to make sure kidneys are stable  Team please tell scheduler need appointment very soon with cardiology- ordered as urgent for Dr. Stanford Breed- I need help with congestive heart failure management and am concerned she is having amiodarone effects (bradycardia, tremor)  Recommended follow up: I want to see you a week or two after you see cardiology

## 2020-05-09 LAB — COMPREHENSIVE METABOLIC PANEL
ALT: 15 U/L (ref 0–35)
AST: 24 U/L (ref 0–37)
Albumin: 3.8 g/dL (ref 3.5–5.2)
Alkaline Phosphatase: 117 U/L (ref 39–117)
BUN: 49 mg/dL — ABNORMAL HIGH (ref 6–23)
CO2: 30 mEq/L (ref 19–32)
Calcium: 9.1 mg/dL (ref 8.4–10.5)
Chloride: 99 mEq/L (ref 96–112)
Creatinine, Ser: 1.48 mg/dL — ABNORMAL HIGH (ref 0.40–1.20)
GFR: 30.36 mL/min — ABNORMAL LOW (ref >60.00)
Glucose, Bld: 117 mg/dL — ABNORMAL HIGH (ref 70–99)
Potassium: 4.5 mEq/L (ref 3.5–5.1)
Sodium: 137 mEq/L (ref 135–145)
Total Bilirubin: 0.3 mg/dL (ref 0.2–1.2)
Total Protein: 7.6 g/dL (ref 6.0–8.3)

## 2020-05-09 LAB — TSH: TSH: 5.76 u[IU]/mL — ABNORMAL HIGH (ref 0.35–4.50)

## 2020-05-13 ENCOUNTER — Ambulatory Visit (INDEPENDENT_AMBULATORY_CARE_PROVIDER_SITE_OTHER): Payer: Medicare Other | Admitting: Cardiology

## 2020-05-13 ENCOUNTER — Telehealth: Payer: Self-pay | Admitting: *Deleted

## 2020-05-13 ENCOUNTER — Other Ambulatory Visit: Payer: Self-pay

## 2020-05-13 ENCOUNTER — Encounter: Payer: Self-pay | Admitting: Cardiology

## 2020-05-13 VITALS — BP 118/80 | HR 52 | Ht 61.0 in | Wt 114.0 lb

## 2020-05-13 DIAGNOSIS — I10 Essential (primary) hypertension: Secondary | ICD-10-CM | POA: Diagnosis not present

## 2020-05-13 DIAGNOSIS — I480 Paroxysmal atrial fibrillation: Secondary | ICD-10-CM

## 2020-05-13 DIAGNOSIS — R251 Tremor, unspecified: Secondary | ICD-10-CM

## 2020-05-13 DIAGNOSIS — R0602 Shortness of breath: Secondary | ICD-10-CM

## 2020-05-13 DIAGNOSIS — R001 Bradycardia, unspecified: Secondary | ICD-10-CM | POA: Diagnosis not present

## 2020-05-13 DIAGNOSIS — Z7901 Long term (current) use of anticoagulants: Secondary | ICD-10-CM

## 2020-05-13 MED ORDER — AMLODIPINE BESYLATE 2.5 MG PO TABS: 2.5000 mg | ORAL_TABLET | Freq: Every day | ORAL | 3 refills | Status: DC

## 2020-05-13 NOTE — Assessment & Plan Note (Signed)
Controlled.  I cut her Amlodipine back to 2.5 mg daily secondary to LE edema.

## 2020-05-13 NOTE — Patient Instructions (Addendum)
Medication Instructions:  DECREASE YOUR AMLODIPINE TO 2.5 MG DAILY   STOP AMIODARONE   *If you need a refill on your cardiac medications before your next appointment, please call your pharmacy*  Lab Work: NONE   Testing/Procedures: NONE  Follow-Up: At [facility], you and your health needs are our priority.  As part of our continuing mission to provide you with exceptional heart care, we have created designated Provider Care Teams.  These Care Teams include your primary Cardiologist (physician) and Advanced Practice Providers (APPs -  Physician Assistants and Nurse Practitioners) who all work together to provide you with the care you need, when you need it.  We recommend signing up for the patient portal called "MyChart".  Sign up information is provided on this After Visit Summary.  MyChart is used to connect with patients for Virtual Visits (Telemedicine).  Patients are able to view lab/test results, encounter notes, upcoming appointments, etc.  Non-urgent messages can be sent to your provider as well.   To learn more about what you can do with MyChart, go to [email or web address].    Your next appointment:   2-3 week(s)  The format for your next appointment:   In Person  Provider:   You may see Kirk Ruths, MD or one of the following Advanced Practice Providers on your designated Care Team:    Kerin Ransom, PA-C  [place], [place]  Coletta Memos, 

## 2020-05-13 NOTE — Telephone Encounter (Signed)
-----   Message from Lelon Perla, MD sent at 05/11/2020  9:00 AM EST ----- Ok to hold amiodarone; Hilda Blades schedule fu APP ov .me ----- Message ----- From: Marin Olp, MD Sent: 05/08/2020   3:08 PM EST To: Lelon Perla, MD, Evans Lance, MD  Dr. Stanford Breed and Dr. Lovena Le,    This is a 87 year old female with CHF and atrial flutter that Im worried about fluid overload and potential amiodarone side effects.   Patient has diastolic CHF  but in last few weeks after seeming to recover from covid has noted more swelling, shortness of breath- despite increasing lasix 30mg  daily to BID 2 weeks ago (see CXR with pulmonary edema) she has not had much improvement in weight, edema, or shortness of breath. Renal function has worsened some but I was considering trying lasix 40mg  BID after I get labs back today.   I believe patient was started on amiodarone late 2021. Since that time, She complains of a progressive tremor (which I wonder if is related to her amiodarone). She is bradycardic into low 50s as well today. Also TSH was slightly high around 6 last visit (repeating today).    I placed a referral to try to get a follow up with you. Before she comes in though- would you be ok with me stopping or further reducing the amiodarone?   Thanks for your consideration, Garret Reddish PCP

## 2020-05-13 NOTE — Assessment & Plan Note (Signed)
This sounds more pulmonary to me- she has expiratory rhonchi, inspiration is clear.  LE edema has been an issue in the past on Amlodipine 5 mg. Her recent labs show increased BUN/SCr c/w dehydration.  It may be she has Amiodarone pulmonary injury or possibly post COVID pulmonary complications. Her Amiodarone was stopped by Dr Yong Channel but this hasn't been done yet according to the [facility]

## 2020-05-13 NOTE — Assessment & Plan Note (Signed)
Bradycardic in NSR- sensitive to beta blockers

## 2020-05-13 NOTE — Progress Notes (Signed)
Cardiology Office Note:    Date:  05/13/2020   ID:  MIRABELLE [name], DOB 08/17/1924, MRN [id number]  PCP:  Marin Olp, MD  Cardiologist:  Kirk Ruths, MD  Electrophysiologist:  None   Referring MD: Marin Olp, MD   CC: SOB, tremor  History of Present Illness:    Glenda [name] is a frail 87 y.o. female resident of SNF with a hx of hypertension and PAF.  She has had bradycardia in the past and is sensitive to beta-blockers.  She has had issues recently with recurrent atrial fibrillation and was put on amiodarone.  She has been in normal sinus rhythm.  She has had problems with increasing shortness of breath and a tremor since then.  Her diuretics have been increased.  She appears to be maintaining sinus rhythm on exam.  As her diuretics were increased recent lab work show bump in her BUN and creatinine and her diuretics have been cut back some.  Despite this she complains of shortness of breath and edema.  Today in the office her son accompanied her.  He is a former Marine scientist.  He tells me that in the past her the patient has had increasing lower extremity edema on amlodipine 5 mg.  On exam the patient has expiratory rhonchi more consistent with a pulmonary process than congestive heart failure.  O2 sat at rest was 92%, heart rate 54.  Past Medical History:  Diagnosis Date  . Aortic stenosis    Echo 02/2011 showing mild AS with normal LV systolic function  . Arthritis    "lower back" (10/09/2015)  . Atrial flutter with rapid ventricular response ([place]) 08/19/2016   s/p successful DCCV on 08/20/16, continue eliquis  . Chronic bronchitis ([place])    "off and on; several years" (10/09/2015)  . Chronic diastolic CHF (congestive heart failure) ([place])    a. 12/2015 Echo: EF 55-60%, mild AI/MR, mildly dil LA.  Glenda [name] Chronic lower back pain   . Complication of anesthesia    "they had trouble waking me up after colon resection"  . Confusion    Originally listed as TIA-pt denies this hx on  10/09/2015 "it was the [place] I was taking; they had thought I was having a st originally roke"  . DDD (degenerative disc disease), lumbar    severe facet dz and adv DDD MRI L spine 2009  . Diverticulosis   . Dysrhythmia    A-Fib  . Femur fracture, right ([place]) 10/18/2018  . GERD (gastroesophageal reflux disease)   . Hiatal hernia    hx  . Hypercholesterolemia   . Hypertension   . Insomnia   . OA (osteoarthritis) of knee   . Paroxysmal atrial flutter ([place])    a. 09/2015 s/p TEE/DCCV;  b. CHA2DS2VASc = 5-->Eliquis.  . Peripheral edema   . PVCs (premature ventricular contractions)   . Sacral fracture ([place]) 11/06/2012  . Vertigo 11/19/2012    Past Surgical History:  Procedure Laterality Date  . BREAST BIOPSY Left   . CARDIAC CATHETERIZATION  02/17/2001   MILD REGURGITATION. EF 60%  . CARDIOVERSION N/A 10/08/2015   Procedure: CARDIOVERSION;  Surgeon: Sanda Klein, MD;  Location: [facility];  Service: Cardiovascular;  Laterality: N/A;  . CARDIOVERSION N/A 08/20/2016   Procedure: Cardioversion;  Surgeon: Evans Lance, MD;  Location: [place] [facility];  Service: Cardiovascular;  Laterality: N/A;  . CARDIOVERSION N/A 12/24/2019   Procedure: CARDIOVERSION;  Surgeon: Dorothy Spark, MD;  Location: [facility];  Service:  Cardiovascular;  Laterality: N/A;  . CATARACT EXTRACTION W/ INTRAOCULAR LENS  IMPLANT, BILATERAL Bilateral   . CESAREAN SECTION  1954  . COLECTOMY     related to "blockage"  . COLONOSCOPY    . EXCISIONAL HEMORRHOIDECTOMY    . HIP PINNING,CANNULATED Right 10/19/2018   Procedure: Right percutaneous hip pinning;  Surgeon: Erle Crocker, MD;  Location: [facility];  Service: Orthopedics;  Laterality: Right;  . INCISIONAL HERNIA REPAIR N/A 06/07/2019   Procedure: Reduction and Repair of Incisional Hernia with Overlay Mesh;  Surgeon: Kinsinger, Arta Bruce, MD;  Location: [facility];  Service: General;  Laterality: N/A;  . INGUINAL HERNIA REPAIR Bilateral   . IR KYPHO THORACIC  WITH BONE BIOPSY  03/06/2020  . IR RADIOLOGIST EVAL & MGMT  02/07/2020  . IR VERTEBROPLASTY CERV/THOR BX INC UNI/BIL INC/INJECT/IMAGING  03/06/2020  . KYPHOPLASTY Right 09/11/2012   Procedure: Right Acrylic Sacroplasty;  Surgeon: Kristeen Miss, MD;  Location: [place] [facility];  Service: Neurosurgery;  Laterality: Right;  Right  Acrylic Sacroplasty  . TEE WITHOUT CARDIOVERSION N/A 10/08/2015   Procedure: TRANSESOPHAGEAL ECHOCARDIOGRAM (TEE);  Surgeon: Sanda Klein, MD;  Location: [facility];  Service: Cardiovascular;  Laterality: N/A;  . TOTAL HIP ARTHROPLASTY Right 02/05/2019   Procedure: [place] TOTAL HIP ARTHROPLASTY ANTERIOR APPROACH AND REMOVAL OF SCREWS;  Surgeon: Frederik Pear, MD;  Location: [facility];  Service: Orthopedics;  Laterality: Right;  . TOTAL KNEE ARTHROPLASTY Right   . TOTAL MASTECTOMY Left 07/22/2017   Procedure: TOTAL MASTECTOMY;  Surgeon: Excell Seltzer, MD;  Location: [facility];  Service: General;  Laterality: Left;  . UMBILICAL HERNIA REPAIR      Current Medications: Current Meds  Medication Sig  . anastrozole (ARIMIDEX) 1 MG tablet Take 1 tablet (1 mg total) by mouth daily.  Glenda [name] apixaban (ELIQUIS) 2.5 MG TABS tablet Take 1 tablet (2.5 mg total) by mouth 2 (two) times daily. (Patient taking differently: Take 5 mg by mouth 2 (two) times daily.)  . ARTIFICIAL TEARS 0.2-0.2-1 % SOLN Apply to eye.  . Cholecalciferol 25 MCG (1000 UT) tablet Take 1 tablet (1,000 Units total) by mouth daily.  . Cranberry 425 MG CAPS Take 425 mg by mouth 2 (two) times daily.  Glenda [name] docusate sodium (COLACE) 100 MG capsule Take 100 mg by mouth 2 (two) times daily.  . furosemide (LASIX) 20 MG tablet Take 1.5 tablets (30 mg total) by mouth daily.  . hydrALAZINE (APRESOLINE) 25 MG tablet Take 1 tablet (25 mg total) by mouth 2 (two) times daily.  Glenda [name] HYDROcodone-acetaminophen (NORCO/VICODIN) 5-325 MG tablet TAKE (1) TABLET BY MOUTH TWICE DAILY AT 9AM AND 3PM. SEPARATE BY 6 HOURS FROM TEMAZEPAM.  . Multiple  Vitamins-Minerals (CEROVITE ADVANCED FORMULA PO) Take 1 tablet by mouth daily. (0800)  . omeprazole (PRILOSEC) 40 MG capsule Take 40 mg by mouth daily.  . ondansetron (ZOFRAN) 4 MG tablet Take 4 mg by mouth every 6 (six) hours as needed for nausea.  . polyethylene glycol (MIRALAX / GLYCOLAX) packet Take 17 g by mouth See admin instructions. Mix 17 grams in 8 ounces of juice/water and drink daily, Monday through Saturday  . sertraline (ZOLOFT) 25 MG tablet Take 1 tablet (25 mg total) by mouth daily.  . temazepam (RESTORIL) 15 MG capsule Take 1 capsule (15 mg total) by mouth at bedtime.  . [DISCONTINUED] amiodarone (PACERONE) 200 MG tablet Take 100 mg by mouth daily.  . [DISCONTINUED] amLODipine (NORVASC) 5 MG tablet Take 5 mg by mouth daily.  Allergies:   Ace inhibitors, Halcion [triazolam], Pentazocine lactate, Sulfa drugs cross reactors, Trazodone and nefazodone, Amitriptyline, Avelox [moxifloxacin hcl in nacl], and Penicillins   Social History   Socioeconomic History  . Marital status: Widowed    Spouse name: Not on file  . Number of children: Not on file  . Years of education: Not on file  . Highest education level: Not on file  Occupational History  . Not on file  Tobacco Use  . Smoking status: Never Smoker  . Smokeless tobacco: Never Used  Vaping Use  . Vaping Use: Never used  Substance and Sexual Activity  . Alcohol use: No  . Drug use: No  . Sexual activity: Never  Other Topics Concern  . 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 There is no history of Heart attack or Stroke.  ROS:   Please see the history of present illness.     All other systems reviewed and are negative.  EKGs/Labs/Other Studies Reviewed:    The following studies were reviewed today: Echo 05/15/2019- IMPRESSIONS    1. Left ventricular ejection fraction, by estimation, is 60 to 65%. The  left ventricle has normal function. The left ventricle has no regional  wall motion abnormalities. There is mild asymmetric left ventricular  hypertrophy of the septal segment. Left  ventricular diastolic parameters are indeterminate.  2. Right ventricular systolic function is normal. The right ventricular  size is normal. There is normal pulmonary artery systolic pressure.  3. Left atrial size was mildly dilated.  4. The mitral valve is normal in structure and function. Mild mitral  valve regurgitation. No evidence of mitral stenosis.  5. The aortic valve is tricuspid. Aortic valve regurgitation is mild. No  aortic stenosis is present.  6. The inferior vena cava is normal in size with greater than 50%  respiratory variability, suggesting right atrial pressure of 3 mmHg.  7. Side by side comparisons were done with echo 01/2019 showing no  significant changes.   EKG:  EKG is not ordered today.  The ekg ordered 03/03/2020 demonstrates SB HR 49, 1st AVB  Recent Labs: 06/11/2019: Magnesium 2.0 04/24/2020: Hemoglobin 11.8; Platelets 213.0; Pro B Natriuretic peptide (BNP) 180.0 05/08/2020: ALT 15; BUN 49; Creatinine, Ser 1.48; Potassium 4.5; Sodium 137; TSH 5.76  Recent Lipid Panel    Component Value Date/Time   CHOL 182 08/20/2016 0533   TRIG 47 08/20/2016 0533   HDL 73  08/20/2016 0533   CHOLHDL 2.5 08/20/2016 0533   VLDL 9 08/20/2016 0533   LDLCALC 100 (H) 08/20/2016 0533    Physical Exam:    VS:  BP 118/80   Pulse (!) 52  Ht 5\' 1"  (1.549 m)   Wt 114 lb (51.7 kg)   SpO2 92%   BMI 21.54 kg/m     Wt Readings from Last 3 Encounters:  05/13/20 114 lb (51.7 kg)  05/08/20 113 lb 12.8 oz (51.6 kg)  04/24/20 111 lb 6.4 oz (50.5 kg)     GEN: Frail female in a wheel chair in no acute distress HEENT: Normal NECK: No JVD CARDIAC: RRR, no murmurs, rubs, gallops RESPIRATORY:  Rhonchi with expiration, clear with inspriation ABDOMEN: Soft, non-distended MUSCULOSKELETAL:  1-2+ pedal edema; No deformity  SKIN: Warm and dry NEUROLOGIC:  Alert and oriented x 3 PSYCHIATRIC:  Normal affect   ASSESSMENT:    Shortness of breath This sounds more pulmonary to me- she has expiratory rhonchi, inspiration is clear.  LE edema has been an issue in the past on Amlodipine 5 mg. Her recent labs show increased BUN/SCr c/w dehydration.  It may be she has Amiodarone pulmonary injury or possibly post COVID pulmonary complications. Her Amiodarone was stopped by Dr Yong Channel but this hasn't been done yet according to the [facility]  Paroxysmal atrial fibrillation ([place]) NSR on exam- unable to get an EKG secondary to resting tremor  Essential hypertension Controlled.  I cut her Amlodipine back to 2.5 mg daily secondary to LE edema.    Bradycardia Bradycardic in NSR- sensitive to beta blockers  Chronic anticoagulation-Eliquis CHADs VASc=5-on low dose Eliquis  Tremor ? Secondary to Amiodarone- I wrote an order to the SNF to stop this  COVID- Jan 2021  PLAN:    Stop Amiodarone, decrease Amlodipine to 2.5 mg, f/u 2-3 weeks.   Medication Adjustments/Labs and Tests Ordered: Current medicines are reviewed at length with the patient today.  Concerns regarding medicines are outlined above.  No orders of the defined types were placed in this encounter.  Meds ordered this  encounter  Medications  . amLODipine (NORVASC) 2.5 MG tablet    Sig: Take 1 tablet (2.5 mg total) by mouth daily.    Dispense:  90 tablet    Refill:  3    NEW DOSE, D/C 5 MG RX    Patient Instructions  Medication Instructions:  DECREASE YOUR AMLODIPINE TO 2.5 MG DAILY   STOP AMIODARONE   *If you need a refill on your cardiac medications before your next appointment, please call your pharmacy*  Lab Work: NONE   Testing/Procedures: NONE  Follow-Up: At [facility], you and your health needs are our priority.  As part of our continuing mission to provide you with exceptional heart care, we have created designated Provider Care Teams.  These Care Teams include your primary Cardiologist (physician) and Advanced Practice Providers (APPs -  Physician Assistants and Nurse Practitioners) who all work together to provide you with the care you need, when you need it.  We recommend signing up for the patient portal called "MyChart".  Sign up information is provided on this After Visit Summary.  MyChart is used to connect with patients for Virtual Visits (Telemedicine).  Patients are able to view lab/test results, encounter notes, upcoming appointments, etc.  Non-urgent messages can be sent to your provider as well.   To learn more about what you can do with MyChart, go to [email or web address].    Your next appointment:   2-3 week(s)  The format for your next appointment:   In Person  Provider:   You may see Kirk Ruths, MD or one of the following Advanced Practice Providers on your designated Care Team:  Kerin Ransom, PA-C  [place], [place]  Coletta Memos, FNP      Signed, Kerin Ransom, [place]  05/13/2020 12:05 PM    [facility]

## 2020-05-13 NOTE — Assessment & Plan Note (Signed)
?   Secondary to Amiodarone- I wrote an order to the SNF to stop this

## 2020-05-13 NOTE — Telephone Encounter (Signed)
Follow up scheduled 05/13/20

## 2020-05-13 NOTE — Assessment & Plan Note (Signed)
CHADs VASc=5-on low dose Eliquis

## 2020-05-13 NOTE — Assessment & Plan Note (Signed)
NSR on exam- unable to get an EKG secondary to resting tremor

## 2020-05-14 ENCOUNTER — Inpatient Hospital Stay (HOSPITAL_COMMUNITY)
Admission: EM | Admit: 2020-05-14 | Discharge: 2020-05-20 | DRG: 193 | Disposition: E | Payer: Medicare Other | Attending: Family Medicine | Admitting: Family Medicine

## 2020-05-14 ENCOUNTER — Encounter (HOSPITAL_COMMUNITY): Payer: Self-pay | Admitting: Internal Medicine

## 2020-05-14 ENCOUNTER — Emergency Department (HOSPITAL_COMMUNITY): Payer: Medicare Other

## 2020-05-14 ENCOUNTER — Other Ambulatory Visit: Payer: Self-pay

## 2020-05-14 DIAGNOSIS — M159 Polyosteoarthritis, unspecified: Secondary | ICD-10-CM | POA: Diagnosis not present

## 2020-05-14 DIAGNOSIS — E7800 Pure hypercholesterolemia, unspecified: Secondary | ICD-10-CM | POA: Diagnosis present

## 2020-05-14 DIAGNOSIS — F419 Anxiety disorder, unspecified: Secondary | ICD-10-CM | POA: Diagnosis present

## 2020-05-14 DIAGNOSIS — Z8249 Family history of ischemic heart disease and other diseases of the circulatory system: Secondary | ICD-10-CM

## 2020-05-14 DIAGNOSIS — Y95 Nosocomial condition: Secondary | ICD-10-CM | POA: Diagnosis present

## 2020-05-14 DIAGNOSIS — Z853 Personal history of malignant neoplasm of breast: Secondary | ICD-10-CM | POA: Diagnosis not present

## 2020-05-14 DIAGNOSIS — I10 Essential (primary) hypertension: Secondary | ICD-10-CM | POA: Diagnosis not present

## 2020-05-14 DIAGNOSIS — J690 Pneumonitis due to inhalation of food and vomit: Secondary | ICD-10-CM | POA: Diagnosis present

## 2020-05-14 DIAGNOSIS — D649 Anemia, unspecified: Secondary | ICD-10-CM | POA: Diagnosis present

## 2020-05-14 DIAGNOSIS — Z96641 Presence of right artificial hip joint: Secondary | ICD-10-CM | POA: Diagnosis present

## 2020-05-14 DIAGNOSIS — Z79899 Other long term (current) drug therapy: Secondary | ICD-10-CM

## 2020-05-14 DIAGNOSIS — F05 Delirium due to known physiological condition: Secondary | ICD-10-CM | POA: Diagnosis not present

## 2020-05-14 DIAGNOSIS — Z883 Allergy status to other anti-infective agents status: Secondary | ICD-10-CM

## 2020-05-14 DIAGNOSIS — I130 Hypertensive heart and chronic kidney disease with heart failure and stage 1 through stage 4 chronic kidney disease, or unspecified chronic kidney disease: Secondary | ICD-10-CM | POA: Diagnosis present

## 2020-05-14 DIAGNOSIS — Z9012 Acquired absence of left breast and nipple: Secondary | ICD-10-CM

## 2020-05-14 DIAGNOSIS — G9341 Metabolic encephalopathy: Secondary | ICD-10-CM | POA: Diagnosis not present

## 2020-05-14 DIAGNOSIS — G894 Chronic pain syndrome: Secondary | ICD-10-CM | POA: Diagnosis present

## 2020-05-14 DIAGNOSIS — I350 Nonrheumatic aortic (valve) stenosis: Secondary | ICD-10-CM | POA: Diagnosis present

## 2020-05-14 DIAGNOSIS — Z7901 Long term (current) use of anticoagulants: Secondary | ICD-10-CM

## 2020-05-14 DIAGNOSIS — I480 Paroxysmal atrial fibrillation: Secondary | ICD-10-CM | POA: Diagnosis present

## 2020-05-14 DIAGNOSIS — E038 Other specified hypothyroidism: Secondary | ICD-10-CM | POA: Diagnosis present

## 2020-05-14 DIAGNOSIS — J209 Acute bronchitis, unspecified: Secondary | ICD-10-CM | POA: Diagnosis present

## 2020-05-14 DIAGNOSIS — N182 Chronic kidney disease, stage 2 (mild): Secondary | ICD-10-CM | POA: Diagnosis present

## 2020-05-14 DIAGNOSIS — I5032 Chronic diastolic (congestive) heart failure: Secondary | ICD-10-CM | POA: Diagnosis present

## 2020-05-14 DIAGNOSIS — J189 Pneumonia, unspecified organism: Secondary | ICD-10-CM | POA: Diagnosis present

## 2020-05-14 DIAGNOSIS — Z8616 Personal history of COVID-19: Secondary | ICD-10-CM

## 2020-05-14 DIAGNOSIS — R0602 Shortness of breath: Secondary | ICD-10-CM

## 2020-05-14 DIAGNOSIS — Z79811 Long term (current) use of aromatase inhibitors: Secondary | ICD-10-CM

## 2020-05-14 DIAGNOSIS — Z7189 Other specified counseling: Secondary | ICD-10-CM | POA: Diagnosis not present

## 2020-05-14 DIAGNOSIS — Z882 Allergy status to sulfonamides status: Secondary | ICD-10-CM

## 2020-05-14 DIAGNOSIS — Z888 Allergy status to other drugs, medicaments and biological substances status: Secondary | ICD-10-CM

## 2020-05-14 DIAGNOSIS — Z515 Encounter for palliative care: Secondary | ICD-10-CM | POA: Diagnosis not present

## 2020-05-14 DIAGNOSIS — Z66 Do not resuscitate: Secondary | ICD-10-CM | POA: Diagnosis present

## 2020-05-14 DIAGNOSIS — N289 Disorder of kidney and ureter, unspecified: Secondary | ICD-10-CM | POA: Diagnosis present

## 2020-05-14 DIAGNOSIS — Z880 Allergy status to penicillin: Secondary | ICD-10-CM | POA: Diagnosis not present

## 2020-05-14 DIAGNOSIS — Z20822 Contact with and (suspected) exposure to covid-19: Secondary | ICD-10-CM | POA: Diagnosis present

## 2020-05-14 DIAGNOSIS — K219 Gastro-esophageal reflux disease without esophagitis: Secondary | ICD-10-CM | POA: Diagnosis present

## 2020-05-14 DIAGNOSIS — I4892 Unspecified atrial flutter: Secondary | ICD-10-CM | POA: Diagnosis present

## 2020-05-14 DIAGNOSIS — J9601 Acute respiratory failure with hypoxia: Secondary | ICD-10-CM | POA: Diagnosis present

## 2020-05-14 DIAGNOSIS — Z96651 Presence of right artificial knee joint: Secondary | ICD-10-CM | POA: Diagnosis present

## 2020-05-14 DIAGNOSIS — M1990 Unspecified osteoarthritis, unspecified site: Secondary | ICD-10-CM | POA: Diagnosis present

## 2020-05-14 DIAGNOSIS — E860 Dehydration: Secondary | ICD-10-CM | POA: Diagnosis present

## 2020-05-14 DIAGNOSIS — G8929 Other chronic pain: Secondary | ICD-10-CM | POA: Diagnosis present

## 2020-05-14 DIAGNOSIS — Z96652 Presence of left artificial knee joint: Secondary | ICD-10-CM | POA: Diagnosis present

## 2020-05-14 LAB — BASIC METABOLIC PANEL
Anion gap: 12 (ref 5–15)
BUN: 37 mg/dL — ABNORMAL HIGH (ref 8–23)
CO2: 25 mmol/L (ref 22–32)
Calcium: 8.7 mg/dL — ABNORMAL LOW (ref 8.9–10.3)
Chloride: 106 mmol/L (ref 98–111)
Creatinine, Ser: 1.12 mg/dL — ABNORMAL HIGH (ref 0.44–1.00)
GFR, Estimated: 46 mL/min — ABNORMAL LOW (ref >60)
Glucose, Bld: 81 mg/dL (ref 70–99)
Potassium: 4.4 mmol/L (ref 3.5–5.1)
Sodium: 143 mmol/L (ref 135–145)

## 2020-05-14 LAB — LACTIC ACID, PLASMA
Lactic Acid, Venous: 0.6 mmol/L (ref 0.5–1.9)
Lactic Acid, Venous: 0.6 mmol/L (ref 0.5–1.9)

## 2020-05-14 LAB — CBC WITH DIFFERENTIAL/PLATELET
Abs Immature Granulocytes: 0.06 10*3/uL (ref 0.00–0.07)
Basophils Absolute: 0 10*3/uL (ref 0.0–0.1)
Basophils Relative: 0 %
Eosinophils Absolute: 0.1 10*3/uL (ref 0.0–0.5)
Eosinophils Relative: 1 %
HCT: 32.5 % — ABNORMAL LOW (ref 36.0–46.0)
Hemoglobin: 10.1 g/dL — ABNORMAL LOW (ref 12.0–15.0)
Immature Granulocytes: 1 %
Lymphocytes Relative: 8 %
Lymphs Abs: 0.5 10*3/uL — ABNORMAL LOW (ref 0.7–4.0)
MCH: 28.9 pg (ref 26.0–34.0)
MCHC: 31.1 g/dL (ref 30.0–36.0)
MCV: 92.9 fL (ref 80.0–100.0)
Monocytes Absolute: 0.5 10*3/uL (ref 0.1–1.0)
Monocytes Relative: 7 %
Neutro Abs: 5.3 10*3/uL (ref 1.7–7.7)
Neutrophils Relative %: 83 %
Platelets: 217 10*3/uL (ref 150–400)
RBC: 3.5 MIL/uL — ABNORMAL LOW (ref 3.87–5.11)
RDW: 16.5 % — ABNORMAL HIGH (ref 11.5–15.5)
WBC: 6.5 10*3/uL (ref 4.0–10.5)
nRBC: 0 % (ref 0.0–0.2)

## 2020-05-14 LAB — T4, FREE: Free T4: 0.89 ng/dL (ref 0.61–1.12)

## 2020-05-14 LAB — TROPONIN I (HIGH SENSITIVITY)
Troponin I (High Sensitivity): 3 ng/L (ref <18)
Troponin I (High Sensitivity): 3 ng/L (ref <18)

## 2020-05-14 LAB — BRAIN NATRIURETIC PEPTIDE: B Natriuretic Peptide: 290.3 pg/mL — ABNORMAL HIGH (ref 0.0–100.0)

## 2020-05-14 LAB — RESP PANEL BY RT-PCR (FLU A&B, COVID) ARPGX2
Influenza A by PCR: NEGATIVE
Influenza B by PCR: NEGATIVE
SARS Coronavirus 2 by RT PCR: NEGATIVE

## 2020-05-14 LAB — MRSA PCR SCREENING: MRSA by PCR: NEGATIVE

## 2020-05-14 MED ORDER — POLYETHYLENE GLYCOL 3350 17 G PO PACK
17.0000 g | PACK | Freq: Every day | ORAL | Status: DC
  Administered 2020-05-15 – 2020-05-16 (×2): 17 g via ORAL
  Filled 2020-05-14 (×2): qty 1

## 2020-05-14 MED ORDER — SODIUM CHLORIDE 0.9 % IV SOLN
500.0000 mg | Freq: Once | INTRAVENOUS | Status: AC
  Administered 2020-05-14: 500 mg via INTRAVENOUS
  Filled 2020-05-14: qty 500

## 2020-05-14 MED ORDER — LEVALBUTEROL HCL 0.63 MG/3ML IN NEBU
0.6300 mg | INHALATION_SOLUTION | Freq: Two times a day (BID) | RESPIRATORY_TRACT | Status: DC
  Administered 2020-05-15 – 2020-05-16 (×3): 0.63 mg via RESPIRATORY_TRACT
  Filled 2020-05-14 (×5): qty 3

## 2020-05-14 MED ORDER — HYPROMELLOSE (GONIOSCOPIC) 2.5 % OP SOLN
1.0000 [drp] | Freq: Four times a day (QID) | OPHTHALMIC | Status: DC
  Administered 2020-05-14 – 2020-05-19 (×18): 1 [drp] via OPHTHALMIC
  Filled 2020-05-14 (×2): qty 15

## 2020-05-14 MED ORDER — SODIUM CHLORIDE 0.9 % IV SOLN
1.0000 g | Freq: Once | INTRAVENOUS | Status: AC
  Administered 2020-05-14: 1 g via INTRAVENOUS
  Filled 2020-05-14: qty 10

## 2020-05-14 MED ORDER — LEVALBUTEROL HCL 0.63 MG/3ML IN NEBU
0.6300 mg | INHALATION_SOLUTION | RESPIRATORY_TRACT | Status: AC
  Administered 2020-05-14: 0.63 mg via RESPIRATORY_TRACT
  Filled 2020-05-14: qty 3

## 2020-05-14 MED ORDER — LEVALBUTEROL HCL 0.63 MG/3ML IN NEBU
0.6300 mg | INHALATION_SOLUTION | Freq: Four times a day (QID) | RESPIRATORY_TRACT | Status: DC | PRN
  Filled 2020-05-14: qty 3

## 2020-05-14 MED ORDER — HYDRALAZINE HCL 25 MG PO TABS
25.0000 mg | ORAL_TABLET | Freq: Two times a day (BID) | ORAL | Status: DC
  Administered 2020-05-14 – 2020-05-17 (×6): 25 mg via ORAL
  Filled 2020-05-14 (×7): qty 1

## 2020-05-14 MED ORDER — SERTRALINE HCL 25 MG PO TABS
25.0000 mg | ORAL_TABLET | Freq: Every day | ORAL | Status: DC
  Administered 2020-05-15 – 2020-05-17 (×3): 25 mg via ORAL
  Filled 2020-05-14 (×3): qty 1

## 2020-05-14 MED ORDER — ANASTROZOLE 1 MG PO TABS
1.0000 mg | ORAL_TABLET | Freq: Every day | ORAL | Status: DC
  Administered 2020-05-15 – 2020-05-17 (×3): 1 mg via ORAL
  Filled 2020-05-14 (×4): qty 1

## 2020-05-14 MED ORDER — TEMAZEPAM 15 MG PO CAPS
15.0000 mg | ORAL_CAPSULE | Freq: Every day | ORAL | Status: DC
  Administered 2020-05-14 – 2020-05-15 (×2): 15 mg via ORAL
  Filled 2020-05-14 (×2): qty 1

## 2020-05-14 MED ORDER — CEFTRIAXONE SODIUM 2 G IJ SOLR
2.0000 g | INTRAMUSCULAR | Status: DC
  Administered 2020-05-15 – 2020-05-16 (×2): 2 g via INTRAVENOUS
  Filled 2020-05-14 (×2): qty 20

## 2020-05-14 MED ORDER — PANTOPRAZOLE SODIUM 40 MG PO TBEC
40.0000 mg | DELAYED_RELEASE_TABLET | Freq: Every day | ORAL | Status: DC
  Administered 2020-05-15 – 2020-05-17 (×3): 40 mg via ORAL
  Filled 2020-05-14 (×3): qty 1

## 2020-05-14 MED ORDER — AZITHROMYCIN 500 MG PO TABS
500.0000 mg | ORAL_TABLET | Freq: Every day | ORAL | Status: DC
  Administered 2020-05-15 – 2020-05-17 (×3): 500 mg via ORAL
  Filled 2020-05-14 (×3): qty 1

## 2020-05-14 MED ORDER — AMLODIPINE BESYLATE 2.5 MG PO TABS
2.5000 mg | ORAL_TABLET | Freq: Every day | ORAL | Status: DC
  Administered 2020-05-15 – 2020-05-17 (×3): 2.5 mg via ORAL
  Filled 2020-05-14 (×3): qty 1

## 2020-05-14 MED ORDER — GUAIFENESIN ER 600 MG PO TB12
600.0000 mg | ORAL_TABLET | Freq: Two times a day (BID) | ORAL | Status: DC
  Administered 2020-05-14 – 2020-05-17 (×7): 600 mg via ORAL
  Filled 2020-05-14 (×8): qty 1

## 2020-05-14 MED ORDER — PREDNISONE 20 MG PO TABS
40.0000 mg | ORAL_TABLET | Freq: Every day | ORAL | Status: DC
  Administered 2020-05-14 – 2020-05-16 (×3): 40 mg via ORAL
  Filled 2020-05-14 (×3): qty 2

## 2020-05-14 MED ORDER — APIXABAN 2.5 MG PO TABS
2.5000 mg | ORAL_TABLET | Freq: Two times a day (BID) | ORAL | Status: DC
  Administered 2020-05-14 – 2020-05-17 (×6): 2.5 mg via ORAL
  Filled 2020-05-14 (×8): qty 1

## 2020-05-14 MED ORDER — HYDROCODONE-ACETAMINOPHEN 5-325 MG PO TABS
1.0000 | ORAL_TABLET | Freq: Two times a day (BID) | ORAL | Status: DC
  Administered 2020-05-14 – 2020-05-17 (×7): 1 via ORAL
  Filled 2020-05-14 (×8): qty 1

## 2020-05-14 NOTE — H&P (Addendum)
History and Physical    KARIANNA GUSMAN YTK:[id number] DOB: [birth year] DOA: 05/15/2020  Referring MD/NP/PA: Isla Pence, MD PCP: Marin Olp, MD  Patient coming from: [facility] via EMS  Chief Complaint: Shortness of breath  I have personally briefly reviewed patient's old medical records in [place]   HPI: Glenda [name] is a 87 y.o. female with medical history significant of hypertension, aortic stenosis, atrial flutter on Eliquis, chronic pain syndrome, breast cancer status post left mastectomy, and COVID-19 infection who presents with complaints of shortness of breath.  History is obtained from the patient with assistance of her son who is present at bedside who brought her hearing aids.  Over the last month patient reports that her breathing has not been well.  She had been diagnosed with COVID-19 on 04/03/2020, but only had mild symptoms at that time and did not require hospitalization or infusion.  She is fully vaccinated including her booster.  Her breathing became acutely worse over the last few days and she reports complaints of intermittent nonproductive cough, wheezing and lower extremity leg swelling.  Associated symptoms include decreased appetite, weight loss of 3 pounds, and increased thirst.  She had been being followed by her PCP and cardiology.  Due to elevated BNP she had been tried on increased dose of Lasix for a few days earlier this month.  Seen by cardiology yesterday due to persistent issues with her breathing. Cardiology suspected symptoms were more likely pulmonary related and decreased her amlodipine down to 2.5 mg daily, but did not check a chest x-ray at that time.  Patient also notes a tremor that had been present for over a month now.  She had been placed on amiodarone at the end of last year due to her history of atrial flutter, but this had been discontinued recently as it was thought to be worsening her respiratory status and tremor.   She lives in a senior living apartment and aides come in to help look after her.  Last night patient reported having subjective cold and hot chills, but was never noted to have a fever when staff checked.  Staff at the facility noted patient have O2 saturation 70% on room air and placed on 2 L of nasal cannula oxygen with O2 sats 95%.  ED Course: Upon admission the emergency department patient was seen to be afebrile, pulse 52-61, and O2 saturations currently maintained on 2 L of nasal cannula oxygen.  Labs significant for globin 10.9, BUN 37, creatinine 1.12, high-sensitivity troponin III.  X-ray significant for right upper lobe opacity concerning for pneumonia.  Patient was started on Rocephin and azithromycin.  TRH called to admit.  Review of Systems  Constitutional: Positive for chills, malaise/fatigue and weight loss.  HENT: Positive for hearing loss.   Eyes: Negative for photophobia and pain.  Respiratory: Positive for shortness of breath and wheezing.   Cardiovascular: Positive for leg swelling. Negative for chest pain.  Gastrointestinal: Negative for abdominal pain, diarrhea and vomiting.  Genitourinary: Negative for dysuria and hematuria.  Musculoskeletal: Positive for joint pain. Negative for falls.  Neurological: Positive for tremors.  Psychiatric/Behavioral: Negative for substance abuse. The patient has insomnia.     Past Medical History:  Diagnosis Date  . Aortic stenosis    Echo 02/2011 showing mild AS with normal LV systolic function  . Arthritis    "lower back" (10/09/2015)  . Atrial flutter with rapid ventricular response ([place]) 08/19/2016   s/p successful DCCV on 08/20/16,  continue eliquis  . Chronic bronchitis ([place])    "off and on; several years" (10/09/2015)  . Chronic diastolic CHF (congestive heart failure) ([place])    a. 12/2015 Echo: EF 55-60%, mild AI/MR, mildly dil LA.  Marland Kitchen Chronic lower back pain   . Complication of anesthesia    "they had trouble waking me up after  colon resection"  . Confusion    Originally listed as TIA-pt denies this hx on 10/09/2015 "it was the [place] I was taking; they had thought I was having a st originally roke"  . DDD (degenerative disc disease), lumbar    severe facet dz and adv DDD MRI L spine 2009  . Diverticulosis   . Dysrhythmia    A-Fib  . Femur fracture, right ([place]) 10/18/2018  . GERD (gastroesophageal reflux disease)   . Hiatal hernia    hx  . Hypercholesterolemia   . Hypertension   . Insomnia   . OA (osteoarthritis) of knee   . Paroxysmal atrial flutter ([place])    a. 09/2015 s/p TEE/DCCV;  b. CHA2DS2VASc = 5-->Eliquis.  . Peripheral edema   . PVCs (premature ventricular contractions)   . Sacral fracture ([place]) 11/06/2012  . Vertigo 11/19/2012    Past Surgical History:  Procedure Laterality Date  . BREAST BIOPSY Left   . CARDIAC CATHETERIZATION  02/17/2001   MILD REGURGITATION. EF 60%  . CARDIOVERSION N/A 10/08/2015   Procedure: CARDIOVERSION;  Surgeon: Sanda Klein, MD;  Location: [facility];  Service: Cardiovascular;  Laterality: N/A;  . CARDIOVERSION N/A 08/20/2016   Procedure: Cardioversion;  Surgeon: Evans Lance, MD;  Location: [facility];  Service: Cardiovascular;  Laterality: N/A;  . CARDIOVERSION N/A 12/24/2019   Procedure: CARDIOVERSION;  Surgeon: Dorothy Spark, MD;  Location: [facility];  Service: Cardiovascular;  Laterality: N/A;  . CATARACT EXTRACTION W/ INTRAOCULAR LENS  IMPLANT, BILATERAL Bilateral   . [place]  . COLECTOMY     related to "blockage"  . COLONOSCOPY    . EXCISIONAL HEMORRHOIDECTOMY    . HIP PINNING,CANNULATED Right 10/19/2018   Procedure: Right percutaneous hip pinning;  Surgeon: Erle Crocker, MD;  Location: [facility];  Service: Orthopedics;  Laterality: Right;  . INCISIONAL HERNIA REPAIR N/A 06/07/2019   Procedure: Reduction and Repair of Incisional Hernia with Overlay Mesh;  Surgeon: Kinsinger, Arta Bruce, MD;  Location: [facility];  Service:  General;  Laterality: N/A;  . INGUINAL HERNIA REPAIR Bilateral   . IR KYPHO THORACIC WITH BONE BIOPSY  03/06/2020  . IR RADIOLOGIST EVAL & MGMT  02/07/2020  . IR VERTEBROPLASTY CERV/THOR BX INC UNI/BIL INC/INJECT/IMAGING  03/06/2020  . KYPHOPLASTY Right 09/11/2012   Procedure: Right Acrylic Sacroplasty;  Surgeon: Kristeen Miss, MD;  Location: [facility];  Service: Neurosurgery;  Laterality: Right;  Right  Acrylic Sacroplasty  . TEE WITHOUT CARDIOVERSION N/A 10/08/2015   Procedure: TRANSESOPHAGEAL ECHOCARDIOGRAM (TEE);  Surgeon: Sanda Klein, MD;  Location: [facility];  Service: Cardiovascular;  Laterality: N/A;  . TOTAL HIP ARTHROPLASTY Right 02/05/2019   Procedure: [place] TOTAL HIP ARTHROPLASTY ANTERIOR APPROACH AND REMOVAL OF SCREWS;  Surgeon: Frederik Pear, MD;  Location: [facility];  Service: Orthopedics;  Laterality: Right;  . TOTAL KNEE ARTHROPLASTY Right   . TOTAL MASTECTOMY Left 07/22/2017   Procedure: TOTAL MASTECTOMY;  Surgeon: Excell Seltzer, MD;  Location: [facility];  Service: General;  Laterality: Left;  . UMBILICAL HERNIA REPAIR       reports that she has never smoked. She has never  used smokeless tobacco. She reports that she does not drink alcohol and does not use drugs.  Allergies  Allergen Reactions  . Ace Inhibitors Other (See Comments)    Cough  . Halcion [Triazolam]     UNSPECIFIED REACTION   . Pentazocine Lactate     UNSPECIFIED REACTION   . Sulfa Drugs Cross Reactors     UNSPECIFIED REACTION   . Trazodone And Nefazodone Other (See Comments)    Per MAR  . Amitriptyline Other (See Comments)    Hyperactivity  . Avelox [Moxifloxacin Hcl In Nacl] Other (See Comments)    dizziness  . Penicillins Rash    Family History  Problem Relation Age of Onset  . Heart disease Mother   . Hypertension Father   . Other Sister        73 in 2019  . Other Sister        26 in 2019  . Heart disease Sister   . Heart attack Neg Hx   . Stroke Neg Hx     Prior to Admission  medications   Medication Sig Start Date End Date Taking? Authorizing Provider  amLODipine (NORVASC) 2.5 MG tablet Take 1 tablet (2.5 mg total) by mouth daily. 05/13/20  Yes Kilroy, Luke K, PA-C  anastrozole (ARIMIDEX) 1 MG tablet Take 1 tablet (1 mg total) by mouth daily. 10/22/19  Yes Nicholas Lose, MD  apixaban (ELIQUIS) 2.5 MG TABS tablet Take 1 tablet (2.5 mg total) by mouth 2 (two) times daily. 07/24/17  Yes Greer Pickerel, MD  ARTIFICIAL TEARS 0.2-0.2-1 % SOLN Apply 1 drop to eye 4 (four) times daily. 05/06/20  Yes [provider]  Cholecalciferol 25 MCG (1000 UT) tablet Take 1 tablet (1,000 Units total) by mouth daily. 10/04/19  Yes Marin Olp, MD  C[place]berry 425 MG CAPS Take 425 mg by mouth 2 (two) times daily.   Yes [provider]  docusate sodium (COLACE) 100 MG capsule Take 100 mg by mouth 2 (two) times daily.   Yes [provider]  hydrALAZINE (APRESOLINE) 25 MG tablet Take 1 tablet (25 mg total) by mouth 2 (two) times daily. 03/28/20 06/26/20 Yes Crenshaw, Denice Bors, MD  HYDROcodone-acetaminophen (NORCO/VICODIN) 5-325 MG tablet TAKE (1) TABLET BY MOUTH TWICE DAILY AT 9AM AND 3PM. SEPARATE BY 6 HOURS FROM TEMAZEPAM. Patient taking differently: Take 1 tablet by mouth 2 (two) times daily. TAKE (1) TABLET BY MOUTH TWICE DAILY AT 9AM AND 3PM. SEPARATE BY 6 HOURS FROM TEMAZEPAM. 04/24/20  Yes Marin Olp, MD  Multiple Vitamins-Minerals (CEROVITE ADVANCED FORMULA PO) Take 1 tablet by mouth daily. (0800)   Yes [provider]  omeprazole (PRILOSEC) 40 MG capsule Take 40 mg by mouth daily. 08/30/17  Yes [provider]  ondansetron (ZOFRAN) 4 MG tablet Take 4 mg by mouth every 6 (six) hours as needed for nausea.   Yes [provider]  polyethylene glycol (MIRALAX / GLYCOLAX) packet Take 17 g by mouth See admin instructions. Mix 17 grams in 8 ounces of juice/water and drink daily, Monday through Saturday   Yes [provider]  sertraline  (ZOLOFT) 25 MG tablet Take 1 tablet (25 mg total) by mouth daily. 06/25/19  Yes Marin Olp, MD  temazepam (RESTORIL) 15 MG capsule Take 1 capsule (15 mg total) by mouth at bedtime. Patient taking differently: Take 15 mg by mouth at bedtime. Should separate from hydrocodone by at least 6 hours. 01/24/20  Yes Marin Olp, MD  furosemide (LASIX)  20 MG tablet Take 1.5 tablets (30 mg total) by mouth daily. Patient not taking: Reported on 05/13/2020 08/26/16   Baldwin Jamaica, PA-C    Physical Exam:  Constitutional: Thin frail elderly female who appears to be tremulous Vitals:   05/12/2020 1146 04/23/2020 1147 04/22/2020 1330 04/27/2020 1336  BP:  (!) 145/56  138/60  Pulse:  61 (!) 56 (!) 56  Resp:  20 20 20   Temp:  (!) 97.3 F (36.3 C)    TempSrc:  Oral    SpO2: 93% 93% 96% 97%   Eyes: PERRL, lids and conjunctivae normal ENMT: Mucous membranes are dry. Posterior pharynx clear of any exudate or lesions.  Hearing aids currently in place. Neck: normal, supple, no masses, no thyromegaly Respiratory:  Mildly tachypneic with rhonchi appreciated in the upper right lung field.  O2 saturations currently maintained on 2 L of nasal cannula oxygen and patient able to talk in full complete sentences. Cardiovascular: Irregular, no murmurs / rubs / gallops.  +1 bilateral extremity edema. 2+ pedal pulses. No carotid bruits.  Abdomen: no tenderness, no masses palpated. No hepatosplenomegaly. Bowel sounds positive.  Musculoskeletal: no clubbing / cyanosis. No joint deformity upper and lower extremities. Good ROM, no contractures. Normal muscle tone.  Skin: Warm and dry with bruising needlesticks. Neurologic: CN 2-12 grossly intact. Sensation intact, DTR normal. Strength 5/5 in all 4.  Psychiatric: Normal judgment and insight. Alert and oriented x 3. Normal mood.     Labs on Admission: I have personally reviewed following labs and imaging studies  CBC: Recent Labs  Lab 04/30/2020 1215  WBC 6.5   NEUTROABS 5.3  HGB 10.1*  HCT 32.5*  MCV 92.9  PLT 016   Basic Metabolic Panel: Recent Labs  Lab 05/08/20 1458 05/18/2020 1215  NA 137 143  K 4.5 4.4  CL 99 106  CO2 30 25  GLUCOSE 117* 81  BUN 49* 37*  CREATININE 1.48* 1.12*  CALCIUM 9.1 8.7*   GFR: Estimated Creatinine Clearance: 23.7 mL/min (A) (by C-G formula based on SCr of 1.12 mg/dL (H)). Liver Function Tests: Recent Labs  Lab 05/08/20 1458  AST 24  ALT 15  ALKPHOS 117  BILITOT 0.3  PROT 7.6  ALBUMIN 3.8   No results for input(s): LIPASE, AMYLASE in the last 168 hours. No results for input(s): AMMONIA in the last 168 hours. Coagulation Profile: No results for input(s): INR, PROTIME in the last 168 hours. Cardiac Enzymes: No results for input(s): CKTOTAL, CKMB, CKMBINDEX, TROPONINI in the last 168 hours. BNP (last 3 results) Recent Labs    04/24/20 1558  PROBNP 180.0*   HbA1C: No results for input(s): HGBA1C in the last 72 hours. CBG: No results for input(s): GLUCAP in the last 168 hours. Lipid Profile: No results for input(s): CHOL, HDL, LDLCALC, TRIG, CHOLHDL, LDLDIRECT in the last 72 hours. Thyroid Function Tests: No results for input(s): TSH, T4TOTAL, FREET4, T3FREE, THYROIDAB in the last 72 hours. Anemia Panel: No results for input(s): VITAMINB12, FOLATE, FERRITIN, TIBC, IRON, RETICCTPCT in the last 72 hours. Urine analysis:    Component Value Date/Time   COLORURINE YELLOW 01/30/2019 [place] 01/30/2019 1443   LABSPEC 1.012 01/30/2019 1443   PHURINE 6.0 01/30/2019 [place] 01/30/2019 [place] 01/16/2018 [place] 01/30/2019 [place] 01/30/2019 1443   BILIRUBINUR Negative 12/19/2017 1349   [place] 01/30/2019 1443   [place] 01/30/2019 1443   UROBILINOGEN  0.2 01/16/2018 1300   NITRITE NEGATIVE 01/30/2019 1443   LEUKOCYTESUR NEGATIVE 01/30/2019 1443   Sepsis Labs: Recent Results (from  the past 240 hour(s))  Resp Panel by RT-PCR (Flu A&B, Covid) Nasopharyngeal Swab     Status: None   Collection Time: 05/15/2020 11:47 AM   Specimen: Nasopharyngeal Swab; Nasopharyngeal(NP) swabs in vial transport medium  Result Value Ref Range Status   SARS Coronavirus 2 by RT PCR NEGATIVE NEGATIVE Final    Comment: (NOTE) SARS-CoV-2 target nucleic acids are NOT DETECTED.  The SARS-CoV-2 RNA is generally detectable in upper respiratory specimens during the acute phase of infection. The lowest concentration of SARS-CoV-2 viral copies this assay can detect is 138 copies/mL. A negative result does not preclude SARS-Cov-2 infection and should not be used as the sole basis for treatment or other patient management decisions. A negative result may occur with  improper specimen collection/handling, submission of specimen other than nasopharyngeal swab, presence of viral mutation(s) within the areas targeted by this assay, and inadequate number of viral copies(<138 copies/mL). A negative result must be combined with clinical observations, patient history, and epidemiological information. The expected result is Negative.  Fact Sheet for Patients:  EntrepreneurPulse.com.au  Fact Sheet for Healthcare Providers:  IncredibleEmployment.be  This test is no t yet approved or cleared by the [place] FDA and  has been authorized for detection and/or diagnosis of SARS-CoV-2 by FDA under an Emergency Use Authorization (EUA). This EUA will remain  in effect (meaning this test can be used) for the duration of the COVID-19 declaration under Section 564(b)(1) of the Act, 21 U.S.C.section 360bbb-3(b)(1), unless the authorization is terminated  or revoked sooner.       Influenza A by PCR NEGATIVE NEGATIVE Final   Influenza B by PCR NEGATIVE NEGATIVE Final    Comment: (NOTE) The Xpert Xpress SARS-CoV-2/FLU/RSV plus assay is intended as an aid in the diagnosis of  influenza from Nasopharyngeal swab specimens and should not be used as a sole basis for treatment. Nasal washings and aspirates are unacceptable for Xpert Xpress SARS-CoV-2/FLU/RSV testing.  Fact Sheet for Patients: EntrepreneurPulse.com.au  Fact Sheet for Healthcare Providers: IncredibleEmployment.be  This test is not yet approved or cleared by the [place] FDA and has been authorized for detection and/or diagnosis of SARS-CoV-2 by FDA under an Emergency Use Authorization (EUA). This EUA will remain in effect (meaning this test can be used) for the duration of the COVID-19 declaration under Section 564(b)(1) of the Act, 21 U.S.C. section 360bbb-3(b)(1), unless the authorization is terminated or revoked.  Performed at [facility], [place] [address] [place], [place] [address]      Radiological Exams on Admission: DG Chest Port 1 View  Result Date: 05/04/2020 CLINICAL DATA:  sob EXAM: PORTABLE CHEST 1 VIEW COMPARISON:  04/24/2020 and prior. FINDINGS: New right upper lung consolidative opacity. No pneumothorax or pleural effusion. Cardiomegaly and tortuous thoracic aorta, similar to prior exam. Sequela of midthoracic kyphoplasty. IMPRESSION: Right upper lung consolidation concerning for infection. Electronically Signed   By: Primitivo Gauze M.D.   On: 05/13/2020 12:28    EKG: Independently reviewed.  Significant background artifact due to artifact.  Assessment/Plan Acute respiratory failure with hypoxia secondary to healthcare associated pneumonia: Patient presents with complaints of worsening shortness of breath.  Chest x-ray noted today right upper lobe pneumonia.  Patient had just recently gotten over COVID-19 on 1/13.  O2 saturation was reported as low as 78% on room air at the facility, with improvement  on 2 L nasal cannula oxygen.  Suspect symptoms more likely to pneumonia with bronchitis possibly being secondary -Admit to a  medical telemetry bed -Continuous pulse oximetry with nasal cannula oxygen maintain O2 saturation greater than 90% -Incentive spirometry and flutter valve -Continue empiric antibiotics of Rocephin and azithromycin -Mucinex  Acute bronchitis: Patient noted to be actively.  For exam.  Any breathing treatments or inhalers at home.  This is likely contributing to patient's shortness of breath -Prednisone 40 mg daily -Levalbuterol nebs twice daily and as needed for shortness of breath/wheeze  Essential hypertension: Blood pressures currently stable.  Home blood pressure medications include hydralazine 25 mg twice daily and amlodipine 2.5 mg daily. -Continue current regimen as tolerated  Atrial flutter/atrial flutter with chronic anticoagulation: Patient with prior history of atrial flutter status post cardioversion back in 2018.  On physical exam rate is a regular heart rates appear to be less than 100. -Continue Eliquis  Diastolic congestive heart failure: Chronic.  Last echocardiogram from 2/23 noted EF to be 60 to 96% with diastolic parameters indeterminate.  Patient otherwise appears to be euvolemic.  -Strict intake and output -Daily weights  Renal insufficiency secondary to dehydration: Patient presents with creatinine 1.12 with BUN 37.  Creatinine had been elevated as high as 1 on 2/17 after patient had been on Lasix.  Patient reports that she has been feeling thirsty and notes that she does not drink enough fluids at baseline. Would suspect secondary to dehydration given elevated BUN to creatinine ratio.  -Will encourage p.o. fluids for now -Recheck function in a.m.  Normocytic anemia: Acute.  On admission hemoglobin 10.8 g/dL.  Hemoglobin previously had been 11.8 on 2/3.  Patient denies any reports of bleeding. -Recheck CBC in a.m.   Recent Covid -19 infection: COVID-19 on 04/03/2020, but only had mild symptoms at that time and did not require hospitalization or infusion. Patient was  fully vaccinated with booster.  History of breast cancer: Patient with history of breast cancer to the left breast status post mastectomy -Continue anastrozole -Continue outpatient follow-up with Dr. Lindi Adie  Subclinical hypothyroidism: Last TSH was 5.76 on 05/08/2020. -Check free T4  Chronic pain secondary to osteoarthritis -Continue current hydrocodone regimen with stool softeners  Anxiety -Continue Zoloft and Restoril  DNR present on admission  DVT prophylaxis: Eliquis Code Status: DNR Family Communication: Son updated at bedside Disposition Plan: Likely discharge back to [facility] once medically stable Consults called: None Admission status: Inpatient, require more than 2 midnight stay due to pneumonia  Norval Morton MD [facility]   If 7PM-7AM, please contact night-coverage   05/05/2020, 2:32 PM

## 2020-05-14 NOTE — ED Triage Notes (Signed)
Pt arrived by medics from [facility], complaining of shortness of breath and low O2 sats.   Per staff at facilty RA was 78%, medics placed her on 2L for a sat of 95%.   Pt states improved work of breath and denies pain at this time

## 2020-05-14 NOTE — ED Notes (Signed)
Report attempted, unsuccessful, will call back.

## 2020-05-14 NOTE — Plan of Care (Signed)
  Problem: Education: Goal: Knowledge of General Education information will improve Description: Including pain rating scale, medication(s)/side effects and non-pharmacologic comfort measures Outcome: Progressing   Problem: Clinical Measurements: Goal: Will remain free from infection Outcome: Progressing Goal: Respiratory complications will improve Outcome: Progressing   Problem: Nutrition: Goal: Adequate nutrition will be maintained Outcome: Progressing   

## 2020-05-14 NOTE — ED Provider Notes (Signed)
[facility] EMERGENCY DEPARTMENT Provider Note   CSN: [id number] Arrival date & time: 05/15/2020  1140     History Chief Complaint  Patient presents with  . Shortness of Breath    Glenda [name] is a 87 y.o. female.  Pt presents to the ED today with sob.  Pt is from [place] and is a poor historian.  Per EMS, staff at [facility] said she had an O2 sat of 78% on RA.  EMS put her on 2L and sat is up to 95%.  Pt said breathing has improved with oxygen.  No hx or fever.  No new pain.  Pt did see cardiology yesterday for increased sob.  It was thought that sob was pulmonary rather than heart related.  He wanted her to decrease amlodipine to 2.5 mg (due to feet swelling) and stop her amiodarone.        Past Medical History:  Diagnosis Date  . Aortic stenosis    Echo 02/2011 showing mild AS with normal LV systolic function  . Arthritis    "lower back" (10/09/2015)  . Atrial flutter with rapid ventricular response ([place]) 08/19/2016   s/p successful DCCV on 08/20/16, continue eliquis  . Chronic bronchitis ([place])    "off and on; several years" (10/09/2015)  . Chronic diastolic CHF (congestive heart failure) ([place])    a. 12/2015 Echo: EF 55-60%, mild AI/MR, mildly dil LA.  Marland Kitchen Chronic lower back pain   . Complication of anesthesia    "they had trouble waking me up after colon resection"  . Confusion    Originally listed as TIA-pt denies this hx on 10/09/2015 "it was the [place] I was taking; they had thought I was having a st originally roke"  . DDD (degenerative disc disease), lumbar    severe facet dz and adv DDD MRI L spine 2009  . Diverticulosis   . Dysrhythmia    A-Fib  . Femur fracture, right ([place]) 10/18/2018  . GERD (gastroesophageal reflux disease)   . Hiatal hernia    hx  . Hypercholesterolemia   . Hypertension   . Insomnia   . OA (osteoarthritis) of knee   . Paroxysmal atrial flutter ([place])    a. 09/2015 s/p TEE/DCCV;  b. CHA2DS2VASc = 5-->Eliquis.  .  Peripheral edema   . PVCs (premature ventricular contractions)   . Sacral fracture ([place]) 11/06/2012  . Vertigo 11/19/2012    Patient Active Problem List   Diagnosis Date Noted  . Tremor 05/13/2020  . Macular degeneration 04/24/2020  . Paroxysmal atrial fibrillation ([place]) 01/21/2020  . Secondary hypercoagulable state ([place]) 12/31/2019  . Osteopenia of neck of left femur 10/04/2019  . Incarcerated ventral hernia 06/06/2019  . DNR (do not resuscitate) 06/06/2019  . Bradycardia 02/20/2019  . Status post total replacement of right hip 02/05/2019  . Osteoarthritis of right hip 02/01/2019  . Anxiety 08/08/2018  . Chronic pain syndrome 02/09/2018  . Chronic narcotic use 08/11/2017  . Malignant neoplasm of upper-outer quadrant of left breast in female, estrogen receptor positive ([place]) 06/27/2017  . History of cardioversion 08/20/2016  . Chronic anticoagulation-Eliquis 02/04/2016  . Atrial flutter ([place])   . Shortness of breath 12/13/2014  . Palpitations 12/13/2014  . Constipation 11/06/2012  . Accelerated/malignant hypertension 09/14/2012  . Chronic diastolic HF (heart failure) ([place]) 07/11/2012  . Osteoarthritis 04/03/2012  . Anemia 06/10/2011  . Essential hypertension 03/25/2011  . Insomnia 09/15/2010    Past Surgical History:  Procedure Laterality Date  . BREAST  BIOPSY Left   . CARDIAC CATHETERIZATION  02/17/2001   MILD REGURGITATION. EF 60%  . CARDIOVERSION N/A 10/08/2015   Procedure: CARDIOVERSION;  Surgeon: Sanda Klein, MD;  Location: [facility];  Service: Cardiovascular;  Laterality: N/A;  . CARDIOVERSION N/A 08/20/2016   Procedure: Cardioversion;  Surgeon: Evans Lance, MD;  Location: [facility];  Service: Cardiovascular;  Laterality: N/A;  . CARDIOVERSION N/A 12/24/2019   Procedure: CARDIOVERSION;  Surgeon: Dorothy Spark, MD;  Location: [facility];  Service: Cardiovascular;  Laterality: N/A;  . CATARACT EXTRACTION W/ INTRAOCULAR LENS  IMPLANT, BILATERAL  Bilateral   . [place]  . COLECTOMY     related to "blockage"  . COLONOSCOPY    . EXCISIONAL HEMORRHOIDECTOMY    . HIP PINNING,CANNULATED Right 10/19/2018   Procedure: Right percutaneous hip pinning;  Surgeon: Erle Crocker, MD;  Location: [facility];  Service: Orthopedics;  Laterality: Right;  . INCISIONAL HERNIA REPAIR N/A 06/07/2019   Procedure: Reduction and Repair of Incisional Hernia with Overlay Mesh;  Surgeon: Kinsinger, Arta Bruce, MD;  Location: [facility];  Service: General;  Laterality: N/A;  . INGUINAL HERNIA REPAIR Bilateral   . IR KYPHO THORACIC WITH BONE BIOPSY  03/06/2020  . IR RADIOLOGIST EVAL & MGMT  02/07/2020  . IR VERTEBROPLASTY CERV/THOR BX INC UNI/BIL INC/INJECT/IMAGING  03/06/2020  . KYPHOPLASTY Right 09/11/2012   Procedure: Right Acrylic Sacroplasty;  Surgeon: Kristeen Miss, MD;  Location: [facility];  Service: Neurosurgery;  Laterality: Right;  Right  Acrylic Sacroplasty  . TEE WITHOUT CARDIOVERSION N/A 10/08/2015   Procedure: TRANSESOPHAGEAL ECHOCARDIOGRAM (TEE);  Surgeon: Sanda Klein, MD;  Location: [facility];  Service: Cardiovascular;  Laterality: N/A;  . TOTAL HIP ARTHROPLASTY Right 02/05/2019   Procedure: [place] TOTAL HIP ARTHROPLASTY ANTERIOR APPROACH AND REMOVAL OF SCREWS;  Surgeon: Frederik Pear, MD;  Location: [facility];  Service: Orthopedics;  Laterality: Right;  . TOTAL KNEE ARTHROPLASTY Right   . TOTAL MASTECTOMY Left 07/22/2017   Procedure: TOTAL MASTECTOMY;  Surgeon: Excell Seltzer, MD;  Location: [facility];  Service: General;  Laterality: Left;  . UMBILICAL HERNIA REPAIR       OB History   No obstetric history on file.     Family History  Problem Relation Age of Onset  . Heart disease Mother   . Hypertension Father   . Other Sister        29 in 2019  . Other Sister        62 in 2019  . Heart disease Sister   . Heart attack Neg Hx   . Stroke Neg Hx     Social History   Tobacco Use  . Smoking status: Never Smoker  .  Smokeless tobacco: Never Used  Vaping Use  . Vaping Use: Never used  Substance Use Topics  . Alcohol use: No  . Drug use: No    Home Medications Prior to Admission medications   Medication Sig Start Date End Date Taking? Authorizing Provider  amLODipine (NORVASC) 2.5 MG tablet Take 1 tablet (2.5 mg total) by mouth daily. 05/13/20  Yes Kilroy, Luke K, PA-C  anastrozole (ARIMIDEX) 1 MG tablet Take 1 tablet (1 mg total) by mouth daily. 10/22/19  Yes Nicholas Lose, MD  apixaban (ELIQUIS) 2.5 MG TABS tablet Take 1 tablet (2.5 mg total) by mouth 2 (two) times daily. 07/24/17  Yes Greer Pickerel, MD  ARTIFICIAL TEARS 0.2-0.2-1 % SOLN Apply 1 drop to eye 4 (four) times daily. 05/06/20  Yes [provider]  Cholecalciferol 25 MCG (1000 UT) tablet Take 1 tablet (1,000 Units total) by mouth daily. 10/04/19  Yes Marin Olp, MD  Cranberry 425 MG CAPS Take 425 mg by mouth 2 (two) times daily.   Yes [provider]  docusate sodium (COLACE) 100 MG capsule Take 100 mg by mouth 2 (two) times daily.   Yes [provider]  hydrALAZINE (APRESOLINE) 25 MG tablet Take 1 tablet (25 mg total) by mouth 2 (two) times daily. 03/28/20 06/26/20 Yes Crenshaw, Denice Bors, MD  HYDROcodone-acetaminophen (NORCO/VICODIN) 5-325 MG tablet TAKE (1) TABLET BY MOUTH TWICE DAILY AT 9AM AND 3PM. SEPARATE BY 6 HOURS FROM TEMAZEPAM. Patient taking differently: Take 1 tablet by mouth 2 (two) times daily. TAKE (1) TABLET BY MOUTH TWICE DAILY AT 9AM AND 3PM. SEPARATE BY 6 HOURS FROM TEMAZEPAM. 04/24/20  Yes Marin Olp, MD  Multiple Vitamins-Minerals (CEROVITE ADVANCED FORMULA PO) Take 1 tablet by mouth daily. (0800)   Yes [provider]  omeprazole (PRILOSEC) 40 MG capsule Take 40 mg by mouth daily. 08/30/17  Yes [provider]  ondansetron (ZOFRAN) 4 MG tablet Take 4 mg by mouth every 6 (six) hours as needed for nausea.   Yes [provider]  polyethylene glycol (MIRALAX / GLYCOLAX)  packet Take 17 g by mouth See admin instructions. Mix 17 grams in 8 ounces of juice/water and drink daily, Monday through Saturday   Yes [provider]  sertraline (ZOLOFT) 25 MG tablet Take 1 tablet (25 mg total) by mouth daily. 06/25/19  Yes Marin Olp, MD  temazepam (RESTORIL) 15 MG capsule Take 1 capsule (15 mg total) by mouth at bedtime. Patient taking differently: Take 15 mg by mouth at bedtime. Should separate from hydrocodone by at least 6 hours. 01/24/20  Yes Marin Olp, MD  furosemide (LASIX) 20 MG tablet Take 1.5 tablets (30 mg total) by mouth daily. Patient not taking: Reported on 04/26/2020 08/26/16   Baldwin Jamaica, PA-C    Allergies    Ace inhibitors, Halcion [triazolam], Pentazocine lactate, Sulfa drugs cross reactors, Trazodone and nefazodone, Amitriptyline, Avelox [moxifloxacin hcl in nacl], and Penicillins  Review of Systems   Review of Systems  Respiratory: Positive for shortness of breath.   All other systems reviewed and are negative.   Physical Exam Updated Vital Signs BP 138/60 (BP Location: Right Arm)   Pulse (!) 56   Temp (!) 97.3 F (36.3 C) (Oral)   Resp 20   SpO2 97%   Physical Exam Vitals and nursing note reviewed.  Constitutional:      General: She is in acute distress.     Appearance: She is well-developed.  HENT:     Head: Normocephalic and atraumatic.     Mouth/Throat:     Mouth: Mucous membranes are moist.     Pharynx: Oropharynx is clear.  Eyes:     Extraocular Movements: Extraocular movements intact.     Pupils: Pupils are equal, round, and reactive to light.  Cardiovascular:     Rate and Rhythm: Normal rate. Rhythm irregular.  Pulmonary:     Effort: Tachypnea present.     Breath sounds: Rhonchi present.  Abdominal:     General: Bowel sounds are normal.     Palpations: Abdomen is soft.  Musculoskeletal:     Cervical back: Normal range of motion and neck supple.     Right lower leg: Edema present.     Left  lower leg: Edema  present.  Skin:    General: Skin is warm.     Capillary Refill: Capillary refill takes less than 2 seconds.  Neurological:     General: No focal deficit present.     Mental Status: She is alert and oriented to person, place, and time.  Psychiatric:        Mood and Affect: Mood normal.        Behavior: Behavior normal.     ED Results / Procedures / Treatments   Labs (all labs ordered are listed, but only abnormal results are displayed) Labs Reviewed  BASIC METABOLIC PANEL - Abnormal; Notable for the following components:      Result Value   BUN 37 (*)    Creatinine, Ser 1.12 (*)    Calcium 8.7 (*)    GFR, Estimated 46 (*)    All other components within normal limits  CBC WITH DIFFERENTIAL/PLATELET - Abnormal; Notable for the following components:   RBC 3.50 (*)    Hemoglobin 10.1 (*)    HCT 32.5 (*)    RDW 16.5 (*)    Lymphs Abs 0.5 (*)    All other components within normal limits  BRAIN NATRIURETIC PEPTIDE - Abnormal; Notable for the following components:   B Natriuretic Peptide 290.3 (*)    All other components within normal limits  RESP PANEL BY RT-PCR (FLU A&B, COVID) ARPGX2  CULTURE, BLOOD (ROUTINE X 2)  CULTURE, BLOOD (ROUTINE X 2)  LACTIC ACID, PLASMA  LACTIC ACID, PLASMA  TROPONIN I (HIGH SENSITIVITY)  TROPONIN I (HIGH SENSITIVITY)    EKG EKG Interpretation  Date/Time:  Wednesday May 14 2020 12:16:52 EST Ventricular Rate:  160 PR Interval:    QRS Duration: 110 QT Interval:  231 QTC Calculation: 323 R Axis:   -76 Text Interpretation: Atrial fibrillation pt is tremulous Confirmed by Isla Pence [phone]) on 04/26/2020 12:23:05 PM   Radiology DG Chest Port 1 View  Result Date: 05/16/2020 CLINICAL DATA:  sob EXAM: PORTABLE CHEST 1 VIEW COMPARISON:  04/24/2020 and prior. FINDINGS: New right upper lung consolidative opacity. No pneumothorax or pleural effusion. Cardiomegaly and tortuous thoracic aorta, similar to prior exam. Sequela  of midthoracic kyphoplasty. IMPRESSION: Right upper lung consolidation concerning for infection. Electronically Signed   By: Primitivo Gauze M.D.   On: 05/04/2020 12:28    Procedures Procedures   Medications Ordered in ED Medications  azithromycin (ZITHROMAX) 500 mg in sodium chloride 0.9 % 250 mL IVPB (500 mg Intravenous New Bag/Given 04/25/2020 1411)  cefTRIAXone (ROCEPHIN) 1 g in sodium chloride 0.9 % 100 mL IVPB (0 g Intravenous Stopped 04/28/2020 1323)    ED Course  I have reviewed the triage vital signs and the nursing notes.  Pertinent labs & imaging results that were available during my care of the patient were reviewed by me and considered in my medical decision making (see chart for details).    MDM Rules/Calculators/A&P                          RA O2 sat 91% here.  She was placed on 2L for comfort.  RR has decreased with oxygen.  With any kind of movement, pt gets very sob.  However, feels ok with sitting still.  Pt does have pneumonia in the RUL.  She is given rocephin and zithromax.    Covid negative.  Pt's son updated.    Pt d/w Dr. Har[place] Forest (triad) for admission.  [name]  was evaluated in Emergency Department on 04/29/2020 for the symptoms described in the history of present illness. She was evaluated in the context of the global COVID-19 pandemic, which necessitated consideration that the patient might be at risk for infection with the SARS-CoV-2 virus that causes COVID-19. Institutional protocols and algorithms that pertain to the evaluation of patients at risk for COVID-19 are in a state of rapid change based on information released by regulatory bodies including the CDC and federal and state organizations. These policies and algorithms were followed during the patient's care in the ED.  CRITICAL CARE Performed by: Isla Pence   Total critical care time: 30 minutes  Critical care time was exclusive of separately billable procedures and treating other  patients.  Critical care was necessary to treat or prevent imminent or life-threatening deterioration.  Critical care was time spent personally by me on the following activities: development of treatment plan with patient and/or surrogate as well as nursing, discussions with consultants, evaluation of patient's response to treatment, examination of patient, obtaining history from patient or surrogate, ordering and performing treatments and interventions, ordering and review of laboratory studies, ordering and review of radiographic studies, pulse oximetry and re-evaluation of patient's condition.  Final Clinical Impression(s) / ED Diagnoses Final diagnoses:  Community acquired pneumonia of right upper lobe of lung  Acute respiratory failure with hypoxia [facility])    Rx / DC Orders ED Discharge Orders    None       Isla Pence, MD 04/24/2020 1435

## 2020-05-15 ENCOUNTER — Telehealth: Payer: Self-pay | Admitting: Cardiology

## 2020-05-15 DIAGNOSIS — J189 Pneumonia, unspecified organism: Secondary | ICD-10-CM | POA: Diagnosis not present

## 2020-05-15 LAB — CBC WITH DIFFERENTIAL/PLATELET
Abs Immature Granulocytes: 0.1 10*3/uL — ABNORMAL HIGH (ref 0.00–0.07)
Basophils Absolute: 0 10*3/uL (ref 0.0–0.1)
Basophils Relative: 0 %
Eosinophils Absolute: 0 10*3/uL (ref 0.0–0.5)
Eosinophils Relative: 0 %
HCT: 32.4 % — ABNORMAL LOW (ref 36.0–46.0)
Hemoglobin: 10.2 g/dL — ABNORMAL LOW (ref 12.0–15.0)
Immature Granulocytes: 2 %
Lymphocytes Relative: 8 %
Lymphs Abs: 0.5 10*3/uL — ABNORMAL LOW (ref 0.7–4.0)
MCH: 28.8 pg (ref 26.0–34.0)
MCHC: 31.5 g/dL (ref 30.0–36.0)
MCV: 91.5 fL (ref 80.0–100.0)
Monocytes Absolute: 0.4 10*3/uL (ref 0.1–1.0)
Monocytes Relative: 6 %
Neutro Abs: 5.3 10*3/uL (ref 1.7–7.7)
Neutrophils Relative %: 84 %
Platelets: 214 10*3/uL (ref 150–400)
RBC: 3.54 MIL/uL — ABNORMAL LOW (ref 3.87–5.11)
RDW: 16.4 % — ABNORMAL HIGH (ref 11.5–15.5)
WBC: 6.4 10*3/uL (ref 4.0–10.5)
nRBC: 0 % (ref 0.0–0.2)

## 2020-05-15 LAB — COMPREHENSIVE METABOLIC PANEL
ALT: 17 U/L (ref 0–44)
AST: 26 U/L (ref 15–41)
Albumin: 3.1 g/dL — ABNORMAL LOW (ref 3.5–5.0)
Alkaline Phosphatase: 99 U/L (ref 38–126)
Anion gap: 13 (ref 5–15)
BUN: 37 mg/dL — ABNORMAL HIGH (ref 8–23)
CO2: 23 mmol/L (ref 22–32)
Calcium: 8.8 mg/dL — ABNORMAL LOW (ref 8.9–10.3)
Chloride: 108 mmol/L (ref 98–111)
Creatinine, Ser: 1.06 mg/dL — ABNORMAL HIGH (ref 0.44–1.00)
GFR, Estimated: 49 mL/min — ABNORMAL LOW (ref >60)
Glucose, Bld: 93 mg/dL (ref 70–99)
Potassium: 4.7 mmol/L (ref 3.5–5.1)
Sodium: 144 mmol/L (ref 135–145)
Total Bilirubin: 0.7 mg/dL (ref 0.3–1.2)
Total Protein: 7.2 g/dL (ref 6.5–8.1)

## 2020-05-15 LAB — STREP PNEUMONIAE URINARY ANTIGEN: Strep Pneumo Urinary Antigen: NEGATIVE

## 2020-05-15 LAB — TSH: TSH: 2.952 u[IU]/mL (ref 0.350–4.500)

## 2020-05-15 LAB — T4, FREE: Free T4: 0.86 ng/dL (ref 0.61–1.12)

## 2020-05-15 NOTE — Progress Notes (Signed)
PT Cancellation Note  Patient Details Name: Glenda [name] MRN: [id number] DOB: 1925-01-31   Cancelled Treatment:    Reason Eval/Treat Not Completed: Other (comment) - pt just starting breakfast with assist from RN and lab present for blood draws. Will follow-up for PT evaluation as schedule permits.  Mabeline Caras, PT, DPT Acute Rehabilitation Services  Pager [phone] Office [place] 05/15/2020, 9:12 AM

## 2020-05-15 NOTE — Telephone Encounter (Signed)
Returned call to pharmacy-advised last OV patient was taking lasix 30 mg daily.    Patient currently in hospital and may change at discharge.

## 2020-05-15 NOTE — Telephone Encounter (Signed)
New Message:     Please call, need to verify the dose of pt's Furosemide.

## 2020-05-15 NOTE — Progress Notes (Signed)
PROGRESS NOTE    [name]  NIO:[id number] DOB: 1924/10/04 DOA: 05/02/2020 PCP: Marin Olp, MD   Chief Complaint  Patient presents with  . Shortness of Breath   Brief Narrative:  Glenda [name] is Glenda [name] 87 y.o. female with medical history significant of hypertension, aortic stenosis, atrial flutter on Eliquis, chronic pain syndrome, breast cancer status post left mastectomy, and COVID-19 infection who presents with complaints of shortness of breath.  History is obtained from the patient with assistance of her son who is present at bedside who brought her hearing aids.  Over the last month patient reports that her breathing has not been well.  She had been diagnosed with COVID-19 on 04/03/2020, but only had mild symptoms at that time and did not require hospitalization or infusion.  She is fully vaccinated including her booster.  Her breathing became acutely worse over the last few days and she reports complaints of intermittent nonproductive cough, wheezing and lower extremity leg swelling.  Associated symptoms include decreased appetite, weight loss of 3 pounds, and increased thirst.  She had been being followed by her PCP and cardiology.  Due to elevated BNP she had been tried on increased dose of Lasix for [name] few days earlier this month.  Seen by cardiology yesterday due to persistent issues with her breathing. Cardiology suspected symptoms were more likely pulmonary related and decreased her amlodipine down to 2.5 mg daily, but did not check Emari Hreha chest x-ray at that time.  Patient also notes [name] tremor that had been present for over [name] month now.  She had been placed on amiodarone at the end of last year due to her history of atrial flutter, but this had been discontinued recently as it was thought to be worsening her respiratory status and tremor.  She [facility] and aides come in to help look after her.  Last night patient reported having subjective cold and hot chills,  but was never noted to have Jahari Billy fever when staff checked.  Staff at the facility noted patient have O2 saturation 70% on room air and placed on 2 L of nasal cannula oxygen with O2 sats 95%.  ED Course: Upon admission the emergency department patient was seen to be afebrile, pulse 52-61, and O2 saturations currently maintained on 2 L of nasal cannula oxygen.  Labs significant for globin 10.9, BUN 37, creatinine 1.12, high-sensitivity troponin III.  X-ray significant for right upper lobe opacity concerning for pneumonia.  Patient was started on Rocephin and azithromycin.  TRH called to admit.  Assessment & Plan:   Principal Problem:   CAP (community acquired pneumonia) Active Problems:   Essential hypertension   Normocytic anemia   Osteoarthritis   Chronic diastolic HF (heart failure) (HCC)   Chronic pain syndrome   Anxiety   DNR (do not resuscitate)   Paroxysmal atrial fibrillation (HCC)   Acute respiratory failure with hypoxia ([name])   Acute respiratory failure with hypoxia secondary to healthcare associated pneumonia  Acute Bronchitis:  Requiring 2 L [place] CXR with RUL consolidation concerning for pneumonia  Continue ceftriaxone/azithromycin Urine strep, urine legionella, sputum cx pending Negative covid and flu testing 2/23 (positive for covid on 1/13) Wean O2 as tolerated Steroids for wheezing as noted below Continue nebs prn  Essential hypertension: Blood pressures currently stable - mildly elevated this AM.  Home blood pressure medications include hydralazine 25 mg twice daily and amlodipine 2.5 mg daily. -Continue current regimen as tolerated  Atrial flutter/atrial  flutter with chronic anticoagulation: Patient with prior history of atrial flutter status post cardioversion back in 2018.  On physical exam rate is Susanna Benge regular heart rates appear to be less than 100. -Continue Eliquis  Diastolic congestive heart failure: Chronic.  Last echocardiogram from 2/23 noted EF to be 60  to 81% with diastolic parameters indeterminate.  Patient otherwise appears to be euvolemic.  -Strict intake and output -Daily weights  Renal insufficiency secondary to dehydration:  Baseline creatinine appears to be ~0.9 Recently with AKI with creatinine as high as 1.48 on 2/17 prior to admission (reportedly had been on lasix at that time) Improved to 1.12 on admission, continue to follow  -Will encourage p.o. fluids for now -pending labs.  Normocytic anemia:  Relatively stable, follow  Recent Covid -19 infection: COVID-19 on 04/03/2020, but only had mild symptoms at that time and did not require hospitalization or infusion. Patient was fully vaccinated with booster.  History of breast cancer: Patient with history of breast cancer to the left breast status post mastectomy -Continue anastrozole -Continue outpatient follow-up with Dr. Lindi Adie  Subclinical hypothyroidism: Last TSH was 5.76 on 05/08/2020. -Check free T4, TSH  Chronic pain secondary to osteoarthritis -Continue current hydrocodone regimen with stool softeners  Anxiety -Continue Zoloft and Restoril   DVT prophylaxis: eliquis Code Status: DNR Family Communication: son at bedside Disposition:   Status is: Inpatient  Remains inpatient appropriate because:Inpatient level of care appropriate due to severity of illness   Dispo: The patient is from: [facility]              Anticipated d/c is to: pending              Anticipated d/c date is: > 3 days              Patient currently is not medically stable to d/c.   Difficult to place patient No       Consultants:   none  Procedures:   none  Antimicrobials:  Anti-infectives (From admission, onward)   Start     Dose/Rate Route Frequency Ordered Stop   05/15/20 1200  cefTRIAXone (ROCEPHIN) 2 g in sodium chloride 0.9 % 100 mL IVPB        2 g 200 mL/hr over 30 Minutes Intravenous Every 24 hours 05/15/2020 1452 Jun 16, 2020 1159   05/15/20 1200   azithromycin (ZITHROMAX) tablet 500 mg        500 mg Oral Daily 05/09/2020 1452 05/20/20 0959   04/30/2020 1245  cefTRIAXone (ROCEPHIN) 1 g in sodium chloride 0.9 % 100 mL IVPB        1 g 200 mL/hr over 30 Minutes Intravenous  Once 05/04/2020 1233 05/10/2020 1323   05/16/2020 1245  azithromycin (ZITHROMAX) 500 mg in sodium chloride 0.9 % 250 mL IVPB        500 mg 250 mL/hr over 60 Minutes Intravenous  Once 04/26/2020 1233 05/04/2020 1527         Subjective: Feels about same Doesn't feel worse No better, though Son notes she's talking in fuller sentences  Objective: Vitals:   04/22/2020 1700 05/13/2020 1742 05/11/2020 2005 05/15/20 0420  BP: 110/67 128/68 (!) 153/83 (!) 153/73  Pulse: (!) 52 (!) 58 60 66  Resp: 14  19 18   Temp:  98.2 F (36.8 C) 98.1 F (36.7 C) (!) 97.5 F (36.4 C)  TempSrc:  Oral Oral Oral  SpO2: 91% 93% 97% 93%  Weight:  51.7 kg    Height:  5\' 1"  (1.549 m)      Intake/Output Summary (Last 24 hours) at 05/15/2020 0956 Last data filed at 05/12/2020 1900 Gross per 24 hour  Intake 340 ml  Output --  Net 340 ml   Filed Weights   04/26/2020 1742  Weight: 51.7 kg    Examination:  General exam: NAD, appears relatively comfortable  Respiratory system: crackles to R upper lung field, scattered wheezing.  Mildly increased WOB, especially after exam (taking deep breaths, coughing) Cardiovascular system: S1 & S2 heard, RRR.  Gastrointestinal system: Abdomen is nondistended, soft and nontender Central nervous system: Alert and oriented. No focal neurological deficits. Extremities: no LEE Skin: No rashes, lesions or ulcers Psychiatry: Judgement and insight appear normal. Mood & affect appropriate.     Data Reviewed: I have personally reviewed following labs and imaging studies  CBC: Recent Labs  Lab 05/04/2020 1215 05/15/20 0859  WBC 6.5 6.4  NEUTROABS 5.3 5.3  HGB 10.1* 10.2*  HCT 32.5* 32.4*  MCV 92.9 91.5  PLT 217 812    Basic Metabolic Panel: Recent Labs   Lab 05/08/20 1458 04/29/2020 1215  NA 137 143  K 4.5 4.4  CL 99 106  CO2 30 25  GLUCOSE 117* 81  BUN 49* 37*  CREATININE 1.48* 1.12*  CALCIUM 9.1 8.7*    GFR: Estimated Creatinine Clearance: 23.7 mL/min (Omesha Bowerman) (by C-G formula based on SCr of 1.12 mg/dL (H)).  Liver Function Tests: Recent Labs  Lab 05/08/20 1458  AST 24  ALT 15  ALKPHOS 117  BILITOT 0.3  PROT 7.6  ALBUMIN 3.8    CBG: No results for input(s): GLUCAP in the last 168 hours.   Recent Results (from the past 240 hour(s))  Resp Panel by RT-PCR (Flu Mercadez Heitman&B, Covid) Nasopharyngeal Swab     Status: None   Collection Time: 04/23/2020 11:47 AM   Specimen: Nasopharyngeal Swab; Nasopharyngeal(NP) swabs in vial transport medium  Result Value Ref Range Status   SARS Coronavirus 2 by RT PCR NEGATIVE NEGATIVE Final    Comment: (NOTE) SARS-CoV-2 target nucleic acids are NOT DETECTED.  The SARS-CoV-2 RNA is generally detectable in upper respiratory specimens during the acute phase of infection. The lowest concentration of SARS-CoV-2 viral copies this assay can detect is 138 copies/mL. Azarah Dacy negative result does not preclude SARS-Cov-2 infection and should not be used as the sole basis for treatment or other patient management decisions. Echo Propp negative result may occur with  improper specimen collection/handling, submission of specimen other than nasopharyngeal swab, presence of viral mutation(s) within the areas targeted by this assay, and inadequate number of viral copies(<138 copies/mL). Lille Karim negative result must be combined with clinical observations, patient history, and epidemiological information. The expected result is Negative.  Fact Sheet for Patients:  EntrepreneurPulse.com.au  Fact Sheet for Healthcare Providers:  IncredibleEmployment.be  This test is no t yet approved or cleared by the [place] FDA and  has been authorized for detection and/or diagnosis of SARS-CoV-2 by FDA  under an Emergency Use Authorization (EUA). This EUA will remain  in effect (meaning this test can be used) for the duration of the COVID-19 declaration under Section 564(b)(1) of the Act, 21 U.S.C.section 360bbb-3(b)(1), unless the authorization is terminated  or revoked sooner.       Influenza Yeilin Zweber by PCR NEGATIVE NEGATIVE Final   Influenza B by PCR NEGATIVE NEGATIVE Final    Comment: (NOTE) The Xpert Xpress SARS-CoV-2/FLU/RSV plus assay is intended as an aid in the diagnosis of influenza from Nasopharyngeal  swab specimens and should not be used as Starlette Thurow sole basis for treatment. Nasal washings and aspirates are unacceptable for Xpert Xpress SARS-CoV-2/FLU/RSV testing.  Fact Sheet for Patients: EntrepreneurPulse.com.au  Fact Sheet for Healthcare Providers: IncredibleEmployment.be  This test is not yet approved or cleared by the [place] FDA and has been authorized for detection and/or diagnosis of SARS-CoV-2 by FDA under an Emergency Use Authorization (EUA). This EUA will remain in effect (meaning this test can be used) for the duration of the COVID-19 declaration under Section 564(b)(1) of the Act, 21 U.S.C. section 360bbb-3(b)(1), unless the authorization is terminated or revoked.  Performed at [facility], [place] [address] [place], [place] [address]   Culture, blood (routine x 2)     Status: None (Preliminary result)   Collection Time: 04/30/2020 12:03 PM   Specimen: BLOOD RIGHT FOREARM  Result Value Ref Range Status   Specimen Description BLOOD RIGHT FOREARM  Final   Special Requests   Final    BOTTLES DRAWN AEROBIC AND ANAEROBIC Blood Culture results may not be optimal due to an excessive volume of blood received in culture bottles   Culture   Final    NO GROWTH < 24 HOURS Performed at [facility], [place] [address] [place], [place] [address]    Report Status PENDING  Incomplete  Culture, blood (routine x 2)     Status:  None (Preliminary result)   Collection Time: 05/12/2020 12:08 PM   Specimen: BLOOD RIGHT WRIST  Result Value Ref Range Status   Specimen Description BLOOD RIGHT WRIST  Final   Special Requests   Final    BOTTLES DRAWN AEROBIC AND ANAEROBIC Blood Culture adequate volume   Culture   Final    NO GROWTH < 24 HOURS Performed at [facility], [place] [address], [place], [place] [address]    Report Status PENDING  Incomplete  MRSA PCR Screening     Status: None   Collection Time: 04/23/2020  5:11 PM   Specimen: Nasal Mucosa; Nasopharyngeal  Result Value Ref Range Status   MRSA by PCR NEGATIVE NEGATIVE Final    Comment:        The GeneXpert MRSA Assay (FDA approved for NASAL specimens only), is one component of [place] [place] comprehensive MRSA colonization surveillance program. It is not intended to diagnose MRSA infection nor to guide or monitor treatment for MRSA infections. Performed at [facility], [place] [address] [place],  [address]          Radiology Studies: DG Chest Port 1 View  Result Date: 05/18/2020 CLINICAL DATA:  sob EXAM: PORTABLE CHEST 1 VIEW COMPARISON:  04/24/2020 and prior. FINDINGS: New right upper lung consolidative opacity. No pneumothorax or pleural effusion. Cardiomegaly and tortuous thoracic aorta, similar to prior exam. Sequela of midthoracic kyphoplasty. IMPRESSION: Right upper lung consolidation concerning for infection. Electronically Signed   By: Primitivo Gauze M.D.   On: 04/23/2020 12:28        Scheduled Meds: . amLODipine  2.5 mg Oral Daily  . anastrozole  1 mg Oral Daily  . apixaban  2.5 mg Oral BID  . azithromycin  500 mg Oral Daily  . guaiFENesin  600 mg Oral BID  . hydrALAZINE  25 mg Oral BID  . HYDROcodone-acetaminophen  1 tablet Oral BID  . hydroxypropyl methylcellulose / hypromellose  1 drop Both Eyes QID  . levalbuterol  0.63 mg Nebulization BID  . pantoprazole  40 mg Oral Daily  .  polyethylene glycol  17 g Oral Daily  .  predniSONE  40 mg Oral Q breakfast  . sertraline  25 mg Oral Daily  . temazepam  15 mg Oral QHS   Continuous Infusions: . cefTRIAXone (ROCEPHIN)  IV       LOS: 1 day    Time spent: over 30 min    Fayrene Helper, MD [facility]   To contact the attending provider between 7A-7P or the covering provider during after hours 7P-7A, please log into the web site [email or web address] and access using universal [place] password for that web site. If you do not have the password, please call the hospital operator.  05/15/2020, 9:56 AM

## 2020-05-15 NOTE — Progress Notes (Signed)
PT Cancellation Note  Patient Details Name: RYANNE MORAND MRN: [id number] DOB: 10-20-1924   Cancelled Treatment:    Reason Eval/Treat Not Completed: Other (comment) - Patient finishing up prolonged session with Occupational Therapy and recently returned to supine. Will follow-up for PT Evaluation tomorrow.  Mabeline Caras, PT, DPT Acute Rehabilitation Services  Pager [phone] Office [place] 05/15/2020, 4:26 PM

## 2020-05-15 NOTE — Progress Notes (Signed)
CSW notes patient resides at [facility]. CSW will follow for therapy recommendations to see if she can return.   Leiam Hopwood LCSW

## 2020-05-15 NOTE — Evaluation (Signed)
Occupational Therapy Evaluation Patient Details Name: Glenda [name] MRN: [id number] DOB: 17-Feb-1925 Today's Date: 05/15/2020    History of Present Illness Pt is a 87 y.o. female dx with COVID-19 on 04/03/20, now admitted from [facility] 05/13/2020 with c/o SOB. Workup for acute hypoxic respiratory failure due to HCAP, acute bronchitis. CXR with RUL consolidation concerning for infection. PMH includes HTN, aortic stenosis, aflutter on Eliquis, chronic pain, breast CA s/p L mastectomy.   Clinical Impression   Pt admitted with above. She demonstrates the below listed deficits and will benefit from continued OT to maximize safety and independence with BADLs.  Pt presents to OT with impairments in cognition, balance, coordination, activity tolerance.  She currently requires min - total A for ADLs and min - mod A for functional mobility.  She lives at ALF and required assist for ADLs, and primarily utilized a w/c for mobility, but does walk some with RW.  Recommend return to ALF with continued assist with ADLs and w/c use as well as HHOT.       Follow Up Recommendations  Home health OT;Other (comment) (with return to ALF)    Equipment Recommendations  3 in 1 bedside commode    Recommendations for Other Services       Precautions / Restrictions Precautions Precautions: Fall      Mobility Bed Mobility Overal bed mobility: Needs Assistance Bed Mobility: Supine to Sit;Sit to Supine     Supine to sit: Mod assist;HOB elevated Sit to supine: Mod assist   General bed mobility comments: assist to move LEs out of the bed and to scoot hips to the EOB and then assist to lift LEs onto the bed    Transfers Overall transfer level: Needs assistance Equipment used: Rolling walker (2 wheeled) Transfers: Sit to/from [facility] Sit to Stand: Mod assist;Min assist Stand pivot transfers: Mod assist;Min assist       General transfer comment: Pt initially required mod A due to  heavy posterior lean, but progressed to min A    Balance Overall balance assessment: Needs assistance Sitting-balance support: Bilateral upper extremity supported;Feet supported Sitting balance-Leahy Scale: Poor Sitting balance - Comments: requires UE support and min A   Standing balance support: Bilateral upper extremity supported Standing balance-Leahy Scale: Poor Standing balance comment: requries  up to mod A and bil. UE support                           ADL either performed or assessed with clinical judgement   ADL Overall ADL's : Needs assistance/impaired Eating/Feeding: Minimal assistance;Bed level Eating/Feeding Details (indicate cue type and reason): Pt observed with self feeding. She demonstrates significant difficulty scooping food with utensil and has frequent spillage.  Attempted use of curved utensil with built up handle, but she required mod A to use it and recall how to position it in her hand for successful use.  She also was provided with a lidded mug to prevent spills Grooming: Wash/dry hands;Wash/dry face;Minimal assistance;Sitting   Upper Body Bathing: Moderate assistance;Sitting   Lower Body Bathing: Maximal assistance;Sit to/from stand   Upper Body Dressing : Moderate assistance;Sitting   Lower Body Dressing: Total assistance;Sit to/from stand   Toilet Transfer: Moderate assistance;+2 for safety/equipment;Stand-pivot;BSC;RW Toilet Transfer Details (indicate cue type and reason): Pt initially with heavy posterior lean, and bil. LEs tremulous Toileting- Clothing Manipulation and Hygiene: Maximal assistance;Sit to/from stand       Functional mobility during ADLs: Moderate assistance;Minimal  assistance (mod A progressing to min A)       Vision Baseline Vision/History: Wears glasses Wears Glasses: At all times Additional Comments: Pt noted to frequently place styrofoam cup on plate and demonstrates difficulty maneuvering utensils.  Spoke with son  who indicates that pt has h/o macular degeration.  ? if pt has difficulty with contrast sensitivity     Perception     Praxis      Pertinent Vitals/Pain Pain Assessment: No/denies pain     Hand Dominance Right   Extremity/Trunk Assessment Upper Extremity Assessment Upper Extremity Assessment: Generalized weakness;RUE deficits/detail;LUE deficits/detail RUE Deficits / Details: arthritic deformity noted.  Pt with tremors bil. that son reports is attributable to [place] which has been discontinued, per his report RUE Coordination: decreased gross motor;decreased fine motor LUE Deficits / Details: arthritic deformity noted.  Pt with tremors bil. that son reports is attributable to [place] which has been discontinued, per his report LUE Coordination: decreased gross motor;decreased fine motor   Lower Extremity Assessment Lower Extremity Assessment: Defer to PT evaluation   Cervical / Trunk Assessment Cervical / Trunk Assessment: Kyphotic   Communication Communication Communication: HOH (bil. hearing aids)   Cognition Arousal/Alertness: Awake/alert Behavior During Therapy: WFL for tasks assessed/performed Overall Cognitive Status: History of cognitive impairments - at baseline                                     General Comments  Sp02 84% on RA, 93% on 2L with activity    Exercises     Shoulder Instructions      Home Living Family/patient expects to be discharged to:: Assisted living                             Home Equipment: Wheelchair - [employer] - 2 wheels;Walker - 4 wheels   Additional Comments: Pt resides at [facility]      Prior Functioning/Environment Level of Independence: Needs assistance  Gait / Transfers Assistance Needed: Pt pt and son, she has had several falls.  She mostly uses a w/c when OOB, but she reports that when she is in her room, she furniture walks ADL's / [place] Needed: Pt is  inconsistent with her responses.  She initially inicated that staff assists her with dressing, then later she says she lays her clothes out at night so she's not rushed and she gets herself dressed.   Comments: Pt's son reports that pt is supposed to call for assist when she is up, but at times, fails to do so        OT Problem List: Decreased strength;Decreased activity tolerance;Impaired balance (sitting and/or standing);Decreased coordination;Decreased cognition;Decreased safety awareness;Decreased knowledge of use of DME or AE;Cardiopulmonary status limiting activity;Impaired UE functional use      OT Treatment/Interventions: Self-care/ADL training;Therapeutic exercise;Neuromuscular education;DME and/or AE instruction;Therapeutic activities;Cognitive remediation/compensation;Patient/family education;Balance training    OT Goals(Current goals can be found in the care plan section) Acute Rehab OT Goals Patient Stated Goal: to be able to walk OT Goal Formulation: With patient/family Time For Goal Achievement: 05/29/20 Potential to Achieve Goals: Good ADL Goals Pt Will Perform Grooming: (P) with min guard assist;standing Pt Will Transfer to Toilet: (P) with min guard assist;ambulating;regular height toilet;bedside commode;grab bars Pt Will Perform Toileting - Clothing Manipulation and hygiene: (P) with min guard assist;sit to/from stand  OT Frequency: Min 2X/week  Barriers to D/C:            Co-evaluation              AM-PAC OT "6 Clicks" Daily Activity     Outcome Measure Help from another person eating meals?: A Little Help from another person taking care of personal grooming?: A Lot Help from another person toileting, which includes using toliet, bedpan, or urinal?: A Lot Help from another person bathing (including washing, rinsing, drying)?: A Lot Help from another person to put on and taking off regular upper body clothing?: A Lot Help from another person to put on and  taking off regular lower body clothing?: Total 6 Click Score: 12   End of Session Equipment Utilized During Treatment: Rolling walker;Oxygen Nurse Communication: Mobility status  Activity Tolerance: Patient tolerated treatment well Patient left: in bed;with call bell/phone within reach;with bed alarm set;with family/visitor present  OT Visit Diagnosis: Unsteadiness on feet (R26.81);Cognitive communication deficit (R41.841)                Time: 1448-1610 OT Time Calculation (min): 82 min Charges:  OT General Charges $OT Visit: 1 Visit OT Evaluation $OT Eval Moderate Complexity: 1 Mod OT Treatments $Self Care/Home Management : 38-52 mins $Therapeutic Activity: 8-22 mins  Nilsa Nutting., OTR/L Acute Rehabilitation Services Pager [phone] Office [place], [place] 05/15/2020, 4:58 PM

## 2020-05-15 NOTE — Progress Notes (Signed)
Sputum specimen cup @ bedside, pt has a NPC, dry cough, not able to produce any sputum at this time.  Instructions given to pt.

## 2020-05-16 ENCOUNTER — Inpatient Hospital Stay (HOSPITAL_COMMUNITY): Payer: Medicare Other

## 2020-05-16 DIAGNOSIS — J189 Pneumonia, unspecified organism: Secondary | ICD-10-CM | POA: Diagnosis not present

## 2020-05-16 LAB — CBC WITH DIFFERENTIAL/PLATELET
Abs Immature Granulocytes: 0.07 10*3/uL (ref 0.00–0.07)
Basophils Absolute: 0 10*3/uL (ref 0.0–0.1)
Basophils Relative: 0 %
Eosinophils Absolute: 0 10*3/uL (ref 0.0–0.5)
Eosinophils Relative: 0 %
HCT: 32.2 % — ABNORMAL LOW (ref 36.0–46.0)
Hemoglobin: 10.1 g/dL — ABNORMAL LOW (ref 12.0–15.0)
Immature Granulocytes: 1 %
Lymphocytes Relative: 7 %
Lymphs Abs: 0.6 10*3/uL — ABNORMAL LOW (ref 0.7–4.0)
MCH: 28.9 pg (ref 26.0–34.0)
MCHC: 31.4 g/dL (ref 30.0–36.0)
MCV: 92.3 fL (ref 80.0–100.0)
Monocytes Absolute: 0.8 10*3/uL (ref 0.1–1.0)
Monocytes Relative: 9 %
Neutro Abs: 7.3 10*3/uL (ref 1.7–7.7)
Neutrophils Relative %: 83 %
Platelets: 215 10*3/uL (ref 150–400)
RBC: 3.49 MIL/uL — ABNORMAL LOW (ref 3.87–5.11)
RDW: 16.7 % — ABNORMAL HIGH (ref 11.5–15.5)
WBC: 8.8 10*3/uL (ref 4.0–10.5)
nRBC: 0 % (ref 0.0–0.2)

## 2020-05-16 LAB — COMPREHENSIVE METABOLIC PANEL
ALT: 16 U/L (ref 0–44)
AST: 24 U/L (ref 15–41)
Albumin: 3.1 g/dL — ABNORMAL LOW (ref 3.5–5.0)
Alkaline Phosphatase: 95 U/L (ref 38–126)
Anion gap: 9 (ref 5–15)
BUN: 37 mg/dL — ABNORMAL HIGH (ref 8–23)
CO2: 26 mmol/L (ref 22–32)
Calcium: 8.8 mg/dL — ABNORMAL LOW (ref 8.9–10.3)
Chloride: 107 mmol/L (ref 98–111)
Creatinine, Ser: 1.25 mg/dL — ABNORMAL HIGH (ref 0.44–1.00)
GFR, Estimated: 40 mL/min — ABNORMAL LOW (ref >60)
Glucose, Bld: 90 mg/dL (ref 70–99)
Potassium: 4.4 mmol/L (ref 3.5–5.1)
Sodium: 142 mmol/L (ref 135–145)
Total Bilirubin: 0.6 mg/dL (ref 0.3–1.2)
Total Protein: 7.2 g/dL (ref 6.5–8.1)

## 2020-05-16 LAB — PHOSPHORUS: Phosphorus: 3.4 mg/dL (ref 2.5–4.6)

## 2020-05-16 LAB — MAGNESIUM: Magnesium: 2.4 mg/dL (ref 1.7–2.4)

## 2020-05-16 LAB — GLUCOSE, CAPILLARY: Glucose-Capillary: 134 mg/dL — ABNORMAL HIGH (ref 70–99)

## 2020-05-16 LAB — LEGIONELLA PNEUMOPHILA SEROGP 1 UR AG: L. pneumophila Serogp 1 Ur Ag: NEGATIVE

## 2020-05-16 MED ORDER — LORAZEPAM 2 MG/ML IJ SOLN: 0.5000 mg | Freq: Once | INTRAMUSCULAR | Status: DC

## 2020-05-16 MED ORDER — LACTATED RINGERS IV SOLN: INTRAVENOUS | Status: AC

## 2020-05-16 MED ORDER — TEMAZEPAM 7.5 MG PO CAPS
7.5000 mg | ORAL_CAPSULE | Freq: Every day | ORAL | Status: DC
  Administered 2020-05-16: 7.5 mg via ORAL
  Filled 2020-05-16 (×2): qty 1

## 2020-05-16 NOTE — Progress Notes (Addendum)
PROGRESS NOTE    [name]  VZD:[id number] DOB: Mar 08, 1925 DOA: 04/24/2020 PCP: Marin Olp, MD   Chief Complaint  Patient presents with  . Shortness of Breath   Brief Narrative:  Glenda [name] is Glenda [name] 87 y.o. female with medical history significant of hypertension, aortic stenosis, atrial flutter on Eliquis, chronic pain syndrome, breast cancer status post left mastectomy, and COVID-19 infection who presents with complaints of shortness of breath.  History is obtained from the patient with assistance of her son who is present at bedside who brought her hearing aids.  Over the last month patient reports that her breathing has not been well.  She had been diagnosed with COVID-19 on 04/03/2020, but only had mild symptoms at that time and did not require hospitalization or infusion.  She is fully vaccinated including her booster.  Her breathing became acutely worse over the last few days and she reports complaints of intermittent nonproductive cough, wheezing and lower extremity leg swelling.  Associated symptoms include decreased appetite, weight loss of 3 pounds, and increased thirst.  She had been being followed by her PCP and cardiology.  Due to elevated BNP she had been tried on increased dose of Lasix for Wilfredo Canterbury few days earlier this month.  Seen by cardiology yesterday due to persistent issues with her breathing. Cardiology suspected symptoms were more likely pulmonary related and decreased her amlodipine down to 2.5 mg daily, but did not check Avaiah Stempel chest x-ray at that time.  Patient also notes Sholonda Jobst tremor that had been present for over Caliph [place] month now.  She had been placed on amiodarone at the end of last year due to her history of atrial flutter, but this had been discontinued recently as it was thought to be worsening her respiratory status and tremor.  She [facility] apartment and aides come in to help look after her.  Last night patient reported having subjective cold and hot chills,  but was never noted to have Rashanda Magloire fever when staff checked.  Staff at the facility noted patient have O2 saturation 70% on room air and placed on 2 L of nasal cannula oxygen with O2 sats 95%.  ED Course: Upon admission the emergency department patient was seen to be afebrile, pulse 52-61, and O2 saturations currently maintained on 2 L of nasal cannula oxygen.  Labs significant for globin 10.9, BUN 37, creatinine 1.12, high-sensitivity troponin III.  X-ray significant for right upper lobe opacity concerning for pneumonia.  Patient was started on Rocephin and azithromycin.  TRH called to admit.  Assessment & Plan:   Principal Problem:   CAP (community acquired pneumonia) Active Problems:   Essential hypertension   Normocytic anemia   Osteoarthritis   Chronic diastolic HF (heart failure) (HCC)   Chronic pain syndrome   Anxiety   DNR (do not resuscitate)   Paroxysmal atrial fibrillation (HCC)   Acute respiratory failure with hypoxia ([place])   Acute respiratory failure with hypoxia secondary to healthcare associated pneumonia  Acute Bronchitis:  Requiring 2 L [place], continued increase WOB CXR with RUL consolidation concerning for pneumonia CXR 2/25 with RUL pneumonia, increased volume loss, new areas of infiltrate in both lower lobes c/w progression of pneumonia   Continue ceftriaxone/azithromycin Urine strep (negative), urine legionella (negative), sputum cx pending Negative covid and flu testing 2/23 (positive for covid on 1/13) Wean O2 as tolerated Wheezing improved, stop steroids especially in setting of delirium Continue nebs prn  Acute Metabolic Encephalopathy Likely hospital delirium -  stop steroids, decrease temazepam dose (this is chronic, needs to be gradually d/c'd) Delirium precautions W/u further as indicated  Essential hypertension: Blood pressures relatively stable.  Home blood pressure medications include hydralazine 25 mg twice daily and amlodipine 2.5 mg  daily. -Continue current regimen as tolerated  Atrial flutter/atrial flutter with chronic anticoagulation: Patient with prior history of atrial flutter status post cardioversion back in 2018.  On physical exam rate is Marik Sedore regular heart rates appear to be less than 100. -Continue Eliquis  Diastolic congestive heart failure: Chronic.  Last echocardiogram from 2/23 noted EF to be 60 to 33% with diastolic parameters indeterminate.  Patient otherwise appears to be euvolemic.  -Strict intake and output -Daily weights  Renal insufficiency secondary to dehydration:  Baseline creatinine appears to be ~0.9 Recently with AKI with creatinine as high as 1.48 on 2/17 prior to admission (reportedly had been on lasix at that time) Rising today, start gentle IVF and follow -Will encourage p.o. fluids for now -pending labs.  Normocytic anemia:  Relatively stable, follow  Recent Covid -19 infection: COVID-19 on 04/03/2020, but only had mild symptoms at that time and did not require hospitalization or infusion. Patient was fully vaccinated with booster.  History of breast cancer: Patient with history of breast cancer to the left breast status post mastectomy -Continue anastrozole -Continue outpatient follow-up with Dr. Lindi Adie  Subclinical hypothyroidism: Last TSH was 5.76 on 05/08/2020. -Check free T4, TSH (wnl)  Chronic pain secondary to osteoarthritis -Continue current hydrocodone regimen with stool softeners  Anxiety -Continue Zoloft and Restoril   DVT prophylaxis: eliquis Code Status: DNR Family Communication: son at bedside Disposition:   Status is: Inpatient  Remains inpatient appropriate because:Inpatient level of care appropriate due to severity of illness   Dispo: The patient is from: [facility]              Anticipated d/c is to: pending              Anticipated d/c date is: > 3 days              Patient currently is not medically stable to d/c.    Difficult to place patient No       Consultants:   none  Procedures:   none  Antimicrobials:  Anti-infectives (From admission, onward)   Start     Dose/Rate Route Frequency Ordered Stop   05/15/20 1200  cefTRIAXone (ROCEPHIN) 2 g in sodium chloride 0.9 % 100 mL IVPB        2 g 200 mL/hr over 30 Minutes Intravenous Every 24 hours 05/05/2020 1452 22-May-2020 1159   05/15/20 1200  azithromycin (ZITHROMAX) tablet 500 mg        500 mg Oral Daily 05/12/2020 1452 05/20/20 0959   05/11/2020 1245  cefTRIAXone (ROCEPHIN) 1 g in sodium chloride 0.9 % 100 mL IVPB        1 g 200 mL/hr over 30 Minutes Intravenous  Once 05/02/2020 1233 05/06/2020 1323   05/13/2020 1245  azithromycin (ZITHROMAX) 500 mg in sodium chloride 0.9 % 250 mL IVPB        500 mg 250 mL/hr over 60 Minutes Intravenous  Once 05/13/2020 1233 04/26/2020 1527         Subjective: No new complaints Confused, pleasantly - knows Lady Gary, takes some discussion to get to being in hospital/cone  Breathing feels about the same Son concerned about hallucinations/confusion overnight  Objective: Vitals:   05/15/20 2154 05/16/20 0515 05/16/20 0930 05/16/20  1516  BP: (!) 159/77 (!) 153/72  (!) 144/76  Pulse: 73 61  (!) 56  Resp: 18 18  13   Temp: 98.5 F (36.9 C) (!) 96.9 F (36.1 C)  99.4 F (37.4 C)  TempSrc: Oral Axillary  Oral  SpO2: 98% 97% 99% 94%  Weight:      Height:        Intake/Output Summary (Last 24 hours) at 05/16/2020 1613 Last data filed at 05/16/2020 1515 Gross per 24 hour  Intake 365 ml  Output --  Net 365 ml   Filed Weights   05/03/2020 1742  Weight: 51.7 kg    Examination:  General: No acute distress. Cardiovascular: Heart sounds show Maydelin Deming regular rate, and rhythm.  Lungs: increased wob, scattered crackles, most right upper lung field Abdomen: Soft, nontender, nondistended Neurological: Alert, but disoriented, pleasant. Moves all extremities 4. Cranial nerves II through XII grossly intact. Skin: Warm and  dry. No rashes or lesions. Extremities: No clubbing or cyanosis. Trace edema.  Data Reviewed: I have personally reviewed following labs and imaging studies  CBC: Recent Labs  Lab 05/18/2020 1215 05/15/20 0859 05/16/20 0204  WBC 6.5 6.4 8.8  NEUTROABS 5.3 5.3 7.3  HGB 10.1* 10.2* 10.1*  HCT 32.5* 32.4* 32.2*  MCV 92.9 91.5 92.3  PLT 217 214 161    Basic Metabolic Panel: Recent Labs  Lab 05/05/2020 1215 05/15/20 0859 05/16/20 0204  NA 143 144 142  K 4.4 4.7 4.4  CL 106 108 107  CO2 25 23 26   GLUCOSE 81 93 90  BUN 37* 37* 37*  CREATININE 1.12* 1.06* 1.25*  CALCIUM 8.7* 8.8* 8.8*  MG  --   --  2.4  PHOS  --   --  3.4    GFR: Estimated Creatinine Clearance: 21.2 mL/min (Timber Marshman) (by C-G formula based on SCr of 1.25 mg/dL (H)).  Liver Function Tests: Recent Labs  Lab 05/15/20 0859 05/16/20 0204  AST 26 24  ALT 17 16  ALKPHOS 99 95  BILITOT 0.7 0.6  PROT 7.2 7.2  ALBUMIN 3.1* 3.1*    CBG: No results for input(s): GLUCAP in the last 168 hours.   Recent Results (from the past 240 hour(s))  Resp Panel by RT-PCR (Flu Ovetta Bazzano&B, Covid) Nasopharyngeal Swab     Status: None   Collection Time: 05/05/2020 11:47 AM   Specimen: Nasopharyngeal Swab; Nasopharyngeal(NP) swabs in vial transport medium  Result Value Ref Range Status   SARS Coronavirus 2 by RT PCR NEGATIVE NEGATIVE Final    Comment: (NOTE) SARS-CoV-2 target nucleic acids are NOT DETECTED.  The SARS-CoV-2 RNA is generally detectable in upper respiratory specimens during the acute phase of infection. The lowest concentration of SARS-CoV-2 viral copies this assay can detect is 138 copies/mL. Ruthy Forry negative result does not preclude SARS-Cov-2 infection and should not be used as the sole basis for treatment or other patient management decisions. Osias Resnick negative result may occur with  improper specimen collection/handling, submission of specimen other than nasopharyngeal swab, presence of viral mutation(s) within the areas targeted  by this assay, and inadequate number of viral copies(<138 copies/mL). Amari Burnsworth negative result must be combined with clinical observations, patient history, and epidemiological information. The expected result is Negative.  Fact Sheet for Patients:  EntrepreneurPulse.com.au  Fact Sheet for Healthcare Providers:  IncredibleEmployment.be  This test is no t yet approved or cleared by the [place] FDA and  has been authorized for detection and/or diagnosis of SARS-CoV-2 by FDA under an Emergency Use Authorization (  EUA). This EUA will remain  in effect (meaning this test can be used) for the duration of the COVID-19 declaration under Section 564(b)(1) of the Act, 21 U.S.C.section 360bbb-3(b)(1), unless the authorization is terminated  or revoked sooner.       Influenza Fredna Stricker by PCR NEGATIVE NEGATIVE Final   Influenza B by PCR NEGATIVE NEGATIVE Final    Comment: (NOTE) The Xpert Xpress SARS-CoV-2/FLU/RSV plus assay is intended as an aid in the diagnosis of influenza from Nasopharyngeal swab specimens and should not be used as Addalyne Vandehei sole basis for treatment. Nasal washings and aspirates are unacceptable for Xpert Xpress SARS-CoV-2/FLU/RSV testing.  Fact Sheet for Patients: EntrepreneurPulse.com.au  Fact Sheet for Healthcare Providers: IncredibleEmployment.be  This test is not yet approved or cleared by the [place] FDA and has been authorized for detection and/or diagnosis of SARS-CoV-2 by FDA under an Emergency Use Authorization (EUA). This EUA will remain in effect (meaning this test can be used) for the duration of the COVID-19 declaration under Section 564(b)(1) of the Act, 21 U.S.C. section 360bbb-3(b)(1), unless the authorization is terminated or revoked.  Performed at [facility], [place] [address] [place], [place] [address]   Culture, blood (routine x 2)     Status: None (Preliminary result)    Collection Time: 05/16/2020 12:03 PM   Specimen: BLOOD RIGHT FOREARM  Result Value Ref Range Status   Specimen Description BLOOD RIGHT FOREARM  Final   Special Requests   Final    BOTTLES DRAWN AEROBIC AND ANAEROBIC Blood Culture results may not be optimal due to an excessive volume of blood received in culture bottles   Culture   Final    NO GROWTH 2 DAYS Performed at [facility], [place] [address] [place], [place] [address]    Report Status PENDING  Incomplete  Culture, blood (routine x 2)     Status: None (Preliminary result)   Collection Time: 05/13/2020 12:08 PM   Specimen: BLOOD RIGHT WRIST  Result Value Ref Range Status   Specimen Description BLOOD RIGHT WRIST  Final   Special Requests   Final    BOTTLES DRAWN AEROBIC AND ANAEROBIC Blood Culture adequate volume   Culture   Final    NO GROWTH 2 DAYS Performed at [facility], [place] [address], [place], [place] [address]    Report Status PENDING  Incomplete  MRSA PCR Screening     Status: None   Collection Time: 04/25/2020  5:11 PM   Specimen: Nasal Mucosa; Nasopharyngeal  Result Value Ref Range Status   MRSA by PCR NEGATIVE NEGATIVE Final    Comment:        The GeneXpert MRSA Assay (FDA approved for NASAL specimens only), is one component of Catharina Pica comprehensive MRSA colonization surveillance program. It is not intended to diagnose MRSA infection nor to guide or monitor treatment for MRSA infections. Performed at [facility], [place] [address] [place], [place] [address]          Radiology Studies: DG CHEST PORT 1 VIEW  Result Date: 05/16/2020 CLINICAL DATA:  Shortness of breath EXAM: PORTABLE CHEST 1 VIEW COMPARISON:  May 14, 2020 FINDINGS: There is persistent airspace opacity consistent with pneumonia in the right upper lobe. There is slightly more volume loss in this area compared to recent study. There is now patchy infiltrate in the right and left lower lobe regions with ill-defined consolidation in  Anacaren Kohan portion of the left lower lobe. Heart is enlarged with  pulmonary vascularity normal. There is aortic atherosclerosis. Status post midthoracic vertebral body kyphoplasty procedures. No adenopathy appreciable by radiography. IMPRESSION: Persistent apparent pneumonia right upper lobe with increase in volume loss in this area compared to recent study. New areas of infiltrate in both lower lobes consistent with progression of pneumonia. Stable cardiomegaly. Aortic Atherosclerosis (ICD10-I70.0). Electronically Signed   By: Lowella Grip III M.D.   On: 05/16/2020 15:57        Scheduled Meds: . amLODipine  2.5 mg Oral Daily  . anastrozole  1 mg Oral Daily  . apixaban  2.5 mg Oral BID  . azithromycin  500 mg Oral Daily  . guaiFENesin  600 mg Oral BID  . hydrALAZINE  25 mg Oral BID  . HYDROcodone-acetaminophen  1 tablet Oral BID  . hydroxypropyl methylcellulose / hypromellose  1 drop Both Eyes QID  . pantoprazole  40 mg Oral Daily  . polyethylene glycol  17 g Oral Daily  . sertraline  25 mg Oral Daily  . temazepam  7.5 mg Oral QHS   Continuous Infusions: . cefTRIAXone (ROCEPHIN)  IV 2 g (05/16/20 1240)     LOS: 2 days    Time spent: over 30 min    Fayrene Helper, MD [facility]   To contact the attending provider between 7A-7P or the covering provider during after hours 7P-7A, please log into the web site [email or web address] and access using universal [place] password for that web site. If you do not have the password, please call the hospital operator.  05/16/2020, 4:13 PM

## 2020-05-16 NOTE — Progress Notes (Signed)
Patient is seeing people in the room that are not there. I redirected  the patient and informed her that it was only me in the room with her. Patient currently takes temazepam at bed time and has been given her meds. Will continue to monitor.

## 2020-05-16 NOTE — TOC Initial Note (Signed)
Transition of Care [facility]) - Initial/Assessment Note    Patient Details  Name: Glenda [name] MRN: [id number] Date of Birth: 1924-08-06  [facility] of [facility]) CM/SW Contact:    Benard Halsted, LCSW Phone Number: 05/16/2020, 9:31 AM  Clinical Narrative:                 CSW spoke with patient's son regarding discharge planning. He is in agreement with patient returning to [facility] with home health therapy. He will be able to transport patient if he has notice to pick up her wheelchair from the ALF prior.  CSW spoke with Joy at [facility]. She reported patient was not on oxygen there, so if she needs it at discharge, TOC will need to order it from [facility] (or [facility]) prior to discharge. [facility] uses their own in-house therapy. At discharge, Fate will fax DC Summary and Fl2 to [phone]. [phone].    Expected Discharge Plan: Assisted Living Barriers to Discharge: Continued Medical Work up   Patient Goals and CMS Choice Patient states their goals for this hospitalization and ongoing recovery are:: Return to ALF CMS Medicare.gov Compare Post Acute Care list provided to:: Patient Represenative (must comment) Choice offered to / list presented to : Adult Children  Expected Discharge Plan and Services Expected Discharge Plan: Assisted Living   Discharge Planning Services: CM Consult Post Acute Care Choice: [place] arrangements for the past 2 months: Assisted Living Facility                           HH Arranged:  (ALF will arrange in house therapy)          Prior Living Arrangements/Services Living arrangements for the past 2 months: [place] Lives with:: Facility Resident Patient language and need for interpreter reviewed:: Yes Do you feel safe going back to the place where you live?: Yes      Need for Family Participation in Patient Care: Yes (Comment) Care giver support system in place?: Yes (comment) Current home services:  DME Criminal Activity/Legal Involvement Pertinent to Current Situation/Hospitalization: No - Comment as needed  Activities of Daily Living Home Assistive Devices/Equipment: Gilford Rile (specify type) ADL Screening (condition at time of admission) Patient's cognitive ability adequate to safely complete daily activities?: Yes Is the patient deaf or have difficulty hearing?: Yes Does the patient have difficulty seeing, even when wearing glasses/contacts?: No Does the patient have difficulty concentrating, remembering, or making decisions?: No Patient able to express need for assistance with ADLs?: Yes Does the patient have difficulty dressing or bathing?: No Independently performs ADLs?: Yes (appropriate for developmental age) Does the patient have difficulty walking or climbing stairs?: Yes Weakness of Legs: Both Weakness of Arms/Hands: None  Permission Sought/Granted Permission sought to share information with : Facility Contact Representative,Family Supports Permission granted to share information with : No  Share Information with NAME: [name]  Permission granted to share info w AGENCY: ALF  Permission granted to share info w Relationship: Son  Permission granted to share info w Contact Information: [phone]  Emotional Assessment Appearance:: Appears stated age Attitude/Demeanor/Rapport: Unable to Assess Affect (typically observed): Unable to Assess Orientation: : Oriented to Self Alcohol / Substance Use: Not Applicable Psych Involvement: No (comment)  Admission diagnosis:  CAP (community acquired pneumonia) [J18.9] Acute respiratory failure with hypoxia ([place]) [J96.01] Community acquired pneumonia of right upper lobe of lung [J18.9] Patient Active Problem List   Diagnosis Date  Noted  . CAP (community acquired pneumonia) 05/01/2020  . Acute respiratory failure with hypoxia ([place]) 05/05/2020  . Tremor 05/13/2020  . Macular degeneration 04/24/2020  . Paroxysmal atrial fibrillation  ([place]) 01/21/2020  . Secondary hypercoagulable state ([place]) 12/31/2019  . Osteopenia of neck of left femur 10/04/2019  . Incarcerated ventral hernia 06/06/2019  . DNR (do not resuscitate) 06/06/2019  . Bradycardia 02/20/2019  . Status post total replacement of right hip 02/05/2019  . Osteoarthritis of right hip 02/01/2019  . Anxiety 08/08/2018  . Chronic pain syndrome 02/09/2018  . Chronic narcotic use 08/11/2017  . Malignant neoplasm of upper-outer quadrant of left breast in female, estrogen receptor positive ([place]) 06/27/2017  . History of cardioversion 08/20/2016  . Chronic anticoagulation-Eliquis 02/04/2016  . Atrial flutter ([place])   . Shortness of breath 12/13/2014  . Palpitations 12/13/2014  . Constipation 11/06/2012  . Accelerated/malignant hypertension 09/14/2012  . Chronic diastolic HF (heart failure) ([place]) 07/11/2012  . Osteoarthritis 04/03/2012  . Normocytic anemia 06/10/2011  . Essential hypertension 03/25/2011  . Insomnia 09/15/2010   PCP:  Marin Olp, MD Pharmacy:   [facility], [place] [address] Phone: [phone] Fax: [phone]     Social Determinants of Health (SDOH) Interventions    Readmission Risk Interventions Readmission Risk Prevention Plan 05/16/2020 02/06/2019  Transportation Screening Complete Complete  PCP or Specialist Appt within 3-5 Days - Not Complete  Not Complete comments - DC date unknown but established with providers  [place] or [place] - Complete  Social Work Consult for [place] Planning/Counseling - Complete  Palliative Care Screening - Not Complete  Palliative Care Screening Not Complete Comments - no desire  Medication Review (Consulting civil engineer) Referral to Pharmacy Referral to Pharmacy  PCP or Specialist appointment within 3-5 days of discharge Complete -  [place] or Home Care Consult Complete -  SW Recovery Care/Counseling Consult Complete -  Palliative Care  Screening Not Applicable -  [place] Not Applicable -  Some recent data might be hidden

## 2020-05-16 NOTE — Evaluation (Signed)
Physical Therapy Evaluation Patient Details Name: Glenda [name] MRN: [id number] DOB: 1924/06/05 Today's Date: 05/16/2020   History of Present Illness  Pt is a 87 y.o. female dx with COVID-19 on 04/03/20, now admitted from [facility] on 05/12/2020 with c/o SOB. Workup for acute hypoxic respiratory failure due to HCAP, acute bronchitis. CXR with RUL consolidation concerning for infection. PMH includes HTN, aortic stenosis, aflutter on Eliquis, chronic pain, breast CA s/p L mastectomy.    Clinical Impression  Pt presents with an overall decrease in functional mobility secondary to above. PTA, pt resides at [facility], typically uses w/c due to fall risk, but ambulates in room with UE support; assist from staff as needed for mobility and ADL tasks. Today, pt able to ambulate with RW and intermittent min-modA for standing and balance; assist for ADL set-up and completion. Son [name]) present and supportive. Pt would benefit from continued acute PT services to maximize functional mobility and independence prior to d/c with HHPT services at her ALF.  SpO2 99% on 2L resting, down to 83% on RA with activity, returning to 95% on 2L O2 end of session     Follow Up Recommendations Home health PT;Supervision for mobility/OOB (return to [facility])    Equipment Recommendations  3in1 (PT)    Recommendations for Other Services       Precautions / Restrictions Precautions Precautions: Fall Restrictions Weight Bearing Restrictions: No      Mobility  Bed Mobility Overal bed mobility: Needs Assistance Bed Mobility: Supine to Sit     Supine to sit: Min assist;HOB elevated     General bed mobility comments: Increased time and effort with cues to stay on task; minA for HHA to scoot hips to EOB, pt managing BLEs well    Transfers Overall transfer level: Needs assistance Equipment used: Rolling walker (2 wheeled) Transfers: Sit to/from Stand Sit to Stand: Mod assist;Min  assist;Min guard         General transfer comment: Initial modA to elevate trunk standing from EOB to RW; performed multiple additional sit<>stands from [facility] in front of sink and recliner - pt pulling up on sink 2x standing requiring minA to elevate trunk; sit<>stand from recliner 2x with min guard, cues for hand placement  Ambulation/Gait Ambulation/Gait assistance: Min assist;Min guard Gait Distance (Feet): 40 Feet Assistive device: Rolling walker (2 wheeled) Gait Pattern/deviations: Step-through pattern;Decreased stride length;Shuffle;Trunk flexed Gait velocity: Decreased   General Gait Details: Slow, mildly unsteady gait with RW and intermittent minA for balance with forward walking, difficulty with turns requiring minA for RW management; shuffling steps with backwards walking requiring assist to manage RW; seated break at sink for ADL tasks and in recliner; DOE 3/4 with SpO2 down to 83% on RA  Stairs            Wheelchair Mobility    Modified Rankin (Stroke Patients Only)       Balance Overall balance assessment: Needs assistance Sitting-balance support: Bilateral upper extremity supported;Feet supported Sitting balance-Leahy Scale: Fair Sitting balance - Comments: Can static sit at sink without UE support to perform ADL tasks (apply makeup, wash face, lotion) - stability improved with UE support   Standing balance support: Bilateral upper extremity supported Standing balance-Leahy Scale: Poor Standing balance comment: Reliant on UE support and intermittent external assist                             Pertinent Vitals/Pain  Pain Assessment: No/denies pain    Home Living Family/patient expects to be discharged to:: Assisted living               Home Equipment: Wheelchair - [employer] - 2 wheels;Walker - 4 wheels Additional Comments: Pt resides at [facility]    Prior Function Level of Independence: Needs assistance   Gait / Transfers  Assistance Needed: Pt pt and son, she has had several falls.  She mostly uses a w/c when OOB, but she reports that when she is in her room, she furniture walks  ADL's / [place] Needed: Pt is inconsistent with her responses.  She initially inicated that staff assists her with dressing, then later she says she lays her clothes out at night so she's not rushed and she gets herself dressed.  Comments: Pt's son reports that pt is supposed to call for assist when she is up (she wears a life alert/call bell around her neck), but at times, fails to do so     Hand Dominance   Dominant Hand: Right    Extremity/Trunk Assessment   Upper Extremity Assessment Upper Extremity Assessment: Generalized weakness;RUE deficits/detail;LUE deficits/detail RUE Deficits / Details: Noted arthritic deformity; tremors improving in BUEs, although still present with fine motor tasks RUE Coordination: decreased gross motor;decreased fine motor LUE Deficits / Details: Noted arthritic deformity; tremors improving in BUEs, although still present with fine motor tasks LUE Coordination: decreased gross motor;decreased fine motor    Lower Extremity Assessment Lower Extremity Assessment: Generalized weakness    Cervical / Trunk Assessment Cervical / Trunk Assessment: Kyphotic  Communication   Communication: HOH (bilateral hearing aides)  Cognition Arousal/Alertness: Awake/alert Behavior During Therapy: WFL for tasks assessed/performed Overall Cognitive Status: History of cognitive impairments - at baseline                                 General Comments: Per RN and pt's son, pt with increased confusion overnight (attempting to get OOB) and this AM. Pt asking what time it is, unable to tell this PT if it's morning or evening. Continues to point to things in hospital room as if it's her room at ALF. Reoriented to date, place, situation. Pt pleasantly confused, following majority of simple  commands appropriately. Poor memory      General Comments General comments (skin integrity, edema, etc.): Son [name]) present at beginning of session. SpO2 99% on 2L resting, down to 83% on RA with activity, returning to 95% on 2L O2 end of session    Exercises Other Exercises Other Exercises: repeated sit<>stand from recliner to RW   Assessment/Plan    PT Assessment Patient needs continued PT services  PT Problem List Decreased strength;Decreased activity tolerance;Decreased balance;Decreased mobility;Decreased cognition;Decreased knowledge of use of DME;Decreased safety awareness;Cardiopulmonary status limiting activity       PT Treatment Interventions DME instruction;Gait training;Functional mobility training;Therapeutic activities;Therapeutic exercise;Balance training;Patient/family education;Cognitive remediation    PT Goals (Current goals can be found in the Care Plan section)  Acute Rehab PT Goals Patient Stated Goal: to be able to walk PT Goal Formulation: With patient/family Time For Goal Achievement: 05/30/20 Potential to Achieve Goals: Good    Frequency Min 3X/week   Barriers to discharge        Co-evaluation               AM-PAC PT "6 Clicks" Mobility  Outcome Measure Help needed turning from your  back to your side while in a flat bed without using bedrails?: A Little Help needed moving from lying on your back to sitting on the side of a flat bed without using bedrails?: A Little Help needed moving to and from a bed to a chair (including a wheelchair)?: A Little Help needed standing up from a chair using your arms (e.g., wheelchair or bedside chair)?: A Lot Help needed to walk in hospital room?: A Little Help needed climbing 3-5 steps with a railing? : A Lot 6 Click Score: 16    End of Session Equipment Utilized During Treatment: Gait belt Activity Tolerance: Patient tolerated treatment well Patient left: in chair;with call bell/phone within reach;with  chair alarm set Nurse Communication: Mobility status PT Visit Diagnosis: Other abnormalities of gait and mobility (R26.89);Muscle weakness (generalized) (M62.81)    Time: 4585-9292 PT Time Calculation (min) (ACUTE ONLY): 29 min   Charges:   PT Evaluation $PT Eval Moderate Complexity: 1 Mod PT Treatments $Therapeutic Activity: 8-22 mins   Mabeline Caras, PT, DPT Acute Rehabilitation Services  Pager [phone] Office [phone]  Derry Lory 05/16/2020, 8:43 AM

## 2020-05-17 ENCOUNTER — Inpatient Hospital Stay (HOSPITAL_COMMUNITY): Payer: Medicare Other

## 2020-05-17 DIAGNOSIS — J189 Pneumonia, unspecified organism: Secondary | ICD-10-CM | POA: Diagnosis not present

## 2020-05-17 LAB — BLOOD GAS, VENOUS
Acid-Base Excess: 2.3 mmol/L — ABNORMAL HIGH (ref 0.0–2.0)
Bicarbonate: 28 mmol/L (ref 20.0–28.0)
FIO2: 28
O2 Saturation: 95.3 %
Patient temperature: 37
pCO2, Ven: 57.9 mmHg (ref 44.0–60.0)
pH, Ven: 7.306 (ref 7.250–7.430)

## 2020-05-17 LAB — CBC WITH DIFFERENTIAL/PLATELET
Abs Immature Granulocytes: 0.06 10*3/uL (ref 0.00–0.07)
Basophils Absolute: 0 10*3/uL (ref 0.0–0.1)
Basophils Relative: 0 %
Eosinophils Absolute: 0 10*3/uL (ref 0.0–0.5)
Eosinophils Relative: 0 %
HCT: 32.7 % — ABNORMAL LOW (ref 36.0–46.0)
Hemoglobin: 10.2 g/dL — ABNORMAL LOW (ref 12.0–15.0)
Immature Granulocytes: 1 %
Lymphocytes Relative: 6 %
Lymphs Abs: 0.3 10*3/uL — ABNORMAL LOW (ref 0.7–4.0)
MCH: 29.1 pg (ref 26.0–34.0)
MCHC: 31.2 g/dL (ref 30.0–36.0)
MCV: 93.4 fL (ref 80.0–100.0)
Monocytes Absolute: 0.3 10*3/uL (ref 0.1–1.0)
Monocytes Relative: 5 %
Neutro Abs: 5.4 10*3/uL (ref 1.7–7.7)
Neutrophils Relative %: 88 %
Platelets: 214 10*3/uL (ref 150–400)
RBC: 3.5 MIL/uL — ABNORMAL LOW (ref 3.87–5.11)
RDW: 16.7 % — ABNORMAL HIGH (ref 11.5–15.5)
WBC: 6.1 10*3/uL (ref 4.0–10.5)
nRBC: 0 % (ref 0.0–0.2)

## 2020-05-17 LAB — COMPREHENSIVE METABOLIC PANEL
ALT: 16 U/L (ref 0–44)
AST: 25 U/L (ref 15–41)
Albumin: 3 g/dL — ABNORMAL LOW (ref 3.5–5.0)
Alkaline Phosphatase: 95 U/L (ref 38–126)
Anion gap: 8 (ref 5–15)
BUN: 34 mg/dL — ABNORMAL HIGH (ref 8–23)
CO2: 26 mmol/L (ref 22–32)
Calcium: 8.8 mg/dL — ABNORMAL LOW (ref 8.9–10.3)
Chloride: 108 mmol/L (ref 98–111)
Creatinine, Ser: 0.95 mg/dL (ref 0.44–1.00)
GFR, Estimated: 56 mL/min — ABNORMAL LOW (ref >60)
Glucose, Bld: 117 mg/dL — ABNORMAL HIGH (ref 70–99)
Potassium: 4.3 mmol/L (ref 3.5–5.1)
Sodium: 142 mmol/L (ref 135–145)
Total Bilirubin: 1.1 mg/dL (ref 0.3–1.2)
Total Protein: 6.9 g/dL (ref 6.5–8.1)

## 2020-05-17 LAB — PHOSPHORUS: Phosphorus: 3.7 mg/dL (ref 2.5–4.6)

## 2020-05-17 LAB — VITAMIN B12: Vitamin B-12: 1063 pg/mL — ABNORMAL HIGH (ref 180–914)

## 2020-05-17 LAB — AMMONIA: Ammonia: 16 umol/L (ref 9–35)

## 2020-05-17 LAB — FOLATE: Folate: 10.9 ng/mL (ref >5.9)

## 2020-05-17 LAB — BRAIN NATRIURETIC PEPTIDE: B Natriuretic Peptide: 304.3 pg/mL — ABNORMAL HIGH (ref 0.0–100.0)

## 2020-05-17 LAB — PROCALCITONIN: Procalcitonin: 0.26 ng/mL

## 2020-05-17 LAB — MAGNESIUM: Magnesium: 2.4 mg/dL (ref 1.7–2.4)

## 2020-05-17 MED ORDER — QUETIAPINE FUMARATE 25 MG PO TABS
25.0000 mg | ORAL_TABLET | Freq: Once | ORAL | Status: AC
  Administered 2020-05-17: 25 mg via ORAL
  Filled 2020-05-17: qty 1

## 2020-05-17 MED ORDER — FUROSEMIDE 10 MG/ML IJ SOLN
20.0000 mg | Freq: Once | INTRAMUSCULAR | Status: AC
  Administered 2020-05-17: 20 mg via INTRAVENOUS
  Filled 2020-05-17: qty 2

## 2020-05-17 MED ORDER — HALOPERIDOL LACTATE 5 MG/ML IJ SOLN
0.5000 mg | Freq: Four times a day (QID) | INTRAMUSCULAR | Status: DC | PRN
  Administered 2020-05-18: 0.5 mg via INTRAVENOUS
  Filled 2020-05-17: qty 1

## 2020-05-17 MED ORDER — SODIUM CHLORIDE 0.9 % IV SOLN
2.0000 g | INTRAVENOUS | Status: DC
  Administered 2020-05-17 – 2020-05-18 (×2): 2 g via INTRAVENOUS
  Filled 2020-05-17 (×2): qty 2

## 2020-05-17 NOTE — Progress Notes (Signed)
Pharmacy Antibiotic Note  Glenda [name] is a 87 y.o. female admitted on 05/18/2020 with pneumonia.  Pharmacy has been consulted for cefepime dosing.  Patient with recent COVID infection admitted with suspected PNA. Initially treated with ceftriaxone and azithromycin. Pharmacy now asked to broaden ceftriaxone to cefepime  Plan: Start cefepime 2g q24h  - f/u LOT, clinical progression - consider stopping azithromycin if patient improves clinically   Height: 5\' 1"  (154.9 cm) Weight: 51.7 kg (113 lb 15.7 oz) IBW/kg (Calculated) : 47.8  Temp (24hrs), Avg:98.5 F (36.9 C), Min:97.5 F (36.4 C), Max:99.4 F (37.4 C)  Recent Labs  Lab 05/04/2020 1203 04/23/2020 1215 04/23/2020 1409 05/15/20 0859 05/16/20 0204 05/17/20 0058  WBC  --  6.5  --  6.4 8.8 6.1  CREATININE  --  1.12*  --  1.06* 1.25* 0.95  LATICACIDVEN 0.6  --  0.6  --   --   --     Estimated Creatinine Clearance: 27.9 mL/min (by C-G formula based on SCr of 0.95 mg/dL).    Allergies  Allergen Reactions  . Ace Inhibitors Other (See Comments)    Cough  . Halcion [Triazolam]     UNSPECIFIED REACTION   . Pentazocine Lactate     UNSPECIFIED REACTION   . Sulfa Drugs Cross Reactors     UNSPECIFIED REACTION   . Trazodone And Nefazodone Other (See Comments)    Per MAR  . Amitriptyline Other (See Comments)    Hyperactivity  . Avelox [Moxifloxacin Hcl In Nacl] Other (See Comments)    dizziness  . Penicillins Rash    Antimicrobials this admission: 2/24 Ceftriaxone >> 2/26 2/24 Azithromycin >> 2/26 Cefepime >>    Microbiology results: 2.23 Bcx: NGTD   Thank you for allowing pharmacy to be a part of this patient's care.  Claudina Lick, PharmD PGY1 Acute Care Pharmacy Resident 05/17/2020 2:03 PM  Please check [email or web address] for unit-specific pharmacy phone numbers.

## 2020-05-17 NOTE — Progress Notes (Signed)
Patient has tried to climb out of bed twice. Patient repositioned and redirected both times. Will continue to monitor.

## 2020-05-17 NOTE — Plan of Care (Signed)
  Problem: Clinical Measurements: Goal: Respiratory complications will improve Outcome: Progressing Goal: Cardiovascular complication will be avoided Outcome: Progressing   

## 2020-05-17 NOTE — Progress Notes (Signed)
PT Cancellation Note  Patient Details Name: Glenda [name] MRN: [id number] DOB: 01/07/1925   Cancelled Treatment:    Reason Eval/Treat Not Completed: Medical issues which prohibited therapy. Pt is very confused today. Son present in room to confirm pt more confused than previous day/night. He is concerned increased confusion may be related to a new medicine she has been given. Son attempting to get pt to eat lunch but pt with little to no interest. Nonsensical speech. Not following commands. No eye contact. Pt not safe or appropriate to attempt mobility at this time. PT to continue to follow.   Lorriane Shire 05/17/2020, 2:46 PM   Lorrin Goodell, PT  Office # [phone] Pager [phone]

## 2020-05-17 NOTE — Progress Notes (Signed)
PROGRESS NOTE    Glenda [name]  VOZ:[id number] DOB: 30-Oct-1924 DOA: 05/11/2020 PCP: Marin Olp, MD   Chief Complaint  Patient presents with  . Shortness of Breath   Brief Narrative:  Glenda [name] is Glenda [name] 87 y.o. female with medical history significant of hypertension, aortic stenosis, atrial flutter on Eliquis, chronic pain syndrome, breast cancer status post left mastectomy, and COVID-19 infection who presents with complaints of shortness of breath.  History is obtained from the patient with assistance of her son who is present at bedside who brought her hearing aids.  Over the last month patient reports that her breathing has not been well.  She had been diagnosed with COVID-19 on 04/03/2020, but only had mild symptoms at that time and did not require hospitalization or infusion.  She is fully vaccinated including her booster.  Her breathing became acutely worse over the last few days and she reports complaints of intermittent nonproductive cough, wheezing and lower extremity leg swelling.  Associated symptoms include decreased appetite, weight loss of 3 pounds, and increased thirst.  She had been being followed by her PCP and cardiology.  Due to elevated BNP she had been tried on increased dose of Lasix for Glenda [name] few days earlier this month.  Seen by cardiology yesterday due to persistent issues with her breathing. Cardiology suspected symptoms were more likely pulmonary related and decreased her amlodipine down to 2.5 mg daily, but did not check Glenda [name] chest x-ray at that time.  Patient also notes Glenda [name] tremor that had been present for over Glenda [name] month now.  She had been placed on amiodarone at the end of last year due to her history of atrial flutter, but this had been discontinued recently as it was thought to be worsening her respiratory status and tremor.  She lives in [name] senior living apartment and aides come in to help look after her.  Last night patient reported having subjective cold and hot chills,  but was never noted to have Glenda Glastetter fever when staff checked.  Staff at the facility noted patient have O2 saturation 70% on room air and placed on 2 L of nasal cannula oxygen with O2 sats 95%.  ED Course: Upon admission the emergency department patient was seen to be afebrile, pulse 52-61, and O2 saturations currently maintained on 2 L of nasal cannula oxygen.  Labs significant for globin 10.9, BUN 37, creatinine 1.12, high-sensitivity troponin III.  X-ray significant for right upper lobe opacity concerning for pneumonia.  Patient was started on Rocephin and azithromycin.  TRH called to admit.  Assessment & Plan:   Principal Problem:   CAP (community acquired pneumonia) Active Problems:   Essential hypertension   Normocytic anemia   Osteoarthritis   Chronic diastolic HF (heart failure) (HCC)   Chronic pain syndrome   Anxiety   DNR (do not resuscitate)   Paroxysmal atrial fibrillation (HCC)   Acute respiratory failure with hypoxia (HCC)  Goals of care: currently DNR, son Glenda [name] understands she's ill and we discussed today she's gotten progressively worse past few days we've taken care of her.  Treating as noted below, but I let him know that it'd be appropriate to have family come if they're able at this time.   Acute respiratory failure with hypoxia secondary to healthcare associated pneumonia  Acute Bronchitis:  Requiring 2 L [place], continued increase WOB - wet quality to breath sounds today, concerning for aspiration CXR 2/26 with interval worsening bilateral airspace process - multifocal pneumonia  NPO, SLP eval with concern for aspiration CT chest to further evaluate Continue ceftriaxone/azithromycin -> broaden to cefepime Urine strep (negative), urine legionella (negative), sputum cx pending Negative covid and flu testing 2/23 (positive for covid on 1/13) Wean O2 as tolerated Wheezing improved, stop steroids especially in setting of delirium Continue nebs prn  Acute Metabolic  Encephalopathy Likely hospital delirium - stop steroids, decrease temazepam dose (this is chronic, needs to be gradually d/c'd) Head CT SLP eval VBG (no hypercarbia), B12 wnl, folate wnl, ammonia wnl Delirium precautions W/u further as indicated  Essential hypertension: Blood pressures relatively stable.  Home blood pressure medications include hydralazine 25 mg twice daily and amlodipine 2.5 mg daily. -Continue current regimen as tolerated  Atrial flutter/atrial flutter with chronic anticoagulation: Patient with prior history of atrial flutter status post cardioversion back in 2018.  On physical exam rate is Glenda [name] regular heart rates appear to be less than 100. -Continue Eliquis  Diastolic congestive heart failure: Chronic.  Last echocardiogram from 2/23 noted EF to be 60 to 37% with diastolic parameters indeterminate.  Patient otherwise appears to be euvolemic.  -Strict intake and output - lasix x1 today -Daily weights  Renal insufficiency secondary to dehydration:  Baseline creatinine appears to be ~0.9 Recently with AKI with creatinine as high as 1.48 on 2/17 prior to admission (reportedly had been on lasix at that time) Rising today, start gentle IVF and follow -Will encourage p.o. fluids for now -pending labs.  Normocytic anemia:  Relatively stable, follow  Recent Covid -19 infection: COVID-19 on 04/03/2020, but only had mild symptoms at that time and did not require hospitalization or infusion. Patient was fully vaccinated with booster.  History of breast cancer: Patient with history of breast cancer to the left breast status post mastectomy -Continue anastrozole -Continue outpatient follow-up with Dr. Lindi Adie  Subclinical hypothyroidism: Last TSH was 5.76 on 05/08/2020. -Check free T4, TSH (wnl)  Chronic pain secondary to osteoarthritis -Continue current hydrocodone regimen with stool softeners  Anxiety -Continue Zoloft and Restoril   DVT prophylaxis:  eliquis Code Status: DNR Family Communication: son at bedside Disposition:   Status is: Inpatient  Remains inpatient appropriate because:Inpatient level of care appropriate due to severity of illness   Dispo: The patient is from: [facility]              Anticipated d/c is to: pending              Anticipated d/c date is: > 3 days              Patient currently is not medically stable to d/c.   Difficult to place patient No       Consultants:   none  Procedures:   none  Antimicrobials:  Anti-infectives (From admission, onward)   Start     Dose/Rate Route Frequency Ordered Stop   05/17/20 1500  ceFEPIme (MAXIPIME) 2 g in sodium chloride 0.9 % 100 mL IVPB        2 g 200 mL/hr over 30 Minutes Intravenous Every 24 hours 05/17/20 1407     05/15/20 1200  cefTRIAXone (ROCEPHIN) 2 g in sodium chloride 0.9 % 100 mL IVPB  Status:  Discontinued        2 g 200 mL/hr over 30 Minutes Intravenous Every 24 hours 05/10/2020 1452 05/17/20 1356   05/15/20 1200  azithromycin (ZITHROMAX) tablet 500 mg        500 mg Oral Daily 05/01/2020 1452 05/20/20 0959   05/16/2020  1245  cefTRIAXone (ROCEPHIN) 1 g in sodium chloride 0.9 % 100 mL IVPB        1 g 200 mL/hr over 30 Minutes Intravenous  Once 05/07/2020 1233 05/15/2020 1323   05/09/2020 1245  azithromycin (ZITHROMAX) 500 mg in sodium chloride 0.9 % 250 mL IVPB        500 mg 250 mL/hr over 60 Minutes Intravenous  Once 05/11/2020 1233 05/16/2020 1527         Subjective: Confused, difficult to understand Nonsensical speech   Objective: Vitals:   05/16/20 1516 05/16/20 2045 05/17/20 0536 05/17/20 1528  BP: (!) 144/76  138/79 (!) 103/54  Pulse: (!) 56 62 61 (!) 52  Resp: 13  16 14   Temp: 99.4 F (37.4 C) 98.5 F (36.9 C) (!) 97.5 F (36.4 C)   TempSrc: Oral Axillary Axillary Axillary  SpO2: 94%  91% 94%  Weight:      Height:        Intake/Output Summary (Last 24 hours) at 05/17/2020 1728 Last data filed at 05/17/2020  1503 Gross per 24 hour  Intake 391.45 ml  Output --  Net 391.45 ml   Filed Weights   05/16/2020 1742  Weight: 51.7 kg    Examination:  General: No acute distress. Cardiovascular: Heart sounds show Shreena Baines regular rate, and rhythm. . Lungs: increased wob, wet quality to breath sounds (transmitted upper airway sounds) Abdomen: Soft, nontender, nondistended  Neurological: confused, nonsensical speech. Moves all extremities 4. Cranial nerves II through XII grossly intact. Skin: Warm and dry. No rashes or lesions. Extremities: No clubbing or cyanosis. No edema.  Data Reviewed: I have personally reviewed following labs and imaging studies  CBC: Recent Labs  Lab 04/30/2020 1215 05/15/20 0859 05/16/20 0204 05/17/20 0058  WBC 6.5 6.4 8.8 6.1  NEUTROABS 5.3 5.3 7.3 5.4  HGB 10.1* 10.2* 10.1* 10.2*  HCT 32.5* 32.4* 32.2* 32.7*  MCV 92.9 91.5 92.3 93.4  PLT 217 214 215 517    Basic Metabolic Panel: Recent Labs  Lab 04/22/2020 1215 05/15/20 0859 05/16/20 0204 05/17/20 0058  NA 143 144 142 142  K 4.4 4.7 4.4 4.3  CL 106 108 107 108  CO2 25 23 26 26   GLUCOSE 81 93 90 117*  BUN 37* 37* 37* 34*  CREATININE 1.12* 1.06* 1.25* 0.95  CALCIUM 8.7* 8.8* 8.8* 8.8*  MG  --   --  2.4 2.4  PHOS  --   --  3.4 3.7    GFR: Estimated Creatinine Clearance: 27.9 mL/min (by C-G formula based on SCr of 0.95 mg/dL).  Liver Function Tests: Recent Labs  Lab 05/15/20 0859 05/16/20 0204 05/17/20 0058  AST 26 24 25   ALT 17 16 16   ALKPHOS 99 95 95  BILITOT 0.7 0.6 1.1  PROT 7.2 7.2 6.9  ALBUMIN 3.1* 3.1* 3.0*    CBG: Recent Labs  Lab 05/16/20 2049  GLUCAP 134*     Recent Results (from the past 240 hour(s))  Resp Panel by RT-PCR (Flu Zayleigh Stroh&B, Covid) Nasopharyngeal Swab     Status: None   Collection Time: 04/27/2020 11:47 AM   Specimen: Nasopharyngeal Swab; Nasopharyngeal(NP) swabs in vial transport medium  Result Value Ref Range Status   SARS Coronavirus 2 by RT PCR NEGATIVE NEGATIVE Final     Comment: (NOTE) SARS-CoV-2 target nucleic acids are NOT DETECTED.  The SARS-CoV-2 RNA is generally detectable in upper respiratory specimens during the acute phase of infection. The lowest concentration of SARS-CoV-2 viral copies this assay can  detect is 138 copies/mL. Keslyn Teater negative result does not preclude SARS-Cov-2 infection and should not be used as the sole basis for treatment or other patient management decisions. Miamor Ayler negative result may occur with  improper specimen collection/handling, submission of specimen other than nasopharyngeal swab, presence of viral mutation(s) within the areas targeted by this assay, and inadequate number of viral copies(<138 copies/mL). Tait Balistreri negative result must be combined with clinical observations, patient history, and epidemiological information. The expected result is Negative.  Fact Sheet for Patients:  EntrepreneurPulse.com.au  Fact Sheet for Healthcare Providers:  IncredibleEmployment.be  This test is no t yet approved or cleared by the [place] FDA and  has been authorized for detection and/or diagnosis of SARS-CoV-2 by FDA under an Emergency Use Authorization (EUA). This EUA will remain  in effect (meaning this test can be used) for the duration of the COVID-19 declaration under Section 564(b)(1) of the Act, 21 U.S.C.section 360bbb-3(b)(1), unless the authorization is terminated  or revoked sooner.       Influenza Yusuke Beza by PCR NEGATIVE NEGATIVE Final   Influenza B by PCR NEGATIVE NEGATIVE Final    Comment: (NOTE) The Xpert Xpress SARS-CoV-2/FLU/RSV plus assay is intended as an aid in the diagnosis of influenza from Nasopharyngeal swab specimens and should not be used as Raelea Gosse sole basis for treatment. Nasal washings and aspirates are unacceptable for Xpert Xpress SARS-CoV-2/FLU/RSV testing.  Fact Sheet for Patients: EntrepreneurPulse.com.au  Fact Sheet for Healthcare  Providers: IncredibleEmployment.be  This test is not yet approved or cleared by the [place] FDA and has been authorized for detection and/or diagnosis of SARS-CoV-2 by FDA under an Emergency Use Authorization (EUA). This EUA will remain in effect (meaning this test can be used) for the duration of the COVID-19 declaration under Section 564(b)(1) of the Act, 21 U.S.C. section 360bbb-3(b)(1), unless the authorization is terminated or revoked.  Performed at [facility], [place] [address], [place], [place] [address]   Culture, blood (routine x 2)     Status: None (Preliminary result)   Collection Time: 05/13/2020 12:03 PM   Specimen: BLOOD RIGHT FOREARM  Result Value Ref Range Status   Specimen Description BLOOD RIGHT FOREARM  Final   Special Requests   Final    BOTTLES DRAWN AEROBIC AND ANAEROBIC Blood Culture results may not be optimal due to an excessive volume of blood received in culture bottles   Culture   Final    NO GROWTH 3 DAYS Performed at [facility], [place] [address] [place], [place] [address]    Report Status PENDING  Incomplete  Culture, blood (routine x 2)     Status: None (Preliminary result)   Collection Time: 04/28/2020 12:08 PM   Specimen: BLOOD RIGHT WRIST  Result Value Ref Range Status   Specimen Description BLOOD RIGHT WRIST  Final   Special Requests   Final    BOTTLES DRAWN AEROBIC AND ANAEROBIC Blood Culture adequate volume   Culture   Final    NO GROWTH 3 DAYS Performed at [facility], [place] [address] [place], [place] [address]    Report Status PENDING  Incomplete  MRSA PCR Screening     Status: None   Collection Time: 04/29/2020  5:11 PM   Specimen: Nasal Mucosa; Nasopharyngeal  Result Value Ref Range Status   MRSA by PCR NEGATIVE NEGATIVE Final    Comment:        The GeneXpert MRSA Assay (FDA approved for NASAL specimens only), is one  component of Rox Mcgriff comprehensive MRSA colonization surveillance program. It is  not intended to diagnose MRSA infection nor to guide or monitor treatment for MRSA infections. Performed at [facility], [place] [address] [place], [place] [address]          Radiology Studies: DG CHEST PORT 1 VIEW  Result Date: 05/17/2020 CLINICAL DATA:  Shortness of breath. EXAM: PORTABLE CHEST 1 VIEW COMPARISON:  05/16/2020 FINDINGS: Stable moderate consolidation over the right upper lobe with slightly less volume loss. Mild interval worsening patchy density over the right mid to lower lung and possibly left lateral mid to upper lung and left retrocardiac region. No significant effusion. Mild stable cardiomegaly. Remainder the exam is unchanged. IMPRESSION: 1. Mild interval worsening bilateral airspace process likely multifocal pneumonia. 2. Stable cardiomegaly. Electronically Signed   By: Marin Olp M.D.   On: 05/17/2020 08:54   DG CHEST PORT 1 VIEW  Result Date: 05/16/2020 CLINICAL DATA:  Shortness of breath EXAM: PORTABLE CHEST 1 VIEW COMPARISON:  May 14, 2020 FINDINGS: There is persistent airspace opacity consistent with pneumonia in the right upper lobe. There is slightly more volume loss in this area compared to recent study. There is now patchy infiltrate in the right and left lower lobe regions with ill-defined consolidation in Bryan Omura portion of the left lower lobe. Heart is enlarged with pulmonary vascularity normal. There is aortic atherosclerosis. Status post midthoracic vertebral body kyphoplasty procedures. No adenopathy appreciable by radiography. IMPRESSION: Persistent apparent pneumonia right upper lobe with increase in volume loss in this area compared to recent study. New areas of infiltrate in both lower lobes consistent with progression of pneumonia. Stable cardiomegaly. Aortic Atherosclerosis (ICD10-I70.0). Electronically Signed   By: Lowella Grip III M.D.   On: 05/16/2020 15:57        Scheduled Meds: . amLODipine  2.5 mg Oral Daily  . anastrozole  1  mg Oral Daily  . apixaban  2.5 mg Oral BID  . azithromycin  500 mg Oral Daily  . guaiFENesin  600 mg Oral BID  . hydrALAZINE  25 mg Oral BID  . HYDROcodone-acetaminophen  1 tablet Oral BID  . hydroxypropyl methylcellulose / hypromellose  1 drop Both Eyes QID  . pantoprazole  40 mg Oral Daily  . polyethylene glycol  17 g Oral Daily  . sertraline  25 mg Oral Daily  . temazepam  7.5 mg Oral QHS   Continuous Infusions: . ceFEPime (MAXIPIME) IV 2 g (05/17/20 1500)     LOS: 3 days    Time spent: over 30 min    Fayrene Helper, MD [facility]   To contact the attending provider between 7A-7P or the covering provider during after hours 7P-7A, please log into the web site [email or web address] and access using universal [place] password for that web site. If you do not have the password, please call the hospital operator.  05/17/2020, 5:28 PM

## 2020-05-18 DIAGNOSIS — Z7189 Other specified counseling: Secondary | ICD-10-CM | POA: Diagnosis not present

## 2020-05-18 DIAGNOSIS — J189 Pneumonia, unspecified organism: Secondary | ICD-10-CM | POA: Diagnosis not present

## 2020-05-18 DIAGNOSIS — Z515 Encounter for palliative care: Secondary | ICD-10-CM

## 2020-05-18 DIAGNOSIS — Z66 Do not resuscitate: Secondary | ICD-10-CM | POA: Diagnosis not present

## 2020-05-18 LAB — COMPREHENSIVE METABOLIC PANEL
ALT: 17 U/L (ref 0–44)
AST: 45 U/L — ABNORMAL HIGH (ref 15–41)
Albumin: 3.1 g/dL — ABNORMAL LOW (ref 3.5–5.0)
Alkaline Phosphatase: 95 U/L (ref 38–126)
Anion gap: 12 (ref 5–15)
BUN: 37 mg/dL — ABNORMAL HIGH (ref 8–23)
CO2: 26 mmol/L (ref 22–32)
Calcium: 8.8 mg/dL — ABNORMAL LOW (ref 8.9–10.3)
Chloride: 106 mmol/L (ref 98–111)
Creatinine, Ser: 1.1 mg/dL — ABNORMAL HIGH (ref 0.44–1.00)
GFR, Estimated: 47 mL/min — ABNORMAL LOW (ref >60)
Glucose, Bld: 71 mg/dL (ref 70–99)
Potassium: 3.9 mmol/L (ref 3.5–5.1)
Sodium: 144 mmol/L (ref 135–145)
Total Bilirubin: 1.2 mg/dL (ref 0.3–1.2)
Total Protein: 7.1 g/dL (ref 6.5–8.1)

## 2020-05-18 LAB — CBC WITH DIFFERENTIAL/PLATELET
Abs Immature Granulocytes: 0.1 10*3/uL — ABNORMAL HIGH (ref 0.00–0.07)
Basophils Absolute: 0 10*3/uL (ref 0.0–0.1)
Basophils Relative: 0 %
Eosinophils Absolute: 0 10*3/uL (ref 0.0–0.5)
Eosinophils Relative: 0 %
HCT: 33.4 % — ABNORMAL LOW (ref 36.0–46.0)
Hemoglobin: 10.4 g/dL — ABNORMAL LOW (ref 12.0–15.0)
Immature Granulocytes: 1 %
Lymphocytes Relative: 3 %
Lymphs Abs: 0.3 10*3/uL — ABNORMAL LOW (ref 0.7–4.0)
MCH: 29.1 pg (ref 26.0–34.0)
MCHC: 31.1 g/dL (ref 30.0–36.0)
MCV: 93.3 fL (ref 80.0–100.0)
Monocytes Absolute: 0.7 10*3/uL (ref 0.1–1.0)
Monocytes Relative: 7 %
Neutro Abs: 9.6 10*3/uL — ABNORMAL HIGH (ref 1.7–7.7)
Neutrophils Relative %: 89 %
Platelets: 206 10*3/uL (ref 150–400)
RBC: 3.58 MIL/uL — ABNORMAL LOW (ref 3.87–5.11)
RDW: 16.6 % — ABNORMAL HIGH (ref 11.5–15.5)
WBC: 10.8 10*3/uL — ABNORMAL HIGH (ref 4.0–10.5)
nRBC: 0 % (ref 0.0–0.2)

## 2020-05-18 LAB — PROCALCITONIN: Procalcitonin: 0.31 ng/mL

## 2020-05-18 LAB — MAGNESIUM: Magnesium: 2.3 mg/dL (ref 1.7–2.4)

## 2020-05-18 LAB — PHOSPHORUS: Phosphorus: 3.8 mg/dL (ref 2.5–4.6)

## 2020-05-18 MED ORDER — LORAZEPAM 2 MG/ML IJ SOLN
0.5000 mg | INTRAMUSCULAR | Status: DC | PRN
  Filled 2020-05-18: qty 1

## 2020-05-18 MED ORDER — METRONIDAZOLE IN NACL 5-0.79 MG/ML-% IV SOLN
500.0000 mg | Freq: Three times a day (TID) | INTRAVENOUS | Status: DC
  Administered 2020-05-18 – 2020-05-19 (×4): 500 mg via INTRAVENOUS
  Filled 2020-05-18 (×4): qty 100

## 2020-05-18 MED ORDER — MORPHINE SULFATE (PF) 2 MG/ML IV SOLN
0.5000 mg | INTRAVENOUS | Status: DC | PRN
  Administered 2020-05-18: 0.5 mg via INTRAVENOUS
  Filled 2020-05-18: qty 1

## 2020-05-18 MED ORDER — ONDANSETRON HCL 4 MG/2ML IJ SOLN: 4.0000 mg | Freq: Four times a day (QID) | INTRAMUSCULAR | Status: DC | PRN

## 2020-05-18 MED ORDER — MORPHINE SULFATE (PF) 2 MG/ML IV SOLN
1.0000 mg | Freq: Four times a day (QID) | INTRAVENOUS | Status: DC
Start: 2020-05-18 — End: 2020-05-19
  Administered 2020-05-18 – 2020-05-19 (×4): 1 mg via INTRAVENOUS
  Filled 2020-05-18 (×4): qty 1

## 2020-05-18 MED ORDER — MORPHINE SULFATE (PF) 2 MG/ML IV SOLN
0.5000 mg | INTRAVENOUS | Status: DC | PRN
  Administered 2020-05-19: 1 mg via INTRAVENOUS
  Filled 2020-05-18: qty 1

## 2020-05-18 MED ORDER — PANTOPRAZOLE SODIUM 40 MG IV SOLR
40.0000 mg | INTRAVENOUS | Status: DC
  Administered 2020-05-18 – 2020-05-19 (×2): 40 mg via INTRAVENOUS
  Filled 2020-05-18 (×2): qty 40

## 2020-05-18 MED ORDER — LORAZEPAM 2 MG/ML IJ SOLN
0.5000 mg | INTRAMUSCULAR | Status: DC | PRN
  Administered 2020-05-18: 0.5 mg via INTRAVENOUS

## 2020-05-18 MED ORDER — SODIUM CHLORIDE 0.9 % IV SOLN
500.0000 mg | INTRAVENOUS | Status: AC
  Administered 2020-05-18: 500 mg via INTRAVENOUS
  Filled 2020-05-18: qty 500

## 2020-05-18 MED ORDER — BISACODYL 10 MG RE SUPP: 10.0000 mg | Freq: Every day | RECTAL | Status: DC | PRN

## 2020-05-18 MED ORDER — GLYCOPYRROLATE 0.2 MG/ML IJ SOLN
0.4000 mg | INTRAMUSCULAR | Status: DC
  Administered 2020-05-18 – 2020-05-19 (×7): 0.4 mg via INTRAVENOUS
  Filled 2020-05-18 (×7): qty 2

## 2020-05-18 MED ORDER — LORAZEPAM 2 MG/ML IJ SOLN
1.0000 mg | Freq: Four times a day (QID) | INTRAMUSCULAR | Status: DC
  Administered 2020-05-18 – 2020-05-19 (×4): 1 mg via INTRAVENOUS
  Filled 2020-05-18 (×4): qty 1

## 2020-05-18 NOTE — Progress Notes (Signed)
PT Cancellation Note  Patient Details Name: YELITZA REACH MRN: [id number] DOB: Dec 04, 1924   Cancelled Treatment:    Reason Eval/Treat Not Completed: Medical issues which prohibited therapy. Pt with progressive medical decline. Remains confused, nonsensical speech. Unable to participate in PT. PT to continue to follow and resume Rx when pt able.   Lorriane Shire 05/18/2020, 11:41 AM  Lorrin Goodell, PT  Office # [phone] Pager [phone]

## 2020-05-18 NOTE — Plan of Care (Signed)

## 2020-05-18 NOTE — Progress Notes (Signed)
PROGRESS NOTE    Glenda [name]  TIR:[id number] DOB: 1924-05-24 DOA: 05/11/2020 PCP: Marin Olp, MD   Chief Complaint  Patient presents with  . Shortness of Breath   Brief Narrative:  Glenda [name] is a 87 y.o. female with medical history significant of hypertension, aortic stenosis, atrial flutter on Eliquis, chronic pain syndrome, breast cancer status post left mastectomy, and COVID-19 infection who presents with complaints of shortness of breath.  History is obtained from the patient with assistance of her son who is present at bedside who brought her hearing aids.  Over the last month patient reports that her breathing has not been well.  She had been diagnosed with COVID-19 on 04/03/2020, but only had mild symptoms at that time and did not require hospitalization or infusion.  She is fully vaccinated including her booster.  Her breathing became acutely worse over the last few days and she reports complaints of intermittent nonproductive cough, wheezing and lower extremity leg swelling.  Associated symptoms include decreased appetite, weight loss of 3 pounds, and increased thirst.  She had been being followed by her PCP and cardiology.  Due to elevated BNP she had been tried on increased dose of Lasix for a few days earlier this month.  Seen by cardiology yesterday due to persistent issues with her breathing. Cardiology suspected symptoms were more likely pulmonary related and decreased her amlodipine down to 2.5 mg daily, but did not check a chest x-ray at that time.  Patient also notes a tremor that had been present for over a month now.  She had been placed on amiodarone at the end of last year due to her history of atrial flutter, but this had been discontinued recently as it was thought to be worsening her respiratory status and tremor.  She lives in a senior living apartment and aides come in to help look after her.  Last night patient reported having subjective cold and hot chills,  but was never noted to have a fever when staff checked.  Staff at the facility noted patient have O2 saturation 70% on room air and placed on 2 L of nasal cannula oxygen with O2 sats 95%.  ED Course: Upon admission the emergency department patient was seen to be afebrile, pulse 52-61, and O2 saturations currently maintained on 2 L of nasal cannula oxygen.  Labs significant for globin 10.9, BUN 37, creatinine 1.12, high-sensitivity troponin III.  X-ray significant for right upper lobe opacity concerning for pneumonia.  Patient was started on Rocephin and azithromycin.  TRH called to admit.  Assessment & Plan:   Principal Problem:   CAP (community acquired pneumonia) Active Problems:   Essential hypertension   Normocytic anemia   Osteoarthritis   Chronic diastolic HF (heart failure) (HCC)   Chronic pain syndrome   Anxiety   DNR (do not resuscitate)   Paroxysmal atrial fibrillation (HCC)   Acute respiratory failure with hypoxia (HCC)  Goals of care: currently DNR, son [name] understands she's ill and we discussed today she's gotten progressively worse past few days we've taken care of her - which has continued today.  Treating as noted below, but I let him know that it'd be appropriate to have family come if they're able at this time.  Discussed today that if she does not turn around soon, I think comfort measures would be appropriate. Haldol/ativan for agitation/anxiety, low dose morphine for discomfort  Acute respiratory failure with hypoxia secondary to healthcare associated pneumonia  Acute Bronchitis:  Requiring 2 L [place], continued increase WOB  CT chest with extensive diffuse right lung and (to lesser degree) left lung ground glass and consolidative airspace opacities - discussed with rads, most consistent with multifocal pneumonia No hx hemoptysis NPO, SLP eval with concern for aspiration - suspect she has some aspiration as well Continue ceftriaxone/azithromycin -> broaden to  cefepime/flagyl/azithro - MRSA pcr negative Urine strep (negative), urine legionella (negative), sputum cx pending Negative covid and flu testing 2/23 (positive for covid on 1/13) Wean O2 as tolerated Wheezing improved, stop steroids especially in setting of delirium Continue nebs prn  Acute Metabolic Encephalopathy Likely hospital delirium - stop steroids Temazepam on hold given NPO  low dose IV ativan prn. Haldol prn. Head CT - mild chronic ischemic white matter disease SLP eval - pending VBG (no hypercarbia), B12 wnl, folate wnl, ammonia wnl Delirium precautions W/u further as indicated  Essential hypertension: Blood pressures relatively stable.  Home blood pressure medications include hydralazine 25 mg twice daily and amlodipine 2.5 mg daily. -Continue current regimen as tolerated - on hold given NPO status  Atrial flutter/atrial flutter with chronic anticoagulation: Patient with prior history of atrial flutter status post cardioversion back in 2018.  On physical exam rate is a regular heart rates appear to be less than 100. -Continue Eliquis (on hold with NPO, will continue to hold anticoagulation for now pending further goc decisions)  Diastolic congestive heart failure: Chronic.  Last echocardiogram from 2/23 noted EF to be 60 to 56% with diastolic parameters indeterminate.  Patient otherwise appears to be euvolemic.  -Strict intake and output -Daily weights  Renal insufficiency secondary to dehydration:  Baseline creatinine appears to be ~0.9 Recently with AKI with creatinine as high as 1.48 on 2/17 prior to admission (reportedly had been on lasix at that time) - continue to monitor today, fluctuating creatining - I/O, daily weights  Normocytic anemia:  Relatively stable, follow  Recent Covid -19 infection: COVID-19 on 04/03/2020, but only had mild symptoms at that time and did not require hospitalization or infusion. Patient was fully vaccinated with  booster.  History of breast cancer: Patient with history of breast cancer to the left breast status post mastectomy -Continue anastrozole -Continue outpatient follow-up with Dr. Lindi Adie  Subclinical hypothyroidism: Last TSH was 5.76 on 05/08/2020. -Check free T4, TSH (wnl)  Chronic pain secondary to osteoarthritis -Continue current hydrocodone regimen with stool softeners  Anxiety -Continue Zoloft and Restoril (on hold as noted above)   DVT prophylaxis: eliquis Code Status: DNR Family Communication: son at bedside Disposition:   Status is: Inpatient  Remains inpatient appropriate because:Inpatient level of care appropriate due to severity of illness   Dispo: The patient is from: [facility]              Anticipated d/c is to: pending              Anticipated d/c date is: > 3 days              Patient currently is not medically stable to d/c.   Difficult to place patient No       Consultants:   none  Procedures:   none  Antimicrobials:  Anti-infectives (From admission, onward)   Start     Dose/Rate Route Frequency Ordered Stop   05/18/20 0900  azithromycin (ZITHROMAX) 500 mg in sodium chloride 0.9 % 250 mL IVPB        500 mg 250 mL/hr over 60 Minutes Intravenous Every 24 hours  05/18/20 0804 2020/06/08 0859   05/17/20 1500  ceFEPIme (MAXIPIME) 2 g in sodium chloride 0.9 % 100 mL IVPB        2 g 200 mL/hr over 30 Minutes Intravenous Every 24 hours 05/17/20 1407     05/15/20 1200  cefTRIAXone (ROCEPHIN) 2 g in sodium chloride 0.9 % 100 mL IVPB  Status:  Discontinued        2 g 200 mL/hr over 30 Minutes Intravenous Every 24 hours 05/09/2020 1452 05/17/20 1356   05/15/20 1200  azithromycin (ZITHROMAX) tablet 500 mg  Status:  Discontinued        500 mg Oral Daily 05/01/2020 1452 05/18/20 0804   05/07/2020 1245  cefTRIAXone (ROCEPHIN) 1 g in sodium chloride 0.9 % 100 mL IVPB        1 g 200 mL/hr over 30 Minutes Intravenous  Once 05/17/2020 1233 05/13/2020 1323    05/12/2020 1245  azithromycin (ZITHROMAX) 500 mg in sodium chloride 0.9 % 250 mL IVPB        500 mg 250 mL/hr over 60 Minutes Intravenous  Once 05/09/2020 1233 05/04/2020 1527         Subjective: Nonsensical speech, unable to understand  Objective: Vitals:   05/17/20 0536 05/17/20 1528 05/17/20 2059 05/18/20 0852  BP: 138/79 (!) 103/54 (!) 138/96   Pulse: 61 (!) 52 71   Resp: 16 14 20    Temp: (!) 97.5 F (36.4 C)  97.8 F (36.6 C)   TempSrc: Axillary Axillary Axillary   SpO2: 91% 94% 90% 95%  Weight:      Height:        Intake/Output Summary (Last 24 hours) at 05/18/2020 1010 Last data filed at 05/18/2020 0930 Gross per 24 hour  Intake 100 ml  Output 400 ml  Net -300 ml   Filed Weights   05/17/2020 1742  Weight: 51.7 kg    Examination:  General: No acute distress. Cardiovascular: Heart sounds show a regular rate, and rhythm.  Lungs: crackes >R, increased WOB Abdomen: Soft, nontender, nondistended  Neurological: disoriented, nonsensical speech, mumbling, incoherent responses - moving all extremities.  Skin: Warm and dry. No rashes or lesions. Extremities: No clubbing or cyanosis. No edema.  Data Reviewed: I have personally reviewed following labs and imaging studies  CBC: Recent Labs  Lab 05/08/2020 1215 05/15/20 0859 05/16/20 0204 05/17/20 0058 05/18/20 0342  WBC 6.5 6.4 8.8 6.1 10.8*  NEUTROABS 5.3 5.3 7.3 5.4 9.6*  HGB 10.1* 10.2* 10.1* 10.2* 10.4*  HCT 32.5* 32.4* 32.2* 32.7* 33.4*  MCV 92.9 91.5 92.3 93.4 93.3  PLT 217 214 215 214 884    Basic Metabolic Panel: Recent Labs  Lab 04/26/2020 1215 05/15/20 0859 05/16/20 0204 05/17/20 0058 05/18/20 0342  NA 143 144 142 142 144  K 4.4 4.7 4.4 4.3 3.9  CL 106 108 107 108 106  CO2 25 23 26 26 26   GLUCOSE 81 93 90 117* 71  BUN 37* 37* 37* 34* 37*  CREATININE 1.12* 1.06* 1.25* 0.95 1.10*  CALCIUM 8.7* 8.8* 8.8* 8.8* 8.8*  MG  --   --  2.4 2.4 2.3  PHOS  --   --  3.4 3.7 3.8    GFR: Estimated  Creatinine Clearance: 24.1 mL/min (A) (by C-G formula based on SCr of 1.1 mg/dL (H)).  Liver Function Tests: Recent Labs  Lab 05/15/20 0859 05/16/20 0204 05/17/20 0058 05/18/20 0342  AST 26 24 25  45*  ALT 17 16 16 17   ALKPHOS 99 95 95  95  BILITOT 0.7 0.6 1.1 1.2  PROT 7.2 7.2 6.9 7.1  ALBUMIN 3.1* 3.1* 3.0* 3.1*    CBG: Recent Labs  Lab 05/16/20 2049  GLUCAP 134*     Recent Results (from the past 240 hour(s))  Resp Panel by RT-PCR (Flu A&B, Covid) Nasopharyngeal Swab     Status: None   Collection Time: 05/13/2020 11:47 AM   Specimen: Nasopharyngeal Swab; Nasopharyngeal(NP) swabs in vial transport medium  Result Value Ref Range Status   SARS Coronavirus 2 by RT PCR NEGATIVE NEGATIVE Final    Comment: (NOTE) SARS-CoV-2 target nucleic acids are NOT DETECTED.  The SARS-CoV-2 RNA is generally detectable in upper respiratory specimens during the acute phase of infection. The lowest concentration of SARS-CoV-2 viral copies this assay can detect is 138 copies/mL. A negative result does not preclude SARS-Cov-2 infection and should not be used as the sole basis for treatment or other patient management decisions. A negative result may occur with  improper specimen collection/handling, submission of specimen other than nasopharyngeal swab, presence of viral mutation(s) within the areas targeted by this assay, and inadequate number of viral copies(<138 copies/mL). A negative result must be combined with clinical observations, patient history, and epidemiological information. The expected result is Negative.  Fact Sheet for Patients:  EntrepreneurPulse.com.au  Fact Sheet for Healthcare Providers:  IncredibleEmployment.be  This test is no t yet approved or cleared by the [place] FDA and  has been authorized for detection and/or diagnosis of SARS-CoV-2 by FDA under an Emergency Use Authorization (EUA). This EUA will remain  in effect  (meaning this test can be used) for the duration of the COVID-19 declaration under Section 564(b)(1) of the Act, 21 U.S.C.section 360bbb-3(b)(1), unless the authorization is terminated  or revoked sooner.       Influenza A by PCR NEGATIVE NEGATIVE Final   Influenza B by PCR NEGATIVE NEGATIVE Final    Comment: (NOTE) The Xpert Xpress SARS-CoV-2/FLU/RSV plus assay is intended as an aid in the diagnosis of influenza from Nasopharyngeal swab specimens and should not be used as a sole basis for treatment. Nasal washings and aspirates are unacceptable for Xpert Xpress SARS-CoV-2/FLU/RSV testing.  Fact Sheet for Patients: EntrepreneurPulse.com.au  Fact Sheet for Healthcare Providers: IncredibleEmployment.be  This test is not yet approved or cleared by the [place] FDA and has been authorized for detection and/or diagnosis of SARS-CoV-2 by FDA under an Emergency Use Authorization (EUA). This EUA will remain in effect (meaning this test can be used) for the duration of the COVID-19 declaration under Section 564(b)(1) of the Act, 21 U.S.C. section 360bbb-3(b)(1), unless the authorization is terminated or revoked.  Performed at [facility], [place] [address] [place], [place] [address]   Culture, blood (routine x 2)     Status: None (Preliminary result)   Collection Time: 05/03/2020 12:03 PM   Specimen: BLOOD RIGHT FOREARM  Result Value Ref Range Status   Specimen Description BLOOD RIGHT FOREARM  Final   Special Requests   Final    BOTTLES DRAWN AEROBIC AND ANAEROBIC Blood Culture results may not be optimal due to an excessive volume of blood received in culture bottles   Culture   Final    NO GROWTH 3 DAYS Performed at [facility], [place] [address] [place], [place] [address]    Report Status PENDING  Incomplete  Culture, blood (routine x 2)     Status: None (Preliminary result)   Collection Time: 05/08/2020 12:08 PM  Specimen: BLOOD  RIGHT WRIST  Result Value Ref Range Status   Specimen Description BLOOD RIGHT WRIST  Final   Special Requests   Final    BOTTLES DRAWN AEROBIC AND ANAEROBIC Blood Culture adequate volume   Culture   Final    NO GROWTH 3 DAYS Performed at [facility], [address] [place], [place] [address]    Report Status PENDING  Incomplete  MRSA PCR Screening     Status: None   Collection Time: 05/11/2020  5:11 PM   Specimen: Nasal Mucosa; Nasopharyngeal  Result Value Ref Range Status   MRSA by PCR NEGATIVE NEGATIVE Final    Comment:        The GeneXpert MRSA Assay (FDA approved for NASAL specimens only), is one component of a comprehensive MRSA colonization surveillance program. It is not intended to diagnose MRSA infection nor to guide or monitor treatment for MRSA infections. Performed at [facility], [place] [address], [place], [place] [address]          Radiology Studies: CT HEAD WO CONTRAST  Result Date: 05/17/2020 CLINICAL DATA:  Delirium. EXAM: CT HEAD WITHOUT CONTRAST TECHNIQUE: Contiguous axial images were obtained from the base of the skull through the vertex without intravenous contrast. COMPARISON:  None. FINDINGS: Brain: Mild chronic ischemic white matter disease is noted. No mass effect or midline shift is noted. Ventricular size is within normal limits. There is no evidence of mass lesion, hemorrhage or acute infarction. Vascular: No hyperdense vessel or unexpected calcification. Skull: Normal. Negative for fracture or focal lesion. Sinuses/Orbits: No acute finding. Other: None. IMPRESSION: Mild chronic ischemic white matter disease. No acute intracranial abnormality seen. Electronically Signed   By: Marijo Conception M.D.   On: 05/17/2020 18:50   CT CHEST WO CONTRAST  Result Date: 05/17/2020 CLINICAL DATA:  Delirium, respiratory failure. EXAM: CT CHEST WITHOUT CONTRAST TECHNIQUE: Multidetector CT imaging of the chest was performed following the standard protocol  without IV contrast. COMPARISON:  CT angio chest 02/11/2019, chest x-ray 05/17/2020, chest x-ray 05/16/2020, chest x-ray 05/07/2020 FINDINGS: Cardiovascular: Cardiomegaly. No significant pericardial effusion. The thoracic aorta is normal in caliber. Aortic root calcifications. At least moderate calcified and noncalcified atherosclerotic plaque of the thoracic aorta. At least mild three-vessel coronary artery calcifications. Mild mitral annular calcifications likely. Enlarged main pulmonary artery. Mediastinum/Nodes: No enlarged mediastinal or axillary lymph nodes. No gross hilar adenopathy, noting limited sensitivity for the detection of hilar adenopathy on this noncontrast study. Thyroid gland, trachea, and esophagus demonstrate no significant findings. Lungs/Pleura: Expiratory phase of respiration. Extensive diffuse right lung and to lesser degree left lung ground-glass and consolidative airspace opacities. Small volume right pleural effusion. No pneumothorax. Markedly limited evaluation for pulmonary nodule and mass. Upper Abdomen: No acute abnormality. Musculoskeletal: No chest wall abnormality. No suspicious lytic or blastic osseous lesions. No acute displaced fracture. Multilevel degenerative changes of the spine with chronic midthoracic compression fracture status post kyphoplasty. IMPRESSION: 1. Extensive diffuse right lung and to lesser degree left lung ground-glass and consolidative airspace opacities that could represent a combination of infection versus inflammation (aspiration pneumonia) versus alveolar hemorrhage. Followup PA and lateral chest X-ray is recommended in 3-4 weeks following therapy to ensure resolution and exclude underlying malignancy. 2. Associated small volume right pleural effusion. 3. Enlarged main pulmonary artery likely representing pulmonary hypertension. 4. Cardiomegaly. 5.  Aortic Atherosclerosis (ICD10-I70.0). Electronically Signed   By: Iven Finn M.D.   On: 05/17/2020  18:59   DG CHEST PORT  1 VIEW  Result Date: 05/17/2020 CLINICAL DATA:  Shortness of breath. EXAM: PORTABLE CHEST 1 VIEW COMPARISON:  05/16/2020 FINDINGS: Stable moderate consolidation over the right upper lobe with slightly less volume loss. Mild interval worsening patchy density over the right mid to lower lung and possibly left lateral mid to upper lung and left retrocardiac region. No significant effusion. Mild stable cardiomegaly. Remainder the exam is unchanged. IMPRESSION: 1. Mild interval worsening bilateral airspace process likely multifocal pneumonia. 2. Stable cardiomegaly. Electronically Signed   By: Marin Olp M.D.   On: 05/17/2020 08:54   DG CHEST PORT 1 VIEW  Result Date: 05/16/2020 CLINICAL DATA:  Shortness of breath EXAM: PORTABLE CHEST 1 VIEW COMPARISON:  May 14, 2020 FINDINGS: There is persistent airspace opacity consistent with pneumonia in the right upper lobe. There is slightly more volume loss in this area compared to recent study. There is now patchy infiltrate in the right and left lower lobe regions with ill-defined consolidation in a portion of the left lower lobe. Heart is enlarged with pulmonary vascularity normal. There is aortic atherosclerosis. Status post midthoracic vertebral body kyphoplasty procedures. No adenopathy appreciable by radiography. IMPRESSION: Persistent apparent pneumonia right upper lobe with increase in volume loss in this area compared to recent study. New areas of infiltrate in both lower lobes consistent with progression of pneumonia. Stable cardiomegaly. Aortic Atherosclerosis (ICD10-I70.0). Electronically Signed   By: Lowella Grip III M.D.   On: 05/16/2020 15:57        Scheduled Meds: . anastrozole  1 mg Oral Daily  . apixaban  2.5 mg Oral BID  . guaiFENesin  600 mg Oral BID  . hydrALAZINE  25 mg Oral BID  . HYDROcodone-acetaminophen  1 tablet Oral BID  . hydroxypropyl methylcellulose / hypromellose  1 drop Both Eyes QID  .  pantoprazole (PROTONIX) IV  40 mg Intravenous Q24H  . polyethylene glycol  17 g Oral Daily  . sertraline  25 mg Oral Daily  . temazepam  7.5 mg Oral QHS   Continuous Infusions: . azithromycin 500 mg (05/18/20 0922)  . ceFEPime (MAXIPIME) IV 2 g (05/17/20 1500)     LOS: 4 days    Time spent: over 30 min    Fayrene Helper, MD [facility]   To contact the attending provider between 7A-7P or the covering provider during after hours 7P-7A, please log into the web site [email or web address] and access using universal [place] password for that web site. If you do not have the password, please call the hospital operator.  05/18/2020, 10:10 AM

## 2020-05-18 NOTE — Consult Note (Addendum)
Palliative Medicine Inpatient Consult Note  Reason for consult:  Goals of Care  HPI:  Per intake H&P --> Glenda Z Robertsis a 87 y.o.femalewith medical history significant ofhypertension,aortic stenosis, atrial flutter on Eliquis,chronic pain syndrome,breast cancer status postleftmastectomy,and COVID-19 infection who presents with complaints of shortness of breath.  Palliative care has been asked to see Glenda [name] as she has been progressively declining since hospitalization .  Clinical Assessment/Goals of Care:  *Please note that this is a verbal dictation therefore any spelling or grammatical errors are due to the "[place]" system interpretation.  I have reviewed medical records including EPIC notes, labs and imaging, received report from bedside RN, assessed the patient who was lying in bed noted to be making some groaning sounds and grimacing.    I met with patient son, Glenda [name] to further discuss diagnosis prognosis, GOC, EOL wishes, disposition and options.   I introduced Palliative Medicine as specialized medical care for people living with serious illness. It focuses on providing relief from the symptoms and stress of a serious illness. The goal is to improve quality of life for both the patient and the family.  [name] shares with me that his mother is from [place] originally though had living in [place], [place] for many years. She has lived in the [place] area for about fifty years or so. She is a widow. She has a son, daughter, and stepdaughter. 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 She is a woman who got joy out of being independent. She is of the [facility] denomination.  Prior to hospitalization Glenda [name] had been living at [facility] independent living. She was able to do most things for herself and would hit her "pendent button" if additional aid was needed.   A detailed discussion was had today regarding  advanced directives - there are none on file though patients son, [name] is her closest living next of kin.    Concepts specific to code status, artifical feeding and hydration, continued IV antibiotics and rehospitalization was had. We reviewed her decline since admission. [name] shares that he does not want to see his mother suffer for any reason. He states that she does have an established DNR on file.   [name] and I reviewed Glenda [name]'s present medical conditions inclusive of HCAP, AFIB, delirium, and past covid-19. I shared with him that many of our patients who are in positions such as [facility]'s sadly do not recover despite antibiotic therapy. We reviewed that instead of improving since admission she continues to be acutely declining. He understood this. He shares with me that she has lived 56 years. He states that this is all a family member would really ask for. Allowed him time to express himself.   [name] states that his two sisters plan on coming but in the mean time we should make Noelani comfortable. We talked about transition to comfort measures in house and what that would entail inclusive of medications to control pain, dyspnea, agitation, nausea, itching, and hiccups.  We discussed stopping all uneccessary measures such as blood draws, needle sticks, and frequent vital signs. [name] would like to [name] to continue Abx until he speaks to Dr. Florene Glen about this. We discussed starting [place] on some low dose around the clock opioids and benzodiazepines to control her symptoms. He was in favor of these measures.   Both Dr. Florene Glen and Nursing Staff informed of the switch to comfort measures.   A MOST  form was completed to reflect this change.   Cardiopulmonary Resuscitation: Do Not Attempt Resuscitation (DNR/No CPR)  Medical Interventions: Comfort Care  Antibiotics: Determine use of limitation of antibiotics when infection occurs  IV Fluids: No IV fluids (provide other measures to ensure comfort)   Feeding Tube: No feeding tube   Provided "Gone From My Site" booklet.  Decision Maker: Glenda [name] [phone]  SUMMARY OF RECOMMENDATIONS   DNAR/DNI  Comfort focused care  Unrestricted visitation  Symptom management per [facility] medications  Chaplain consultation  Ongoing PMT support  Code Status/Advance Care Planning: DNAR/DNI  Palliative Prophylaxis:   Oral Care, Turn Q2H, Pain management, delirium precautions  Additional Recommendations (Limitations, Scope, Preferences):  Comfort focused care  Psycho-social/Spiritual:   Desire for further Chaplaincy support: Yes - [facility]  Additional Recommendations: Education on end of life care   Prognosis: Poor limited to days  Discharge Planning: Discharge will likely be [place]  Vitals:   05/17/20 2059 05/18/20 0852  BP: (!) 138/96   Pulse: 71   Resp: 20   Temp: 97.8 F (36.6 C)   SpO2: 90% 95%    Intake/Output Summary (Last 24 hours) at 05/18/2020 1216 Last data filed at 05/18/2020 1023 Gross per 24 hour  Intake 350 ml  Output 400 ml  Net -50 ml   Last Weight  Most recent update: 05/05/2020  5:42 PM   Weight  51.7 kg (113 lb 15.7 oz)           Gen:  Frail elderly caucasian F in NAD HEENT: dry mucous membranes CV: Regular rate and irregular rhythm  PULM: 2LPM [place] ABD: soft/nontender  EXT: No edema  Neuro: Disoriented, fidgeting  PPS: 10%   This conversation/these recommendations were discussed with patient primary care team, Dr. Florene Glen  Time In: 1100 Time Out: 1210 Total Time: 70 Greater than 50%  of this time was spent counseling and coordinating care related to the above assessment and plan.  [place] Team Team Cell Phone: [phone] Please utilize secure chat with additional questions, if there is no response within 30 minutes please call the above phone number  Palliative Medicine Team providers are available by phone from 7am to 7pm daily  and can be reached through the team cell phone.  Should this patient require assistance outside of these hours, please call the patient's attending physician.

## 2020-05-18 NOTE — Evaluation (Signed)
Clinical/Bedside Swallow Evaluation Patient Details  Name: BEAUTIFULL CISAR MRN: [id number] Date of Birth: [birth year]  Today's Date: 05/18/2020 Time: SLP Start Time (ACUTE ONLY): 7353 SLP Stop Time (ACUTE ONLY): 0915 SLP Time Calculation (min) (ACUTE ONLY): 20 min  Past Medical History:  Past Medical History:  Diagnosis Date  . Aortic stenosis    Echo 02/2011 showing mild AS with normal LV systolic function  . Arthritis    "lower back" (10/09/2015)  . Atrial flutter with rapid ventricular response ([place]) 08/19/2016   s/p successful DCCV on 08/20/16, continue eliquis  . Chronic bronchitis ([place])    "off and on; several years" (10/09/2015)  . Chronic diastolic CHF (congestive heart failure) ([place])    a. 12/2015 Echo: EF 55-60%, mild AI/MR, mildly dil LA.  Marland Kitchen Chronic lower back pain   . Complication of anesthesia    "they had trouble waking me up after colon resection"  . Confusion    Originally listed as TIA-pt denies this hx on 10/09/2015 "it was the [place] I was taking; they had thought I was having a st originally roke"  . DDD (degenerative disc disease), lumbar    severe facet dz and adv DDD MRI L spine 2009  . Diverticulosis   . Dysrhythmia    A-Fib  . Femur fracture, right ([place]) 10/18/2018  . GERD (gastroesophageal reflux disease)   . Hiatal hernia    hx  . Hypercholesterolemia   . Hypertension   . Insomnia   . OA (osteoarthritis) of knee   . Paroxysmal atrial flutter ([place])    a. 09/2015 s/p TEE/DCCV;  b. CHA2DS2VASc = 5-->Eliquis.  . Peripheral edema   . PVCs (premature ventricular contractions)   . Sacral fracture ([place]) 11/06/2012  . Vertigo 11/19/2012   Past Surgical History:  Past Surgical History:  Procedure Laterality Date  . BREAST BIOPSY Left   . CARDIAC CATHETERIZATION  02/17/2001   MILD REGURGITATION. EF 60%  . CARDIOVERSION N/A 10/08/2015   Procedure: CARDIOVERSION;  Surgeon: Sanda Klein, MD;  Location: [facility];  Service: Cardiovascular;  Laterality: N/A;   . CARDIOVERSION N/A 08/20/2016   Procedure: Cardioversion;  Surgeon: Evans Lance, MD;  Location: [facility];  Service: Cardiovascular;  Laterality: N/A;  . CARDIOVERSION N/A 12/24/2019   Procedure: CARDIOVERSION;  Surgeon: Dorothy Spark, MD;  Location: [facility] [place];  Service: Cardiovascular;  Laterality: N/A;  . CATARACT EXTRACTION W/ INTRAOCULAR LENS  IMPLANT, BILATERAL Bilateral   . [place]  . COLECTOMY     related to "blockage"  . COLONOSCOPY    . EXCISIONAL HEMORRHOIDECTOMY    . HIP PINNING,CANNULATED Right 10/19/2018   Procedure: Right percutaneous hip pinning;  Surgeon: Erle Crocker, MD;  Location: [facility];  Service: Orthopedics;  Laterality: Right;  . INCISIONAL HERNIA REPAIR N/A 06/07/2019   Procedure: Reduction and Repair of Incisional Hernia with Overlay Mesh;  Surgeon: Kinsinger, Arta Bruce, MD;  Location: [facility];  Service: General;  Laterality: N/A;  . INGUINAL HERNIA REPAIR Bilateral   . IR KYPHO THORACIC WITH BONE BIOPSY  03/06/2020  . IR RADIOLOGIST EVAL & MGMT  02/07/2020  . IR VERTEBROPLASTY CERV/THOR BX INC UNI/BIL INC/INJECT/IMAGING  03/06/2020  . KYPHOPLASTY Right 09/11/2012   Procedure: Right Acrylic Sacroplasty;  Surgeon: Kristeen Miss, MD;  Location: [facility];  Service: Neurosurgery;  Laterality: Right;  Right  Acrylic Sacroplasty  . TEE WITHOUT CARDIOVERSION N/A 10/08/2015   Procedure: TRANSESOPHAGEAL ECHOCARDIOGRAM (TEE);  Surgeon: Sanda Klein, MD;  Location: [facility];  Service: Cardiovascular;  Laterality: N/A;  . TOTAL HIP ARTHROPLASTY Right 02/05/2019   Procedure: [place] TOTAL HIP ARTHROPLASTY ANTERIOR APPROACH AND REMOVAL OF SCREWS;  Surgeon: Frederik Pear, MD;  Location: [facility];  Service: Orthopedics;  Laterality: Right;  . TOTAL KNEE ARTHROPLASTY Right   . TOTAL MASTECTOMY Left 07/22/2017   Procedure: TOTAL MASTECTOMY;  Surgeon: Excell Seltzer, MD;  Location: [facility];  Service: General;  Laterality: Left;  . UMBILICAL  HERNIA REPAIR     HPI:  Patient is a 87 y.o. female with PMH: chronic pain syndrome, breast cancer s/p left mastectomy, Covid-19 infection(1/13; mild symptoms not needing hospitalization,) who presented to hospital with c/o SOB, decreased appetite, wheezing, intermittent nonproductive cough.   Assessment / Plan / Recommendation Clinical Impression  Patient presents with what appears to be a largely cognitive-based dysphagia. She is not fully alert, did allow SLP to perform extensive oral care. She would bite at and suck on toothette sponge during oral care and would then exhibit a throat clear, cough. No other PO's administered secondary to patient's level of alertness. RN and son both in room during evaluation and all in agreement that patient to remain NPO and focus on comfort, oral care and keeping her mouth moist. SLP Visit Diagnosis: Dysphagia, unspecified (R13.10)    Aspiration Risk  Severe aspiration risk;Risk for inadequate nutrition/hydration    Diet Recommendation NPO   Medication Administration: Via alternative means    Other  Recommendations Oral Care Recommendations: Oral care QID;Staff/trained caregiver to provide oral care   Follow up Recommendations Other (comment) (likely going comfort care)      Frequency and Duration   N/A         Prognosis   N/A     Swallow Study   General Date of Onset: 05/13/2020 HPI: Patient is a 87 y.o. female with PMH: chronic pain syndrome, breast cancer s/p left mastectomy, Covid-19 infection(1/13; mild symptoms not needing hospitalization,) who presented to hospital with c/o SOB, decreased appetite, wheezing, intermittent nonproductive cough. Type of Study: Bedside Swallow Evaluation Previous Swallow Assessment: None found Diet Prior to this Study: NPO Temperature Spikes Noted: No Respiratory Status: Nasal cannula History of Recent Intubation: No Behavior/Cognition: Lethargic/Drowsy;Requires cueing;Doesn't follow directions Oral  Cavity Assessment: Dry;Dried secretions Oral Care Completed by SLP: Yes Oral Cavity - Dentition: Adequate natural dentition Self-Feeding Abilities: Total assist Patient Positioning: Partially reclined Baseline Vocal Quality: Not observed Volitional Cough: Cognitively unable to elicit Volitional Swallow: Unable to elicit    Oral/Motor/Sensory Function Overall Oral Motor/Sensory Function: Other (comment) (unable to complete full oral motor exam, however patient did not appear with any facial or lingual assymetry or weakness)   Ice Chips     Thin Liquid Thin Liquid: Impaired Oral Phase Impairments: Poor awareness of bolus Pharyngeal  Phase Impairments: Throat Clearing - Delayed;Cough - Delayed Other Comments: patient sucked on small amount water from toothette sponge when SLP performing oral care    Nectar Thick Nectar Thick Liquid: Not tested   Honey Thick Honey Thick Liquid: Not tested   Puree Puree: Not tested   Solid     Solid: Not tested     Sonia Baller, MA, CCC-SLP Speech Therapy [facility] Acute Rehab

## 2020-05-19 DIAGNOSIS — J189 Pneumonia, unspecified organism: Secondary | ICD-10-CM | POA: Diagnosis not present

## 2020-05-19 DIAGNOSIS — Z515 Encounter for palliative care: Secondary | ICD-10-CM | POA: Diagnosis not present

## 2020-05-19 DIAGNOSIS — Z66 Do not resuscitate: Secondary | ICD-10-CM | POA: Diagnosis not present

## 2020-05-19 DIAGNOSIS — Z7189 Other specified counseling: Secondary | ICD-10-CM | POA: Diagnosis not present

## 2020-05-19 LAB — CULTURE, BLOOD (ROUTINE X 2)
Culture: NO GROWTH
Culture: NO GROWTH
Special Requests: ADEQUATE

## 2020-05-19 MED ORDER — LORAZEPAM 2 MG/ML IJ SOLN
1.0000 mg | INTRAMUSCULAR | Status: DC
  Administered 2020-05-19: 1 mg via INTRAVENOUS
  Filled 2020-05-19: qty 1

## 2020-05-19 MED ORDER — MORPHINE SULFATE (PF) 2 MG/ML IV SOLN
1.0000 mg | INTRAVENOUS | Status: DC
  Administered 2020-05-19: 1 mg via INTRAVENOUS
  Filled 2020-05-19: qty 1

## 2020-05-20 NOTE — Discharge Summary (Deleted)
Physician Discharge Summary  Glenda [name] XAJ:[id number] DOB: 06/05/1924 DOA: 04/26/2020  PCP: Marin Olp, MD  Admit date: 05/07/2020 Discharge date: 05-31-2020  Time spent: 40 minutes  Recommendations for Outpatient Follow-up:  1. Comfort measures per hospice   Discharge Diagnoses:  Principal Problem:   CAP (community acquired pneumonia) Active Problems:   Essential hypertension   Normocytic anemia   Osteoarthritis   Chronic diastolic HF (heart failure) (HCC)   Chronic pain syndrome   Anxiety   DNR (do not resuscitate)   Paroxysmal atrial fibrillation ([place])   Acute respiratory failure with hypoxia [facility])   Discharge Condition: guarded  Diet recommendation: comfort   Filed Weights   04/22/2020 1742 2020/05/31 0451  Weight: 51.7 kg 53.4 kg    History of present illness:  Glenda Lasota Robertsis Glenda [name] 87 y.o.femalewith medical history significant ofhypertension,aortic stenosis, atrial flutter on Eliquis,chronic pain syndrome,breast cancer status postleftmastectomy,and COVID-19 infection who presents with complaints of shortness of breath. She was admitted with pneumonia and started on antibiotics and steroids for wheezing.  She developed progressive delirium and follow up imaging showed progressive pneumonia despite antibiotics.  Palliative care was involved and Glenda [name] plan was made for comfort measures and transfer to hospice.   Hospital Course:  Goals of care: appreciate palliative care assistance.  Glenda [name] has been transitioned to comfort measures after Glenda [name] discussion with palliative care, which I feel is appropriate based on her progressive decline since admission.   Discussion with Glenda [name] this AM, he's agreeable and understands Comfort measures per palliative  Acute respiratory failure with hypoxia secondary to healthcare associated pneumonia  Aspiration Pneumonia  Acute Bronchitis: Requiring 2 L [place], continued increase WOB  CT chest with extensive diffuse right  lung and (to lesser degree) left lung ground glass and consolidative airspace opacities - discussed with rads, most consistent with multifocal pneumonia No hx hemoptysis abx d/c'd in setting of plan for comfort measures as noted above  Acute Metabolic Encephalopathy Likely hospital delirium - stop steroids Temazepam on hold given NPO  low dose IV ativan prn. Haldol prn. Head CT - mild chronic ischemic white matter disease SLP eval - pending VBG (no hypercarbia), B12 wnl, folate wnl, ammonia wnl Delirium precautions W/u further as indicated Plan for comfort measures as above  Essential hypertension:Blood pressures relatively stable. Home blood pressure medications include hydralazine 25 mg twice daily and amlodipine 2.5 mg daily. -Continue current regimen as tolerated - on hold given NPO status  Atrial flutter/atrial flutter with chronic anticoagulation: Patient with prior history of atrial flutter status post cardioversion back in 2018. On physical exam rate is Taesha Goodell regular heart rates appear to be less than 100. -Continue Eliquis (d/c with comfort measures)  Diastolic congestive heart failure: Chronic. Last echocardiogram from 2/23 noted EF to be 60 to 09% with diastolic parameters indeterminate. Patient otherwise appears to be euvolemic.  -Strict intake and output -Daily weights  Renal insufficiency secondary to dehydration:  Baseline creatinine appears to be ~0.9 Recently with AKI with creatinine as high as 1.48 on 2/17 prior to admission (reportedly had been on lasix at that time) - continue to monitor today, fluctuating creatining - I/O, daily weights  Normocytic anemia:  Relatively stable, follow  Recent Covid -19 infection:COVID-19 on 04/03/2020, butonly had mild symptoms at that time and did not require hospitalization or infusion. Patient was fully vaccinated with booster.  History of breast cancer: Patient with history of breast cancer to the left  breaststatus post mastectomy -Continue anastrozole -Continue outpatient follow-up  with Dr. Lindi Adie  Subclinical hypothyroidism: Last TSH was 5.76 on 05/08/2020. -Check free T4, TSH (wnl)  Chronic pain secondary to osteoarthritis -Continue current hydrocodone regimen with stool softeners  Anxiety -Continue Zoloft and Restoril (on hold as noted above)  Procedures:  none  Consultations:  none  Discharge Exam: Vitals:   05/18/20 1955 06-13-20 0745  BP: 137/65 (!) 142/59  Pulse: (!) 58 64  Resp: 14 14  Temp: (!) 97.4 F (36.3 C) (!) 97.5 F (36.4 C)  SpO2: 96%    See progress note from today  Discharge Instructions   Discharge Instructions    Discharge instructions   Complete by: As directed    You were treated for aspiration pneumonia and delirium (confusion) which did not respond well to antibiotic therapy.  We're planning for comfort measures and transfer to hospice.     Allergies as of 2020/06/13      Reactions   Ace Inhibitors Other (See Comments)   Cough   Halcion [triazolam]    UNSPECIFIED REACTION    Pentazocine Lactate    UNSPECIFIED REACTION    Sulfa Drugs Cross Reactors    UNSPECIFIED REACTION    Trazodone And Nefazodone Other (See Comments)   Per MAR   Amitriptyline Other (See Comments)   Hyperactivity   Avelox [moxifloxacin Hcl In Nacl] Other (See Comments)   dizziness   Penicillins Rash      Medication List    STOP taking these medications   amLODipine 2.5 MG tablet Commonly known as: NORVASC   anastrozole 1 MG tablet Commonly known as: ARIMIDEX   apixaban 2.5 MG Tabs tablet Commonly known as: Eliquis   CEROVITE ADVANCED FORMULA PO   Cholecalciferol 25 MCG (1000 UT) tablet   Cranberry 425 MG Caps   docusate sodium 100 MG capsule Commonly known as: COLACE   furosemide 20 MG tablet Commonly known as: LASIX   hydrALAZINE 25 MG tablet Commonly known as: APRESOLINE   HYDROcodone-acetaminophen 5-325 MG tablet Commonly known  as: NORCO/VICODIN   omeprazole 40 MG capsule Commonly known as: PRILOSEC   ondansetron 4 MG tablet Commonly known as: ZOFRAN   polyethylene glycol 17 g packet Commonly known as: MIRALAX / GLYCOLAX   sertraline 25 MG tablet Commonly known as: Zoloft   temazepam 15 MG capsule Commonly known as: RESTORIL     TAKE these medications   Artificial Tears 0.2-0.2-1 % Soln Generic drug: Glycerin-Hypromellose-PEG 400 Apply 1 drop to eye 4 (four) times daily.      Allergies  Allergen Reactions  . Ace Inhibitors Other (See Comments)    Cough  . Halcion [Triazolam]     UNSPECIFIED REACTION   . Pentazocine Lactate     UNSPECIFIED REACTION   . Sulfa Drugs Cross Reactors     UNSPECIFIED REACTION   . Trazodone And Nefazodone Other (See Comments)    Per MAR  . Amitriptyline Other (See Comments)    Hyperactivity  . Avelox [Moxifloxacin Hcl In Nacl] Other (See Comments)    dizziness  . Penicillins Rash      The results of significant diagnostics from this hospitalization (including imaging, microbiology, ancillary and laboratory) are listed below for reference.    Significant Diagnostic Studies: DG Chest 2 View  Result Date: 04/25/2020 CLINICAL DATA:  Cough EXAM: CHEST - 2 VIEW COMPARISON:  February 11, 2017 FINDINGS: The cardiomediastinal silhouette is unchanged and enlarged in contour.Atherosclerotic calcifications of the tortuous thoracic aorta. Revisualization of Kamica Florance LEFT-sided Bochdalek's hernia. Biapical pleuroparenchymal scarring. Trace bilateral  pleural effusions. Mild diffuse interstitial prominence. No pneumothorax. No focal consolidation. Visualized abdomen is unremarkable. Status post kyphoplasty for multiple midthoracic spine compression fracture deformities. IMPRESSION: 1. Mild diffuse interstitial prominence with small bilateral pleural effusions. This likely reflects mild underlying pulmonary edema. Electronically Signed   By: Valentino Saxon MD   On: 04/25/2020 14:25    CT HEAD WO CONTRAST  Result Date: 05/17/2020 CLINICAL DATA:  Delirium. EXAM: CT HEAD WITHOUT CONTRAST TECHNIQUE: Contiguous axial images were obtained from the base of the skull through the vertex without intravenous contrast. COMPARISON:  None. FINDINGS: Brain: Mild chronic ischemic white matter disease is noted. No mass effect or midline shift is noted. Ventricular size is within normal limits. There is no evidence of mass lesion, hemorrhage or acute infarction. Vascular: No hyperdense vessel or unexpected calcification. Skull: Normal. Negative for fracture or focal lesion. Sinuses/Orbits: No acute finding. Other: None. IMPRESSION: Mild chronic ischemic white matter disease. No acute intracranial abnormality seen. Electronically Signed   By: Marijo Conception M.D.   On: 05/17/2020 18:50   CT CHEST WO CONTRAST  Result Date: 05/17/2020 CLINICAL DATA:  Delirium, respiratory failure. EXAM: CT CHEST WITHOUT CONTRAST TECHNIQUE: Multidetector CT imaging of the chest was performed following the standard protocol without IV contrast. COMPARISON:  CT angio chest 02/11/2019, chest x-ray 05/17/2020, chest x-ray 05/16/2020, chest x-ray 05/09/2020 FINDINGS: Cardiovascular: Cardiomegaly. No significant pericardial effusion. The thoracic aorta is normal in caliber. Aortic root calcifications. At least moderate calcified and noncalcified atherosclerotic plaque of the thoracic aorta. At least mild three-vessel coronary artery calcifications. Mild mitral annular calcifications likely. Enlarged main pulmonary artery. Mediastinum/Nodes: No enlarged mediastinal or axillary lymph nodes. No gross hilar adenopathy, noting limited sensitivity for the detection of hilar adenopathy on this noncontrast study. Thyroid gland, trachea, and esophagus demonstrate no significant findings. Lungs/Pleura: Expiratory phase of respiration. Extensive diffuse right lung and to lesser degree left lung ground-glass and consolidative airspace  opacities. Small volume right pleural effusion. No pneumothorax. Markedly limited evaluation for pulmonary nodule and mass. Upper Abdomen: No acute abnormality. Musculoskeletal: No chest wall abnormality. No suspicious lytic or blastic osseous lesions. No acute displaced fracture. Multilevel degenerative changes of the spine with chronic midthoracic compression fracture status post kyphoplasty. IMPRESSION: 1. Extensive diffuse right lung and to lesser degree left lung ground-glass and consolidative airspace opacities that could represent Glenda [name] combination of infection versus inflammation (aspiration pneumonia) versus alveolar hemorrhage. Followup PA and lateral chest X-ray is recommended in 3-4 weeks following therapy to ensure resolution and exclude underlying malignancy. 2. Associated small volume right pleural effusion. 3. Enlarged main pulmonary artery likely representing pulmonary hypertension. 4. Cardiomegaly. 5.  Aortic Atherosclerosis (ICD10-I70.0). Electronically Signed   By: Iven Finn M.D.   On: 05/17/2020 18:59   DG CHEST PORT 1 VIEW  Result Date: 05/17/2020 CLINICAL DATA:  Shortness of breath. EXAM: PORTABLE CHEST 1 VIEW COMPARISON:  05/16/2020 FINDINGS: Stable moderate consolidation over the right upper lobe with slightly less volume loss. Mild interval worsening patchy density over the right mid to lower lung and possibly left lateral mid to upper lung and left retrocardiac region. No significant effusion. Mild stable cardiomegaly. Remainder the exam is unchanged. IMPRESSION: 1. Mild interval worsening bilateral airspace process likely multifocal pneumonia. 2. Stable cardiomegaly. Electronically Signed   By: Marin Olp M.D.   On: 05/17/2020 08:54   DG CHEST PORT 1 VIEW  Result Date: 05/16/2020 CLINICAL DATA:  Shortness of breath EXAM: PORTABLE CHEST 1 VIEW COMPARISON:  May 14, 2020  FINDINGS: There is persistent airspace opacity consistent with pneumonia in the right upper lobe. There  is slightly more volume loss in this area compared to recent study. There is now patchy infiltrate in the right and left lower lobe regions with ill-defined consolidation in Isahi Godwin portion of the left lower lobe. Heart is enlarged with pulmonary vascularity normal. There is aortic atherosclerosis. Status post midthoracic vertebral body kyphoplasty procedures. No adenopathy appreciable by radiography. IMPRESSION: Persistent apparent pneumonia right upper lobe with increase in volume loss in this area compared to recent study. New areas of infiltrate in both lower lobes consistent with progression of pneumonia. Stable cardiomegaly. Aortic Atherosclerosis (ICD10-I70.0). Electronically Signed   By: Lowella Grip III M.D.   On: 05/16/2020 15:57   DG Chest Port 1 View  Result Date: 04/24/2020 CLINICAL DATA:  sob EXAM: PORTABLE CHEST 1 VIEW COMPARISON:  04/24/2020 and prior. FINDINGS: New right upper lung consolidative opacity. No pneumothorax or pleural effusion. Cardiomegaly and tortuous thoracic aorta, similar to prior exam. Sequela of midthoracic kyphoplasty. IMPRESSION: Right upper lung consolidation concerning for infection. Electronically Signed   By: Primitivo Gauze M.D.   On: 04/26/2020 12:28   OCT, Retina - OU - Both Eyes  Result Date: 05/06/2020 Right Eye Quality was good. Central Foveal Thickness: 296. Progression has worsened. Findings include normal foveal contour, no IRF, retinal drusen , no SRF, pigment epithelial detachment, outer retinal atrophy (Mild interval increase in central PED). Left Eye Quality was good. Central Foveal Thickness: 283. Progression has worsened. Findings include normal foveal contour, no IRF, retinal drusen , outer retinal atrophy, pigment epithelial detachment, intraretinal hyper-reflective material, no SRF (Interval conversion of central PEDs to ORA). Notes *Images captured and stored on drive Diagnosis / Impression: NFP; no IRF OU +drusen/PED OU Nonexudative ARMD OU  OD: Mild interval increase in central PED and overlying ORA OS: Interval conversion of central PEDs to ORA Clinical management: See below Abbreviations: NFP - Normal foveal profile. CME - cystoid macular edema. PED - pigment epithelial detachment. IRF - intraretinal fluid. SRF - subretinal fluid. EZ - ellipsoid zone. ERM - epiretinal membrane. ORA - outer retinal atrophy. ORT - outer retinal tubulation. SRHM - subretinal hyper-reflective material. IRHM - intraretinal hyper-reflective material    Microbiology: Recent Results (from the past 240 hour(s))  Resp Panel by RT-PCR (Flu [employer]man&B, Covid) Nasopharyngeal Swab     Status: None   Collection Time: 05/13/2020 11:47 AM   Specimen: Nasopharyngeal Swab; Nasopharyngeal(NP) swabs in vial transport medium  Result Value Ref Range Status   SARS Coronavirus 2 by RT PCR NEGATIVE NEGATIVE Final    Comment: (NOTE) SARS-CoV-2 target nucleic acids are NOT DETECTED.  The SARS-CoV-2 RNA is generally detectable in upper respiratory specimens during the acute phase of infection. The lowest concentration of SARS-CoV-2 viral copies this assay can detect is 138 copies/mL. Aleyda Gindlesperger negative result does not preclude SARS-Cov-2 infection and should not be used as the sole basis for treatment or other patient management decisions. Dotty Gonzalo negative result may occur with  improper specimen collection/handling, submission of specimen other than nasopharyngeal swab, presence of viral mutation(s) within the areas targeted by this assay, and inadequate number of viral copies(<138 copies/mL). Sharay Bellissimo negative result must be combined with clinical observations, patient history, and epidemiological information. The expected result is Negative.  Fact Sheet for Patients:  EntrepreneurPulse.com.au  Fact Sheet for Healthcare Providers:  IncredibleEmployment.be  This test is no t yet approved or cleared by the [place] and  has been authorized  for  detection and/or diagnosis of SARS-CoV-2 by FDA under an Emergency Use Authorization (EUA). This EUA will remain  in effect (meaning this test can be used) for the duration of the COVID-19 declaration under Section 564(b)(1) of the Act, 21 U.S.C.section 360bbb-3(b)(1), unless the authorization is terminated  or revoked sooner.       Influenza Tonyia Marschall by PCR NEGATIVE NEGATIVE Final   Influenza B by PCR NEGATIVE NEGATIVE Final    Comment: (NOTE) The Xpert Xpress SARS-CoV-2/FLU/RSV plus assay is intended as an aid in the diagnosis of influenza from Nasopharyngeal swab specimens and should not be used as Alexandria Shiflett sole basis for treatment. Nasal washings and aspirates are unacceptable for Xpert Xpress SARS-CoV-2/FLU/RSV testing.  Fact Sheet for Patients: EntrepreneurPulse.com.au  Fact Sheet for Healthcare Providers: IncredibleEmployment.be  This test is not yet approved or cleared by the [place] FDA and has been authorized for detection and/or diagnosis of SARS-CoV-2 by FDA under an Emergency Use Authorization (EUA). This EUA will remain in effect (meaning this test can be used) for the duration of the COVID-19 declaration under Section 564(b)(1) of the Act, 21 U.S.C. section 360bbb-3(b)(1), unless the authorization is terminated or revoked.  Performed at [facility], [place] [address], [place], [place] [address]   Culture, blood (routine x 2)     Status: None (Preliminary result)   Collection Time: 05/18/2020 12:03 PM   Specimen: BLOOD RIGHT FOREARM  Result Value Ref Range Status   Specimen Description BLOOD RIGHT FOREARM  Final   Special Requests   Final    BOTTLES DRAWN AEROBIC AND ANAEROBIC Blood Culture results may not be optimal due to an excessive volume of blood received in culture bottles   Culture   Final    NO GROWTH 4 DAYS Performed at [facility], [place] [address], [place], [place] [address]    Report Status PENDING  Incomplete   Culture, blood (routine x 2)     Status: None (Preliminary result)   Collection Time: 05/06/2020 12:08 PM   Specimen: BLOOD RIGHT WRIST  Result Value Ref Range Status   Specimen Description BLOOD RIGHT WRIST  Final   Special Requests   Final    BOTTLES DRAWN AEROBIC AND ANAEROBIC Blood Culture adequate volume   Culture   Final    NO GROWTH 4 DAYS Performed at [facility], [place] [address], [place], [place] [address]    Report Status PENDING  Incomplete  MRSA PCR Screening     Status: None   Collection Time: 05/18/2020  5:11 PM   Specimen: Nasal Mucosa; Nasopharyngeal  Result Value Ref Range Status   MRSA by PCR NEGATIVE NEGATIVE Final    Comment:        The GeneXpert MRSA Assay (FDA approved for NASAL specimens only), is one component of Debarah Mccumbers comprehensive MRSA colonization surveillance program. It is not intended to diagnose MRSA infection nor to guide or monitor treatment for MRSA infections. Performed at [facility], [place] [address], [place], [place] [address]      Labs: Basic Metabolic Panel: Recent Labs  Lab 04/25/2020 1215 05/15/20 0859 05/16/20 0204 05/17/20 0058 05/18/20 0342  NA 143 144 142 142 144  K 4.4 4.7 4.4 4.3 3.9  CL 106 108 107 108 106  CO2 25 23 26 26 26   GLUCOSE 81 93 90 117* 71  BUN 37* 37* 37* 34* 37*  CREATININE 1.12* 1.06* 1.25* 0.95 1.10*  CALCIUM 8.7* 8.8* 8.8* 8.8*  8.8*  MG  --   --  2.4 2.4 2.3  PHOS  --   --  3.4 3.7 3.8   Liver Function Tests: Recent Labs  Lab 05/15/20 0859 05/16/20 0204 05/17/20 0058 05/18/20 0342  AST 26 24 25  45*  ALT 17 16 16 17   ALKPHOS 99 95 95 95  BILITOT 0.7 0.6 1.1 1.2  PROT 7.2 7.2 6.9 7.1  ALBUMIN 3.1* 3.1* 3.0* 3.1*   No results for input(s): LIPASE, AMYLASE in the last 168 hours. Recent Labs  Lab 05/17/20 1500  AMMONIA 16   CBC: Recent Labs  Lab 05/12/2020 1215 05/15/20 0859 05/16/20 0204 05/17/20 0058 05/18/20 0342  WBC 6.5 6.4 8.8 6.1 10.8*  NEUTROABS 5.3 5.3 7.3 5.4 9.6*   HGB 10.1* 10.2* 10.1* 10.2* 10.4*  HCT 32.5* 32.4* 32.2* 32.7* 33.4*  MCV 92.9 91.5 92.3 93.4 93.3  PLT 217 214 215 214 206   Cardiac Enzymes: No results for input(s): CKTOTAL, CKMB, CKMBINDEX, TROPONINI in the last 168 hours. BNP: BNP (last 3 results) Recent Labs    05/13/2020 1146 05/17/20 0058  BNP 290.3* 304.3*    ProBNP (last 3 results) Recent Labs    04/24/20 1558  PROBNP 180.0*    CBG: Recent Labs  Lab 05/16/20 2049  GLUCAP 134*       Signed:  Fayrene Helper MD.  [facility] 05-27-2020, 1:40 PM

## 2020-05-20 NOTE — Death Summary Note (Signed)
DEATH SUMMARY   Patient Details  Name: Glenda [name] MRN: [id number] DOB: 02-15-1925  Admission/Discharge Information   Admit Date:  03-Jun-2020  Date of Death: Date of Death: 06-08-20  Time of Death: Time of Death: 06-29-1340  Length of Stay: 5  Referring Physician: Marin Olp, MD   Reason(s) for Hospitalization  community acquired pneumonia  Diagnoses  Preliminary cause of death:  Secondary Diagnoses (including complications and co-morbidities):  Principal Problem:   CAP (community acquired pneumonia) Active Problems:   Essential hypertension   Normocytic anemia   Osteoarthritis   Chronic diastolic HF (heart failure) (HCC)   Chronic pain syndrome   Anxiety   DNR (do not resuscitate)   Paroxysmal atrial fibrillation ([place])   Acute respiratory failure with hypoxia [facility])   [place] [facility] Course (including significant findings, care, treatment, and services provided and events leading to death)  Glenda [name] is Glenda [name] 87 y.o. year old female who had Glenda [name] history of hypertension, aortic stenosis, Markala Sitts flutter on eliquis, chronic pain, breast cancer s/p L mastectomy, and hx of covid 19 infection who presented with shortness of breath and was found to have community acquired pneumonia.  She was admitted and started on antibiotics and steroids for wheezing.  She developed progressive delirium and follow up imaging showed progressive pneumonia despite antibiotics.  There was concern for aspiration and she was made NPO.  Palliative care was consulted and Lillyanne Bradburn plan was made for comfort measures.  She passed away on 06-08-22 with her son at bedside.  I spoke to him this evening and expressed my condolences.  He was appreciative of our care.  See previous notes for additional details.     Pertinent Labs and Studies  Significant Diagnostic Studies DG Chest 2 View  Result Date: 04/25/2020 CLINICAL DATA:  Cough EXAM: CHEST - 2 VIEW COMPARISON:  February 11, 2017 FINDINGS: The cardiomediastinal  silhouette is unchanged and enlarged in contour.Atherosclerotic calcifications of the tortuous thoracic aorta. Revisualization of Leontina Skidmore LEFT-sided Bochdalek's hernia. Biapical pleuroparenchymal scarring. Trace bilateral pleural effusions. Mild diffuse interstitial prominence. No pneumothorax. No focal consolidation. Visualized abdomen is unremarkable. Status post kyphoplasty for multiple midthoracic spine compression fracture deformities. IMPRESSION: 1. Mild diffuse interstitial prominence with small bilateral pleural effusions. This likely reflects mild underlying pulmonary edema. Electronically Signed   By: Valentino Saxon MD   On: 04/25/2020 14:25   CT HEAD WO CONTRAST  Result Date: 05/17/2020 CLINICAL DATA:  Delirium. EXAM: CT HEAD WITHOUT CONTRAST TECHNIQUE: Contiguous axial images were obtained from the base of the skull through the vertex without intravenous contrast. COMPARISON:  None. FINDINGS: Brain: Mild chronic ischemic white matter disease is noted. No mass effect or midline shift is noted. Ventricular size is within normal limits. There is no evidence of mass lesion, hemorrhage or acute infarction. Vascular: No hyperdense vessel or unexpected calcification. Skull: Normal. Negative for fracture or focal lesion. Sinuses/Orbits: No acute finding. Other: None. IMPRESSION: Mild chronic ischemic white matter disease. No acute intracranial abnormality seen. Electronically Signed   By: Marijo Conception M.D.   On: 05/17/2020 18:50   CT CHEST WO CONTRAST  Result Date: 05/17/2020 CLINICAL DATA:  Delirium, respiratory failure. EXAM: CT CHEST WITHOUT CONTRAST TECHNIQUE: Multidetector CT imaging of the chest was performed following the standard protocol without IV contrast. COMPARISON:  CT angio chest 02/11/2019, chest x-ray 05/17/2020, chest x-ray 05/16/2020, chest x-ray 06-03-20 FINDINGS: Cardiovascular: Cardiomegaly. No significant pericardial effusion. The thoracic aorta is normal in caliber. Aortic root  calcifications. At  least moderate calcified and noncalcified atherosclerotic plaque of the thoracic aorta. At least mild three-vessel coronary artery calcifications. Mild mitral annular calcifications likely. Enlarged main pulmonary artery. Mediastinum/Nodes: No enlarged mediastinal or axillary lymph nodes. No gross hilar adenopathy, noting limited sensitivity for the detection of hilar adenopathy on this noncontrast study. Thyroid gland, trachea, and esophagus demonstrate no significant findings. Lungs/Pleura: Expiratory phase of respiration. Extensive diffuse right lung and to lesser degree left lung ground-glass and consolidative airspace opacities. Small volume right pleural effusion. No pneumothorax. Markedly limited evaluation for pulmonary nodule and mass. Upper Abdomen: No acute abnormality. Musculoskeletal: No chest wall abnormality. No suspicious lytic or blastic osseous lesions. No acute displaced fracture. Multilevel degenerative changes of the spine with chronic midthoracic compression fracture status post kyphoplasty. IMPRESSION: 1. Extensive diffuse right lung and to lesser degree left lung ground-glass and consolidative airspace opacities that could represent Kevin Space combination of infection versus inflammation (aspiration pneumonia) versus alveolar hemorrhage. Followup PA and lateral chest X-ray is recommended in 3-4 weeks following therapy to ensure resolution and exclude underlying malignancy. 2. Associated small volume right pleural effusion. 3. Enlarged main pulmonary artery likely representing pulmonary hypertension. 4. Cardiomegaly. 5.  Aortic Atherosclerosis (ICD10-I70.0). Electronically Signed   By: Iven Finn M.D.   On: 05/17/2020 18:59   DG CHEST PORT 1 VIEW  Result Date: 05/17/2020 CLINICAL DATA:  Shortness of breath. EXAM: PORTABLE CHEST 1 VIEW COMPARISON:  05/16/2020 FINDINGS: Stable moderate consolidation over the right upper lobe with slightly less volume loss. Mild interval  worsening patchy density over the right mid to lower lung and possibly left lateral mid to upper lung and left retrocardiac region. No significant effusion. Mild stable cardiomegaly. Remainder the exam is unchanged. IMPRESSION: 1. Mild interval worsening bilateral airspace process likely multifocal pneumonia. 2. Stable cardiomegaly. Electronically Signed   By: Marin Olp M.D.   On: 05/17/2020 08:54   DG CHEST PORT 1 VIEW  Result Date: 05/16/2020 CLINICAL DATA:  Shortness of breath EXAM: PORTABLE CHEST 1 VIEW COMPARISON:  May 14, 2020 FINDINGS: There is persistent airspace opacity consistent with pneumonia in the right upper lobe. There is slightly more volume loss in this area compared to recent study. There is now patchy infiltrate in the right and left lower lobe regions with ill-defined consolidation in Charnika Herbst portion of the left lower lobe. Heart is enlarged with pulmonary vascularity normal. There is aortic atherosclerosis. Status post midthoracic vertebral body kyphoplasty procedures. No adenopathy appreciable by radiography. IMPRESSION: Persistent apparent pneumonia right upper lobe with increase in volume loss in this area compared to recent study. New areas of infiltrate in both lower lobes consistent with progression of pneumonia. Stable cardiomegaly. Aortic Atherosclerosis (ICD10-I70.0). Electronically Signed   By: Lowella Grip III M.D.   On: 05/16/2020 15:57   DG Chest Port 1 View  Result Date: 05/17/2020 CLINICAL DATA:  sob EXAM: PORTABLE CHEST 1 VIEW COMPARISON:  04/24/2020 and prior. FINDINGS: New right upper lung consolidative opacity. No pneumothorax or pleural effusion. Cardiomegaly and tortuous thoracic aorta, similar to prior exam. Sequela of midthoracic kyphoplasty. IMPRESSION: Right upper lung consolidation concerning for infection. Electronically Signed   By: Primitivo Gauze M.D.   On: 05/13/2020 12:28   OCT, Retina - OU - Both Eyes  Result Date: 05/06/2020 Right Eye  Quality was good. Central Foveal Thickness: 296. Progression has worsened. Findings include normal foveal contour, no IRF, retinal drusen , no SRF, pigment epithelial detachment, outer retinal atrophy (Mild interval increase in central PED). Left Eye Quality  was good. Central Foveal Thickness: 283. Progression has worsened. Findings include normal foveal contour, no IRF, retinal drusen , outer retinal atrophy, pigment epithelial detachment, intraretinal hyper-reflective material, no SRF (Interval conversion of central PEDs to ORA). Notes *Images captured and stored on drive Diagnosis / Impression: NFP; no IRF OU +drusen/PED OU Nonexudative ARMD OU OD: Mild interval increase in central PED and overlying ORA OS: Interval conversion of central PEDs to ORA Clinical management: See below Abbreviations: NFP - Normal foveal profile. CME - cystoid macular edema. PED - pigment epithelial detachment. IRF - intraretinal fluid. SRF - subretinal fluid. EZ - ellipsoid zone. ERM - epiretinal membrane. ORA - outer retinal atrophy. ORT - outer retinal tubulation. SRHM - subretinal hyper-reflective material. IRHM - intraretinal hyper-reflective material    Microbiology Recent Results (from the past 240 hour(s))  Resp Panel by RT-PCR (Flu Hensley Aziz&B, Covid) Nasopharyngeal Swab     Status: None   Collection Time: 05/18/2020 11:47 AM   Specimen: Nasopharyngeal Swab; Nasopharyngeal(NP) swabs in vial transport medium  Result Value Ref Range Status   SARS Coronavirus 2 by RT PCR NEGATIVE NEGATIVE Final    Comment: (NOTE) SARS-CoV-2 target nucleic acids are NOT DETECTED.  The SARS-CoV-2 RNA is generally detectable in upper respiratory specimens during the acute phase of infection. The lowest concentration of SARS-CoV-2 viral copies this assay can detect is 138 copies/mL. Regene Mccarthy negative result does not preclude SARS-Cov-2 infection and should not be used as the sole basis for treatment or other patient management decisions. Domonik Levario negative  result may occur with  improper specimen collection/handling, submission of specimen other than nasopharyngeal swab, presence of viral mutation(s) within the areas targeted by this assay, and inadequate number of viral copies(<138 copies/mL). Iqra Rotundo negative result must be combined with clinical observations, patient history, and epidemiological information. The expected result is Negative.  Fact Sheet for Patients:  EntrepreneurPulse.com.au  Fact Sheet for Healthcare Providers:  IncredibleEmployment.be  This test is no t yet approved or cleared by the [place] FDA and  has been authorized for detection and/or diagnosis of SARS-CoV-2 by FDA under an Emergency Use Authorization (EUA). This EUA will remain  in effect (meaning this test can be used) for the duration of the COVID-19 declaration under Section 564(b)(1) of the Act, 21 U.S.C.section 360bbb-3(b)(1), unless the authorization is terminated  or revoked sooner.       Influenza Shanaye Rief by PCR NEGATIVE NEGATIVE Final   Influenza B by PCR NEGATIVE NEGATIVE Final    Comment: (NOTE) The Xpert Xpress SARS-CoV-2/FLU/RSV plus assay is intended as an aid in the diagnosis of influenza from Nasopharyngeal swab specimens and should not be used as Braeson Rupe sole basis for treatment. Nasal washings and aspirates are unacceptable for Xpert Xpress SARS-CoV-2/FLU/RSV testing.  Fact Sheet for Patients: EntrepreneurPulse.com.au  Fact Sheet for Healthcare Providers: IncredibleEmployment.be  This test is not yet approved or cleared by the [place] FDA and has been authorized for detection and/or diagnosis of SARS-CoV-2 by FDA under an Emergency Use Authorization (EUA). This EUA will remain in effect (meaning this test can be used) for the duration of the COVID-19 declaration under Section 564(b)(1) of the Act, 21 U.S.C. section 360bbb-3(b)(1), unless the authorization is  terminated or revoked.  Performed at [facility], [place] [address] [place], [place] [address]   Culture, blood (routine x 2)     Status: None   Collection Time: 05/03/2020 12:03 PM   Specimen: BLOOD RIGHT FOREARM  Result Value Ref Range Status  Specimen Description BLOOD RIGHT FOREARM  Final   Special Requests   Final    BOTTLES DRAWN AEROBIC AND ANAEROBIC Blood Culture results may not be optimal due to an excessive volume of blood received in culture bottles   Culture   Final    NO GROWTH 5 DAYS Performed at [facility], [place] [address], [place], [place] [address]    Report Status May 23, 2020 FINAL  Final  Culture, blood (routine x 2)     Status: None   Collection Time: 05/04/2020 12:08 PM   Specimen: BLOOD RIGHT WRIST  Result Value Ref Range Status   Specimen Description BLOOD RIGHT WRIST  Final   Special Requests   Final    BOTTLES DRAWN AEROBIC AND ANAEROBIC Blood Culture adequate volume   Culture   Final    NO GROWTH 5 DAYS Performed at [facility], [place] [address] [place],  [address]    Report Status 2020/05/23 FINAL  Final  MRSA PCR Screening     Status: None   Collection Time: 05/13/2020  5:11 PM   Specimen: Nasal Mucosa; Nasopharyngeal  Result Value Ref Range Status   MRSA by PCR NEGATIVE NEGATIVE Final    Comment:        The GeneXpert MRSA Assay (FDA approved for NASAL specimens only), is one component of [facility] Kist[facility] comprehensive MRSA colonization surveillance program. It is not intended to diagnose MRSA infection nor to guide or monitor treatment for MRSA infections. Performed at [facility], [place] [address], [place],  [address]     Lab Basic Metabolic Panel: Recent Labs  Lab 05/08/2020 1215 05/15/20 0859 05/16/20 0204 05/17/20 0058 05/18/20 0342  NA 143 144 142 142 144  K 4.4 4.7 4.4 4.3 3.9  CL 106 108 107 108 106  CO2 25 23 26 26 26   GLUCOSE 81 93 90 117* 71  BUN 37* 37* 37* 34* 37*  CREATININE 1.12* 1.06* 1.25* 0.95  1.10*  CALCIUM 8.7* 8.8* 8.8* 8.8* 8.8*  MG  --   --  2.4 2.4 2.3  PHOS  --   --  3.4 3.7 3.8   Liver Function Tests: Recent Labs  Lab 05/15/20 0859 05/16/20 0204 05/17/20 0058 05/18/20 0342  AST 26 24 25  45*  ALT 17 16 16 17   ALKPHOS 99 95 95 95  BILITOT 0.7 0.6 1.1 1.2  PROT 7.2 7.2 6.9 7.1  ALBUMIN 3.1* 3.1* 3.0* 3.1*   No results for input(s): LIPASE, AMYLASE in the last 168 hours. Recent Labs  Lab 05/17/20 1500  AMMONIA 16   CBC: Recent Labs  Lab 05/11/2020 1215 05/15/20 0859 05/16/20 0204 05/17/20 0058 05/18/20 0342  WBC 6.5 6.4 8.8 6.1 10.8*  NEUTROABS 5.3 5.3 7.3 5.4 9.6*  HGB 10.1* 10.2* 10.1* 10.2* 10.4*  HCT 32.5* 32.4* 32.2* 32.7* 33.4*  MCV 92.9 91.5 92.3 93.4 93.3  PLT 217 214 215 214 206   Cardiac Enzymes: No results for input(s): CKTOTAL, CKMB, CKMBINDEX, TROPONINI in the last 168 hours. Sepsis Labs: Recent Labs  Lab 05/01/2020 1203 05/09/2020 1215 05/01/2020 1409 05/15/20 0859 05/16/20 0204 05/17/20 0058 05/18/20 0342  PROCALCITON  --   --   --   --   --  0.26 0.31  WBC  --    < >  --  6.4 8.8 6.1 10.8*  LATICACIDVEN 0.6  --  0.6  --   --   --   --    < > = values  in this interval not displayed.    Procedures/Operations  See previous notes   Fayrene Helper May 28, 2020, 7:40 PM

## 2020-05-20 NOTE — Progress Notes (Signed)
Manufacturing engineer [facility]) Hospital Liaison note.    Received request from [place] for family interest in [facility]. Chart reviewed and eligibility confirmed. Spoke with family to confirm interest and explain services. Family agreeable to transfer today. TOC aware.    [facility] will notify TOC when registration paperwork has been completed to arrange transport.   RN please call report to [phone]. Please be sure the signed [facility] DNR form transports with the patient.   Thank you for the opportunity to participate in this patients care.  Domenic Moras, BSN, RN [facility] Liaison (listed on [place] under Hosp[facility])    [phone] (24h on call)

## 2020-05-20 NOTE — Progress Notes (Signed)
Patient expired at 70.  Son was present at bedside at time of death.  MD ( Dr Florene Glen) was notified.  Postmortem charting completed.

## 2020-05-20 NOTE — Progress Notes (Signed)
   Palliative Medicine Inpatient Follow Up Note  Reason for consult:  Goals of Care  HPI:  Per intake H&P --> Glenda Z Robertsis a 87 y.o.femalewith medical history significant ofhypertension,aortic stenosis, atrial flutter on Eliquis,chronic pain syndrome,breast cancer status postleftmastectomy,and COVID-19 infection who presents with complaints of shortness of breath.  Palliative care has been asked to see Glenda [name] as she has been progressively declining since hospitalization.  Today's Discussion (26-May-2020):  *Please note that this is a verbal dictation therefore any spelling or grammatical errors are due to the "[place] One" system interpretation.  Chart reviewed. I met with Glenda [name] this morning. She was noted to have some signs of discomfort as indicated by her mouth twitching and hand fidgeting, therefore ativan dosage was increased. She was weaned off of O2.I spoke to patients bedside RN and informed her of the changes that had been pursued.    I met with Glenda [name]'s son, Glenda [name] later in the day. We both agreed that Glenda [name] transitioning to [facility] would be an appropriate next step. He shares that his father died in [facility] and that it was a pleasant experience.   I spoke to patients CSW, Glenda [name] who referred Glenda [name] to [employer] [facility].   Plan for transition once a bed is available.  Questions and concerns addressed   Objective Assessment: Vital Signs Vitals:   05/18/20 1955 05/26/2020 0745  BP: 137/65 (!) 142/59  Pulse: (!) 58 64  Resp: 14 14  Temp: (!) 97.4 F (36.3 C) (!) 97.5 F (36.4 C)  SpO2: 96%     Intake/Output Summary (Last 24 hours) at 05/26/20 1346 Last data filed at 05/18/2020 1446 Gross per 24 hour  Intake 100 ml  Output --  Net 100 ml   Last Weight  Most recent update: May 26, 2020  4:51 AM   Weight  53.4 kg (117 lb 11.6 oz)           Gen:  Frail elderly caucasian F in NAD HEENT: dry mucous membranes, oscillating chin CV: Regular  rate and irregular rhythm  PULM: 2LPM [place] ABD: soft/nontender  EXT: No edema  Neuro: Somnolent  SUMMARY OF RECOMMENDATIONS DNAR/DNI  Comfort focused care  Unrestricted visitation  Symptom management per MAR medications - Increase ATC ativan and morphine frequency  Chaplain consultation  Ongoing PMT support  Time Spent: 25 Greater than 50% of the time was spent in counseling and coordination of care ______________________________________________________________________________________ [place] Team Team Cell Phone: [phone] Please utilize secure chat with additional questions, if there is no response within 30 minutes please call the above phone number  Palliative Medicine Team providers are available by phone from 7am to 7pm daily and can be reached through the team cell phone.  Should this patient require assistance outside of these hours, please call the patient's attending physician.

## 2020-05-20 NOTE — Progress Notes (Signed)
This chaplain responded to PMT consult for EOL spiritual care.  The Pt. appears to be resting comfortably and is non-responsive to voice and touch. No visitors are in the room.  The chaplain is present at the Pt. bedside. The sun is coming through the window and creating a sacred space.  Prayer is shared with the Pt. reflecting her Mina Marble faith tradition.  This chaplain is available for F/U spiritual care as needed with the Pt. and family.

## 2020-05-20 NOTE — TOC Progression Note (Signed)
Transition of Care [facility]) - Progression Note    Patient Details  Name: Glenda [name] MRN: [id number] Date of Birth: 21-Jan-1925  Transition of Care [facility]) CM/SW [place], LCSW Phone Number: 05/22/20, 11:03 AM  Clinical Narrative:    CSW received residential hospice consult. CSW spoke with patient's son to offer choice. He reported agreement with hospice if that is what is best for patient, though he is also fine with her remaining at the hospital. His preference is for [facility] CSW sent referral to [facility] for review.    Expected Discharge Plan: [place] Barriers to Discharge: Hospice Bed not available  Expected Discharge Plan and Services Expected Discharge Plan: [place] In-house Referral: Hospice / Palliative Care Discharge Planning Services: CM Consult Post Acute Care Choice: Hospice Living arrangements for the past 2 months: Assisted Living Facility                           HH Arranged:  (ALF will arrange in house therapy) [place]: Hospice and [place] Date [facility] Agency Contacted: May 22, 2020 Time HH Agency Contacted: [phone]     Social Determinants of Health (SDOH) Interventions    Readmission Risk Interventions Readmission Risk Prevention Plan 05/16/2020 02/06/2019  Transportation Screening Complete Complete  PCP or Specialist Appt within 3-5 Days - Not Complete  Not Complete comments - DC date unknown but established with providers  [place] or [place] - Complete  Social Work Consult for [place] Planning/Counseling - Complete  Palliative Care Screening - Not Complete  Palliative Care Screening Not Complete Comments - no desire  Medication Review Press photographer) Referral to Pharmacy Referral to Pharmacy  PCP or Specialist appointment within 3-5 days of discharge Complete -  [place] or Home Care Consult Complete -  SW Recovery Care/Counseling Consult Complete -  [place] Not Applicable -  Some recent data might be hidden

## 2020-05-20 NOTE — Progress Notes (Incomplete)
Manufacturing engineer [facility]) Hospital Liaison Note:

## 2020-05-20 NOTE — Progress Notes (Signed)
PROGRESS NOTE    [name]  VFI:[id number] DOB: 07/04/1924 DOA: 05/17/2020 PCP: Marin Olp, MD   Chief Complaint  Patient presents with  . Shortness of Breath   Brief Narrative:  OMNI DUNSWORTH is Glenda [name] 87 y.o. female with medical history significant of hypertension, aortic stenosis, atrial flutter on Eliquis, chronic pain syndrome, breast cancer status post left mastectomy, and COVID-19 infection who presents with complaints of shortness of breath.  History is obtained from the patient with assistance of her son who is present at bedside who brought her hearing aids.  Over the last month patient reports that her breathing has not been well.  She had been diagnosed with COVID-19 on 04/03/2020, but only had mild symptoms at that time and did not require hospitalization or infusion.  She is fully vaccinated including her booster.  Her breathing became acutely worse over the last few days and she reports complaints of intermittent nonproductive cough, wheezing and lower extremity leg swelling.  Associated symptoms include decreased appetite, weight loss of 3 pounds, and increased thirst.  She had been being followed by her PCP and cardiology.  Due to elevated BNP she had been tried on increased dose of Lasix for Glenda [name] few days earlier this month.  Seen by cardiology yesterday due to persistent issues with her breathing. Cardiology suspected symptoms were more likely pulmonary related and decreased her amlodipine down to 2.5 mg daily, but did not check Mung Rinker chest x-ray at that time.  Patient also notes Kinzie Wickes tremor that had been present for over [name] month now.  She had been placed on amiodarone at the end of last year due to her history of atrial flutter, but this had been discontinued recently as it was thought to be worsening her respiratory status and tremor.  She lives in [facility] apartment and aides come in to help look after her.  Last night patient reported having subjective cold and hot chills,  but was never noted to have [name] fever when staff checked.  Staff at the facility noted patient have O2 saturation 70% on room air and placed on 2 L of nasal cannula oxygen with O2 sats 95%.  ED Course: Upon admission the emergency department patient was seen to be afebrile, pulse 52-61, and O2 saturations currently maintained on 2 L of nasal cannula oxygen.  Labs significant for globin 10.9, BUN 37, creatinine 1.12, high-sensitivity troponin III.  X-ray significant for right upper lobe opacity concerning for pneumonia.  Patient was started on Rocephin and azithromycin.  TRH called to admit.  Assessment & Plan:   Principal Problem:   CAP (community acquired pneumonia) Active Problems:   Essential hypertension   Normocytic anemia   Osteoarthritis   Chronic diastolic HF (heart failure) (HCC)   Chronic pain syndrome   Anxiety   DNR (do not resuscitate)   Paroxysmal atrial fibrillation (HCC)   Acute respiratory failure with hypoxia (HCC)  Goals of care: appreciate palliative care assistance.  [name] has been transitioned to comfort measures after Samariya Rockhold discussion with palliative care, which I feel is appropriate based on her progressive decline since admission.   Followed up with [name] this AM Comfort measures per palliative  Acute respiratory failure with hypoxia secondary to healthcare associated pneumonia  Aspiration Pneumonia  Acute Bronchitis:  Requiring 2 L [place], continued increase WOB  CT chest with extensive diffuse right lung and (to lesser degree) left lung ground glass and consolidative airspace opacities - discussed with rads, most  consistent with multifocal pneumonia No hx hemoptysis abx d/c'd in setting of plan for comfort measures as noted above  Acute Metabolic Encephalopathy Likely hospital delirium - stop steroids Temazepam on hold given NPO  low dose IV ativan prn. Haldol prn. Head CT - mild chronic ischemic white matter disease SLP eval - pending VBG (no  hypercarbia), B12 wnl, folate wnl, ammonia wnl Delirium precautions W/u further as indicated Plan for comfort measures as above  Essential hypertension: Blood pressures relatively stable.  Home blood pressure medications include hydralazine 25 mg twice daily and amlodipine 2.5 mg daily. -Continue current regimen as tolerated - on hold given NPO status  Atrial flutter/atrial flutter with chronic anticoagulation: Patient with prior history of atrial flutter status post cardioversion back in 2018.  On physical exam rate is Charly Holcomb regular heart rates appear to be less than 100. -Continue Eliquis (d/c with comfort measures)  Diastolic congestive heart failure: Chronic.  Last echocardiogram from 2/23 noted EF to be 60 to 16% with diastolic parameters indeterminate.  Patient otherwise appears to be euvolemic.  -Strict intake and output -Daily weights  Renal insufficiency secondary to dehydration:  Baseline creatinine appears to be ~0.9 Recently with AKI with creatinine as high as 1.48 on 2/17 prior to admission (reportedly had been on lasix at that time) - continue to monitor today, fluctuating creatining - I/O, daily weights  Normocytic anemia:  Relatively stable, follow  Recent Covid -19 infection: COVID-19 on 04/03/2020, but only had mild symptoms at that time and did not require hospitalization or infusion. Patient was fully vaccinated with booster.  History of breast cancer: Patient with history of breast cancer to the left breast status post mastectomy -Continue anastrozole -Continue outpatient follow-up with Dr. Lindi Adie  Subclinical hypothyroidism: Last TSH was 5.76 on 05/08/2020. -Check free T4, TSH (wnl)  Chronic pain secondary to osteoarthritis -Continue current hydrocodone regimen with stool softeners  Anxiety -Continue Zoloft and Restoril (on hold as noted above)   DVT prophylaxis: eliquis Code Status: DNR Family Communication: son over phone 2/28 Disposition:    Status is: Inpatient  Remains inpatient appropriate because:Inpatient level of care appropriate due to severity of illness   Dispo: The patient is from: [facility]              Anticipated d/c is to: pending              Anticipated d/c date is: > 3 days              Patient currently is not medically stable to d/c.   Difficult to place patient No       Consultants:   none  Procedures:   none  Antimicrobials:  Anti-infectives (From admission, onward)   Start     Dose/Rate Route Frequency Ordered Stop   05/18/20 1200  metroNIDAZOLE (FLAGYL) IVPB 500 mg        500 mg 100 mL/hr over 60 Minutes Intravenous Every 8 hours 05/18/20 1018     05/18/20 0900  azithromycin (ZITHROMAX) 500 mg in sodium chloride 0.9 % 250 mL IVPB        500 mg 250 mL/hr over 60 Minutes Intravenous Every 24 hours 05/18/20 0804 05/18/20 1023   05/17/20 1500  ceFEPIme (MAXIPIME) 2 g in sodium chloride 0.9 % 100 mL IVPB        2 g 200 mL/hr over 30 Minutes Intravenous Every 24 hours 05/17/20 1407     05/15/20 1200  cefTRIAXone (ROCEPHIN) 2 g in  sodium chloride 0.9 % 100 mL IVPB  Status:  Discontinued        2 g 200 mL/hr over 30 Minutes Intravenous Every 24 hours 05/17/2020 1452 05/17/20 1356   05/15/20 1200  azithromycin (ZITHROMAX) tablet 500 mg  Status:  Discontinued        500 mg Oral Daily 05/17/2020 1452 05/18/20 0804   05/01/2020 1245  cefTRIAXone (ROCEPHIN) 1 g in sodium chloride 0.9 % 100 mL IVPB        1 g 200 mL/hr over 30 Minutes Intravenous  Once 05/18/2020 1233 05/05/2020 1323   05/04/2020 1245  azithromycin (ZITHROMAX) 500 mg in sodium chloride 0.9 % 250 mL IVPB        500 mg 250 mL/hr over 60 Minutes Intravenous  Once 05/05/2020 1233 05/16/2020 1527         Subjective: Less resposnive today  Objective: Vitals:   05/18/20 1329 05/18/20 1955 06/09/20 0451 June 09, 2020 0745  BP: (!) 114/57 137/65  (!) 142/59  Pulse: 63 (!) 58  64  Resp: 12 14  14   Temp: 97.6 F (36.4 C) (!)  97.4 F (36.3 C)  (!) 97.5 F (36.4 C)  TempSrc: Axillary Oral  Axillary  SpO2: 96% 96%    Weight:   53.4 kg   Height:        Intake/Output Summary (Last 24 hours) at June 09, 2020 0908 Last data filed at 05/18/2020 1446 Gross per 24 hour  Intake 450 ml  Output 300 ml  Net 150 ml   Filed Weights   05/01/2020 1742 June 09, 2020 0451  Weight: 51.7 kg 53.4 kg    Examination:  General: appears more comfortable than previous days, but still slightly increased WOB Lungs: increased WOB Neurological: eyes open, doesn't respond meaningfully to verbal stimuli Skin: Warm and dry. No rashes or lesions. Extremities: No clubbing or cyanosis. No edema.    Data Reviewed: I have personally reviewed following labs and imaging studies  CBC: Recent Labs  Lab 04/27/2020 1215 05/15/20 0859 05/16/20 0204 05/17/20 0058 05/18/20 0342  WBC 6.5 6.4 8.8 6.1 10.8*  NEUTROABS 5.3 5.3 7.3 5.4 9.6*  HGB 10.1* 10.2* 10.1* 10.2* 10.4*  HCT 32.5* 32.4* 32.2* 32.7* 33.4*  MCV 92.9 91.5 92.3 93.4 93.3  PLT 217 214 215 214 741    Basic Metabolic Panel: Recent Labs  Lab 05/05/2020 1215 05/15/20 0859 05/16/20 0204 05/17/20 0058 05/18/20 0342  NA 143 144 142 142 144  K 4.4 4.7 4.4 4.3 3.9  CL 106 108 107 108 106  CO2 25 23 26 26 26   GLUCOSE 81 93 90 117* 71  BUN 37* 37* 37* 34* 37*  CREATININE 1.12* 1.06* 1.25* 0.95 1.10*  CALCIUM 8.7* 8.8* 8.8* 8.8* 8.8*  MG  --   --  2.4 2.4 2.3  PHOS  --   --  3.4 3.7 3.8    GFR: Estimated Creatinine Clearance: 24.1 mL/min (Caedon Bond) (by C-G formula based on SCr of 1.1 mg/dL (H)).  Liver Function Tests: Recent Labs  Lab 05/15/20 0859 05/16/20 0204 05/17/20 0058 05/18/20 0342  AST 26 24 25  45*  ALT 17 16 16 17   ALKPHOS 99 95 95 95  BILITOT 0.7 0.6 1.1 1.2  PROT 7.2 7.2 6.9 7.1  ALBUMIN 3.1* 3.1* 3.0* 3.1*    CBG: Recent Labs  Lab 05/16/20 2049  GLUCAP 134*     Recent Results (from the past 240 hour(s))  Resp Panel by RT-PCR (Flu Deray Dawes&B, Covid)  Nasopharyngeal Swab  Status: None   Collection Time: 04/23/2020 11:47 AM   Specimen: Nasopharyngeal Swab; Nasopharyngeal(NP) swabs in vial transport medium  Result Value Ref Range Status   SARS Coronavirus 2 by RT PCR NEGATIVE NEGATIVE Final    Comment: (NOTE) SARS-CoV-2 target nucleic acids are NOT DETECTED.  The SARS-CoV-2 RNA is generally detectable in upper respiratory specimens during the acute phase of infection. The lowest concentration of SARS-CoV-2 viral copies this assay can detect is 138 copies/mL. Nelvin Tomb negative result does not preclude SARS-Cov-2 infection and should not be used as the sole basis for treatment or other patient management decisions. Naithen Rivenburg negative result may occur with  improper specimen collection/handling, submission of specimen other than nasopharyngeal swab, presence of viral mutation(s) within the areas targeted by this assay, and inadequate number of viral copies(<138 copies/mL). Bohdan Macho negative result must be combined with clinical observations, patient history, and epidemiological information. The expected result is Negative.  Fact Sheet for Patients:  EntrepreneurPulse.com.au  Fact Sheet for Healthcare Providers:  IncredibleEmployment.be  This test is no t yet approved or cleared by the [place] FDA and  has been authorized for detection and/or diagnosis of SARS-CoV-2 by FDA under an Emergency Use Authorization (EUA). This EUA will remain  in effect (meaning this test can be used) for the duration of the COVID-19 declaration under Section 564(b)(1) of the Act, 21 U.S.C.section 360bbb-3(b)(1), unless the authorization is terminated  or revoked sooner.       Influenza Mckennah Kretchmer by PCR NEGATIVE NEGATIVE Final   Influenza B by PCR NEGATIVE NEGATIVE Final    Comment: (NOTE) The Xpert Xpress SARS-CoV-2/FLU/RSV plus assay is intended as an aid in the diagnosis of influenza from Nasopharyngeal swab specimens and should not be  used as Baylin Gamblin sole basis for treatment. Nasal washings and aspirates are unacceptable for Xpert Xpress SARS-CoV-2/FLU/RSV testing.  Fact Sheet for Patients: EntrepreneurPulse.com.au  Fact Sheet for Healthcare Providers: IncredibleEmployment.be  This test is not yet approved or cleared by the [place] FDA and has been authorized for detection and/or diagnosis of SARS-CoV-2 by FDA under an Emergency Use Authorization (EUA). This EUA will remain in effect (meaning this test can be used) for the duration of the COVID-19 declaration under Section 564(b)(1) of the Act, 21 U.S.C. section 360bbb-3(b)(1), unless the authorization is terminated or revoked.  Performed at [facility], [place] [address], [place], [place] [address]   Culture, blood (routine x 2)     Status: None (Preliminary result)   Collection Time: 05/12/2020 12:03 PM   Specimen: BLOOD RIGHT FOREARM  Result Value Ref Range Status   Specimen Description BLOOD RIGHT FOREARM  Final   Special Requests   Final    BOTTLES DRAWN AEROBIC AND ANAEROBIC Blood Culture results may not be optimal due to an excessive volume of blood received in culture bottles   Culture   Final    NO GROWTH 4 DAYS Performed at [facility], [place] [address], [place],  [address]    Report Status PENDING  Incomplete  Culture, blood (routine x 2)     Status: None (Preliminary result)   Collection Time: 04/29/2020 12:08 PM   Specimen: BLOOD RIGHT WRIST  Result Value Ref Range Status   Specimen Description BLOOD RIGHT WRIST  Final   Special Requests   Final    BOTTLES DRAWN AEROBIC AND ANAEROBIC Blood Culture adequate volume   Culture   Final    NO GROWTH 4 DAYS Performed at [facility],  [address], [place], [place] [address]    Report Status PENDING  Incomplete  MRSA PCR Screening     Status: None   Collection Time: 04/30/2020  5:11 PM   Specimen: Nasal Mucosa; Nasopharyngeal  Result Value  Ref Range Status   MRSA by PCR NEGATIVE NEGATIVE Final    Comment:        The GeneXpert MRSA Assay (FDA approved for NASAL specimens only), is one component of Corian Handley comprehensive MRSA colonization surveillance program. It is not intended to diagnose MRSA infection nor to guide or monitor treatment for MRSA infections. Performed at [facility], [place] [address], [place], [place] [address]          Radiology Studies: CT HEAD WO CONTRAST  Result Date: 05/17/2020 CLINICAL DATA:  Delirium. EXAM: CT HEAD WITHOUT CONTRAST TECHNIQUE: Contiguous axial images were obtained from the base of the skull through the vertex without intravenous contrast. COMPARISON:  None. FINDINGS: Brain: Mild chronic ischemic white matter disease is noted. No mass effect or midline shift is noted. Ventricular size is within normal limits. There is no evidence of mass lesion, hemorrhage or acute infarction. Vascular: No hyperdense vessel or unexpected calcification. Skull: Normal. Negative for fracture or focal lesion. Sinuses/Orbits: No acute finding. Other: None. IMPRESSION: Mild chronic ischemic white matter disease. No acute intracranial abnormality seen. Electronically Signed   By: Marijo Conception M.D.   On: 05/17/2020 18:50   CT CHEST WO CONTRAST  Result Date: 05/17/2020 CLINICAL DATA:  Delirium, respiratory failure. EXAM: CT CHEST WITHOUT CONTRAST TECHNIQUE: Multidetector CT imaging of the chest was performed following the standard protocol without IV contrast. COMPARISON:  CT angio chest 02/11/2019, chest x-ray 05/17/2020, chest x-ray 05/16/2020, chest x-ray 05/10/2020 FINDINGS: Cardiovascular: Cardiomegaly. No significant pericardial effusion. The thoracic aorta is normal in caliber. Aortic root calcifications. At least moderate calcified and noncalcified atherosclerotic plaque of the thoracic aorta. At least mild three-vessel coronary artery calcifications. Mild mitral annular calcifications likely. Enlarged  main pulmonary artery. Mediastinum/Nodes: No enlarged mediastinal or axillary lymph nodes. No gross hilar adenopathy, noting limited sensitivity for the detection of hilar adenopathy on this noncontrast study. Thyroid gland, trachea, and esophagus demonstrate no significant findings. Lungs/Pleura: Expiratory phase of respiration. Extensive diffuse right lung and to lesser degree left lung ground-glass and consolidative airspace opacities. Small volume right pleural effusion. No pneumothorax. Markedly limited evaluation for pulmonary nodule and mass. Upper Abdomen: No acute abnormality. Musculoskeletal: No chest wall abnormality. No suspicious lytic or blastic osseous lesions. No acute displaced fracture. Multilevel degenerative changes of the spine with chronic midthoracic compression fracture status post kyphoplasty. IMPRESSION: 1. Extensive diffuse right lung and to lesser degree left lung ground-glass and consolidative airspace opacities that could represent Jeanenne Licea combination of infection versus inflammation (aspiration pneumonia) versus alveolar hemorrhage. Followup PA and lateral chest X-ray is recommended in 3-4 weeks following therapy to ensure resolution and exclude underlying malignancy. 2. Associated small volume right pleural effusion. 3. Enlarged main pulmonary artery likely representing pulmonary hypertension. 4. Cardiomegaly. 5.  Aortic Atherosclerosis (ICD10-I70.0). Electronically Signed   By: Iven Finn M.D.   On: 05/17/2020 18:59        Scheduled Meds: . glycopyrrolate  0.4 mg Intravenous Q4H  . guaiFENesin  600 mg Oral BID  . hydroxypropyl methylcellulose / hypromellose  1 drop Both Eyes QID  . LORazepam  1 mg Intravenous Q4H  .  morphine injection  1 mg Intravenous Q4H  . pantoprazole (PROTONIX) IV  40 mg Intravenous  Q24H   Continuous Infusions: . ceFEPime (MAXIPIME) IV Stopped (05/18/20 1446)  . metronidazole 500 mg (06/06/2020 0422)     LOS: 5 days    Time spent: over 50  min    Fayrene Helper, MD [facility]   To contact the attending provider between 7A-7P or the covering provider during after hours 7P-7A, please log into the web site [email or web address] and access using universal [place] password for that web site. If you do not have the password, please call the hospital operator.  June 06, 2020, 9:08 AM

## 2020-05-20 DEATH — deceased

## 2020-05-29 ENCOUNTER — Ambulatory Visit: Payer: Medicare Other | Admitting: Cardiology

## 2020-06-10 ENCOUNTER — Telehealth: Payer: Medicare Other

## 2020-08-19 ENCOUNTER — Encounter (INDEPENDENT_AMBULATORY_CARE_PROVIDER_SITE_OTHER): Payer: Medicare Other | Admitting: Ophthalmology

## 2020-09-02 ENCOUNTER — Ambulatory Visit: Payer: Medicare Other | Admitting: Cardiology

## 2020-10-21 ENCOUNTER — Ambulatory Visit: Payer: Medicare Other | Admitting: Hematology and Oncology

## 2021-09-21 IMAGING — DX DG CHEST 1V PORT
1 series · 1 of 1 positions shown · non-contrast
Comparison: 04/24/2020 and prior.

CLINICAL DATA: sob

EXAM:
PORTABLE CHEST 1 VIEW

[chest ap]
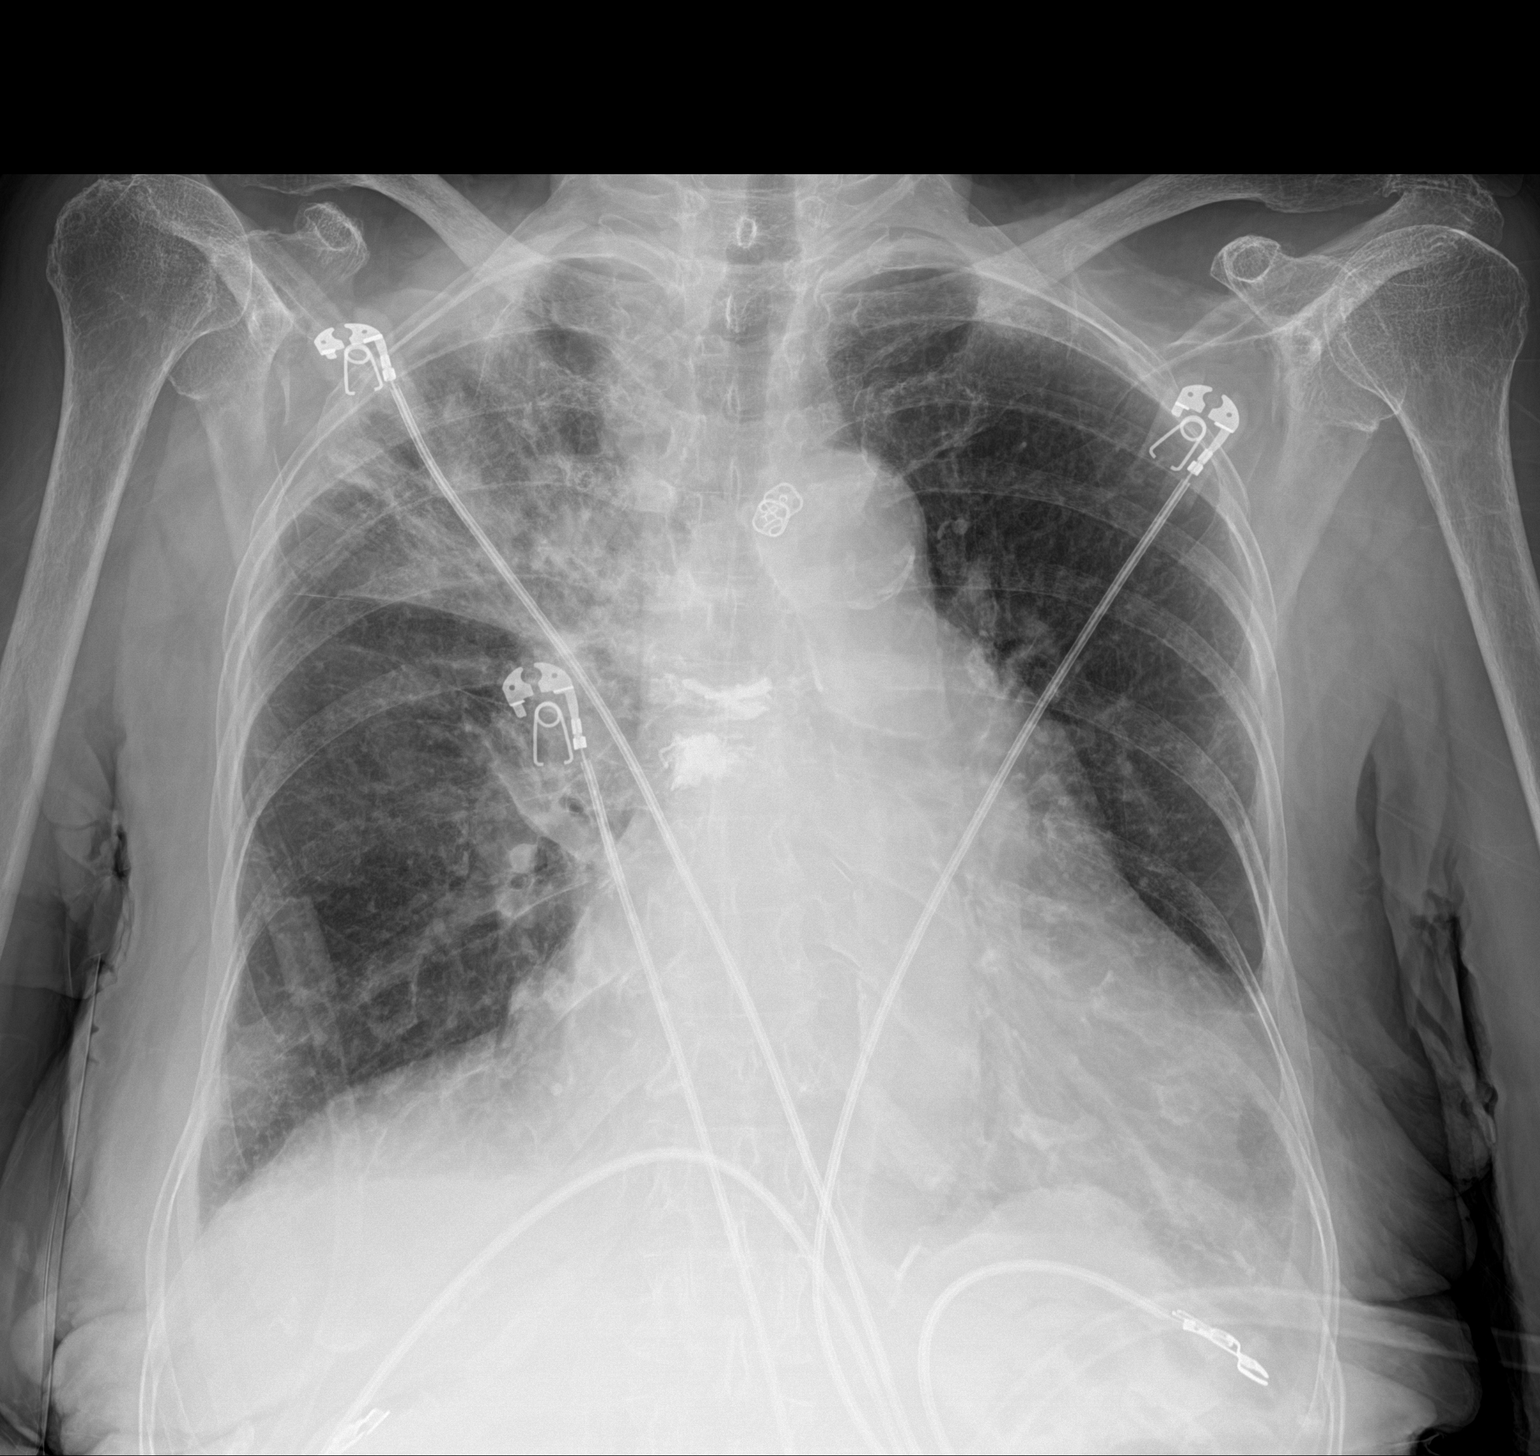

[1 of 1 positions shown; findings below may reference images not displayed]

FINDINGS: New right upper lung consolidative opacity. No pneumothorax or
pleural effusion. Cardiomegaly and tortuous thoracic aorta, similar
to prior exam. Sequela of midthoracic kyphoplasty.
IMPRESSION: Right upper lung consolidation concerning for infection.

## 2021-09-24 IMAGING — DX DG CHEST 1V PORT
1 series · 1 of 1 positions shown · non-contrast
Comparison: 05/16/2020

CLINICAL DATA: Shortness of breath.

EXAM:
PORTABLE CHEST 1 VIEW

[chest ap]
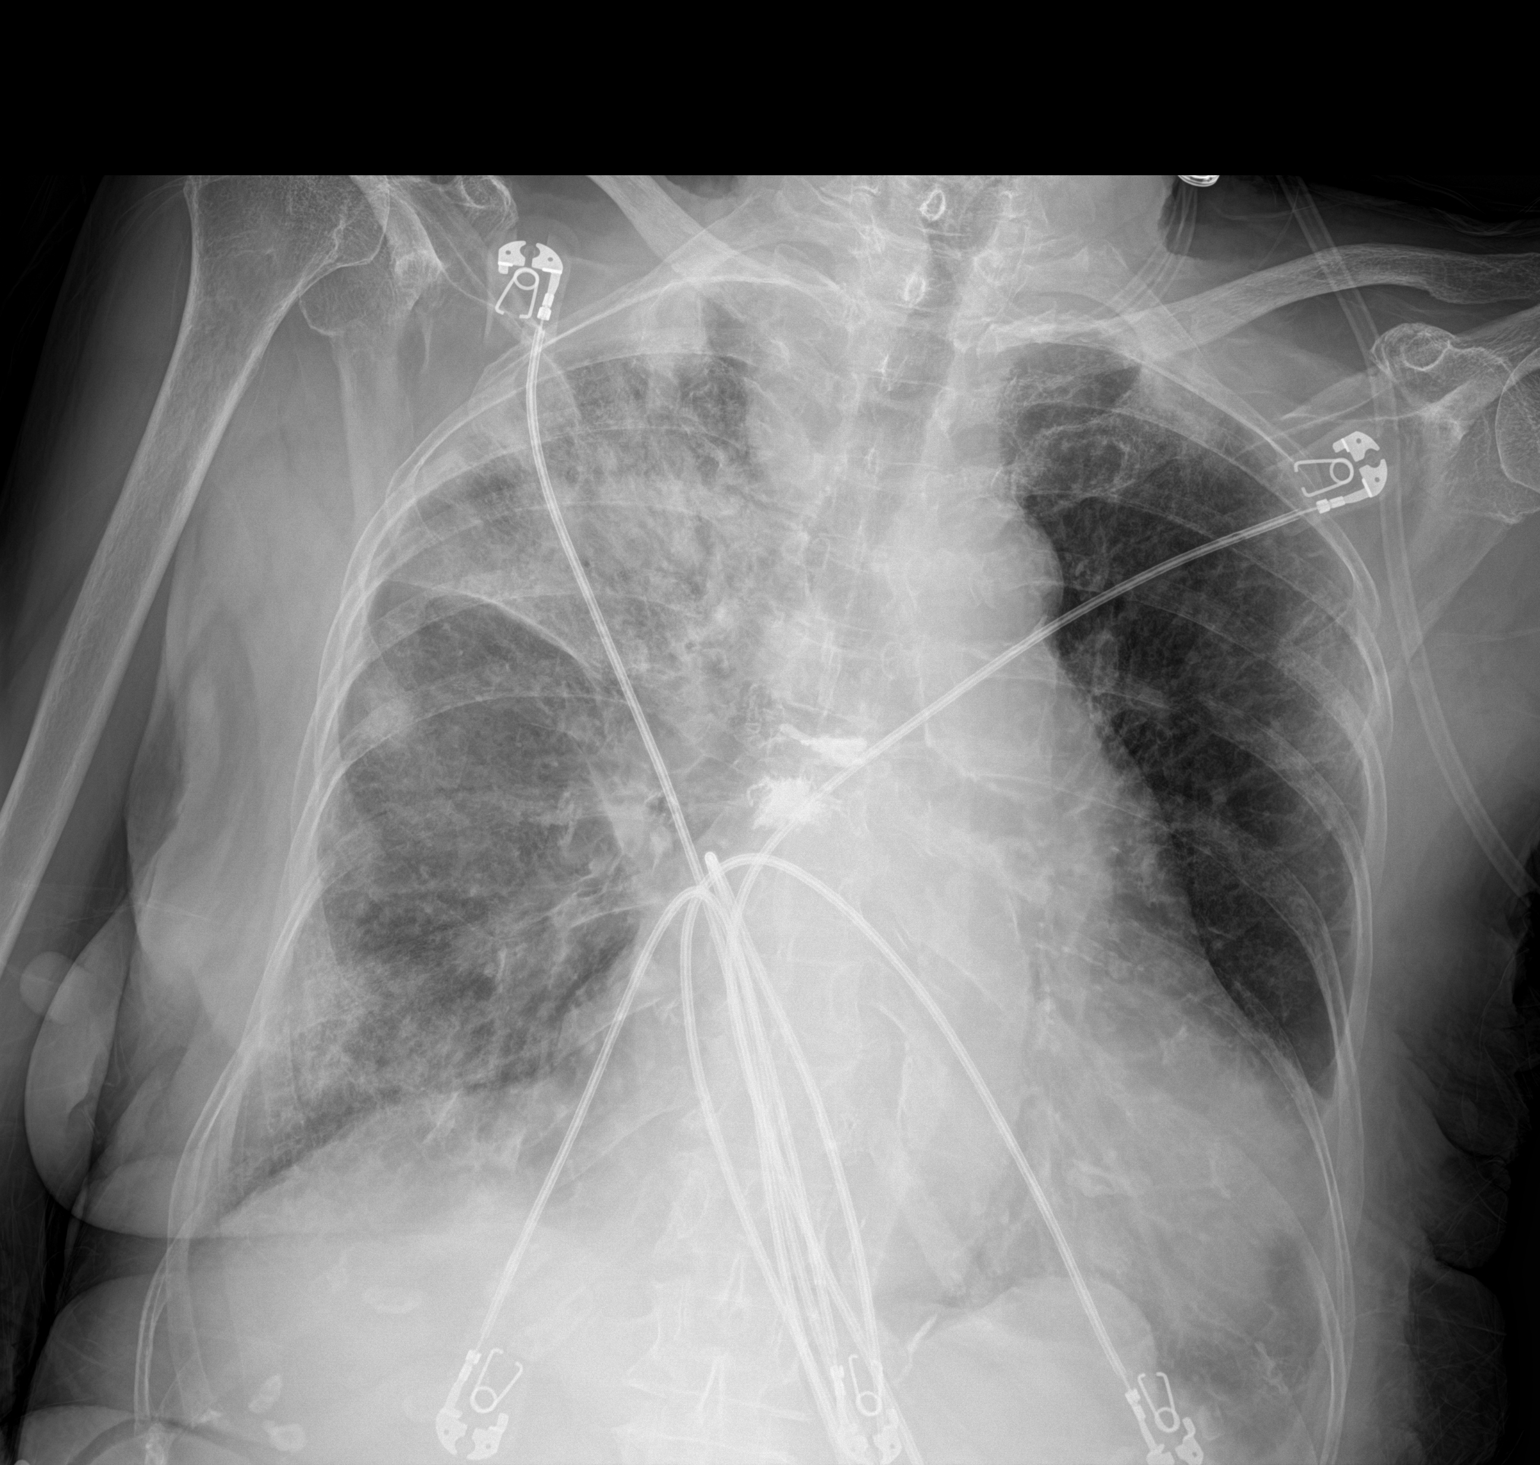

[1 of 1 positions shown; findings below may reference images not displayed]

FINDINGS: Stable moderate consolidation over the right upper lobe with
slightly less volume loss. Mild interval worsening patchy density
over the right mid to lower lung and possibly left lateral mid to
upper lung and left retrocardiac region. No significant effusion.
Mild stable cardiomegaly. Remainder the exam is unchanged.
IMPRESSION: 1. Mild interval worsening bilateral airspace process likely
multifocal pneumonia.
2. Stable cardiomegaly.
